# Patient Record
Sex: Female | Born: 1940 | Race: Black or African American | Hispanic: No | Smoking: Former smoker
Health system: Southern US, Community
[De-identification: ages and names within clinical notes are randomized; demographics above are authoritative.]

## PROBLEM LIST (undated history)

## (undated) DIAGNOSIS — C3490 Malignant neoplasm of unspecified part of unspecified bronchus or lung: Secondary | ICD-10-CM

## (undated) DIAGNOSIS — I272 Pulmonary hypertension, unspecified: Secondary | ICD-10-CM

## (undated) DIAGNOSIS — C55 Malignant neoplasm of uterus, part unspecified: Secondary | ICD-10-CM

## (undated) DIAGNOSIS — E119 Type 2 diabetes mellitus without complications: Secondary | ICD-10-CM

## (undated) DIAGNOSIS — J449 Chronic obstructive pulmonary disease, unspecified: Secondary | ICD-10-CM

## (undated) DIAGNOSIS — K5792 Diverticulitis of intestine, part unspecified, without perforation or abscess without bleeding: Secondary | ICD-10-CM

## (undated) DIAGNOSIS — K219 Gastro-esophageal reflux disease without esophagitis: Secondary | ICD-10-CM

## (undated) DIAGNOSIS — E7800 Pure hypercholesterolemia, unspecified: Secondary | ICD-10-CM

## (undated) DIAGNOSIS — I509 Heart failure, unspecified: Secondary | ICD-10-CM

## (undated) DIAGNOSIS — R0600 Dyspnea, unspecified: Secondary | ICD-10-CM

## (undated) HISTORY — PX: FIBEROPTIC BRONCHOSCOPY: SHX5367

## (undated) HISTORY — PX: ABDOMINAL HYSTERECTOMY: SHX81

## (undated) HISTORY — PX: COLONOSCOPY: SHX174

## (undated) HISTORY — DX: Type 2 diabetes mellitus without complications: E11.9

## (undated) HISTORY — DX: Malignant neoplasm of uterus, part unspecified: C55

## (undated) HISTORY — DX: Chronic obstructive pulmonary disease, unspecified: J44.9

## (undated) HISTORY — DX: Malignant neoplasm of unspecified part of unspecified bronchus or lung: C34.90

## (undated) HISTORY — PX: OTHER SURGICAL HISTORY: SHX169

## (undated) HISTORY — DX: Pure hypercholesterolemia, unspecified: E78.00

## (undated) HISTORY — DX: Diverticulitis of intestine, part unspecified, without perforation or abscess without bleeding: K57.92

## (undated) HISTORY — DX: Heart failure, unspecified: I50.9

## (undated) HISTORY — DX: Pulmonary hypertension, unspecified: I27.20

---
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
## 2011-02-05 DIAGNOSIS — I498 Other specified cardiac arrhythmias: Secondary | ICD-10-CM | POA: Insufficient documentation

## 2014-08-06 DIAGNOSIS — C549 Malignant neoplasm of corpus uteri, unspecified: Secondary | ICD-10-CM | POA: Insufficient documentation

## 2016-05-24 DIAGNOSIS — M8580 Other specified disorders of bone density and structure, unspecified site: Secondary | ICD-10-CM | POA: Insufficient documentation

## 2016-06-06 DIAGNOSIS — M7061 Trochanteric bursitis, right hip: Secondary | ICD-10-CM | POA: Insufficient documentation

## 2016-08-05 DIAGNOSIS — R202 Paresthesia of skin: Secondary | ICD-10-CM | POA: Insufficient documentation

## 2016-09-06 HISTORY — PX: OTHER SURGICAL HISTORY: SHX169

## 2016-10-21 DIAGNOSIS — H269 Unspecified cataract: Secondary | ICD-10-CM | POA: Insufficient documentation

## 2016-11-02 DIAGNOSIS — E278 Other specified disorders of adrenal gland: Secondary | ICD-10-CM | POA: Insufficient documentation

## 2016-11-18 DIAGNOSIS — D3500 Benign neoplasm of unspecified adrenal gland: Secondary | ICD-10-CM | POA: Insufficient documentation

## 2016-11-18 DIAGNOSIS — E042 Nontoxic multinodular goiter: Secondary | ICD-10-CM | POA: Insufficient documentation

## 2016-12-07 DIAGNOSIS — E079 Disorder of thyroid, unspecified: Secondary | ICD-10-CM | POA: Insufficient documentation

## 2017-08-23 LAB — HM DEXA SCAN

## 2018-03-17 LAB — HM MAMMOGRAPHY

## 2018-03-20 ENCOUNTER — Encounter: Payer: Self-pay | Admitting: Oncology

## 2018-03-20 ENCOUNTER — Encounter: Payer: Self-pay | Admitting: *Deleted

## 2018-03-21 ENCOUNTER — Other Ambulatory Visit: Payer: Self-pay

## 2018-03-21 ENCOUNTER — Encounter: Payer: Self-pay | Admitting: Oncology

## 2018-03-21 ENCOUNTER — Inpatient Hospital Stay: Payer: Medicare Other | Attending: Oncology | Admitting: Oncology

## 2018-03-21 ENCOUNTER — Encounter (INDEPENDENT_AMBULATORY_CARE_PROVIDER_SITE_OTHER): Payer: Self-pay

## 2018-03-21 VITALS — BP 144/78 | HR 80 | Temp 98.2°F | Resp 18 | Ht 63.5 in | Wt 165.8 lb

## 2018-03-21 DIAGNOSIS — Z85118 Personal history of other malignant neoplasm of bronchus and lung: Secondary | ICD-10-CM | POA: Diagnosis not present

## 2018-03-21 DIAGNOSIS — Z79899 Other long term (current) drug therapy: Secondary | ICD-10-CM | POA: Insufficient documentation

## 2018-03-21 DIAGNOSIS — Z8542 Personal history of malignant neoplasm of other parts of uterus: Secondary | ICD-10-CM

## 2018-03-21 DIAGNOSIS — J449 Chronic obstructive pulmonary disease, unspecified: Secondary | ICD-10-CM | POA: Insufficient documentation

## 2018-03-21 DIAGNOSIS — I272 Pulmonary hypertension, unspecified: Secondary | ICD-10-CM | POA: Diagnosis not present

## 2018-03-21 DIAGNOSIS — Z7984 Long term (current) use of oral hypoglycemic drugs: Secondary | ICD-10-CM | POA: Diagnosis not present

## 2018-03-21 DIAGNOSIS — E119 Type 2 diabetes mellitus without complications: Secondary | ICD-10-CM | POA: Diagnosis not present

## 2018-03-21 DIAGNOSIS — K5790 Diverticulosis of intestine, part unspecified, without perforation or abscess without bleeding: Secondary | ICD-10-CM | POA: Diagnosis not present

## 2018-03-21 DIAGNOSIS — E7800 Pure hypercholesterolemia, unspecified: Secondary | ICD-10-CM

## 2018-03-21 DIAGNOSIS — Z87891 Personal history of nicotine dependence: Secondary | ICD-10-CM | POA: Diagnosis not present

## 2018-03-21 DIAGNOSIS — R911 Solitary pulmonary nodule: Secondary | ICD-10-CM | POA: Diagnosis not present

## 2018-03-21 NOTE — Progress Notes (Signed)
Patient here for initial visit.

## 2018-03-21 NOTE — Progress Notes (Signed)
Hematology/Oncology Consult note [facility] [phone] [phone]   Patient Care Team: Patient, No Pcp Per as PCP - General (General Practice)  REFERRING PROVIDER: Dr.Edward Shaefer CHIEF COMPLAINTS/REASON FOR VISIT:  Evaluation of lung nodule and history of lung cancer.    HISTORY OF PRESENTING ILLNESS:  Brianna Adams is a  77 y.o.  female with PMH listed below who was referred to me for evaluation of.Lung nodule. Extensive review of patient's medical records that was faxed from [employer] 's office in [place]. 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  Patient reports that she did not receive any postoperative radiation therapy or chemotherapy. Patient follows up with pulmonologist Dr. Jeanella Cara in [place]. On 09/07/2017, patient had chest CT done which showed a new 4.3 mm nodule in the left upper lobe. Patient had a repeat CT scan on 02/10/2018 which showed a 1 cm nodule within the left upper lobe.  The nodule has increased in size comparing to previous study.  Patient had a PET scan done  Patient had a PET scan done in [place] on March 03, 2018 which showed hypermetabolic activity noted within the 1 cm nodule in the left upper lobe suspicious for malignancy.  Differential diagnosis includes primary neoplasm versus metastasis.  Status post resection of the right lower lobe nodule.  Diverticulosis COPD.  Patient has had pulmonary function study done in [place] on September 23, 2017. FEV-1/FVC percentage 84% which is normal and rule out airflow obstruction.  FEV1 1.72 LOr 121% of predicted FVC 2.05 L or 110% of predicted. Total lung capacity TLC 4.16 L or 98% of predicted. Residual volume 1.64 L or 92% of predicted Functional residual capacity 2.87 L or 125% of predicted DLCO  9.2 or 50% of predicted   Patient reports prior smoking history.  Quit smoking February 2018.  Previously smoked 1-1.5 PPD since 1959. Chronic mild shortness of breath.  Patient's daughter lives in [place] and patient would like to proceed radiation treatment close to daughter so daughter will take care of her.  She thought she is going to see Radiation Oncologist today as Dr.Shaefer gave her an impression that she will be treated with radiation.    Uterine cancer was listed as past medical history. She had abdominal hystectomy in 2016  Review of Systems  Constitutional: Negative for chills, fever, malaise/fatigue and weight loss.  HENT: Negative for nosebleeds and sore throat.   Eyes: Negative for double vision, photophobia and redness.  Respiratory: Positive for shortness of breath. Negative for cough and wheezing.   Cardiovascular: Negative for chest pain, palpitations and orthopnea.  Gastrointestinal: Negative for abdominal pain, blood in stool, nausea and vomiting.  Genitourinary: Negative for dysuria.  Musculoskeletal: Negative for back pain, myalgias and neck pain.  Skin: Negative for itching and rash.  Neurological: Negative for dizziness, tingling and tremors.  Endo/Heme/Allergies: Negative for environmental allergies. Does not bruise/bleed easily.  Psychiatric/Behavioral: Negative for depression.    MEDICAL HISTORY:  Past Medical History:  Diagnosis Date  . COPD (chronic obstructive pulmonary disease) ([place])   . Diabetes ([place])   . Diverticulitis   . High cholesterol   . Lung cancer ([place])   . Pulmonary hypertension ([place])   . Uterine cancer [facility])     SURGICAL HISTORY: Past Surgical History:  Procedure Laterality Date  .  ABDOMINAL HYSTERECTOMY    . cataract surgery  2012 and 2017  . FIBEROPTIC BRONCHOSCOPY    . resection of lung cancer  2018  . right lower lobe non-anatomocal lung resection wedge    . right thorascoscopy      SOCIAL HISTORY: Social  History   Socioeconomic History  . Marital status: Widowed    Spouse name: Not on file  . Number of children: Not on file  . Years of education: Not on file  . Highest education level: Not on file  Occupational History  . Occupation: retired  Scientific laboratory technician  . Financial resource strain: Not on file  . Food insecurity:    Worry: Not on file    Inability: Not on file  . Transportation needs:    Medical: Not on file    Non-medical: Not on file  Tobacco Use  . Smoking status: Former Smoker    Packs/day: 1.00    Years: 58.00    Pack years: 58.00    Types: Cigarettes    Last attempt to quit: 10/22/2015    Years since quitting: 2.4  . Smokeless tobacco: Never Used  Substance and Sexual Activity  . Alcohol use: Never    Frequency: Never  . Drug use: Never  . Sexual activity: Not on file  Lifestyle  . Physical activity:    Days per week: Not on file    Minutes per session: Not on file  . Stress: Not on file  Relationships  . Social connections:    Talks on phone: Not on file    Gets together: Not on file    Attends religious service: Not on file    Active member of club or organization: Not on file    Attends meetings of clubs or organizations: Not on file    Relationship status: Not on file  . Intimate partner violence:    Fear of current or ex partner: Not on file    Emotionally abused: Not on file    Physically abused: Not on file    Forced sexual activity: Not on file  Other Topics Concern  . Not on file  Social History Narrative  . Not on file    FAMILY HISTORY: Family History  Problem Relation Age of Onset  . Hypertension Mother   . Heart attack Father   . Cancer Maternal Aunt     ALLERGIES:  has no allergies on file.  MEDICATIONS:  Current Outpatient Medications  Medication Sig Dispense Refill  . acetaminophen (TYLENOL) 325 MG tablet Take 325 mg by mouth every 6 (six) hours as needed.    Marland Kitchen albuterol (VENTOLIN HFA) 108 (90 Base) MCG/ACT inhaler Inhale 2  puffs into the lungs every 4 (four) hours as needed for wheezing or shortness of breath.    Marland Kitchen CALCIUM PO Take 1,250 mg by mouth.    Marland Kitchen CINNAMON PO Take 1,000 mg by mouth.    Marland Kitchen glimepiride (AMARYL) 2 MG tablet Take 2 mg by mouth daily with breakfast.    . glucose blood test strip Contour Next Test Strips  Take 1 strip every day by miscell. route.    . Multiple Vitamins-Minerals (CENTRUM SILVER PO) Take by mouth.    . pravastatin (PRAVACHOL) 80 MG tablet Take 80 mg by mouth daily.    . sitaGLIPtin-metformin (JANUMET) 50-1000 MG tablet Take 1 tablet by mouth 2 (two) times daily with a meal.    . tiotropium (SPIRIVA HANDIHALER) 18 MCG inhalation capsule Spiriva with HandiHaler  18 mcg and inhalation capsules    . vitamin C (ASCORBIC ACID) 500 MG tablet Take 500 mg by mouth daily.     No current facility-administered medications for this visit.      PHYSICAL EXAMINATION: ECOG PERFORMANCE STATUS: 1 - Symptomatic but completely ambulatory Vitals:   03/21/18 1121  BP: (!) 144/78  Pulse: 80  Resp: 18  Temp: 98.2 F (36.8 C)   Filed Weights   03/21/18 1121  Weight: 165 lb 12.8 oz (75.2 kg)    Physical Exam  Constitutional: She is oriented to person, place, and time. She appears well-developed and well-nourished. No distress.  HENT:  Head: Normocephalic and atraumatic.  Mouth/Throat: Oropharynx is clear and moist.  Eyes: Pupils are equal, round, and reactive to light. Conjunctivae and EOM are normal. No scleral icterus.  Neck: Normal range of motion. Neck supple.  Cardiovascular: Normal rate, regular rhythm and normal heart sounds.  Pulmonary/Chest: Effort normal. No respiratory distress. She has no wheezes. She has no rales. She exhibits no tenderness.  Decreased breath sounds bilaterally.   Abdominal: Soft. Bowel sounds are normal. She exhibits no distension and no mass. There is no tenderness.  Musculoskeletal: Normal range of motion. She exhibits no edema or deformity.    Lymphadenopathy:    She has no cervical adenopathy.  Neurological: She is alert and oriented to person, place, and time. No cranial nerve deficit. Coordination normal.  Skin: Skin is warm and dry. No rash noted.  Psychiatric: She has a normal mood and affect. Her behavior is normal.     LABORATORY DATA:  I have reviewed the data as listed No results found for: WBC, HGB, HCT, MCV, PLT No results for input(s): NA, K, CL, CO2, GLUCOSE, BUN, CREATININE, CALCIUM, GFRNONAA, GFRAA, PROT, ALBUMIN, AST, ALT, ALKPHOS, BILITOT, BILIDIR, IBILI in the last 8760 hours. Iron/TIBC/Ferritin/ %Sat No results found for: IRON, TIBC, FERRITIN, IRONPCTSAT      ASSESSMENT & PLAN:  1. Lung nodule   2. History of lung cancer    # History of pT2 pN0 squamous lung cancer status post en bloc wedge resection. New lung nodule which is hypermetabolic on PET scan, concerning for a new primary or metastasis of her previous squamous cell lung carcinoma.  Per patient, she has requested images to be sent to [place] as well. Discussed with patient that I recommend lung biopsy if feasible to establish pathology diagnosShe should also be evaluated by thoracic surgeon to see if she is a candidate for resection. Patient tells me that she has decided to proceed with radiation based on her discussion with Dr. Kaleen Mask.  She request to be seen by radiation oncologist for evaluation.  Patient plans to receive radiation here and return to [place] after she completes treatment.   Will refer. We will discuss patient's case on tumor board.  Orders Placed This Encounter  Procedures  . Ambulatory referral to Radiation Oncology    Referral Priority:   Routine    Referral Type:   Consultation    Referral Reason:   Specialty Services Required    Referred to Provider:   Noreene Filbert, MD    Requested Specialty:   Radiation Oncology    Number of Visits Requested:   1    All questions were answered. The patient knows to call  the clinic with any problems questions or concerns.  Return of visit: As needed Total face to face encounter time for this patient visit was 60mn. >50% of the time  was  spent in counseling and coordination of care.    Earlie Server, MD, PhD Hematology Oncology [facility] at [facility] Pager- [phone] 03/21/2018

## 2018-03-29 ENCOUNTER — Encounter: Payer: Self-pay | Admitting: Radiation Oncology

## 2018-03-29 ENCOUNTER — Other Ambulatory Visit: Payer: Self-pay

## 2018-03-29 ENCOUNTER — Ambulatory Visit
Admission: RE | Admit: 2018-03-29 | Discharge: 2018-03-29 | Disposition: A | Discharge status: Home / Self Care | Payer: Medicare Other | Source: Ambulatory Visit | Attending: Radiation Oncology | Admitting: Radiation Oncology

## 2018-03-29 VITALS — BP 128/69 | HR 97 | Temp 97.5°F | Resp 20 | Wt 161.7 lb

## 2018-03-29 DIAGNOSIS — C3431 Malignant neoplasm of lower lobe, right bronchus or lung: Secondary | ICD-10-CM | POA: Insufficient documentation

## 2018-03-29 DIAGNOSIS — K5792 Diverticulitis of intestine, part unspecified, without perforation or abscess without bleeding: Secondary | ICD-10-CM | POA: Insufficient documentation

## 2018-03-29 DIAGNOSIS — E119 Type 2 diabetes mellitus without complications: Secondary | ICD-10-CM | POA: Insufficient documentation

## 2018-03-29 DIAGNOSIS — E7800 Pure hypercholesterolemia, unspecified: Secondary | ICD-10-CM | POA: Diagnosis not present

## 2018-03-29 DIAGNOSIS — R918 Other nonspecific abnormal finding of lung field: Secondary | ICD-10-CM | POA: Insufficient documentation

## 2018-03-29 DIAGNOSIS — R911 Solitary pulmonary nodule: Secondary | ICD-10-CM

## 2018-03-29 DIAGNOSIS — I272 Pulmonary hypertension, unspecified: Secondary | ICD-10-CM | POA: Insufficient documentation

## 2018-03-29 DIAGNOSIS — Z902 Acquired absence of lung [part of]: Secondary | ICD-10-CM | POA: Diagnosis not present

## 2018-03-29 DIAGNOSIS — J449 Chronic obstructive pulmonary disease, unspecified: Secondary | ICD-10-CM | POA: Insufficient documentation

## 2018-03-29 DIAGNOSIS — Z7984 Long term (current) use of oral hypoglycemic drugs: Secondary | ICD-10-CM | POA: Diagnosis not present

## 2018-03-29 DIAGNOSIS — Z8542 Personal history of malignant neoplasm of other parts of uterus: Secondary | ICD-10-CM | POA: Diagnosis not present

## 2018-03-29 DIAGNOSIS — Z880 Allergy status to penicillin: Secondary | ICD-10-CM | POA: Insufficient documentation

## 2018-03-29 DIAGNOSIS — Z79899 Other long term (current) drug therapy: Secondary | ICD-10-CM | POA: Diagnosis not present

## 2018-03-29 DIAGNOSIS — Z87891 Personal history of nicotine dependence: Secondary | ICD-10-CM | POA: Insufficient documentation

## 2018-03-29 NOTE — Consult Note (Signed)
NEW PATIENT EVALUATION  Name: Brianna Adams  MRN: [id number]  Date:   03/29/2018     DOB: 1941-04-09   This 77 y.o. female patient presents to the clinic for initial evaluation of rprobable new non-small cell lung cancer ofof the left upper lobe.  REFERRING PHYSICIAN: No ref. provider found  CHIEF COMPLAINT:  Chief Complaint  Patient presents with  . Lung Cancer    Pt is here for initial consultation of lung nodule    DIAGNOSIS: The encounter diagnosis was Lung nodule.   PREVIOUS INVESTIGATIONS:  PET CT scan requested for my review Pathology reports reviewed Clinical notes reviewed  HPI: patient is a3 year old female status post right middle lobe and right lower lobe stage TII N0 M0 squamous cell carcinoma. She had an en bloc wedge resection at the [facility] in May 2018. She did not receive adjuvant treatment. On follow-up she presented with a 4.3 mm nodule in left upper lobe which has grown over the past 6 months now a 1 cm nodule which is hypermetabolic on PET CT scan.she has palmar function tests from January 2019 showing FEV1 of 84% of predicted. She's been seen by medical oncology is now referred to radiation oncology for consideration of treatment. She is asymptomatic specifically denies cough hemoptysis or chest tightness. She's having no chest pain.  PLANNED TREATMENT REGIMEN: SB RT  PAST MEDICAL HISTORY:  has a past medical history of COPD (chronic obstructive pulmonary disease) ([place]), Diabetes ([place]), Diverticulitis, High cholesterol, Lung cancer ([place]), Pulmonary hypertension ([place]), and Uterine cancer ([place]).    PAST SURGICAL HISTORY:  Past Surgical History:  Procedure Laterality Date  . ABDOMINAL HYSTERECTOMY    . cataract surgery  2012 and 2017  . FIBEROPTIC BRONCHOSCOPY    . resection of lung cancer  2018  . right lower lobe non-anatomocal lung resection wedge    . right thorascoscopy      FAMILY HISTORY: family history includes Cancer in her maternal  aunt; Heart attack in her father; Hypertension in her mother.  SOCIAL HISTORY:  reports that she quit smoking about 2 years ago. Her smoking use included cigarettes. She has a 58.00 pack-year smoking history. She has never used smokeless tobacco. She reports that she does not drink alcohol or use drugs.  ALLERGIES: Ace inhibitors; Atorvastatin; Cortisone; Hydrocortisone; and Penicillins  MEDICATIONS:  Current Outpatient Medications  Medication Sig Dispense Refill  . acetaminophen (TYLENOL) 325 MG tablet Take 325 mg by mouth every 6 (six) hours as needed.    Marland Kitchen albuterol (VENTOLIN HFA) 108 (90 Base) MCG/ACT inhaler Inhale 2 puffs into the lungs every 4 (four) hours as needed for wheezing or shortness of breath.    Marland Kitchen CALCIUM PO Take 1,250 mg by mouth.    Marland Kitchen CINNAMON PO Take 1,000 mg by mouth.    Marland Kitchen glimepiride (AMARYL) 2 MG tablet Take 2 mg by mouth daily with breakfast.    . glucose blood test strip Contour Next Test Strips  Take 1 strip every day by miscell. route.    . Multiple Vitamins-Minerals (CENTRUM SILVER PO) Take by mouth.    . pravastatin (PRAVACHOL) 80 MG tablet Take 80 mg by mouth daily.    . sitaGLIPtin-metformin (JANUMET) 50-1000 MG tablet Take 1 tablet by mouth 2 (two) times daily with a meal.    . tiotropium (SPIRIVA HANDIHALER) 18 MCG inhalation capsule Spiriva with HandiHaler 18 mcg and inhalation capsules    . vitamin C (ASCORBIC ACID) 500 MG tablet Take 500 mg by  mouth daily.     No current facility-administered medications for this encounter.     ECOG PERFORMANCE STATUS:  0 - Asymptomatic  REVIEW OF SYSTEMS:  Patient denies any weight loss, fatigue, weakness, fever, chills or night sweats. Patient denies any loss of vision, blurred vision. Patient denies any ringing  of the ears or hearing loss. No irregular heartbeat. Patient denies heart murmur or history of fainting. Patient denies any chest pain or pain radiating to her upper extremities. Patient denies any shortness  of breath, difficulty breathing at night, cough or hemoptysis. Patient denies any swelling in the lower legs. Patient denies any nausea vomiting, vomiting of blood, or coffee ground material in the vomitus. Patient denies any stomach pain. Patient states has had normal bowel movements no significant constipation or diarrhea. Patient denies any dysuria, hematuria or significant nocturia. Patient denies any problems walking, swelling in the joints or loss of balance. Patient denies any skin changes, loss of hair or loss of weight. Patient denies any excessive worrying or anxiety or significant depression. Patient denies any problems with insomnia. Patient denies excessive thirst, polyuria, polydipsia. Patient denies any swollen glands, patient denies easy bruising or easy bleeding. Patient denies any recent infections, allergies or URI. Patient "s visual fields have not changed significantly in recent time.    PHYSICAL EXAM: BP 128/69   Pulse 97   Temp (!) 97.5 F (36.4 C)   Resp 20   Wt 161 lb 11.3 oz (73.4 kg)   BMI 28.20 kg/m  Well-developed well-nourished patient in NAD. HEENT reveals PERLA, EOMI, discs not visualized.  Oral cavity is clear. No oral mucosal lesions are identified. Neck is clear without evidence of cervical or supraclavicular adenopathy. Lungs are clear to A&P. Cardiac examination is essentially unremarkable with regular rate and rhythm without murmur rub or thrill. Abdomen is benign with no organomegaly or masses noted. Motor sensory and DTR levels are equal and symmetric in the upper and lower extremities. Cranial nerves II through XII are grossly intact. Proprioception is intact. No peripheral adenopathy or edema is identified. No motor or sensory levels are noted. Crude visual fields are within normal range.  LABORATORY DATA: pathology reports reviewed    RADIOLOGY RESULTS:PET CT scan results and disc have been requested for my review   IMPRESSION: rnew probable non-small  cell lung cancer of the left upper lobe in patient with en bloc resection for squamous cell carcinoma of the right lung in 2018  PLAN: this time like to review her PET CT scan we'll present her case at our weekly tumor conference. I would plan on delivering 6000 cGy in 5 fractions using SB RT. Based on the progression of disease and hypermetabolic activity do not feel biopsy is indicated. Risks and benefits of treatment including possible cough fatigue and skin reaction all were discussed in detail with the patient and her daughter. I have personally set up and ordered CT simulation in about a week's time toallow me to review her PET CT scan which is being shipped from [place]. Will use 4D study as well as possible PET CT CT fusion study in treatment planning.  I would like to take this opportunity to thank you for allowing me to participate in the care of your patient.Noreene Filbert, MD

## 2018-03-30 ENCOUNTER — Encounter: Payer: Self-pay | Admitting: *Deleted

## 2018-03-30 NOTE — Progress Notes (Signed)
  Oncology Nurse Navigator Documentation  Navigator Location: [facility] (03/30/18 0800) Referral date to RadOnc/MedOnc: 03/14/18 (03/30/18 0800) )Navigator Encounter Type: Initial RadOnc (03/30/18 0800)   Abnormal Finding Date: 03/03/18 (03/30/18 0800)                   Treatment Phase: Pre-Tx/Tx Discussion (03/30/18 0800) Barriers/Navigation Needs: Coordination of Care (03/30/18 0800)   Interventions: Coordination of Care (03/30/18 0800)   Coordination of Care: Appts;Radiology (03/30/18 0800)        Acuity: Level 1 (03/30/18 0800) Acuity Level 1: Initial guidance, education and coordination as needed;Minimal follow up required (03/30/18 0800)  met with patient during initial rad-onc consultation with Dr. Baruch Gouty. All questions answered at the time of visit. Pt informed that are still waiting on getting disc with PET scan images. Reassured patient that will call by the end of the week to have the disc FedEx overnight. Reviewed upcoming appts. Contact info given and instructed to call with any further questions or needs. Pt verbalized understanding.     Time Spent with Patient: 30 (03/30/18 0800)

## 2018-04-11 ENCOUNTER — Other Ambulatory Visit: Payer: Self-pay | Admitting: *Deleted

## 2018-04-11 ENCOUNTER — Ambulatory Visit
Admission: RE | Admit: 2018-04-11 | Discharge: 2018-04-11 | Disposition: A | Discharge status: Home / Self Care | Payer: Medicare Other | Source: Ambulatory Visit | Attending: Radiation Oncology | Admitting: Radiation Oncology

## 2018-04-11 ENCOUNTER — Encounter: Payer: Self-pay | Admitting: *Deleted

## 2018-04-11 ENCOUNTER — Ambulatory Visit
Admission: RE | Admit: 2018-04-11 | Discharge: 2018-04-11 | Disposition: A | Discharge status: Home / Self Care | Payer: Self-pay | Source: Ambulatory Visit | Attending: Oncology | Admitting: Oncology

## 2018-04-11 ENCOUNTER — Other Ambulatory Visit: Payer: Self-pay | Admitting: Oncology

## 2018-04-11 DIAGNOSIS — R911 Solitary pulmonary nodule: Secondary | ICD-10-CM

## 2018-04-11 DIAGNOSIS — Z87891 Personal history of nicotine dependence: Secondary | ICD-10-CM | POA: Insufficient documentation

## 2018-04-11 DIAGNOSIS — Z510 Encounter for antineoplastic radiation therapy: Secondary | ICD-10-CM | POA: Diagnosis not present

## 2018-04-11 DIAGNOSIS — Z85118 Personal history of other malignant neoplasm of bronchus and lung: Secondary | ICD-10-CM | POA: Insufficient documentation

## 2018-04-11 DIAGNOSIS — R918 Other nonspecific abnormal finding of lung field: Secondary | ICD-10-CM | POA: Diagnosis present

## 2018-04-11 DIAGNOSIS — J449 Chronic obstructive pulmonary disease, unspecified: Secondary | ICD-10-CM

## 2018-04-11 NOTE — Progress Notes (Signed)
  Oncology Nurse Navigator Documentation  Navigator Location: [facility] (04/11/18 1300)   )Navigator Encounter Type: Other (04/11/18 1300)                     Patient Visit Type: IAXKPV (04/11/18 1300) Treatment Phase: CT SIM (04/11/18 1300) Barriers/Navigation Needs: Coordination of Care (04/11/18 1300)   Interventions: Coordination of Care (04/11/18 1300)   Coordination of Care: Appts (04/11/18 1300)         met with patient's daughter during CT simulation today. All questions answered at the time of visit. Pt states would like to be referred to pulmonologist. Referral placed per Dr. Baruch Gouty and pt's daughter informed to be expecting phone call with appt details. Instructed to call with any further questions or needs. Pt's daughter verbalized understanding.         Time Spent with Patient: 30 (04/11/18 1300)

## 2018-04-17 DIAGNOSIS — Z510 Encounter for antineoplastic radiation therapy: Secondary | ICD-10-CM | POA: Diagnosis not present

## 2018-04-25 ENCOUNTER — Encounter: Payer: Self-pay | Admitting: *Deleted

## 2018-04-25 ENCOUNTER — Ambulatory Visit
Admission: RE | Admit: 2018-04-25 | Discharge: 2018-04-25 | Disposition: A | Discharge status: Home / Self Care | Payer: Medicare Other | Source: Ambulatory Visit | Attending: Radiation Oncology | Admitting: Radiation Oncology

## 2018-04-25 DIAGNOSIS — Z510 Encounter for antineoplastic radiation therapy: Secondary | ICD-10-CM | POA: Diagnosis not present

## 2018-04-26 ENCOUNTER — Encounter: Payer: Self-pay | Admitting: Pulmonary Disease

## 2018-04-26 ENCOUNTER — Other Ambulatory Visit
Admission: RE | Admit: 2018-04-26 | Discharge: 2018-04-26 | Disposition: A | Discharge status: Home / Self Care | Payer: Medicare Other | Source: Ambulatory Visit | Attending: Gastroenterology | Admitting: Gastroenterology

## 2018-04-26 ENCOUNTER — Other Ambulatory Visit
Admission: RE | Admit: 2018-04-26 | Discharge: 2018-04-26 | Disposition: A | Discharge status: Home / Self Care | Payer: Medicare Other | Source: Ambulatory Visit | Attending: Pulmonary Disease | Admitting: Pulmonary Disease

## 2018-04-26 ENCOUNTER — Ambulatory Visit (INDEPENDENT_AMBULATORY_CARE_PROVIDER_SITE_OTHER): Payer: Medicare Other | Admitting: Pulmonary Disease

## 2018-04-26 VITALS — BP 140/80 | HR 88 | Ht 63.5 in | Wt 162.0 lb

## 2018-04-26 DIAGNOSIS — J439 Emphysema, unspecified: Secondary | ICD-10-CM | POA: Diagnosis not present

## 2018-04-26 DIAGNOSIS — K921 Melena: Secondary | ICD-10-CM | POA: Diagnosis present

## 2018-04-26 DIAGNOSIS — Z902 Acquired absence of lung [part of]: Secondary | ICD-10-CM | POA: Diagnosis not present

## 2018-04-26 DIAGNOSIS — R0600 Dyspnea, unspecified: Secondary | ICD-10-CM

## 2018-04-26 DIAGNOSIS — J449 Chronic obstructive pulmonary disease, unspecified: Secondary | ICD-10-CM | POA: Diagnosis not present

## 2018-04-26 DIAGNOSIS — R059 Cough, unspecified: Secondary | ICD-10-CM

## 2018-04-26 DIAGNOSIS — R0609 Other forms of dyspnea: Secondary | ICD-10-CM | POA: Insufficient documentation

## 2018-04-26 DIAGNOSIS — R05 Cough: Secondary | ICD-10-CM

## 2018-04-26 DIAGNOSIS — Z85118 Personal history of other malignant neoplasm of bronchus and lung: Secondary | ICD-10-CM | POA: Diagnosis not present

## 2018-04-26 LAB — CBC WITH DIFFERENTIAL/PLATELET
Basophils Absolute: 0.1 10*3/uL (ref 0–0.1)
Basophils Relative: 1 %
EOS ABS: 0.2 10*3/uL (ref 0–0.7)
EOS PCT: 4 %
HCT: 35.4 % (ref 35.0–47.0)
Hemoglobin: 11.8 g/dL — ABNORMAL LOW (ref 12.0–16.0)
LYMPHS ABS: 1.8 10*3/uL (ref 1.0–3.6)
Lymphocytes Relative: 30 %
MCH: 27.4 pg (ref 26.0–34.0)
MCHC: 33.4 g/dL (ref 32.0–36.0)
MCV: 81.9 fL (ref 80.0–100.0)
Monocytes Absolute: 0.7 10*3/uL (ref 0.2–0.9)
Monocytes Relative: 11 %
Neutro Abs: 3.4 10*3/uL (ref 1.4–6.5)
Neutrophils Relative %: 54 %
PLATELETS: 268 10*3/uL (ref 150–440)
RBC: 4.33 MIL/uL (ref 3.80–5.20)
RDW: 17.4 % — AB (ref 11.5–14.5)
WBC: 6.2 10*3/uL (ref 3.6–11.0)

## 2018-04-26 LAB — IRON AND TIBC
IRON: 45 ug/dL (ref 28–170)
Saturation Ratios: 10 % — ABNORMAL LOW (ref 10.4–31.8)
TIBC: 447 ug/dL (ref 250–450)
UIBC: 402 ug/dL

## 2018-04-26 LAB — BRAIN NATRIURETIC PEPTIDE: B Natriuretic Peptide: 57 pg/mL (ref 0.0–100.0)

## 2018-04-26 MED ORDER — OMEPRAZOLE 40 MG PO CPDR: 40.0000 mg | DELAYED_RELEASE_CAPSULE | Freq: Every day | ORAL | 10 refills | Status: DC

## 2018-04-26 MED ORDER — ALBUTEROL SULFATE HFA 108 (90 BASE) MCG/ACT IN AERS: 1.0000 | INHALATION_SPRAY | RESPIRATORY_TRACT | 5 refills | Status: DC | PRN

## 2018-04-26 MED ORDER — UMECLIDINIUM-VILANTEROL 62.5-25 MCG/INH IN AEPB: 1.0000 | INHALATION_SPRAY | Freq: Every day | RESPIRATORY_TRACT | 5 refills | Status: DC

## 2018-04-26 NOTE — Patient Instructions (Signed)
For shortness of breath:  Trial of change from Spiriva inhaler to Anoro inhaler.  Stop Spiriva.  Anoro, 1 inhalation daily.  Trial of albuterol rescue inhaler, 1-2 inhalations up to every 4-6 hours as needed for increased shortness of breath, cough, wheezing, chest tightness.  May use albuterol inhaler prior to exercise.  Lung function tests (PFTs) ordered   Blood test today: B natruretic peptide.  This is to screen for congestive heart failure  For cough: Omeprazole (Prilosec) 40 mg daily to be taken in the morning  Follow-up in 4 to 6 weeks if you remain in this area.  If not, contact us to obtain a copy of my note from this encounter to take back with you to your pulmonologist in [place]

## 2018-04-26 NOTE — Progress Notes (Signed)
PULMONARY CONSULT NOTE  Requesting MD/Service: Tasia Catchings Date of initial consultation: 04/26/18 Reason for consultation: History of lung cancer involving RML/RLL, status post resection, LUL lung nodule (PET positive, presumed malignant, XRT recently initiated).  Exertional dyspnea  PT PROFILE: 77 y.o. 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  Underwent en bloc resection in [place].  New finding of LUL nodule, PET positive.  Radiation therapy initiated.  Referred for evaluation of exertional dyspnea.  DATA: PET 04/11/18: Mild emphysematous changes.  Postoperative changes noted.  Hypermetabolic LUL nodule  INTERVAL:  HPI:  As above.  She reports exertional dyspnea with little day-to-day variation.  She denies CP, fever, purulent sputum, hemoptysis, LE edema and calf tenderness.  She does note cough after eating which is mostly nonproductive.  She reports a globus sensation.  She denies nocturnal cough.  She has been prescribed a Spiriva inhaler by her pulmonologist in [place].  She is unable to discern any benefit from this medication.  She has no rescue inhaler.  Per Dr. Collie Siad note, PFTs were performed in [place] in January of this year which failed to demonstrate significant obstruction.  The only major abnormality was decreased DLCO at 50% predicted.  The patient reports that she intends to return to [place] after completion of radiation therapy here.  She has come to [place] to be supported by her daughter while she is undergoing radiation therapy.  Past Medical History:  Diagnosis Date  . COPD (chronic obstructive pulmonary disease) ([place])   . Diabetes ([place])   . Diverticulitis   . High cholesterol   . Lung cancer ([place])   . Pulmonary hypertension ([place])   . Uterine cancer [facility])     Past Surgical History:  Procedure Laterality Date  . ABDOMINAL HYSTERECTOMY    . cataract surgery  2012 and 2017   . FIBEROPTIC BRONCHOSCOPY    . resection of lung cancer  2018  . right lower lobe non-anatomocal lung resection wedge    . right thorascoscopy      MEDICATIONS: I have reviewed all medications and confirmed regimen as documented  Social History   Socioeconomic History  . Marital status: Widowed    Spouse name: Not on file  . Number of children: Not on file  . Years of education: Not on file  . Highest education level: Not on file  Occupational History  . Occupation: retired  Scientific laboratory technician  . Financial resource strain: Not on file  . Food insecurity:    Worry: Not on file    Inability: Not on file  . Transportation needs:    Medical: Not on file    Non-medical: Not on file  Tobacco Use  . Smoking status: Former Smoker    Packs/day: 1.00    Years: 58.00    Pack years: 58.00    Types: Cigarettes    Last attempt to quit: 10/22/2015    Years since quitting: 2.5  . Smokeless tobacco: Never Used  Substance and Sexual Activity  . Alcohol use: Never    Frequency: Never  . Drug use: Never  . Sexual activity: Not on file  Lifestyle  . Physical activity:    Days per week: Not on file    Minutes per session: Not on file  . Stress: Not on file  Relationships  . Social connections:    Talks on phone: Not on file    Gets together: Not on file  Attends religious service: Not on file    Active member of club or organization: Not on file    Attends meetings of clubs or organizations: Not on file    Relationship status: Not on file  . Intimate partner violence:    Fear of current or ex partner: Not on file    Emotionally abused: Not on file    Physically abused: Not on file    Forced sexual activity: Not on file  Other Topics Concern  . Not on file  Social History Narrative  . Not on file    Family History  Problem Relation Age of Onset  . Hypertension Mother   . Heart attack Father   . Cancer Maternal Aunt     ROS: No fever, myalgias/arthralgias, unexplained  weight loss or weight gain No new focal weakness or sensory deficits No otalgia, hearing loss, visual changes, nasal and sinus symptoms, mouth and throat problems No neck pain or adenopathy No abdominal pain, N/V/D, diarrhea, change in bowel pattern No dysuria, change in urinary pattern   Vitals:   04/26/18 0921 04/26/18 0926  BP:  140/80  Pulse:  88  SpO2:  95%  Weight: 162 lb (73.5 kg)   Height: 5' 3.5" (1.613 m)      EXAM:  Gen: WDWN, No overt respiratory distress HEENT: NCAT, sclera white, oropharynx normal Neck: Supple without LAN, thyromegaly, JVD Lungs: breath sounds moderately diminished, percussion normal, no wheezes or other adventitious sounds Cardiovascular: RRR, no murmurs noted Abdomen: Soft, nontender, normal BS Ext: without clubbing, cyanosis, edema Neuro: CNs grossly intact, motor and sensory intact Skin: Limited exam, no lesions noted  DATA:   No flowsheet data found.  CBC Latest Ref Rng & Units 04/26/2018  WBC 3.6 - 11.0 K/uL 6.2  Hemoglobin 12.0 - 16.0 g/dL 11.8(L)  Hematocrit 35.0 - 47.0 % 35.4  Platelets 150 - 440 K/uL 268    CXR: None available for my review.   The CT/PET scan reported above has been personally reviewed by me   IMPRESSION:      1. History of lung cancer  2. Status post lung cancer resection  3. Mild emphysema by CT chest  4. Former smoker  5. DOE (dyspnea on exertion)    6. Postprandial cough   Despite the reportedly normal PFT in January of this year, her history, exam and CT scan findings suggest COPD. Other potential explanations would include CHF, PE.   PLAN:  For shortness of breath:  -Trial of change from Spiriva inhaler to Anoro inhaler, 1 inhalation daily. -Trial of albuterol rescue inhaler, 1-2 inhalations up to every 4-6 hours as needed for increased shortness of breath, cough, wheezing, chest tightness.  May use albuterol inhaler prior to exercise. -Repeat PFTs ordered  -Blood test today: B natruretic  peptide  For cough: -Omeprazole (Prilosec) 40 mg daily to be taken in the morning  We will contact her with results of the above test and schedule follow-up accordingly.  Again, it is noted that she plans to return to [place] after completion of radiation therapy.   Merton Border, MD PCCM service Mobile [phone] Pager [phone] 04/26/2018 3:45 PM

## 2018-04-26 NOTE — Progress Notes (Signed)
  Oncology Nurse Navigator Documentation  Navigator Location: [facility] (04/25/18 1400)   )Navigator Encounter Type: Treatment (04/25/18 1400)                   Treatment Initiated Date: 04/25/18 (04/25/18 1400) Patient Visit Type: IRCVEL (04/25/18 1400) Treatment Phase: First Radiation Tx (04/25/18 1400) Barriers/Navigation Needs: No barriers at this time (04/25/18 1400)   Interventions: None required (04/25/18 1400)                      Time Spent with Patient: 15 (04/25/18 1400)

## 2018-04-27 ENCOUNTER — Ambulatory Visit
Admission: RE | Admit: 2018-04-27 | Discharge: 2018-04-27 | Disposition: A | Discharge status: Home / Self Care | Payer: Medicare Other | Source: Ambulatory Visit | Attending: Radiation Oncology | Admitting: Radiation Oncology

## 2018-04-27 DIAGNOSIS — Z510 Encounter for antineoplastic radiation therapy: Secondary | ICD-10-CM | POA: Diagnosis not present

## 2018-05-02 ENCOUNTER — Ambulatory Visit
Admission: RE | Admit: 2018-05-02 | Discharge: 2018-05-02 | Disposition: A | Discharge status: Home / Self Care | Payer: Medicare Other | Source: Ambulatory Visit | Attending: Radiation Oncology | Admitting: Radiation Oncology

## 2018-05-02 DIAGNOSIS — Z510 Encounter for antineoplastic radiation therapy: Secondary | ICD-10-CM | POA: Diagnosis not present

## 2018-05-04 ENCOUNTER — Ambulatory Visit: Payer: Medicare Other | Attending: Pulmonary Disease

## 2018-05-04 ENCOUNTER — Telehealth: Payer: Self-pay | Admitting: Pulmonary Disease

## 2018-05-04 ENCOUNTER — Ambulatory Visit
Admission: RE | Admit: 2018-05-04 | Discharge: 2018-05-04 | Disposition: A | Discharge status: Home / Self Care | Payer: Medicare Other | Source: Ambulatory Visit | Attending: Radiation Oncology | Admitting: Radiation Oncology

## 2018-05-04 DIAGNOSIS — J438 Other emphysema: Secondary | ICD-10-CM | POA: Diagnosis not present

## 2018-05-04 DIAGNOSIS — J439 Emphysema, unspecified: Secondary | ICD-10-CM

## 2018-05-04 DIAGNOSIS — Z510 Encounter for antineoplastic radiation therapy: Secondary | ICD-10-CM | POA: Diagnosis not present

## 2018-05-04 MED ORDER — ALBUTEROL SULFATE (2.5 MG/3ML) 0.083% IN NEBU
2.5000 mg | INHALATION_SOLUTION | Freq: Once | RESPIRATORY_TRACT | Status: AC
  Administered 2018-05-04: 2.5 mg via RESPIRATORY_TRACT
  Filled 2018-05-04: qty 3

## 2018-05-04 NOTE — Telephone Encounter (Signed)
PFT faxed. Nothing needed

## 2018-05-04 NOTE — Telephone Encounter (Signed)
Pt would like to make sure her Dr. In [place] gets her PFT results.  Dr. Vaughan Browner , MD [phone]

## 2018-05-09 ENCOUNTER — Ambulatory Visit: Payer: Medicare Other

## 2018-05-10 ENCOUNTER — Ambulatory Visit
Admission: RE | Admit: 2018-05-10 | Discharge: 2018-05-10 | Disposition: A | Discharge status: Home / Self Care | Payer: Medicare Other | Source: Ambulatory Visit | Attending: Radiation Oncology | Admitting: Radiation Oncology

## 2018-05-10 DIAGNOSIS — R918 Other nonspecific abnormal finding of lung field: Secondary | ICD-10-CM | POA: Insufficient documentation

## 2018-05-10 DIAGNOSIS — Z85118 Personal history of other malignant neoplasm of bronchus and lung: Secondary | ICD-10-CM | POA: Insufficient documentation

## 2018-05-10 DIAGNOSIS — Z87891 Personal history of nicotine dependence: Secondary | ICD-10-CM | POA: Insufficient documentation

## 2018-05-10 DIAGNOSIS — Z510 Encounter for antineoplastic radiation therapy: Secondary | ICD-10-CM | POA: Diagnosis not present

## 2018-06-14 ENCOUNTER — Ambulatory Visit: Payer: Medicare Other | Admitting: Radiation Oncology

## 2018-06-23 LAB — HM DIABETES EYE EXAM

## 2019-01-01 ENCOUNTER — Other Ambulatory Visit: Payer: Self-pay | Admitting: Pulmonary Disease

## 2019-01-01 NOTE — Telephone Encounter (Signed)
Received Rx request from [facility] for Omeprazole 46m. Pt last seen 04/26/18 and was instructed to f/u in 4-6 weeks.  Rx has been denied at this time, as pt is past due for an appointment.  Nothing further is needed.

## 2019-03-14 ENCOUNTER — Encounter: Payer: Self-pay | Admitting: Nurse Practitioner

## 2019-03-14 ENCOUNTER — Other Ambulatory Visit: Payer: Self-pay

## 2019-03-14 ENCOUNTER — Ambulatory Visit (INDEPENDENT_AMBULATORY_CARE_PROVIDER_SITE_OTHER): Payer: Medicare Other | Admitting: Nurse Practitioner

## 2019-03-14 VITALS — BP 124/72 | HR 87 | Temp 98.2°F | Ht 63.0 in | Wt 170.0 lb

## 2019-03-14 DIAGNOSIS — J449 Chronic obstructive pulmonary disease, unspecified: Secondary | ICD-10-CM | POA: Diagnosis not present

## 2019-03-14 DIAGNOSIS — R2232 Localized swelling, mass and lump, left upper limb: Secondary | ICD-10-CM | POA: Diagnosis not present

## 2019-03-14 DIAGNOSIS — E119 Type 2 diabetes mellitus without complications: Secondary | ICD-10-CM

## 2019-03-14 DIAGNOSIS — Z85118 Personal history of other malignant neoplasm of bronchus and lung: Secondary | ICD-10-CM | POA: Diagnosis not present

## 2019-03-14 DIAGNOSIS — N1831 Chronic kidney disease, stage 3a: Secondary | ICD-10-CM | POA: Insufficient documentation

## 2019-03-14 DIAGNOSIS — Z8542 Personal history of malignant neoplasm of other parts of uterus: Secondary | ICD-10-CM

## 2019-03-14 LAB — POCT UA - MICROALBUMIN
Albumin/Creatinine Ratio, Urine, POC: 300
Creatinine, POC: 50 mg/dL
Microalbumin Ur, POC: 30 mg/L

## 2019-03-14 LAB — POCT URINALYSIS DIPSTICK
Bilirubin, UA: NEGATIVE
Blood, UA: NEGATIVE
Glucose, UA: NEGATIVE
Ketones, UA: NEGATIVE
Leukocytes, UA: NEGATIVE
Nitrite, UA: NEGATIVE
Protein, UA: NEGATIVE
Spec Grav, UA: 1.02 (ref 1.010–1.025)
Urobilinogen, UA: 0.2 E.U./dL
pH, UA: 5.5 (ref 5.0–8.0)

## 2019-03-14 MED ORDER — BREATHERITE VALVED MDI CHAMBER DEVI: 1.0000 | 2 refills | Status: DC | PRN

## 2019-03-14 NOTE — Patient Instructions (Signed)
How to Use a Metered Dose Inhaler A metered dose inhaler is a handheld device for taking medicine that must be breathed into the lungs (inhaled). The device can be used to deliver a variety of inhaled medicines, including:  Quick relief or rescue medicines, such as bronchodilators.  Controller medicines, such as corticosteroids. The medicine is delivered by pushing down on a metal canister to release a preset amount of spray and medicine. Each device contains the amount of medicine that is needed for a preset number of uses (inhalations). Your health care provider may recommend that you use a spacer with your inhaler to help you take the medicine more effectively. A spacer is a plastic tube with a mouthpiece on one end and an opening that connects to the inhaler on the other end. A spacer holds the medicine in a tube for a short time, which allows you to inhale more medicine. What are the risks? If you do not use your inhaler correctly, medicine might not reach your lungs to help you breathe. Inhaler medicine can cause side effects, such as:  Mouth or throat infection.  Cough.  Hoarseness.  Headache.  Nausea and vomiting.  Lung infection (pneumonia) in people who have a lung condition called COPD. How to use a metered dose inhaler without a spacer  1. Remove the cap from the inhaler. 2. If you are using the inhaler for the first time, shake it for 5 seconds, turn it away from your face, then release 4 puffs into the air. This is called priming. 3. Shake the inhaler for 5 seconds. 4. Position the inhaler so the top of the canister faces up. 5. Put your index finger on the top of the medicine canister. Support the bottom of the inhaler with your thumb. 6. Breathe out normally and as completely as possible, away from the inhaler. 7. Either place the inhaler between your teeth and close your lips tightly around the mouthpiece, or hold the inhaler 1-2 inches (2.5-5 cm) away from your open  mouth. Keep your tongue down out of the way. If you are unsure which technique to use, ask your health care provider. 8. Press the canister down with your index finger to release the medicine, then inhale deeply and slowly through your mouth (not your nose) until your lungs are completely filled. Inhaling should take 4-6 seconds. 9. Hold the medicine in your lungs for 5-10 seconds (10 seconds is best). This helps the medicine get into the small airways of your lungs. 10. With your lips in a tight circle (pursed), breathe out slowly. 11. Repeat steps 3-10 until you have taken the number of puffs that your health care provider directed. Wait about 1 minute between puffs or as directed. 12. Put the cap on the inhaler. 13. If you are using a steroid inhaler, rinse your mouth with water, gargle, and spit out the water. Do not swallow the water. How to use a metered dose inhaler with a spacer  1. Remove the cap from the inhaler. 2. If you are using the inhaler for the first time, shake it for 5 seconds, turn it away from your face, then release 4 puffs into the air. This is called priming. 3. Shake the inhaler for 5 seconds. 4. Place the open end of the spacer onto the inhaler mouthpiece. 5. Position the inhaler so the top of the canister faces up and the spacer mouthpiece faces you. 6. Put your index finger on the top of the medicine canister.  Support the bottom of the inhaler and the spacer with your thumb. 7. Breathe out normally and as completely as possible, away from the spacer. 8. Place the spacer between your teeth and close your lips tightly around it. Keep your tongue down out of the way. 9. Press the canister down with your index finger to release the medicine, then inhale deeply and slowly through your mouth (not your nose) until your lungs are completely filled. Inhaling should take 4-6 seconds. 10. Hold the medicine in your lungs for 5-10 seconds (10 seconds is best). This helps the  medicine get into the small airways of your lungs. 11. With your lips in a tight circle (pursed), breathe out slowly. 12. Repeat steps 3-11 until you have taken the number of puffs that your health care provider directed. Wait about 1 minute between puffs or as directed. 13. Remove the spacer from the inhaler and put the cap on the inhaler. 14. If you are using a steroid inhaler, rinse your mouth with water, gargle, and spit out the water. Do not swallow the water. Follow these instructions at home:  Take your inhaled medicine only as told by your health care provider. Do not use the inhaler more than directed by your health care provider.  Keep all follow-up visits as told by your health care provider. This is important.  If your inhaler has a counter, you can check it to determine how full your inhaler is. If your inhaler does not have a counter, ask your health care provider when you will need to refill your inhaler and write the refill date on a calendar or on your inhaler canister. Note that you cannot know when an inhaler is empty by shaking it.  Follow directions on the package insert for care and cleaning of your inhaler and spacer. Contact a health care provider if:  Symptoms are only partially relieved with your inhaler.  You are having trouble using your inhaler.  You have an increase in phlegm.  You have headaches. Get help right away if:  You feel little or no relief after using your inhaler.  You have dizziness.  You have a fast heart rate.  You have chills or a fever.  You have night sweats.  There is blood in your phlegm. Summary  A metered dose inhaler is a handheld device for taking medicine that must be breathed into the lungs (inhaled).  The medicine is delivered by pushing down on a metal canister to release a preset amount of spray and medicine.  Each device contains the amount of medicine that is needed for a preset number of uses (inhalations). This  information is not intended to replace advice given to you by your health care provider. Make sure you discuss any questions you have with your health care provider. Document Released: 08/23/2005 Document Revised: 08/05/2017 Document Reviewed: 07/13/2016 Elsevier Patient Education  2020 [employer].

## 2019-03-14 NOTE — Progress Notes (Signed)
Subjective:     Patient ID: Brianna Adams , female    DOB: 08/04/1941 , 78 y.o.   MRN: [id number]   Chief Complaint  Patient presents with  . Establish Care    patient states she is establishing primary care  . Hyperlipidemia    patient has been taking pravastain 68m and has been having some pains under her left breast old pcp told her to take CoQ10 along with it  . Mass    patient has a mass on her left wrist that appeared about 2 months and has been continuing to grow    HPI  [place] to [place].  [place] and referred.  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  She only has one daughter.  [facility]  [facility]ics.   PMH - smoker for years.  Lung surgery due to cancer of lungs on left side in the last 4-6 years (2018). DM, Hx of uterine cancer (hysterectomy) She does not have an oncologist or pulmonogy.  Opthamologist.  Endocrinologist  [facility] - fairly healthy. Father - MI died  In heis 560's    Hyperlipidemia This is a chronic problem. The current episode started more than 1 year ago.     Past Medical History:  Diagnosis Date  . COPD (chronic obstructive pulmonary disease) ([place]   . Diabetes ([place]   . Diverticulitis   . High cholesterol   . Lung cancer ([place]   . Pulmonary hypertension ([place]   . Uterine cancer ([place]      Family History  Problem Relation Age of Onset  . Hypertension Mother   . Heart attack Father   . Cancer Maternal Aunt      Current Outpatient Medications:  .  CALCIUM PO, Take 1,200 mg by mouth daily at 12 noon. , Disp: , Rfl:  .  CINNAMON PO, Take 2,000 mg by mouth. , Disp: , Rfl:  .  Coenzyme Q10 (COQ10) 100 MG CAPS, Take 1 capsule by mouth daily at 12 noon., Disp: , Rfl:  .  glimepiride (AMARYL) 2 MG tablet, Take 2 mg by mouth daily with breakfast., Disp: , Rfl:  .  Multiple  Vitamins-Minerals (CENTRUM SILVER PO), Take by mouth., Disp: , Rfl:  .  Multiple Vitamins-Minerals (OCUVITE EXTRA PO), Take 1 tablet by mouth daily at 12 noon., Disp: , Rfl:  .  omeprazole (PRILOSEC) 40 MG capsule, Take 1 capsule (40 mg total) by mouth daily., Disp: 30 capsule, Rfl: 10 .  pravastatin (PRAVACHOL) 80 MG tablet, Take 80 mg by mouth daily., Disp: , Rfl:  .  sitaGLIPtin-metformin (JANUMET) 50-1000 MG tablet, Take 1 tablet by mouth 2 (two) times daily with a meal., Disp: , Rfl:  .  tiotropium (SPIRIVA HANDIHALER) 18 MCG inhalation capsule, Spiriva with HandiHaler 18 mcg and inhalation capsules, Disp: , Rfl:  .  vitamin C (ASCORBIC ACID) 500 MG tablet, Take 500 mg by mouth daily. 4 times weekly, Disp: , Rfl:  .  acetaminophen (TYLENOL) 325 MG tablet, Take 325 mg by mouth every 6 (six) hours as needed., Disp: , Rfl:  .  albuterol (VENTOLIN HFA) 108 (90 Base) MCG/ACT inhaler, Inhale 1-2 puffs into the lungs every 4 (four) hours as needed for wheezing or shortness of breath. (Patient not taking: Reported on 03/14/2019), Disp: 1  Inhaler, Rfl: 5   Allergies  Allergen Reactions  . Ace Inhibitors Swelling  . Atorvastatin     Other reaction(s): Myalgias (Muscle Pain) Other reaction(s): Myalgias (muscle pain)  . Cortisone   . Hydrocortisone Other (See Comments)  . Penicillins Hives     Review of Systems  Constitutional: Negative.   Respiratory: Negative.   Cardiovascular: Negative.   Musculoskeletal: Negative.   Neurological: Negative for dizziness.  Psychiatric/Behavioral: Negative.      Today's Vitals   03/14/19 0959  BP: 124/72  Pulse: 87  Temp: 98.2 F (36.8 C)  TempSrc: Oral  Weight: 170 lb (77.1 kg)  Height: 5' 3" (1.6 m)  PainSc: 0-No pain   Body mass index is 30.11 kg/m.   Objective:  Physical Exam Vitals signs reviewed.  Constitutional:      Appearance: Normal appearance.  Cardiovascular:     Rate and Rhythm: Normal rate and regular rhythm.     Pulses:  Normal pulses.     Heart sounds: Normal heart sounds. No murmur.  Pulmonary:     Effort: Pulmonary effort is normal. No respiratory distress.     Breath sounds: Normal breath sounds.  Skin:    General: Skin is warm and dry.     Capillary Refill: Capillary refill takes less than 2 seconds.  Neurological:     General: No focal deficit present.     Mental Status: She is alert and oriented to person, place, and time.  Psychiatric:        Mood and Affect: Mood normal.        Behavior: Behavior normal.        Thought Content: Thought content normal.        Judgment: Judgment normal.         Assessment And Plan:     1. Chronic obstructive pulmonary disease, unspecified COPD type ([place])  Will order an MDI to assist with being able to take a deep breath for her inhalers  I will also refer her to pulmonology for managing as she has seen a pulmonologist in the past - Respiratory Therapy Supplies (BREATHERITE VALVED MDI CHAMBER) DEVI; 1 each by Does not apply route as needed.  Dispense: 1 Device; Refill: 2 - Ambulatory referral to Pulmonology  2. Type 2 diabetes mellitus without complication, without long-term current use of insulin (HCC)  Chronic  Continue with current medications  Encouraged to limit intake of sugary foods and drinks - POCT Urinalysis Dipstick (81002) - POCT UA - Microalbumin  3. History of lung cancer  She has a previous history of lung cancer and would like to get established with a local oncologist - Ambulatory referral to Pulmonology - Ambulatory referral to Oncology  4. History of uterine cancer  Will get her established with a local oncologist - Ambulatory referral to Oncology  5. Mass of left wrist  Firm mass to inner left wrist  Will order an ultrasound to see if this cyst   [name], [name]    THE PATIENT IS ENCOURAGED TO PRACTICE SOCIAL DISTANCING DUE TO THE COVID-19 PANDEMIC.

## 2019-04-10 ENCOUNTER — Telehealth: Payer: Self-pay | Admitting: *Deleted

## 2019-04-10 NOTE — Telephone Encounter (Signed)
Per Brianna Adams to get est pt scheduled to see Dr. Tasia Catchings for a follow up visit  I called patient's daughter Brianna Adams to get her mother scheduled as requested, and  Was told that her mother lives in [place] and will be moving back to [place] sometime In October of 2020. And would call the office back if needed.

## 2019-05-08 ENCOUNTER — Telehealth: Payer: Self-pay

## 2019-05-08 NOTE — Telephone Encounter (Signed)
I called patient daughter and left her a v/m to call the office we wanted to ask her where she was previously treated for her cancers because we need the records. YRL,RMA

## 2019-05-21 ENCOUNTER — Encounter: Payer: Self-pay | Admitting: Internal Medicine

## 2019-06-13 ENCOUNTER — Encounter: Payer: Self-pay | Admitting: Nurse Practitioner

## 2019-06-14 ENCOUNTER — Encounter: Payer: Self-pay | Admitting: Nurse Practitioner

## 2019-06-14 ENCOUNTER — Ambulatory Visit (INDEPENDENT_AMBULATORY_CARE_PROVIDER_SITE_OTHER): Payer: Medicare Other | Admitting: Nurse Practitioner

## 2019-06-14 ENCOUNTER — Other Ambulatory Visit: Payer: Self-pay

## 2019-06-14 VITALS — BP 138/74 | HR 88 | Temp 98.1°F | Ht 61.6 in | Wt 162.0 lb

## 2019-06-14 DIAGNOSIS — Z9981 Dependence on supplemental oxygen: Secondary | ICD-10-CM

## 2019-06-14 DIAGNOSIS — Z760 Encounter for issue of repeat prescription: Secondary | ICD-10-CM | POA: Diagnosis not present

## 2019-06-14 DIAGNOSIS — J449 Chronic obstructive pulmonary disease, unspecified: Secondary | ICD-10-CM | POA: Diagnosis not present

## 2019-06-14 DIAGNOSIS — E119 Type 2 diabetes mellitus without complications: Secondary | ICD-10-CM | POA: Diagnosis not present

## 2019-06-14 MED ORDER — OMEPRAZOLE 40 MG PO CPDR: 40.0000 mg | DELAYED_RELEASE_CAPSULE | Freq: Every day | ORAL | 0 refills | Status: DC

## 2019-06-14 MED ORDER — JANUMET XR 100-1000 MG PO TB24: 1.0000 | ORAL_TABLET | Freq: Two times a day (BID) | ORAL | 1 refills | Status: DC

## 2019-06-14 MED ORDER — PRAVASTATIN SODIUM 10 MG PO TABS: 10.0000 mg | ORAL_TABLET | Freq: Every day | ORAL | 0 refills | Status: DC

## 2019-06-14 MED ORDER — GLIMEPIRIDE 2 MG PO TABS: 2.0000 mg | ORAL_TABLET | Freq: Every day | ORAL | 0 refills | Status: DC

## 2019-06-14 NOTE — Progress Notes (Signed)
Subjective:     Patient ID: Brianna Adams , female    DOB: [birth year] , 78 y.o.   MRN: [id number]   Chief Complaint  Patient presents with  . Diabetes  . Medication Refill    patient needs a prescription to get her oxygen she uses it at night    HPI  She has been using oxygen at home for about 2 years.  Uses at night.  [facility] in [place].  Brianna Adams (daughter) will call back with the number for the oxygen service here in [place].  She feels is at 2 l/m.  She is using sometimes during the day with walking and moving around.  She is walking 3 times per week will carry the portable.    She has just got to [place] but have not moved officially.  Will not be going to see specialist until at least December.   Diabetes She presents for her follow-up diabetic visit. She has type 2 diabetes mellitus. Pertinent negatives for hypoglycemia include no dizziness or headaches. Pertinent negatives for diabetes include no chest pain. There are no hypoglycemic complications. There are no diabetic complications. Risk factors for coronary artery disease include hypertension and obesity. She is compliant with treatment all of the time.  Medication Refill Pertinent negatives include no chest pain or headaches.     Past Medical History:  Diagnosis Date  . COPD (chronic obstructive pulmonary disease) ([place])   . Diabetes ([place])   . Diverticulitis   . High cholesterol   . Lung cancer ([place])   . Pulmonary hypertension ([place])   . Uterine cancer ([place])      Family History  Problem Relation Age of Onset  . Hypertension Mother   . Heart attack Father   . Cancer Maternal Aunt      Current Outpatient Medications:  .  acetaminophen (TYLENOL) 325 MG tablet, Take 325 mg by mouth every 6 (six) hours as needed., Disp: , Rfl:  .  CALCIUM PO, Take 1,200 mg by mouth daily at 12 noon. , Disp: , Rfl:  .  CINNAMON PO, Take 2,000 mg by mouth. , Disp: , Rfl:  .  glimepiride (AMARYL) 2 MG tablet, Take 2  mg by mouth daily with breakfast., Disp: , Rfl:  .  Multiple Vitamins-Minerals (CENTRUM SILVER PO), Take by mouth., Disp: , Rfl:  .  Multiple Vitamins-Minerals (OCUVITE EXTRA PO), Take 1 tablet by mouth daily at 12 noon., Disp: , Rfl:  .  omeprazole (PRILOSEC) 40 MG capsule, Take 1 capsule (40 mg total) by mouth daily., Disp: 30 capsule, Rfl: 10 .  pravastatin (PRAVACHOL) 10 MG tablet, Take 10 mg by mouth daily. , Disp: , Rfl:  .  Respiratory Therapy Supplies (BREATHERITE VALVED MDI CHAMBER) DEVI, 1 each by Does not apply route as needed., Disp: 1 Device, Rfl: 2 .  sitaGLIPtin-metformin (JANUMET) 50-1000 MG tablet, Take 1 tablet by mouth 2 (two) times daily with a meal., Disp: , Rfl:  .  tiotropium (SPIRIVA HANDIHALER) 18 MCG inhalation capsule, Spiriva with HandiHaler 18 mcg and inhalation capsules, Disp: , Rfl:  .  vitamin C (ASCORBIC ACID) 500 MG tablet, Take 500 mg by mouth daily. 4 times weekly, Disp: , Rfl:  .  albuterol (VENTOLIN HFA) 108 (90 Base) MCG/ACT inhaler, Inhale 1-2 puffs into the lungs every 4 (four) hours as needed for wheezing or shortness of breath. (Patient not taking: Reported on 03/14/2019), Disp: 1 Inhaler, Rfl: 5 .  Coenzyme Q10 (COQ10) 100 MG  CAPS, Take 1 capsule by mouth daily at 12 noon., Disp: , Rfl:    Allergies  Allergen Reactions  . Ace Inhibitors Swelling  . Atorvastatin     Other reaction(s): Myalgias (Muscle Pain) Other reaction(s): Myalgias (muscle pain)  . Cortisone   . Hydrocortisone Other (See Comments)  . Penicillins Hives     Review of Systems  Constitutional: Negative.   Respiratory: Negative.   Cardiovascular: Negative.  Negative for chest pain, palpitations and leg swelling.  Musculoskeletal: Negative.   Skin: Negative.   Neurological: Negative for dizziness and headaches.  Psychiatric/Behavioral: Negative.      Today's Vitals   06/14/19 1037  BP: 138/74  Pulse: 88  Temp: 98.1 F (36.7 C)  TempSrc: Oral  Weight: 162 lb (73.5 kg)   Height: 5' 1.6" (1.565 m)  PainSc: 0-No pain   Body mass index is 30.02 kg/m.   Objective:  Physical Exam Vitals signs reviewed.  Constitutional:      Appearance: Normal appearance.  Cardiovascular:     Rate and Rhythm: Normal rate and regular rhythm.     Pulses: Normal pulses.     Heart sounds: Normal heart sounds. No murmur.  Pulmonary:     Effort: Pulmonary effort is normal. No respiratory distress.  Skin:    Capillary Refill: Capillary refill takes less than 2 seconds.  Neurological:     General: No focal deficit present.     Mental Status: She is alert and oriented to person, place, and time.  Psychiatric:        Mood and Affect: Mood normal.        Behavior: Behavior normal.        Thought Content: Thought content normal.        Judgment: Judgment normal.         Assessment And Plan:     1. Type 2 diabetes mellitus without complication, without long-term current use of insulin (HCC)  Chronic, stable.   Continue with current medications  2. Chronic obstructive pulmonary disease, unspecified COPD type ([place])  Stable.    3. On supplemental oxygen therapy  She has been using supplemental oxygen for several years at 2 l/m  Needs a new concentrator and portable oxygen due to relocation.    I will order new oxygen from [facility].     Brianna Brine, [name]    THE PATIENT IS ENCOURAGED TO PRACTICE SOCIAL DISTANCING DUE TO THE COVID-19 PANDEMIC.

## 2019-06-15 LAB — HEMOGLOBIN A1C
Est. average glucose Bld gHb Est-mCnc: 134 mg/dL
Hgb A1c MFr Bld: 6.3 % — ABNORMAL HIGH (ref 4.8–5.6)

## 2019-06-19 ENCOUNTER — Encounter: Payer: Self-pay | Admitting: Nurse Practitioner

## 2019-06-25 ENCOUNTER — Telehealth: Payer: Self-pay | Admitting: Nurse Practitioner

## 2019-06-25 ENCOUNTER — Telehealth: Payer: Self-pay

## 2019-06-25 NOTE — Telephone Encounter (Signed)
Called daughter Ariya Bohannon to inform about the oxygen unable to leave a voicemail due to full mailbox. We will try to call her back tomorrow

## 2019-06-25 NOTE — Telephone Encounter (Signed)
Daughter called asking if the information has been put together for the pt the receive a prescription for her oxygen machine daughter [phone] Azora Bonzo  In the process of moving next wk need to secure the patient a machine before moving would like a call back regarding this information ASAP

## 2019-06-27 ENCOUNTER — Ambulatory Visit: Payer: Medicare Other

## 2019-06-27 ENCOUNTER — Other Ambulatory Visit: Payer: Self-pay

## 2019-06-27 VITALS — BP 136/80 | HR 102 | Temp 98.3°F | Ht 61.6 in | Wt 162.0 lb

## 2019-06-27 DIAGNOSIS — Z9981 Dependence on supplemental oxygen: Secondary | ICD-10-CM

## 2019-06-27 DIAGNOSIS — J449 Chronic obstructive pulmonary disease, unspecified: Secondary | ICD-10-CM

## 2019-06-27 NOTE — Progress Notes (Addendum)
Patient presented today for a 6 minute walk test.

## 2019-07-08 ENCOUNTER — Other Ambulatory Visit: Payer: Self-pay | Admitting: Nurse Practitioner

## 2019-07-08 DIAGNOSIS — E119 Type 2 diabetes mellitus without complications: Secondary | ICD-10-CM

## 2019-08-16 ENCOUNTER — Telehealth: Payer: Self-pay

## 2019-08-16 NOTE — Telephone Encounter (Signed)
Patient called regaring a screening about a growth on her left wrist and she also needs information about her pulmonary health.   I RETURNED HER CALL AND LEFT HER A V/M TO CALL THE OFFICE SHE IS TO CALL BOTH SPECIALIST OFFICES FOR AN APPOINTMENT THE REFERRALS HAVE ALREADY BEEN PLACED IN July. YRL,RMA

## 2019-08-29 ENCOUNTER — Other Ambulatory Visit: Payer: Self-pay | Admitting: Nurse Practitioner

## 2019-08-29 ENCOUNTER — Ambulatory Visit
Admission: RE | Admit: 2019-08-29 | Discharge: 2019-08-29 | Disposition: A | Discharge status: Home / Self Care | Payer: Medicare Other | Source: Ambulatory Visit | Attending: Nurse Practitioner | Admitting: Nurse Practitioner

## 2019-08-29 DIAGNOSIS — R2232 Localized swelling, mass and lump, left upper limb: Secondary | ICD-10-CM

## 2019-09-04 ENCOUNTER — Other Ambulatory Visit: Payer: Self-pay | Admitting: Nurse Practitioner

## 2019-09-04 DIAGNOSIS — M67432 Ganglion, left wrist: Secondary | ICD-10-CM

## 2019-09-04 DIAGNOSIS — R2232 Localized swelling, mass and lump, left upper limb: Secondary | ICD-10-CM

## 2019-09-11 ENCOUNTER — Ambulatory Visit: Payer: Medicare Other

## 2019-09-11 ENCOUNTER — Encounter: Payer: Medicare Other | Admitting: Nurse Practitioner

## 2019-09-13 ENCOUNTER — Ambulatory Visit: Payer: Medicare Other

## 2019-09-13 ENCOUNTER — Ambulatory Visit: Payer: Medicare Other | Admitting: Nurse Practitioner

## 2019-09-18 ENCOUNTER — Other Ambulatory Visit: Payer: Self-pay

## 2019-09-18 ENCOUNTER — Encounter: Payer: Self-pay | Admitting: Pulmonary Disease

## 2019-09-18 ENCOUNTER — Ambulatory Visit (INDEPENDENT_AMBULATORY_CARE_PROVIDER_SITE_OTHER): Payer: Medicare Other | Admitting: Pulmonary Disease

## 2019-09-18 VITALS — BP 128/72 | HR 91 | Temp 97.0°F | Ht 65.0 in | Wt 155.8 lb

## 2019-09-18 DIAGNOSIS — J449 Chronic obstructive pulmonary disease, unspecified: Secondary | ICD-10-CM

## 2019-09-18 DIAGNOSIS — C3490 Malignant neoplasm of unspecified part of unspecified bronchus or lung: Secondary | ICD-10-CM | POA: Diagnosis not present

## 2019-09-18 DIAGNOSIS — R0600 Dyspnea, unspecified: Secondary | ICD-10-CM | POA: Diagnosis not present

## 2019-09-18 DIAGNOSIS — R05 Cough: Secondary | ICD-10-CM

## 2019-09-18 DIAGNOSIS — J439 Emphysema, unspecified: Secondary | ICD-10-CM

## 2019-09-18 DIAGNOSIS — R0609 Other forms of dyspnea: Secondary | ICD-10-CM

## 2019-09-18 DIAGNOSIS — R059 Cough, unspecified: Secondary | ICD-10-CM

## 2019-09-18 MED ORDER — ANORO ELLIPTA 62.5-25 MCG/INH IN AEPB: 1.0000 | INHALATION_SPRAY | Freq: Every day | RESPIRATORY_TRACT | 0 refills | Status: DC

## 2019-09-18 MED ORDER — ALBUTEROL SULFATE HFA 108 (90 BASE) MCG/ACT IN AERS: 2.0000 | INHALATION_SPRAY | Freq: Four times a day (QID) | RESPIRATORY_TRACT | 3 refills | Status: DC | PRN

## 2019-09-18 NOTE — Patient Instructions (Signed)
1.  We will obtain a CT scan of the chest.  2.  We will give you a trial of Anoro Ellipta 1 inhalation daily.  Do not use Spiriva while using Anoro.  Let us know if Anoro works well for you.  3.  We will send in for emergency inhaler.  4.  We will see you back in 2 months time.  Call sooner should any new difficulties arise.

## 2019-09-18 NOTE — Progress Notes (Signed)
Subjective:    Patient ID: Brianna Adams, female    DOB: May 30, 1941, 79 y.o.   MRN: [id number]  Requesting MD/Service: Tasia Catchings Date of initial consultation: 04/26/18 by Dr. Merton Border Reason for consultation: History of lung cancer involving RML/RLL, status post resection, LUL lung nodule (PET positive, presumed malignant, XRT recently initiated).  Exertional dyspnea  PT PROFILE: 79 y.o. 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  Underwent en bloc resection in [place].  New finding of LUL nodule, PET positive.  Radiation therapy initiated.  Referred for evaluation of exertional dyspnea.  DATA: Chest CT 03 March 2018: Performed at [facility] in [place], need to obtain PET 04/11/18: Mild emphysematous changes.  Postoperative changes noted.  Hypermetabolic LUL nodule PFTs 24/58/0998: Lung volumes not valid.  Against obstruction noted.  Decreased diffusion capacity.  Per report no significant change from prior PFTs in [place].  INTERVAL: Since last visit with Dr. Alva Garnet on 26 April 2018 no new issues.  Was following in [place] with her thoracic oncologist there.  Follows here with Dr.Yu and Dr. Baruch Gouty.  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  Subsequently had new finding of left upper lobe nodule, PET positive and SBRT initiated.  Was initially seen by Dr. Merton Border on 26 April 2018 due to exertional dyspnea.  She continues to report exertional dyspnea with little day-to-day variation.  She has not had any chest pain, fever, chills or sweats.  No purulent sputum production.  No hemoptysis.  Occasional cough postprandially, PPI helps.  No orthopnea or paroxysmal nocturnal dyspnea.  During her last visit with Dr. Alva Garnet she was  started on [facility].  She notes that this was helpful.  After her visit with Dr. Alva Garnet she was intending to go back to [place] however she has now decided to continue to reside here to get support from her daughter.  She was switched to Spiriva in the interim and notes that this medication is not as helpful as the Anoro was.   Review of Systems A 10 point review of systems was performed and it is as noted above otherwise negative.  Allergies  Allergen Reactions  . Penicillin G Shortness Of Breath    Other reaction(s): Rash  . Ace Inhibitors Swelling  . Atorvastatin     Other reaction(s): Myalgias (Muscle Pain) Other reaction(s): Myalgias (muscle pain)  . Cortisone     Other reaction(s): Patient not sure  . Hydrocortisone Other (See Comments)  . Lisinopril     Other reaction(s): Angioedema  . Meloxicam Other (See Comments)    Bloody stools  . Penicillins Hives  . Pravastatin     Other reaction(s): Chest Pain   Current medications reviewed.  Immunizations reviewed.  Interim history reviewed.     Objective:   Physical Exam BP 128/72 (BP Location: Left Arm, Cuff Size: Large)   Pulse 91   Temp (!) 97 F (36.1 C) (Temporal)   Ht 5' 5" (1.651 m)   Wt 155 lb 12.8 oz (70.7 kg)   SpO2 96%   BMI 25.93 kg/m  GENERAL: Well-developed well-nourished elderly woman no acute distress, she is fully ambulatory. HEAD: Normocephalic, atraumatic.  EYES: Pupils equal, round, reactive to light.  No scleral icterus.  MOUTH: Nose/mouth/throat not examined due to masking requirements for  COVID 19. NECK: Supple. No thyromegaly. Trachea midline. No JVD.  No adenopathy. PULMONARY: Lungs clear to auscultation bilaterally. CARDIOVASCULAR: S1 and S2. Regular rate and rhythm.  There is a grade 2/6 systolic ejection murmur left sternal border. GASTROINTESTINAL: Benign. MUSCULOSKELETAL: No joint deformity, no clubbing, no edema.  NEUROLOGIC: No focal deficit.  Speech is fluent.  No gait  disturbance noted. SKIN: Intact,warm,dry.  Limited exam no rashes PSYCH: Mood and behavior appropriate.     Assessment & Plan:     ICD-10-CM   1. Squamous cell carcinoma lung, unspecified laterality (HCC)  C34.90 CT CHEST W/OUT CONTRAST   Obtain old films from [facility] in [place] CT chest for follow-up and staging  2. Mild emphysema by CT chest  J43.9    Switch Spiriva to Anoro Ellipta Will need repeat PFTs in the future  3. DOE (dyspnea on exertion)  R06.00    Stable, no significant change  4. Postprandial cough  R05    Continue PPI and antireflux measures Improved overall   Meds ordered this encounter  Medications  .  umeclidinium-vilanterol (ANORO ELLIPTA) 62.5-25 MCG/INH AEPB    Sig: Inhale 1 puff into the lungs daily.    Dispense:  1 each    Refill:  0    Order Specific Question:   Lot Number?    Answer:   [id number]    Order Specific Question:   Expiration Date?    Answer:   03/05/2021    Order Specific Question:   Manufacturer?    Answer:   [employer] [[id number]]  . albuterol (VENTOLIN HFA) 108 (90 Base) MCG/ACT inhaler    Sig: Inhale 2 puffs into the lungs every 6 (six) hours as needed for wheezing or shortness of breath.    Dispense:  18 g    Refill:  3   Orders Placed This Encounter  Procedures  . CT CHEST W/OUT CONTRAST    Standing Status:   Future    Number of Occurrences:   1    Standing Expiration Date:   11/15/2020    Scheduling Instructions:     Please schedule next available.    Order Specific Question:   Preferred imaging location?    Answer:   [facility]    Order Specific Question:   Radiology Contrast Protocol - do NOT remove file path    Answer:   [email or web address]   Discussion:  Patient's dyspnea is at baseline.  She has had postprandial cough that has been approved by the use of proton pump inhibitor.  She has been advised to continue the same.  We will give her a trial of Anoro Ellipta and discontinue  Spiriva.  We will reevaluate for potential recurrence with a CT scan of the chest.  We will obtain old films from [facility] in [facility] where the patient had prior surgery and treatment for her squamous cell carcinoma of the lung.  She will need PFTs in the future.  We will see her in follow-up in 2 months time she is to contact us prior to that time should any new difficulties arise.   Renold Don, MD [facility]   *This note was dictated using voice recognition software/Dragon.  Despite best efforts to proofread, errors can occur which can change the meaning.  Any change was purely unintentional.

## 2019-09-19 ENCOUNTER — Telehealth: Payer: Self-pay | Admitting: Pulmonary Disease

## 2019-09-19 NOTE — Telephone Encounter (Signed)
Per Dr. [name] verbally- recommend purchasing floor bicycle pedals and have pt do this once daily. Pt can increase as she feels up to it.  Also recommend protein like ensure.  Pt's daughter, Eileen Stanford is aware of recommendations and voiced her understanding.  Nothing further is needed.

## 2019-09-24 ENCOUNTER — Other Ambulatory Visit: Payer: Self-pay

## 2019-09-24 MED ORDER — ANORO ELLIPTA 62.5-25 MCG/INH IN AEPB: 1.0000 | INHALATION_SPRAY | Freq: Every day | RESPIRATORY_TRACT | 2 refills | Status: DC

## 2019-09-26 ENCOUNTER — Other Ambulatory Visit: Payer: Self-pay | Admitting: Pulmonary Disease

## 2019-09-26 ENCOUNTER — Ambulatory Visit: Payer: Medicare Other

## 2019-09-26 MED ORDER — ANORO ELLIPTA 62.5-25 MCG/INH IN AEPB: 1.0000 | INHALATION_SPRAY | Freq: Every day | RESPIRATORY_TRACT | 2 refills | Status: DC

## 2019-09-26 NOTE — Telephone Encounter (Signed)
Rx request from [facility]. rx has been sent to preferred pharmacy.  Nothing further is needed.

## 2019-09-27 ENCOUNTER — Ambulatory Visit
Admission: RE | Admit: 2019-09-27 | Discharge: 2019-09-27 | Disposition: A | Discharge status: Home / Self Care | Payer: Medicare Other | Source: Ambulatory Visit | Attending: Pulmonary Disease | Admitting: Pulmonary Disease

## 2019-09-27 ENCOUNTER — Other Ambulatory Visit: Payer: Self-pay

## 2019-09-27 ENCOUNTER — Ambulatory Visit: Payer: Medicare Other | Admitting: Nurse Practitioner

## 2019-09-27 DIAGNOSIS — C3490 Malignant neoplasm of unspecified part of unspecified bronchus or lung: Secondary | ICD-10-CM | POA: Insufficient documentation

## 2019-10-04 ENCOUNTER — Ambulatory Visit (INDEPENDENT_AMBULATORY_CARE_PROVIDER_SITE_OTHER): Payer: Medicare Other

## 2019-10-04 ENCOUNTER — Encounter: Payer: Self-pay | Admitting: Nurse Practitioner

## 2019-10-04 ENCOUNTER — Ambulatory Visit (INDEPENDENT_AMBULATORY_CARE_PROVIDER_SITE_OTHER): Payer: Medicare Other | Admitting: Nurse Practitioner

## 2019-10-04 ENCOUNTER — Other Ambulatory Visit: Payer: Self-pay

## 2019-10-04 VITALS — BP 132/78 | HR 98 | Temp 97.5°F | Ht 61.6 in | Wt 156.2 lb

## 2019-10-04 VITALS — BP 132/78 | HR 98 | Temp 97.5°F | Ht 61.6 in | Wt 156.0 lb

## 2019-10-04 DIAGNOSIS — E119 Type 2 diabetes mellitus without complications: Secondary | ICD-10-CM

## 2019-10-04 DIAGNOSIS — Z0000 Encounter for general adult medical examination without abnormal findings: Secondary | ICD-10-CM

## 2019-10-04 DIAGNOSIS — R1319 Other dysphagia: Secondary | ICD-10-CM

## 2019-10-04 DIAGNOSIS — R059 Cough, unspecified: Secondary | ICD-10-CM

## 2019-10-04 DIAGNOSIS — R1310 Dysphagia, unspecified: Secondary | ICD-10-CM

## 2019-10-04 DIAGNOSIS — J449 Chronic obstructive pulmonary disease, unspecified: Secondary | ICD-10-CM

## 2019-10-04 DIAGNOSIS — R05 Cough: Secondary | ICD-10-CM | POA: Diagnosis not present

## 2019-10-04 DIAGNOSIS — I272 Pulmonary hypertension, unspecified: Secondary | ICD-10-CM | POA: Insufficient documentation

## 2019-10-04 DIAGNOSIS — M67432 Ganglion, left wrist: Secondary | ICD-10-CM | POA: Diagnosis not present

## 2019-10-04 LAB — POCT URINALYSIS DIPSTICK
Bilirubin, UA: NEGATIVE
Blood, UA: NEGATIVE
Glucose, UA: NEGATIVE
Ketones, UA: NEGATIVE
Leukocytes, UA: NEGATIVE
Nitrite, UA: NEGATIVE
Protein, UA: NEGATIVE
Spec Grav, UA: 1.02 (ref 1.010–1.025)
Urobilinogen, UA: 0.2 E.U./dL
pH, UA: 6 (ref 5.0–8.0)

## 2019-10-04 LAB — POCT UA - MICROALBUMIN
Albumin/Creatinine Ratio, Urine, POC: 30
Creatinine, POC: 200 mg/dL
Microalbumin Ur, POC: 30 mg/L

## 2019-10-04 NOTE — Progress Notes (Signed)
This visit occurred during the SARS-CoV-2 public health emergency.  Safety protocols were in place, including screening questions prior to the visit, additional usage of staff PPE, and extensive cleaning of exam room while observing appropriate contact time as indicated for disinfecting solutions.  Subjective:     Patient ID: Brianna Adams , female    DOB: [birth year] , 79 y.o.   MRN: [id number]   Chief Complaint  Patient presents with  . Annual Exam    HPI  Here for HM and her AWV with [facility]  She has been to Dr. [name] - no new cancer areas with the CT scan  She will have a cough when she eats.   Cough This is a new problem. The current episode started more than 1 month ago (3 months). The problem occurs constantly. Pertinent negatives include no chest pain, ear congestion, headaches, nasal congestion or sore throat.    The patient states she uses status post hysterectomy for birth control.  Mammogram last done 10/25/2018.  Negative for: breast discharge, breast lump(s), breast pain and breast self exam.  Pertinent negatives include abnormal bleeding (hematology), anxiety, decreased libido, depression, difficulty falling sleep, dyspareunia, history of infertility, nocturia, sexual dysfunction, sleep disturbances, urinary incontinence, urinary urgency, vaginal discharge and vaginal itching. Diet regular, avoids increased sweets. The patient states her exercise level is using a floor bike and uses her arms. She has gotten up to 700 reps.      The patient's tobacco use is:  Social History   Tobacco Use  Smoking Status Former Smoker  . Packs/day: 1.00  . Years: 58.00  . Pack years: 58.00  . Types: Cigarettes  . Quit date: 10/22/2015  . Years since quitting: 3.9  Smokeless Tobacco Never Used   She has been exposed to passive smoke. The patient's alcohol use is:  Social History   Substance and Sexual Activity  Alcohol Use Never   Additional information: she will be going to Dr.  Charlesetta Garibaldi for mammogram.    Past Medical History:  Diagnosis Date  . COPD (chronic obstructive pulmonary disease) ([place])   . Diabetes ([place])   . Diverticulitis   . High cholesterol   . Lung cancer ([place])   . Pulmonary hypertension ([place])   . Uterine cancer ([place])      Family History  Problem Relation Age of Onset  . Hypertension Mother   . Heart attack Father   . Cancer Maternal Aunt      Current Outpatient Medications:  .  albuterol (VENTOLIN HFA) 108 (90 Base) MCG/ACT inhaler, Inhale 2 puffs into the lungs every 6 (six) hours as needed for wheezing or shortness of breath., Disp: 18 g, Rfl: 3 .  CALCIUM PO, Take 1,200 mg by mouth daily at 12 noon. , Disp: , Rfl:  .  CINNAMON PO, Take 2,000 mg by mouth. , Disp: , Rfl:  .  glimepiride (AMARYL) 2 MG tablet, TAKE 1 TABLET BY MOUTH DAILY WITH BREAKFAST, Disp: 180 tablet, Rfl: 1 .  Multiple Vitamins-Minerals (CENTRUM SILVER PO), Take by mouth., Disp: , Rfl:  .  omeprazole (PRILOSEC) 40 MG capsule, Take 1 capsule (40 mg total) by mouth daily., Disp: 30 capsule, Rfl: 0 .  pravastatin (PRAVACHOL) 10 MG tablet, Take 1 tablet (10 mg total) by mouth daily., Disp: 30 tablet, Rfl: 0 .  SitaGLIPtin-MetFORMIN HCl (JANUMET XR) [id number] MG TB24, Take 1 tablet by mouth 2 (two) times daily., Disp: 180 tablet, Rfl: 1 .  umeclidinium-vilanterol (ANORO ELLIPTA) 62.5-25 MCG/INH AEPB,  Inhale 1 puff into the lungs daily., Disp: 1 each, Rfl: 2 .  vitamin C (ASCORBIC ACID) 500 MG tablet, Take 500 mg by mouth daily. 4 times weekly, Disp: , Rfl:  .  acetaminophen (TYLENOL) 325 MG tablet, Take 325 mg by mouth every 6 (six) hours as needed., Disp: , Rfl:  .  Coenzyme Q10 (COQ10) 100 MG CAPS, Take 1 capsule by mouth daily at 12 noon., Disp: , Rfl:  .  Respiratory Therapy Supplies (BREATHERITE VALVED MDI CHAMBER) DEVI, 1 each by Does not apply route as needed. (Patient not taking: Reported on 10/04/2019), Disp: 1 Device, Rfl: 2 .  tiotropium (SPIRIVA HANDIHALER) 18 MCG  inhalation capsule, Spiriva with HandiHaler 18 mcg and inhalation capsules, Disp: , Rfl:    Allergies  Allergen Reactions  . Penicillin G Shortness Of Breath    Other reaction(s): Rash  . Ace Inhibitors Swelling  . Atorvastatin     Other reaction(s): Myalgias (Muscle Pain) Other reaction(s): Myalgias (muscle pain)  . Cortisone     Other reaction(s): Angioedema  . Hydrocortisone Other (See Comments)  . Lisinopril     Other reaction(s): Angioedema  . Penicillins Hives  . Pravastatin     Other reaction(s): Chest Pain     Review of Systems  Constitutional: Negative.   HENT: Negative.  Negative for sore throat.   Eyes: Negative.   Respiratory: Positive for cough (after eating).   Cardiovascular: Negative for chest pain, palpitations and leg swelling.  Gastrointestinal: Negative.   Endocrine: Negative for polydipsia, polyphagia and polyuria.  Genitourinary: Negative.   Musculoskeletal: Negative.   Skin:       Firm nodule to left wrist slightly larger than previous visit  Neurological: Negative for dizziness and headaches.  Hematological: Negative.   Psychiatric/Behavioral: Negative.      Today's Vitals   10/04/19 1428  BP: 132/78  Pulse: 98  Temp: (!) 97.5 F (36.4 C)  TempSrc: Oral  Weight: 156 lb 3.2 oz (70.9 kg)  Height: 5' 1.6" (1.565 m)  PainSc: 0-No pain   Body mass index is 28.94 kg/m.   Objective:  Physical Exam Constitutional:      General: She is not in acute distress.    Appearance: Normal appearance. She is well-developed. She is obese.  HENT:     Head: Normocephalic and atraumatic.     Right Ear: Hearing normal.     Left Ear: Hearing normal.     Nose: Nose normal.  Eyes:     General: Lids are normal.     Extraocular Movements: Extraocular movements intact.     Conjunctiva/sclera: Conjunctivae normal.     Pupils: Pupils are equal, round, and reactive to light.     Funduscopic exam:    Right eye: No papilledema.        Left eye: No papilledema.   Neck:     Thyroid: No thyroid mass.     Vascular: No carotid bruit.  Cardiovascular:     Rate and Rhythm: Normal rate and regular rhythm.     Pulses: Normal pulses.     Heart sounds: Normal heart sounds. No murmur.  Pulmonary:     Effort: No respiratory distress.     Breath sounds: Normal breath sounds.     Comments: Slight work to breath during visit with mask on Abdominal:     General: Abdomen is flat. Bowel sounds are normal. There is no distension.     Palpations: Abdomen is soft.     Tenderness: There  is no abdominal tenderness.  Musculoskeletal:        General: No swelling. Normal range of motion.     Cervical back: Full passive range of motion without pain, normal range of motion and neck supple.     Right lower leg: No edema.     Left lower leg: No edema.  Skin:    General: Skin is warm and dry.     Capillary Refill: Capillary refill takes less than 2 seconds.     Comments: Firm nodule to left wrist  Neurological:     General: No focal deficit present.     Mental Status: She is alert and oriented to person, place, and time.     Cranial Nerves: No cranial nerve deficit.     Sensory: No sensory deficit.  Psychiatric:        Mood and Affect: Mood normal.        Behavior: Behavior normal.        Thought Content: Thought content normal.        Judgment: Judgment normal.         Assessment And Plan:     1. Encounter for general adult medical examination w/o abnormal findings  Pt's annual wellness exam was performed and geriatric assessment reviewed.   Pt has no new identiafble wellness concerns at this time.   WIll obtain routine labs.   Will obtain UA and micro.   Behavior modifications discussed and diet history reviewed. Pt will continue to exercise regularly and modify diet, with low GI, plant based foods and decrease food intake of processed foods.   Recommend intake of daily multivitamin, Vitamin D, and calcium.  Had her AWV today as well - POCT  Urinalysis Dipstick (81002) - POCT UA - Microalbumin  2. Cough  Occurs with eating concerning for dysphagia which could lead to aspiration  Encouraged to eat soft foods and chew well  3. Esophageal dysphagia  Coughing after eating  Will check barium swallow to see if will need further treatment or changes in diet  - SLP modified barium swallow; Future  4. Ganglion cyst of wrist, left  Firm nodule to left wrist, nontender  Will refer again to dermatology the surgeon declined  - Ambulatory referral to Dermatology  5. Pulmonary hypertension, unspecified ([place])  Chronic, good control  Continue with current medications - CMP14+EGFR  6. Type 2 diabetes mellitus without complication, without long-term current use of insulin (HCC)  Chronic, controlled  Continue with current medications  Encouraged to limit intake of sugary foods and drinks  Encouraged to increase physical activity to 150 minutes per week as tolerated - Hemoglobin A1c - Lipid panel  7. Chronic obstructive pulmonary disease, unspecified COPD type ([place])  Chronic,   She did have episodes of increased work to breath during visit while wearing her mask  Provided oxygen via [place] at 2 l/m    [name], [name]    THE PATIENT IS ENCOURAGED TO PRACTICE SOCIAL DISTANCING DUE TO THE COVID-19 PANDEMIC.

## 2019-10-04 NOTE — Patient Instructions (Signed)
Brianna Adams , Thank you for taking time to come for your Medicare Wellness Visit. I appreciate your ongoing commitment to your health goals. Please review the following plan we discussed and let me know if I can assist you in the future.   Screening recommendations/referrals: Colonoscopy: up to date per patient Mammogram: 10/2018 Bone Density: 08/2017 Recommended yearly ophthalmology/optometry visit for glaucoma screening and checkup Recommended yearly dental visit for hygiene and checkup  Vaccinations: Influenza vaccine: 05/2019 Pneumococcal vaccine: 10/2013 Tdap vaccine: 08/2017 Shingles vaccine: 08/2019    Advanced directives: Please bring a copy of your POA (Power of [place]) and/or Living Will to your next appointment.    Conditions/risks identified: overweight  Next appointment: 01/07/2020 at 2:00   Preventive Care 29 Years and Older, Female Preventive care refers to lifestyle choices and visits with your health care provider that can promote health and wellness. What does preventive care include?  A yearly physical exam. This is also called an annual well check.  Dental exams once or twice a year.  Routine eye exams. Ask your health care provider how often you should have your eyes checked.  Personal lifestyle choices, including:  Daily care of your teeth and gums.  Regular physical activity.  Eating a healthy diet.  Avoiding tobacco and drug use.  Limiting alcohol use.  Practicing safe sex.  Taking low-dose aspirin every day.  Taking vitamin and mineral supplements as recommended by your health care provider. What happens during an annual well check? The services and screenings done by your health care provider during your annual well check will depend on your age, overall health, lifestyle risk factors, and family history of disease. Counseling  Your health care provider may ask you questions about your:  Alcohol use.  Tobacco use.  Drug  use.  Emotional well-being.  Home and relationship well-being.  Sexual activity.  Eating habits.  History of falls.  Memory and ability to understand (cognition).  Work and work Statistician.  Reproductive health. Screening  You may have the following tests or measurements:  Height, weight, and BMI.  Blood pressure.  Lipid and cholesterol levels. These may be checked every 5 years, or more frequently if you are over 27 years old.  Skin check.  Lung cancer screening. You may have this screening every year starting at age 98 if you have a 30-pack-year history of smoking and currently smoke or have quit within the past 15 years.  Fecal occult blood test (FOBT) of the stool. You may have this test every year starting at age 42.  Flexible sigmoidoscopy or colonoscopy. You may have a sigmoidoscopy every 5 years or a colonoscopy every 10 years starting at age 56.  Hepatitis C blood test.  Hepatitis B blood test.  Sexually transmitted disease (STD) testing.  Diabetes screening. This is done by checking your blood sugar (glucose) after you have not eaten for a while (fasting). You may have this done every 1-3 years.  Bone density scan. This is done to screen for osteoporosis. You may have this done starting at age 61.  Mammogram. This may be done every 1-2 years. Talk to your health care provider about how often you should have regular mammograms. Talk with your health care provider about your test results, treatment options, and if necessary, the need for more tests. Vaccines  Your health care provider may recommend certain vaccines, such as:  Influenza vaccine. This is recommended every year.  Tetanus, diphtheria, and acellular pertussis (Tdap, Td) vaccine. You may  need a Td booster every 10 years.  Zoster vaccine. You may need this after age 86.  Pneumococcal 13-valent conjugate (PCV13) vaccine. One dose is recommended after age 5.  Pneumococcal polysaccharide  (PPSV23) vaccine. One dose is recommended after age 32. Talk to your health care provider about which screenings and vaccines you need and how often you need them. This information is not intended to replace advice given to you by your health care provider. Make sure you discuss any questions you have with your health care provider. Document Released: 09/19/2015 Document Revised: 05/12/2016 Document Reviewed: 06/24/2015 Elsevier Interactive Patient Education  2017 [place] Prevention in the Home Falls can cause injuries. They can happen to people of all ages. There are many things you can do to make your home safe and to help prevent falls. What can I do on the outside of my home?  Regularly fix the edges of walkways and driveways and fix any cracks.  Remove anything that might make you trip as you walk through a door, such as a raised step or threshold.  Trim any bushes or trees on the path to your home.  Use bright outdoor lighting.  Clear any walking paths of anything that might make someone trip, such as rocks or tools.  Regularly check to see if handrails are loose or broken. Make sure that both sides of any steps have handrails.  Any raised decks and porches should have guardrails on the edges.  Have any leaves, snow, or ice cleared regularly.  Use sand or salt on walking paths during winter.  Clean up any spills in your garage right away. This includes oil or grease spills. What can I do in the bathroom?  Use night lights.  Install grab bars by the toilet and in the tub and shower. Do not use towel bars as grab bars.  Use non-skid mats or decals in the tub or shower.  If you need to sit down in the shower, use a plastic, non-slip stool.  Keep the floor dry. Clean up any water that spills on the floor as soon as it happens.  Remove soap buildup in the tub or shower regularly.  Attach bath mats securely with double-sided non-slip rug tape.  Do not have  throw rugs and other things on the floor that can make you trip. What can I do in the bedroom?  Use night lights.  Make sure that you have a light by your bed that is easy to reach.  Do not use any sheets or blankets that are too big for your bed. They should not hang down onto the floor.  Have a firm chair that has side arms. You can use this for support while you get dressed.  Do not have throw rugs and other things on the floor that can make you trip. What can I do in the kitchen?  Clean up any spills right away.  Avoid walking on wet floors.  Keep items that you use a lot in easy-to-reach places.  If you need to reach something above you, use a strong step stool that has a grab bar.  Keep electrical cords out of the way.  Do not use floor polish or wax that makes floors slippery. If you must use wax, use non-skid floor wax.  Do not have throw rugs and other things on the floor that can make you trip. What can I do with my stairs?  Do not leave any items on  the stairs.  Make sure that there are handrails on both sides of the stairs and use them. Fix handrails that are broken or loose. Make sure that handrails are as long as the stairways.  Check any carpeting to make sure that it is firmly attached to the stairs. Fix any carpet that is loose or worn.  Avoid having throw rugs at the top or bottom of the stairs. If you do have throw rugs, attach them to the floor with carpet tape.  Make sure that you have a light switch at the top of the stairs and the bottom of the stairs. If you do not have them, ask someone to add them for you. What else can I do to help prevent falls?  Wear shoes that:  Do not have high heels.  Have rubber bottoms.  Are comfortable and fit you well.  Are closed at the toe. Do not wear sandals.  If you use a stepladder:  Make sure that it is fully opened. Do not climb a closed stepladder.  Make sure that both sides of the stepladder are  locked into place.  Ask someone to hold it for you, if possible.  Clearly mark and make sure that you can see:  Any grab bars or handrails.  First and last steps.  Where the edge of each step is.  Use tools that help you move around (mobility aids) if they are needed. These include:  Canes.  Walkers.  Scooters.  Crutches.  Turn on the lights when you go into a dark area. Replace any light bulbs as soon as they burn out.  Set up your furniture so you have a clear path. Avoid moving your furniture around.  If any of your floors are uneven, fix them.  If there are any pets around you, be aware of where they are.  Review your medicines with your doctor. Some medicines can make you feel dizzy. This can increase your chance of falling. Ask your doctor what other things that you can do to help prevent falls. This information is not intended to replace advice given to you by your health care provider. Make sure you discuss any questions you have with your health care provider. Document Released: 06/19/2009 Document Revised: 01/29/2016 Document Reviewed: 09/27/2014 Elsevier Interactive Patient Education  2017 [employer].

## 2019-10-04 NOTE — Patient Instructions (Signed)
Health Maintenance  Topic Date Due  . OPHTHALMOLOGY EXAM  06/24/2019  . HEMOGLOBIN A1C  12/13/2019  . URINE MICROALBUMIN  03/13/2020  . FOOT EXAM  06/13/2020  . TETANUS/TDAP  08/18/2027  . INFLUENZA VACCINE  Completed  . DEXA SCAN  Completed  . PNA vac Low Risk Adult  Completed   Health Maintenance After Age 80 After age 81, you are at a higher risk for certain long-term diseases and infections as well as injuries from falls. Falls are a major cause of broken bones and head injuries in people who are older than age 39. Getting regular preventive care can help to keep you healthy and well. Preventive care includes getting regular testing and making lifestyle changes as recommended by your health care provider. Talk with your health care provider about:  Which screenings and tests you should have. A screening is a test that checks for a disease when you have no symptoms.  A diet and exercise plan that is right for you. What should I know about screenings and tests to prevent falls? Screening and testing are the best ways to find a health problem early. Early diagnosis and treatment give you the best chance of managing medical conditions that are common after age 51. Certain conditions and lifestyle choices may make you more likely to have a fall. Your health care provider may recommend:  Regular vision checks. Poor vision and conditions such as cataracts can make you more likely to have a fall. If you wear glasses, make sure to get your prescription updated if your vision changes.  Medicine review. Work with your health care provider to regularly review all of the medicines you are taking, including over-the-counter medicines. Ask your health care provider about any side effects that may make you more likely to have a fall. Tell your health care provider if any medicines that you take make you feel dizzy or sleepy.  Osteoporosis screening. Osteoporosis is a condition that causes the bones to  get weaker. This can make the bones weak and cause them to break more easily.  Blood pressure screening. Blood pressure changes and medicines to control blood pressure can make you feel dizzy.  Strength and balance checks. Your health care provider may recommend certain tests to check your strength and balance while standing, walking, or changing positions.  Foot health exam. Foot pain and numbness, as well as not wearing proper footwear, can make you more likely to have a fall.  Depression screening. You may be more likely to have a fall if you have a fear of falling, feel emotionally low, or feel unable to do activities that you used to do.  Alcohol use screening. Using too much alcohol can affect your balance and may make you more likely to have a fall. What actions can I take to lower my risk of falls? General instructions  Talk with your health care provider about your risks for falling. Tell your health care provider if: ? You fall. Be sure to tell your health care provider about all falls, even ones that seem minor. ? You feel dizzy, sleepy, or off-balance.  Take over-the-counter and prescription medicines only as told by your health care provider. These include any supplements.  Eat a healthy diet and maintain a healthy weight. A healthy diet includes low-fat dairy products, low-fat (lean) meats, and fiber from whole grains, beans, and lots of fruits and vegetables. Home safety  Remove any tripping hazards, such as rugs, cords, and clutter.  Install safety equipment such as grab bars in bathrooms and safety rails on stairs.  Keep rooms and walkways well-lit. Activity   Follow a regular exercise program to stay fit. This will help you maintain your balance. Ask your health care provider what types of exercise are appropriate for you.  If you need a cane or walker, use it as recommended by your health care provider.  Wear supportive shoes that have nonskid soles. Lifestyle   Do not drink alcohol if your health care provider tells you not to drink.  If you drink alcohol, limit how much you have: ? 0-1 drink a day for women. ? 0-2 drinks a day for men.  Be aware of how much alcohol is in your drink. In the [place] one drink equals one typical bottle of beer (12 oz), one-half glass of wine (5 oz), or one shot of hard liquor (1 oz).  Do not use any products that contain nicotine or tobacco, such as cigarettes and e-cigarettes. If you need help quitting, ask your health care provider. Summary  Having a healthy lifestyle and getting preventive care can help to protect your health and wellness after age 25.  Screening and testing are the best way to find a health problem early and help you avoid having a fall. Early diagnosis and treatment give you the best chance for managing medical conditions that are more common for people who are older than age 54.  Falls are a major cause of broken bones and head injuries in people who are older than age 68. Take precautions to prevent a fall at home.  Work with your health care provider to learn what changes you can make to improve your health and wellness and to prevent falls. This information is not intended to replace advice given to you by your health care provider. Make sure you discuss any questions you have with your health care provider. Document Revised: 12/14/2018 Document Reviewed: 07/06/2017 Elsevier Patient Education  2020 [employer].

## 2019-10-04 NOTE — Progress Notes (Signed)
This visit occurred during the SARS-CoV-2 public health emergency.  Safety protocols were in place, including screening questions prior to the visit, additional usage of staff PPE, and extensive cleaning of exam room while observing appropriate contact time as indicated for disinfecting solutions.  Subjective:   Brianna Adams is a 79 y.o. female who presents for Medicare Annual (Subsequent) preventive examination.  Review of Systems:  n/a Cardiac Risk Factors include: advanced age (>40mn, >>76women);diabetes mellitus;sedentary lifestyle     Objective:     Vitals: BP 132/78 (BP Location: Left Arm, Patient Position: Sitting)   Pulse 98   Temp (!) 97.5 F (36.4 C) (Oral)   Ht 5' 1.6" (1.565 m)   Wt 156 lb (70.8 kg)   BMI 28.90 kg/m   Body mass index is 28.9 kg/m.  Advanced Directives 10/04/2019 03/29/2018 03/21/2018  Does Patient Have a Medical Advance Directive? Yes Yes Yes  Type of AParamedicof [place]Living will [place]South [place]Living will [place] will  Copy of [place]? No - copy requested - -  Would patient like information on creating a medical advance directive? - No - Patient declined -    Tobacco Social History   Tobacco Use  Smoking Status Former Smoker  . Packs/day: 1.00  . Years: 58.00  . Pack years: 58.00  . Types: Cigarettes  . Quit date: 10/22/2015  . Years since quitting: 3.9  Smokeless Tobacco Never Used     Counseling given: Not Answered   Clinical Intake:  Pre-visit preparation completed: Yes  Pain : No/denies pain     Nutritional Status: BMI 25 -29 Overweight Nutritional Risks: None Diabetes: Yes  How often do you need to have someone help you when you read instructions, pamphlets, or other written materials from your doctor or pharmacy?: 1 - Never What is the last grade level you completed in school?: master's degree  Interpreter Needed?:  No  Information entered by :: NAllen LPN  Past Medical History:  Diagnosis Date  . COPD (chronic obstructive pulmonary disease) ([place]   . Diabetes ([place]   . Diverticulitis   . High cholesterol   . Lung cancer ([place]   . Pulmonary hypertension ([place]   . [facility]ine [facility] ([facility]    Past Surgical History:  Procedure Laterality Date  . ABDOMINAL HYSTERECTOMY    . cataract surgery  2012 and 2017  . FIBEROPTIC BRONCHOSCOPY    . resection of lung cancer  2018  . right lower lobe non-anatomocal lung resection wedge    . right thorascoscopy     Family History  Problem Relation Age of Onset  . Hypertension Mother   . Heart attack Father   . Cancer Maternal Aunt    Social History   Socioeconomic History  . Marital status: Widowed    Spouse name: Not on file  . Number of children: Not on file  . Years of education: Not on file  . Highest education level: Not on file  Occupational History  . Occupation: retired  Tobacco Use  . Smoking status: Former Smoker    Packs/day: 1.00    Years: 58.00    Pack years: 58.00    Types: Cigarettes    Quit date: 10/22/2015    Years since quitting: 3.9  . Smokeless tobacco: Never Used  Substance and Sexual Activity  . Alcohol use: Never  . Drug use: Never  . Sexual activity: Not Currently  Other Topics Concern  .  Not on file  Social History Narrative  . Not on file   Social Determinants of Health   Financial Resource Strain: Low Risk   . Difficulty of Paying Living Expenses: Not hard at all  Food Insecurity: No Food Insecurity  . Worried About Charity fundraiser in the Last Year: Never true  . Ran Out of Food in the Last Year: Never true  Transportation Needs: No Transportation Needs  . Lack of Transportation (Medical): No  . Lack of Transportation (Non-Medical): No  Physical Activity: Insufficiently Active  . Days of Exercise per Week: 2 days  . Minutes of Exercise per Session: 10 min  Stress: No Stress Concern Present  . Feeling  of Stress : Not at all  Social Connections:   . Frequency of Communication with Friends and Family: Not on file  . Frequency of Social Gatherings with Friends and Family: Not on file  . Attends Religious Services: Not on file  . Active Member of Clubs or Organizations: Not on file  . Attends Archivist Meetings: Not on file  . Marital Status: Not on file    Outpatient Encounter Medications as of 10/04/2019  Medication Sig  . acetaminophen (TYLENOL) 325 MG tablet Take 325 mg by mouth every 6 (six) hours as needed.  Marland Kitchen albuterol (VENTOLIN HFA) 108 (90 Base) MCG/ACT inhaler Inhale 2 puffs into the lungs every 6 (six) hours as needed for wheezing or shortness of breath.  Marland Kitchen CALCIUM PO Take 1,200 mg by mouth daily at 12 noon.   Marland Kitchen CINNAMON PO Take 2,000 mg by mouth.   . Coenzyme Q10 (COQ10) 100 MG CAPS Take 1 capsule by mouth daily at 12 noon.  Marland Kitchen glimepiride (AMARYL) 2 MG tablet TAKE 1 TABLET BY MOUTH DAILY WITH BREAKFAST  . Multiple Vitamins-Minerals (CENTRUM SILVER PO) Take by mouth.  Marland Kitchen omeprazole (PRILOSEC) 40 MG capsule Take 1 capsule (40 mg total) by mouth daily.  . pravastatin (PRAVACHOL) 10 MG tablet Take 1 tablet (10 mg total) by mouth daily.  Marland Kitchen Respiratory Therapy Supplies (BREATHERITE VALVED MDI CHAMBER) DEVI 1 each by Does not apply route as needed. (Patient not taking: Reported on 10/04/2019)  . SitaGLIPtin-MetFORMIN HCl (JANUMET XR) [phone] MG TB24 Take 1 tablet by mouth 2 (two) times daily.  Marland Kitchen tiotropium (SPIRIVA HANDIHALER) 18 MCG inhalation capsule Spiriva with HandiHaler 18 mcg and inhalation capsules  . umeclidinium-vilanterol (ANORO ELLIPTA) 62.5-25 MCG/INH AEPB Inhale 1 puff into the lungs daily.  . vitamin C (ASCORBIC ACID) 500 MG tablet Take 500 mg by mouth daily. 4 times weekly   No facility-administered encounter medications on file as of 10/04/2019.    Activities of Daily Living In your present state of health, do you have any difficulty performing the  following activities: 10/04/2019 10/04/2019  Hearing? N N  Vision? N N  Difficulty concentrating or making decisions? N Y  Walking or climbing stairs? Y Y  Comment due to breathing -  Dressing or bathing? N N  Doing errands, shopping? Y N  Comment mostly has someone accompany her -  Conservation officer, nature and eating ? Y -  Using the Toilet? N -  In the past six months, have you accidently leaked urine? N -  Do you have problems with loss of bowel control? N -  Managing your Medications? N -  Managing your Finances? N -  Housekeeping or managing your Housekeeping? N -  Some recent data might be hidden    Patient Care Team: Laurance Flatten,  Doreene Burke, [name] as PCP - General (General Practice)    Assessment:   This is a routine wellness examination for Brianna Adams.  Exercise Activities and Dietary recommendations Current Exercise Habits: Home exercise routine, Type of exercise: Other - see comments(pedals for feet and arms), Time (Minutes): 10, Frequency (Times/Week): 2, Weekly Exercise (Minutes/Week): 20  Goals    . Patient Stated     10/04/2019, wants to have raised area on left wrist taken care of and increase breathing capacity       Fall Risk Fall Risk  10/04/2019 10/04/2019 06/14/2019  Falls in the past year? 0 0 0  Risk for fall due to : Medication side effect - -  Follow up Falls evaluation completed;Education provided;Falls prevention discussed - -   Is the patient's home free of loose throw rugs in walkways, pet beds, electrical cords, etc?   yes      Grab bars in the bathroom? yes      Handrails on the stairs?   yes      Adequate lighting?   yes  Timed Get Up and Go performed: n/a  Depression Screen PHQ 2/9 Scores 10/04/2019 10/04/2019 06/14/2019 03/29/2018  PHQ - 2 Score 0 0 0 0  PHQ- 9 Score 0 - - -     Cognitive Function     6CIT Screen 10/04/2019  What Year? 0 points  What month? 0 points  What time? 3 points  Count back from 20 0 points  Months in reverse 0 points  Repeat phrase  2 points  Total Score 5    Immunization History  Administered Date(s) Administered  . Fluad Quad(high Dose 65+) 05/24/2019  . Influenza-Unspecified 06/06/2018  . Pneumococcal Conjugate-13 10/31/2013  . Pneumococcal Polysaccharide-23 11/05/2007  . Td 09/07/2003  . Tdap 08/17/2017  . Zoster 11/05/2010  . Zoster Recombinat (Shingrix) 08/17/2017, 06/28/2019, 09/05/2019    Qualifies for Shingles Vaccine? yes  Screening Tests Health Maintenance  Topic Date Due  . OPHTHALMOLOGY EXAM  06/24/2019  . HEMOGLOBIN A1C  12/13/2019  . URINE MICROALBUMIN  03/13/2020  . FOOT EXAM  06/13/2020  . TETANUS/TDAP  08/18/2027  . INFLUENZA VACCINE  Completed  . DEXA SCAN  Completed  . PNA vac Low Risk Adult  Completed    Cancer Screenings: Lung: Low Dose CT Chest recommended if Age 40-80 years, 30 pack-year currently smoking OR have quit w/in 15years. Patient does not qualify. Breast:  Up to date on Mammogram? Yes   Up to date of Bone Density/Dexa? Yes Colorectal: up to date per patient  Additional Screenings: : Hepatitis C Screening: n/a     Plan:    Patient wants to get raised area on left wrist taken care of and increase breathing capacity.   I have personally reviewed and noted the following in the patient's chart:   . Medical and social history . Use of alcohol, tobacco or illicit drugs  . Current medications and supplements . Functional ability and status . Nutritional status . Physical activity . Advanced directives . List of other physicians . Hospitalizations, surgeries, and ER visits in previous 12 months . Vitals . Screenings to include cognitive, depression, and falls . Referrals and appointments  In addition, I have reviewed and discussed with patient certain preventive protocols, quality metrics, and best practice recommendations. A written personalized care plan for preventive services as well as general preventive health recommendations were provided to patient.      Kellie Simmering, LPN  [phone]

## 2019-10-05 LAB — CMP14+EGFR
ALT: 11 IU/L (ref 0–32)
AST: 16 IU/L (ref 0–40)
Albumin/Globulin Ratio: 1.4 (ref 1.2–2.2)
Albumin: 4.4 g/dL (ref 3.7–4.7)
Alkaline Phosphatase: 77 IU/L (ref 39–117)
BUN/Creatinine Ratio: 17 (ref 12–28)
BUN: 15 mg/dL (ref 8–27)
Bilirubin Total: 0.2 mg/dL (ref 0.0–1.2)
CO2: 23 mmol/L (ref 20–29)
Calcium: 9.9 mg/dL (ref 8.7–10.3)
Chloride: 104 mmol/L (ref 96–106)
Creatinine, Ser: 0.87 mg/dL (ref 0.57–1.00)
GFR calc Af Amer: 73 mL/min/{1.73_m2} (ref >59)
GFR calc non Af Amer: 64 mL/min/{1.73_m2} (ref >59)
Globulin, Total: 3.2 g/dL (ref 1.5–4.5)
Glucose: 116 mg/dL — ABNORMAL HIGH (ref 65–99)
Potassium: 4.9 mmol/L (ref 3.5–5.2)
Sodium: 141 mmol/L (ref 134–144)
Total Protein: 7.6 g/dL (ref 6.0–8.5)

## 2019-10-05 LAB — LIPID PANEL
Chol/HDL Ratio: 2.7 ratio (ref 0.0–4.4)
Cholesterol, Total: 152 mg/dL (ref 100–199)
HDL: 57 mg/dL (ref >39)
LDL Chol Calc (NIH): 80 mg/dL (ref 0–99)
Triglycerides: 76 mg/dL (ref 0–149)
VLDL Cholesterol Cal: 15 mg/dL (ref 5–40)

## 2019-10-05 LAB — HEMOGLOBIN A1C
Est. average glucose Bld gHb Est-mCnc: 131 mg/dL
Hgb A1c MFr Bld: 6.2 % — ABNORMAL HIGH (ref 4.8–5.6)

## 2019-10-15 ENCOUNTER — Other Ambulatory Visit (HOSPITAL_COMMUNITY): Payer: Self-pay

## 2019-10-15 DIAGNOSIS — R059 Cough, unspecified: Secondary | ICD-10-CM

## 2019-10-15 DIAGNOSIS — R05 Cough: Secondary | ICD-10-CM

## 2019-10-15 DIAGNOSIS — R1310 Dysphagia, unspecified: Secondary | ICD-10-CM

## 2019-10-23 ENCOUNTER — Other Ambulatory Visit: Payer: Self-pay

## 2019-10-23 DIAGNOSIS — E119 Type 2 diabetes mellitus without complications: Secondary | ICD-10-CM

## 2019-10-23 MED ORDER — JANUMET XR 100-1000 MG PO TB24: 1.0000 | ORAL_TABLET | Freq: Two times a day (BID) | ORAL | 1 refills | Status: DC

## 2019-10-24 ENCOUNTER — Ambulatory Visit (HOSPITAL_COMMUNITY): Payer: Medicare Other

## 2019-10-24 ENCOUNTER — Other Ambulatory Visit (HOSPITAL_COMMUNITY): Payer: Self-pay | Admitting: Nurse Practitioner

## 2019-10-24 ENCOUNTER — Other Ambulatory Visit: Payer: Self-pay

## 2019-10-24 ENCOUNTER — Telehealth: Payer: Self-pay | Admitting: Nurse Practitioner

## 2019-10-24 ENCOUNTER — Other Ambulatory Visit: Payer: Self-pay | Admitting: Nurse Practitioner

## 2019-10-24 ENCOUNTER — Ambulatory Visit (HOSPITAL_COMMUNITY)
Admission: RE | Admit: 2019-10-24 | Discharge: 2019-10-24 | Disposition: A | Discharge status: Home / Self Care | Payer: Medicare Other | Source: Ambulatory Visit | Attending: Nurse Practitioner | Admitting: Nurse Practitioner

## 2019-10-24 DIAGNOSIS — R1310 Dysphagia, unspecified: Secondary | ICD-10-CM

## 2019-10-24 DIAGNOSIS — R1319 Other dysphagia: Secondary | ICD-10-CM

## 2019-10-24 NOTE — Telephone Encounter (Signed)
Received message from Speech therapy and recommend to order a barium swallow, will need to hold off on her visit today and come for barium swallow. Order placed on paper with referrals

## 2019-10-29 ENCOUNTER — Telehealth: Payer: Self-pay

## 2019-10-29 NOTE — Telephone Encounter (Signed)
Patient called stating her dose of janument has changed she was on janument 50-1000 and when she went to the pharmacy her Rx was chnaged to 100-1069m pt was notified that she can continue to take the 50-1000 then patient's daughter stated she picked up the Rx from the pharmacy and wanted to know if it is ok for her to take the [phone] I advised her to take it once daily per JM. YRL,RMA

## 2019-11-07 ENCOUNTER — Encounter (HOSPITAL_COMMUNITY): Payer: Self-pay

## 2019-11-07 ENCOUNTER — Ambulatory Visit (HOSPITAL_COMMUNITY): Admission: RE | Admit: 2019-11-07 | Payer: Medicare Other | Source: Ambulatory Visit

## 2019-11-12 ENCOUNTER — Telehealth: Payer: Self-pay

## 2019-11-12 NOTE — Telephone Encounter (Signed)
I left pt v/m to call the office so we can notify her that her placard form has been completed. YRL,RMA

## 2019-11-13 ENCOUNTER — Telehealth: Payer: Self-pay

## 2019-11-13 NOTE — Telephone Encounter (Signed)
Patient returned my call regarding her mother's placard forms and requested that I mailed them to her. I placed them in the mail and put a copy in the scan pile. YRL,RMA

## 2019-11-19 ENCOUNTER — Telehealth: Payer: Self-pay

## 2019-11-19 NOTE — Telephone Encounter (Signed)
Spoke w/pt daughter that appt date will not work for her. Per JM give her the phone number to the clinic to reschedule her moms appt to a day that will work for them [phone]  call Jamirra Wardlow give her her appt info for her barium swallow its on 11/23/19 she needs to be there at 10:45am nothing to drink or eat 3 hours before appt

## 2019-11-23 ENCOUNTER — Other Ambulatory Visit (HOSPITAL_COMMUNITY): Payer: Medicare Other

## 2019-11-27 ENCOUNTER — Ambulatory Visit: Payer: Medicare Other | Admitting: Pulmonary Disease

## 2019-12-10 LAB — HM PAP SMEAR

## 2019-12-10 LAB — HM MAMMOGRAPHY

## 2019-12-13 ENCOUNTER — Ambulatory Visit (HOSPITAL_COMMUNITY)
Admission: RE | Admit: 2019-12-13 | Discharge: 2019-12-13 | Disposition: A | Discharge status: Home / Self Care | Payer: Medicare Other | Source: Ambulatory Visit | Attending: Nurse Practitioner | Admitting: Nurse Practitioner

## 2019-12-13 ENCOUNTER — Other Ambulatory Visit: Payer: Self-pay

## 2019-12-13 DIAGNOSIS — R1310 Dysphagia, unspecified: Secondary | ICD-10-CM | POA: Diagnosis not present

## 2019-12-13 DIAGNOSIS — R1319 Other dysphagia: Secondary | ICD-10-CM

## 2020-01-07 ENCOUNTER — Other Ambulatory Visit: Payer: Self-pay

## 2020-01-07 ENCOUNTER — Ambulatory Visit (INDEPENDENT_AMBULATORY_CARE_PROVIDER_SITE_OTHER): Payer: Medicare Other | Admitting: Nurse Practitioner

## 2020-01-07 ENCOUNTER — Encounter: Payer: Self-pay | Admitting: Nurse Practitioner

## 2020-01-07 VITALS — BP 122/70 | HR 91 | Temp 97.6°F | Ht 61.6 in | Wt 157.0 lb

## 2020-01-07 DIAGNOSIS — J449 Chronic obstructive pulmonary disease, unspecified: Secondary | ICD-10-CM

## 2020-01-07 DIAGNOSIS — R413 Other amnesia: Secondary | ICD-10-CM

## 2020-01-07 DIAGNOSIS — E119 Type 2 diabetes mellitus without complications: Secondary | ICD-10-CM | POA: Diagnosis not present

## 2020-01-07 DIAGNOSIS — Z9981 Dependence on supplemental oxygen: Secondary | ICD-10-CM

## 2020-01-07 DIAGNOSIS — E7800 Pure hypercholesterolemia, unspecified: Secondary | ICD-10-CM

## 2020-01-07 DIAGNOSIS — I272 Pulmonary hypertension, unspecified: Secondary | ICD-10-CM | POA: Diagnosis not present

## 2020-01-07 DIAGNOSIS — R6889 Other general symptoms and signs: Secondary | ICD-10-CM

## 2020-01-07 MED ORDER — OMEPRAZOLE 40 MG PO CPDR: 40.0000 mg | DELAYED_RELEASE_CAPSULE | Freq: Every day | ORAL | 1 refills | Status: DC

## 2020-01-07 NOTE — Progress Notes (Signed)
Subjective:     Patient ID: Brianna Adams , female    DOB: April 11, 1941 , 79 y.o.   MRN: [id number]   Chief Complaint  Patient presents with  . Diabetes  . Hypertension    HPI  She has been using oxygen at home for about 2 years.  Uses at night.  [facility] in [place].  [name] (daughter) will call back with the number for the oxygen service here in [place].  She feels is at 2 l/m.  She is using sometimes during the day with walking and moving around.  She is walking 3 times per week will carry the portable.    She will use her oxygen during the day and when walking long distances.  Upon walking in to the office had shortness of breath with the distance.  Her ophthalmology provider will be [place].    Her daughter is going to call the pulmonologist for Pulmonary rehab.    Diabetes She presents for her follow-up diabetic visit. She has type 2 diabetes mellitus. Her disease course has been stable. Pertinent negatives for hypoglycemia include no dizziness. There are no hypoglycemic complications. There are no diabetic complications. Risk factors for coronary artery disease include hypertension and obesity. She is compliant with treatment all of the time. She has not had a previous visit with a dietitian. (Does not check regularly and has not for several years) She does not see a podiatrist.Eye exam is not current.  Hypertension This is a chronic problem. The current episode started more than 1 year ago. The problem is controlled. Pertinent negatives include no palpitations. There are no compliance problems.  There is no history of angina. There is no history of chronic renal disease.     Past Medical History:  Diagnosis Date  . COPD (chronic obstructive pulmonary disease) ([place])   . Diabetes ([place])   . Diverticulitis   . High cholesterol   . Lung cancer ([place])   . Pulmonary hypertension ([place])   . Uterine cancer ([place])      Family History  Problem Relation Age of  Onset  . Hypertension Mother   . Heart attack Father   . Cancer Maternal Aunt      Current Outpatient Medications:  .  acetaminophen (TYLENOL) 325 MG tablet, Take 325 mg by mouth every 6 (six) hours as needed., Disp: , Rfl:  .  albuterol (VENTOLIN HFA) 108 (90 Base) MCG/ACT inhaler, Inhale 2 puffs into the lungs every 6 (six) hours as needed for wheezing or shortness of breath., Disp: 18 g, Rfl: 3 .  CALCIUM PO, Take 1,200 mg by mouth daily at 12 noon. , Disp: , Rfl:  .  CINNAMON PO, Take 2,000 mg by mouth. , Disp: , Rfl:  .  glimepiride (AMARYL) 2 MG tablet, TAKE 1 TABLET BY MOUTH DAILY WITH BREAKFAST, Disp: 180 tablet, Rfl: 1 .  Multiple Vitamins-Minerals (CENTRUM SILVER PO), Take by mouth., Disp: , Rfl:  .  omeprazole (PRILOSEC) 40 MG capsule, Take 1 capsule (40 mg total) by mouth daily., Disp: 30 capsule, Rfl: 0 .  pravastatin (PRAVACHOL) 10 MG tablet, Take 1 tablet (10 mg total) by mouth daily., Disp: 30 tablet, Rfl: 0 .  SitaGLIPtin-MetFORMIN HCl (JANUMET XR) [id number] MG TB24, Take 1 tablet by mouth 2 (two) times daily., Disp: 180 tablet, Rfl: 1 .  umeclidinium-vilanterol (ANORO ELLIPTA) 62.5-25 MCG/INH AEPB, Inhale 1 puff into the lungs daily., Disp: 1 each, Rfl: 2 .  vitamin C (ASCORBIC ACID) 500  MG tablet, Take 500 mg by mouth daily. 4 times weekly, Disp: , Rfl:    Allergies  Allergen Reactions  . Penicillin G Shortness Of Breath    Other reaction(s): Rash  . Ace Inhibitors Swelling  . Atorvastatin     Other reaction(s): Myalgias (Muscle Pain) Other reaction(s): Myalgias (muscle pain)  . Cortisone     Other reaction(s): Angioedema  . Hydrocortisone Other (See Comments)  . Lisinopril     Other reaction(s): Angioedema  . Penicillins Hives  . Pravastatin     Other reaction(s): Chest Pain     Review of Systems  Constitutional: Negative.   Respiratory: Negative.   Cardiovascular: Negative.  Negative for palpitations and leg swelling.  Musculoskeletal: Negative.   Skin:  Negative.   Neurological: Negative for dizziness.  Psychiatric/Behavioral: Negative.      Today's Vitals   01/07/20 1412  BP: 122/70  Pulse: 91  Temp: 97.6 F (36.4 C)  TempSrc: Oral  SpO2: (!) 87%  Weight: 157 lb (71.2 kg)  Height: 5' 1.6" (1.565 m)  PainSc: 0-No pain   Body mass index is 29.09 kg/m.   Objective:  Physical Exam Vitals reviewed.  Constitutional:      Appearance: Normal appearance.  Cardiovascular:     Rate and Rhythm: Normal rate and regular rhythm.     Pulses: Normal pulses.     Heart sounds: Normal heart sounds. No murmur.  Pulmonary:     Effort: Pulmonary effort is normal. No respiratory distress.  Skin:    Capillary Refill: Capillary refill takes less than 2 seconds.  Neurological:     General: No focal deficit present.     Mental Status: She is alert and oriented to person, place, and time.  Psychiatric:        Mood and Affect: Mood normal.        Behavior: Behavior normal.        Thought Content: Thought content normal.        Judgment: Judgment normal.         Assessment And Plan:     1. Type 2 diabetes mellitus without complication, without long-term current use of insulin (HCC)  Chronic  Good control  Continue with current medications - omeprazole (PRILOSEC) 40 MG capsule; Take 1 capsule (40 mg total) by mouth daily.  Dispense: 90 capsule; Refill: 1 - CMP14+EGFR - Hemoglobin A1c  2. Chronic obstructive pulmonary disease, unspecified COPD type ([place])  She is being followed by pulmonology   3. Forgetfulness  Will check for metabolic cause  Encouraged to increase her vitamin B12 with a supplement and to continue to do word puzzles/suduko for brain exercises - Vitamin B12  4. Pulmonary hypertension, unspecified ([place])  Chronic, she is being followed by Cardiology   5. Elevated cholesterol  Chronic, stable.  Encouraged to continue to avoid fried and fatty foods. - Lipid panel   [name], [name]    THE PATIENT IS  ENCOURAGED TO PRACTICE SOCIAL DISTANCING DUE TO THE COVID-19 PANDEMIC.

## 2020-01-08 LAB — LIPID PANEL
Chol/HDL Ratio: 2.4 ratio (ref 0.0–4.4)
Cholesterol, Total: 149 mg/dL (ref 100–199)
HDL: 61 mg/dL (ref >39)
LDL Chol Calc (NIH): 73 mg/dL (ref 0–99)
Triglycerides: 80 mg/dL (ref 0–149)
VLDL Cholesterol Cal: 15 mg/dL (ref 5–40)

## 2020-01-08 LAB — CMP14+EGFR
ALT: 17 IU/L (ref 0–32)
AST: 19 IU/L (ref 0–40)
Albumin/Globulin Ratio: 1.5 (ref 1.2–2.2)
Albumin: 4.5 g/dL (ref 3.7–4.7)
Alkaline Phosphatase: 75 IU/L (ref 39–117)
BUN/Creatinine Ratio: 13 (ref 12–28)
BUN: 12 mg/dL (ref 8–27)
Bilirubin Total: 0.2 mg/dL (ref 0.0–1.2)
CO2: 21 mmol/L (ref 20–29)
Calcium: 9.9 mg/dL (ref 8.7–10.3)
Chloride: 105 mmol/L (ref 96–106)
Creatinine, Ser: 0.94 mg/dL (ref 0.57–1.00)
GFR calc Af Amer: 66 mL/min/{1.73_m2} (ref >59)
GFR calc non Af Amer: 57 mL/min/{1.73_m2} — ABNORMAL LOW (ref >59)
Globulin, Total: 3.1 g/dL (ref 1.5–4.5)
Glucose: 57 mg/dL — ABNORMAL LOW (ref 65–99)
Potassium: 4.9 mmol/L (ref 3.5–5.2)
Sodium: 142 mmol/L (ref 134–144)
Total Protein: 7.6 g/dL (ref 6.0–8.5)

## 2020-01-08 LAB — VITAMIN B12: Vitamin B-12: 1075 pg/mL (ref 232–1245)

## 2020-01-08 LAB — HEMOGLOBIN A1C
Est. average glucose Bld gHb Est-mCnc: 120 mg/dL
Hgb A1c MFr Bld: 5.8 % — ABNORMAL HIGH (ref 4.8–5.6)

## 2020-01-08 NOTE — Progress Notes (Signed)
She needs to stop taking now and monitor her blood sugars if goes higher than 150 to call the office

## 2020-01-10 ENCOUNTER — Other Ambulatory Visit: Payer: Self-pay

## 2020-01-10 MED ORDER — BLOOD GLUCOSE MONITOR KIT: PACK | 0 refills | Status: DC

## 2020-01-15 LAB — HM MAMMOGRAPHY

## 2020-01-17 ENCOUNTER — Encounter: Payer: Self-pay | Admitting: Nurse Practitioner

## 2020-01-23 ENCOUNTER — Encounter: Payer: Self-pay | Admitting: Nurse Practitioner

## 2020-02-18 ENCOUNTER — Other Ambulatory Visit: Payer: Self-pay

## 2020-02-18 ENCOUNTER — Encounter: Payer: Self-pay | Admitting: Primary Care

## 2020-02-18 ENCOUNTER — Ambulatory Visit (INDEPENDENT_AMBULATORY_CARE_PROVIDER_SITE_OTHER): Payer: Medicare Other | Admitting: Primary Care

## 2020-02-18 VITALS — BP 120/70 | HR 83 | Temp 98.1°F | Ht 62.0 in | Wt 156.6 lb

## 2020-02-18 DIAGNOSIS — J449 Chronic obstructive pulmonary disease, unspecified: Secondary | ICD-10-CM

## 2020-02-18 DIAGNOSIS — R0600 Dyspnea, unspecified: Secondary | ICD-10-CM

## 2020-02-18 DIAGNOSIS — Z85118 Personal history of other malignant neoplasm of bronchus and lung: Secondary | ICD-10-CM | POA: Diagnosis not present

## 2020-02-18 DIAGNOSIS — R911 Solitary pulmonary nodule: Secondary | ICD-10-CM | POA: Insufficient documentation

## 2020-02-18 DIAGNOSIS — J9611 Chronic respiratory failure with hypoxia: Secondary | ICD-10-CM | POA: Insufficient documentation

## 2020-02-18 DIAGNOSIS — R0609 Other forms of dyspnea: Secondary | ICD-10-CM

## 2020-02-18 DIAGNOSIS — K219 Gastro-esophageal reflux disease without esophagitis: Secondary | ICD-10-CM

## 2020-02-18 NOTE — Assessment & Plan Note (Signed)
-  O2 86% on room air today; improved to 95% on 2L - Encourage patient to get a pulse oximeter to monitor oxygen levels - Recommend patient wear 1-2L on exertion to keep O2 >88% and continue 1L oxygen at night  - Check ONO on 1L

## 2020-02-18 NOTE — Assessment & Plan Note (Addendum)
-  Hx squamous cell carcinoma lung, s/p RLL wedge resection  - CT chest in January 2021 showed moderate emphysema, no convincing evidence of tumor re-occurance. She did have a small mediastinal nodule that is stable versus slightly increased from her prior PET - Needs repeat CT chest wo contrast in July 2021 for 6 month follow-up

## 2020-02-18 NOTE — Progress Notes (Signed)
_0  ID: Brianna Adams, female    DOB: February 22, 1941, 79 y.o.   MRN: [id number]  Chief Complaint  Patient presents with  . Follow-up    sob with less exertion.  cough after meals, reflux.    Referring provider: [name], [name]  HPI: 79 year old female, former smoker quit 2017.  Past medical history significant for chronic obstructive pulmonary disease, pulmonary hypertension, diabetes type 2, squamous cell carcinoma of the lung.  Patient of Dr. [name], last seen January 2021. Pulmonary function test done in 2019 showed no obvious signs of obstruction or restriction.  Post -bronchodilator FEV1 1.73 (93%), ratio 74. Ordered for CT chest, given trial of Anoro Ellipta.  Recommended follow-up in 2 months  02/18/2020 Patient presents today for regular follow-up.  CT chest obtained in January 2021 showed emphysema, no convincing evidence of tumor re-occurance. She did have a small mediastinal nodule that is stable versus slightly increased from her prior PET.   Accompanied by her daughter. She did not notice a significant improvement in her breathing on Anoro but does prefer it to Spiriva. She would like a refer to pulmonary rehab. She has home oxygen, she wears 1L at night. She has portable oxygen machines which she uses when she needs. Her O2 level was 86% today on room air which came up to 95% on 2L. She does not have a pulse oximeter at home. Her POC is in the car today. She reports decrease in her endurance, states that when she even walks a short distance she will get out of breath. Daughter states that she is fairly physically fit and tends to walk very fast then becomes short of breath. She takes omeprazole 94m in the morning for reflux. She is on Janumet for her diabetes, glimepiride was discontinued. Denies chest tightness, wheezing or cough. She has had both COVID vaccine.   Imaging: 12/09/19 CT chest:  Moderate centrilobular and paraseptal emphysematous changes, upper lung predominant.  Small mediastinal nodules, including 182mshort axis AP window node, stable versus minimally increased from PET. No convincing findings to suggest tumor recurrence.   Allergies  Allergen Reactions  . Penicillin G Shortness Of Breath    Other reaction(s): Rash  . Ace Inhibitors Swelling  . Atorvastatin     Other reaction(s): Myalgias (Muscle Pain) Other reaction(s): Myalgias (muscle pain)  . Cortisone     Other reaction(s): Angioedema  . Hydrocortisone Other (See Comments)  . Lisinopril     Other reaction(s): Angioedema  . Penicillins Hives  . Pravastatin     Other reaction(s): Chest Pain    Immunization History  Administered Date(s) Administered  . Fluad Quad(high Dose 65+) 05/24/2019  . Influenza-Unspecified 06/06/2018  . PFIZER SARS-COV-2 Vaccination 10/20/2019, 11/19/2019  . Pneumococcal Conjugate-13 10/31/2013  . Pneumococcal Polysaccharide-23 11/05/2007  . Td 09/07/2003  . Tdap 08/17/2017  . Zoster 11/05/2010  . Zoster Recombinat (Shingrix) 08/17/2017, 06/28/2019, 09/05/2019    Past Medical History:  Diagnosis Date  . COPD (chronic obstructive pulmonary disease) ([facility]  . Diabetes ([facility]  . Diverticulitis   . High cholesterol   . Lung cancer ([facility]  . Pulmonary hypertension ([facility]  . Uterine cancer ([facility]    Tobacco History: Social History   Tobacco Use  Smoking Status Former Smoker  . Packs/day: 1.00  . Years: 58.00  . Pack years: 58.00  . Types: Cigarettes  . Quit date: 10/22/2015  . Years since quitting: 4.3  Smokeless Tobacco Never Used   Counseling given: Not  Answered   Outpatient Medications Prior to Visit  Medication Sig Dispense Refill  . acetaminophen (TYLENOL) 325 MG tablet Take 325 mg by mouth every 6 (six) hours as needed.    Marland Kitchen albuterol (VENTOLIN HFA) 108 (90 Base) MCG/ACT inhaler Inhale 2 puffs into the lungs every 6 (six) hours as needed for wheezing or shortness of breath. 18 g 3  . CALCIUM PO Take 1,200 mg by mouth daily at 12 noon.      Marland Kitchen CINNAMON PO Take 2,000 mg by mouth.     . Multiple Vitamins-Minerals (CENTRUM SILVER PO) Take by mouth.    Marland Kitchen omeprazole (PRILOSEC) 40 MG capsule Take 1 capsule (40 mg total) by mouth daily. 90 capsule 1  . pravastatin (PRAVACHOL) 10 MG tablet Take 1 tablet (10 mg total) by mouth daily. 30 tablet 0  . SitaGLIPtin-MetFORMIN HCl (JANUMET XR) [phone] MG TB24 Take 1 tablet by mouth 2 (two) times daily. 180 tablet 1  . umeclidinium-vilanterol (ANORO ELLIPTA) 62.5-25 MCG/INH AEPB Inhale 1 puff into the lungs daily. 1 each 2  . vitamin C (ASCORBIC ACID) 500 MG tablet Take 500 mg by mouth daily. 4 times weekly    . blood glucose meter kit and supplies KIT Use to check blood sugars daily. E11.9 (Patient not taking: Reported on 02/18/2020) 1 each 0  . glimepiride (AMARYL) 2 MG tablet TAKE 1 TABLET BY MOUTH DAILY WITH BREAKFAST (Patient not taking: Reported on 02/18/2020) 180 tablet 1   No facility-administered medications prior to visit.    Review of Systems  Review of Systems  Respiratory: Positive for shortness of breath. Negative for chest tightness and wheezing.   Cardiovascular: Negative.    Physical Exam  BP 120/70 (BP Location: Right Arm, Cuff Size: Normal)   Pulse 83   Temp 98.1 F (36.7 C) (Oral)   Ht _0  (1.575 m)   Wt 156 lb 9.6 oz (71 kg)   SpO2 (!) 86% Comment: 86% on RA, placed on 2L [place], 95% on 2L  BMI 28.64 kg/m  Physical Exam Constitutional:      Appearance: Normal appearance.  HENT:     Head: Normocephalic and atraumatic.     Right Ear: Tympanic membrane normal.     Left Ear: Tympanic membrane normal.     Mouth/Throat:     Mouth: Mucous membranes are moist.     Pharynx: Oropharynx is clear.  Cardiovascular:     Rate and Rhythm: Normal rate and regular rhythm.  Pulmonary:     Effort: Pulmonary effort is normal.     Breath sounds: Normal breath sounds.     Comments: Lungs mostly clear throughout. O2 86% on RA; 95% on 2L Skin:    General: Skin is warm and  dry.  Neurological:     General: No focal deficit present.     Mental Status: She is alert and oriented to person, place, and time. Mental status is at baseline.  Psychiatric:        Mood and Affect: Mood normal.        Behavior: Behavior normal.        Thought Content: Thought content normal.        Judgment: Judgment normal.      Lab Results:  CBC    Component Value Date/Time   WBC 6.2 04/26/2018 1118   RBC 4.33 04/26/2018 1118   HGB 11.8 (L) 04/26/2018 1118   HCT 35.4 04/26/2018 1118   PLT 268 04/26/2018 1118   MCV 81.9  04/26/2018 1118   MCH 27.4 04/26/2018 1118   MCHC 33.4 04/26/2018 1118   RDW 17.4 (H) 04/26/2018 1118   LYMPHSABS 1.8 04/26/2018 1118   MONOABS 0.7 04/26/2018 1118   EOSABS 0.2 04/26/2018 1118   BASOSABS 0.1 04/26/2018 1118    BMET    Component Value Date/Time   NA 142 01/07/2020 1453   K 4.9 01/07/2020 1453   CL 105 01/07/2020 1453   CO2 21 01/07/2020 1453   GLUCOSE 57 (L) 01/07/2020 1453   BUN 12 01/07/2020 1453   CREATININE 0.94 01/07/2020 1453   CALCIUM 9.9 01/07/2020 1453   GFRNONAA 57 (L) 01/07/2020 1453   GFRAA 66 01/07/2020 1453    BNP    Component Value Date/Time   BNP 57.0 04/26/2018 1118    ProBNP No results found for: PROBNP  Imaging: No results found.   Assessment & Plan:   Chronic obstructive pulmonary disease ([place]) - Continue ANORO Ellipta one puff daily - Refer to pulmonary rehab - Repeat PFTs in 6 months and OV with Dr. [name] or APP   Chronic respiratory failure with hypoxia ([place]) - O2 86% on room air today; improved to 95% on 2L - Encourage patient to get a pulse oximeter to monitor oxygen levels - Recommend patient wear 1-2L on exertion to keep O2 >88% and continue 1L oxygen at night  - Check ONO on 1L   Pulmonary nodule - Hx squamous cell carcinoma lung, s/p RLL wedge resection  - CT chest in January 2021 showed moderate emphysema, no convincing evidence of tumor re-occurance. She did have a small  mediastinal nodule that is stable versus slightly increased from her prior PET - Needs repeat CT chest wo contrast in July 2021 for 6 month follow-up  GERD (gastroesophageal reflux disease) - Denies overt aspiration - Continue omeprazole 22m daily    EMartyn Ehrich NP 02/18/2020

## 2020-02-18 NOTE — Assessment & Plan Note (Addendum)
-  Denies overt aspiration - Continue omeprazole 39m daily

## 2020-02-18 NOTE — Patient Instructions (Addendum)
Recommendations Continue Anoro 1 puff daily Get pulse oximeter to monitor oxygen levels goal 88 to 92% Wear 2 L oxygen on exertion   Order: ONO on 1L oxygen  CT chest wo contrast in 1 month re: pulmonary lung nodule   Referral: Pulmonary rehab re: emphysema/chronic respiratory failure  Follow-up: 6 months with Dr. [name] or APP

## 2020-02-18 NOTE — Addendum Note (Signed)
Addended by: Vanessa Barbara on: 02/18/2020 12:35 PM   Modules accepted: Orders

## 2020-02-18 NOTE — Assessment & Plan Note (Signed)
-  Continue ANORO Ellipta one puff daily - Refer to pulmonary rehab - Repeat PFTs in 6 months and OV with Dr. [name] or APP

## 2020-02-21 ENCOUNTER — Other Ambulatory Visit: Payer: Self-pay | Admitting: *Deleted

## 2020-02-21 DIAGNOSIS — J439 Emphysema, unspecified: Secondary | ICD-10-CM

## 2020-02-21 NOTE — Progress Notes (Signed)
Agree with the details of the visit as below.  Agree with pulmonary rehabilitation.  Brianna Don, MD [facility]

## 2020-02-26 ENCOUNTER — Ambulatory Visit: Payer: Medicare Other | Admitting: Nurse Practitioner

## 2020-02-28 ENCOUNTER — Other Ambulatory Visit: Payer: Self-pay

## 2020-02-28 ENCOUNTER — Encounter: Payer: Self-pay | Admitting: *Deleted

## 2020-02-28 ENCOUNTER — Encounter: Payer: Medicare Other | Attending: Pulmonary Disease | Admitting: *Deleted

## 2020-02-28 DIAGNOSIS — J439 Emphysema, unspecified: Secondary | ICD-10-CM

## 2020-02-28 NOTE — Addendum Note (Signed)
Addended by: Claudette Head A on: 02/28/2020 02:32 PM   Modules accepted: Orders

## 2020-02-28 NOTE — Progress Notes (Signed)
Virtual orientation call completed today. She has an appointment on Date: 03/04/2020 for EP eval and gym Orientation.  Documentation of diagnosis can be found in  Date: [facility] 02/18/2020.

## 2020-02-29 ENCOUNTER — Other Ambulatory Visit: Payer: Self-pay

## 2020-02-29 ENCOUNTER — Encounter: Payer: Self-pay | Admitting: Internal Medicine

## 2020-02-29 ENCOUNTER — Emergency Department: Payer: Medicare Other

## 2020-02-29 ENCOUNTER — Inpatient Hospital Stay
Admit: 2020-02-29 | Discharge: 2020-03-03 | DRG: 175 | Disposition: A | Discharge status: Home Health | Payer: Medicare Other | Attending: Internal Medicine | Admitting: Internal Medicine

## 2020-02-29 ENCOUNTER — Inpatient Hospital Stay: Payer: Medicare Other

## 2020-02-29 DIAGNOSIS — I5031 Acute diastolic (congestive) heart failure: Secondary | ICD-10-CM | POA: Diagnosis present

## 2020-02-29 DIAGNOSIS — Z85118 Personal history of other malignant neoplasm of bronchus and lung: Secondary | ICD-10-CM | POA: Diagnosis not present

## 2020-02-29 DIAGNOSIS — Z8616 Personal history of COVID-19: Secondary | ICD-10-CM

## 2020-02-29 DIAGNOSIS — J440 Chronic obstructive pulmonary disease with acute lower respiratory infection: Secondary | ICD-10-CM | POA: Diagnosis present

## 2020-02-29 DIAGNOSIS — Z9981 Dependence on supplemental oxygen: Secondary | ICD-10-CM

## 2020-02-29 DIAGNOSIS — R0602 Shortness of breath: Secondary | ICD-10-CM | POA: Diagnosis present

## 2020-02-29 DIAGNOSIS — J9621 Acute and chronic respiratory failure with hypoxia: Secondary | ICD-10-CM | POA: Diagnosis present

## 2020-02-29 DIAGNOSIS — Z880 Allergy status to penicillin: Secondary | ICD-10-CM

## 2020-02-29 DIAGNOSIS — I2699 Other pulmonary embolism without acute cor pulmonale: Secondary | ICD-10-CM | POA: Diagnosis present

## 2020-02-29 DIAGNOSIS — I2609 Other pulmonary embolism with acute cor pulmonale: Secondary | ICD-10-CM

## 2020-02-29 DIAGNOSIS — Z79899 Other long term (current) drug therapy: Secondary | ICD-10-CM | POA: Diagnosis not present

## 2020-02-29 DIAGNOSIS — I509 Heart failure, unspecified: Secondary | ICD-10-CM

## 2020-02-29 DIAGNOSIS — Z8542 Personal history of malignant neoplasm of other parts of uterus: Secondary | ICD-10-CM

## 2020-02-29 DIAGNOSIS — E7800 Pure hypercholesterolemia, unspecified: Secondary | ICD-10-CM | POA: Diagnosis present

## 2020-02-29 DIAGNOSIS — I272 Pulmonary hypertension, unspecified: Secondary | ICD-10-CM | POA: Diagnosis present

## 2020-02-29 DIAGNOSIS — I2601 Septic pulmonary embolism with acute cor pulmonale: Secondary | ICD-10-CM | POA: Diagnosis not present

## 2020-02-29 DIAGNOSIS — Z8249 Family history of ischemic heart disease and other diseases of the circulatory system: Secondary | ICD-10-CM | POA: Diagnosis not present

## 2020-02-29 DIAGNOSIS — Z888 Allergy status to other drugs, medicaments and biological substances status: Secondary | ICD-10-CM

## 2020-02-29 DIAGNOSIS — J181 Lobar pneumonia, unspecified organism: Secondary | ICD-10-CM | POA: Diagnosis present

## 2020-02-29 DIAGNOSIS — E119 Type 2 diabetes mellitus without complications: Secondary | ICD-10-CM | POA: Diagnosis present

## 2020-02-29 DIAGNOSIS — R591 Generalized enlarged lymph nodes: Secondary | ICD-10-CM

## 2020-02-29 DIAGNOSIS — I5032 Chronic diastolic (congestive) heart failure: Secondary | ICD-10-CM

## 2020-02-29 DIAGNOSIS — Z87891 Personal history of nicotine dependence: Secondary | ICD-10-CM | POA: Diagnosis not present

## 2020-02-29 HISTORY — DX: Other pulmonary embolism without acute cor pulmonale: I26.99

## 2020-02-29 LAB — TROPONIN I (HIGH SENSITIVITY)
Troponin I (High Sensitivity): 520 ng/L (ref <18)
Troponin I (High Sensitivity): 497 ng/L (ref <18)

## 2020-02-29 LAB — CBC WITH DIFFERENTIAL/PLATELET
Abs Immature Granulocytes: 0.06 10*3/uL (ref 0.00–0.07)
Basophils Absolute: 0.1 10*3/uL (ref 0.0–0.1)
Basophils Relative: 0 %
Eosinophils Absolute: 0 10*3/uL (ref 0.0–0.5)
Eosinophils Relative: 0 %
HCT: 34.3 % — ABNORMAL LOW (ref 36.0–46.0)
Hemoglobin: 11.6 g/dL — ABNORMAL LOW (ref 12.0–15.0)
Immature Granulocytes: 1 %
Lymphocytes Relative: 7 %
Lymphs Abs: 0.9 10*3/uL (ref 0.7–4.0)
MCH: 28 pg (ref 26.0–34.0)
MCHC: 33.8 g/dL (ref 30.0–36.0)
MCV: 82.7 fL (ref 80.0–100.0)
Monocytes Absolute: 0.8 10*3/uL (ref 0.1–1.0)
Monocytes Relative: 6 %
Neutro Abs: 10.8 10*3/uL — ABNORMAL HIGH (ref 1.7–7.7)
Neutrophils Relative %: 86 %
Platelets: 227 10*3/uL (ref 150–400)
RBC: 4.15 MIL/uL (ref 3.87–5.11)
RDW: 17 % — ABNORMAL HIGH (ref 11.5–15.5)
WBC: 12.6 10*3/uL — ABNORMAL HIGH (ref 4.0–10.5)
nRBC: 0 % (ref 0.0–0.2)

## 2020-02-29 LAB — BLOOD GAS, VENOUS
Acid-base deficit: 4 mmol/L — ABNORMAL HIGH (ref 0.0–2.0)
Bicarbonate: 19 mmol/L — ABNORMAL LOW (ref 20.0–28.0)
O2 Saturation: 99.6 %
Patient temperature: 37
pCO2, Ven: 28 mmHg — ABNORMAL LOW (ref 44.0–60.0)
pH, Ven: 7.44 — ABNORMAL HIGH (ref 7.250–7.430)
pO2, Ven: 181 mmHg — ABNORMAL HIGH (ref 32.0–45.0)

## 2020-02-29 LAB — GLUCOSE, CAPILLARY: Glucose-Capillary: 168 mg/dL — ABNORMAL HIGH (ref 70–99)

## 2020-02-29 LAB — BASIC METABOLIC PANEL
Anion gap: 12 (ref 5–15)
BUN: 13 mg/dL (ref 8–23)
CO2: 19 mmol/L — ABNORMAL LOW (ref 22–32)
Calcium: 8.8 mg/dL — ABNORMAL LOW (ref 8.9–10.3)
Chloride: 103 mmol/L (ref 98–111)
Creatinine, Ser: 0.92 mg/dL (ref 0.44–1.00)
GFR calc Af Amer: >60 mL/min (ref >60)
GFR calc non Af Amer: 59 mL/min — ABNORMAL LOW (ref >60)
Glucose, Bld: 190 mg/dL — ABNORMAL HIGH (ref 70–99)
Potassium: 3.9 mmol/L (ref 3.5–5.1)
Sodium: 134 mmol/L — ABNORMAL LOW (ref 135–145)

## 2020-02-29 LAB — MRSA PCR SCREENING: MRSA by PCR: NEGATIVE

## 2020-02-29 LAB — SARS CORONAVIRUS 2 BY RT PCR (HOSPITAL ORDER, PERFORMED IN ~~LOC~~ HOSPITAL LAB): SARS Coronavirus 2: NEGATIVE

## 2020-02-29 LAB — PROTIME-INR
INR: 1.2 (ref 0.8–1.2)
Prothrombin Time: 15 seconds (ref 11.4–15.2)

## 2020-02-29 LAB — BRAIN NATRIURETIC PEPTIDE: B Natriuretic Peptide: 1357.6 pg/mL — ABNORMAL HIGH (ref 0.0–100.0)

## 2020-02-29 LAB — APTT: aPTT: 30 seconds (ref 24–36)

## 2020-02-29 LAB — PROCALCITONIN: Procalcitonin: 1.11 ng/mL

## 2020-02-29 MED ORDER — IPRATROPIUM-ALBUTEROL 0.5-2.5 (3) MG/3ML IN SOLN
9.0000 mL | Freq: Once | RESPIRATORY_TRACT | Status: AC
  Administered 2020-02-29: 9 mL via RESPIRATORY_TRACT
  Filled 2020-02-29: qty 3

## 2020-02-29 MED ORDER — SODIUM CHLORIDE 0.9 % IV SOLN: 250.0000 mL | INTRAVENOUS | Status: DC | PRN

## 2020-02-29 MED ORDER — CHLORHEXIDINE GLUCONATE CLOTH 2 % EX PADS
6.0000 | MEDICATED_PAD | Freq: Every day | CUTANEOUS | Status: DC
  Administered 2020-02-29 – 2020-03-03 (×3): 6 via TOPICAL

## 2020-02-29 MED ORDER — POLYETHYLENE GLYCOL 3350 17 G PO PACK: 17.0000 g | PACK | Freq: Every day | ORAL | Status: DC | PRN

## 2020-02-29 MED ORDER — IPRATROPIUM-ALBUTEROL 0.5-2.5 (3) MG/3ML IN SOLN: 3.0000 mL | Freq: Once | RESPIRATORY_TRACT | Status: DC

## 2020-02-29 MED ORDER — ONDANSETRON HCL 4 MG/2ML IJ SOLN: 4.0000 mg | Freq: Four times a day (QID) | INTRAMUSCULAR | Status: DC | PRN

## 2020-02-29 MED ORDER — HEPARIN BOLUS VIA INFUSION
3900.0000 [IU] | Freq: Once | INTRAVENOUS | Status: AC
  Administered 2020-02-29: 3900 [IU] via INTRAVENOUS
  Filled 2020-02-29: qty 3900

## 2020-02-29 MED ORDER — ACETAMINOPHEN 325 MG PO TABS
650.0000 mg | ORAL_TABLET | ORAL | Status: DC | PRN
  Administered 2020-03-01: 650 mg via ORAL
  Filled 2020-02-29: qty 2

## 2020-02-29 MED ORDER — HEPARIN (PORCINE) 25000 UT/250ML-% IV SOLN: 800.0000 [IU]/h | INTRAVENOUS | Status: DC

## 2020-02-29 MED ORDER — IPRATROPIUM-ALBUTEROL 0.5-2.5 (3) MG/3ML IN SOLN
RESPIRATORY_TRACT | Status: AC
  Filled 2020-02-29: qty 6

## 2020-02-29 MED ORDER — HEPARIN (PORCINE) 25000 UT/250ML-% IV SOLN
INTRAVENOUS | Status: AC
  Administered 2020-02-29: 800 [IU]/h via INTRAVENOUS
  Filled 2020-02-29: qty 250

## 2020-02-29 MED ORDER — DOCUSATE SODIUM 100 MG PO CAPS: 100.0000 mg | ORAL_CAPSULE | Freq: Two times a day (BID) | ORAL | Status: DC | PRN

## 2020-02-29 MED ORDER — FAMOTIDINE IN NACL 20-0.9 MG/50ML-% IV SOLN
20.0000 mg | Freq: Two times a day (BID) | INTRAVENOUS | Status: DC
  Administered 2020-02-29 – 2020-03-02 (×4): 20 mg via INTRAVENOUS
  Filled 2020-02-29 (×4): qty 50

## 2020-02-29 MED ORDER — INSULIN ASPART 100 UNIT/ML ~~LOC~~ SOLN
0.0000 [IU] | Freq: Three times a day (TID) | SUBCUTANEOUS | Status: DC
  Administered 2020-03-01: 3 [IU] via SUBCUTANEOUS
  Administered 2020-03-01 (×2): 2 [IU] via SUBCUTANEOUS
  Administered 2020-03-03: 13:00:00 3 [IU] via SUBCUTANEOUS
  Administered 2020-03-03: 09:00:00 2 [IU] via SUBCUTANEOUS
  Filled 2020-02-29 (×5): qty 1

## 2020-02-29 MED ORDER — FUROSEMIDE 10 MG/ML IJ SOLN
40.0000 mg | Freq: Once | INTRAMUSCULAR | Status: AC
  Administered 2020-02-29: 40 mg via INTRAVENOUS
  Filled 2020-02-29: qty 4

## 2020-02-29 MED ORDER — SODIUM CHLORIDE 0.9% FLUSH: 3.0000 mL | INTRAVENOUS | Status: DC | PRN

## 2020-02-29 MED ORDER — SODIUM CHLORIDE 0.9% FLUSH
3.0000 mL | Freq: Two times a day (BID) | INTRAVENOUS | Status: DC
  Administered 2020-02-29 – 2020-03-03 (×6): 3 mL via INTRAVENOUS

## 2020-02-29 MED ORDER — LEVOFLOXACIN IN D5W 750 MG/150ML IV SOLN
750.0000 mg | Freq: Once | INTRAVENOUS | Status: AC
  Administered 2020-02-29: 750 mg via INTRAVENOUS
  Filled 2020-02-29: qty 150

## 2020-02-29 MED ORDER — INSULIN ASPART 100 UNIT/ML ~~LOC~~ SOLN: 0.0000 [IU] | Freq: Every day | SUBCUTANEOUS | Status: DC

## 2020-02-29 MED ORDER — IOHEXOL 350 MG/ML SOLN
75.0000 mL | Freq: Once | INTRAVENOUS | Status: AC | PRN
  Administered 2020-02-29: 75 mL via INTRAVENOUS

## 2020-02-29 NOTE — Consult Note (Signed)
CARDIOLOGY CONSULT NOTE               Patient ID: Brianna Adams MRN: [id number] DOB/AGE: 1940-09-14 79 y.o.  Admit date: 02/29/2020 Referring Physician Dr. Blake Divine Primary Physician [name] [name] Primary Cardiologist N/A Reason for Consultation Elevated troponin, acute CHF  HPI: Brianna Adams is an 79 year old female with a past medical history significant for oxygen dependent COPD, pulmonary hypertension, lung cancer s/p right lower lobe resection, and type 2 diabetes who presented to the ED on 02/29/20 for a 2 week history of progressive shortness of breath.  Workup in the ED has been significant for chest xray revealing possible interstitial pulmonary edema, O2 sats on 2L of O2 reported in the 60s, BNP of 1357.6, high sensitivity troponin elevated x 2 at 520 and 497 respectively, and ECG revealing sinus tachycardia with diffuse ST depressions.   Review of systems complete and found to be negative unless listed above    Past Medical History:  Diagnosis Date  . COPD (chronic obstructive pulmonary disease) ([place])   . Diabetes ([place])   . Diverticulitis   . High cholesterol   . Lung cancer ([place])   . Pulmonary hypertension ([place])   . Uterine cancer [facility])     Past Surgical History:  Procedure Laterality Date  . ABDOMINAL HYSTERECTOMY    . cataract surgery  2012 and 2017  . FIBEROPTIC BRONCHOSCOPY    . resection of lung cancer  2018  . right lower lobe non-anatomocal lung resection wedge    . right thorascoscopy      (Not in a hospital admission)  Social History   Socioeconomic History  . Marital status: Widowed    Spouse name: Not on file  . Number of children: Not on file  . Years of education: Not on file  . Highest education level: Not on file  Occupational History  . Occupation: retired  Tobacco Use  . Smoking status: Former Smoker    Packs/day: 1.00    Years: 58.00    Pack years: 58.00    Types: Cigarettes    Quit date: 10/22/2015    Years since quitting:  4.3  . Smokeless tobacco: Never Used  Vaping Use  . Vaping Use: Never used  Substance and Sexual Activity  . Alcohol use: Never  . Drug use: Never  . Sexual activity: Not Currently  Other Topics Concern  . Not on file  Social History Narrative  . Not on file   Social Determinants of Health   Financial Resource Strain: Low Risk   . Difficulty of Paying Living Expenses: Not hard at all  Food Insecurity: No Food Insecurity  . Worried About Charity fundraiser in the Last Year: Never true  . Ran Out of Food in the Last Year: Never true  Transportation Needs: No Transportation Needs  . Lack of Transportation (Medical): No  . Lack of Transportation (Non-Medical): No  Physical Activity: Insufficiently Active  . Days of Exercise per Week: 2 days  . Minutes of Exercise per Session: 10 min  Stress: No Stress Concern Present  . Feeling of Stress : Not at all  Social Connections:   . Frequency of Communication with Friends and Family:   . Frequency of Social Gatherings with Friends and Family:   . Attends Religious Services:   . Active Member of Clubs or Organizations:   . Attends Archivist Meetings:   Marland Kitchen Marital Status:   Intimate Partner Violence:   .  Fear of Current or Ex-Partner:   . Emotionally Abused:   Marland Kitchen Physically Abused:   . Sexually Abused:     Family History  Problem Relation Age of Onset  . Hypertension Mother   . Heart attack Father   . Cancer Maternal Aunt       Review of systems complete and found to be negative unless listed above      PHYSICAL EXAM  General: Well developed, well nourished, in no acute distress HEENT:  Normocephalic and atramatic Neck:  No JVD.  Lungs: On 6L [place]. Clear bilaterally to auscultation and percussion. Heart: HRRR . Normal S1 and S2 without gallops or murmurs.  Abdomen: Bowel sounds are positive, abdomen soft and non-tender  Msk:  Back normal.  Normal strength and tone for age. Extremities: No clubbing or cyanosis.  Mild peripheral edema in right lower extremity.  Neuro: Alert and oriented X 3. Psych:  Good affect, responds appropriately  Labs:   Lab Results  Component Value Date   WBC 12.6 (H) 02/29/2020   HGB 11.6 (L) 02/29/2020   HCT 34.3 (L) 02/29/2020   MCV 82.7 02/29/2020   PLT 227 02/29/2020    Recent Labs  Lab 02/29/20 1304  NA 134*  K 3.9  CL 103  CO2 19*  BUN 13  CREATININE 0.92  CALCIUM 8.8*  GLUCOSE 190*   No results found for: CKTOTAL, CKMB, CKMBINDEX, TROPONINI  Lab Results  Component Value Date   CHOL 149 01/07/2020   CHOL 152 10/04/2019   Lab Results  Component Value Date   HDL 61 01/07/2020   HDL 57 10/04/2019   Lab Results  Component Value Date   LDLCALC 73 01/07/2020   LDLCALC 80 10/04/2019   Lab Results  Component Value Date   TRIG 80 01/07/2020   TRIG 76 10/04/2019   Lab Results  Component Value Date   CHOLHDL 2.4 01/07/2020   CHOLHDL 2.7 10/04/2019   No results found for: LDLDIRECT    Radiology: DG Chest Portable 1 View  Result Date: 02/29/2020 CLINICAL DATA:  Shortness of breath. History of lung carcinoma and chronic lung disease. EXAM: PORTABLE CHEST 1 VIEW COMPARISON:  CT of the chest on 09/27/2019 FINDINGS: The heart size is at the upper limits of normal. Diffuse bronchial and interstitial prominence present likely on the basis significant chronic lung disease. There may be a component of interstitial pulmonary edema. No significant pleural effusions. No pneumothorax. IMPRESSION: Diffuse bronchial and interstitial prominence likely on the basis of chronic lung disease. There may be a component of interstitial pulmonary edema. Electronically Signed   By: Aletta Edouard M.D.   On: 02/29/2020 13:19    EKG: Sinus tachycardia at a ventricular rate of 103bpm with diffuse ST depressions   ASSESSMENT AND PLAN:   1.  Acute CHF   -BNP elevated, probable pulmonary edema on chest xray   -Will obtain echocardiogram   2.  Elevated troponin    -Likely demand ischemia in the setting of acute respiratory failure, PE  -Patient denies chest pain, reports progressive symptoms over the past 2 weeks; low suspicion for ACS  -Echocardiogram pending   3.  Pulmonary Embolism   -Chest CT positive for PE with evidence of right heart strain   The history, physical exam findings, and plan of care were all discussed with Dr. Bartholome Bill, and all decision making was made in collaboration.   Signed: Avie Arenas PA-C 02/29/2020, 3:33 PM

## 2020-02-29 NOTE — ED Notes (Signed)
Lab called to collect patient blood due to patient being difficult stick.

## 2020-02-29 NOTE — ED Triage Notes (Signed)
First RN Note: Pt presents to ED via [place] with her daughter. Pt noted to be tachypneic upon arrival to ED, pt pulled to triage 1 to begin triage by this RN. Upon arrival to triage 1, pt with O2 sats 62% on chronic 2L with a good pleth. Pt's daughter reports increasing fatigue since yesterday. Pt taken immediately back to room 16 by Theadora Rama, RN and [place], EDT.

## 2020-02-29 NOTE — ED Notes (Signed)
Lab at bedside

## 2020-02-29 NOTE — ED Notes (Addendum)
Waiting aPTT to be drawn by lab before starting heparin drip per pharmacy.

## 2020-02-29 NOTE — ED Notes (Signed)
Lab called for repeat troponin blood draw

## 2020-02-29 NOTE — ED Triage Notes (Signed)
Pt reports for the past 2 weeks she has been having increased sob and has been very sleepy, pt states that she is usually on 2L of O2, sat in the 60's noted in triage, pt placed on 6L of O2 via [place] in triage and pt brought back to room 16

## 2020-02-29 NOTE — Progress Notes (Signed)
ANTICOAGULATION CONSULT NOTE - Initial Consult  Pharmacy Consult for Heparin Indication: chest pain/ACS  Allergies  Allergen Reactions  . Penicillin G Shortness Of Breath    Other reaction(s): Rash  . Ace Inhibitors Swelling  . Atorvastatin     Other reaction(s): Myalgias (Muscle Pain) Other reaction(s): Myalgias (muscle pain)  . Cortisone     Other reaction(s): Angioedema  . Hydrocortisone Other (See Comments)  . Lisinopril     Other reaction(s): Angioedema  . Meloxicam Other (See Comments)    Bloody stools  . Penicillins Hives  . Pravastatin     Other reaction(s): Chest Pain    Patient Measurements: Height: 5' 2" (157.5 cm) Weight: 71 kg (156 lb 9.6 oz) IBW/kg (Calculated) : 50.1 Heparin Dosing Weight: 65.1 kg  Vital Signs: Temp: 97.9 F (36.6 C) (06/25 1218) Temp Source: Oral (06/25 1218) BP: 118/65 (06/25 1300) Pulse Rate: 105 (06/25 1300)  Labs: Recent Labs    02/29/20 1304  HGB 11.6*  HCT 34.3*  PLT 227  CREATININE 0.92  TROPONINIHS 520*    Estimated Creatinine Clearance: 45 mL/min (by C-G formula based on SCr of 0.92 mg/dL).   Assessment: 79 yo female to start on heparin drip for ACS. No oral anticoagulant noted on PTA med list   Goal of Therapy:  Heparin level 0.3-0.7 units/ml Monitor platelets by anticoagulation protocol: Yes   Plan:  Baseline aPTT and INR ordered Heparin bolus 3900 units IV x1 then heparin drip 800 units/hr Check HL 8-hour after start of heparin drip CBC in AM  Brianna Adams L 02/29/2020,2:40 PM

## 2020-02-29 NOTE — H&P (Signed)
Name: Brianna Adams MRN: [id number] DOB: 1941/02/02    ADMISSION DATE:  02/29/2020 CONSULTATION DATE:  02/29/2020  REFERRING MD :  Dr. Charna Archer  CHIEF COMPLAINT:  Shortness of Breath  BRIEF PATIENT DESCRIPTION:  79 y.o. Female admitted with Acute on Chronic Hypoxic Respiratory Failure in setting of right segmental PE to Right lower lobe with right heart strain, Acute Decompensated CHF, and questionable pneumonia.  SIGNIFICANT EVENTS  6/25: Admission to Stepdown  STUDIES:  6/25: CXR>>Diffuse bronchial and interstitial prominence likely on the basis of chronic lung disease. There may be a component of interstitial pulmonary edema. 6/25: CTA Chest>>1. Study is positive for pulmonary embolism with a segmental sized embolus to the right lower lobe. There is also cardiomegaly with dilated right atrium and right ventricle with RV to LV ratio of 2.2 indicative of possible right heart strain. The presence of right heart strain has been associated with an increased risk of morbidity and mortality. Please refer to the "PE Focused" order set in EPIC. 2. New area of apparent airspace consolidation in the left lower lobe concerning for pneumonia or sequela of recent aspiration. 3. Worsening mediastinal and bilateral hilar lymphadenopathy, concerning for potential metastatic disease. 4. Evidence of interstitial pulmonary edema and trace bilateral pleural effusions; imaging findings which could suggest mild congestive heart failure. 5. Diffuse bronchial wall thickening with moderate centrilobular and mild paraseptal emphysema; imaging findings concerning for underlying COPD. 6. Evolving postradiation changes in the lingula. 7. Healing fracture of the lateral aspect of the left fifth rib. 8. Aortic atherosclerosis, in addition to left main and left anterior descending coronary artery disease. 6/25: Venous US Bilateral LE>> Negative 6/26: 2D Echocardiogram>>  CULTURES: SARS-CoV-2 PCR 6/25>>  negative MRSA PCR 6/25>> negative Strep pneumo urinary antigen 6/25>> negative Legionella urinary antigen 6/25>>  ANTIBIOTICS: Levofloxacin 6/25>>  HISTORY OF PRESENT ILLNESS:   Brianna Adams is a 79 year old female with a past medical history significant for COPD on 2 L home O2, pulmonary hypertension, lung cancer status post right lower lobe resection, type 2 diabetes mellitus who presented to [facility] ED on 02/29/2020 for a 2-week history of progressive shortness of breath.  Initial work-up in the ED revealed BNP 1357, high-sensitivity troponin 520, WBC 12.6 (with neutrophilia), and procalcitonin 1.11. Her COVID-19 PCR is negative.  Chest x-ray with possible interstitial pulmonary edema.  CTA chest revealed a segmental PE to the right lower lobe with evidence of right heart strain, interstitial pulmonary edema, underlying COPD, and new area of consolidation in the left lower lobe concerning for pneumonia. Venous US of the bilateral lower extremities is negative for DVT.  She was placed on heparin drip in the ED.  Case was discussed with vascular surgery who did not recommend any surgical intervention at this time.    PCCM is asked to admit the patient to stepdown for further work-up and treatment of acute hypoxic respiratory failure in the setting of segmental PE the right lower lobe, acute decompensated CHF, and questionable pneumonia. Cardiology has been consulted for assistance in managing CHF.  Of note the patient reports she was recently treated for pneumonia in [place] approximately 4 to 5 months ago.   PAST MEDICAL HISTORY :   has a past medical history of COPD (chronic obstructive pulmonary disease) ([place]), Diabetes ([place]), Diverticulitis, High cholesterol, Lung cancer ([place]), Pulmonary hypertension ([place]), and Uterine cancer ([place]).  has a past surgical history that includes cataract surgery (2012 and 2017); Abdominal hysterectomy; resection of lung cancer (2018); Fiberoptic bronchoscopy; right  thorascoscopy;  and right lower lobe non-anatomocal lung resection wedge. Prior to Admission medications   Medication Sig Start Date End Date Taking? Authorizing Provider  acetaminophen (TYLENOL) 325 MG tablet Take 325-650 mg by mouth every 6 (six) hours as needed for mild pain or fever.  01/30/17  Yes [provider]  albuterol (VENTOLIN HFA) 108 (90 Base) MCG/ACT inhaler Inhale 2 puffs into the lungs every 6 (six) hours as needed for wheezing or shortness of breath. 09/18/19  Yes Tyler Pita, MD  calcium carbonate (OSCAL) 1500 (600 Ca) MG TABS tablet Take 1,200 mg by mouth daily.   Yes [provider]  Cinnamon 500 MG TABS Take 2,000 mg by mouth daily.   Yes [provider]  Multiple Vitamins-Minerals (CENTRUM SILVER PO) Take 1 tablet by mouth daily.    Yes [provider]  omeprazole (PRILOSEC) 40 MG capsule Take 1 capsule (40 mg total) by mouth daily. 01/07/20  Yes [name], [name]  pravastatin (PRAVACHOL) 10 MG tablet Take 1 tablet (10 mg total) by mouth daily. 06/14/19  Yes [name], [name]  SitaGLIPtin-MetFORMIN HCl (JANUMET XR) 352 185 5015 MG TB24 Take 1 tablet by mouth 2 (two) times daily. 10/23/19  Yes [name], [name]  umeclidinium-vilanterol (ANORO ELLIPTA) 62.5-25 MCG/INH AEPB Inhale 1 puff into the lungs daily. 09/26/19  Yes Tyler Pita, MD  umeclidinium-vilanterol (ANORO ELLIPTA) 62.5-25 MCG/INH AEPB Inhale 1 puff into the lungs daily.    Yes [provider]  vitamin C (ASCORBIC ACID) 500 MG tablet Take 500 mg by mouth 4 (four) times a week.    Yes [provider]   Allergies  Allergen Reactions  . Penicillin G Shortness Of Breath    Other reaction(s): Rash  . Ace Inhibitors Swelling  . Atorvastatin     Other reaction(s): Myalgias (Muscle Pain) Other reaction(s): Myalgias (muscle pain)  . Cortisone     Other reaction(s): Angioedema  . Hydrocortisone Other (See Comments)  . Lisinopril     Other reaction(s):  Angioedema  . Meloxicam Other (See Comments)    Bloody stools  . Penicillins Hives  . Pravastatin     Other reaction(s): Chest Pain    FAMILY HISTORY:  family history includes Cancer in her maternal aunt; Heart attack in her father; Hypertension in her mother. SOCIAL HISTORY:  reports that she quit smoking about 4 years ago. Her smoking use included cigarettes. She has a 58.00 pack-year smoking history. She has never used smokeless tobacco. She reports that she does not drink alcohol and does not use drugs.   COVID-19 DISASTER DECLARATION:  FULL CONTACT PHYSICAL EXAMINATION WAS NOT POSSIBLE DUE TO TREATMENT OF COVID-19 AND  CONSERVATION OF PERSONAL PROTECTIVE EQUIPMENT, LIMITED EXAM FINDINGS INCLUDE-  Patient assessed or the symptoms described in the history of present illness.  In the context of the Global COVID-19 pandemic, which necessitated consideration that the patient might be at risk for infection with the SARS-CoV-2 virus that causes COVID-19, Institutional protocols and algorithms that pertain to the evaluation of patients at risk for COVID-19 are in a state of rapid change based on information released by regulatory bodies including the CDC and federal and state organizations. These policies and algorithms were followed during the patient's care while in hospital.  REVIEW OF SYSTEMS:  Positives in BOLD Constitutional: Negative for fever, chills, weight loss, malaise/fatigue and diaphoresis.  HENT: Negative for hearing loss, ear pain, nosebleeds, congestion, sore throat, neck pain, tinnitus and ear discharge.   Eyes: Negative for blurred vision, double  vision, photophobia, pain, discharge and redness.  Respiratory: Negative for cough, hemoptysis, sputum production, +shortness of breath, wheezing and stridor.   Cardiovascular: Negative for chest pain, palpitations, orthopnea, claudication, leg swelling and PND.  Gastrointestinal: Negative for heartburn, nausea, vomiting,  abdominal pain, diarrhea, constipation, blood in stool and melena.  Genitourinary: Negative for dysuria, urgency, frequency, hematuria and flank pain.  Musculoskeletal: Negative for myalgias, back pain, joint pain and falls.  Skin: Negative for itching and rash.  Neurological: Negative for dizziness, tingling, tremors, sensory change, speech change, focal weakness, seizures, loss of consciousness, weakness and headaches.  Endo/Heme/Allergies: Negative for environmental allergies and polydipsia. Does not bruise/bleed easily.  SUBJECTIVE:  Pt reports shortness of breath (much improved from earlier) and intermittent nonproductive cough Denies chest pain, N/V/D/ abdominal pain, fever/chills On 5L [place] (chronically on 2L [place])  VITAL SIGNS: Temp:  [97.9 F (36.6 C)-99.3 F (37.4 C)] 99.3 F (37.4 C) (06/25 1950) Pulse Rate:  [97-111] 101 (06/25 2100) Resp:  [25-39] 39 (06/25 2100) BP: (106-133)/(51-67) 106/59 (06/25 2100) SpO2:  [65 %-99 %] 91 % (06/25 2100) Weight:  [65.5 kg-71 kg] 65.5 kg (06/25 1850)  PHYSICAL EXAMINATION: General: Acutely ill-appearing female, laying in bed, on 5 L nasal cannula, no acute distress Neuro: Sleeping, arouses to voice, alert and oriented x4, follows commands, no focal deficits HEENT: Atraumatic, normocephalic, neck supple, no JVD Cardiovascular: Regular rate and rhythm, S1-S2, no murmurs, rubs, gallops, 2+ pulses Lungs: Clear diminished to auscultation bilaterally, even, nonlabored Abdomen: Soft, nontender, nondistended, no guarding or rebound tenderness, bowel sounds positive x4 Musculoskeletal: No deformities, no edema, normal bulk and tone Skin: Warm and dry.  No obvious rashes, lesions, ulcerations  Recent Labs  Lab 02/29/20 1304  NA 134*  K 3.9  CL 103  CO2 19*  BUN 13  CREATININE 0.92  GLUCOSE 190*   Recent Labs  Lab 02/29/20 1304  HGB 11.6*  HCT 34.3*  WBC 12.6*  PLT 227   CT Angio Chest PE W/Cm &/Or Wo Cm  Result Date:  02/29/2020 CLINICAL DATA:  79 year old female with history of shortness of breath for the past 2 weeks. EXAM: CT ANGIOGRAPHY CHEST WITH CONTRAST TECHNIQUE: Multidetector CT imaging of the chest was performed using the standard protocol during bolus administration of intravenous contrast. Multiplanar CT image reconstructions and MIPs were obtained to evaluate the vascular anatomy. CONTRAST:  57m OMNIPAQUE IOHEXOL 350 MG/ML SOLN COMPARISON:  Chest CT 09/27/2019. FINDINGS: Cardiovascular: Study is severely limited by large amount of patient respiratory motion. With this limitation in mind, there is at least one filling defect within a segmental sized pulmonary artery branch to the right lower lobe best appreciated on axial image 169 of series 5), compatible with pulmonary embolism. No central or lobar filling defect is noted. Heart size is enlarged with right ventricular and right atrial dilatation. Right ventricular dilatation (estimated right ventricular diameter of 5.7 cm versus 2.6 cm for the left ventricle). Right to left of the interatrial septum, indicative of elevated right heart pressures. There is no significant pericardial fluid, thickening or pericardial calcification. There is aortic atherosclerosis, as well as atherosclerosis of the great vessels of the mediastinum and the coronary arteries, including calcified atherosclerotic plaque in the left main and left anterior descending coronary arteries. Mediastinum/Nodes: Multiple borderline enlarged and mildly enlarged mediastinal and bilateral hilar lymph nodes are noted measuring up to 1.6 cm in short axis in the right paratracheal nodal station, markedly increased compared to the prior study. Esophagus is unremarkable in appearance.  No axillary lymphadenopathy. Lungs/Pleura: Widespread areas of ground-glass attenuation and interlobular septal thickening in the lungs bilaterally, suggesting a background of interstitial pulmonary edema, superimposed upon a  background of moderate centrilobular and mild paraseptal emphysema. Increasingly evident mass-like architectural distortion in the periphery of the lingula, likely to reflect evolving postradiation changes. New irregular areas of probable airspace consolidation in the left lower lobe, concerning for potential pneumonia or sequela of aspiration. Trace bilateral pleural effusions lying dependently. Upper Abdomen: Unremarkable. Musculoskeletal: Old healing fracture of the lateral aspect of the left fifth rib again noted. There are no aggressive appearing lytic or blastic lesions noted in the visualized portions of the skeleton. Review of the MIP images confirms the above findings. IMPRESSION: 1. Study is positive for pulmonary embolism with a segmental sized embolus to the right lower lobe. There is also cardiomegaly with dilated right atrium and right ventricle with RV to LV ratio of 2.2 indicative of possible right heart strain. The presence of right heart strain has been associated with an increased risk of morbidity and mortality. Please refer to the "PE Focused" order set in EPIC. 2. New area of apparent airspace consolidation in the left lower lobe concerning for pneumonia or sequela of recent aspiration. 3. Worsening mediastinal and bilateral hilar lymphadenopathy, concerning for potential metastatic disease. 4. Evidence of interstitial pulmonary edema and trace bilateral pleural effusions; imaging findings which could suggest mild congestive heart failure. 5. Diffuse bronchial wall thickening with moderate centrilobular and mild paraseptal emphysema; imaging findings concerning for underlying COPD. 6. Evolving postradiation changes in the lingula. 7. Healing fracture of the lateral aspect of the left fifth rib. 8. Aortic atherosclerosis, in addition to left main and left anterior descending coronary artery disease. Critical Value/emergent results were called by telephone at the time of interpretation on  02/29/2020 at 3:55 pm to provider [facility], who verbally acknowledged these results. Aortic Atherosclerosis (ICD10-I70.0) and Emphysema (ICD10-J43.9). Electronically Signed   By: Vinnie Langton M.D.   On: 02/29/2020 15:58   DG Chest Portable 1 View  Result Date: 02/29/2020 CLINICAL DATA:  Shortness of breath. History of lung carcinoma and chronic lung disease. EXAM: PORTABLE CHEST 1 VIEW COMPARISON:  CT of the chest on 09/27/2019 FINDINGS: The heart size is at the upper limits of normal. Diffuse bronchial and interstitial prominence present likely on the basis significant chronic lung disease. There may be a component of interstitial pulmonary edema. No significant pleural effusions. No pneumothorax. IMPRESSION: Diffuse bronchial and interstitial prominence likely on the basis of chronic lung disease. There may be a component of interstitial pulmonary edema. Electronically Signed   By: Aletta Edouard M.D.   On: 02/29/2020 13:19    ASSESSMENT / PLAN:  Acute on Chronic Hypoxic Respiratory Failure secondary to Right Segmental PE to Right lower lobe, Pulmonary Edema, & questionable LLL Pneumonia Hx: COPD on 2L supplemental O2, Lung cancer s/p RLL resection, Pulmonary HTN -Supplemental O2 as needed to maintain O2 saturations 88 to 92% -Follow intermittent CXR & ABG as needed -Heparin gtt  -Dr. Mortimer Fries discussed with Vascular surgery, who does not recommend any intervention at this time -Diuresis as BP and renal function permits -Place on Levaquin for now -Prn Bronchodilators  ? Pneumonia -Monitor fever curve -Trend WBC's & Procalcitonin -Follow cultures as above -Place on Levofloxacin for now (pt with PCN allergy)  Acute Decompensated CHF Elevated Troponin, likely demand ischemia -Continuous cardiac monitoring -Maintain MAP >65 -Cardiology following, appreciate input -Received 40 mg IV Lasix in ED x1 dose -  Further diuresis as BP and renal function permits -Trend troponin -2D  Echocardiogram pending  Diabetes Mellitus  -CBG's -SSI -Follow ICU Hypo/hyperglcyemia protocol          [facility] DISPOSITION: Stepdown GOALS OF CARE: Full code VTE PROPHYLAXIS/ANTICOAGULATION: Heparin gtt CONSULTS: Cardiology UPDATES: Updated pt at bedside   Darel Hong, [facility] Pager: [phone]  02/29/2020, 10:03 PM

## 2020-02-29 NOTE — ED Provider Notes (Signed)
[facility] Emergency Department Provider Note   ____________________________________________   First MD Initiated Contact with Patient 02/29/20 1224     (approximate)  I have reviewed the triage vital signs and the nursing notes.   HISTORY  Chief Complaint Shortness of Breath    HPI Brianna Adams is a 79 y.o. female with past medical history of COPD on 2L, pulmonary hypertension, diabetes, and lung cancer status post right right lower lobe resection presents to the ED complaining of shortness of breath.  Patient reports that she has had increasing difficulty breathing for the past 2 weeks, has also been much sleepier than usual, finding herself falling asleep during the day. She denies any associated fevers, cough, chest pain, or swelling in her legs. She has been using her breathing treatments at home without significant relief, states she typically wears her oxygen only at night but has been wearing it more consistently recently.        Past Medical History:  Diagnosis Date   COPD (chronic obstructive pulmonary disease) ([place])    Diabetes ([place])    Diverticulitis    High cholesterol    Lung cancer ([place])    Pulmonary hypertension ([place])    Uterine cancer [facility])     Patient Active Problem List   Diagnosis Date Noted   Chronic respiratory failure with hypoxia ([place]) 02/18/2020   Pulmonary nodule 02/18/2020   GERD (gastroesophageal reflux disease) 02/18/2020   Pulmonary hypertension, unspecified ([place]) 10/04/2019   Chronic obstructive pulmonary disease ([place]) 03/14/2019   Type 2 diabetes mellitus without complication, without long-term current use of insulin ([place]) 03/14/2019    Past Surgical History:  Procedure Laterality Date   ABDOMINAL HYSTERECTOMY     cataract surgery  2012 and 2017   FIBEROPTIC BRONCHOSCOPY     resection of lung cancer  2018   right lower lobe non-anatomocal lung resection wedge     right thorascoscopy       Prior to Admission medications   Medication Sig Start Date End Date Taking? Authorizing Provider  acetaminophen (TYLENOL) 325 MG tablet Take 325-650 mg by mouth every 6 (six) hours as needed for mild pain or fever.  01/30/17  Yes [provider]  albuterol (VENTOLIN HFA) 108 (90 Base) MCG/ACT inhaler Inhale 2 puffs into the lungs every 6 (six) hours as needed for wheezing or shortness of breath. 09/18/19  Yes Tyler Pita, MD  calcium carbonate (OSCAL) 1500 (600 Ca) MG TABS tablet Take 1,200 mg by mouth daily.   Yes [provider]  Cinnamon 500 MG TABS Take 2,000 mg by mouth daily.   Yes [provider]  Multiple Vitamins-Minerals (CENTRUM SILVER PO) Take 1 tablet by mouth daily.    Yes [provider]  omeprazole (PRILOSEC) 40 MG capsule Take 1 capsule (40 mg total) by mouth daily. 01/07/20  Yes [name], [name]  pravastatin (PRAVACHOL) 10 MG tablet Take 1 tablet (10 mg total) by mouth daily. 06/14/19  Yes [name], [name]  SitaGLIPtin-MetFORMIN HCl (JANUMET XR) [phone] MG TB24 Take 1 tablet by mouth 2 (two) times daily. 10/23/19  Yes [name], [name]  umeclidinium-vilanterol (ANORO ELLIPTA) 62.5-25 MCG/INH AEPB Inhale 1 puff into the lungs daily. 09/26/19  Yes Tyler Pita, MD  umeclidinium-vilanterol (ANORO ELLIPTA) 62.5-25 MCG/INH AEPB Inhale 1 puff into the lungs daily.    Yes [provider]  vitamin C (ASCORBIC ACID) 500 MG tablet Take 500 mg by mouth 4 (four) times a week.  Yes [provider]    Allergies Penicillin g, Ace inhibitors, Atorvastatin, Cortisone, Hydrocortisone, Lisinopril, Meloxicam, Penicillins, and Pravastatin  Family History  Problem Relation Age of Onset   Hypertension Mother    Heart attack Father    Cancer Maternal Aunt     Social History Social History   Tobacco Use   Smoking status: Former Smoker    Packs/day: 1.00    Years: 58.00    Pack years: 58.00    Types: Cigarettes     Quit date: 10/22/2015    Years since quitting: 4.3   Smokeless tobacco: Never Used  Vaping Use   Vaping Use: Never used  Substance Use Topics   Alcohol use: Never   Drug use: Never    Review of Systems  Constitutional: No fever/chills Eyes: No visual changes. ENT: No sore throat. Cardiovascular: Denies chest pain. Respiratory: Positive for shortness of breath. Gastrointestinal: No abdominal pain.  No nausea, no vomiting.  No diarrhea.  No constipation. Genitourinary: Negative for dysuria. Musculoskeletal: Negative for back pain. Skin: Negative for rash. Neurological: Negative for headaches, focal weakness or numbness.  ____________________________________________   PHYSICAL EXAM:  VITAL SIGNS: ED Triage Vitals  Enc Vitals Group     BP --      Pulse Rate 02/29/20 1218 (!) 102     Resp 02/29/20 1218 (!) 26     Temp 02/29/20 1218 97.9 F (36.6 C)     Temp Source 02/29/20 1218 Oral     SpO2 02/29/20 1218 (!) 65 %     Weight 02/29/20 1219 156 lb 9.6 oz (71 kg)     Height 02/29/20 1219 _0  (1.575 m)     Head Circumference --      Peak Flow --      Pain Score 02/29/20 1219 0     Pain Loc --      Pain Edu? --      Excl. in [place]? --     Constitutional: Alert and oriented. Eyes: Conjunctivae are normal. Head: Atraumatic. Nose: No congestion/rhinnorhea. Mouth/Throat: Mucous membranes are moist. Neck: Normal ROM Cardiovascular: Normal rate, regular rhythm. Grossly normal heart sounds. Respiratory: Tachypneic with increased respiratory effort.  No retractions. Lungs with crackles to bilateral bases and mild expiratory wheezing. Gastrointestinal: Soft and nontender. No distention. Genitourinary: deferred Musculoskeletal: No lower extremity tenderness nor edema. Neurologic:  Normal speech and language. No gross focal neurologic deficits are appreciated. Skin:  Skin is warm, dry and intact. No rash noted. Psychiatric: Mood and affect are normal. Speech and behavior are  normal.  ____________________________________________   LABS (all labs ordered are listed, but only abnormal results are displayed)  Labs Reviewed  BASIC METABOLIC PANEL - Abnormal; Notable for the following components:      Result Value   Sodium 134 (*)    CO2 19 (*)    Glucose, Bld 190 (*)    Calcium 8.8 (*)    GFR calc non Af Amer 59 (*)    All other components within normal limits  BRAIN NATRIURETIC PEPTIDE - Abnormal; Notable for the following components:   B Natriuretic Peptide 1,357.6 (*)    All other components within normal limits  CBC WITH DIFFERENTIAL/PLATELET - Abnormal; Notable for the following components:   WBC 12.6 (*)    Hemoglobin 11.6 (*)    HCT 34.3 (*)    RDW 17.0 (*)    Neutro Abs 10.8 (*)    All other components within normal limits  BLOOD GAS,  VENOUS - Abnormal; Notable for the following components:   pH, Ven 7.44 (*)    pCO2, Ven 28 (*)    pO2, Ven 181.0 (*)    Bicarbonate 19.0 (*)    Acid-base deficit 4.0 (*)    All other components within normal limits  TROPONIN I (HIGH SENSITIVITY) - Abnormal; Notable for the following components:   Troponin I (High Sensitivity) 520 (*)    All other components within normal limits  TROPONIN I (HIGH SENSITIVITY) - Abnormal; Notable for the following components:   Troponin I (High Sensitivity) 497 (*)    All other components within normal limits  SARS CORONAVIRUS 2 BY RT PCR (HOSPITAL ORDER, [facility])  PROTIME-INR  APTT  HEPARIN LEVEL (UNFRACTIONATED)   ____________________________________________  EKG  ED ECG REPORT I, Blake Divine, the attending physician, personally viewed and interpreted this ECG.   Date: 02/29/2020  EKG Time: 12:22  Rate: 103  Rhythm: sinus tachycardia  Axis: Normal  Intervals:none  ST&T Change: Diffuse ST depressions, most prominent laterally   PROCEDURES  Procedure(s) performed (including Critical Care):  .Critical Care Performed by:  Blake Divine, MD Authorized by: Blake Divine, MD   Critical care provider statement:    Critical care time (minutes):  45   Critical care time was exclusive of:  Separately billable procedures and treating other patients and teaching time   Critical care was necessary to treat or prevent imminent or life-threatening deterioration of the following conditions:  Respiratory failure   Critical care was time spent personally by me on the following activities:  Discussions with consultants, evaluation of patient's response to treatment, examination of patient, ordering and performing treatments and interventions, ordering and review of laboratory studies, ordering and review of radiographic studies, pulse oximetry, re-evaluation of patient's condition, obtaining history from patient or surrogate and review of old charts   I assumed direction of critical care for this patient from another provider in my specialty: no   .1-3 Lead EKG Interpretation Performed by: Blake Divine, MD Authorized by: Blake Divine, MD     Interpretation: abnormal     ECG rate:  105   ECG rate assessment: tachycardic     Rhythm: sinus tachycardia     Ectopy: none     Conduction: normal       ____________________________________________   INITIAL IMPRESSION / ASSESSMENT AND PLAN / ED COURSE       79 year old female with history of COPD on 2 L nasal cannula, lung cancer status post right lower lobe resection, pulmonary hypertension, and diabetes who presents to the ED complaining of increasing difficulty breathing over the past couple of weeks. She arrives tachycardic and tachypneic, with O2 sats noted to be 65% on her usual 2 L nasal cannula. She has some mild wheezing but not likely enough to be the cause of her profound hypoxia and tachypnea. There are crackles suggesting possible new onset CHF, but with her history of lung cancer we will further assess with CTA for PE. For now we will treat with breathing  treatments and reassess potential need for BiPAP. She is maintaining O2 sats at 90% on 6 L. EKG shows diffuse ST depressions suggesting diffuse subendocardial ischemia but does not meet STEMI criteria. Lower suspicion for ACS as patient denies any chest pain, troponin pending.  Troponin noted to be elevated to over 500 and case was discussed with Dr. Ubaldo Glassing of cardiology.  While ACS seems less likely than CHF as the cause of  her elevated troponin, we will start her on heparin drip for now and trend troponin levels.  Her breathing seems to be gradually improving following dose of IV Lasix.  CTA is positive for segmental PE with evidence for right heart strain given RV to LV ratio of 2.2.  Patient currently on heparin drip and case was discussed with vascular surgery, who will review imaging and determine if she is appropriate for any intervention.  Case discussed with Dr. Stoney Bang in ICU, who accepts patient for admission.  She remains tachypneic but maintaining O2 sats on 6 L nasal cannula and is hemodynamically stable.      ____________________________________________   FINAL CLINICAL IMPRESSION(S) / ED DIAGNOSES  Final diagnoses:  Acute pulmonary embolism with acute cor pulmonale, unspecified pulmonary embolism type (HCC)  Acute congestive heart failure, unspecified heart failure type [facility])     ED Discharge Orders    None       Note:  This document was prepared using Dragon voice recognition software and may include unintentional dictation errors.   Blake Divine, MD 02/29/20 [phone]

## 2020-03-01 ENCOUNTER — Inpatient Hospital Stay
Admit: 2020-03-01 | Discharge: 2020-03-01 | Disposition: A | Discharge status: Home / Self Care | Payer: Medicare Other | Attending: Rehabilitative and Restorative Service Providers" | Admitting: Rehabilitative and Restorative Service Providers"

## 2020-03-01 LAB — BASIC METABOLIC PANEL
Anion gap: 10 (ref 5–15)
BUN: 15 mg/dL (ref 8–23)
CO2: 23 mmol/L (ref 22–32)
Calcium: 8.5 mg/dL — ABNORMAL LOW (ref 8.9–10.3)
Chloride: 103 mmol/L (ref 98–111)
Creatinine, Ser: 0.95 mg/dL (ref 0.44–1.00)
GFR calc Af Amer: >60 mL/min (ref >60)
GFR calc non Af Amer: 57 mL/min — ABNORMAL LOW (ref >60)
Glucose, Bld: 147 mg/dL — ABNORMAL HIGH (ref 70–99)
Potassium: 3.6 mmol/L (ref 3.5–5.1)
Sodium: 136 mmol/L (ref 135–145)

## 2020-03-01 LAB — ECHOCARDIOGRAM COMPLETE
Height: 62 in
Weight: 2469.15 oz

## 2020-03-01 LAB — CBC
HCT: 34.4 % — ABNORMAL LOW (ref 36.0–46.0)
Hemoglobin: 11 g/dL — ABNORMAL LOW (ref 12.0–15.0)
MCH: 27.3 pg (ref 26.0–34.0)
MCHC: 32 g/dL (ref 30.0–36.0)
MCV: 85.4 fL (ref 80.0–100.0)
Platelets: 233 10*3/uL (ref 150–400)
RBC: 4.03 MIL/uL (ref 3.87–5.11)
RDW: 16.5 % — ABNORMAL HIGH (ref 11.5–15.5)
WBC: 10.1 10*3/uL (ref 4.0–10.5)
nRBC: 0 % (ref 0.0–0.2)

## 2020-03-01 LAB — HEPARIN LEVEL (UNFRACTIONATED)
Heparin Unfractionated: 0.33 IU/mL (ref 0.30–0.70)
Heparin Unfractionated: 0.43 IU/mL (ref 0.30–0.70)
Heparin Unfractionated: <0.1 IU/mL — ABNORMAL LOW (ref 0.30–0.70)
Heparin Unfractionated: <0.1 IU/mL — ABNORMAL LOW (ref 0.30–0.70)

## 2020-03-01 LAB — PROCALCITONIN: Procalcitonin: 1.05 ng/mL

## 2020-03-01 LAB — GLUCOSE, CAPILLARY
Glucose-Capillary: 125 mg/dL — ABNORMAL HIGH (ref 70–99)
Glucose-Capillary: 155 mg/dL — ABNORMAL HIGH (ref 70–99)
Glucose-Capillary: 122 mg/dL — ABNORMAL HIGH (ref 70–99)
Glucose-Capillary: 95 mg/dL (ref 70–99)

## 2020-03-01 LAB — STREP PNEUMONIAE URINARY ANTIGEN: Strep Pneumo Urinary Antigen: NEGATIVE

## 2020-03-01 LAB — BRAIN NATRIURETIC PEPTIDE: B Natriuretic Peptide: 1093.5 pg/mL — ABNORMAL HIGH (ref 0.0–100.0)

## 2020-03-01 MED ORDER — LEVOFLOXACIN IN D5W 750 MG/150ML IV SOLN
750.0000 mg | INTRAVENOUS | Status: DC
  Administered 2020-03-02: 22:00:00 750 mg via INTRAVENOUS
  Filled 2020-03-01: qty 150

## 2020-03-01 MED ORDER — HEPARIN BOLUS VIA INFUSION
2000.0000 [IU] | Freq: Once | INTRAVENOUS | Status: AC
  Administered 2020-03-01: 2000 [IU] via INTRAVENOUS
  Filled 2020-03-01: qty 2000

## 2020-03-01 MED ORDER — ENSURE ENLIVE PO LIQD
237.0000 mL | ORAL | Status: DC
  Administered 2020-03-01: 237 mL via ORAL

## 2020-03-01 MED ORDER — HEPARIN (PORCINE) 25000 UT/250ML-% IV SOLN
1300.0000 [IU]/h | INTRAVENOUS | Status: DC
  Administered 2020-03-01: 1100 [IU]/h via INTRAVENOUS
  Filled 2020-03-01: qty 250

## 2020-03-01 NOTE — Progress Notes (Addendum)
Patient Name: Brianna Adams Date of Encounter: 03/01/2020  Hospital Problem List     Active Problems:   Pulmonary embolism [facility])    Patient Profile      79 year old female with a past medical history significant for oxygen dependent COPD, pulmonary hypertension, lung cancer s/p right lower lobe resection, and type 2 diabetes who presented to the ED on 02/29/20 for a 2 week history of progressive shortness of breath.  Workup in the ED has been significant for chest xray revealing possible interstitial pulmonary edema, O2 sats on 2L of O2 reported in the 60s, BNP of 1357.6, high sensitivity troponin elevated x 2 at 520 and 497 respectively, and ECG revealing sinus tachycardia with diffuse ST depressions.   Subjective   Feels good today without complaints.  No chest pain or shortness of breath.  Inpatient Medications    . Chlorhexidine Gluconate Cloth  6 each Topical Daily  . insulin aspart  0-15 Units Subcutaneous TID WC  . insulin aspart  0-5 Units Subcutaneous QHS  . sodium chloride flush  3 mL Intravenous Q12H    Vital Signs    Vitals:   03/01/20 0517 03/01/20 0600 03/01/20 0740 03/01/20 1000  BP:    (!) 126/59  Pulse: 85 83  88  Resp: (!) 26 20  (!) 24  Temp: 98 F (36.7 C)  98 F (36.7 C)   TempSrc: Oral  Oral   SpO2: 95% 90%  90%  Weight:      Height:        Intake/Output Summary (Last 24 hours) at 03/01/2020 1123 Last data filed at 03/01/2020 0800 Gross per 24 hour  Intake 428.51 ml  Output 1300 ml  Net -871.49 ml   Filed Weights   02/29/20 1219 02/29/20 1850 03/01/20 0300  Weight: 71 kg 65.5 kg 70 kg    Physical Exam    GEN: Well nourished, well developed, in no acute distress.  HEENT: normal.  Neck: Supple, no JVD, carotid bruits, or masses. Cardiac: RRR, no murmurs, rubs, or gallops. No clubbing, cyanosis, edema.  Radials/DP/PT 2+ and equal bilaterally.  Respiratory:  Respirations regular and unlabored, clear to auscultation bilaterally. GI: Soft,  nontender, nondistended, BS + x 4. MS: no deformity or atrophy. Skin: warm and dry, no rash. Neuro:  Strength and sensation are intact. Psych: Normal affect.  Labs    CBC Recent Labs    02/29/20 1304 03/01/20 1004  WBC 12.6* 10.1  NEUTROABS 10.8*  --   HGB 11.6* 11.0*  HCT 34.3* 34.4*  MCV 82.7 85.4  PLT 227 161   Basic Metabolic Panel Recent Labs    02/29/20 1304 03/01/20 0458  NA 134* 136  K 3.9 3.6  CL 103 103  CO2 19* 23  GLUCOSE 190* 147*  BUN 13 15  CREATININE 0.92 0.95  CALCIUM 8.8* 8.5*   Liver Function Tests No results for input(s): AST, ALT, ALKPHOS, BILITOT, PROT, ALBUMIN in the last 72 hours. No results for input(s): LIPASE, AMYLASE in the last 72 hours. Cardiac Enzymes No results for input(s): CKTOTAL, CKMB, CKMBINDEX, TROPONINI in the last 72 hours. BNP Recent Labs    02/29/20 1304 03/01/20 0458  BNP 1,357.6* 1,093.5*   D-Dimer No results for input(s): DDIMER in the last 72 hours. Hemoglobin A1C No results for input(s): HGBA1C in the last 72 hours. Fasting Lipid Panel No results for input(s): CHOL, HDL, LDLCALC, TRIG, CHOLHDL, LDLDIRECT in the last 72 hours. Thyroid Function Tests No results  for input(s): TSH, T4TOTAL, T3FREE, THYROIDAB in the last 72 hours.  Invalid input(s): FREET3  Telemetry    Sinus rhythm  ECG    Sinus tachycardia with no ischemia.  Radiology    CT Angio Chest PE W/Cm &/Or Wo Cm  Result Date: 02/29/2020 CLINICAL DATA:  79 year old female with history of shortness of breath for the past 2 weeks. EXAM: CT ANGIOGRAPHY CHEST WITH CONTRAST TECHNIQUE: Multidetector CT imaging of the chest was performed using the standard protocol during bolus administration of intravenous contrast. Multiplanar CT image reconstructions and MIPs were obtained to evaluate the vascular anatomy. CONTRAST:  60m OMNIPAQUE IOHEXOL 350 MG/ML SOLN COMPARISON:  Chest CT 09/27/2019. FINDINGS: Cardiovascular: Study is severely limited by large  amount of patient respiratory motion. With this limitation in mind, there is at least one filling defect within a segmental sized pulmonary artery branch to the right lower lobe best appreciated on axial image 169 of series 5), compatible with pulmonary embolism. No central or lobar filling defect is noted. Heart size is enlarged with right ventricular and right atrial dilatation. Right ventricular dilatation (estimated right ventricular diameter of 5.7 cm versus 2.6 cm for the left ventricle). Right to left of the interatrial septum, indicative of elevated right heart pressures. There is no significant pericardial fluid, thickening or pericardial calcification. There is aortic atherosclerosis, as well as atherosclerosis of the great vessels of the mediastinum and the coronary arteries, including calcified atherosclerotic plaque in the left main and left anterior descending coronary arteries. Mediastinum/Nodes: Multiple borderline enlarged and mildly enlarged mediastinal and bilateral hilar lymph nodes are noted measuring up to 1.6 cm in short axis in the right paratracheal nodal station, markedly increased compared to the prior study. Esophagus is unremarkable in appearance. No axillary lymphadenopathy. Lungs/Pleura: Widespread areas of ground-glass attenuation and interlobular septal thickening in the lungs bilaterally, suggesting a background of interstitial pulmonary edema, superimposed upon a background of moderate centrilobular and mild paraseptal emphysema. Increasingly evident mass-like architectural distortion in the periphery of the lingula, likely to reflect evolving postradiation changes. New irregular areas of probable airspace consolidation in the left lower lobe, concerning for potential pneumonia or sequela of aspiration. Trace bilateral pleural effusions lying dependently. Upper Abdomen: Unremarkable. Musculoskeletal: Old healing fracture of the lateral aspect of the left fifth rib again noted.  There are no aggressive appearing lytic or blastic lesions noted in the visualized portions of the skeleton. Review of the MIP images confirms the above findings. IMPRESSION: 1. Study is positive for pulmonary embolism with a segmental sized embolus to the right lower lobe. There is also cardiomegaly with dilated right atrium and right ventricle with RV to LV ratio of 2.2 indicative of possible right heart strain. The presence of right heart strain has been associated with an increased risk of morbidity and mortality. Please refer to the "PE Focused" order set in EPIC. 2. New area of apparent airspace consolidation in the left lower lobe concerning for pneumonia or sequela of recent aspiration. 3. Worsening mediastinal and bilateral hilar lymphadenopathy, concerning for potential metastatic disease. 4. Evidence of interstitial pulmonary edema and trace bilateral pleural effusions; imaging findings which could suggest mild congestive heart failure. 5. Diffuse bronchial wall thickening with moderate centrilobular and mild paraseptal emphysema; imaging findings concerning for underlying COPD. 6. Evolving postradiation changes in the lingula. 7. Healing fracture of the lateral aspect of the left fifth rib. 8. Aortic atherosclerosis, in addition to left main and left anterior descending coronary artery disease. Critical Value/emergent results were  called by telephone at the time of interpretation on 02/29/2020 at 3:55 pm to provider [facility], who verbally acknowledged these results. Aortic Atherosclerosis (ICD10-I70.0) and Emphysema (ICD10-J43.9). Electronically Signed   By: Vinnie Langton M.D.   On: 02/29/2020 15:58   US Venous Img Lower Bilateral (DVT)  Result Date: 02/29/2020 CLINICAL DATA:  79 year old female with pulmonary embolism. EXAM: Bilateral LOWER EXTREMITY VENOUS DOPPLER ULTRASOUND TECHNIQUE: Gray-scale sonography with compression, as well as color and duplex ultrasound, were performed to evaluate  the deep venous system(s) from the level of the common femoral vein through the popliteal and proximal calf veins. COMPARISON:  None. FINDINGS: VENOUS Normal compressibility of the common femoral, superficial femoral, and popliteal veins, as well as the visualized calf veins. Visualized portions of profunda femoral vein and great saphenous vein unremarkable. No filling defects to suggest DVT on grayscale or color Doppler imaging. Doppler waveforms show normal direction of venous flow, normal respiratory plasticity and response to augmentation. Limited views of the contralateral common femoral vein are unremarkable. OTHER None. Limitations: none IMPRESSION: Negative. Electronically Signed   By: Anner Crete M.D.   On: 02/29/2020 22:33   DG Chest Portable 1 View  Result Date: 02/29/2020 CLINICAL DATA:  Shortness of breath. History of lung carcinoma and chronic lung disease. EXAM: PORTABLE CHEST 1 VIEW COMPARISON:  CT of the chest on 09/27/2019 FINDINGS: The heart size is at the upper limits of normal. Diffuse bronchial and interstitial prominence present likely on the basis significant chronic lung disease. There may be a component of interstitial pulmonary edema. No significant pleural effusions. No pneumothorax. IMPRESSION: Diffuse bronchial and interstitial prominence likely on the basis of chronic lung disease. There may be a component of interstitial pulmonary edema. Electronically Signed   By: Aletta Edouard M.D.   On: 02/29/2020 13:19    Assessment & Plan    1.  Acute CHF              -BNP elevated, probable pulmonary edema on chest xray              -Echo revealed good left heart funciton. Evidence of rv hypokinesis globally with elevated pulmiary pressures estimated at 60 mmHg.   2.  Elevated troponin              -Likely demand ischemia in the setting of acute respiratory failure, PE             -Patient denies chest pain, reports progressive symptoms over the past 2 weeks; low suspicion  for ACS             -Echocardiogram as per above.   3.  Pulmonary Embolism              -Chest CT positive for PE with evidence of right heart strain . Continue with heparin. Hemodynamically stable at present.   Signed, Javier Docker Mandisa Persinger MD 03/01/2020, 11:23 AM  Pager: [phone]

## 2020-03-01 NOTE — Progress Notes (Signed)
Pharmacy Antibiotic Note  Brianna Adams is a 79 y.o. female admitted on 02/29/2020 with pneumonia.  Pharmacy has been consulted for Levaquin dosing.  Plan: Levaquin 729m IV q48hrs  Height: 5' 2" (157.5 cm) Weight: 65.5 kg (144 lb 6.4 oz) IBW/kg (Calculated) : 50.1  Temp (24hrs), Avg:99.3 F (37.4 C), Min:97.9 F (36.6 C), Max:100.6 F (38.1 C)  Recent Labs  Lab 02/29/20 1304  WBC 12.6*  CREATININE 0.92    Estimated Creatinine Clearance: 43.3 mL/min (by C-G formula based on SCr of 0.92 mg/dL).    Allergies  Allergen Reactions  . Penicillin G Shortness Of Breath    Other reaction(s): Rash  . Ace Inhibitors Swelling  . Atorvastatin     Other reaction(s): Myalgias (Muscle Pain) Other reaction(s): Myalgias (muscle pain)  . Cortisone     Other reaction(s): Angioedema  . Hydrocortisone Other (See Comments)  . Lisinopril     Other reaction(s): Angioedema  . Meloxicam Other (See Comments)    Bloody stools  . Penicillins Hives  . Pravastatin     Other reaction(s): Chest Pain    Antimicrobials this admission:   >>    >>   Dose adjustments this admission:   Microbiology results:  BCx:   UCx:    Sputum:    MRSA PCR:   Thank you for allowing pharmacy to be a part of this patient's care.  Brianna Adams 03/01/2020 2:22 AM

## 2020-03-01 NOTE — Progress Notes (Signed)
CHIEF COMPLAINT:   Chief Complaint  Patient presents with  . Shortness of Breath    Subjective  Follow up PE No distress Minimal oxygen BP stable  CBC    Component Value Date/Time   WBC 12.6 (H) 02/29/2020 1304   RBC 4.15 02/29/2020 1304   HGB 11.6 (L) 02/29/2020 1304   HCT 34.3 (L) 02/29/2020 1304   PLT 227 02/29/2020 1304   MCV 82.7 02/29/2020 1304   MCH 28.0 02/29/2020 1304   MCHC 33.8 02/29/2020 1304   RDW 17.0 (H) 02/29/2020 1304   LYMPHSABS 0.9 02/29/2020 1304   MONOABS 0.8 02/29/2020 1304   EOSABS 0.0 02/29/2020 1304   BASOSABS 0.1 02/29/2020 1304   BMP Latest Ref Rng & Units 03/01/2020 02/29/2020 01/07/2020  Glucose 70 - 99 mg/dL 147(H) 190(H) 57(L)  BUN 8 - 23 mg/dL _0 Creatinine 0.44 - 1.00 mg/dL 0.95 0.92 0.94  BUN/Creat Ratio 12 - 28 - - 13  Sodium 135 - 145 mmol/L 136 134(L) 142  Potassium 3.5 - 5.1 mmol/L 3.6 3.9 4.9  Chloride 98 - 111 mmol/L 103 103 105  CO2 22 - 32 mmol/L 23 19(L) 21  Calcium 8.9 - 10.3 mg/dL 8.5(L) 8.8(L) 9.9     Review of Systems:  Gen:  Denies  fever, sweats, chills weight loss  HEENT: Denies blurred vision, double vision, ear pain, eye pain, hearing loss, nose bleeds, sore throat Cardiac:  No dizziness, chest pain or heaviness, chest tightness,edema,  Resp:   No cough, -sputum production, +shortness of breath,-wheezing, -hemoptysis,  Other:  All other systems negative  Objective   Examination:  General exam: Appears calm and comfortable  Respiratory system: Clear to auscultation. Respiratory effort normal. HEENT: [place]/AT, PERRLA, no thrush, no stridor. Cardiovascular system: S1 & S2 heard, RRR. No JVD, murmurs, rubs, gallops or clicks. No pedal edema. Gastrointestinal system: Abdomen is nondistended, soft and nontender. No organomegaly or masses felt. Normal bowel sounds heard. Central nervous system: Alert and oriented. No focal neurological deficits. Extremities: Symmetric 5 x 5 power. Skin: No rashes, lesions or  ulcers Psychiatry: Judgement and insight appear normal. Mood & affect appropriate.   VITALS:  height is _1  (1.575 m) and weight is 70 kg. Her oral temperature is 98 F (36.7 C). Her blood pressure is 94/56 (abnormal) and her pulse is 83. Her respiration is 20 and oxygen saturation is 90%.   I personally reviewed Labs under Results section.  Radiology Reports CT Angio Chest PE W/Cm &/Or Wo Cm  Result Date: 02/29/2020 CLINICAL DATA:  79 year old female with history of shortness of breath for the past 2 weeks. EXAM: CT ANGIOGRAPHY CHEST WITH CONTRAST TECHNIQUE: Multidetector CT imaging of the chest was performed using the standard protocol during bolus administration of intravenous contrast. Multiplanar CT image reconstructions and MIPs were obtained to evaluate the vascular anatomy. CONTRAST:  70m OMNIPAQUE IOHEXOL 350 MG/ML SOLN COMPARISON:  Chest CT 09/27/2019. FINDINGS: Cardiovascular: Study is severely limited by large amount of patient respiratory motion. With this limitation in mind, there is at least one filling defect within a segmental sized pulmonary artery branch to the right lower lobe best appreciated on axial image 169 of series 5), compatible with pulmonary embolism. No central or lobar filling defect is noted. Heart size is enlarged with right ventricular and right atrial dilatation. Right ventricular dilatation (estimated right ventricular diameter of 5.7 cm versus 2.6 cm for the left ventricle). Right to left of the interatrial septum, indicative of elevated  right heart pressures. There is no significant pericardial fluid, thickening or pericardial calcification. There is aortic atherosclerosis, as well as atherosclerosis of the great vessels of the mediastinum and the coronary arteries, including calcified atherosclerotic plaque in the left main and left anterior descending coronary arteries. Mediastinum/Nodes: Multiple borderline enlarged and mildly enlarged mediastinal and  bilateral hilar lymph nodes are noted measuring up to 1.6 cm in short axis in the right paratracheal nodal station, markedly increased compared to the prior study. Esophagus is unremarkable in appearance. No axillary lymphadenopathy. Lungs/Pleura: Widespread areas of ground-glass attenuation and interlobular septal thickening in the lungs bilaterally, suggesting a background of interstitial pulmonary edema, superimposed upon a background of moderate centrilobular and mild paraseptal emphysema. Increasingly evident mass-like architectural distortion in the periphery of the lingula, likely to reflect evolving postradiation changes. New irregular areas of probable airspace consolidation in the left lower lobe, concerning for potential pneumonia or sequela of aspiration. Trace bilateral pleural effusions lying dependently. Upper Abdomen: Unremarkable. Musculoskeletal: Old healing fracture of the lateral aspect of the left fifth rib again noted. There are no aggressive appearing lytic or blastic lesions noted in the visualized portions of the skeleton. Review of the MIP images confirms the above findings. IMPRESSION: 1. Study is positive for pulmonary embolism with a segmental sized embolus to the right lower lobe. There is also cardiomegaly with dilated right atrium and right ventricle with RV to LV ratio of 2.2 indicative of possible right heart strain. The presence of right heart strain has been associated with an increased risk of morbidity and mortality. Please refer to the "PE Focused" order set in EPIC. 2. New area of apparent airspace consolidation in the left lower lobe concerning for pneumonia or sequela of recent aspiration. 3. Worsening mediastinal and bilateral hilar lymphadenopathy, concerning for potential metastatic disease. 4. Evidence of interstitial pulmonary edema and trace bilateral pleural effusions; imaging findings which could suggest mild congestive heart failure. 5. Diffuse bronchial wall  thickening with moderate centrilobular and mild paraseptal emphysema; imaging findings concerning for underlying COPD. 6. Evolving postradiation changes in the lingula. 7. Healing fracture of the lateral aspect of the left fifth rib. 8. Aortic atherosclerosis, in addition to left main and left anterior descending coronary artery disease. Critical Value/emergent results were called by telephone at the time of interpretation on 02/29/2020 at 3:55 pm to provider [facility], who verbally acknowledged these results. Aortic Atherosclerosis (ICD10-I70.0) and Emphysema (ICD10-J43.9). Electronically Signed   By: Vinnie Langton M.D.   On: 02/29/2020 15:58   US Venous Img Lower Bilateral (DVT)  Result Date: 02/29/2020 CLINICAL DATA:  79 year old female with pulmonary embolism. EXAM: Bilateral LOWER EXTREMITY VENOUS DOPPLER ULTRASOUND TECHNIQUE: Gray-scale sonography with compression, as well as color and duplex ultrasound, were performed to evaluate the deep venous system(s) from the level of the common femoral vein through the popliteal and proximal calf veins. COMPARISON:  None. FINDINGS: VENOUS Normal compressibility of the common femoral, superficial femoral, and popliteal veins, as well as the visualized calf veins. Visualized portions of profunda femoral vein and great saphenous vein unremarkable. No filling defects to suggest DVT on grayscale or color Doppler imaging. Doppler waveforms show normal direction of venous flow, normal respiratory plasticity and response to augmentation. Limited views of the contralateral common femoral vein are unremarkable. OTHER None. Limitations: none IMPRESSION: Negative. Electronically Signed   By: Anner Crete M.D.   On: 02/29/2020 22:33   DG Chest Portable 1 View  Result Date: 02/29/2020 CLINICAL DATA:  Shortness of breath.  History of lung carcinoma and chronic lung disease. EXAM: PORTABLE CHEST 1 VIEW COMPARISON:  CT of the chest on 09/27/2019 FINDINGS: The heart  size is at the upper limits of normal. Diffuse bronchial and interstitial prominence present likely on the basis significant chronic lung disease. There may be a component of interstitial pulmonary edema. No significant pleural effusions. No pneumothorax. IMPRESSION: Diffuse bronchial and interstitial prominence likely on the basis of chronic lung disease. There may be a component of interstitial pulmonary edema. Electronically Signed   By: Aletta Edouard M.D.   On: 02/29/2020 13:19       Assessment/Plan:  ACUTE PE Continue oxygen as needed Continue AC with heparin  COPD oxygen as needed BD therapy   ELECTROLYTES -follow labs as needed -replace as needed -pharmacy consultation and following    DVT/GI PRX ordered and assessed TRANSFUSIONS AS NEEDED MONITOR FSBS I Assessed the need for Labs I Assessed the need for Foley I Assessed the need for Central Venous Line Family Discussion when available I Assessed the need for Mobilization I made an Assessment of medications to be adjusted accordingly Safety Risk assessment completed   transfer to [facility] 6/27   Aleynah Rocchio Patricia Pesa, M.D.  [facility]  Medical Director Golf Director [facility] Cardio-Pulmonary Department

## 2020-03-01 NOTE — Progress Notes (Signed)
ANTICOAGULATION CONSULT NOTE -   Pharmacy Consult for Heparin Indication: chest pain/ACS  Allergies  Allergen Reactions  . Penicillin G Shortness Of Breath    Other reaction(s): Rash  . Ace Inhibitors Swelling  . Atorvastatin     Other reaction(s): Myalgias (Muscle Pain) Other reaction(s): Myalgias (muscle pain)  . Cortisone     Other reaction(s): Angioedema  . Hydrocortisone Other (See Comments)  . Lisinopril     Other reaction(s): Angioedema  . Meloxicam Other (See Comments)    Bloody stools  . Penicillins Hives  . Pravastatin     Other reaction(s): Chest Pain    Patient Measurements: Height: 5' 2" (157.5 cm) Weight: 70 kg (154 lb 5.2 oz) IBW/kg (Calculated) : 50.1 Heparin Dosing Weight: 65.1 kg  Vital Signs: Temp: 100.1 F (37.8 C) (06/26 1717) Temp Source: Oral (06/26 1717) BP: 136/52 (06/26 1717) Pulse Rate: 64 (06/26 1717)  Labs: Recent Labs    02/29/20 1304 02/29/20 1440 02/29/20 2352 03/01/20 0458 03/01/20 1004 03/01/20 1504 03/01/20 2056  HGB 11.6*  --   --   --  11.0*  --   --   HCT 34.3*  --   --   --  34.4*  --   --   PLT 227  --   --   --  233  --   --   APTT  --  30  --   --   --   --   --   LABPROT  --  15.0  --   --   --   --   --   INR  --  1.2  --   --   --   --   --   HEPARINUNFRC  --   --    < >  --  <0.10* 0.43 0.33  CREATININE 0.92  --   --  0.95  --   --   --   TROPONINIHS 520* 497*  --   --   --   --   --    < > = values in this interval not displayed.    Estimated Creatinine Clearance: 43.3 mL/min (by C-G formula based on SCr of 0.95 mg/dL).   Assessment: 79 yo female to start on heparin drip for ACS. No oral anticoagulant noted on PTA med list   0625 2352 HL < 0.10, SUBtherapeutic.  0626 1000 HL < 0.10 Rate increased to 1300  0626 stat recheck of HL_0 = 0.43  Goal of Therapy:  Heparin level 0.3-0.7 units/ml Monitor platelets by anticoagulation protocol: Yes   Plan:  0626 @ 2056 HL 0.33 Level therapeutic x 1.  Will  Recheck confirmatory HL with AM labs. Continue to monitor heparin level and CBC daily per protocol.   Pernell Dupre, PharmD, BCPS Clinical Pharmacist 03/01/2020 9:34 PM

## 2020-03-01 NOTE — Progress Notes (Signed)
ANTICOAGULATION CONSULT NOTE -   Pharmacy Consult for Heparin Indication: chest pain/ACS  Allergies  Allergen Reactions  . Penicillin G Shortness Of Breath    Other reaction(s): Rash  . Ace Inhibitors Swelling  . Atorvastatin     Other reaction(s): Myalgias (Muscle Pain) Other reaction(s): Myalgias (muscle pain)  . Cortisone     Other reaction(s): Angioedema  . Hydrocortisone Other (See Comments)  . Lisinopril     Other reaction(s): Angioedema  . Meloxicam Other (See Comments)    Bloody stools  . Penicillins Hives  . Pravastatin     Other reaction(s): Chest Pain    Patient Measurements: Height: 5' 2" (157.5 cm) Weight: 70 kg (154 lb 5.2 oz) IBW/kg (Calculated) : 50.1 Heparin Dosing Weight: 65.1 kg  Vital Signs: Temp: 98 F (36.7 C) (06/26 0740) Temp Source: Oral (06/26 0740) BP: 111/55 (06/26 1500) Pulse Rate: 91 (06/26 1500)  Labs: Recent Labs    02/29/20 1304 02/29/20 1440 02/29/20 2352 03/01/20 0458 03/01/20 1004 03/01/20 1504  HGB 11.6*  --   --   --  11.0*  --   HCT 34.3*  --   --   --  34.4*  --   PLT 227  --   --   --  233  --   APTT  --  30  --   --   --   --   LABPROT  --  15.0  --   --   --   --   INR  --  1.2  --   --   --   --   HEPARINUNFRC  --   --  <0.10*  --  <0.10* 0.43  CREATININE 0.92  --   --  0.95  --   --   TROPONINIHS 520* 497*  --   --   --   --     Estimated Creatinine Clearance: 43.3 mL/min (by C-G formula based on SCr of 0.95 mg/dL).   Assessment: 79 yo female to start on heparin drip for ACS. No oral anticoagulant noted on PTA med list   0625 2352 HL < 0.10, SUBtherapeutic.  RN confirmed no problems w/ infusion.  Will rebolus w/ 2000 units and increase heparin rate to 1100 units/hr.  Goal of Therapy:  Heparin level 0.3-0.7 units/ml Monitor platelets by anticoagulation protocol: Yes   Plan:  0626 1000 HL < 0.10, SUBtherapeutic. (not reported by lab until ~1340 due to machine issues).  RN confirmed no problems w/  infusion.  Will rebolus w/ 2000 units and increase heparin rate to 1300 units/hr.   Will recheck a Stat HL. Will recheck HL at 2100 after rate increase  0626 stat recheck of HL_0 = 0.43 (pt did get bolus at 1414 and rate increase).  Will f/u HL this evening- checking at 6 hours instead of 8 to make sure adjustment doesn't push level too high (if the <0.10 level was not accurate).   Laini Urick A 03/01/2020,4:06 PM

## 2020-03-01 NOTE — Progress Notes (Signed)
ANTICOAGULATION CONSULT NOTE -   Pharmacy Consult for Heparin Indication: chest pain/ACS  Allergies  Allergen Reactions   Penicillin G Shortness Of Breath    Other reaction(s): Rash   Ace Inhibitors Swelling   Atorvastatin     Other reaction(s): Myalgias (Muscle Pain) Other reaction(s): Myalgias (muscle pain)   Cortisone     Other reaction(s): Angioedema   Hydrocortisone Other (See Comments)   Lisinopril     Other reaction(s): Angioedema   Meloxicam Other (See Comments)    Bloody stools   Penicillins Hives   Pravastatin     Other reaction(s): Chest Pain    Patient Measurements: Height: _0  (157.5 cm) Weight: 70 kg (154 lb 5.2 oz) IBW/kg (Calculated) : 50.1 Heparin Dosing Weight: 65.1 kg  Vital Signs: Temp: 98 F (36.7 C) (06/26 0740) Temp Source: Oral (06/26 0740) BP: 104/59 (06/26 1100) Pulse Rate: 82 (06/26 1100)  Labs: Recent Labs    02/29/20 1304 02/29/20 1440 02/29/20 2352 03/01/20 0458 03/01/20 1004  HGB 11.6*  --   --   --  11.0*  HCT 34.3*  --   --   --  34.4*  PLT 227  --   --   --  233  APTT  --  30  --   --   --   LABPROT  --  15.0  --   --   --   INR  --  1.2  --   --   --   HEPARINUNFRC  --   --  <0.10*  --  <0.10*  CREATININE 0.92  --   --  0.95  --   TROPONINIHS 520* 497*  --   --   --     Estimated Creatinine Clearance: 43.3 mL/min (by C-G formula based on SCr of 0.95 mg/dL).   Assessment: 79 yo female to start on heparin drip for ACS. No oral anticoagulant noted on PTA med list   0625 2352 HL < 0.10, SUBtherapeutic.  RN confirmed no problems w/ infusion.  Will rebolus w/ 2000 units and increase heparin rate to 1100 units/hr.  Goal of Therapy:  Heparin level 0.3-0.7 units/ml Monitor platelets by anticoagulation protocol: Yes   Plan:  0626 1000 HL < 0.10, SUBtherapeutic. (not reported by lab until ~1340 due to machine issues).  RN confirmed no problems w/ infusion.  Will rebolus w/ 2000 units and increase heparin rate to  1300 units/hr.   Will recheck a Stat HL. Will recheck HL at 2000 after rate increase(depending on stat level).    Mancil Pfenning A 03/01/2020,1:57 PM

## 2020-03-01 NOTE — Progress Notes (Signed)
Pt transported to 1C safely. Pt has remained calm and cooperative throughout shift. Vitals WNL. On 4L [place]. Still on heparin Gtt at 13. Report called in to [place], Therapist, sports. All belongings transported with pt to room 131.

## 2020-03-01 NOTE — Progress Notes (Signed)
*  PRELIMINARY RESULTS* Echocardiogram 2D Echocardiogram has been performed.  Lavell Luster Kaleem Sartwell 03/01/2020, 11:13 AM

## 2020-03-01 NOTE — Progress Notes (Signed)
ANTICOAGULATION CONSULT NOTE -   Pharmacy Consult for Heparin Indication: chest pain/ACS  Allergies  Allergen Reactions  . Penicillin G Shortness Of Breath    Other reaction(s): Rash  . Ace Inhibitors Swelling  . Atorvastatin     Other reaction(s): Myalgias (Muscle Pain) Other reaction(s): Myalgias (muscle pain)  . Cortisone     Other reaction(s): Angioedema  . Hydrocortisone Other (See Comments)  . Lisinopril     Other reaction(s): Angioedema  . Meloxicam Other (See Comments)    Bloody stools  . Penicillins Hives  . Pravastatin     Other reaction(s): Chest Pain    Patient Measurements: Height: _0  (157.5 cm) Weight: 65.5 kg (144 lb 6.4 oz) IBW/kg (Calculated) : 50.1 Heparin Dosing Weight: 65.1 kg  Vital Signs: Temp: 99.3 F (37.4 C) (06/25 1950) Temp Source: Oral (06/25 1950) BP: 105/48 (06/26 0000) Pulse Rate: 94 (06/26 0000)  Labs: Recent Labs    02/29/20 1304 02/29/20 1440 02/29/20 2352  HGB 11.6*  --   --   HCT 34.3*  --   --   PLT 227  --   --   APTT  --  30  --   LABPROT  --  15.0  --   INR  --  1.2  --   HEPARINUNFRC  --   --  <0.10*  CREATININE 0.92  --   --   TROPONINIHS 520* 497*  --     Estimated Creatinine Clearance: 43.3 mL/min (by C-G formula based on SCr of 0.92 mg/dL).   Assessment: 79 yo female to start on heparin drip for ACS. No oral anticoagulant noted on PTA med list   Goal of Therapy:  Heparin level 0.3-0.7 units/ml Monitor platelets by anticoagulation protocol: Yes   Plan:  Baseline aPTT and INR ordered Heparin bolus 3900 units IV x1 then heparin drip 800 units/hr Check HL 8-hour after start of heparin drip CBC in AM  0625 2352 HL < 0.10, SUBtherapeutic.  RN confirmed no problems w/ infusion.  Will rebolus w/ 2000 units and increase heparin rate to 1100 units/hr.  Will recheck HL 8 hours after rate increase.    [place] Crane, Campbell Agramonte A 03/01/2020,1:39 AM

## 2020-03-01 NOTE — Progress Notes (Signed)
Initial Nutrition Assessment  DOCUMENTATION CODES:   Not applicable  INTERVENTION:  Ensure Enlive po daily, each supplement provides 350 kcal and 20 grams of protein  NUTRITION DIAGNOSIS:   Inadequate oral intake related to acute illness, chronic illness (acute on chronic respiratory failure) as evidenced by per patient/family report, energy intake < 75% for > 7 days.  GOAL:   Patient will meet greater than or equal to 90% of their needs   MONITOR:   PO intake, Supplement acceptance, I & O's, Labs, Weight trends  REASON FOR ASSESSMENT:   Malnutrition Screening Tool    ASSESSMENT:  RD working remotely.  79 year old female admitted with acute on chronic respiratory failure in the setting of segmental PE to right lower lobe with right heart strain, acute decompensated CHF presented with complaints of worsening SOB over the past 2 weeks. Past medical history of COPD on 2L home O2, pulmonary HTN, lung cancer s/p right lower lobe resection, DM2,  Spoke with patient via phone this afternoon, reports good po intake of lunch meal, recalls eating almost all of the grilled chicken salad and pasta. Patient reports usually having a good appetite and intake at home. She endorses decreased po intake over the past couple of weeks due to feeling exhausted, stated that some days she would sleep all day and not eat anything. RD educated on small frequent meals/snacks throughout the day, recommended intake of oral supplement. Patient amenable to trying Ensure daily during admission to aid with meeting needs.   I/Os: -902 ml x 24 hrs UOP: 1300 ml x 24 hrs  Per chart, weights stable over the past 2 years.  Medications reviewed and include: SSI IV Heparin IVPB: Pepcid, Levaquin Labs: CBGs 155,125, BNP 1093 (H)  NUTRITION - FOCUSED PHYSICAL EXAM: Unable to complete at this time, RD working remotely.  Diet Order:   Diet Order            Diet Heart Room service appropriate? Yes; Fluid  consistency: Thin  Diet effective now                 EDUCATION NEEDS:   Education needs have been addressed  Skin:  Skin Assessment: Reviewed RN Assessment  Last BM:  pta  Height:   Ht Readings from Last 1 Encounters:  02/29/20 _0  (1.575 m)    Weight:   Wt Readings from Last 1 Encounters:  03/01/20 70 kg    Ideal Body Weight:  50 kg  BMI:  Body mass index is 28.23 kg/m.  Estimated Nutritional Needs:   Kcal:  1500-1700  Protein:  75-85  Fluid:  >/= 1.5 L/day   Lajuan Lines, RD, LDN Clinical Nutrition After Hours/Weekend Pager # in [place]

## 2020-03-02 DIAGNOSIS — R591 Generalized enlarged lymph nodes: Secondary | ICD-10-CM

## 2020-03-02 DIAGNOSIS — I2609 Other pulmonary embolism with acute cor pulmonale: Principal | ICD-10-CM

## 2020-03-02 DIAGNOSIS — I5031 Acute diastolic (congestive) heart failure: Secondary | ICD-10-CM

## 2020-03-02 DIAGNOSIS — J181 Lobar pneumonia, unspecified organism: Secondary | ICD-10-CM

## 2020-03-02 DIAGNOSIS — E119 Type 2 diabetes mellitus without complications: Secondary | ICD-10-CM

## 2020-03-02 DIAGNOSIS — J9621 Acute and chronic respiratory failure with hypoxia: Secondary | ICD-10-CM

## 2020-03-02 LAB — CBC
HCT: 33.4 % — ABNORMAL LOW (ref 36.0–46.0)
Hemoglobin: 10.8 g/dL — ABNORMAL LOW (ref 12.0–15.0)
MCH: 27.3 pg (ref 26.0–34.0)
MCHC: 32.3 g/dL (ref 30.0–36.0)
MCV: 84.3 fL (ref 80.0–100.0)
Platelets: 239 10*3/uL (ref 150–400)
RBC: 3.96 MIL/uL (ref 3.87–5.11)
RDW: 16.7 % — ABNORMAL HIGH (ref 11.5–15.5)
WBC: 7.6 10*3/uL (ref 4.0–10.5)
nRBC: 0.3 % — ABNORMAL HIGH (ref 0.0–0.2)

## 2020-03-02 LAB — HEPARIN LEVEL (UNFRACTIONATED): Heparin Unfractionated: 0.69 IU/mL (ref 0.30–0.70)

## 2020-03-02 LAB — GLUCOSE, CAPILLARY
Glucose-Capillary: 115 mg/dL — ABNORMAL HIGH (ref 70–99)
Glucose-Capillary: 156 mg/dL — ABNORMAL HIGH (ref 70–99)
Glucose-Capillary: 114 mg/dL — ABNORMAL HIGH (ref 70–99)
Glucose-Capillary: 134 mg/dL — ABNORMAL HIGH (ref 70–99)

## 2020-03-02 LAB — PROCALCITONIN: Procalcitonin: 0.61 ng/mL

## 2020-03-02 MED ORDER — APIXABAN 5 MG PO TABS: 5.0000 mg | ORAL_TABLET | Freq: Two times a day (BID) | ORAL | Status: DC

## 2020-03-02 MED ORDER — UMECLIDINIUM-VILANTEROL 62.5-25 MCG/INH IN AEPB
1.0000 | INHALATION_SPRAY | Freq: Every day | RESPIRATORY_TRACT | Status: DC
  Administered 2020-03-03: 10:00:00 1 via RESPIRATORY_TRACT
  Filled 2020-03-02: qty 14

## 2020-03-02 MED ORDER — FAMOTIDINE 20 MG PO TABS
20.0000 mg | ORAL_TABLET | Freq: Every day | ORAL | Status: DC
  Administered 2020-03-03: 20 mg via ORAL
  Filled 2020-03-02: qty 1

## 2020-03-02 MED ORDER — FUROSEMIDE 20 MG PO TABS
20.0000 mg | ORAL_TABLET | Freq: Every day | ORAL | Status: DC
  Administered 2020-03-03: 09:00:00 20 mg via ORAL
  Filled 2020-03-02: qty 1

## 2020-03-02 MED ORDER — APIXABAN 5 MG PO TABS
10.0000 mg | ORAL_TABLET | Freq: Two times a day (BID) | ORAL | Status: DC
  Administered 2020-03-02 – 2020-03-03 (×3): 10 mg via ORAL
  Filled 2020-03-02 (×3): qty 2

## 2020-03-02 NOTE — Progress Notes (Signed)
ANTICOAGULATION CONSULT NOTE - Initial Consult  Pharmacy Consult for Apixaban  Indication: pulmonary embolus  Allergies  Allergen Reactions  . Penicillin G Shortness Of Breath    Other reaction(s): Rash  . Ace Inhibitors Swelling  . Atorvastatin     Other reaction(s): Myalgias (Muscle Pain) Other reaction(s): Myalgias (muscle pain)  . Cortisone     Other reaction(s): Angioedema  . Hydrocortisone Other (See Comments)  . Lisinopril     Other reaction(s): Angioedema  . Meloxicam Other (See Comments)    Bloody stools  . Penicillins Hives  . Pravastatin     Other reaction(s): Chest Pain    Patient Measurements: Height: _0  (157.5 cm) Weight: 70 kg (154 lb 5.2 oz) IBW/kg (Calculated) : 50.1 Heparin Dosing Weight:    Vital Signs: Temp: 98.6 F (37 C) (06/27 0748) Temp Source: Oral (06/27 0748) BP: 105/62 (06/27 0748) Pulse Rate: 75 (06/27 0748)  Labs: Recent Labs     0000 02/29/20 1304 02/29/20 1440 02/29/20 2352 03/01/20 0458 03/01/20 1004 03/01/20 1004 03/01/20 1504 03/01/20 2056 03/02/20 0401  HGB   < > 11.6*  --   --   --  11.0*  --   --   --  10.8*  HCT  --  34.3*  --   --   --  34.4*  --   --   --  33.4*  PLT  --  227  --   --   --  233  --   --   --  239  APTT  --   --  30  --   --   --   --   --   --   --   LABPROT  --   --  15.0  --   --   --   --   --   --   --   INR  --   --  1.2  --   --   --   --   --   --   --   HEPARINUNFRC  --   --   --    < >  --  <0.10*   < > 0.43 0.33 0.69  CREATININE  --  0.92  --   --  0.95  --   --   --   --   --   TROPONINIHS  --  520* 497*  --   --   --   --   --   --   --    < > = values in this interval not displayed.    Estimated Creatinine Clearance: 43.3 mL/min (by C-G formula based on SCr of 0.95 mg/dL).   Medical History: Past Medical History:  Diagnosis Date  . COPD (chronic obstructive pulmonary disease) ([place])   . Diabetes ([place])   . Diverticulitis   . High cholesterol   . Lung cancer ([place])   .  Pulmonary hypertension ([place])   . Uterine cancer ([facility])     Medications:  Scheduled:  . apixaban  10 mg Oral BID   Followed by  . [START ON 03/09/2020] apixaban  5 mg Oral BID  . Chlorhexidine Gluconate Cloth  6 each Topical Daily  . feeding supplement (ENSURE ENLIVE)  237 mL Oral Q24H  . insulin aspart  0-15 Units Subcutaneous TID WC  . insulin aspart  0-5 Units Subcutaneous QHS  . sodium chloride flush  3 mL Intravenous Q12H   Infusions:  . sodium  chloride    . famotidine (PEPCID) IV Stopped (03/01/20 2200)  . levofloxacin (LEVAQUIN) IV      Assessment: 79 yo female to transition from heparin drip to Apixaban for +PE.   Goal of Therapy:  Monitor platelets by anticoagulation protocol: Yes   Plan:  Will begin Apixban 10 mg PO bid x 7 days followed by 5 mg BID for pulmonary embolism treatment.  Will stop Heparin drip when apixaban administered  Ithiel Liebler A 03/02/2020,8:49 AM

## 2020-03-02 NOTE — Progress Notes (Signed)
ANTICOAGULATION CONSULT NOTE -   Pharmacy Consult for Heparin Indication: chest pain/ACS  Allergies  Allergen Reactions  . Penicillin G Shortness Of Breath    Other reaction(s): Rash  . Ace Inhibitors Swelling  . Atorvastatin     Other reaction(s): Myalgias (Muscle Pain) Other reaction(s): Myalgias (muscle pain)  . Cortisone     Other reaction(s): Angioedema  . Hydrocortisone Other (See Comments)  . Lisinopril     Other reaction(s): Angioedema  . Meloxicam Other (See Comments)    Bloody stools  . Penicillins Hives  . Pravastatin     Other reaction(s): Chest Pain    Patient Measurements: Height: 5' 2" (157.5 cm) Weight: 70 kg (154 lb 5.2 oz) IBW/kg (Calculated) : 50.1 Heparin Dosing Weight: 65.1 kg  Vital Signs: Temp: 99.8 F (37.7 C) (06/27 0041) Temp Source: Oral (06/27 0041) BP: 104/54 (06/27 0041) Pulse Rate: 89 (06/27 0041)  Labs: Recent Labs     0000 02/29/20 1304 02/29/20 1440 02/29/20 2352 03/01/20 0458 03/01/20 1004 03/01/20 1004 03/01/20 1504 03/01/20 2056 03/02/20 0401  HGB   < > 11.6*  --   --   --  11.0*  --   --   --  10.8*  HCT  --  34.3*  --   --   --  34.4*  --   --   --  33.4*  PLT  --  227  --   --   --  233  --   --   --  239  APTT  --   --  30  --   --   --   --   --   --   --   LABPROT  --   --  15.0  --   --   --   --   --   --   --   INR  --   --  1.2  --   --   --   --   --   --   --   HEPARINUNFRC  --   --   --    < >  --  <0.10*   < > 0.43 0.33 0.69  CREATININE  --  0.92  --   --  0.95  --   --   --   --   --   TROPONINIHS  --  520* 497*  --   --   --   --   --   --   --    < > = values in this interval not displayed.    Estimated Creatinine Clearance: 43.3 mL/min (by C-G formula based on SCr of 0.95 mg/dL).   Assessment: 79 yo female to start on heparin drip for ACS. No oral anticoagulant noted on PTA med list   0625 2352 HL < 0.10, SUBtherapeutic.  0626 1000 HL < 0.10 Rate increased to 1300  0626 stat recheck of  HL_0 = 0.43  Goal of Therapy:  Heparin level 0.3-0.7 units/ml Monitor platelets by anticoagulation protocol: Yes   Plan:  0627 @ 0401 HL 0.69, Level therapeutic x 2. CBC stable.  Will continue current rate and recheck HL and CBC daily  Ena Dawley, PharmD Clinical Pharmacist 03/02/2020 4:37 AM

## 2020-03-02 NOTE — Progress Notes (Signed)
Physical Therapy Evaluation Patient Details Name: Brianna Adams MRN: [id number] DOB: September 05, 1941 Today's Date: 03/02/2020   History of Present Illness  Per MD note: 79 year old female with a past medical history significant for oxygen dependent COPD, pulmonary hypertension, lung cancer s/p right lower lobe resection, and type 2 diabetes who presented to the ED on 02/29/20 for a 2 week history of progressive shortness of breath.  Workup in the ED has been significant for chest xray revealing possible interstitial pulmonary edema, O2 sats on 2L of O2 reported in the 60s, BNP of 1357.6, high sensitivity troponin elevated x 2 at 520 and 497 respectively, and ECG revealing sinus tachycardia with diffuse ST depressions. Chest ct showed large pulmonary embolus. Echo showed moderate to severe tr with estimated rvsp near 60 mmHg. Hemodynamicaly stable.  Clinical Impression  Patient agrees to PT evaluation, daughter is in the room. She is asked questions about home set up and living arrangements and PLOF. Patient performs supine to sit bed mobility and O2 saturation is taken with pulse oximeter and is 68 %. Call bell is immediately pressed to get help. O2 continues to decrease to 65%. When nursing arrives it is found out that the battery in portable O2 unit attached to patient is dead. It is also discovered that the O2 tubing is not connected to the O2 wall unit. After the O2 tubing is attached to the wall, the O2 saturation begins to improve and after several minutes gets to 90%. Patient evaluation is able to be performed. Patients BLE is 3/5 hip and knee. She is MI for bed mobility, transfers and gait with RW for 25 feet with 4 L O2 and decreased saturation to 86% and recovery within a minute. She then needs to use the comode to urinate and is transfered MI to comode <> bed side. She is assisted sit to supine, covers and call bell arranged. She has saturation 92% with 4 L O2 in supine at end of PT session. Patient will  continue to benefit from skilled PT to improve mobility.     Follow Up Recommendations Home health PT    Equipment Recommendations  Rolling walker with 5" wheels    Recommendations for Other Services       Precautions / Restrictions Precautions Precautions: None Restrictions Weight Bearing Restrictions: No      Mobility  Bed Mobility Overal bed mobility: Modified Independent             General bed mobility comments: safety for sequencing and liines  Transfers Overall transfer level: Needs assistance Equipment used: Rolling walker (2 wheeled) Transfers: Sit to/from Stand Sit to Stand: Modified independent (Device/Increase time)         General transfer comment: saety for lines and cords  Ambulation/Gait Ambulation/Gait assistance: Supervision Gait Distance (Feet): 25 Feet Assistive device: Rolling walker (2 wheeled) Gait Pattern/deviations: Step-to pattern     General Gait Details:  (O2 sat decreased to 86%)  Stairs            Wheelchair Mobility    Modified Rankin (Stroke Patients Only)       Balance Overall balance assessment: Modified Independent                                           Pertinent Vitals/Pain Pain Assessment: No/denies pain    Home Living Family/patient expects to be discharged to::  Private residence Living Arrangements: Children Available Help at Discharge: Family Type of Home: House Home Access: Stairs to enter Entrance Stairs-Rails: None Technical brewer of Steps: 2 Home Layout: One level        Prior Function Level of Independence: Independent               Hand Dominance   Dominant Hand: Right    Extremity/Trunk Assessment   Upper Extremity Assessment Upper Extremity Assessment: Overall WFL for tasks assessed    Lower Extremity Assessment Lower Extremity Assessment: Overall WFL for tasks assessed       Communication   Communication: No difficulties  Cognition  Arousal/Alertness: Awake/alert Behavior During Therapy: WFL for tasks assessed/performed Overall Cognitive Status: Within Functional Limits for tasks assessed                                        General Comments      Exercises     Assessment/Plan    PT Assessment Patient needs continued PT services  PT Problem List Decreased mobility       PT Treatment Interventions Gait training;Therapeutic activities;Therapeutic exercise    PT Goals (Current goals can be found in the Care Plan section)  Acute Rehab PT Goals Patient Stated Goal:  (to go home) PT Goal Formulation: Patient unable to participate in goal setting Time For Goal Achievement: 03/09/20 Potential to Achieve Goals: Good    Frequency Min 2X/week   Barriers to discharge        Co-evaluation               AM-PAC PT "6 Clicks" Mobility  Outcome Measure Help needed turning from your back to your side while in a flat bed without using bedrails?: A Little Help needed moving from lying on your back to sitting on the side of a flat bed without using bedrails?: A Little Help needed moving to and from a bed to a chair (including a wheelchair)?: A Little Help needed standing up from a chair using your arms (e.g., wheelchair or bedside chair)?: A Little Help needed to walk in hospital room?: A Little Help needed climbing 3-5 steps with a railing? : A Little 6 Click Score: 18    End of Session Equipment Utilized During Treatment: Gait belt;Oxygen;Other (comment) Activity Tolerance: Patient limited by fatigue (pulse ox) Patient left: in bed;with bed alarm set Nurse Communication: Mobility status PT Visit Diagnosis: Difficulty in walking, not elsewhere classified (R26.2)    Time: 1950-9326 PT Time Calculation (min) (ACUTE ONLY): 45 min   Charges:   PT Evaluation $PT Eval Low Complexity: 1 Low PT Treatments $Gait Training: 8-22 mins $Therapeutic Activity: 8-22 mins           Alanson Puls, PT DPT 03/02/2020, 5:01 PM

## 2020-03-02 NOTE — Progress Notes (Addendum)
Patient ID: Brianna Adams, female   DOB: [birth year], 79 y.o.   MRN: [id number] [facility] PROGRESS NOTE  Brianna Adams YQM:[id number] DOB: [birth year] DOA: 02/29/2020 PCP: [name], [name]  HPI/Subjective: Patient admitted with pulmonary embolism.  Patient feeling okay.  Little short of breath.  Has not walked since she has come in yet.  No chest pain.  Objective: Vitals:   03/02/20 1159 03/02/20 1253  BP:  108/68  Pulse:  82  Resp:  20  Temp:  (!) 97.5 F (36.4 C)  SpO2: 91% 91%    Intake/Output Summary (Last 24 hours) at 03/02/2020 1313 Last data filed at 03/02/2020 1023 Gross per 24 hour  Intake 562.44 ml  Output 400 ml  Net 162.44 ml   Filed Weights   02/29/20 1219 02/29/20 1850 03/01/20 0300  Weight: 71 kg 65.5 kg 70 kg    ROS: Review of Systems  Respiratory: Positive for shortness of breath. Negative for cough.   Cardiovascular: Negative for chest pain.  Gastrointestinal: Negative for abdominal pain.   Exam: Physical Exam HENT:     Nose: No mucosal edema.     Mouth/Throat:     Pharynx: No oropharyngeal exudate.  Eyes:     General: Lids are normal.     Extraocular Movements: Extraocular movements intact.     Pupils: Pupils are equal, round, and reactive to light.  Cardiovascular:     Rate and Rhythm: Normal rate and regular rhythm.     Heart sounds: Normal heart sounds, S1 normal and S2 normal. No murmur heard.   Pulmonary:     Breath sounds: Examination of the right-lower field reveals decreased breath sounds. Examination of the left-lower field reveals decreased breath sounds. Decreased breath sounds present. No wheezing, rhonchi or rales.  Abdominal:     Palpations: Abdomen is soft.     Tenderness: There is no abdominal tenderness.  Musculoskeletal:     Right ankle: No swelling.     Left ankle: No swelling.  Skin:    General: Skin is warm.  Neurological:     Mental Status: She is alert and oriented to person, place, and time.       Data  Reviewed: Basic Metabolic Panel: Recent Labs  Lab 02/29/20 1304 03/01/20 0458  NA 134* 136  K 3.9 3.6  CL 103 103  CO2 19* 23  GLUCOSE 190* 147*  BUN 13 15  CREATININE 0.92 0.95  CALCIUM 8.8* 8.5*   CBC: Recent Labs  Lab 02/29/20 1304 03/01/20 1004 03/02/20 0401  WBC 12.6* 10.1 7.6  NEUTROABS 10.8*  --   --   HGB 11.6* 11.0* 10.8*  HCT 34.3* 34.4* 33.4*  MCV 82.7 85.4 84.3  PLT 227 233 239   BNP (last 3 results) Recent Labs    02/29/20 1304 03/01/20 0458  BNP 1,357.6* 1,093.5*     CBG: Recent Labs  Lab 03/01/20 1101 03/01/20 1528 03/01/20 2151 03/02/20 0747 03/02/20 1300  GLUCAP 155* 122* 95 115* 156*    Recent Results (from the past 240 hour(s))  SARS Coronavirus 2 by RT PCR (hospital order, performed in [facility] hospital lab) Nasopharyngeal Nasopharyngeal Swab     Status: None   Collection Time: 02/29/20 12:26 PM   Specimen: Nasopharyngeal Swab  Result Value Ref Range Status   SARS Coronavirus 2 NEGATIVE NEGATIVE Final    Comment: (NOTE) SARS-CoV-2 target nucleic acids are NOT DETECTED.  The SARS-CoV-2 RNA is generally detectable in upper and lower respiratory specimens during the acute  phase of infection. The lowest concentration of SARS-CoV-2 viral copies this assay can detect is 250 copies / mL. A negative result does not preclude SARS-CoV-2 infection and should not be used as the sole basis for treatment or other patient management decisions.  A negative result may occur with improper specimen collection / handling, submission of specimen other than nasopharyngeal swab, presence of viral mutation(s) within the areas targeted by this assay, and inadequate number of viral copies (<250 copies / mL). A negative result must be combined with clinical observations, patient history, and epidemiological information.  Fact Sheet for Patients:   StrictlyIdeas.no  Fact Sheet for Healthcare  Providers: BankingDealers.co.za  This test is not yet approved or  cleared by the [place] FDA and has been authorized for detection and/or diagnosis of SARS-CoV-2 by FDA under an Emergency Use Authorization (EUA).  This EUA will remain in effect (meaning this test can be used) for the duration of the COVID-19 declaration under Section 564(b)(1) of the Act, 21 U.S.C. section 360bbb-3(b)(1), unless the authorization is terminated or revoked sooner.  Performed at [facility], [address] [place], [place] [address]   MRSA PCR Screening     Status: None   Collection Time: 02/29/20  6:53 PM   Specimen: Nasopharyngeal  Result Value Ref Range Status   MRSA by PCR NEGATIVE NEGATIVE Final    Comment:        The GeneXpert MRSA Assay (FDA approved for NASAL specimens only), is one component of a comprehensive MRSA colonization surveillance program. It is not intended to diagnose MRSA infection nor to guide or monitor treatment for MRSA infections. Performed at [facility], [address] [place], [place] [address]      Studies: CT Angio Chest PE W/Cm &/Or Wo Cm  Result Date: 02/29/2020 CLINICAL DATA:  79 year old female with history of shortness of breath for the past 2 weeks. EXAM: CT ANGIOGRAPHY CHEST WITH CONTRAST TECHNIQUE: Multidetector CT imaging of the chest was performed using the standard protocol during bolus administration of intravenous contrast. Multiplanar CT image reconstructions and MIPs were obtained to evaluate the vascular anatomy. CONTRAST:  84m OMNIPAQUE IOHEXOL 350 MG/ML SOLN COMPARISON:  Chest CT 09/27/2019. FINDINGS: Cardiovascular: Study is severely limited by large amount of patient respiratory motion. With this limitation in mind, there is at least one filling defect within a segmental sized pulmonary artery branch to the right lower lobe best appreciated on axial image 169 of series 5), compatible with  pulmonary embolism. No central or lobar filling defect is noted. Heart size is enlarged with right ventricular and right atrial dilatation. Right ventricular dilatation (estimated right ventricular diameter of 5.7 cm versus 2.6 cm for the left ventricle). Right to left of the interatrial septum, indicative of elevated right heart pressures. There is no significant pericardial fluid, thickening or pericardial calcification. There is aortic atherosclerosis, as well as atherosclerosis of the great vessels of the mediastinum and the coronary arteries, including calcified atherosclerotic plaque in the left main and left anterior descending coronary arteries. Mediastinum/Nodes: Multiple borderline enlarged and mildly enlarged mediastinal and bilateral hilar lymph nodes are noted measuring up to 1.6 cm in short axis in the right paratracheal nodal station, markedly increased compared to the prior study. Esophagus is unremarkable in appearance. No axillary lymphadenopathy. Lungs/Pleura: Widespread areas of ground-glass attenuation and interlobular septal thickening in the lungs bilaterally, suggesting a background of interstitial pulmonary edema, superimposed upon a background of moderate centrilobular and mild paraseptal emphysema. Increasingly evident  mass-like architectural distortion in the periphery of the lingula, likely to reflect evolving postradiation changes. New irregular areas of probable airspace consolidation in the left lower lobe, concerning for potential pneumonia or sequela of aspiration. Trace bilateral pleural effusions lying dependently. Upper Abdomen: Unremarkable. Musculoskeletal: Old healing fracture of the lateral aspect of the left fifth rib again noted. There are no aggressive appearing lytic or blastic lesions noted in the visualized portions of the skeleton. Review of the MIP images confirms the above findings. IMPRESSION: 1. Study is positive for pulmonary embolism with a segmental sized  embolus to the right lower lobe. There is also cardiomegaly with dilated right atrium and right ventricle with RV to LV ratio of 2.2 indicative of possible right heart strain. The presence of right heart strain has been associated with an increased risk of morbidity and mortality. Please refer to the "PE Focused" order set in EPIC. 2. New area of apparent airspace consolidation in the left lower lobe concerning for pneumonia or sequela of recent aspiration. 3. Worsening mediastinal and bilateral hilar lymphadenopathy, concerning for potential metastatic disease. 4. Evidence of interstitial pulmonary edema and trace bilateral pleural effusions; imaging findings which could suggest mild congestive heart failure. 5. Diffuse bronchial wall thickening with moderate centrilobular and mild paraseptal emphysema; imaging findings concerning for underlying COPD. 6. Evolving postradiation changes in the lingula. 7. Healing fracture of the lateral aspect of the left fifth rib. 8. Aortic atherosclerosis, in addition to left main and left anterior descending coronary artery disease. Critical Value/emergent results were called by telephone at the time of interpretation on 02/29/2020 at 3:55 pm to provider [facility], who verbally acknowledged these results. Aortic Atherosclerosis (ICD10-I70.0) and Emphysema (ICD10-J43.9). Electronically Signed   By: Vinnie Langton M.D.   On: 02/29/2020 15:58   US Venous Img Lower Bilateral (DVT)  Result Date: 02/29/2020 CLINICAL DATA:  79 year old female with pulmonary embolism. EXAM: Bilateral LOWER EXTREMITY VENOUS DOPPLER ULTRASOUND TECHNIQUE: Gray-scale sonography with compression, as well as color and duplex ultrasound, were performed to evaluate the deep venous system(s) from the level of the common femoral vein through the popliteal and proximal calf veins. COMPARISON:  None. FINDINGS: VENOUS Normal compressibility of the common femoral, superficial femoral, and popliteal veins, as  well as the visualized calf veins. Visualized portions of profunda femoral vein and great saphenous vein unremarkable. No filling defects to suggest DVT on grayscale or color Doppler imaging. Doppler waveforms show normal direction of venous flow, normal respiratory plasticity and response to augmentation. Limited views of the contralateral common femoral vein are unremarkable. OTHER None. Limitations: none IMPRESSION: Negative. Electronically Signed   By: Anner Crete M.D.   On: 02/29/2020 22:33   ECHOCARDIOGRAM COMPLETE  Result Date: 03/01/2020    ECHOCARDIOGRAM REPORT   Patient Name:   Brianna Adams Date of Exam: 03/01/2020 Medical Rec #:  [id number]     Height:       62.0 in Accession #:    [id number]    Weight:       154.3 lb Date of Birth:  Apr 18, 1941      BSA:          1.712 m Patient Age:    60 years      BP:           94/56 mmHg Patient Gender: F             HR:           87 bpm. Exam Location:  [facility] Procedure:  2D Echo Indications:     Acute Respiratory Insufficiency 518.82/ R06.89  History:         Patient has no prior history of Echocardiogram examinations.  Sonographer:     Arville Go RDCS Referring Phys:  [id number] [place] Diagnosing Phys: Bartholome Bill MD IMPRESSIONS  1. Left ventricular ejection fraction, by estimation, is 55 to 60%. The left ventricle has normal function. The left ventricle has no regional wall motion abnormalities. Left ventricular diastolic parameters were normal.  2. Right ventricular systolic function is mildly reduced. The right ventricular size is moderately enlarged. There is severely elevated pulmonary artery systolic pressure.  3. Right atrial size was mildly dilated.  4. The mitral valve is grossly normal. Moderate mitral valve regurgitation.  5. The aortic valve is tricuspid. Aortic valve regurgitation is not visualized. FINDINGS  Left Ventricle: Left ventricular ejection fraction, by estimation, is 55 to 60%. The left ventricle has normal function. The  left ventricle has no regional wall motion abnormalities. The left ventricular internal cavity size was normal in size. There is  borderline left ventricular hypertrophy. Left ventricular diastolic parameters were normal. Right Ventricle: The right ventricular size is moderately enlarged. No increase in right ventricular wall thickness. Right ventricular systolic function is mildly reduced. There is severely elevated pulmonary artery systolic pressure. The tricuspid regurgitant velocity is 3.59 m/s, and with an assumed right atrial pressure of 10 mmHg, the estimated right ventricular systolic pressure is 27.0 mmHg. Left Atrium: Left atrial size was normal in size. Right Atrium: Right atrial size was mildly dilated. Pericardium: There is no evidence of pericardial effusion. Mitral Valve: The mitral valve is grossly normal. Moderate mitral valve regurgitation. Tricuspid Valve: The tricuspid valve is grossly normal. Tricuspid valve regurgitation is mild. Aortic Valve: The aortic valve is tricuspid. Aortic valve regurgitation is not visualized. Aortic valve peak gradient measures 6.8 mmHg. Pulmonic Valve: The pulmonic valve was not well visualized. Pulmonic valve regurgitation is trivial. Aorta: The aortic root is normal in size and structure. IAS/Shunts: The interatrial septum was not assessed.  LEFT VENTRICLE PLAX 2D LVIDd:         3.71 cm  Diastology LVIDs:         2.76 cm  LV e' lateral:   6.85 cm/s LV PW:         1.24 cm  LV E/e' lateral: 10.3 LV IVS:        1.27 cm  LV e' medial:    2.83 cm/s LVOT diam:     1.90 cm  LV E/e' medial:  25.0 LV SV:         34 LV SV Index:   20 LVOT Area:     2.84 cm  RIGHT VENTRICLE RV Basal diam:  3.88 cm RV S prime:     9.90 cm/s TAPSE (M-mode): 1.5 cm LEFT ATRIUM           Index       RIGHT ATRIUM           Index LA diam:      3.60 cm 2.10 cm/m  RA Area:     18.55 cm LA Vol (A2C): 22.3 ml 13.02 ml/m RA Volume:   58.95 ml  34.43 ml/m LA Vol (A4C): 20.1 ml 11.74 ml/m  AORTIC  VALVE AV Area (Vmax): 1.58 cm AV Vmax:        130.00 cm/s AV Peak Grad:   6.8 mmHg LVOT Vmax:      72.50 cm/s LVOT  Vmean:     49.300 cm/s LVOT VTI:       0.121 m  AORTA Ao Root diam: 2.70 cm Ao Asc diam:  2.60 cm MITRAL VALVE               TRICUSPID VALVE MV Area (PHT): 3.42 cm    TV Peak grad:   51.6 mmHg MV Decel Time: 222 msec    TV Vmax:        3.59 m/s MV E velocity: 70.70 cm/s  TR Peak grad:   51.6 mmHg MV A velocity: 69.00 cm/s  TR Vmax:        359.00 cm/s MV E/A ratio:  1.02                            SHUNTS                            Systemic VTI:  0.12 m                            Systemic Diam: 1.90 cm Bartholome Bill MD Electronically signed by Bartholome Bill MD Signature Date/Time: 03/01/2020/11:37:40 AM    Final     Scheduled Meds: . apixaban  10 mg Oral BID   Followed by  . [START ON 03/09/2020] apixaban  5 mg Oral BID  . Chlorhexidine Gluconate Cloth  6 each Topical Daily  . feeding supplement (ENSURE ENLIVE)  237 mL Oral Q24H  . insulin aspart  0-15 Units Subcutaneous TID WC  . insulin aspart  0-5 Units Subcutaneous QHS  . sodium chloride flush  3 mL Intravenous Q12H   Continuous Infusions: . sodium chloride    . famotidine (PEPCID) IV 20 mg (03/02/20 1115)  . levofloxacin (LEVAQUIN) IV      Assessment/Plan:  1. Acute pulmonary embolism with right heart strain.  Patient on heparin drip this am and I will switch over to Eliquis for anticoagulation.  Risk of bleeding explained to patient and daughter. 2. Lobar pneumonia on the left.  Patient on Levaquin 3. Acute diastolic CHF documented by cardiology.  Blood pressure too low for medications.  We will try Lasix 20 mg daily and see what happens. 4. Acute on chronic hypoxic respiratory failure.  Patient admitted with respiratory distress.  Patient doing much better now and on 3 L nasal cannula and normally wears 2 L.  We will try to titrate down to 2 L today.  Restart Anoro Ellipta. 5. Type 2 diabetes mellitus with good hemoglobin A1c  of 5.8.  Can probably go home on no diabetic medications 6. History of lung cancer with lymphadenopathy in the chest.  Will recommend getting back to her pulmonologist Dr. [name] and after treatment of pneumonia can get a PET CT scan as outpatient to determine if the lymphadenopathy is secondary to cancerous process or pneumonia.    Code Status:     Code Status Orders  (From admission, onward)         Start     Ordered   02/29/20 1639  Full code  Continuous        02/29/20 1640        Code Status History    This patient has a current code status but no historical code status.   Advance Care Planning Activity     Family Communication: Spoke with  the patient's daughter on the phone Disposition Plan: Status is: Inpatient  Dispo: The patient is from: Home              Anticipated d/c is to: Home              Anticipated d/c date is: Potentially 03/03/2020              Patient currently being treated for pulmonary embolism.  Converting from heparin drip over to Eliquis.  Has not walked since coming into the hospital.  Also trying to taper down to her baseline oxygen today.  Physical therapy evaluation ordered.  Consultants:  Cardiology  Antibiotics:  Levaquin  Time spent: 32 minutes  [place]

## 2020-03-02 NOTE — Progress Notes (Signed)
Patient Name: Brianna Adams Date of Encounter: 03/02/2020  Hospital Problem List     Active Problems:   Pulmonary embolism (HCC)   Lobar pneumonia (HCC)   Acute diastolic CHF (congestive heart failure) (HCC)   Acute on chronic respiratory failure with hypoxia [facility])   Lymphadenopathy    Patient Profile     79 year old female with a past medical history significant for oxygen dependent COPD, pulmonary hypertension, lung cancer s/p right lower lobe resection, and type 2 diabetes who presented to the ED on 02/29/20 for a 2 week history of progressive shortness of breath. Workup in the ED has been significant for chest xray revealing possible interstitial pulmonary edema, O2 sats on 2L of O2 reported in the 60s,BNP of 1357.6, high sensitivity troponin elevated x 2 at 520 and 497 respectively, and ECG revealing sinus tachycardia with diffuse ST depressions. Chest ct showed large pulmonary embolus. Echo showed moderate to severe tr with estimated rvsp near 60 mmHg. Hemodynamicaly stable.   Subjective   No sob  Inpatient Medications     apixaban  10 mg Oral BID   Followed by   Derrill Memo ON 03/09/2020] apixaban  5 mg Oral BID   Chlorhexidine Gluconate Cloth  6 each Topical Daily   [START ON 03/03/2020] famotidine  20 mg Oral Daily   feeding supplement (ENSURE ENLIVE)  237 mL Oral Q24H   furosemide  20 mg Oral Daily   insulin aspart  0-15 Units Subcutaneous TID WC   insulin aspart  0-5 Units Subcutaneous QHS   sodium chloride flush  3 mL Intravenous Q12H   umeclidinium-vilanterol  1 puff Inhalation Daily    Vital Signs    Vitals:   03/02/20 0451 03/02/20 0748 03/02/20 1159 03/02/20 1253  BP: 112/69 105/62  108/68  Pulse: 82 75  82  Resp: (!) _0 Temp: 98.6 F (37 C) 98.6 F (37 C)  (!) 97.5 F (36.4 C)  TempSrc: Oral Oral  Oral  SpO2:  93% 91% 91%  Weight:      Height:        Intake/Output Summary (Last 24 hours) at 03/02/2020 1524 Last data filed at  03/02/2020 1023 Gross per 24 hour  Intake 360 ml  Output 400 ml  Net -40 ml   Filed Weights   02/29/20 1219 02/29/20 1850 03/01/20 0300  Weight: 71 kg 65.5 kg 70 kg    Physical Exam    GEN: Well nourished, well developed, in no acute distress.  HEENT: normal.  Neck: Supple, no JVD, carotid bruits, or masses. Cardiac: RRR, no murmurs, rubs, or gallops. No clubbing, cyanosis, edema.  Radials/DP/PT 2+ and equal bilaterally.  Respiratory:  Respirations regular and unlabored, clear to auscultation bilaterally. GI: Soft, nontender, nondistended, BS + x 4. MS: no deformity or atrophy. Skin: warm and dry, no rash. Neuro:  Strength and sensation are intact. Psych: Normal affect.  Labs    CBC Recent Labs    02/29/20 1304 02/29/20 1304 03/01/20 1004 03/02/20 0401  WBC 12.6*   < > 10.1 7.6  NEUTROABS 10.8*  --   --   --   HGB 11.6*   < > 11.0* 10.8*  HCT 34.3*   < > 34.4* 33.4*  MCV 82.7   < > 85.4 84.3  PLT 227   < > 233 239   < > = values in this interval not displayed.   Basic Metabolic Panel Recent Labs    02/29/20 1304  03/01/20 0458  NA 134* 136  K 3.9 3.6  CL 103 103  CO2 19* 23  GLUCOSE 190* 147*  BUN 13 15  CREATININE 0.92 0.95  CALCIUM 8.8* 8.5*   Liver Function Tests No results for input(s): AST, ALT, ALKPHOS, BILITOT, PROT, ALBUMIN in the last 72 hours. No results for input(s): LIPASE, AMYLASE in the last 72 hours. Cardiac Enzymes No results for input(s): CKTOTAL, CKMB, CKMBINDEX, TROPONINI in the last 72 hours. BNP Recent Labs    02/29/20 1304 03/01/20 0458  BNP 1,357.6* 1,093.5*   D-Dimer No results for input(s): DDIMER in the last 72 hours. Hemoglobin A1C No results for input(s): HGBA1C in the last 72 hours. Fasting Lipid Panel No results for input(s): CHOL, HDL, LDLCALC, TRIG, CHOLHDL, LDLDIRECT in the last 72 hours. Thyroid Function Tests No results for input(s): TSH, T4TOTAL, T3FREE, THYROIDAB in the last 72 hours.  Invalid input(s):  FREET3  Telemetry    nsr  ECG    nsr  Radiology    CT Angio Chest PE W/Cm &/Or Wo Cm  Result Date: 02/29/2020 CLINICAL DATA:  79 year old female with history of shortness of breath for the past 2 weeks. EXAM: CT ANGIOGRAPHY CHEST WITH CONTRAST TECHNIQUE: Multidetector CT imaging of the chest was performed using the standard protocol during bolus administration of intravenous contrast. Multiplanar CT image reconstructions and MIPs were obtained to evaluate the vascular anatomy. CONTRAST:  68m OMNIPAQUE IOHEXOL 350 MG/ML SOLN COMPARISON:  Chest CT 09/27/2019. FINDINGS: Cardiovascular: Study is severely limited by large amount of patient respiratory motion. With this limitation in mind, there is at least one filling defect within a segmental sized pulmonary artery branch to the right lower lobe best appreciated on axial image 169 of series 5), compatible with pulmonary embolism. No central or lobar filling defect is noted. Heart size is enlarged with right ventricular and right atrial dilatation. Right ventricular dilatation (estimated right ventricular diameter of 5.7 cm versus 2.6 cm for the left ventricle). Right to left of the interatrial septum, indicative of elevated right heart pressures. There is no significant pericardial fluid, thickening or pericardial calcification. There is aortic atherosclerosis, as well as atherosclerosis of the great vessels of the mediastinum and the coronary arteries, including calcified atherosclerotic plaque in the left main and left anterior descending coronary arteries. Mediastinum/Nodes: Multiple borderline enlarged and mildly enlarged mediastinal and bilateral hilar lymph nodes are noted measuring up to 1.6 cm in short axis in the right paratracheal nodal station, markedly increased compared to the prior study. Esophagus is unremarkable in appearance. No axillary lymphadenopathy. Lungs/Pleura: Widespread areas of ground-glass attenuation and interlobular septal  thickening in the lungs bilaterally, suggesting a background of interstitial pulmonary edema, superimposed upon a background of moderate centrilobular and mild paraseptal emphysema. Increasingly evident mass-like architectural distortion in the periphery of the lingula, likely to reflect evolving postradiation changes. New irregular areas of probable airspace consolidation in the left lower lobe, concerning for potential pneumonia or sequela of aspiration. Trace bilateral pleural effusions lying dependently. Upper Abdomen: Unremarkable. Musculoskeletal: Old healing fracture of the lateral aspect of the left fifth rib again noted. There are no aggressive appearing lytic or blastic lesions noted in the visualized portions of the skeleton. Review of the MIP images confirms the above findings. IMPRESSION: 1. Study is positive for pulmonary embolism with a segmental sized embolus to the right lower lobe. There is also cardiomegaly with dilated right atrium and right ventricle with RV to LV ratio of 2.2 indicative of possible right  heart strain. The presence of right heart strain has been associated with an increased risk of morbidity and mortality. Please refer to the "PE Focused" order set in EPIC. 2. New area of apparent airspace consolidation in the left lower lobe concerning for pneumonia or sequela of recent aspiration. 3. Worsening mediastinal and bilateral hilar lymphadenopathy, concerning for potential metastatic disease. 4. Evidence of interstitial pulmonary edema and trace bilateral pleural effusions; imaging findings which could suggest mild congestive heart failure. 5. Diffuse bronchial wall thickening with moderate centrilobular and mild paraseptal emphysema; imaging findings concerning for underlying COPD. 6. Evolving postradiation changes in the lingula. 7. Healing fracture of the lateral aspect of the left fifth rib. 8. Aortic atherosclerosis, in addition to left main and left anterior descending coronary  artery disease. Critical Value/emergent results were called by telephone at the time of interpretation on 02/29/2020 at 3:55 pm to provider [facility], who verbally acknowledged these results. Aortic Atherosclerosis (ICD10-I70.0) and Emphysema (ICD10-J43.9). Electronically Signed   By: Vinnie Langton M.D.   On: 02/29/2020 15:58   US Venous Img Lower Bilateral (DVT)  Result Date: 02/29/2020 CLINICAL DATA:  79 year old female with pulmonary embolism. EXAM: Bilateral LOWER EXTREMITY VENOUS DOPPLER ULTRASOUND TECHNIQUE: Gray-scale sonography with compression, as well as color and duplex ultrasound, were performed to evaluate the deep venous system(s) from the level of the common femoral vein through the popliteal and proximal calf veins. COMPARISON:  None. FINDINGS: VENOUS Normal compressibility of the common femoral, superficial femoral, and popliteal veins, as well as the visualized calf veins. Visualized portions of profunda femoral vein and great saphenous vein unremarkable. No filling defects to suggest DVT on grayscale or color Doppler imaging. Doppler waveforms show normal direction of venous flow, normal respiratory plasticity and response to augmentation. Limited views of the contralateral common femoral vein are unremarkable. OTHER None. Limitations: none IMPRESSION: Negative. Electronically Signed   By: Anner Crete M.D.   On: 02/29/2020 22:33   DG Chest Portable 1 View  Result Date: 02/29/2020 CLINICAL DATA:  Shortness of breath. History of lung carcinoma and chronic lung disease. EXAM: PORTABLE CHEST 1 VIEW COMPARISON:  CT of the chest on 09/27/2019 FINDINGS: The heart size is at the upper limits of normal. Diffuse bronchial and interstitial prominence present likely on the basis significant chronic lung disease. There may be a component of interstitial pulmonary edema. No significant pleural effusions. No pneumothorax. IMPRESSION: Diffuse bronchial and interstitial prominence likely on the  basis of chronic lung disease. There may be a component of interstitial pulmonary edema. Electronically Signed   By: Aletta Edouard M.D.   On: 02/29/2020 13:19   ECHOCARDIOGRAM COMPLETE  Result Date: 03/01/2020    ECHOCARDIOGRAM REPORT   Patient Name:   KORYN CHARLOT Date of Exam: 03/01/2020 Medical Rec #:  [id number]     Height:       62.0 in Accession #:    [id number]    Weight:       154.3 lb Date of Birth:  [birth year]      BSA:          1.712 m Patient Age:    32 years      BP:           94/56 mmHg Patient Gender: F             HR:           87 bpm. Exam Location:  [facility] Procedure: 2D Echo Indications:     Acute Respiratory Insufficiency  518.82/ R06.89  History:         Patient has no prior history of Echocardiogram examinations.  Sonographer:     Arville Go RDCS Referring Phys:  [id number] [place] Diagnosing Phys: Bartholome Bill MD IMPRESSIONS  1. Left ventricular ejection fraction, by estimation, is 55 to 60%. The left ventricle has normal function. The left ventricle has no regional wall motion abnormalities. Left ventricular diastolic parameters were normal.  2. Right ventricular systolic function is mildly reduced. The right ventricular size is moderately enlarged. There is severely elevated pulmonary artery systolic pressure.  3. Right atrial size was mildly dilated.  4. The mitral valve is grossly normal. Moderate mitral valve regurgitation.  5. The aortic valve is tricuspid. Aortic valve regurgitation is not visualized. FINDINGS  Left Ventricle: Left ventricular ejection fraction, by estimation, is 55 to 60%. The left ventricle has normal function. The left ventricle has no regional wall motion abnormalities. The left ventricular internal cavity size was normal in size. There is  borderline left ventricular hypertrophy. Left ventricular diastolic parameters were normal. Right Ventricle: The right ventricular size is moderately enlarged. No increase in right ventricular wall thickness. Right  ventricular systolic function is mildly reduced. There is severely elevated pulmonary artery systolic pressure. The tricuspid regurgitant velocity is 3.59 m/s, and with an assumed right atrial pressure of 10 mmHg, the estimated right ventricular systolic pressure is 59.5 mmHg. Left Atrium: Left atrial size was normal in size. Right Atrium: Right atrial size was mildly dilated. Pericardium: There is no evidence of pericardial effusion. Mitral Valve: The mitral valve is grossly normal. Moderate mitral valve regurgitation. Tricuspid Valve: The tricuspid valve is grossly normal. Tricuspid valve regurgitation is mild. Aortic Valve: The aortic valve is tricuspid. Aortic valve regurgitation is not visualized. Aortic valve peak gradient measures 6.8 mmHg. Pulmonic Valve: The pulmonic valve was not well visualized. Pulmonic valve regurgitation is trivial. Aorta: The aortic root is normal in size and structure. IAS/Shunts: The interatrial septum was not assessed.  LEFT VENTRICLE PLAX 2D LVIDd:         3.71 cm  Diastology LVIDs:         2.76 cm  LV e' lateral:   6.85 cm/s LV PW:         1.24 cm  LV E/e' lateral: 10.3 LV IVS:        1.27 cm  LV e' medial:    2.83 cm/s LVOT diam:     1.90 cm  LV E/e' medial:  25.0 LV SV:         34 LV SV Index:   20 LVOT Area:     2.84 cm  RIGHT VENTRICLE RV Basal diam:  3.88 cm RV S prime:     9.90 cm/s TAPSE (M-mode): 1.5 cm LEFT ATRIUM           Index       RIGHT ATRIUM           Index LA diam:      3.60 cm 2.10 cm/m  RA Area:     18.55 cm LA Vol (A2C): 22.3 ml 13.02 ml/m RA Volume:   58.95 ml  34.43 ml/m LA Vol (A4C): 20.1 ml 11.74 ml/m  AORTIC VALVE AV Area (Vmax): 1.58 cm AV Vmax:        130.00 cm/s AV Peak Grad:   6.8 mmHg LVOT Vmax:      72.50 cm/s LVOT Vmean:     49.300 cm/s LVOT VTI:  0.121 m  AORTA Ao Root diam: 2.70 cm Ao Asc diam:  2.60 cm MITRAL VALVE               TRICUSPID VALVE MV Area (PHT): 3.42 cm    TV Peak grad:   51.6 mmHg MV Decel Time: 222 msec    TV Vmax:         3.59 m/s MV E velocity: 70.70 cm/s  TR Peak grad:   51.6 mmHg MV A velocity: 69.00 cm/s  TR Vmax:        359.00 cm/s MV E/A ratio:  1.02                            SHUNTS                            Systemic VTI:  0.12 m                            Systemic Diam: 1.90 cm Bartholome Bill MD Electronically signed by Bartholome Bill MD Signature Date/Time: 03/01/2020/11:37:40 AM    Final     Assessment & Plan    1. Acute CHF  -BNP elevated, probable pulmonary edema on chest xray  -Echo revealed good left heart funciton. Evidence of rv hypokinesis globally with elevated pulmiary pressures estimated at 60 mmHg.   2. Elevated troponin  -Likely demand ischemia in the setting of acute respiratory failure, PE -Patient denies chest pain, reports progressive symptoms over the past 2 weeks; low suspicion for ACS -Echocardiogram as per above.   3. Pulmonary Embolism  -Chest CT positive for PE with evidence of right heart strain. Continue with eliquis at 10 mg bid for 1 week.  Then 5 mg bid   Signed, Javier Docker. Neira Bentsen MD 03/02/2020, 3:24 PM  Pager: [phone]

## 2020-03-03 ENCOUNTER — Telehealth: Payer: Self-pay | Admitting: Primary Care

## 2020-03-03 ENCOUNTER — Telehealth: Payer: Self-pay

## 2020-03-03 LAB — CBC
HCT: 35 % — ABNORMAL LOW (ref 36.0–46.0)
Hemoglobin: 11.2 g/dL — ABNORMAL LOW (ref 12.0–15.0)
MCH: 27.1 pg (ref 26.0–34.0)
MCHC: 32 g/dL (ref 30.0–36.0)
MCV: 84.7 fL (ref 80.0–100.0)
Platelets: 248 10*3/uL (ref 150–400)
RBC: 4.13 MIL/uL (ref 3.87–5.11)
RDW: 16.7 % — ABNORMAL HIGH (ref 11.5–15.5)
WBC: 5.2 10*3/uL (ref 4.0–10.5)
nRBC: 0 % (ref 0.0–0.2)

## 2020-03-03 LAB — LEGIONELLA PNEUMOPHILA SEROGP 1 UR AG: L. pneumophila Serogp 1 Ur Ag: NEGATIVE

## 2020-03-03 LAB — GLUCOSE, CAPILLARY
Glucose-Capillary: 105 mg/dL — ABNORMAL HIGH (ref 70–99)
Glucose-Capillary: 130 mg/dL — ABNORMAL HIGH (ref 70–99)
Glucose-Capillary: 173 mg/dL — ABNORMAL HIGH (ref 70–99)

## 2020-03-03 MED ORDER — LEVOFLOXACIN 750 MG PO TABS
ORAL_TABLET | ORAL | 0 refills | Status: AC
Start: 2020-03-04 — End: 2020-03-06

## 2020-03-03 MED ORDER — ENSURE ENLIVE PO LIQD: 237.0000 mL | ORAL | 0 refills | Status: DC

## 2020-03-03 MED ORDER — ALBUTEROL SULFATE HFA 108 (90 BASE) MCG/ACT IN AERS: 2.0000 | INHALATION_SPRAY | Freq: Four times a day (QID) | RESPIRATORY_TRACT | 0 refills | Status: DC | PRN

## 2020-03-03 MED ORDER — APIXABAN 5 MG PO TABS: ORAL_TABLET | ORAL | 0 refills | Status: DC

## 2020-03-03 MED ORDER — FUROSEMIDE 20 MG PO TABS: 20.0000 mg | ORAL_TABLET | Freq: Every day | ORAL | 0 refills | Status: DC

## 2020-03-03 MED ORDER — IPRATROPIUM-ALBUTEROL 0.5-2.5 (3) MG/3ML IN SOLN: 3.0000 mL | Freq: Four times a day (QID) | RESPIRATORY_TRACT | Status: DC

## 2020-03-03 MED ORDER — METFORMIN HCL 1000 MG PO TABS: 1000.0000 mg | ORAL_TABLET | Freq: Two times a day (BID) | ORAL | 0 refills | Status: DC

## 2020-03-03 NOTE — TOC Transition Note (Signed)
Transition of Care [facility]) - CM/SW Discharge Note   Patient Details  Name: Brianna Adams MRN: [id number] Date of Birth: Dec 07, 1940  [facility] CM/SW Contact:  Shelbie Ammons, RN Phone Number: 03/03/2020, 2:32 PM   Clinical Narrative:   RNCM provided patient with 30 day Eliquis card. Patient being discharged and is going home with [facility] and walker was provided by [employer].       Barriers to Discharge: Continued Medical Work up   Patient Goals and CMS Choice     Choice offered to / list presented to : Patient  Discharge Placement                       Discharge Plan and Services   Discharge Planning Services: CM Consult Post Acute Care Choice: Home Health          DME Arranged: Walker rolling DME Agency: [employer] Date DME Agency Contacted: 03/03/20 Time DME Agency Contacted: [phone] Representative spoke with at DME Agency: [place]: PT, RN [place] Agency: [place] ([place]) Date [place]: 03/03/20 Time [place]: [id number] Representative spoke with at [place]: [place] ([place]) Interventions     Readmission Risk Interventions No flowsheet data found.

## 2020-03-03 NOTE — Discharge Summary (Signed)
[facility] NAME: Brianna Adams    MR#:  [id number]  DATE OF BIRTH:  [birth year]  DATE OF ADMISSION:  03/01/79 ADMITTING PHYSICIAN: Darel Hong  DATE OF DISCHARGE: 03/03/2020  2:47 PM  PRIMARY CARE PHYSICIAN: [name], [name]    ADMISSION DIAGNOSIS:  Pulmonary embolism ([place]) [I26.99] Acute congestive heart failure, unspecified heart failure type ([place]) [I50.9] Acute pulmonary embolism with acute cor pulmonale, unspecified pulmonary embolism type ([place]) [I26.09]  DISCHARGE DIAGNOSIS:  Active Problems:   Pulmonary embolism (HCC)   Lobar pneumonia (HCC)   Acute diastolic CHF (congestive heart failure) (HCC)   Acute on chronic respiratory failure with hypoxia (HCC)   Lymphadenopathy   SECONDARY DIAGNOSIS:   Past Medical History:  Diagnosis Date  . COPD (chronic obstructive pulmonary disease) ([place])   . Diabetes ([place])   . Diverticulitis   . High cholesterol   . Lung cancer ([place])   . Pulmonary hypertension ([place])   . Uterine cancer [facility])     HOSPITAL COURSE:   1.  Acute pulmonary embolism with right heart strain.  Patient was admitted to the ICU initially and started on heparin drip.  The patient was on heparin drip for 2 days and I converted over to Eliquis yesterday.  Patient will continue Eliquis for 6 months.  Risk of bleeding explained to patient and daughter.  30-day free Eliquis card given. 2.  Lobar pneumonia on the left.  Patient was started on Levaquin and I will continue 2 more doses upon going home. 3.  Acute diastolic congestive heart failure documented by cardiology.  Blood pressure too low for other medications we will try low-dose Lasix 20 mg daily and monitor.  CHF clinic and cardiology follow-up as outpatient. 4.  Acute on chronic hypoxic respiratory failure.  The patient was admitted with respiratory distress.  The patient has acrylic nails on her fingernails and her pulse ox has been very variable during the  hospital course. I titrated her down to 2 L.  Continue Anoro Ellipta and albuterol inhaler. 5.  Type 2 diabetes mellitus with good hemoglobin A1c.  Stopped Janumet and can continue metformin alone. 6.  History of lung cancer with lymphadenopathy in the chest. Case discussed with Dr. [name] her pulmonologist and she will follow-up as outpatient.  She reviewed the CAT scan and will decide on further testing after pneumonia and pulmonary embolism is treated for a little while.  Can consider PET CT scan as outpatient. 7.  Home health set up  DISCHARGE CONDITIONS:   Satisfactory  CONSULTS OBTAINED:  Treatment Team:  Teodoro Spray, MD Alanson Puls, PT  DRUG ALLERGIES:   Allergies  Allergen Reactions  . Penicillin G Shortness Of Breath    Other reaction(s): Rash  . Ace Inhibitors Swelling  . Atorvastatin     Other reaction(s): Myalgias (Muscle Pain) Other reaction(s): Myalgias (muscle pain)  . Cortisone     Other reaction(s): Angioedema  . Hydrocortisone Other (See Comments)  . Lisinopril     Other reaction(s): Angioedema  . Meloxicam Other (See Comments)    Bloody stools  . Penicillins Hives  . Pravastatin     Other reaction(s): Chest Pain    DISCHARGE MEDICATIONS:   Allergies as of 03/03/2020      Reactions   Penicillin G Shortness Of Breath   Other reaction(s): Rash   Ace Inhibitors Swelling   Atorvastatin    Other reaction(s): Myalgias (Muscle Pain) Other reaction(s): Myalgias (muscle  pain)   Cortisone    Other reaction(s): Angioedema   Hydrocortisone Other (See Comments)   Lisinopril    Other reaction(s): Angioedema   Meloxicam Other (See Comments)   Bloody stools   Penicillins Hives   Pravastatin    Other reaction(s): Chest Pain      Medication List    STOP taking these medications   Janumet XR [phone] MG Tb24 Generic drug: SitaGLIPtin-MetFORMIN HCl     TAKE these medications   acetaminophen 325 MG tablet Commonly known as: TYLENOL Take  325-650 mg by mouth every 6 (six) hours as needed for mild pain or fever.   albuterol 108 (90 Base) MCG/ACT inhaler Commonly known as: Ventolin HFA Inhale 2 puffs into the lungs every 6 (six) hours as needed for wheezing or shortness of breath.   Anoro Ellipta 62.5-25 MCG/INH Aepb Generic drug: umeclidinium-vilanterol Inhale 1 puff into the lungs daily. What changed: Another medication with the same name was removed. Continue taking this medication, and follow the directions you see here.   apixaban 5 MG Tabs tablet Commonly known as: ELIQUIS Two tabs po twice a day for 6 more days then one tablet twice a day   calcium carbonate 1500 (600 Ca) MG Tabs tablet Commonly known as: OSCAL Take 1,200 mg by mouth daily.   CENTRUM SILVER PO Take 1 tablet by mouth daily.   Cinnamon 500 MG Tabs Take 2,000 mg by mouth daily.   feeding supplement (ENSURE ENLIVE) Liqd Take 237 mLs by mouth daily.   furosemide 20 MG tablet Commonly known as: LASIX Take 1 tablet (20 mg total) by mouth daily. Start taking on: March 04, 2020   levofloxacin 750 MG tablet Commonly known as: Levaquin One tab every other day for two more doses Start taking on: March 04, 2020   metFORMIN 1000 MG tablet Commonly known as: Glucophage Take 1 tablet (1,000 mg total) by mouth 2 (two) times daily with a meal.   omeprazole 40 MG capsule Commonly known as: PRILOSEC Take 1 capsule (40 mg total) by mouth daily.   pravastatin 10 MG tablet Commonly known as: PRAVACHOL Take 1 tablet (10 mg total) by mouth daily.   vitamin C 500 MG tablet Commonly known as: ASCORBIC ACID Take 500 mg by mouth 4 (four) times a week.            Durable Medical Equipment  (From admission, onward)         Start     Ordered   03/03/20 1142  For home use only DME Walker rolling  Once       Question Answer Comment  Walker: With 5 Inch Wheels   Patient needs a walker to treat with the following condition Unsteady gait       03/03/20 1141           DISCHARGE INSTRUCTIONS:   Follow-up PMD 5 days Follow-up Dr. [name] 2 weeks Follow-up Dr. Ubaldo Glassing cardiology 1 week  If you experience worsening of your admission symptoms, develop shortness of breath, life threatening emergency, suicidal or homicidal thoughts you must seek medical attention immediately by calling 911 or calling your MD immediately  if symptoms less severe.  You Must read complete instructions/literature along with all the possible adverse reactions/side effects for all the Medicines you take and that have been prescribed to you. Take any new Medicines after you have completely understood and accept all the possible adverse reactions/side effects.   Please note  You were cared for by  a hospitalist during your hospital stay. If you have any questions about your discharge medications or the care you received while you were in the hospital after you are discharged, you can call the unit and asked to speak with the hospitalist on call if the hospitalist that took care of you is not available. Once you are discharged, your primary care physician will handle any further medical issues. Please note that NO REFILLS for any discharge medications will be authorized once you are discharged, as it is imperative that you return to your primary care physician (or establish a relationship with a primary care physician if you do not have one) for your aftercare needs so that they can reassess your need for medications and monitor your lab values.    Today   CHIEF COMPLAINT:   Chief Complaint  Patient presents with  . Shortness of Breath    HISTORY OF PRESENT ILLNESS:  Brianna Adams  is a 79 y.o. female coming in with shortness of breath and diagnosed with pulmonary embolism   VITAL SIGNS:  Blood pressure (!) 102/49, pulse 77, temperature 97.6 F (36.4 C), temperature source Oral, resp. rate 17, height _0  (1.575 m), weight 70 kg, SpO2 91  %.   PHYSICAL EXAMINATION:  GENERAL:  79 y.o.-year-old patient lying in the bed with no acute distress.  EYES: Pupils equal, round, reactive to light and accommodation. No scleral icterus. Extraocular muscles intact.  HEENT: Head atraumatic, normocephalic. Oropharynx and nasopharynx clear.   LUNGS: Decreased breath sounds bilaterally, no wheezing, rales,rhonchi or crepitation. No use of accessory muscles of respiration.  CARDIOVASCULAR: S1, S2 normal. No murmurs, rubs, or gallops.  ABDOMEN: Soft, non-tender, non-distended.  EXTREMITIES: No pedal edema.  NEUROLOGIC: Cranial nerves II through XII are intact. Muscle strength 5/5 in all extremities. Sensation intact. Gait not checked.  PSYCHIATRIC: The patient is alert and oriented x 3.  SKIN: No obvious rash, lesion, or ulcer.   DATA REVIEW:   CBC Recent Labs  Lab 03/03/20 0717  WBC 5.2  HGB 11.2*  HCT 35.0*  PLT 248    Chemistries  Recent Labs  Lab 03/01/20 0458  NA 136  K 3.6  CL 103  CO2 23  GLUCOSE 147*  BUN 15  CREATININE 0.95  CALCIUM 8.5*    Microbiology Results  Results for orders placed or performed during the hospital encounter of 02/29/20  SARS Coronavirus 2 by RT PCR (hospital order, performed in [facility] hospital lab) Nasopharyngeal Nasopharyngeal Swab     Status: None   Collection Time: 02/29/20 12:26 PM   Specimen: Nasopharyngeal Swab  Result Value Ref Range Status   SARS Coronavirus 2 NEGATIVE NEGATIVE Final    Comment: (NOTE) SARS-CoV-2 target nucleic acids are NOT DETECTED.  The SARS-CoV-2 RNA is generally detectable in upper and lower respiratory specimens during the acute phase of infection. The lowest concentration of SARS-CoV-2 viral copies this assay can detect is 250 copies / mL. A negative result does not preclude SARS-CoV-2 infection and should not be used as the sole basis for treatment or other patient management decisions.  A negative result may occur with improper specimen  collection / handling, submission of specimen other than nasopharyngeal swab, presence of viral mutation(s) within the areas targeted by this assay, and inadequate number of viral copies (<250 copies / mL). A negative result must be combined with clinical observations, patient history, and epidemiological information.  Fact Sheet for Patients:   StrictlyIdeas.no  Fact Sheet for Healthcare Providers:  BankingDealers.co.za  This test is not yet approved or  cleared by the [place] and has been authorized for detection and/or diagnosis of SARS-CoV-2 by FDA under an Emergency Use Authorization (EUA).  This EUA will remain in effect (meaning this test can be used) for the duration of the COVID-19 declaration under Section 564(b)(1) of the Act, 21 U.S.C. section 360bbb-3(b)(1), unless the authorization is terminated or revoked sooner.  Performed at [facility], [address] [place], [place] [address]   MRSA PCR Screening     Status: None   Collection Time: 02/29/20  6:53 PM   Specimen: Nasopharyngeal  Result Value Ref Range Status   MRSA by PCR NEGATIVE NEGATIVE Final    Comment:        The GeneXpert MRSA Assay (FDA approved for NASAL specimens only), is one component of a comprehensive MRSA colonization surveillance program. It is not intended to diagnose MRSA infection nor to guide or monitor treatment for MRSA infections. Performed at [facility], [address] [place], [place] [address]      Management plans discussed with the patient, family and they are in agreement.  CODE STATUS:     Code Status Orders  (From admission, onward)         Start     Ordered   02/29/20 1639  Full code  Continuous        02/29/20 1640        Code Status History    This patient has a current code status but no historical code status.   Advance Care Planning Activity      TOTAL TIME TAKING CARE  OF THIS PATIENT: 35 minutes.    Loletha Grayer M.D on 03/03/2020 at 5:53 PM  Between 7am to 6pm - Pager - [phone]  After 6pm go to [email or web address] - password [email or web address]  [facility]  CC: Primary care physician; [name], [employer]

## 2020-03-03 NOTE — Telephone Encounter (Signed)
Thanks for letting me know!

## 2020-03-03 NOTE — Telephone Encounter (Signed)
Thanks, Dr. Mortimer Fries saw in patient.

## 2020-03-03 NOTE — Care Management Important Message (Signed)
Important Message  Patient Details  Name: Brianna Adams MRN: [id number] Date of Birth: Sep 11, 1940   Medicare Important Message Given:  N/A - LOS <3 / Initial given by admissions     Juliann Pulse A Kinya Meine 03/03/2020, 11:12 AM

## 2020-03-03 NOTE — Discharge Instructions (Addendum)
Stop Janumet and use glucophage only (new script prescribed   Information on my medicine - ELIQUIS (apixaban)  This medication education was reviewed with me or my healthcare representative as part of my discharge preparation.    Why was Eliquis prescribed for you? Eliquis was prescribed to treat blood clots that may have been found in the veins of your legs (deep vein thrombosis) or in your lungs (pulmonary embolism) and to reduce the risk of them occurring again.  What do You need to know about Eliquis ? The starting dose is 10 mg (two 5 mg tablets) taken TWICE daily for the FIRST SEVEN (7) DAYS, then on 03/09/2020  the dose is reduced to ONE 5 mg tablet taken TWICE daily.  Eliquis may be taken with or without food.   Try to take the dose about the same time in the morning and in the evening. If you have difficulty swallowing the tablet whole please discuss with your pharmacist how to take the medication safely.  Take Eliquis exactly as prescribed and DO NOT stop taking Eliquis without talking to the doctor who prescribed the medication.  Stopping may increase your risk of developing a new blood clot.  Refill your prescription before you run out.  After discharge, you should have regular check-up appointments with your healthcare provider that is prescribing your Eliquis.    What do you do if you miss a dose? If a dose of ELIQUIS is not taken at the scheduled time, take it as soon as possible on the same day and twice-daily administration should be resumed. The dose should not be doubled to make up for a missed dose.  Important Safety Information A possible side effect of Eliquis is bleeding. You should call your healthcare provider right away if you experience any of the following: ? Bleeding from an injury or your nose that does not stop. ? Unusual colored urine (red or dark brown) or unusual colored stools (red or black). ? Unusual bruising for unknown reasons. ? A serious  fall or if you hit your head (even if there is no bleeding).  Some medicines may interact with Eliquis and might increase your risk of bleeding or clotting while on Eliquis. To help avoid this, consult your healthcare provider or pharmacist prior to using any new prescription or non-prescription medications, including herbals, vitamins, non-steroidal anti-inflammatory drugs (NSAIDs) and supplements.  This website has more information on Eliquis (apixaban): http://www.eliquis.com/eliquis/home

## 2020-03-03 NOTE — Telephone Encounter (Signed)
Patient's daughter called stating pt is in [place] hospital she has a clot in her lung and also has penumonia. She stated if you need to speak with pt directly you can call her at [phone]. She is out of the ICU. Tyler Deis

## 2020-03-03 NOTE — Telephone Encounter (Signed)
FYI Dr. Renea Ee

## 2020-03-03 NOTE — TOC Initial Note (Signed)
Transition of Care [facility]) - Initial/Assessment Note    Patient Details  Name: Brianna Adams MRN: [id number] Date of Birth: 14-Nov-1940  Transition of Care [facility]) CM/SW Contact:    Shelbie Ammons, RN Phone Number: 03/03/2020, 11:04 AM  Clinical Narrative:     RNCM assessed patient at bedside along with friend and daughter [name] by phone. Patient lives at home alone and up until this hospitalization was completely independent although she does have oxygen which she is chronically on. Discussed with patient recommendations that she have home health to follow her at home after discharge and after questions were answered both patient and family are in agreement as well as having the walker that is recommended. Patient relays that she is happy to have whichever agency accepts her insurance.  RNCM reached out to [facility] with [facility] and he will accept referral.  RNCM reached out Zach with [employer] and he will deliver rolling walker to patient's room.               Expected Discharge Plan: [place] Barriers to Discharge: Continued Medical Work up   Patient Goals and CMS Choice     Choice offered to / list presented to : Patient  Expected Discharge Plan and Services Expected Discharge Plan: [place]   Discharge Planning Services: CM Consult Post Acute Care Choice: [place] arrangements for the past 2 months: Single Family Home                 DME Arranged: Walker rolling DME Agency: [employer] Date DME Agency Contacted: 03/03/20 Time DME Agency Contacted: [phone] Representative spoke with at DME Agency: Thedore Mins HH Arranged: PT, RN [place] Agency: [place] ([place]) Date [place]: 03/03/20 Time [place]: 57 Representative spoke with at [place]: Corene Cornea  Prior Living Arrangements/Services Living arrangements for the past 2 months: [place] with:: Self Patient language and need for interpreter  reviewed:: Yes Do you feel safe going back to the place where you live?: Yes      Need for Family Participation in Patient Care: Yes (Comment) Care giver support system in place?: Yes (comment)   Criminal Activity/Legal Involvement Pertinent to Current Situation/Hospitalization: No - Comment as needed  Activities of Daily Living Home Assistive Devices/Equipment: Oxygen (at night and PRN) ADL Screening (condition at time of admission) Patient's cognitive ability adequate to safely complete daily activities?: Yes Is the patient deaf or have difficulty hearing?: No Does the patient have difficulty seeing, even when wearing glasses/contacts?: No Does the patient have difficulty concentrating, remembering, or making decisions?: No Patient able to express need for assistance with ADLs?: Yes Does the patient have difficulty dressing or bathing?: No Independently performs ADLs?: Yes (appropriate for developmental age) Does the patient have difficulty walking or climbing stairs?: No Weakness of Legs: None Weakness of Arms/Hands: None  Permission Sought/Granted                  Emotional Assessment Appearance:: Appears stated age     Orientation: : Oriented to Self, Oriented to Place, Oriented to  Time, Oriented to Situation Alcohol / Substance Use: Not Applicable Psych Involvement: No (comment)  Admission diagnosis:  Pulmonary embolism (HCC) [I26.99] Acute congestive heart failure, unspecified heart failure type ([place]) [I50.9] Acute pulmonary embolism with acute cor pulmonale, unspecified pulmonary embolism type ([place]) [I26.09] Patient Active Problem List   Diagnosis Date Noted  . Lobar pneumonia ([place])   . Acute  diastolic CHF (congestive heart failure) ([place])   . Acute on chronic respiratory failure with hypoxia ([place])   . Lymphadenopathy   . Pulmonary embolism ([place]) 02/29/2020  . Chronic respiratory failure with hypoxia ([place]) 02/18/2020  . Pulmonary nodule 02/18/2020  . GERD  (gastroesophageal reflux disease) 02/18/2020  . Pulmonary hypertension, unspecified ([place]) 10/04/2019  . Chronic obstructive pulmonary disease ([place]) 03/14/2019  . Type 2 diabetes mellitus without complication, without long-term current use of insulin ([place]) 03/14/2019   PCP:  [name], [place] Pharmacy:   [id number] [id number]: [phone]: [phone]    Social Determinants of Health (SDOH) Interventions    Readmission Risk Interventions No flowsheet data found.

## 2020-03-03 NOTE — Progress Notes (Signed)
Physical Therapy Treatment Patient Details Name: Brianna Adams MRN: [id number] DOB: Mar 29, 1941 Today's Date: 03/03/2020    History of Present Illness Terissa Haffey is an 79 year old female with a past medical history significant for oxygen dependent COPD, pulmonary hypertension, lung cancer s/p right lower lobe resection, and type 2 diabetes who presented to the ED on 02/29/20 for a 2 week history of progressive shortness of breath.  Workup in the ED has been significant for chest xray revealing possible interstitial pulmonary edema, O2 sats on 2L of O2 reported in the 60s, BNP of 1357.6, high sensitivity troponin elevated x 2 at 520 and 497 respectively, and ECG revealing sinus tachycardia with diffuse ST depressions. Chest ct showed large pulmonary embolus. Echo showed moderate to severe tr with estimated rvsp near 60 mmHg. Hemodynamicaly stable.    PT Comments    Pt in room on 3L at entry, agreeable to PT session. Pt able to perform multiple transfers to standing with UE support and no gross LOB. Pt also able to AMB ~25f without AD, but did require minGuard for safety and line management, had DOE and desaturation with AMB. Will continue to follow. Continuous sensor of SpO2 questionable for accuracy, poor correlation with portable sensor, recommended updated sensor.    Follow Up Recommendations  Home health PT     Equipment Recommendations  Rolling walker with 5" wheels    Recommendations for Other Services       Precautions / Restrictions Precautions Precautions: Fall Restrictions Weight Bearing Restrictions: No    Mobility  Bed Mobility Overal bed mobility: Modified Independent             General bed mobility comments: safety for sequencing and liines  Transfers Overall transfer level: Needs assistance Equipment used: None Transfers: Sit to/from Stand Sit to Stand: Min guard            Ambulation/Gait Ambulation/Gait assistance: Min guard Gait Distance (Feet):  66 Feet Assistive device: None Gait Pattern/deviations: Step-to pattern     General Gait Details: AMB 674fon 5L/min, temrinal SpO2 at 80% with 2 minute recovery.   Stairs             Wheelchair Mobility    Modified Rankin (Stroke Patients Only)       Balance Overall balance assessment: Modified Independent                                          Cognition Arousal/Alertness: Awake/alert Behavior During Therapy: WFL for tasks assessed/performed Overall Cognitive Status: Within Functional Limits for tasks assessed                                        Exercises      General Comments        Pertinent Vitals/Pain Pain Assessment: No/denies pain    Home Living                      Prior Function            PT Goals (current goals can now be found in the care plan section) Acute Rehab PT Goals Patient Stated Goal: to go home PT Goal Formulation: With patient Time For Goal Achievement: 03/09/20 Potential to Achieve Goals: Good Progress towards PT goals: Progressing  toward goals    Frequency    Min 2X/week      PT Plan Current plan remains appropriate    Co-evaluation              AM-PAC PT "6 Clicks" Mobility   Outcome Measure  Help needed turning from your back to your side while in a flat bed without using bedrails?: A Little Help needed moving from lying on your back to sitting on the side of a flat bed without using bedrails?: A Little Help needed moving to and from a bed to a chair (including a wheelchair)?: A Little Help needed standing up from a chair using your arms (e.g., wheelchair or bedside chair)?: A Little Help needed to walk in hospital room?: A Little Help needed climbing 3-5 steps with a railing? : A Little 6 Click Score: 18    End of Session Equipment Utilized During Treatment: Gait belt;Oxygen;Other (comment) Activity Tolerance: Patient tolerated treatment well;Patient  limited by fatigue Patient left: in chair;with call bell/phone within reach;with chair alarm set Nurse Communication: Mobility status PT Visit Diagnosis: Difficulty in walking, not elsewhere classified (R26.2)     Time: 5397-6734 PT Time Calculation (min) (ACUTE ONLY): 13 min  Charges:  $Therapeutic Exercise: 8-22 mins                     12:21 PM, 03/03/20 Etta Grandchild, PT, DPT Physical Therapist - [facility]  [phone] ([place])    [place] C 03/03/2020, 12:14 PM

## 2020-03-03 NOTE — Progress Notes (Signed)
[facility] Cardiology    SUBJECTIVE:  Ms. Pegues is an 79 year old female with a past medical history significant for oxygen dependent COPD, pulmonary hypertension, lung cancer s/p right lower lobe resection, and type 2 diabetes who presented to the ED on 02/29/20 for a 2 week history of progressive shortness of breath.  Workup in the ED was significant for chest xray revealing possible interstitial pulmonary edema, O2 sats on 2L of O2 reported in the 60s, BNP of 1357.6, high sensitivity troponin elevated x 2 at 520 and 497 respectively, and ECG revealing sinus tachycardia with diffuse ST depressions.  Chest CT was positive for a PE with evidence of right heart strain.   Today, Ms. Levert reports significant improvement in shortness of breath.  She is tolerating Eliquis well without any evidence of bleeding.  Right lower extremity swelling has improved.  She denies chest pain, palpitations, dizziness, lightheadedness, or syncopal/presyncopal episodes.    Vitals:   03/02/20 1650 03/02/20 2132 03/03/20 0106 03/03/20 0726  BP: 112/65 118/68 (!) 103/59 101/63  Pulse: 79 83 66 68  Resp: (!) _0 Temp: 98.2 F (36.8 C) 98 F (36.7 C) 97.7 F (36.5 C) 97.8 F (36.6 C)  TempSrc: Oral Oral Oral Oral  SpO2: 95% 90% 91% 94%  Weight:      Height:         Intake/Output Summary (Last 24 hours) at 03/03/2020 5329 Last data filed at 03/03/2020 0243 Gross per 24 hour  Intake 360 ml  Output 600 ml  Net -240 ml      PHYSICAL EXAM  General: Well developed, well nourished, in no acute distress HEENT:  Normocephalic and atramatic Neck:  No JVD.  Lungs: Clear bilaterally to auscultation and percussion. Heart: HRRR . Normal S1 and S2 without gallops or murmurs.  Abdomen: Bowel sounds are positive, abdomen soft and non-tender  Msk:  Back normal. Normal strength and tone for age. Extremities: No clubbing, cyanosis or edema.   Neuro: Alert and oriented X 3. Psych:  Good affect, responds  appropriately   LABS: Basic Metabolic Panel: Recent Labs    02/29/20 1304 03/01/20 0458  NA 134* 136  K 3.9 3.6  CL 103 103  CO2 19* 23  GLUCOSE 190* 147*  BUN 13 15  CREATININE 0.92 0.95  CALCIUM 8.8* 8.5*   Liver Function Tests: No results for input(s): AST, ALT, ALKPHOS, BILITOT, PROT, ALBUMIN in the last 72 hours. No results for input(s): LIPASE, AMYLASE in the last 72 hours. CBC: Recent Labs    02/29/20 1304 03/01/20 1004 03/02/20 0401 03/03/20 0717  WBC 12.6*   < > 7.6 5.2  NEUTROABS 10.8*  --   --   --   HGB 11.6*   < > 10.8* 11.2*  HCT 34.3*   < > 33.4* 35.0*  MCV 82.7   < > 84.3 84.7  PLT 227   < > 239 248   < > = values in this interval not displayed.   Cardiac Enzymes: No results for input(s): CKTOTAL, CKMB, CKMBINDEX, TROPONINI in the last 72 hours. BNP: Invalid input(s): POCBNP D-Dimer: No results for input(s): DDIMER in the last 72 hours. Hemoglobin A1C: No results for input(s): HGBA1C in the last 72 hours. Fasting Lipid Panel: No results for input(s): CHOL, HDL, LDLCALC, TRIG, CHOLHDL, LDLDIRECT in the last 72 hours. Thyroid Function Tests: No results for input(s): TSH, T4TOTAL, T3FREE, THYROIDAB in the last 72 hours.  Invalid input(s): FREET3 Anemia Panel: No  results for input(s): VITAMINB12, FOLATE, FERRITIN, TIBC, IRON, RETICCTPCT in the last 72 hours.  ECHOCARDIOGRAM COMPLETE  Result Date: 03/01/2020    ECHOCARDIOGRAM REPORT   Patient Name:   CALEYAH JR Date of Exam: 03/01/2020 Medical Rec #:  [id number]     Height:       62.0 in Accession #:    [id number]    Weight:       154.3 lb Date of Birth:  [birth year]      BSA:          1.712 m Patient Age:    58 years      BP:           94/56 mmHg Patient Gender: F             HR:           87 bpm. Exam Location:  [facility] Procedure: 2D Echo Indications:     Acute Respiratory Insufficiency 518.82/ R06.89  History:         Patient has no prior history of Echocardiogram examinations.  Sonographer:      Arville Go RDCS Referring Phys:  [id number] [place] Diagnosing Phys: Bartholome Bill MD IMPRESSIONS  1. Left ventricular ejection fraction, by estimation, is 55 to 60%. The left ventricle has normal function. The left ventricle has no regional wall motion abnormalities. Left ventricular diastolic parameters were normal.  2. Right ventricular systolic function is mildly reduced. The right ventricular size is moderately enlarged. There is severely elevated pulmonary artery systolic pressure.  3. Right atrial size was mildly dilated.  4. The mitral valve is grossly normal. Moderate mitral valve regurgitation.  5. The aortic valve is tricuspid. Aortic valve regurgitation is not visualized. FINDINGS  Left Ventricle: Left ventricular ejection fraction, by estimation, is 55 to 60%. The left ventricle has normal function. The left ventricle has no regional wall motion abnormalities. The left ventricular internal cavity size was normal in size. There is  borderline left ventricular hypertrophy. Left ventricular diastolic parameters were normal. Right Ventricle: The right ventricular size is moderately enlarged. No increase in right ventricular wall thickness. Right ventricular systolic function is mildly reduced. There is severely elevated pulmonary artery systolic pressure. The tricuspid regurgitant velocity is 3.59 m/s, and with an assumed right atrial pressure of 10 mmHg, the estimated right ventricular systolic pressure is 68.0 mmHg. Left Atrium: Left atrial size was normal in size. Right Atrium: Right atrial size was mildly dilated. Pericardium: There is no evidence of pericardial effusion. Mitral Valve: The mitral valve is grossly normal. Moderate mitral valve regurgitation. Tricuspid Valve: The tricuspid valve is grossly normal. Tricuspid valve regurgitation is mild. Aortic Valve: The aortic valve is tricuspid. Aortic valve regurgitation is not visualized. Aortic valve peak gradient measures 6.8 mmHg.  Pulmonic Valve: The pulmonic valve was not well visualized. Pulmonic valve regurgitation is trivial. Aorta: The aortic root is normal in size and structure. IAS/Shunts: The interatrial septum was not assessed.  LEFT VENTRICLE PLAX 2D LVIDd:         3.71 cm  Diastology LVIDs:         2.76 cm  LV e' lateral:   6.85 cm/s LV PW:         1.24 cm  LV E/e' lateral: 10.3 LV IVS:        1.27 cm  LV e' medial:    2.83 cm/s LVOT diam:     1.90 cm  LV E/e' medial:  25.0 LV SV:  34 LV SV Index:   20 LVOT Area:     2.84 cm  RIGHT VENTRICLE RV Basal diam:  3.88 cm RV S prime:     9.90 cm/s TAPSE (M-mode): 1.5 cm LEFT ATRIUM           Index       RIGHT ATRIUM           Index LA diam:      3.60 cm 2.10 cm/m  RA Area:     18.55 cm LA Vol (A2C): 22.3 ml 13.02 ml/m RA Volume:   58.95 ml  34.43 ml/m LA Vol (A4C): 20.1 ml 11.74 ml/m  AORTIC VALVE AV Area (Vmax): 1.58 cm AV Vmax:        130.00 cm/s AV Peak Grad:   6.8 mmHg LVOT Vmax:      72.50 cm/s LVOT Vmean:     49.300 cm/s LVOT VTI:       0.121 m  AORTA Ao Root diam: 2.70 cm Ao Asc diam:  2.60 cm MITRAL VALVE               TRICUSPID VALVE MV Area (PHT): 3.42 cm    TV Peak grad:   51.6 mmHg MV Decel Time: 222 msec    TV Vmax:        3.59 m/s MV E velocity: 70.70 cm/s  TR Peak grad:   51.6 mmHg MV A velocity: 69.00 cm/s  TR Vmax:        359.00 cm/s MV E/A ratio:  1.02                            SHUNTS                            Systemic VTI:  0.12 m                            Systemic Diam: 1.90 cm Bartholome Bill MD Electronically signed by Bartholome Bill MD Signature Date/Time: 03/01/2020/11:37:40 AM    Final      Echo: Normal LV systolic function with an EF estimated between 55-60% with mildly reduced RV systolic function.  Pulmonary pressures were severely elevated. Moderate MR, mild TR noted.   TELEMETRY: Normal sinus rhythm   ASSESSMENT AND PLAN:  Active Problems:   Pulmonary embolism (HCC)   Lobar pneumonia (HCC)   Acute diastolic CHF (congestive heart  failure) (HCC)   Acute on chronic respiratory failure with hypoxia (HCC)   Lymphadenopathy    1.  Acute pulmonary embolism   -Chest CT positive for PE with evidence of right heart strain  -Continue Eliquis 7m BID for 1 week, then transition to 557mBID   2.  Elevated troponin   -Likely demand ischemia in the setting of a PE, acute respiratory failure   -Currently chest pain free; SOB has improved   3.  Acute CHF   -Echocardiogram revealed normal LV systolic function with mild RV systolic dysfunction with global RV hypokinesis; pulmonary pressures estimated at 6067m   -No further inpatient workup recommended from a cardiac standpoint; would recommend discharge with outpatient follow up in 7-10 days with Dr. KenBartholome Bill NicDoristine Mango-C   Signing off for now.  Please call with further questions or concerns.   The history, physical exam findings, and plan of care were all  discussed with Dr. Bartholome Bill, and all decision making was made in collaboration.   Avie Arenas  PA-C 03/03/2020 8:22 AM

## 2020-03-04 ENCOUNTER — Ambulatory Visit: Payer: Medicare Other

## 2020-03-05 ENCOUNTER — Ambulatory Visit: Payer: Medicare Other

## 2020-03-06 ENCOUNTER — Telehealth: Payer: Self-pay

## 2020-03-06 NOTE — Telephone Encounter (Signed)
Transition Care Management Follow-up Telephone Call  Date of discharge and from where: [facility] 03/03/20  How have you been since you were released from the hospital? She is feeling weak and fatigued Any questions or concerns? The Dr in the hospital took her off of [place] and put her on metformin she wanted to know why he changed it Items Reviewed:  Did the pt receive and understand the discharge instructions provided?yes  Medications obtained and verified? yes  Any new allergies since your discharge? no  Dietary orders reviewed? no  Do you have support at home? yes  Other (ie: DME, Home Health, etc) yes home health  Functional Questionnaire: (I = Independent and D = Dependent)  Bathing/Dressing- D   Meal Prep- D  Eating- I  Maintaining continence- I  Transferring/Ambulation- I  Managing Meds- I   Follow up appointments reviewed:    PCP Hospital f/u appt confirmed? Yes scheduled to see [name] DNP,[name]-BC  on 03/26/20 @ [facility] f/u appt confirmed? Cardiologist maybe  Are transportation arrangements needed? NO  If their condition worsens, is the pt aware to call  their PCP or go to the ED? yes  Was the patient provided with contact information for the PCP's office or ED? yes  Was the pt encouraged to call back with questions or concerns? yes

## 2020-03-11 ENCOUNTER — Telehealth: Payer: Self-pay

## 2020-03-11 NOTE — Telephone Encounter (Signed)
Jeani Hawking from [facility] L[facility] asking for verbal consent for pt to have physical therapy Per JM that would be fine

## 2020-03-12 ENCOUNTER — Encounter: Payer: Self-pay | Admitting: Nurse Practitioner

## 2020-03-12 ENCOUNTER — Other Ambulatory Visit: Payer: Self-pay

## 2020-03-12 ENCOUNTER — Ambulatory Visit (INDEPENDENT_AMBULATORY_CARE_PROVIDER_SITE_OTHER): Payer: Medicare Other | Admitting: Nurse Practitioner

## 2020-03-12 VITALS — BP 132/70 | HR 94 | Temp 98.1°F | Ht 61.4 in | Wt 148.4 lb

## 2020-03-12 DIAGNOSIS — Z9981 Dependence on supplemental oxygen: Secondary | ICD-10-CM

## 2020-03-12 DIAGNOSIS — I2609 Other pulmonary embolism with acute cor pulmonale: Secondary | ICD-10-CM | POA: Diagnosis not present

## 2020-03-12 DIAGNOSIS — J181 Lobar pneumonia, unspecified organism: Secondary | ICD-10-CM

## 2020-03-12 NOTE — Patient Instructions (Signed)
   Go for repeat CXR at [facility] and [place] the week of 03/31/2020.

## 2020-03-12 NOTE — Progress Notes (Signed)
This visit occurred during the SARS-CoV-2 public health emergency.  Safety protocols were in place, including screening questions prior to the visit, additional usage of staff PPE, and extensive cleaning of exam room while observing appropriate contact time as indicated for disinfecting solutions.  Subjective:     Patient ID: Brianna Adams , female    DOB: 1941/04/25 , 79 y.o.   MRN: [id number]   Chief Complaint  Patient presents with  . Hospitalization Follow-up    HPI  She is here today for hospital follow up from admission on 6/25-6/28, her daughter had called the office concerned about her breathing and being drowsy.  I referred her to the ER, she was found to have an acute pulmonary embolism with right heart strain. She was admitted to the ICU and started on a heparin drip for two days and transitioned to Eliquis.  She is to remain on Eliquis for 6 months.  She was also found to have lobar pneumonia on the left, she was given levaquin.  She was also found to have Acute diastolic congestive heart failure documented by cardiology, due to herbBlood pressure being too low she was given low dose lasix 20 mg daily with monitoring. She is to follow up with outpatient cardiology.  Due to her having acrylic nails her pulse ox was difficult to get and she was on 2 l/m oxygen, she was to continue her Anora Ellipta and albuterol inhaler. She is to follow up with Pulmonology. Her diabetes is much better controlled and she was stopped off her Janumet while in the hospital and just started on metformin alone.   She does have a history of lung cancer with lymphadenopathy in the chest. She is to follow up with pulmonologist, once she is treated for her pulmonary embolism for a while she may do additional testing.    Home health has been set up. Her daughter is here with her today.     Past Medical History:  Diagnosis Date  . COPD (chronic obstructive pulmonary disease) ([place])   . Diabetes ([place])   .  Diverticulitis   . High cholesterol   . Lung cancer ([place])   . Pulmonary hypertension ([place])   . Uterine cancer ([place])      Family History  Problem Relation Age of Onset  . Hypertension Mother   . Heart attack Father   . Cancer Maternal Aunt      Current Outpatient Medications:  .  acetaminophen (TYLENOL) 325 MG tablet, Take 325-650 mg by mouth every 6 (six) hours as needed for mild pain or fever. , Disp: , Rfl:  .  albuterol (VENTOLIN HFA) 108 (90 Base) MCG/ACT inhaler, Inhale 2 puffs into the lungs every 6 (six) hours as needed for wheezing or shortness of breath., Disp: 18 g, Rfl: 0 .  apixaban (ELIQUIS) 5 MG TABS tablet, Two tabs po twice a day for 6 more days then one tablet twice a day, Disp: 72 tablet, Rfl: 0 .  calcium carbonate (OSCAL) 1500 (600 Ca) MG TABS tablet, Take 1,200 mg by mouth daily., Disp: , Rfl:  .  Cinnamon 500 MG TABS, Take 2,000 mg by mouth daily., Disp: , Rfl:  .  furosemide (LASIX) 20 MG tablet, Take 1 tablet (20 mg total) by mouth daily., Disp: 30 tablet, Rfl: 0 .  metFORMIN (GLUCOPHAGE) 1000 MG tablet, Take 1 tablet (1,000 mg total) by mouth 2 (two) times daily with a meal., Disp: 60 tablet, Rfl: 0 .  Multiple Vitamins-Minerals (CENTRUM SILVER  PO), Take 1 tablet by mouth daily. , Disp: , Rfl:  .  omeprazole (PRILOSEC) 40 MG capsule, Take 1 capsule (40 mg total) by mouth daily., Disp: 90 capsule, Rfl: 1 .  pravastatin (PRAVACHOL) 10 MG tablet, Take 1 tablet (10 mg total) by mouth daily., Disp: 30 tablet, Rfl: 0 .  umeclidinium-vilanterol (ANORO ELLIPTA) 62.5-25 MCG/INH AEPB, Inhale 1 puff into the lungs daily. , Disp: , Rfl:  .  vitamin C (ASCORBIC ACID) 500 MG tablet, Take 500 mg by mouth 4 (four) times a week. , Disp: , Rfl:  .  feeding supplement, ENSURE ENLIVE, (ENSURE ENLIVE) LIQD, Take 237 mLs by mouth daily. (Patient not taking: Reported on 03/12/2020), Disp: 7110 mL, Rfl: 0   Allergies  Allergen Reactions  . Penicillin G Shortness Of Breath    Other  reaction(s): Rash  . Ace Inhibitors Swelling  . Atorvastatin     Other reaction(s): Myalgias (Muscle Pain) Other reaction(s): Myalgias (muscle pain)  . Cortisone     Other reaction(s): Angioedema  . Hydrocortisone Other (See Comments)  . Lisinopril     Other reaction(s): Angioedema  . Meloxicam Other (See Comments)    Bloody stools  . Penicillins Hives  . Pravastatin     Other reaction(s): Chest Pain     Review of Systems  Constitutional: Negative.   Respiratory: Positive for shortness of breath (wearing oxygen).   Cardiovascular: Negative.   Neurological: Negative for dizziness and headaches.  Psychiatric/Behavioral: Negative.      Today's Vitals   03/12/20 1620  BP: 132/70  Pulse: 94  Temp: 98.1 F (36.7 C)  TempSrc: Oral  SpO2: (!) 82%  Weight: 148 lb 6.4 oz (67.3 kg)  Height: 5' 1.4" (1.56 m)  PainSc: 0-No pain   Body mass index is 27.68 kg/m.   Objective:  Physical Exam Vitals reviewed.  Constitutional:      General: She is not in acute distress.    Appearance: Normal appearance. She is obese.  Cardiovascular:     Rate and Rhythm: Normal rate and regular rhythm.     Pulses: Normal pulses.     Heart sounds: Normal heart sounds. No murmur heard.   Pulmonary:     Effort: Pulmonary effort is normal. No respiratory distress.     Breath sounds: No wheezing.  Skin:    Capillary Refill: Capillary refill takes less than 2 seconds.  Neurological:     General: No focal deficit present.     Mental Status: She is alert and oriented to person, place, and time.     Cranial Nerves: No cranial nerve deficit.  Psychiatric:        Mood and Affect: Mood normal.        Behavior: Behavior normal.        Thought Content: Thought content normal.        Judgment: Judgment normal.         Assessment And Plan:   1. Lobar pneumonia ([place])  She was treated with levaquin  She will have a repeat xray in approximately 4-6 weeks. TCM Performed. A member of the clinical  team spoke with the patient upon dischare. Discharge summary was reviewed in full detail during the visit. Meds reconciled and compared to discharge meds. Medication list is updated and reviewed with the patient.  Greater than 50% face to face time was spent in counseling an coordination of care.  All questions were answered to the satisfaction of the patient.   - DG Chest  2 View; Future  2. On supplemental oxygen therapy  She is wearing supplemental oxygen at 2 l/m  Her pulse ox in office was low however she has on acrylic nails  3. Pulmonary embolism with acute cor pulmonale, unspecified chronicity, unspecified pulmonary embolism type ([place]) She is to continue Eliquis for 6 months, so far tolerating well She is to have PT to help with endurance    [name], [name]    THE PATIENT IS ENCOURAGED TO PRACTICE SOCIAL DISTANCING DUE TO THE COVID-19 PANDEMIC.

## 2020-03-17 ENCOUNTER — Telehealth: Payer: Self-pay

## 2020-03-17 NOTE — Telephone Encounter (Signed)
Pt's daughter,Benita(DPR) is aware of date/time of covid test prior to PFT.

## 2020-03-18 ENCOUNTER — Telehealth: Payer: Self-pay | Admitting: Family

## 2020-03-18 LAB — HM DIABETES EYE EXAM

## 2020-03-18 NOTE — Telephone Encounter (Signed)
Unable to reach patient regarding her new patient CHF Clinic appointment that was made after her recent hospital discharge 6/28.

## 2020-03-19 ENCOUNTER — Other Ambulatory Visit
Admission: RE | Admit: 2020-03-19 | Discharge: 2020-03-19 | Disposition: A | Discharge status: Home / Self Care | Payer: Medicare Other | Source: Ambulatory Visit | Attending: Primary Care | Admitting: Primary Care

## 2020-03-19 ENCOUNTER — Other Ambulatory Visit: Payer: Self-pay

## 2020-03-19 DIAGNOSIS — Z20822 Contact with and (suspected) exposure to covid-19: Secondary | ICD-10-CM | POA: Diagnosis not present

## 2020-03-19 DIAGNOSIS — Z01812 Encounter for preprocedural laboratory examination: Secondary | ICD-10-CM | POA: Diagnosis present

## 2020-03-19 LAB — SARS CORONAVIRUS 2 (TAT 6-24 HRS): SARS Coronavirus 2: NEGATIVE

## 2020-03-20 ENCOUNTER — Ambulatory Visit: Payer: Medicare Other

## 2020-03-20 ENCOUNTER — Ambulatory Visit: Payer: Medicare Other | Attending: Primary Care

## 2020-03-20 DIAGNOSIS — J449 Chronic obstructive pulmonary disease, unspecified: Secondary | ICD-10-CM | POA: Diagnosis not present

## 2020-03-20 MED ORDER — ALBUTEROL SULFATE (2.5 MG/3ML) 0.083% IN NEBU
2.5000 mg | INHALATION_SOLUTION | Freq: Once | RESPIRATORY_TRACT | Status: AC
  Administered 2020-03-20: 2.5 mg via RESPIRATORY_TRACT
  Filled 2020-03-20: qty 3

## 2020-03-20 NOTE — Progress Notes (Signed)
Patient has appointment with Dr. [name] in [place] on 8 4, will discuss pulmonary function results at that overview

## 2020-03-26 ENCOUNTER — Inpatient Hospital Stay: Payer: Medicare Other | Admitting: Nurse Practitioner

## 2020-03-28 ENCOUNTER — Other Ambulatory Visit: Payer: Self-pay | Admitting: Nurse Practitioner

## 2020-04-01 ENCOUNTER — Other Ambulatory Visit: Payer: Self-pay

## 2020-04-01 ENCOUNTER — Ambulatory Visit
Admission: RE | Admit: 2020-04-01 | Discharge: 2020-04-01 | Disposition: A | Discharge status: Home / Self Care | Payer: Medicare Other | Source: Ambulatory Visit | Attending: Nurse Practitioner | Admitting: Nurse Practitioner

## 2020-04-01 DIAGNOSIS — J181 Lobar pneumonia, unspecified organism: Secondary | ICD-10-CM

## 2020-04-02 ENCOUNTER — Telehealth: Payer: Self-pay

## 2020-04-02 NOTE — Telephone Encounter (Signed)
Brianna Adams called for Brianna Adams regarding her recommendation on whether she should get eye surgery or injections and she wanted to know Brianna Adams's opinion on it and Brianna Adams said she appreciates the pt wanting her opinion but she is unsure of that since that isnt her specialty  Spoke w/Brianna Adams she stated ok thank you

## 2020-04-02 NOTE — Progress Notes (Signed)
Patient ID: Brianna Adams, female    DOB: [birth year], 79 y.o.   MRN: [id number]  HPI  Ms Villers is a 79 y/o female with a history of lung cancer, DM, hyperlipidemia, COPD, PE, pulmonary HTN, previous tobacco use and chronic heart failure.   Echo report from 03/01/20 reviewed and showed an EF of 55-60% along with moderate MR and severely elevated PA pressure.   Admitted 02/29/20 due to acute on chronic HF along with PE. Cardiology consult obtained. Initially placed on heparin drip with conversion over to eliquis. Antibiotics started due to pneumonia. Discharged after 3 days.   She presents today for her initial visit with a chief complaint of minimal fatigue upon moderate exertion. She describes this as chronic in nature having been present for several years. She has associated cough and shortness of breath along with this. She denies any difficulty sleeping, abdominal distention, palpitations, pedal edema, chest pain, dizziness or weight gain.   Past Medical History:  Diagnosis Date  . CHF (congestive heart failure) ([place])   . COPD (chronic obstructive pulmonary disease) ([place])   . Diabetes ([place])   . Diverticulitis   . High cholesterol   . Lung cancer ([place])   . Pulmonary hypertension ([place])   . Uterine cancer [facility])    Past Surgical History:  Procedure Laterality Date  . ABDOMINAL HYSTERECTOMY    . cataract surgery  2012 and 2017  . FIBEROPTIC BRONCHOSCOPY    . resection of lung cancer  2018  . right lower lobe non-anatomocal lung resection wedge    . right thorascoscopy     Family History  Problem Relation Age of Onset  . Hypertension Mother   . Heart attack Father   . Cancer Maternal Aunt    Social History   Tobacco Use  . Smoking status: Former Smoker    Packs/day: 1.00    Years: 58.00    Pack years: 58.00    Types: Cigarettes    Quit date: 10/22/2015    Years since quitting: 4.4  . Smokeless tobacco: Never Used  Substance Use Topics  . Alcohol use: Never   Allergies   Allergen Reactions  . Penicillin G Shortness Of Breath    Other reaction(s): Rash  . Ace Inhibitors Swelling  . Atorvastatin     Other reaction(s): Myalgias (Muscle Pain) Other reaction(s): Myalgias (muscle pain)  . Cortisone     Other reaction(s): Angioedema  . Hydrocortisone Other (See Comments)  . Lisinopril     Other reaction(s): Angioedema  . Meloxicam Other (See Comments)    Bloody stools  . Penicillins Hives  . Pravastatin     Other reaction(s): Chest Pain   Prior to Admission medications   Medication Sig Start Date End Date Taking? Authorizing Provider  acetaminophen (TYLENOL) 325 MG tablet Take 325-650 mg by mouth every 6 (six) hours as needed for mild pain or fever.  01/30/17  Yes [provider]  albuterol (VENTOLIN HFA) 108 (90 Base) MCG/ACT inhaler Inhale 2 puffs into the lungs every 6 (six) hours as needed for wheezing or shortness of breath. 03/03/20  Yes Wieting, Richard, MD  calcium carbonate (OSCAL) 1500 (600 Ca) MG TABS tablet Take 1,200 mg by mouth daily.   Yes [provider]  Cinnamon 500 MG TABS Take 2,000 mg by mouth daily.   Yes [provider]  ELIQUIS 5 MG TABS tablet TWO TABS TWICE A DAY FOR 6 MORE DAYS THEN ONE TABLET TWICE A DAY 03/31/20  Yes Laurance Flatten,  Doreene Burke, [name]  feeding supplement, ENSURE ENLIVE, (ENSURE ENLIVE) LIQD Take 237 mLs by mouth daily. 03/03/20  Yes Wieting, Richard, MD  furosemide (LASIX) 20 MG tablet TAKE 1 TABLET BY MOUTH EVERY DAY 03/31/20  Yes [name], [name]  metFORMIN (GLUCOPHAGE) 1000 MG tablet Take 1 tablet (1,000 mg total) by mouth 2 (two) times daily with a meal. 03/03/20 03/03/21 Yes Wieting, Richard, MD  Multiple Vitamins-Minerals (CENTRUM SILVER PO) Take 1 tablet by mouth daily.    Yes [provider]  omeprazole (PRILOSEC) 40 MG capsule Take 1 capsule (40 mg total) by mouth daily. 01/07/20  Yes [name], [name]  pravastatin (PRAVACHOL) 10 MG tablet Take 1 tablet (10 mg total) by mouth daily. 06/14/19   Yes [name], [name]  umeclidinium-vilanterol (ANORO ELLIPTA) 62.5-25 MCG/INH AEPB Inhale 1 puff into the lungs daily.    Yes [provider]  vitamin C (ASCORBIC ACID) 500 MG tablet Take 500 mg by mouth 4 (four) times a week.    Yes [provider]    Review of Systems  Constitutional: Positive for fatigue (minimal). Negative for appetite change.  HENT: Negative for congestion, postnasal drip and sore throat.   Eyes: Negative.   Respiratory: Positive for cough (after eating) and shortness of breath. Negative for chest tightness.   Cardiovascular: Negative for chest pain, palpitations and leg swelling.  Gastrointestinal: Negative for abdominal distention and abdominal pain.  Endocrine: Negative.   Genitourinary: Negative.   Musculoskeletal: Negative for back pain and neck pain.  Skin: Negative.   Allergic/Immunologic: Negative.   Neurological: Negative for dizziness and light-headedness.  Hematological: Negative for adenopathy. Does not bruise/bleed easily.  Psychiatric/Behavioral: Negative for dysphoric mood and sleep disturbance. The patient is not nervous/anxious.     Vitals:   04/03/20 1419  BP: 127/73  Pulse: 100  Resp: 16  SpO2: 90%  Weight: 147 lb (66.7 kg)  Height: _0  (1.575 m)   Wt Readings from Last 3 Encounters:  04/03/20 147 lb (66.7 kg)  03/12/20 148 lb 6.4 oz (67.3 kg)  03/01/20 154 lb 5.2 oz (70 kg)   Lab Results  Component Value Date   CREATININE 0.95 03/01/2020   CREATININE 0.92 02/29/2020   CREATININE 0.94 01/07/2020    Physical Exam Vitals and nursing note reviewed.  Constitutional:      Appearance: Normal appearance.  HENT:     Head: Normocephalic and atraumatic.  Cardiovascular:     Rate and Rhythm: Normal rate and regular rhythm.  Pulmonary:     Effort: Pulmonary effort is normal. No respiratory distress.     Breath sounds: No wheezing or rales.  Abdominal:     General: Abdomen is flat. There is no distension.      Palpations: Abdomen is soft.  Musculoskeletal:        General: No tenderness.     Cervical back: Normal range of motion and neck supple.     Right lower leg: No edema.     Left lower leg: No edema.  Skin:    General: Skin is warm and dry.  Neurological:     General: No focal deficit present.     Mental Status: She is alert and oriented to person, place, and time.  Psychiatric:        Mood and Affect: Mood normal.        Behavior: Behavior normal.        Thought Content: Thought content normal.    Assessment & Plan:  1: Chronic  heart failure with preserved ejection fraction without structural changes- - NYHA class II - euvolemic today - not weighing daily but daughter does have scales; instructed to weigh daily and call for an overnight weight gain of >2 pounds or a weekly weight gain of >5 pounds - not adding salt to her food; trying to closely follow a low sodium diet; written dietary information and low sodium cookbook were given to her - saw cardiology [name]) during June admission - BNP 03/01/20 was 1093.5 - reports receiving both COVID vaccines  2: DM- - saw PCP Laurance Flatten) 03/12/20 - A1c on 01/07/20 was 5.8%; doesn't check glucose at home  3: Pulmonary HTN-  - saw pulmonology Volanda Napoleon) 02/18/20 - BMP 03/01/20 reviewed and showed sodium 136, potassium 3.6, creatinine 0.95 and GFR >60 - wearing oxygen at 3L around the clock   Medication bottles were reviewed.   Due to HF stability, will not make a return appointment for patient at this time. Advised patient that she could call back at anytime to schedule another appointment and patient was comfortable with this.

## 2020-04-03 ENCOUNTER — Ambulatory Visit: Payer: Medicare Other | Attending: Family | Admitting: Family

## 2020-04-03 ENCOUNTER — Other Ambulatory Visit: Payer: Self-pay

## 2020-04-03 ENCOUNTER — Encounter: Payer: Self-pay | Admitting: Family

## 2020-04-03 VITALS — BP 127/73 | HR 100 | Resp 16 | Ht 62.0 in | Wt 147.0 lb

## 2020-04-03 DIAGNOSIS — Z85118 Personal history of other malignant neoplasm of bronchus and lung: Secondary | ICD-10-CM | POA: Diagnosis not present

## 2020-04-03 DIAGNOSIS — Z87891 Personal history of nicotine dependence: Secondary | ICD-10-CM | POA: Diagnosis not present

## 2020-04-03 DIAGNOSIS — I272 Pulmonary hypertension, unspecified: Secondary | ICD-10-CM

## 2020-04-03 DIAGNOSIS — E7800 Pure hypercholesterolemia, unspecified: Secondary | ICD-10-CM | POA: Insufficient documentation

## 2020-04-03 DIAGNOSIS — Z888 Allergy status to other drugs, medicaments and biological substances status: Secondary | ICD-10-CM | POA: Insufficient documentation

## 2020-04-03 DIAGNOSIS — Z7984 Long term (current) use of oral hypoglycemic drugs: Secondary | ICD-10-CM | POA: Insufficient documentation

## 2020-04-03 DIAGNOSIS — Z79899 Other long term (current) drug therapy: Secondary | ICD-10-CM | POA: Insufficient documentation

## 2020-04-03 DIAGNOSIS — J449 Chronic obstructive pulmonary disease, unspecified: Secondary | ICD-10-CM | POA: Diagnosis not present

## 2020-04-03 DIAGNOSIS — Z8249 Family history of ischemic heart disease and other diseases of the circulatory system: Secondary | ICD-10-CM | POA: Insufficient documentation

## 2020-04-03 DIAGNOSIS — E785 Hyperlipidemia, unspecified: Secondary | ICD-10-CM | POA: Diagnosis not present

## 2020-04-03 DIAGNOSIS — Z7901 Long term (current) use of anticoagulants: Secondary | ICD-10-CM | POA: Insufficient documentation

## 2020-04-03 DIAGNOSIS — Z86711 Personal history of pulmonary embolism: Secondary | ICD-10-CM | POA: Insufficient documentation

## 2020-04-03 DIAGNOSIS — Z8542 Personal history of malignant neoplasm of other parts of uterus: Secondary | ICD-10-CM | POA: Diagnosis not present

## 2020-04-03 DIAGNOSIS — I5032 Chronic diastolic (congestive) heart failure: Secondary | ICD-10-CM

## 2020-04-03 DIAGNOSIS — E119 Type 2 diabetes mellitus without complications: Secondary | ICD-10-CM

## 2020-04-03 DIAGNOSIS — Z880 Allergy status to penicillin: Secondary | ICD-10-CM | POA: Insufficient documentation

## 2020-04-03 NOTE — Patient Instructions (Addendum)
Begin weighing daily and call for an overnight weight gain of > 2 pounds or a weekly weight gain of >5 pounds.   Call us in the future if you'd like to schedule another appointment.

## 2020-04-09 ENCOUNTER — Encounter: Payer: Self-pay | Admitting: Pulmonary Disease

## 2020-04-09 ENCOUNTER — Other Ambulatory Visit: Payer: Self-pay

## 2020-04-09 ENCOUNTER — Ambulatory Visit (INDEPENDENT_AMBULATORY_CARE_PROVIDER_SITE_OTHER): Payer: Medicare Other | Admitting: Pulmonary Disease

## 2020-04-09 VITALS — BP 116/62 | HR 88 | Temp 98.0°F | Ht 62.0 in | Wt 142.6 lb

## 2020-04-09 DIAGNOSIS — I2609 Other pulmonary embolism with acute cor pulmonale: Secondary | ICD-10-CM | POA: Diagnosis not present

## 2020-04-09 DIAGNOSIS — J449 Chronic obstructive pulmonary disease, unspecified: Secondary | ICD-10-CM

## 2020-04-09 DIAGNOSIS — R0602 Shortness of breath: Secondary | ICD-10-CM

## 2020-04-09 DIAGNOSIS — I272 Pulmonary hypertension, unspecified: Secondary | ICD-10-CM

## 2020-04-09 DIAGNOSIS — J9611 Chronic respiratory failure with hypoxia: Secondary | ICD-10-CM

## 2020-04-09 DIAGNOSIS — R591 Generalized enlarged lymph nodes: Secondary | ICD-10-CM

## 2020-04-09 MED ORDER — TRELEGY ELLIPTA 200-62.5-25 MCG/INH IN AEPB: 1.0000 | INHALATION_SPRAY | Freq: Every day | RESPIRATORY_TRACT | 0 refills | Status: DC

## 2020-04-09 MED ORDER — BREO ELLIPTA 200-25 MCG/INH IN AEPB: 1.0000 | INHALATION_SPRAY | Freq: Every day | RESPIRATORY_TRACT | 0 refills | Status: DC

## 2020-04-09 NOTE — Patient Instructions (Signed)
Going to give you a trial of Trelegy Ellipta 1 inhalation daily.  Rinse your mouth well after use with a little bit of baking soda (sodium bicarbonate) in the water.  Call if you have any problems with the inhaler.  Call if it is actually working better for you.  This is 1 puff daily just like the Anoro was.  Do not use the Anoro while you are using the Trelegy.  You need increase flow of oxygen as well as continuous oxygen this order will be sent to [facility].  We will send another referral to pulmonary rehab.  We will see you in follow-up in 4 to 6 weeks time.

## 2020-04-09 NOTE — Progress Notes (Addendum)
Subjective:    Patient ID: Brianna Adams, female    DOB: [birth year], 79 y.o.   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  She then underwent radiation therapy to a new left upper lobe nodule that was PET positive.  Patient previously was followed here by Dr. Merton Adams.The patient most recently had been seen on 18 February 2020 of up repeat CT chest was ordered.  She had had no convincing evidence of recurrence on a CT of January 2021.  The patient also during that visit was scheduled for pulmonary rehabilitation.  However the patient was admitted on 25 June to [facility] with increasing shortness of breath.  She had had a recent trip to [place] and had noted some lower extremity pain upon returning.  Evaluation of the emergency room revealed the patient had pulmonary emboli and right heart strain.  She was noted also to require 6 L of oxygen.  Aside from pulmonary embolism it was also suspected the patient had pneumonia.  Performed on 26 June showed ejection fraction of 55 to 60%, reduced right ventricular systolic function with an large right ventricular size and severely elevated pulmonary artery systolic pressure.  She also did have moderate mitral valve regurgitation.  She has been noted to have a systolic murmur on prior physical exam.  2D echo since that time.  She has been released by cardiology who followed her transiently for elevated troponins during her admission.  Patient she also was noted to have decompensation of diastolic congestive heart failure.  Since her release from the hospital she has felt very short of breath.  She is on 2 L pulsed of oxygen which she feels is inadequate when she exerts herself.  She had pulmonary function testing performed on15 July 2021, as previous, lung volumes were invalid.  She  has persistently decreased DLCO at 49%, unchanged from prior no overt obstruction however there is significant bronchodilator response.  Testing limited by patient's inability to perform the test well.  Patient would like to be referred to pulmonary rehab.  Review of Systems A 10 point review of systems was performed and it is as noted above otherwise negative.  Allergies  Allergen Reactions  . Penicillin G Shortness Of Breath    Other reaction(s): Rash  . Ace Inhibitors Swelling  . Atorvastatin     Other reaction(s): Myalgias (Muscle Pain) Other reaction(s): Myalgias (muscle pain)  . Cortisone     Other reaction(s): Patient not sure  . Hydrocortisone Other (See Comments)  . Lisinopril     Other reaction(s): Angioedema  . Meloxicam Other (See Comments)    Bloody stools  . Penicillins Hives  . Pravastatin     Other reaction(s): Chest Pain   Current Meds  Medication Sig  . acetaminophen (TYLENOL) 325 MG tablet Take 325-650 mg by mouth every 6 (six) hours as needed for mild pain or fever.   Marland Kitchen albuterol (VENTOLIN HFA) 108 (90 Base) MCG/ACT inhaler Inhale 2 puffs into the lungs every 6 (six) hours as needed for wheezing or shortness of breath.  . calcium carbonate (OSCAL) 1500 (600 Ca) MG TABS tablet Take 1,200 mg by mouth daily.  . Cinnamon 500 MG TABS Take 2,000 mg by mouth daily.  Marland Kitchen ELIQUIS 5 MG TABS tablet TWO TABS TWICE A DAY FOR 6  MORE DAYS THEN ONE TABLET TWICE A DAY  . furosemide (LASIX) 20 MG tablet TAKE 1 TABLET BY MOUTH EVERY DAY  . metFORMIN (GLUCOPHAGE) 1000 MG tablet Take 1 tablet (1,000 mg total) by mouth 2 (two) times daily with a meal.  . Multiple Vitamins-Minerals (CENTRUM SILVER PO) Take 1 tablet by mouth daily.   Marland Kitchen omeprazole (PRILOSEC) 40 MG capsule Take 1 capsule (40 mg total) by mouth daily.  . pravastatin (PRAVACHOL) 10 MG tablet Take 1 tablet (10 mg total) by mouth daily.  Marland Kitchen umeclidinium-vilanterol (ANORO ELLIPTA) 62.5-25 MCG/INH AEPB Inhale 1 puff into the  lungs daily.   . vitamin C (ASCORBIC ACID) 500 MG tablet Take 500 mg by mouth 4 (four) times a week.   . [DISCONTINUED] feeding supplement, ENSURE ENLIVE, (ENSURE ENLIVE) LIQD Take 237 mLs by mouth daily.   Immunization History  Administered Date(s) Administered  . Fluad Quad(high Dose 65+) 05/24/2019  . Influenza-Unspecified 06/06/2018  . PFIZER SARS-COV-2 Vaccination 10/20/2019, 11/19/2019  . Pneumococcal Conjugate-13 10/31/2013  . Pneumococcal Polysaccharide-23 11/05/2007  . Td 09/07/2003  . Tdap 08/17/2017  . Zoster 11/05/2010  . Zoster Recombinat (Shingrix) 08/17/2017, 06/28/2019, 09/05/2019       Objective:   Physical Exam BP 116/62 (BP Location: Left Arm, Cuff Size: Normal)   Pulse 88   Temp 98 F (36.7 C) (Temporal)   Ht _0  (1.575 m)   Wt 142 lb 9.6 oz (64.7 kg)   SpO2 92%   BMI 26.08 kg/m Patient on 2 L/min O2 pulsed. GENERAL: Well-developed well-nourished elderly woman no acute distress, she does have mild tachypnea, she presents in transport chair. HEAD: Normocephalic, atraumatic.  EYES: Pupils equal, round, reactive to light.  No scleral icterus.  MOUTH: Nose/mouth/throat not examined due to masking requirements for COVID 19. NECK: Supple. No thyromegaly. Trachea midline. No JVD.  No adenopathy. PULMONARY: Lungs clear to auscultation bilaterally. CARDIOVASCULAR: S1 and S2. Regular rate and rhythm.  There is a grade 2/6 systolic ejection murmur left sternal Adams. GASTROINTESTINAL: Benign. MUSCULOSKELETAL: No joint deformity, no clubbing, no edema.  NEUROLOGIC: No focal deficit.  Speech is fluent.  SKIN: Intact,warm,dry.  Limited exam no rashes PSYCH: Mood and behavior appropriate.  Ambulatory oximetry was performed today: On 2 L/min pulsed the patient could not maintain adequate oxygen saturation during ambulation.  Patient required 3 L/min continuous supplementation.  She stated that she felt markedly better with this.    Assessment & Plan:     ICD-10-CM    1. SOB (shortness of breath)  R06.02 ECHOCARDIOGRAM COMPLETE   Multifactorial Pulmonary hypertension Recent PE with right heart strain Diastolic dysfunction  2. Pulmonary embolism with acute cor pulmonale, unspecified chronicity, unspecified pulmonary embolism type (HCC)  I26.09    Recheck 2D echo  3. Pulmonary hypertension (HCC)  I27.20    Reassess with 2D echo  4. Chronic respiratory failure with hypoxia (HCC)  J96.11    Adjust O2 to 3 L/min continuous as per ambulatory oximetry  5. COPD with chronic bronchitis and emphysema ([place])  J44.9 AMB referral to pulmonary rehabilitation    AMB REFERRAL FOR DME   Mostly emphysema PFTs not valid due to patient's poor performance Trial of Trelegy as does have reversible airways component  6. Lymphadenopathy  R59.1    This was noted on recent CT    Meds ordered this encounter  Medications  . DISCONTD: fluticasone furoate-vilanterol (BREO ELLIPTA) 200-25 MCG/INH AEPB    Sig: Inhale 1 puff into the lungs daily for 1 day.  Dispense:  14 each    Refill:  0  . Fluticasone-Umeclidin-Vilant (TRELEGY ELLIPTA) 200-62.5-25 MCG/INH AEPB    Sig: Inhale 1 puff into the lungs daily.    Dispense:  14 each    Refill:  0   Orders Placed This Encounter  Procedures  . AMB referral to pulmonary rehabilitation    Referral Priority:   Routine    Referral Type:   Consultation    Number of Visits Requested:   1  . AMB REFERRAL FOR DME    Referral Priority:   Routine    Referral Type:   Durable Medical Equipment Purchase    Number of Visits Requested:   1  . ECHOCARDIOGRAM COMPLETE    Standing Status:   Future    Standing Expiration Date:   10/10/2020    Order Specific Question:   Where should this test be performed    Answer:   [facility]    Order Specific Question:   Please indicate who you request to read the echo results.    Answer:   [facility]    Order Specific Question:   Perflutren DEFINITY (image enhancing agent) should be  administered unless hypersensitivity or allergy exist    Answer:   Administer Perflutren    Order Specific Question:   Reason for exam-Echo    Answer:   Dyspnea  786.09 / R06.00    Order Specific Question:   Other Comments    Answer:   HEART FAILURE.   Discussion:  Patient had PE in June.  She is currently on anticoagulation with Eliquis.  She will likely need lifelong anticoagulation given her underlying history of squamous cell carcinoma.  She had evidence of acute cor pulmonale after PE and will need reevaluation with 2D echo.  PFTs were difficult to interpret due to the patient's poor performance.  She did have reversible airways component.  She would benefit from inhaled corticosteroids.  There is a history of "allergy" to cortisone.  I have asked the patient about this and she states that she does not recall having hives or angioedema with this medication.  She is unsure as to the type of reaction.  She was advised however if she has difficulties with inhaled corticosteroids to alert [place] immediately.  She will be given a trial of Trelegy Ellipta.  If this is not effective will continue with Anoro Ellipta.  Her oxygen supplementation should be changed to 3 L/min continuous.    Lymphadenopathy also noted on recent CT.  However, the patient had acute PE, decompensation of diastolic heart failure and right heart strain as well as pneumonia.  Will repeat CT scan of the chest  We will see her in follow-up in 4 to 6 weeks time she is to contact us prior to that time should any new difficulties arise.  Renold Don, MD [place] [facility] [facility]    *This note was dictated using voice recognition software/Dragon.  Despite best efforts to proofread, errors can occur which can change the meaning.  Any change was purely unintentional.   **Addendum: Spoke with the patient's daughter this afternoon 1746 hrs. given her severe pulmonary hypertension (on further review of her 2D echo) we will hold off on Trelegy  Ellipta for now and reassess her after 2D echo is completed.  She is to continue Cisco inhalation daily. Renold Don, MD [place] [facility]

## 2020-04-12 ENCOUNTER — Other Ambulatory Visit: Payer: Self-pay | Admitting: Nurse Practitioner

## 2020-04-16 ENCOUNTER — Other Ambulatory Visit: Payer: Self-pay

## 2020-04-16 MED ORDER — METFORMIN HCL 1000 MG PO TABS: 1000.0000 mg | ORAL_TABLET | Freq: Two times a day (BID) | ORAL | 0 refills | Status: DC

## 2020-04-19 ENCOUNTER — Other Ambulatory Visit: Payer: Self-pay | Admitting: Nurse Practitioner

## 2020-04-19 DIAGNOSIS — E119 Type 2 diabetes mellitus without complications: Secondary | ICD-10-CM

## 2020-04-21 ENCOUNTER — Other Ambulatory Visit: Payer: Self-pay

## 2020-04-21 ENCOUNTER — Encounter: Payer: Medicare Other | Attending: Pulmonary Disease

## 2020-04-21 VITALS — Ht 62.75 in | Wt 146.7 lb

## 2020-04-21 DIAGNOSIS — J439 Emphysema, unspecified: Secondary | ICD-10-CM | POA: Insufficient documentation

## 2020-04-21 NOTE — Progress Notes (Signed)
Pulmonary Individual Treatment Plan  Patient Details  Name: Brianna Adams MRN: [id number] Date of Birth: 1940/11/17 Referring Provider:     Pulmonary Rehab from 04/21/2020 in [facility] Cardiac and Pulmonary Rehab  Referring Provider [name]      Initial Encounter Date:    Pulmonary Rehab from 04/21/2020 in [facility] Cardiac and Pulmonary Rehab  Date 04/21/20      Visit Diagnosis: Pulmonary emphysema, unspecified emphysema type ([place])  Patient's Home Medications on Admission:  Current Outpatient Medications:  .  acetaminophen (TYLENOL) 325 MG tablet, Take 325-650 mg by mouth every 6 (six) hours as needed for mild pain or fever. , Disp: , Rfl:  .  albuterol (VENTOLIN HFA) 108 (90 Base) MCG/ACT inhaler, Inhale 2 puffs into the lungs every 6 (six) hours as needed for wheezing or shortness of breath., Disp: 18 g, Rfl: 0 .  calcium carbonate (OSCAL) 1500 (600 Ca) MG TABS tablet, Take 1,200 mg by mouth daily., Disp: , Rfl:  .  Cinnamon 500 MG TABS, Take 2,000 mg by mouth daily., Disp: , Rfl:  .  ELIQUIS 5 MG TABS tablet, TWO TABS TWICE A DAY FOR 6 MORE DAYS THEN ONE TABLET TWICE A DAY, Disp: 72 tablet, Rfl: 0 .  Fluticasone-Umeclidin-Vilant (TRELEGY ELLIPTA) 200-62.5-25 MCG/INH AEPB, Inhale 1 puff into the lungs daily., Disp: 14 each, Rfl: 0 .  furosemide (LASIX) 20 MG tablet, TAKE 1 TABLET BY MOUTH EVERY DAY, Disp: 90 tablet, Rfl: 2 .  metFORMIN (GLUCOPHAGE) 1000 MG tablet, Take 1 tablet (1,000 mg total) by mouth 2 (two) times daily with a meal., Disp: 180 tablet, Rfl: 0 .  Multiple Vitamins-Minerals (CENTRUM SILVER PO), Take 1 tablet by mouth daily. , Disp: , Rfl:  .  omeprazole (PRILOSEC) 40 MG capsule, Take 1 capsule (40 mg total) by mouth daily., Disp: 90 capsule, Rfl: 1 .  pravastatin (PRAVACHOL) 10 MG tablet, TAKE 1 TABLET BY MOUTH EVERY DAY, Disp: 90 tablet, Rfl: 1 .  umeclidinium-vilanterol (ANORO ELLIPTA) 62.5-25 MCG/INH AEPB, Inhale 1 puff into the lungs daily. , Disp: , Rfl:  .  vitamin C  (ASCORBIC ACID) 500 MG tablet, Take 500 mg by mouth 4 (four) times a week. , Disp: , Rfl:   Past Medical History: Past Medical History:  Diagnosis Date  . CHF (congestive heart failure) ([place])   . COPD (chronic obstructive pulmonary disease) ([place])   . Diabetes ([place])   . Diverticulitis   . High cholesterol   . Lung cancer ([place])   . Pulmonary hypertension ([place])   . Uterine cancer ([place])     Tobacco Use: Social History   Tobacco Use  Smoking Status Former Smoker  . Packs/day: 1.00  . Years: 58.00  . Pack years: 58.00  . Types: Cigarettes  . Quit date: 10/22/2015  . Years since quitting: 4.5  Smokeless Tobacco Never Used    Labs: Recent Review Flowsheet Data    Labs for ITP Cardiac and Pulmonary Rehab Latest Ref Rng & Units 06/14/2019 10/04/2019 01/07/2020 02/29/2020   Cholestrol 100 - 199 mg/dL - 152 149 -   LDLCALC 0 - 99 mg/dL - 80 73 -   HDL >39 mg/dL - 57 61 -   Trlycerides 0 - 149 mg/dL - 76 80 -   Hemoglobin A1c 4.8 - 5.6 % 6.3(H) 6.2(H) 5.8(H) -   HCO3 20.0 - 28.0 mmol/L - - - 19.0(L)   ACIDBASEDEF 0.0 - 2.0 mmol/L - - - 4.0(H)   O2SAT % - - - 99.6  Pulmonary Assessment Scores:   UCSD: Self-administered rating of dyspnea associated with activities of daily living (ADLs) 6-point scale (0 = "not at all" to 5 = "maximal or unable to do because of breathlessness")  Scoring Scores range from 0 to 120.  Minimally important difference is 5 units  CAT: CAT can identify the health impairment of COPD patients and is better correlated with disease progression.  CAT has a scoring range of zero to 40. The CAT score is classified into four groups of low (less than 10), medium (10 - 20), high (21-30) and very high (31-40) based on the impact level of disease on health status. A CAT score over 10 suggests significant symptoms.  A worsening CAT score could be explained by an exacerbation, poor medication adherence, poor inhaler technique, or progression of COPD or comorbid  conditions.  CAT MCID is 2 points  mMRC: mMRC (Modified Medical Research Council) Dyspnea Scale is used to assess the degree of baseline functional disability in patients of respiratory disease due to dyspnea. No minimal important difference is established. A decrease in score of 1 point or greater is considered a positive change.   Pulmonary Function Assessment:   Exercise Target Goals: Exercise Program Goal: Individual exercise prescription set using results from initial 6 min walk test and THRR while considering  patient's activity barriers and safety.   Exercise Prescription Goal: Initial exercise prescription builds to 30-45 minutes a day of aerobic activity, 2-3 days per week.  Home exercise guidelines will be given to patient during program as part of exercise prescription that the participant will acknowledge.  Education: Aerobic Exercise & Resistance Training: - Gives group verbal and written instruction on the various components of exercise. Focuses on aerobic and resistive training programs and the benefits of this training and how to safely progress through these programs..   Education: Exercise & Equipment Safety: - Individual verbal instruction and demonstration of equipment use and safety with use of the equipment.   Pulmonary Rehab from 04/21/2020 in [facility] Cardiac and Pulmonary Rehab  Date 04/21/20  Educator AS  Instruction Review Code 1- Verbalizes Understanding      Education: Exercise Physiology & General Exercise Guidelines: - Group verbal and written instruction with models to review the exercise physiology of the cardiovascular system and associated critical values. Provides general exercise guidelines with specific guidelines to those with heart or lung disease.    Education: Flexibility, Balance, Mind/Body Relaxation: Provides group verbal/written instruction on the benefits of flexibility and balance training, including mind/body exercise modes such as yoga,  pilates and tai chi.  Demonstration and skill practice provided.   Activity Barriers & Risk Stratification:  Activity Barriers & Cardiac Risk Stratification - 02/28/20 1107      Activity Barriers & Cardiac Risk Stratification   Activity Barriers None           6 Minute Walk:  6 Minute Walk    Row Name 04/21/20 1609         6 Minute Walk   Phase Initial     Distance 380 feet     Walk Time 4.5 minutes     # of Rest Breaks 3     MPH 0.95     METS 0.8     RPE 11     Perceived Dyspnea  2     VO2 Peak 2.75     Symptoms No     Resting HR 85 bpm     Resting BP 120/60  Resting Oxygen Saturation  98 %     Exercise Oxygen Saturation  during 6 min walk 90 %     Max Ex. HR 103 bpm     Max Ex. BP 134/66     2 Minute Post BP 130/62       Interval HR   1 Minute HR 102     2 Minute HR 93     3 Minute HR 100     4 Minute HR 100     5 Minute HR 101     6 Minute HR 103     2 Minute Post HR 82     Interval Heart Rate? Yes       Interval Oxygen   Interval Oxygen? Yes     Baseline Oxygen Saturation % 98 %     1 Minute Oxygen Saturation % 92 %     1 Minute Liters of Oxygen 3 L     2 Minute Oxygen Saturation % 90 %     2 Minute Liters of Oxygen 3 L     3 Minute Oxygen Saturation % 94 %     3 Minute Liters of Oxygen 3 L     4 Minute Oxygen Saturation % 90 %     4 Minute Liters of Oxygen 3 L     5 Minute Oxygen Saturation % 93 %     5 Minute Liters of Oxygen 3 L     6 Minute Oxygen Saturation % 93 %     6 Minute Liters of Oxygen 3 L     2 Minute Post Oxygen Saturation % 98 %     2 Minute Post Liters of Oxygen 3 L           Oxygen Initial Assessment:  Oxygen Initial Assessment - 02/28/20 1111      Home Oxygen   Home Oxygen Device Portable Concentrator;Home Concentrator    Sleep Oxygen Prescription Continuous    Liters per minute 2    Home Exercise Oxygen Prescription None    Home at Rest Exercise Oxygen Prescription Continuous    Liters per minute -1     Compliance with Home Oxygen Use Yes      Intervention   Short Term Goals To learn and exhibit compliance with exercise, home and travel O2 prescription;To learn and understand importance of monitoring SPO2 with pulse oximeter and demonstrate accurate use of the pulse oximeter.;To learn and understand importance of maintaining oxygen saturations>88%;To learn and demonstrate proper pursed lip breathing techniques or other breathing techniques.;To learn and demonstrate proper use of respiratory medications    Long  Term Goals Exhibits compliance with exercise, home and travel O2 prescription;Verbalizes importance of monitoring SPO2 with pulse oximeter and return demonstration;Maintenance of O2 saturations>88%;Exhibits proper breathing techniques, such as pursed lip breathing or other method taught during program session;Compliance with respiratory medication;Demonstrates proper use of MDI's           Oxygen Re-Evaluation:   Oxygen Discharge (Final Oxygen Re-Evaluation):   Initial Exercise Prescription:  Initial Exercise Prescription - 04/21/20 1600      Date of Initial Exercise RX and Referring Provider   Date 04/21/20    Referring Provider [name]      Treadmill   MPH 0.8    Grade 0    Minutes 15    METs 1      Recumbant Bike   Level 1    RPM 60    Watts 5  Minutes 15    METs 1      NuStep   Level 1    SPM 80    Minutes 15    METs 1      Arm Ergometer   Level 1    RPM 25    Minutes 15    METs 1      Recumbant Elliptical   Level 1    RPM 30    Minutes 15    METs 1      Prescription Details   Frequency (times per week) 3    Duration Progress to 30 minutes of continuous aerobic without signs/symptoms of physical distress      Intensity   THRR 40-80% of Max Heartrate 107-129    Ratings of Perceived Exertion 11-13    Perceived Dyspnea 0-4      Resistance Training   Training Prescription Yes    Weight 2 lb    Reps 10-15           Perform Capillary  Blood Glucose checks as needed.  Exercise Prescription Changes:  Exercise Prescription Changes    Row Name 04/21/20 1600             Response to Exercise   Blood Pressure (Admit) 120/60       Blood Pressure (Exercise) 134/66       Blood Pressure (Exit) 130/62       Heart Rate (Admit) 85 bpm       Heart Rate (Exercise) 103 bpm       Heart Rate (Exit) 82 bpm       Oxygen Saturation (Admit) 98 %       Oxygen Saturation (Exercise) 90 %       Oxygen Saturation (Exit) 98 %       Rating of Perceived Exertion (Exercise) 14       Perceived Dyspnea (Exercise) 2       Symptoms none              Exercise Comments:   Exercise Goals and Review:  Exercise Goals    Row Name 04/21/20 1617             Exercise Goals   Increase Physical Activity Yes       Intervention Provide advice, education, support and counseling about physical activity/exercise needs.;Develop an individualized exercise prescription for aerobic and resistive training based on initial evaluation findings, risk stratification, comorbidities and participant's personal goals.       Expected Outcomes Short Term: Attend rehab on a regular basis to increase amount of physical activity.;Long Term: Add in home exercise to make exercise part of routine and to increase amount of physical activity.;Long Term: Exercising regularly at least 3-5 days a week.       Increase Strength and Stamina Yes       Intervention Provide advice, education, support and counseling about physical activity/exercise needs.;Develop an individualized exercise prescription for aerobic and resistive training based on initial evaluation findings, risk stratification, comorbidities and participant's personal goals.       Expected Outcomes Short Term: Increase workloads from initial exercise prescription for resistance, speed, and METs.;Short Term: Perform resistance training exercises routinely during rehab and add in resistance training at home;Long Term:  Improve cardiorespiratory fitness, muscular endurance and strength as measured by increased METs and functional capacity (6MWT)       Able to understand and use rate of perceived exertion (RPE) scale Yes  Intervention Provide education and explanation on how to use RPE scale       Expected Outcomes Short Term: Able to use RPE daily in rehab to express subjective intensity level;Long Term:  Able to use RPE to guide intensity level when exercising independently       Able to understand and use Dyspnea scale Yes       Intervention Provide education and explanation on how to use Dyspnea scale       Expected Outcomes Short Term: Able to use Dyspnea scale daily in rehab to express subjective sense of shortness of breath during exertion;Long Term: Able to use Dyspnea scale to guide intensity level when exercising independently       Knowledge and understanding of Target Heart Rate Range (THRR) Yes       Intervention Provide education and explanation of THRR including how the numbers were predicted and where they are located for reference       Expected Outcomes Short Term: Able to state/look up THRR;Short Term: Able to use daily as guideline for intensity in rehab;Long Term: Able to use THRR to govern intensity when exercising independently       Able to check pulse independently Yes       Intervention Provide education and demonstration on how to check pulse in carotid and radial arteries.;Review the importance of being able to check your own pulse for safety during independent exercise       Expected Outcomes Short Term: Able to explain why pulse checking is important during independent exercise;Long Term: Able to check pulse independently and accurately       Understanding of Exercise Prescription Yes       Intervention Provide education, explanation, and written materials on patient's individual exercise prescription       Expected Outcomes Short Term: Able to explain program exercise  prescription;Long Term: Able to explain home exercise prescription to exercise independently              Exercise Goals Re-Evaluation :   Discharge Exercise Prescription (Final Exercise Prescription Changes):  Exercise Prescription Changes - 04/21/20 1600      Response to Exercise   Blood Pressure (Admit) 120/60    Blood Pressure (Exercise) 134/66    Blood Pressure (Exit) 130/62    Heart Rate (Admit) 85 bpm    Heart Rate (Exercise) 103 bpm    Heart Rate (Exit) 82 bpm    Oxygen Saturation (Admit) 98 %    Oxygen Saturation (Exercise) 90 %    Oxygen Saturation (Exit) 98 %    Rating of Perceived Exertion (Exercise) 14    Perceived Dyspnea (Exercise) 2    Symptoms none           Nutrition:  Target Goals: Understanding of nutrition guidelines, daily intake of sodium <1590m, cholesterol <2043m calories 30% from fat and 7% or less from saturated fats, daily to have 5 or more servings of fruits and vegetables.  Education: Controlling Sodium/Reading Food Labels -Group verbal and written material supporting the discussion of sodium use in heart healthy nutrition. Review and explanation with models, verbal and written materials for utilization of the food label.   Education: General Nutrition Guidelines/Fats and Fiber: -Group instruction provided by verbal, written material, models and posters to present the general guidelines for heart healthy nutrition. Gives an explanation and review of dietary fats and fiber.   Biometrics:  Pre Biometrics - 04/21/20 1621      Pre Biometrics   Height 5'  2.75" (1.594 m)    Weight 146 lb 11.2 oz (66.5 kg)   self reported   BMI (Calculated) 26.19            Nutrition Therapy Plan and Nutrition Goals:   Nutrition Assessments:   MEDIFICTS Score Key:          ?70 Need to make dietary changes          40-70 Heart Healthy Diet         ? 40 Therapeutic Level Cholesterol Diet  Nutrition Goals Re-Evaluation:   Nutrition Goals  Discharge (Final Nutrition Goals Re-Evaluation):   Psychosocial: Target Goals: Acknowledge presence or absence of significant depression and/or stress, maximize coping skills, provide positive support system. Participant is able to verbalize types and ability to use techniques and skills needed for reducing stress and depression.   Education: Depression - Provides group verbal and written instruction on the correlation between heart/lung disease and depressed mood, treatment options, and the stigmas associated with seeking treatment.   Education: Sleep Hygiene -Provides group verbal and written instruction about how sleep can affect your health.  Define sleep hygiene, discuss sleep cycles and impact of sleep habits. Review good sleep hygiene tips.    Education: Stress and Anxiety: - Provides group verbal and written instruction about the health risks of elevated stress and causes of high stress.  Discuss the correlation between heart/lung disease and anxiety and treatment options. Review healthy ways to manage with stress and anxiety.   Initial Review & Psychosocial Screening:  Initial Psych Review & Screening - 02/28/20 1121      Initial Review   Current issues with None Identified      Family Dynamics   Good Support System? Yes   Daughter, church family     Barriers   Psychosocial barriers to participate in program There are no identifiable barriers or psychosocial needs.      Screening Interventions   Interventions Encouraged to exercise    Expected Outcomes Short Term goal: Utilizing psychosocial counselor, staff and physician to assist with identification of specific Stressors or current issues interfering with healing process. Setting desired goal for each stressor or current issue identified.;Long Term Goal: Stressors or current issues are controlled or eliminated.;Short Term goal: Identification and review with participant of any Quality of Life or Depression concerns found by  scoring the questionnaire.;Long Term goal: The participant improves quality of Life and PHQ9 Scores as seen by post scores and/or verbalization of changes           Quality of Life Scores:  Scores of 19 and below usually indicate a poorer quality of life in these areas.  A difference of  2-3 points is a clinically meaningful difference.  A difference of 2-3 points in the total score of the Quality of Life Index has been associated with significant improvement in overall quality of life, self-image, physical symptoms, and general health in studies assessing change in quality of life.  PHQ-9: Recent Review Flowsheet Data    Depression screen [facility] 2/9 10/04/2019 10/04/2019 06/14/2019 03/29/2018   Decreased Interest 0 0 0 0   Down, Depressed, Hopeless 0 0 0 0   PHQ - 2 Score 0 0 0 0   Altered sleeping 0 - - -   Tired, decreased energy 0 - - -   Change in appetite 0 - - -   Feeling bad or failure about yourself  0 - - -   Trouble concentrating 0 - - -  Moving slowly or fidgety/restless 0 - - -   Suicidal thoughts 0 - - -   PHQ-9 Score 0 - - -   Difficult doing work/chores Not difficult at all - - -     Interpretation of Total Score  Total Score Depression Severity:  1-4 = Minimal depression, 5-9 = Mild depression, 10-14 = Moderate depression, 15-19 = Moderately severe depression, 20-27 = Severe depression   Psychosocial Evaluation and Intervention:  Psychosocial Evaluation - 02/28/20 1133      Psychosocial Evaluation & Interventions   Interventions Encouraged to exercise with the program and follow exercise prescription    Comments Benzenaehibits no barriers to attending the program. She is ready to get started.  She has noticed that she is more shoort of breath than her usual. Could walk a hall before getting SOB, not it happens sooner. She lives in a home with her daughter [name]. She recently moved to [place] to live with her daughter. She wears oxygen at night and during the day when up and  about. She voiced no concerns about the program, except what machines will she use and she does not feel she will do well on the treadmill. Lummie should do well during her time here.    Expected Outcomes STG: Attends all scheduled sessions. LTG: Improvement in breathing during daily activities.    Continue Psychosocial Services  Follow up required by staff           Psychosocial Re-Evaluation:   Psychosocial Discharge (Final Psychosocial Re-Evaluation):   Education: Education Goals: Education classes will be provided on a weekly basis, covering required topics. Participant will state understanding/return demonstration of topics presented.  Learning Barriers/Preferences:  Learning Barriers/Preferences - 02/28/20 1125      Learning Barriers/Preferences   Learning Barriers None    Learning Preferences Audio;Written Material           General Pulmonary Education Topics:  Infection Prevention: - Provides verbal and written material to individual with discussion of infection control including proper hand washing and proper equipment cleaning during exercise session.   Pulmonary Rehab from 04/21/2020 in [facility] Cardiac and Pulmonary Rehab  Date 04/21/20  Educator AS  Instruction Review Code 1- Verbalizes Understanding      Falls Prevention: - Provides verbal and written material to individual with discussion of falls prevention and safety.   Pulmonary Rehab from 04/21/2020 in [facility] Cardiac and Pulmonary Rehab  Date 04/21/20  Educator AS  Instruction Review Code 1- Verbalizes Understanding      Chronic Lung Diseases: - Group verbal and written instruction to review updates, respiratory medications, advancements in procedures and treatments. Discuss use of supplemental oxygen including available portable oxygen systems, continuous and intermittent flow rates, concentrators, personal use and safety guidelines. Review proper use of inhaler and spacers. Provide informative websites  for self-education.    Energy Conservation: - Provide group verbal and written instruction for methods to conserve energy, plan and organize activities. Instruct on pacing techniques, use of adaptive equipment and posture/positioning to relieve shortness of breath.   Triggers and Exacerbations: - Group verbal and written instruction to review types of environmental triggers and ways to prevent exacerbations. Discuss weather changes, air quality and the benefits of nasal washing. Review warning signs and symptoms to help prevent infections. Discuss techniques for effective airway clearance, coughing, and vibrations.   AED/CPR: - Group verbal and written instruction with the use of models to demonstrate the basic use of the AED with the basic ABC's of resuscitation.  Anatomy and Physiology of the Lungs: - Group verbal and written instruction with the use of models to provide basic lung anatomy and physiology related to function, structure and complications of lung disease.   Anatomy & Physiology of the Heart: - Group verbal and written instruction and models provide basic cardiac anatomy and physiology, with the coronary electrical and arterial systems. Review of Valvular disease and Heart Failure   Cardiac Medications: - Group verbal and written instruction to review commonly prescribed medications for heart disease. Reviews the medication, class of the drug, and side effects.   Other: -Provides group and verbal instruction on various topics (see comments)   Knowledge Questionnaire Score:    Core Components/Risk Factors/Patient Goals at Admission:  Personal Goals and Risk Factors at Admission - 04/21/20 1623      Core Components/Risk Factors/Patient Goals on Admission    Weight Management Yes    Intervention Weight Management: Develop a combined nutrition and exercise program designed to reach desired caloric intake, while maintaining appropriate intake of nutrient and fiber,  sodium and fats, and appropriate energy expenditure required for the weight goal.;Weight Management: Provide education and appropriate resources to help participant work on and attain dietary goals.    Admit Weight 146 lb 11.2 oz (66.5 kg)    Goal Weight: Short Term 146 lb 11.2 oz (66.5 kg)    Goal Weight: Long Term 146 lb 11.2 oz (66.5 kg)    Expected Outcomes Short Term: Continue to assess and modify interventions until short term weight is achieved;Long Term: Adherence to nutrition and physical activity/exercise program aimed toward attainment of established weight goal;Weight Maintenance: Understanding of the daily nutrition guidelines, which includes 25-35% calories from fat, 7% or less cal from saturated fats, less than 270m cholesterol, less than 1.5gm of sodium, & 5 or more servings of fruits and vegetables daily           Education:Diabetes - Individual verbal and written instruction to review signs/symptoms of diabetes, desired ranges of glucose level fasting, after meals and with exercise. Acknowledge that pre and post exercise glucose checks will be done for 3 sessions at entry of program.   Education: Know Your Numbers and Risk Factors: -Group verbal and written instruction about important numbers in your health.  Discussion of what are risk factors and how they play a role in the disease process.  Review of Cholesterol, Blood Pressure, Diabetes, and BMI and the role they play in your overall health.   Core Components/Risk Factors/Patient Goals Review:    Core Components/Risk Factors/Patient Goals at Discharge (Final Review):    ITP Comments:  ITP Comments    Row Name 02/28/20 1139           ITP Comments Virtual orientation call completed today. She has an appointment on Date: 03/04/2020 for EP eval and gym Orientation.  Documentation of diagnosis [facility] be found [facility]  [facility]              Comments: initial ITP

## 2020-04-21 NOTE — Patient Instructions (Signed)
Patient Instructions  Patient Details  Name: Brianna Adams MRN: [id number] Date of Birth: Mar 28, 1941 Referring Provider:  Tyler Pita, MD  Below are your personal goals for exercise, nutrition, and risk factors. Our goal is to help you stay on track towards obtaining and maintaining these goals. We will be discussing your progress on these goals with you throughout the program.  Initial Exercise Prescription:  Initial Exercise Prescription - 04/21/20 1600      Date of Initial Exercise RX and Referring Provider   Date 04/21/20    Referring Provider [name]      Treadmill   MPH 0.8    Grade 0    Minutes 15    METs 1      Recumbant Bike   Level 1    RPM 60    Watts 5    Minutes 15    METs 1      NuStep   Level 1    SPM 80    Minutes 15    METs 1      Arm Ergometer   Level 1    RPM 25    Minutes 15    METs 1      Recumbant Elliptical   Level 1    RPM 30    Minutes 15    METs 1      Prescription Details   Frequency (times per week) 3    Duration Progress to 30 minutes of continuous aerobic without signs/symptoms of physical distress      Intensity   THRR 40-80% of Max Heartrate 107-129    Ratings of Perceived Exertion 11-13    Perceived Dyspnea 0-4      Resistance Training   Training Prescription Yes    Weight 2 lb    Reps 10-15           Exercise Goals: Frequency: Be able to perform aerobic exercise two to three times per week in program working toward 2-5 days per week of home exercise.  Intensity: Work with a perceived exertion of 11 (fairly light) - 15 (hard) while following your exercise prescription.  We will make changes to your prescription with you as you progress through the program.   Duration: Be able to do 30 to 45 minutes of continuous aerobic exercise in addition to a 5 minute warm-up and a 5 minute cool-down routine.   Nutrition Goals: Your personal nutrition goals will be established when you do your nutrition analysis with  the dietician.  The following are general nutrition guidelines to follow: Cholesterol < 225m/day Sodium < 15069mday Fiber: Women over 50 yrs - 21 grams per day  Personal Goals:  Personal Goals and Risk Factors at Admission - 04/21/20 1623      Core Components/Risk Factors/Patient Goals on Admission    Weight Management Yes    Intervention Weight Management: Develop a combined nutrition and exercise program designed to reach desired caloric intake, while maintaining appropriate intake of nutrient and fiber, sodium and fats, and appropriate energy expenditure required for the weight goal.;Weight Management: Provide education and appropriate resources to help participant work on and attain dietary goals.    Admit Weight 146 lb 11.2 oz (66.5 kg)    Goal Weight: Short Term 146 lb 11.2 oz (66.5 kg)    Goal Weight: Long Term 146 lb 11.2 oz (66.5 kg)    Expected Outcomes Short Term: Continue to assess and modify interventions until short term weight is achieved;Long Term: Adherence to  nutrition and physical activity/exercise program aimed toward attainment of established weight goal;Weight Maintenance: Understanding of the daily nutrition guidelines, which includes 25-35% calories from fat, 7% or less cal from saturated fats, less than 245m cholesterol, less than 1.5gm of sodium, & 5 or more servings of fruits and vegetables daily           Tobacco Use Initial Evaluation: Social History   Tobacco Use  Smoking Status Former Smoker   Packs/day: 1.00   Years: 58.00   Pack years: 58.00   Types: Cigarettes   Quit date: 10/22/2015   Years since quitting: 4.5  Smokeless Tobacco Never Used    Exercise Goals and Review:  Exercise Goals    Row Name 04/21/20 1617             Exercise Goals   Increase Physical Activity Yes       Intervention Provide advice, education, support and counseling about physical activity/exercise needs.;Develop an individualized exercise prescription for  aerobic and resistive training based on initial evaluation findings, risk stratification, comorbidities and participant's personal goals.       Expected Outcomes Short Term: Attend rehab on a regular basis to increase amount of physical activity.;Long Term: Add in home exercise to make exercise part of routine and to increase amount of physical activity.;Long Term: Exercising regularly at least 3-5 days a week.       Increase Strength and Stamina Yes       Intervention Provide advice, education, support and counseling about physical activity/exercise needs.;Develop an individualized exercise prescription for aerobic and resistive training based on initial evaluation findings, risk stratification, comorbidities and participant's personal goals.       Expected Outcomes Short Term: Increase workloads from initial exercise prescription for resistance, speed, and METs.;Short Term: Perform resistance training exercises routinely during rehab and add in resistance training at home;Long Term: Improve cardiorespiratory fitness, muscular endurance and strength as measured by increased METs and functional capacity (6MWT)       Able to understand and use rate of perceived exertion (RPE) scale Yes       Intervention Provide education and explanation on how to use RPE scale       Expected Outcomes Short Term: Able to use RPE daily in rehab to express subjective intensity level;Long Term:  Able to use RPE to guide intensity level when exercising independently       Able to understand and use Dyspnea scale Yes       Intervention Provide education and explanation on how to use Dyspnea scale       Expected Outcomes Short Term: Able to use Dyspnea scale daily in rehab to express subjective sense of shortness of breath during exertion;Long Term: Able to use Dyspnea scale to guide intensity level when exercising independently       Knowledge and understanding of Target Heart Rate Range (THRR) Yes       Intervention Provide  education and explanation of THRR including how the numbers were predicted and where they are located for reference       Expected Outcomes Short Term: Able to state/look up THRR;Short Term: Able to use daily as guideline for intensity in rehab;Long Term: Able to use THRR to govern intensity when exercising independently       Able to check pulse independently Yes       Intervention Provide education and demonstration on how to check pulse in carotid and radial arteries.;Review the importance of being able to check your own  pulse for safety during independent exercise       Expected Outcomes Short Term: Able to explain why pulse checking is important during independent exercise;Long Term: Able to check pulse independently and accurately       Understanding of Exercise Prescription Yes       Intervention Provide education, explanation, and written materials on patient's individual exercise prescription       Expected Outcomes Short Term: Able to explain program exercise prescription;Long Term: Able to explain home exercise prescription to exercise independently              Copy of goals given to participant.

## 2020-04-23 ENCOUNTER — Other Ambulatory Visit: Payer: Self-pay

## 2020-04-23 ENCOUNTER — Ambulatory Visit
Admission: RE | Admit: 2020-04-23 | Discharge: 2020-04-23 | Disposition: A | Discharge status: Home / Self Care | Payer: Medicare Other | Source: Ambulatory Visit | Attending: Pulmonary Disease | Admitting: Pulmonary Disease

## 2020-04-23 DIAGNOSIS — I340 Nonrheumatic mitral (valve) insufficiency: Secondary | ICD-10-CM | POA: Diagnosis not present

## 2020-04-23 DIAGNOSIS — E119 Type 2 diabetes mellitus without complications: Secondary | ICD-10-CM | POA: Insufficient documentation

## 2020-04-23 DIAGNOSIS — R0602 Shortness of breath: Secondary | ICD-10-CM | POA: Insufficient documentation

## 2020-04-23 DIAGNOSIS — J449 Chronic obstructive pulmonary disease, unspecified: Secondary | ICD-10-CM | POA: Diagnosis not present

## 2020-04-23 DIAGNOSIS — I509 Heart failure, unspecified: Secondary | ICD-10-CM | POA: Insufficient documentation

## 2020-04-23 DIAGNOSIS — Z86711 Personal history of pulmonary embolism: Secondary | ICD-10-CM | POA: Diagnosis not present

## 2020-04-23 DIAGNOSIS — I272 Pulmonary hypertension, unspecified: Secondary | ICD-10-CM | POA: Insufficient documentation

## 2020-04-23 LAB — ECHOCARDIOGRAM COMPLETE
AR max vel: 1.96 cm2
AV Area VTI: 2.24 cm2
AV Area mean vel: 1.86 cm2
AV Mean grad: 3 mmHg
AV Peak grad: 6.3 mmHg
Ao pk vel: 1.26 m/s
Area-P 1/2: 3.1 cm2
S' Lateral: 2.45 cm

## 2020-04-23 NOTE — Progress Notes (Signed)
*  PRELIMINARY RESULTS* Echocardiogram 2D Echocardiogram has been performed.  Sherrie Sport 04/23/2020, 11:01 AM

## 2020-05-06 ENCOUNTER — Other Ambulatory Visit: Payer: Self-pay

## 2020-05-06 ENCOUNTER — Encounter: Payer: Medicare Other | Admitting: *Deleted

## 2020-05-06 DIAGNOSIS — J439 Emphysema, unspecified: Secondary | ICD-10-CM | POA: Diagnosis not present

## 2020-05-06 LAB — GLUCOSE, CAPILLARY
Glucose-Capillary: 149 mg/dL — ABNORMAL HIGH (ref 70–99)
Glucose-Capillary: 120 mg/dL — ABNORMAL HIGH (ref 70–99)

## 2020-05-06 NOTE — Progress Notes (Signed)
Daily Session Note  Patient Details  Name: Brianna Adams MRN: [id number] Date of Birth: 05/29/1941 Referring Provider:     Pulmonary Rehab from 04/21/2020 in [facility]  Referring Provider Gonzalez      Encounter Date: 05/06/2020  Check In:  Session Check In - 05/06/20 1151      Check-In   Supervising physician immediately available to respond to emergencies See telemetry face sheet for immediately available ER MD    Location [facility]    Staff Present Susanne Bice, RN, BSN, CCRP;Melissa Caiola RDN, LDN;Joseph Hood RCP,RRT,BSRT;Amanda Sommer, BA, ACSM CEP, Exercise Physiologist    Virtual Visit No    Medication changes reported     No    Fall or balance concerns reported    No    Warm-up and Cool-down Performed on first and last piece of equipment    Resistance Training Performed Yes    VAD Patient? No    PAD/SET Patient? No      Pain Assessment   Currently in Pain? No/denies              Social History   Tobacco Use  Smoking Status Former Smoker  . Packs/day: 1.00  . Years: 58.00  . Pack years: 58.00  . Types: Cigarettes  . Quit date: 10/22/2015  . Years since quitting: 4.5  Smokeless Tobacco Never Used    Goals Met:  Proper associated with RPD/PD & O2 Sat Exercise tolerated well Personal goals reviewed No report of cardiac concerns or symptoms  Goals Unmet:  Not Applicable  Comments: First full day of exercise!  Patient was oriented to gym and equipment including functions, settings, policies, and procedures.  Patient's individual exercise prescription and treatment plan were reviewed.  All starting workloads were established based on the results of the 6 minute walk test done at initial orientation visit.  The plan for exercise progression was also introduced and progression will be customized based on patient's performance and goals.    Dr. Mark Miller is Medical Director for [facility]  and [name]. 

## 2020-05-08 ENCOUNTER — Other Ambulatory Visit: Payer: Self-pay | Admitting: Orthopedic Surgery

## 2020-05-13 ENCOUNTER — Encounter: Payer: Self-pay | Admitting: Nurse Practitioner

## 2020-05-13 ENCOUNTER — Ambulatory Visit (INDEPENDENT_AMBULATORY_CARE_PROVIDER_SITE_OTHER): Payer: Medicare Other | Admitting: Nurse Practitioner

## 2020-05-13 ENCOUNTER — Other Ambulatory Visit: Payer: Self-pay

## 2020-05-13 VITALS — BP 134/72 | HR 76 | Temp 97.8°F | Ht 62.75 in | Wt 151.0 lb

## 2020-05-13 DIAGNOSIS — Z23 Encounter for immunization: Secondary | ICD-10-CM | POA: Diagnosis not present

## 2020-05-13 DIAGNOSIS — J449 Chronic obstructive pulmonary disease, unspecified: Secondary | ICD-10-CM | POA: Diagnosis not present

## 2020-05-13 DIAGNOSIS — E7800 Pure hypercholesterolemia, unspecified: Secondary | ICD-10-CM

## 2020-05-13 DIAGNOSIS — M67432 Ganglion, left wrist: Secondary | ICD-10-CM | POA: Diagnosis not present

## 2020-05-13 DIAGNOSIS — E119 Type 2 diabetes mellitus without complications: Secondary | ICD-10-CM

## 2020-05-13 NOTE — Patient Instructions (Signed)
Influenza Virus Vaccine (Flucelvax) What is this medicine? INFLUENZA VIRUS VACCINE (in floo EN zuh VAHY ruhs vak SEEN) helps to reduce the risk of getting influenza also known as the flu. The vaccine only helps protect you against some strains of the flu. This medicine may be used for other purposes; ask your health care provider or pharmacist if you have questions. COMMON BRAND NAME(S): FLUCELVAX What should I tell my health care provider before I take this medicine? They need to know if you have any of these conditions:  bleeding disorder like hemophilia  fever or infection  Guillain-Barre syndrome or other neurological problems  immune system problems  infection with the human immunodeficiency virus (HIV) or AIDS  low blood platelet counts  multiple sclerosis  an unusual or allergic reaction to influenza virus vaccine, other medicines, foods, dyes or preservatives  pregnant or trying to get pregnant  breast-feeding How should I use this medicine? This vaccine is for injection into a muscle. It is given by a health care professional. A copy of Vaccine Information Statements will be given before each vaccination. Read this sheet carefully each time. The sheet may change frequently. Talk to your pediatrician regarding the use of this medicine in children. Special care may be needed. Overdosage: If you think you've taken too much of this medicine contact a poison control center or emergency room at once. Overdosage: If you think you have taken too much of this medicine contact a poison control center or emergency room at once. NOTE: This medicine is only for you. Do not share this medicine with others. What if I miss a dose? This does not apply. What may interact with this medicine?  chemotherapy or radiation therapy  medicines that lower your immune system like etanercept, anakinra, infliximab, and adalimumab  medicines that treat or prevent blood clots like  warfarin  phenytoin  steroid medicines like prednisone or cortisone  theophylline  vaccines This list may not describe all possible interactions. Give your health care provider a list of all the medicines, herbs, non-prescription drugs, or dietary supplements you use. Also tell them if you smoke, drink alcohol, or use illegal drugs. Some items may interact with your medicine. What should I watch for while using this medicine? Report any side effects that do not go away within 3 days to your doctor or health care professional. Call your health care provider if any unusual symptoms occur within 6 weeks of receiving this vaccine. You may still catch the flu, but the illness is not usually as bad. You cannot get the flu from the vaccine. The vaccine will not protect against colds or other illnesses that may cause fever. The vaccine is needed every year. What side effects may I notice from receiving this medicine? Side effects that you should report to your doctor or health care professional as soon as possible:  allergic reactions like skin rash, itching or hives, swelling of the face, lips, or tongue Side effects that usually do not require medical attention (Report these to your doctor or health care professional if they continue or are bothersome.):  fever  headache  muscle aches and pains  pain, tenderness, redness, or swelling at the injection site  tiredness This list may not describe all possible side effects. Call your doctor for medical advice about side effects. You may report side effects to FDA at 1-800-FDA-1088. Where should I keep my medicine? The vaccine will be given by a health care professional in a clinic, pharmacy, doctor's  office, or other health care setting. You will not be given vaccine doses to store at home. NOTE: This sheet is a summary. It may not cover all possible information. If you have questions about this medicine, talk to your doctor, pharmacist, or  health care provider.  2020 Elsevier/Gold Standard (2011-08-04 14:06:47)

## 2020-05-13 NOTE — Progress Notes (Signed)
Subjective:     Patient ID: Brianna Adams , female    DOB: Sep 24, 1940 , 79 y.o.   MRN: [id number]   Chief Complaint  Patient presents with  . Hypertension  . Diabetes    HPI  She is now in pulmonary rehab.  She is scheduled to have the ganglion cyst removed in October.   Seen at dentist last week due to soreness in her mouth.  She had an ulcer on her tongue. Treated with magic mouth wash     Diabetes She presents for her follow-up diabetic visit. She has type 2 diabetes mellitus. Her disease course has been stable. Pertinent negatives for hypoglycemia include no dizziness or headaches. There are no hypoglycemic complications. There are no diabetic complications. Risk factors for coronary artery disease include hypertension and obesity. She is compliant with treatment all of the time. She has not had a previous visit with a dietitian. She rarely (twice a week with pulmonary rehab.) participates in exercise. (Does not check regularly and has not for several years) She does not see a podiatrist.Eye exam is not current.  Hypertension This is a chronic problem. The current episode started more than 1 year ago. The problem is controlled. Pertinent negatives include no headaches, palpitations or shortness of breath. There are no compliance problems.  There is no history of angina. There is no history of chronic renal disease.     Past Medical History:  Diagnosis Date  . CHF (congestive heart failure) ([place])   . COPD (chronic obstructive pulmonary disease) ([place])   . Diabetes ([place])   . Diverticulitis   . High cholesterol   . Lung cancer ([place])   . Pulmonary hypertension ([place])   . Uterine cancer ([place])      Family History  Problem Relation Age of Onset  . Hypertension Mother   . Heart attack Father   . Cancer Maternal Aunt      Current Outpatient Medications:  .  acetaminophen (TYLENOL) 325 MG tablet, Take 325-650 mg by mouth every 6 (six) hours as needed for mild pain or fever. ,  Disp: , Rfl:  .  albuterol (VENTOLIN HFA) 108 (90 Base) MCG/ACT inhaler, Inhale 2 puffs into the lungs every 6 (six) hours as needed for wheezing or shortness of breath., Disp: 18 g, Rfl: 0 .  calcium carbonate (OSCAL) 1500 (600 Ca) MG TABS tablet, Take 1,200 mg by mouth daily., Disp: , Rfl:  .  Cinnamon 500 MG TABS, Take 2,000 mg by mouth daily., Disp: , Rfl:  .  ELIQUIS 5 MG TABS tablet, TWO TABS TWICE A DAY FOR 6 MORE DAYS THEN ONE TABLET TWICE A DAY, Disp: 72 tablet, Rfl: 0 .  furosemide (LASIX) 20 MG tablet, TAKE 1 TABLET BY MOUTH EVERY DAY, Disp: 90 tablet, Rfl: 2 .  metFORMIN (GLUCOPHAGE) 1000 MG tablet, Take 1 tablet (1,000 mg total) by mouth 2 (two) times daily with a meal., Disp: 180 tablet, Rfl: 0 .  Multiple Vitamins-Minerals (CENTRUM SILVER PO), Take 1 tablet by mouth daily. , Disp: , Rfl:  .  omeprazole (PRILOSEC) 40 MG capsule, Take 1 capsule (40 mg total) by mouth daily., Disp: 90 capsule, Rfl: 1 .  pravastatin (PRAVACHOL) 10 MG tablet, TAKE 1 TABLET BY MOUTH EVERY DAY, Disp: 90 tablet, Rfl: 1 .  umeclidinium-vilanterol (ANORO ELLIPTA) 62.5-25 MCG/INH AEPB, Inhale 1 puff into the lungs daily. , Disp: , Rfl:  .  vitamin C (ASCORBIC ACID) 500 MG tablet, Take 500 mg by mouth 4 (  four) times a week. , Disp: , Rfl:    Allergies  Allergen Reactions  . Penicillin G Shortness Of Breath    Other reaction(s): Rash  . Ace Inhibitors Swelling  . Atorvastatin     Other reaction(s): Myalgias (Muscle Pain) Other reaction(s): Myalgias (muscle pain)  . Cortisone     Other reaction(s): Patient not sure  . Hydrocortisone Other (See Comments)  . Lisinopril     Other reaction(s): Angioedema  . Meloxicam Other (See Comments)    Bloody stools  . Penicillins Hives  . Pravastatin     Other reaction(s): Chest Pain     Review of Systems  Constitutional: Negative.   Respiratory: Negative.  Negative for shortness of breath.   Cardiovascular: Negative.  Negative for palpitations and leg  swelling.  Musculoskeletal: Negative.   Skin: Negative.   Neurological: Negative for dizziness and headaches.  Psychiatric/Behavioral: Negative.      Today's Vitals   05/13/20 0956  BP: 134/72  Pulse: 76  Temp: 97.8 F (36.6 C)  TempSrc: Oral  SpO2: 99%  Weight: 151 lb (68.5 kg)  Height: 5' 2.75" (1.594 m)  PainSc: 0-No pain   Body mass index is 26.96 kg/m.   Objective:  Physical Exam Vitals reviewed.  Constitutional:      Appearance: Normal appearance.  Cardiovascular:     Rate and Rhythm: Normal rate and regular rhythm.     Pulses: Normal pulses.     Heart sounds: Normal heart sounds. No murmur heard.   Pulmonary:     Effort: Pulmonary effort is normal. No respiratory distress.     Comments: Wearing oxygen via [place]:    Capillary Refill: Capillary refill takes less than 2 seconds.  Neurological:     General: No focal deficit present.     Mental Status: She is alert and oriented to person, place, and time.  Psychiatric:        Mood and Affect: Mood normal.        Behavior: Behavior normal.        Thought Content: Thought content normal.        Judgment: Judgment normal.         Assessment And Plan:     1. Type 2 diabetes mellitus without complication, without long-term current use of insulin (HCC) Chronic, stable No current medications  2. Chronic obstructive pulmonary disease, unspecified COPD type ([place])  She is now wearing her oxygen around the clock  Continue follow up with pulmonology  3. Ganglion cyst of wrist, left  She is to have this removed in the next couple of weeks  4. Elevated cholesterol  Chronic, controlled  Continue with current medications  Will request records from [place], [place] [facility] DUE TO THE COVID-19 PANDEMIC.

## 2020-05-14 ENCOUNTER — Encounter: Payer: Self-pay | Admitting: *Deleted

## 2020-05-14 DIAGNOSIS — J439 Emphysema, unspecified: Secondary | ICD-10-CM

## 2020-05-14 LAB — CMP14+EGFR
ALT: 12 IU/L (ref 0–32)
AST: 19 IU/L (ref 0–40)
Albumin/Globulin Ratio: 1.3 (ref 1.2–2.2)
Albumin: 4 g/dL (ref 3.7–4.7)
Alkaline Phosphatase: 86 IU/L (ref 48–121)
BUN/Creatinine Ratio: 14 (ref 12–28)
BUN: 11 mg/dL (ref 8–27)
Bilirubin Total: 0.3 mg/dL (ref 0.0–1.2)
CO2: 20 mmol/L (ref 20–29)
Calcium: 9.5 mg/dL (ref 8.7–10.3)
Chloride: 103 mmol/L (ref 96–106)
Creatinine, Ser: 0.8 mg/dL (ref 0.57–1.00)
GFR calc Af Amer: 81 mL/min/{1.73_m2} (ref >59)
GFR calc non Af Amer: 70 mL/min/{1.73_m2} (ref >59)
Globulin, Total: 3.2 g/dL (ref 1.5–4.5)
Glucose: 127 mg/dL — ABNORMAL HIGH (ref 65–99)
Potassium: 4.5 mmol/L (ref 3.5–5.2)
Sodium: 140 mmol/L (ref 134–144)
Total Protein: 7.2 g/dL (ref 6.0–8.5)

## 2020-05-14 LAB — LIPID PANEL
Chol/HDL Ratio: 2.8 ratio (ref 0.0–4.4)
Cholesterol, Total: 168 mg/dL (ref 100–199)
HDL: 61 mg/dL (ref >39)
LDL Chol Calc (NIH): 94 mg/dL (ref 0–99)
Triglycerides: 67 mg/dL (ref 0–149)
VLDL Cholesterol Cal: 13 mg/dL (ref 5–40)

## 2020-05-14 LAB — HEMOGLOBIN A1C
Est. average glucose Bld gHb Est-mCnc: 126 mg/dL
Hgb A1c MFr Bld: 6 % — ABNORMAL HIGH (ref 4.8–5.6)

## 2020-05-14 NOTE — Progress Notes (Signed)
Pulmonary Individual Treatment Plan  Patient Details  Name: Brianna Adams MRN: [id number] Date of Birth: September 29, 1940 Referring Provider:     Pulmonary Rehab from 04/21/2020 in [facility] Cardiac and Pulmonary Rehab  Referring Provider [name]      Initial Encounter Date:    Pulmonary Rehab from 04/21/2020 in [facility] Cardiac and Pulmonary Rehab  Date 04/21/20      Visit Diagnosis: Pulmonary emphysema, unspecified emphysema type ([place])  Patient's Home Medications on Admission:  Current Outpatient Medications:  .  acetaminophen (TYLENOL) 325 MG tablet, Take 325-650 mg by mouth every 6 (six) hours as needed for mild pain or fever. , Disp: , Rfl:  .  albuterol (VENTOLIN HFA) 108 (90 Base) MCG/ACT inhaler, Inhale 2 puffs into the lungs every 6 (six) hours as needed for wheezing or shortness of breath., Disp: 18 g, Rfl: 0 .  calcium carbonate (OSCAL) 1500 (600 Ca) MG TABS tablet, Take 1,200 mg by mouth daily., Disp: , Rfl:  .  Cinnamon 500 MG TABS, Take 2,000 mg by mouth daily., Disp: , Rfl:  .  ELIQUIS 5 MG TABS tablet, TWO TABS TWICE A DAY FOR 6 MORE DAYS THEN ONE TABLET TWICE A DAY, Disp: 72 tablet, Rfl: 0 .  furosemide (LASIX) 20 MG tablet, TAKE 1 TABLET BY MOUTH EVERY DAY, Disp: 90 tablet, Rfl: 2 .  metFORMIN (GLUCOPHAGE) 1000 MG tablet, Take 1 tablet (1,000 mg total) by mouth 2 (two) times daily with a meal., Disp: 180 tablet, Rfl: 0 .  Multiple Vitamins-Minerals (CENTRUM SILVER PO), Take 1 tablet by mouth daily. , Disp: , Rfl:  .  omeprazole (PRILOSEC) 40 MG capsule, Take 1 capsule (40 mg total) by mouth daily., Disp: 90 capsule, Rfl: 1 .  pravastatin (PRAVACHOL) 10 MG tablet, TAKE 1 TABLET BY MOUTH EVERY DAY, Disp: 90 tablet, Rfl: 1 .  umeclidinium-vilanterol (ANORO ELLIPTA) 62.5-25 MCG/INH AEPB, Inhale 1 puff into the lungs daily. , Disp: , Rfl:  .  vitamin C (ASCORBIC ACID) 500 MG tablet, Take 500 mg by mouth 4 (four) times a week. , Disp: , Rfl:   Past Medical History: Past Medical History:   Diagnosis Date  . CHF (congestive heart failure) ([place])   . COPD (chronic obstructive pulmonary disease) ([place])   . Diabetes ([place])   . Diverticulitis   . High cholesterol   . Lung cancer ([place])   . Pulmonary hypertension ([place])   . Uterine cancer ([place])     Tobacco Use: Social History   Tobacco Use  Smoking Status Former Smoker  . Packs/day: 1.00  . Years: 58.00  . Pack years: 58.00  . Types: Cigarettes  . Quit date: 10/22/2015  . Years since quitting: 4.5  Smokeless Tobacco Never Used    Labs: Recent Review Flowsheet Data    Labs for ITP Cardiac and Pulmonary Rehab Latest Ref Rng & Units 06/14/2019 10/04/2019 01/07/2020 02/29/2020 05/13/2020   Cholestrol 100 - 199 mg/dL - 152 149 - 168   LDLCALC 0 - 99 mg/dL - 80 73 - 94   HDL >39 mg/dL - 57 61 - 61   Trlycerides 0 - 149 mg/dL - 76 80 - 67   Hemoglobin A1c 4.8 - 5.6 % 6.3(H) 6.2(H) 5.8(H) - 6.0(H)   HCO3 20.0 - 28.0 mmol/L - - - 19.0(L) -   ACIDBASEDEF 0.0 - 2.0 mmol/L - - - 4.0(H) -   O2SAT % - - - 99.6 -       Pulmonary Assessment Scores:  Pulmonary Assessment Scores  [place] Name 04/21/20 1628         ADL UCSD   SOB Score total 68     Rest 0     Walk 3     Stairs 4     Bath 2     Dress 1     Shop 3       CAT Score   CAT Score 22       mMRC Score   mMRC Score 2            UCSD: Self-administered rating of dyspnea associated with activities of daily living (ADLs) 6-point scale (0 = "not at all" to 5 = "maximal or unable to do because of breathlessness")  Scoring Scores range from 0 to 120.  Minimally important difference is 5 units  CAT: CAT can identify the health impairment of COPD patients and is better correlated with disease progression.  CAT has a scoring range of zero to 40. The CAT score is classified into four groups of low (less than 10), medium (10 - 20), high (21-30) and very high (31-40) based on the impact level of disease on health status. A CAT score over 10 suggests significant symptoms.  A  worsening CAT score could be explained by an exacerbation, poor medication adherence, poor inhaler technique, or progression of COPD or comorbid conditions.  CAT MCID is 2 points  mMRC: mMRC (Modified Medical Research Council) Dyspnea Scale is used to assess the degree of baseline functional disability in patients of respiratory disease due to dyspnea. No minimal important difference is established. A decrease in score of 1 point or greater is considered a positive change.   Pulmonary Function Assessment:   Exercise Target Goals: Exercise Program Goal: Individual exercise prescription set using results from initial 6 min walk test and THRR while considering  patient's activity barriers and safety.   Exercise Prescription Goal: Initial exercise prescription builds to 30-45 minutes a day of aerobic activity, 2-3 days per week.  Home exercise guidelines will be given to patient during program as part of exercise prescription that the participant will acknowledge.  Education: Aerobic Exercise & Resistance Training: - Gives group verbal and written instruction on the various components of exercise. Focuses on aerobic and resistive training programs and the benefits of this training and how to safely progress through these programs..   Education: Exercise & Equipment Safety: - Individual verbal instruction and demonstration of equipment use and safety with use of the equipment.   Pulmonary Rehab from 04/21/2020 in [facility] Cardiac and Pulmonary Rehab  Date 04/21/20  Educator AS  Instruction Review Code 1- Verbalizes Understanding      Education: Exercise Physiology & General Exercise Guidelines: - Group verbal and written instruction with models to review the exercise physiology of the cardiovascular system and associated critical values. Provides general exercise guidelines with specific guidelines to those with heart or lung disease.    Education: Flexibility, Balance, Mind/Body  Relaxation: Provides group verbal/written instruction on the benefits of flexibility and balance training, including mind/body exercise modes such as yoga, pilates and tai chi.  Demonstration and skill practice provided.   Activity Barriers & Risk Stratification:  Activity Barriers & Cardiac Risk Stratification - 02/28/20 1107      Activity Barriers & Cardiac Risk Stratification   Activity Barriers None           6 Minute Walk:  6 Minute Walk    Row Name 04/21/20 1609  6 Minute Walk   Phase Initial     Distance 380 feet     Walk Time 4.5 minutes     # of Rest Breaks 3     MPH 0.95     METS 0.8     RPE 11     Perceived Dyspnea  2     VO2 Peak 2.75     Symptoms No     Resting HR 85 bpm     Resting BP 120/60     Resting Oxygen Saturation  98 %     Exercise Oxygen Saturation  during 6 min walk 90 %     Max Ex. HR 103 bpm     Max Ex. BP 134/66     2 Minute Post BP 130/62       Interval HR   1 Minute HR 102     2 Minute HR 93     3 Minute HR 100     4 Minute HR 100     5 Minute HR 101     6 Minute HR 103     2 Minute Post HR 82     Interval Heart Rate? Yes       Interval Oxygen   Interval Oxygen? Yes     Baseline Oxygen Saturation % 98 %     1 Minute Oxygen Saturation % 92 %     1 Minute Liters of Oxygen 3 L     2 Minute Oxygen Saturation % 90 %     2 Minute Liters of Oxygen 3 L     3 Minute Oxygen Saturation % 94 %     3 Minute Liters of Oxygen 3 L     4 Minute Oxygen Saturation % 90 %     4 Minute Liters of Oxygen 3 L     5 Minute Oxygen Saturation % 93 %     5 Minute Liters of Oxygen 3 L     6 Minute Oxygen Saturation % 93 %     6 Minute Liters of Oxygen 3 L     2 Minute Post Oxygen Saturation % 98 %     2 Minute Post Liters of Oxygen 3 L           Oxygen Initial Assessment:  Oxygen Initial Assessment - 05/06/20 1152      Home Oxygen   Home Oxygen Device Portable Concentrator;Home Concentrator    Sleep Oxygen Prescription Continuous     Liters per minute 2    Home Exercise Oxygen Prescription None    Home at Rest Exercise Oxygen Prescription Continuous    Liters per minute -1    Compliance with Home Oxygen Use Yes      Intervention   Short Term Goals To learn and exhibit compliance with exercise, home and travel O2 prescription;To learn and understand importance of monitoring SPO2 with pulse oximeter and demonstrate accurate use of the pulse oximeter.;To learn and understand importance of maintaining oxygen saturations>88%;To learn and demonstrate proper pursed lip breathing techniques or other breathing techniques.;To learn and demonstrate proper use of respiratory medications    Long  Term Goals Verbalizes importance of monitoring SPO2 with pulse oximeter and return demonstration;Exhibits proper breathing techniques, such as pursed lip breathing or other method taught during program session;Maintenance of O2 saturations>88%;Exhibits compliance with exercise, home and travel O2 prescription;Demonstrates proper use of MDI's;Compliance with respiratory medication           Oxygen  Re-Evaluation:  Oxygen Re-Evaluation    Row Name 05/06/20 1153             Goals/Expected Outcomes   Short Term Goals To learn and demonstrate proper pursed lip breathing techniques or other breathing techniques.       Long  Term Goals Exhibits proper breathing techniques, such as pursed lip breathing or other method taught during program session       Comments Reviewed PLB technique with pt.  Talked about how it works and it's importance in maintaining their exercise saturations.       Goals/Expected Outcomes Short: Become more profiecient at using PLB.   Long: Become independent at using PLB.              Oxygen Discharge (Final Oxygen Re-Evaluation):  Oxygen Re-Evaluation - 05/06/20 1153      Goals/Expected Outcomes   Short Term Goals To learn and demonstrate proper pursed lip breathing techniques or other breathing techniques.     Long  Term Goals Exhibits proper breathing techniques, such as pursed lip breathing or other method taught during program session    Comments Reviewed PLB technique with pt.  Talked about how it works and it's importance in maintaining their exercise saturations.    Goals/Expected Outcomes Short: Become more profiecient at using PLB.   Long: Become independent at using PLB.           Initial Exercise Prescription:  Initial Exercise Prescription - 04/21/20 1600      Date of Initial Exercise RX and Referring Provider   Date 04/21/20    Referring Provider [name]      Treadmill   MPH 0.8    Grade 0    Minutes 15    METs 1      Recumbant Bike   Level 1    RPM 60    Watts 5    Minutes 15    METs 1      NuStep   Level 1    SPM 80    Minutes 15    METs 1      Arm Ergometer   Level 1    RPM 25    Minutes 15    METs 1      Recumbant Elliptical   Level 1    RPM 30    Minutes 15    METs 1      Prescription Details   Frequency (times per week) 3    Duration Progress to 30 minutes of continuous aerobic without signs/symptoms of physical distress      Intensity   THRR 40-80% of Max Heartrate 107-129    Ratings of Perceived Exertion 11-13    Perceived Dyspnea 0-4      Resistance Training   Training Prescription Yes    Weight 2 lb    Reps 10-15           Perform Capillary Blood Glucose checks as needed.  Exercise Prescription Changes:  Exercise Prescription Changes    Row Name 04/21/20 1600 05/06/20 1500           Response to Exercise   Blood Pressure (Admit) 120/60 124/64      Blood Pressure (Exercise) 134/66 128/70      Blood Pressure (Exit) 130/62 122/62      Heart Rate (Admit) 85 bpm 97 bpm      Heart Rate (Exercise) 103 bpm 100 bpm      Heart Rate (Exit) 82 bpm 90  bpm      Oxygen Saturation (Admit) 98 % 89 %      Oxygen Saturation (Exercise) 90 % 96 %      Oxygen Saturation (Exit) 98 % 99 %      Rating of Perceived Exertion (Exercise) 14 12       Perceived Dyspnea (Exercise) 2 1      Symptoms none none      Comments -- first day      Duration -- Progress to 30 minutes of  aerobic without signs/symptoms of physical distress        Progression   Progression -- Continue to progress workloads to maintain intensity without signs/symptoms of physical distress.      Average METs -- 2        Resistance Training   Training Prescription -- Yes      Weight -- 2 lb      Reps -- 10-15        Interval Training   Interval Training -- No        Recumbant Bike   Level -- 1      RPM -- 60      Watts -- 5      Minutes -- 15      METs -- 1        Arm Ergometer   Level -- 1      Minutes -- 15      METs -- 2             Exercise Comments:  Exercise Comments    Row Name 05/06/20 1152           Exercise Comments First full day of exercise!  Patient was oriented to gym and equipment including functions, settings, policies, and procedures.  Patient's individual exercise prescription and treatment plan were reviewed.  All starting workloads were established based on the results of the 6 minute walk test done at initial orientation visit.  The plan for exercise progression was also introduced and progression will be customized based on patient's performance and goals.              Exercise Goals and Review:  Exercise Goals    Row Name 04/21/20 1617             Exercise Goals   Increase Physical Activity Yes       Intervention Provide advice, education, support and counseling about physical activity/exercise needs.;Develop an individualized exercise prescription for aerobic and resistive training based on initial evaluation findings, risk stratification, comorbidities and participant's personal goals.       Expected Outcomes Short Term: Attend rehab on a regular basis to increase amount of physical activity.;Long Term: Add in home exercise to make exercise part of routine and to increase amount of physical activity.;Long Term:  Exercising regularly at least 3-5 days a week.       Increase Strength and Stamina Yes       Intervention Provide advice, education, support and counseling about physical activity/exercise needs.;Develop an individualized exercise prescription for aerobic and resistive training based on initial evaluation findings, risk stratification, comorbidities and participant's personal goals.       Expected Outcomes Short Term: Increase workloads from initial exercise prescription for resistance, speed, and METs.;Short Term: Perform resistance training exercises routinely during rehab and add in resistance training at home;Long Term: Improve cardiorespiratory fitness, muscular endurance and strength as measured by increased METs and functional capacity (6MWT)  Able to understand and use rate of perceived exertion (RPE) scale Yes       Intervention Provide education and explanation on how to use RPE scale       Expected Outcomes Short Term: Able to use RPE daily in rehab to express subjective intensity level;Long Term:  Able to use RPE to guide intensity level when exercising independently       Able to understand and use Dyspnea scale Yes       Intervention Provide education and explanation on how to use Dyspnea scale       Expected Outcomes Short Term: Able to use Dyspnea scale daily in rehab to express subjective sense of shortness of breath during exertion;Long Term: Able to use Dyspnea scale to guide intensity level when exercising independently       Knowledge and understanding of Target Heart Rate Range (THRR) Yes       Intervention Provide education and explanation of THRR including how the numbers were predicted and where they are located for reference       Expected Outcomes Short Term: Able to state/look up THRR;Short Term: Able to use daily as guideline for intensity in rehab;Long Term: Able to use THRR to govern intensity when exercising independently       Able to check pulse independently Yes        Intervention Provide education and demonstration on how to check pulse in carotid and radial arteries.;Review the importance of being able to check your own pulse for safety during independent exercise       Expected Outcomes Short Term: Able to explain why pulse checking is important during independent exercise;Long Term: Able to check pulse independently and accurately       Understanding of Exercise Prescription Yes       Intervention Provide education, explanation, and written materials on patient's individual exercise prescription       Expected Outcomes Short Term: Able to explain program exercise prescription;Long Term: Able to explain home exercise prescription to exercise independently              Exercise Goals Re-Evaluation :  Exercise Goals Re-Evaluation    Row Name 05/06/20 1154             Exercise Goal Re-Evaluation   Exercise Goals Review Able to understand and use rate of perceived exertion (RPE) scale;Able to understand and use Dyspnea scale;Knowledge and understanding of Target Heart Rate Range (THRR);Understanding of Exercise Prescription       Comments Reviewed RPE and dyspnea scales, THR and program prescription with pt today.  Pt voiced understanding and was given a copy of goals to take home.       Expected Outcomes Short: Use RPE daily to regulate intensity. Long: Follow program prescription in THR.              Discharge Exercise Prescription (Final Exercise Prescription Changes):  Exercise Prescription Changes - 05/06/20 1500      Response to Exercise   Blood Pressure (Admit) 124/64    Blood Pressure (Exercise) 128/70    Blood Pressure (Exit) 122/62    Heart Rate (Admit) 97 bpm    Heart Rate (Exercise) 100 bpm    Heart Rate (Exit) 90 bpm    Oxygen Saturation (Admit) 89 %    Oxygen Saturation (Exercise) 96 %    Oxygen Saturation (Exit) 99 %    Rating of Perceived Exertion (Exercise) 12    Perceived Dyspnea (Exercise) 1  Symptoms none     Comments first day    Duration Progress to 30 minutes of  aerobic without signs/symptoms of physical distress      Progression   Progression Continue to progress workloads to maintain intensity without signs/symptoms of physical distress.    Average METs 2      Resistance Training   Training Prescription Yes    Weight 2 lb    Reps 10-15      Interval Training   Interval Training No      Recumbant Bike   Level 1    RPM 60    Watts 5    Minutes 15    METs 1      Arm Ergometer   Level 1    Minutes 15    METs 2           Nutrition:  Target Goals: Understanding of nutrition guidelines, daily intake of sodium <1527m, cholesterol <2086m calories 30% from fat and 7% or less from saturated fats, daily to have 5 or more servings of fruits and vegetables.  Education: Controlling Sodium/Reading Food Labels -Group verbal and written material supporting the discussion of sodium use in heart healthy nutrition. Review and explanation with models, verbal and written materials for utilization of the food label.   Education: General Nutrition Guidelines/Fats and Fiber: -Group instruction provided by verbal, written material, models and posters to present the general guidelines for heart healthy nutrition. Gives an explanation and review of dietary fats and fiber.   Biometrics:  Pre Biometrics - 04/21/20 1621      Pre Biometrics   Height 5' 2.75" (1.594 m)    Weight 146 lb 11.2 oz (66.5 kg)   self reported   BMI (Calculated) 26.19            Nutrition Therapy Plan and Nutrition Goals:   Nutrition Assessments:  Nutrition Assessments - 04/21/20 1633      MEDFICTS Scores   Pre Score 18           MEDIFICTS Score Key:          ?70 Need to make dietary changes          40-70 Heart Healthy Diet         ? 40 Therapeutic Level Cholesterol Diet  Nutrition Goals Re-Evaluation:   Nutrition Goals Discharge (Final Nutrition Goals Re-Evaluation):   Psychosocial: Target  Goals: Acknowledge presence or absence of significant depression and/or stress, maximize coping skills, provide positive support system. Participant is able to verbalize types and ability to use techniques and skills needed for reducing stress and depression.   Education: Depression - Provides group verbal and written instruction on the correlation between heart/lung disease and depressed mood, treatment options, and the stigmas associated with seeking treatment.   Education: Sleep Hygiene -Provides group verbal and written instruction about how sleep can affect your health.  Define sleep hygiene, discuss sleep cycles and impact of sleep habits. Review good sleep hygiene tips.    Education: Stress and Anxiety: - Provides group verbal and written instruction about the health risks of elevated stress and causes of high stress.  Discuss the correlation between heart/lung disease and anxiety and treatment options. Review healthy ways to manage with stress and anxiety.   Initial Review & Psychosocial Screening:  Initial Psych Review & Screening - 02/28/20 1121      Initial Review   Current issues with None Identified      Family Dynamics   Good  Support System? Yes   Daughter, church family     Barriers   Psychosocial barriers to participate in program There are no identifiable barriers or psychosocial needs.      Screening Interventions   Interventions Encouraged to exercise    Expected Outcomes Short Term goal: Utilizing psychosocial counselor, staff and physician to assist with identification of specific Stressors or current issues interfering with healing process. Setting desired goal for each stressor or current issue identified.;Long Term Goal: Stressors or current issues are controlled or eliminated.;Short Term goal: Identification and review with participant of any Quality of Life or Depression concerns found by scoring the questionnaire.;Long Term goal: The participant improves quality  of Life and PHQ9 Scores as seen by post scores and/or verbalization of changes           Quality of Life Scores:  Scores of 19 and below usually indicate a poorer quality of life in these areas.  A difference of  2-3 points is a clinically meaningful difference.  A difference of 2-3 points in the total score of the Quality of Life Index has been associated with significant improvement in overall quality of life, self-image, physical symptoms, and general health in studies assessing change in quality of life.  PHQ-9: Recent Review Flowsheet Data    Depression screen [facility] 2/9 04/21/2020 10/04/2019 10/04/2019 06/14/2019 03/29/2018   Decreased Interest 0 0 0 0 0   Down, Depressed, Hopeless 0 0 0 0 0   PHQ - 2 Score 0 0 0 0 0   Altered sleeping 0 0 - - -   Tired, decreased energy 0 0 - - -   Change in appetite 0 0 - - -   Feeling bad or failure about yourself  0 0 - - -   Trouble concentrating 0 0 - - -   Moving slowly or fidgety/restless 0 0 - - -   Suicidal thoughts 0 0 - - -   PHQ-9 Score 0 0 - - -   Difficult doing work/chores Not difficult at all Not difficult at all - - -     Interpretation of Total Score  Total Score Depression Severity:  1-4 = Minimal depression, 5-9 = Mild depression, 10-14 = Moderate depression, 15-19 = Moderately severe depression, 20-27 = Severe depression   Psychosocial Evaluation and Intervention:  Psychosocial Evaluation - 02/28/20 1133      Psychosocial Evaluation & Interventions   Interventions Encouraged to exercise with the program and follow exercise prescription    Comments Benzenaehibits no barriers to attending the program. She is ready to get started.  She has noticed that she is more shoort of breath than her usual. Could walk a hall before getting SOB, not it happens sooner. She lives in a home with her daughter [name]. She recently moved to [place] to live with her daughter. She wears oxygen at night and during the day when up and about. She voiced no  concerns about the program, except what machines will she use and she does not feel she will do well on the treadmill. Sherre should do well during her time here.    Expected Outcomes STG: Attends all scheduled sessions. LTG: Improvement in breathing during daily activities.    Continue Psychosocial Services  Follow up required by staff           Psychosocial Re-Evaluation:   Psychosocial Discharge (Final Psychosocial Re-Evaluation):   Education: Education Goals: Education classes will be provided on a weekly basis, covering required  topics. Participant will state understanding/return demonstration of topics presented.  Learning Barriers/Preferences:  Learning Barriers/Preferences - 02/28/20 1125      Learning Barriers/Preferences   Learning Barriers None    Learning Preferences Audio;Written Material           General Pulmonary Education Topics:  Infection Prevention: - Provides verbal and written material to individual with discussion of infection control including proper hand washing and proper equipment cleaning during exercise session.   Pulmonary Rehab from 04/21/2020 in [facility] Cardiac and Pulmonary Rehab  Date 04/21/20  Educator AS  Instruction Review Code 1- Verbalizes Understanding      Falls Prevention: - Provides verbal and written material to individual with discussion of falls prevention and safety.   Pulmonary Rehab from 04/21/2020 in [facility] Cardiac and Pulmonary Rehab  Date 04/21/20  Educator AS  Instruction Review Code 1- Verbalizes Understanding      Chronic Lung Diseases: - Group verbal and written instruction to review updates, respiratory medications, advancements in procedures and treatments. Discuss use of supplemental oxygen including available portable oxygen systems, continuous and intermittent flow rates, concentrators, personal use and safety guidelines. Review proper use of inhaler and spacers. Provide informative websites for self-education.     Energy Conservation: - Provide group verbal and written instruction for methods to conserve energy, plan and organize activities. Instruct on pacing techniques, use of adaptive equipment and posture/positioning to relieve shortness of breath.   Triggers and Exacerbations: - Group verbal and written instruction to review types of environmental triggers and ways to prevent exacerbations. Discuss weather changes, air quality and the benefits of nasal washing. Review warning signs and symptoms to help prevent infections. Discuss techniques for effective airway clearance, coughing, and vibrations.   AED/CPR: - Group verbal and written instruction with the use of models to demonstrate the basic use of the AED with the basic ABC's of resuscitation.   Anatomy and Physiology of the Lungs: - Group verbal and written instruction with the use of models to provide basic lung anatomy and physiology related to function, structure and complications of lung disease.   Anatomy & Physiology of the Heart: - Group verbal and written instruction and models provide basic cardiac anatomy and physiology, with the coronary electrical and arterial systems. Review of Valvular disease and Heart Failure   Cardiac Medications: - Group verbal and written instruction to review commonly prescribed medications for heart disease. Reviews the medication, class of the drug, and side effects.   Other: -Provides group and verbal instruction on various topics (see comments)   Knowledge Questionnaire Score:  Knowledge Questionnaire Score - 04/21/20 1630      Knowledge Questionnaire Score   Pre Score 9/17            Core Components/Risk Factors/Patient Goals at Admission:  Personal Goals and Risk Factors at Admission - 04/21/20 1623      Core Components/Risk Factors/Patient Goals on Admission    Weight Management Yes    Intervention Weight Management: Develop a combined nutrition and exercise program designed  to reach desired caloric intake, while maintaining appropriate intake of nutrient and fiber, sodium and fats, and appropriate energy expenditure required for the weight goal.;Weight Management: Provide education and appropriate resources to help participant work on and attain dietary goals.    Admit Weight 146 lb 11.2 oz (66.5 kg)    Goal Weight: Short Term 146 lb 11.2 oz (66.5 kg)    Goal Weight: Long Term 146 lb 11.2 oz (66.5 kg)  Expected Outcomes Short Term: Continue to assess and modify interventions until short term weight is achieved;Long Term: Adherence to nutrition and physical activity/exercise program aimed toward attainment of established weight goal;Weight Maintenance: Understanding of the daily nutrition guidelines, which includes 25-35% calories from fat, 7% or less cal from saturated fats, less than 212m cholesterol, less than 1.5gm of sodium, & 5 or more servings of fruits and vegetables daily           Education:Diabetes - Individual verbal and written instruction to review signs/symptoms of diabetes, desired ranges of glucose level fasting, after meals and with exercise. Acknowledge that pre and post exercise glucose checks will be done for 3 sessions at entry of program.   Education: Know Your Numbers and Risk Factors: -Group verbal and written instruction about important numbers in your health.  Discussion of what are risk factors and how they play a role in the disease process.  Review of Cholesterol, Blood Pressure, Diabetes, and BMI and the role they play in your overall health.   Core Components/Risk Factors/Patient Goals Review:    Core Components/Risk Factors/Patient Goals at Discharge (Final Review):    ITP Comments:  ITP Comments    Row Name 02/28/20 1139 04/21/20 1639 05/06/20 1152 05/14/20 1606     ITP Comments Virtual orientation call completed today. She has an appointment on Date: 03/04/2020 for EP eval and gym Orientation.  [facility] of [facility]  [facility]  [facility] Completed 6MWT and gym orientation. Initial ITP created and sent for review to Dr. MEmily Filbert Medical Director. First full day of exercise!  Patient was oriented to gym and equipment including functions, settings, policies, and procedures.  Patient's individual exercise prescription and treatment plan were reviewed.  All starting workloads were established based on the results of the 6 minute walk test done at initial orientation visit.  The plan for exercise progression was also introduced and progression will be customized based on patient's performance and goals. 30 day review completed. ITP sent to Dr. MEmily Filbert Medical Director of Cardiac and Pulmonary Rehab. Continue with ITP unless changes are made by physician.           Comments: 30 day review

## 2020-05-15 ENCOUNTER — Encounter: Payer: Medicare Other | Attending: Pulmonary Disease | Admitting: *Deleted

## 2020-05-15 ENCOUNTER — Other Ambulatory Visit: Payer: Self-pay

## 2020-05-15 DIAGNOSIS — J449 Chronic obstructive pulmonary disease, unspecified: Secondary | ICD-10-CM | POA: Diagnosis not present

## 2020-05-15 DIAGNOSIS — J439 Emphysema, unspecified: Secondary | ICD-10-CM

## 2020-05-15 NOTE — Progress Notes (Signed)
Daily Session Note  Patient Details  Name: Brianna Adams MRN: [id number] Date of Birth: 1941/05/16 Referring Provider:     Pulmonary Rehab from 04/21/2020 in [facility] Cardiac and Pulmonary Rehab  Referring Provider [name]      Encounter Date: 05/15/2020  Check In:  Session Check In - 05/15/20 1118      Check-In   Supervising physician immediately available to respond to emergencies See telemetry face sheet for immediately available ER MD    Location [facility]-Cardiac & Pulmonary Rehab    Staff Present Renita Papa, RN BSN;Joseph [address] [place], [place], [place], CCRP, CCET    Virtual Visit No    Medication changes reported     No    Fall or balance concerns reported    No    Warm-up and Cool-down Performed on first and last piece of equipment    Resistance Training Performed Yes    VAD Patient? No    PAD/SET Patient? No      Pain Assessment   Currently in Pain? No/denies              Social History   Tobacco Use  Smoking Status Former Smoker  . Packs/day: 1.00  . Years: 58.00  . Pack years: 58.00  . Types: Cigarettes  . Quit date: 10/22/2015  . Years since quitting: 4.5  Smokeless Tobacco Never Used    Goals Met:  Independence with exercise equipment Exercise tolerated well No report of cardiac concerns or symptoms Strength training completed today  Goals Unmet:  Not Applicable  Comments: Pt able to follow exercise prescription today without complaint.  Will continue to monitor for progression.    Dr. Emily Filbert is Medical Director for [place] and [name].

## 2020-05-16 ENCOUNTER — Other Ambulatory Visit: Payer: Self-pay | Admitting: Nurse Practitioner

## 2020-05-19 ENCOUNTER — Encounter: Payer: Self-pay | Admitting: Nurse Practitioner

## 2020-05-22 ENCOUNTER — Encounter: Payer: Medicare Other | Admitting: *Deleted

## 2020-05-22 ENCOUNTER — Other Ambulatory Visit: Payer: Self-pay

## 2020-05-22 DIAGNOSIS — J449 Chronic obstructive pulmonary disease, unspecified: Secondary | ICD-10-CM | POA: Diagnosis not present

## 2020-05-22 DIAGNOSIS — J439 Emphysema, unspecified: Secondary | ICD-10-CM

## 2020-05-22 NOTE — Progress Notes (Signed)
Daily Session Note  Patient Details  Name: Brianna Adams MRN: [id number] Date of Birth: October 11, 1940 Referring Provider:     Pulmonary Rehab from 04/21/2020 in [facility] Cardiac and Pulmonary Rehab  Referring Provider [name]      Encounter Date: 05/22/2020  Check In:  Session Check In - 05/22/20 1038      Check-In   Supervising physician immediately available to respond to emergencies See telemetry face sheet for immediately available ER MD    Location [facility]-Cardiac & Pulmonary Rehab    Staff Present Renita Papa, RN Margurite Auerbach, MS Exercise Physiologist;Jessica [place], MA, RCEP, CCRP, CCET;Melissa Stafford RDN, Rowe Pavy, [place], ACSM CEP, Exercise Physiologist    Virtual Visit No    Medication changes reported     No    Fall or balance concerns reported    No    Warm-up and Cool-down Performed on first and last piece of equipment    Resistance Training Performed Yes    VAD Patient? No    PAD/SET Patient? No      Pain Assessment   Currently in Pain? No/denies              Social History   Tobacco Use  Smoking Status Former Smoker  . Packs/day: 1.00  . Years: 58.00  . Pack years: 58.00  . Types: Cigarettes  . Quit date: 10/22/2015  . Years since quitting: 4.5  Smokeless Tobacco Never Used    Goals Met:  Independence with exercise equipment Exercise tolerated well No report of cardiac concerns or symptoms Strength training completed today  Goals Unmet:  Not Applicable  Comments: Pt able to follow exercise prescription today without complaint.  Will continue to monitor for progression.    Dr. Emily Filbert is Medical Director for [place] [name].

## 2020-05-27 ENCOUNTER — Other Ambulatory Visit: Payer: Self-pay

## 2020-05-27 ENCOUNTER — Encounter: Payer: Medicare Other | Admitting: *Deleted

## 2020-05-27 DIAGNOSIS — J439 Emphysema, unspecified: Secondary | ICD-10-CM

## 2020-05-27 DIAGNOSIS — J449 Chronic obstructive pulmonary disease, unspecified: Secondary | ICD-10-CM | POA: Diagnosis not present

## 2020-05-27 NOTE — Progress Notes (Signed)
Daily Session Note  Patient Details  Name: Brianna Adams MRN: [id number] Date of Birth: 1941/04/27 Referring Provider:     Pulmonary Rehab from 04/21/2020 in [facility] Cardiac and Pulmonary Rehab  Referring Provider [name]      Encounter Date: 05/27/2020  Check In:  Session Check In - 05/27/20 1141      Check-In   Supervising physician immediately available to respond to emergencies See telemetry face sheet for immediately available ER MD    Location [facility]-Cardiac & Pulmonary Rehab    Staff Present Heath Lark, RN, BSN, Jacklynn Bue, MS Exercise Physiologist;Amanda Oletta Darter, [place], ACSM CEP, Exercise Physiologist;Joseph Tessie Fass RCP,RRT,BSRT    Virtual Visit No    Medication changes reported     No    Fall or balance concerns reported    No    Warm-up and Cool-down Performed on first and last piece of equipment    Resistance Training Performed Yes    VAD Patient? No    PAD/SET Patient? No      Pain Assessment   Currently in Pain? No/denies              Social History   Tobacco Use  Smoking Status Former Smoker  . Packs/day: 1.00  . Years: 58.00  . Pack years: 58.00  . Types: Cigarettes  . Quit date: 10/22/2015  . Years since quitting: 4.6  Smokeless Tobacco Never Used    Goals Met:  Proper associated with RPD/PD & O2 Sat Independence with exercise equipment Exercise tolerated well No report of cardiac concerns or symptoms  Goals Unmet:  Not Applicable  Comments: Pt able to follow exercise prescription today without complaint.  Will continue to monitor for progression.    Dr. Emily Filbert is Medical Director for [place] and [name].

## 2020-05-29 ENCOUNTER — Encounter: Payer: Medicare Other | Admitting: *Deleted

## 2020-05-29 ENCOUNTER — Other Ambulatory Visit: Payer: Self-pay

## 2020-05-29 DIAGNOSIS — J439 Emphysema, unspecified: Secondary | ICD-10-CM

## 2020-05-29 DIAGNOSIS — J449 Chronic obstructive pulmonary disease, unspecified: Secondary | ICD-10-CM | POA: Diagnosis not present

## 2020-05-29 NOTE — Progress Notes (Signed)
Daily Session Note  Patient Details  Name: Brianna Adams MRN: [id number] Date of Birth: 1941/06/18 Referring Provider:     Pulmonary Rehab from 04/21/2020 in [facility] Cardiac and Pulmonary Rehab  Referring Provider [name]      Encounter Date: 05/29/2020  Check In:  Session Check In - 05/29/20 1136      Check-In   Supervising physician immediately available to respond to emergencies See telemetry face sheet for immediately available ER MD    Location [facility]-Cardiac & Pulmonary Rehab    Staff Present Renita Papa, RN Margurite Auerbach, MS Exercise Physiologist;Jessica Luan [place], MA, RCEP, CCRP, CCET;Melissa [place] RDN, LDN;Joseph [place] [employer]    Virtual Visit No    Medication changes reported     No    Fall or balance concerns reported    No    Warm-up and Cool-down Performed on first and last piece of equipment    Resistance Training Performed Yes    VAD Patient? No    PAD/SET Patient? No      Pain Assessment   Currently in Pain? No/denies              Social History   Tobacco Use  Smoking Status Former Smoker  . Packs/day: 1.00  . Years: 58.00  . Pack years: 58.00  . Types: Cigarettes  . Quit date: 10/22/2015  . Years since quitting: 4.6  Smokeless Tobacco Never Used    Goals Met:  Independence with exercise equipment Exercise tolerated well No report of cardiac concerns or symptoms Strength training completed today  Goals Unmet:  Not Applicable  Comments: Pt able to follow exercise prescription today without complaint.  Will continue to monitor for progression.    Dr. Emily Filbert is Medical Director for [place] and [name].

## 2020-06-02 ENCOUNTER — Other Ambulatory Visit: Payer: Self-pay

## 2020-06-02 ENCOUNTER — Ambulatory Visit (INDEPENDENT_AMBULATORY_CARE_PROVIDER_SITE_OTHER): Payer: Medicare Other | Admitting: Primary Care

## 2020-06-02 ENCOUNTER — Encounter: Payer: Self-pay | Admitting: Primary Care

## 2020-06-02 DIAGNOSIS — J449 Chronic obstructive pulmonary disease, unspecified: Secondary | ICD-10-CM

## 2020-06-02 DIAGNOSIS — R911 Solitary pulmonary nodule: Secondary | ICD-10-CM

## 2020-06-02 DIAGNOSIS — J9611 Chronic respiratory failure with hypoxia: Secondary | ICD-10-CM | POA: Diagnosis not present

## 2020-06-02 NOTE — Progress Notes (Signed)
_0  ID: Brianna Adams, female    DOB: May 19, 1941, 79 y.o.   MRN: [id number]  Chief Complaint  Patient presents with  . Follow-up    Patient reports still having SOB with exertion. Patient denies coughing or wheezing. Patient wears 3L oxygen continuously.    Referring provider: [name], [name]  HPI: 79 year old female, former smoker quit 2017.  Past medical history significant for chronic obstructive pulmonary disease, pulmonary hypertension, diabetes type 2, squamous cell carcinoma of the lung, diastolic heart failure, GERD.  Patient of Dr. Patsey Adams, last seen  last seen in office on 04/09/2020.  Pulmonary function test done in 2019 showed no obvious signs of obstruction or restriction.  Post -bronchodilator FEV1 1.73 (93%), ratio 74. Ordered for CT chest, given trial of Anoro Ellipta.  Recommended follow-up in 2 months  Previous [facility] pulmonary encounters: 02/18/2020 Patient presents today for regular follow-up.  CT chest obtained in January 2021 showed emphysema, no convincing evidence of tumor re-occurance. She did have a small mediastinal nodule that is stable versus slightly increased from her prior PET.   Accompanied by her daughter. She did not notice a significant improvement in her breathing on Anoro but does prefer it to Spiriva. She would like a refer to pulmonary rehab. She has home oxygen, she wears 1L at night. She has portable oxygen machines which she uses when she needs. Her O2 level was 86% today on room air which came up to 95% on 2L. She does not have a pulse oximeter at home. Her POC is in the car today. She reports decrease in her endurance, states that when she even walks a short distance she will get out of breath. Daughter states that she is fairly physically fit and tends to walk very fast then becomes short of breath. She takes omeprazole 47m in the morning for reflux. She is on Janumet for her diabetes, glimepiride was discontinued. Denies chest tightness, wheezing  or cough. She has had both COVID vaccine.   04/09/20- Dr. 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  She then underwent radiation therapy to a new left upper lobe nodule that was PET positive.  Patient previously was followed here by Dr. DMerton BorderThe patient most recently had been seen on 18 February 2020 of up repeat CT chest was ordered.  She had had no convincing evidence of recurrence on a CT of January 2021.  The patient also during that visit was scheduled for pulmonary rehabilitation.  However [facility].  [place].  Evaluation of the emergency room revealed the patient had pulmonary emboli and right heart strain.  She was noted also to require 6 L of oxygen.  Aside from pulmonary embolism it was also suspected the patient had pneumonia.  Performed on 26 June showed ejection fraction of 55 to 60%, reduced right ventricular systolic function with an large right ventricular size and severely elevated pulmonary artery systolic pressure.  She also did have moderate mitral valve regurgitation.  She has been noted to have a systolic murmur on prior physical exam.  2D echo since that time.  She has been released by cardiology who followed her transiently for elevated troponins  during her admission.  Patient she also was noted to have decompensation of diastolic congestive heart failure.  Since her release from the hospital she has felt very short of breath.  She is on 2 L pulsed of oxygen which she feels is inadequate when she exerts herself.  She had pulmonary function testing performed on15 July 2021, as previous, lung volumes were invalid.  She has persistently decreased DLCO at  49%, unchanged from prior no overt obstruction however there is significant bronchodilator response.  Testing limited by patient's inability to perform the test well.  Patient would like to be referred to pulmonary rehab.  Trial Trelegy on hold given hx severe pulmonary HTN until 2D echo  06/02/2020- Interim hx Patient presents today for 6 week follow-up. PE in June, on anticoagulation with Eliquis (will likely need lifelong). 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 Previous PFT showed component of reversibility, would likely benefit from inhaled corticosteriods.  Trial Trelegy was put on hold d/t pulmonary HTN until after echo completed. She was ordered for repeat CT chest d/t lymphadenopathy after pulmonary embolism. Continues 3L oxygen.   She continues to experience dyspnea on exertion. She feels she has improved some. She currently attending pulmonary rehab. She is still using Anoro 1 puff daily. She has not required her rescue inhaler. Complaint with blood thinner, taking Eliquis 2m twice daily. She had previously been inactive prior to developing PE. They were on a trip and she was using her wheelchair more than usual. Activity level has improved, she exercises every day. She has a gaglion cyst on her left wrist, scheduled for surgery.   She has not had repeat CT chest that was ordered. Echocardiogram appears slightly improved from previous; her ejection fraction remains baseline 50-55%, grade 1 DD. Moderate elevated pulmonary artery systolic pressure. Right ventricular systolic function not well visualized, normal size. Denies fever, chills, cough, wheezing or chest tightness.   Imaging: 12/09/19 CT chest:  Moderate centrilobular and paraseptal emphysematous changes, upper lung predominant. Small mediastinal nodules, including 140mshort axis AP window node, stable versus minimally increased from PET. No convincing findings to suggest tumor  recurrence.   Allergies  Allergen Reactions  . Penicillin G Shortness Of Breath    Other reaction(s): Rash  . Ace Inhibitors Swelling  . Atorvastatin Other (See Comments)    Other reaction(s): Myalgias (Muscle Pain)   . Cortisone Other (See Comments)    Other reaction(s): Patient not sure  . Hydrocortisone Other (See Comments)    unknown  . Lisinopril Other (See Comments)    Other reaction(s): Angioedema  . Meloxicam Other (See Comments)    Bloody stools  . Penicillins Hives    60 years ago  . Pravastatin Other (See Comments)     Chest Pain    Immunization History  Administered Date(s) Administered  . Fluad Quad(high Dose 65+) 05/24/2019, 05/13/2020  . Influenza-Unspecified 06/06/2018  . PFIZER SARS-COV-2 Vaccination 10/20/2019, 11/19/2019  . Pneumococcal Conjugate-13 10/31/2013  . Pneumococcal Polysaccharide-23 11/05/2007  . Td 09/07/2003  . Tdap 08/17/2017  . Zoster 11/05/2010  . Zoster Recombinat (Shingrix) 08/17/2017, 06/28/2019, 09/05/2019    Past Medical History:  Diagnosis Date  . CHF (congestive heart failure) ([facility]  . COPD (chronic obstructive pulmonary disease) ([facility]  . Diabetes ([facility]  . Diverticulitis   . High cholesterol   . Lung cancer ([facility]  . Pulmonary hypertension ([facility]  . Uterine cancer ([facility]  Tobacco History: Social History   Tobacco Use  Smoking Status Former Smoker  . Packs/day: 1.00  . Years: 58.00  . Pack years: 58.00  . Types: Cigarettes  . Quit date: 10/22/2015  . Years since quitting: 4.6  Smokeless Tobacco Never Used   Counseling given: Not Answered   Outpatient Medications Prior to Visit  Medication Sig Dispense Refill  . acetaminophen (TYLENOL) 500 MG tablet Take 1,000 mg by mouth every 6 (six) hours as needed for mild pain or fever.     Marland Kitchen albuterol (VENTOLIN HFA) 108 (90 Base) MCG/ACT inhaler Inhale 2 puffs into the lungs every 6 (six) hours as needed for wheezing or shortness of breath. 18 g 0  . calcium  carbonate (OSCAL) 1500 (600 Ca) MG TABS tablet Take 1,200 mg by mouth daily.    . Cinnamon 500 MG TABS Take 1,000 mg by mouth daily.     Marland Kitchen ELIQUIS 5 MG TABS tablet TWO TABS TWICE A DAY FOR 6 MORE DAYS THEN ONE TABLET TWICE A DAY (Patient taking differently: Take 5 mg by mouth 2 (two) times daily. ) 72 tablet 0  . furosemide (LASIX) 20 MG tablet TAKE 1 TABLET BY MOUTH EVERY DAY (Patient taking differently: Take 20 mg by mouth daily. ) 90 tablet 2  . metFORMIN (GLUCOPHAGE) 1000 MG tablet Take 1 tablet (1,000 mg total) by mouth 2 (two) times daily with a meal. 180 tablet 0  . Multiple Vitamins-Minerals (CENTRUM SILVER PO) Take 1 tablet by mouth daily.     Marland Kitchen omeprazole (PRILOSEC) 40 MG capsule Take 1 capsule (40 mg total) by mouth daily. 90 capsule 1  . pravastatin (PRAVACHOL) 10 MG tablet TAKE 1 TABLET BY MOUTH EVERY DAY (Patient taking differently: Take 10 mg by mouth daily. ) 90 tablet 1  . umeclidinium-vilanterol (ANORO ELLIPTA) 62.5-25 MCG/INH AEPB Inhale 1 puff into the lungs daily.     . vitamin C (ASCORBIC ACID) 500 MG tablet Take 500 mg by mouth daily.      No facility-administered medications prior to visit.    Review of Systems  Review of Systems  Constitutional: Negative.   Respiratory: Negative for cough, chest tightness, shortness of breath and wheezing.        DOE  Cardiovascular: Negative.     Physical Exam  BP 124/68 (BP Location: Right Arm, Patient Position: Sitting, Cuff Size: Normal)   Pulse 92   Temp (!) 97.3 F (36.3 C) (Temporal)   Ht 5' 2.75" (1.594 m)   Wt 148 lb 12.8 oz (67.5 kg)   SpO2 93%   BMI 26.57 kg/m  Physical Exam Constitutional:      Appearance: Normal appearance.  HENT:     Head: Normocephalic and atraumatic.     Mouth/Throat:     Mouth: Mucous membranes are moist.     Pharynx: Oropharynx is clear.  Cardiovascular:     Rate and Rhythm: Normal rate and regular rhythm.     Comments: No edema Pulmonary:     Effort: Pulmonary effort is normal.      Breath sounds: Normal breath sounds. No wheezing or rhonchi.     Comments: CTA Musculoskeletal:     Cervical back: Normal range of motion and neck supple.     Comments: In transfer WC  Skin:    Comments: Ganglion cyst left wrist  Neurological:     General: No focal deficit present.     Mental Status: She is alert and oriented to person, place, and time.  Mental status is at baseline.  Psychiatric:        Mood and Affect: Mood normal.        Behavior: Behavior normal.        Thought Content: Thought content normal.        Judgment: Judgment normal.      Lab Results:  CBC    Component Value Date/Time   WBC 5.2 03/03/2020 0717   RBC 4.13 03/03/2020 0717   HGB 11.2 (L) 03/03/2020 0717   HCT 35.0 (L) 03/03/2020 0717   PLT 248 03/03/2020 0717   MCV 84.7 03/03/2020 0717   MCH 27.1 03/03/2020 0717   MCHC 32.0 03/03/2020 0717   RDW 16.7 (H) 03/03/2020 0717   LYMPHSABS 0.9 02/29/2020 1304   MONOABS 0.8 02/29/2020 1304   EOSABS 0.0 02/29/2020 1304   BASOSABS 0.1 02/29/2020 1304    BMET    Component Value Date/Time   NA 140 05/13/2020 1043   K 4.5 05/13/2020 1043   CL 103 05/13/2020 1043   CO2 20 05/13/2020 1043   GLUCOSE 127 (H) 05/13/2020 1043   GLUCOSE 147 (H) 03/01/2020 0458   BUN 11 05/13/2020 1043   CREATININE 0.80 05/13/2020 1043   CALCIUM 9.5 05/13/2020 1043   GFRNONAA 70 05/13/2020 1043   GFRAA 81 05/13/2020 1043    BNP    Component Value Date/Time   BNP 1,093.5 (H) 03/01/2020 0458    ProBNP No results found for: PROBNP  Imaging: No results found.   Assessment & Plan:   Chronic obstructive pulmonary disease ([place]) - Stable; Emphysema on imaging, No obvious signs of obstruction on PFTs.  - Continue Anoro 1 puff daily in the morning - Use albuterol rescue inhaler 2 puffs every 4-6 hours as needed for breakthrough shortness of breath/wheezing - Continue pulmonary rehab  - 3 months with Dr. Patsey Adams   Chronic respiratory failure with hypoxia  ([place]) - Continues to benefit from o2 3L/min   Pulmonary nodule -CT in January 2021 showed no convincing evidence of tumor re-occurrence. It did show small mediastinal nodule that is stable versus slight increased from prior PET. Needs repeat CT chest      Martyn Ehrich, NP 06/16/2020

## 2020-06-02 NOTE — Patient Instructions (Addendum)
Recommendations: Continue Anoro 1 puff daily in the morning Use albuterol rescue inhaler 2 puffs every 4-6 hours as needed for breakthrough shortness of breath/wheezing Continue 3L oxygen 24/7 Continue pulmonary rehab   Orders: Needs to schedule CT chest   Follow-up: 3 months with Dr. [name]

## 2020-06-03 ENCOUNTER — Encounter: Payer: Medicare Other | Admitting: *Deleted

## 2020-06-03 DIAGNOSIS — J449 Chronic obstructive pulmonary disease, unspecified: Secondary | ICD-10-CM | POA: Diagnosis not present

## 2020-06-03 DIAGNOSIS — J439 Emphysema, unspecified: Secondary | ICD-10-CM

## 2020-06-03 LAB — GLUCOSE, CAPILLARY
Glucose-Capillary: 195 mg/dL — ABNORMAL HIGH (ref 70–99)
Glucose-Capillary: 88 mg/dL (ref 70–99)

## 2020-06-03 NOTE — Progress Notes (Signed)
Daily Session Note  Patient Details  Name: Brianna Adams MRN: [id number] Date of Birth: 1941/08/09 Referring Provider:     Pulmonary Rehab from 04/21/2020 in [facility] Cardiac and Pulmonary Rehab  Referring Provider [name]      Encounter Date: 06/03/2020  Check In:  Session Check In - 06/03/20 1246      Check-In   Supervising physician immediately available to respond to emergencies See telemetry face sheet for immediately available ER MD    Location [facility]-Cardiac & Pulmonary Rehab    Staff Present Darel Hong, RN Vickki Hearing, BA, ACSM CEP, Exercise Physiologist;Melissa Caiola RDN, LDN;Casimira Sutphin, RN, BSN, CCRP    Virtual Visit No    Medication changes reported     No    Fall or balance concerns reported    No    Warm-up and Cool-down Performed on first and last piece of equipment    Resistance Training Performed Yes    VAD Patient? No    PAD/SET Patient? No      Pain Assessment   Currently in Pain? No/denies              Social History   Tobacco Use  Smoking Status Former Smoker  . Packs/day: 1.00  . Years: 58.00  . Pack years: 58.00  . Types: Cigarettes  . Quit date: 10/22/2015  . Years since quitting: 4.6  Smokeless Tobacco Never Used    Goals Met:  Proper associated with RPD/PD & O2 Sat Independence with exercise equipment Exercise tolerated well No report of cardiac concerns or symptoms  Goals Unmet:  Not Applicable  Comments: Pt able to follow exercise prescription today without complaint.  Will continue to monitor for progression.    Dr. Emily Filbert is Medical Director for [place] and [name].

## 2020-06-05 ENCOUNTER — Encounter: Payer: Medicare Other | Admitting: *Deleted

## 2020-06-05 ENCOUNTER — Other Ambulatory Visit: Payer: Self-pay

## 2020-06-05 DIAGNOSIS — J449 Chronic obstructive pulmonary disease, unspecified: Secondary | ICD-10-CM | POA: Diagnosis not present

## 2020-06-05 DIAGNOSIS — J439 Emphysema, unspecified: Secondary | ICD-10-CM

## 2020-06-05 LAB — GLUCOSE, CAPILLARY
Glucose-Capillary: 184 mg/dL — ABNORMAL HIGH (ref 70–99)
Glucose-Capillary: 113 mg/dL — ABNORMAL HIGH (ref 70–99)

## 2020-06-05 NOTE — Progress Notes (Signed)
Daily Session Note  Patient Details  Name: Brianna Adams MRN: [id number] Date of Birth: Nov 05, 1940 Referring Provider:     Pulmonary Rehab from 04/21/2020 in [facility] Cardiac and Pulmonary Rehab  Referring Provider [name]      Encounter Date: 06/05/2020  Check In:  Session Check In - 06/05/20 1126      Check-In   Supervising physician immediately available to respond to emergencies See telemetry face sheet for immediately available ER MD    Location [facility]-Cardiac & Pulmonary Rehab    Staff Present Renita Papa, RN BSN;Joseph Lou Miner, [place] Exercise Physiologist;Melissa [place] RDN, Rowe Pavy, BA, ACSM CEP, Exercise Physiologist    Virtual Visit No    Medication changes reported     No    Fall or balance concerns reported    No    Warm-up and Cool-down Performed on first and last piece of equipment    Resistance Training Performed Yes    VAD Patient? No    PAD/SET Patient? No      Pain Assessment   Currently in Pain? No/denies              Social History   Tobacco Use  Smoking Status Former Smoker  . Packs/day: 1.00  . Years: 58.00  . Pack years: 58.00  . Types: Cigarettes  . Quit date: 10/22/2015  . Years since quitting: 4.6  Smokeless Tobacco Never Used    Goals Met:  Independence with exercise equipment Exercise tolerated well No report of cardiac concerns or symptoms Strength training completed today  Goals Unmet:  Not Applicable  Comments: Pt able to follow exercise prescription today without complaint.  Will continue to monitor for progression.    Dr. Emily Filbert is Medical Director for [place] and [name].

## 2020-06-06 ENCOUNTER — Other Ambulatory Visit (HOSPITAL_COMMUNITY): Payer: Medicare Other

## 2020-06-10 ENCOUNTER — Encounter (HOSPITAL_COMMUNITY): Admission: RE | Payer: Self-pay | Source: Home / Self Care

## 2020-06-10 ENCOUNTER — Encounter: Payer: Medicare Other | Attending: Pulmonary Disease | Admitting: *Deleted

## 2020-06-10 ENCOUNTER — Ambulatory Visit (HOSPITAL_COMMUNITY): Admission: RE | Admit: 2020-06-10 | Payer: Medicare Other | Source: Home / Self Care | Admitting: Orthopedic Surgery

## 2020-06-10 ENCOUNTER — Other Ambulatory Visit: Payer: Self-pay

## 2020-06-10 DIAGNOSIS — J439 Emphysema, unspecified: Secondary | ICD-10-CM | POA: Insufficient documentation

## 2020-06-10 SURGERY — EXCISION, GANGLION CYST, WRIST
Anesthesia: Choice | Site: Wrist | Laterality: Left

## 2020-06-10 NOTE — Progress Notes (Signed)
Daily Session Note  Patient Details  Name: Brianna Adams MRN: [id number] Date of Birth: Oct 18, 1940 Referring Provider:     Pulmonary Rehab from 04/21/2020 in [facility] Cardiac and Pulmonary Rehab  Referring Provider [name]      Encounter Date: 06/10/2020  Check In:  Session Check In - 06/10/20 1607      Check-In   Supervising physician immediately available to respond to emergencies See telemetry face sheet for immediately available ER MD    Location [facility]-Cardiac & Pulmonary Rehab    Staff Present Heath Lark, RN, BSN, CCRP;Ivori [place], MA, RCEP, CCRP, CCET;Joseph [employer], [place], ACSM CEP, Exercise Physiologist    Virtual Visit No    Medication changes reported     No    Fall or balance concerns reported    No    Warm-up and Cool-down Performed on first and last piece of equipment    Resistance Training Performed Yes    VAD Patient? No    PAD/SET Patient? No      Pain Assessment   Currently in Pain? No/denies              Social History   Tobacco Use  Smoking Status Former Smoker  . Packs/day: 1.00  . Years: 58.00  . Pack years: 58.00  . Types: Cigarettes  . Quit date: 10/22/2015  . Years since quitting: 4.6  Smokeless Tobacco Never Used    Goals Met:  Proper associated with RPD/PD & O2 Sat Independence with exercise equipment Exercise tolerated well No report of cardiac concerns or symptoms Strength training completed today  Goals Unmet:  Not Applicable  Comments: Pt able to follow exercise prescription today without complaint.  Will continue to monitor for progression.    Dr. Emily Filbert is Medical Director for [place] and [name].

## 2020-06-11 ENCOUNTER — Encounter: Payer: Self-pay | Admitting: *Deleted

## 2020-06-11 DIAGNOSIS — J439 Emphysema, unspecified: Secondary | ICD-10-CM

## 2020-06-11 NOTE — Progress Notes (Signed)
Pulmonary Individual Treatment Plan  Patient Details  Name: Brianna Adams MRN: [id number] Date of Birth: [birth year] Referring Provider:     Pulmonary Rehab from 04/21/2020 in [facility] Cardiac and Pulmonary Rehab  Referring Provider [name]      Initial Encounter Date:    Pulmonary Rehab from 04/21/2020 in [facility] Cardiac and Pulmonary Rehab  Date 04/21/20      Visit Diagnosis: Pulmonary emphysema, unspecified emphysema type ([place])  Patient's Home Medications on Admission:  Current Outpatient Medications:  .  acetaminophen (TYLENOL) 500 MG tablet, Take 1,000 mg by mouth every 6 (six) hours as needed for mild pain or fever. , Disp: , Rfl:  .  albuterol (VENTOLIN HFA) 108 (90 Base) MCG/ACT inhaler, Inhale 2 puffs into the lungs every 6 (six) hours as needed for wheezing or shortness of breath., Disp: 18 g, Rfl: 0 .  calcium carbonate (OSCAL) 1500 (600 Ca) MG TABS tablet, Take 1,200 mg by mouth daily., Disp: , Rfl:  .  Cinnamon 500 MG TABS, Take 1,000 mg by mouth daily. , Disp: , Rfl:  .  ELIQUIS 5 MG TABS tablet, TWO TABS TWICE A DAY FOR 6 MORE DAYS THEN ONE TABLET TWICE A DAY (Patient taking differently: Take 5 mg by mouth 2 (two) times daily. ), Disp: 72 tablet, Rfl: 0 .  furosemide (LASIX) 20 MG tablet, TAKE 1 TABLET BY MOUTH EVERY DAY (Patient taking differently: Take 20 mg by mouth daily. ), Disp: 90 tablet, Rfl: 2 .  metFORMIN (GLUCOPHAGE) 1000 MG tablet, Take 1 tablet (1,000 mg total) by mouth 2 (two) times daily with a meal., Disp: 180 tablet, Rfl: 0 .  Multiple Vitamins-Minerals (CENTRUM SILVER PO), Take 1 tablet by mouth daily. , Disp: , Rfl:  .  omeprazole (PRILOSEC) 40 MG capsule, Take 1 capsule (40 mg total) by mouth daily., Disp: 90 capsule, Rfl: 1 .  pravastatin (PRAVACHOL) 10 MG tablet, TAKE 1 TABLET BY MOUTH EVERY DAY (Patient taking differently: Take 10 mg by mouth daily. ), Disp: 90 tablet, Rfl: 1 .  umeclidinium-vilanterol (ANORO ELLIPTA) 62.5-25 MCG/INH AEPB, Inhale 1 puff into  the lungs daily. , Disp: , Rfl:  .  vitamin C (ASCORBIC ACID) 500 MG tablet, Take 500 mg by mouth daily. , Disp: , Rfl:   Past Medical History: Past Medical History:  Diagnosis Date  . CHF (congestive heart failure) ([place])   . COPD (chronic obstructive pulmonary disease) ([place])   . Diabetes ([name])   . Diverticulitis   . High cholesterol   . Lung cancer ([place])   . Pulmonary hypertension ([place])   . Uterine cancer ([place])     Tobacco Use: Social History   Tobacco Use  Smoking Status Former Smoker  . Packs/day: 1.00  . Years: 58.00  . Pack years: 58.00  . Types: Cigarettes  . Quit date: 10/22/2015  . Years since quitting: 4.6  Smokeless Tobacco Never Used    Labs: Recent Review Flowsheet Data    Labs for ITP Cardiac and Pulmonary Rehab Latest Ref Rng & Units 06/14/2019 10/04/2019 01/07/2020 02/29/2020 05/13/2020   Cholestrol 100 - 199 mg/dL - 152 149 - 168   LDLCALC 0 - 99 mg/dL - 80 73 - 94   HDL >39 mg/dL - 57 61 - 61   Trlycerides 0 - 149 mg/dL - 76 80 - 67   Hemoglobin A1c 4.8 - 5.6 % 6.3(H) 6.2(H) 5.8(H) - 6.0(H)   HCO3 20.0 - 28.0 mmol/L - - - 19.0(L) -   ACIDBASEDEF 0.0 -  2.0 mmol/L - - - 4.0(H) -   O2SAT % - - - 99.6 -       Pulmonary Assessment Scores:  Pulmonary Assessment Scores    Row Name 04/21/20 1628         ADL UCSD   SOB Score total 68     Rest 0     Walk 3     Stairs 4     Bath 2     Dress 1     Shop 3       CAT Score   CAT Score 22       mMRC Score   mMRC Score 2            UCSD: Self-administered rating of dyspnea associated with activities of daily living (ADLs) 6-point scale (0 = "not at all" to 5 = "maximal or unable to do because of breathlessness")  Scoring Scores range from 0 to 120.  Minimally important difference is 5 units  CAT: CAT can identify the health impairment of COPD patients and is better correlated with disease progression.  CAT has a scoring range of zero to 40. The CAT score is classified into four groups of low (less  than 10), medium (10 - 20), high (21-30) and very high (31-40) based on the impact level of disease on health status. A CAT score over 10 suggests significant symptoms.  A worsening CAT score could be explained by an exacerbation, poor medication adherence, poor inhaler technique, or progression of COPD or comorbid conditions.  CAT MCID is 2 points  mMRC: mMRC (Modified Medical Research Council) Dyspnea Scale is used to assess the degree of baseline functional disability in patients of respiratory disease due to dyspnea. No minimal important difference is established. A decrease in score of 1 point or greater is considered a positive change.   Pulmonary Function Assessment:   Exercise Target Goals: Exercise Program Goal: Individual exercise prescription set using results from initial 6 min walk test and THRR while considering  patient's activity barriers and safety.   Exercise Prescription Goal: Initial exercise prescription builds to 30-45 minutes a day of aerobic activity, 2-3 days per week.  Home exercise guidelines will be given to patient during program as part of exercise prescription that the participant will acknowledge.  Education: Aerobic Exercise & Resistance Training: - Gives group verbal and written instruction on the various components of exercise. Focuses on aerobic and resistive training programs and the benefits of this training and how to safely progress through these programs..   Education: Exercise & Equipment Safety: - Individual verbal instruction and demonstration of equipment use and safety with use of the equipment.   Pulmonary Rehab from 06/05/2020 in [facility] Cardiac and Pulmonary Rehab  Date 04/21/20  Educator AS  Instruction Review Code 1- Verbalizes Understanding      Education: Exercise Physiology & General Exercise Guidelines: - Group verbal and written instruction with models to review the exercise physiology of the cardiovascular system and associated  critical values. Provides general exercise guidelines with specific guidelines to those with heart or lung disease.    Education: Flexibility, Balance, Mind/Body Relaxation: Provides group verbal/written instruction on the benefits of flexibility and balance training, including mind/body exercise modes such as yoga, pilates and tai chi.  Demonstration and skill practice provided.   Activity Barriers & Risk Stratification:  Activity Barriers & Cardiac Risk Stratification - 02/28/20 1107      Activity Barriers & Cardiac Risk Stratification   Activity Barriers  None           6 Minute Walk:  6 Minute Walk    Row Name 04/21/20 1609         6 Minute Walk   Phase Initial     Distance 380 feet     Walk Time 4.5 minutes     # of Rest Breaks 3     MPH 0.95     METS 0.8     RPE 11     Perceived Dyspnea  2     VO2 Peak 2.75     Symptoms No     Resting HR 85 bpm     Resting BP 120/60     Resting Oxygen Saturation  98 %     Exercise Oxygen Saturation  during 6 min walk 90 %     Max Ex. HR 103 bpm     Max Ex. BP 134/66     2 Minute Post BP 130/62       Interval HR   1 Minute HR 102     2 Minute HR 93     3 Minute HR 100     4 Minute HR 100     5 Minute HR 101     6 Minute HR 103     2 Minute Post HR 82     Interval Heart Rate? Yes       Interval Oxygen   Interval Oxygen? Yes     Baseline Oxygen Saturation % 98 %     1 Minute Oxygen Saturation % 92 %     1 Minute Liters of Oxygen 3 L     2 Minute Oxygen Saturation % 90 %     2 Minute Liters of Oxygen 3 L     3 Minute Oxygen Saturation % 94 %     3 Minute Liters of Oxygen 3 L     4 Minute Oxygen Saturation % 90 %     4 Minute Liters of Oxygen 3 L     5 Minute Oxygen Saturation % 93 %     5 Minute Liters of Oxygen 3 L     6 Minute Oxygen Saturation % 93 %     6 Minute Liters of Oxygen 3 L     2 Minute Post Oxygen Saturation % 98 %     2 Minute Post Liters of Oxygen 3 L           Oxygen Initial Assessment:   Oxygen Initial Assessment - 05/06/20 1152      Home Oxygen   Home Oxygen Device Portable Concentrator;Home Concentrator    Sleep Oxygen Prescription Continuous    Liters per minute 2    Home Exercise Oxygen Prescription None    Home Resting Oxygen Prescription Continuous    Liters per minute -1    Compliance with Home Oxygen Use Yes      Intervention   Short Term Goals To learn and exhibit compliance with exercise, home and travel O2 prescription;To learn and understand importance of monitoring SPO2 with pulse oximeter and demonstrate accurate use of the pulse oximeter.;To learn and understand importance of maintaining oxygen saturations>88%;To learn and demonstrate proper pursed lip breathing techniques or other breathing techniques.;To learn and demonstrate proper use of respiratory medications    Long  Term Goals Verbalizes importance of monitoring SPO2 with pulse oximeter and return demonstration;Exhibits proper breathing techniques, such as pursed lip breathing or other method taught during  program session;Maintenance of O2 saturations>88%;Exhibits compliance with exercise, home and travel O2 prescription;Demonstrates proper use of MDI's;Compliance with respiratory medication           Oxygen Re-Evaluation:  Oxygen Re-Evaluation    [place] Name 05/06/20 1153 05/29/20 1137           Program Oxygen Prescription   Program Oxygen Prescription -- Continuous;E-Tanks      Liters per minute -- 3        Home Oxygen   Home Oxygen Device -- Home Concentrator;Portable Concentrator;E-Tanks      Sleep Oxygen Prescription -- Continuous      Liters per minute -- 3      Home Exercise Oxygen Prescription -- Continuous      Liters per minute -- 3      Home Resting Oxygen Prescription -- Continuous      Liters per minute -- 3      Compliance with Home Oxygen Use -- Yes        Goals/Expected Outcomes   Short Term Goals To learn and demonstrate proper pursed lip breathing techniques or other  breathing techniques. To learn and exhibit compliance with exercise, home and travel O2 prescription;To learn and understand importance of monitoring SPO2 with pulse oximeter and demonstrate accurate use of the pulse oximeter.;To learn and understand importance of maintaining oxygen saturations>88%;To learn and demonstrate proper use of respiratory medications;To learn and demonstrate proper pursed lip breathing techniques or other breathing techniques.      Long  Term Goals Exhibits proper breathing techniques, such as pursed lip breathing or other method taught during program session Exhibits compliance with exercise, home and travel O2 prescription;Exhibits proper breathing techniques, such as pursed lip breathing or other method taught during program session;Verbalizes importance of monitoring SPO2 with pulse oximeter and return demonstration;Demonstrates proper use of MDI's;Maintenance of O2 saturations>88%;Compliance with respiratory medication      Comments Reviewed PLB technique with pt.  Talked about how it works and it's importance in maintaining their exercise saturations. Informed patient how to perform the Pursed Lipped breathing technique. Told patient to Inhale through the nose and out the mouth with pursed lips to keep their airways open, help oxygenate them better, practice when at rest or doing strenuous activity. Patient Verbalizes understanding of technique and will work on and be reiterated during [place].      Goals/Expected Outcomes Short: Become more profiecient at using PLB.   Long: Become independent at using PLB. Short: use PLB with exertion. Long: use PLB on exertion proficiently and independently             Oxygen Discharge (Final Oxygen Re-Evaluation):  Oxygen Re-Evaluation - 05/29/20 1137      Program Oxygen Prescription   Program Oxygen Prescription Continuous;E-Tanks    Liters per minute 3      Home Oxygen   Home Oxygen Device Home Concentrator;Portable  Concentrator;E-Tanks    Sleep Oxygen Prescription Continuous    Liters per minute 3    Home Exercise Oxygen Prescription Continuous    Liters per minute 3    Home Resting Oxygen Prescription Continuous    Liters per minute 3    Compliance with Home Oxygen Use Yes      Goals/Expected Outcomes   Short Term Goals To learn and exhibit compliance with exercise, home and travel O2 prescription;To learn and understand importance of monitoring SPO2 with pulse oximeter and demonstrate accurate use of the pulse oximeter.;To learn and understand importance of maintaining oxygen saturations>88%;To learn  and demonstrate proper use of respiratory medications;To learn and demonstrate proper pursed lip breathing techniques or other breathing techniques.    Long  Term Goals Exhibits compliance with exercise, home and travel O2 prescription;Exhibits proper breathing techniques, such as pursed lip breathing or other method taught during program session;Verbalizes importance of monitoring SPO2 with pulse oximeter and return demonstration;Demonstrates proper use of MDI's;Maintenance of O2 saturations>88%;Compliance with respiratory medication    Comments Informed patient how to perform the Pursed Lipped breathing technique. Told patient to Inhale through the nose and out the mouth with pursed lips to keep their airways open, help oxygenate them better, practice when at rest or doing strenuous activity. Patient Verbalizes understanding of technique and will work on and be reiterated during [place].    Goals/Expected Outcomes Short: use PLB with exertion. Long: use PLB on exertion proficiently and independently           Initial Exercise Prescription:  Initial Exercise Prescription - 04/21/20 1600      Date of Initial Exercise RX and Referring Provider   Date 04/21/20    Referring Provider [name]      Treadmill   MPH 0.8    Grade 0    Minutes 15    METs 1      Recumbant Bike   Level 1    RPM 60     Watts 5    Minutes 15    METs 1      NuStep   Level 1    SPM 80    Minutes 15    METs 1      Arm Ergometer   Level 1    RPM 25    Minutes 15    METs 1      Recumbant Elliptical   Level 1    RPM 30    Minutes 15    METs 1      Prescription Details   Frequency (times per week) 3    Duration Progress to 30 minutes of continuous aerobic without signs/symptoms of physical distress      Intensity   THRR 40-80% of Max Heartrate 107-129    Ratings of Perceived Exertion 11-13    Perceived Dyspnea 0-4      Resistance Training   Training Prescription Yes    Weight 2 lb    Reps 10-15           Perform Capillary Blood Glucose checks as needed.  Exercise Prescription Changes:  Exercise Prescription Changes    Row Name 04/21/20 1600 05/06/20 1500 05/21/20 1000 06/03/20 0900       Response to Exercise   Blood Pressure (Admit) 120/60 124/64 122/60 142/74    Blood Pressure (Exercise) 134/66 128/70 146/64 146/70    Blood Pressure (Exit) 130/62 122/62 138/70 100/58    Heart Rate (Admit) 85 bpm 97 bpm 84 bpm 73 bpm    Heart Rate (Exercise) 103 bpm 100 bpm 114 bpm 108 bpm    Heart Rate (Exit) 82 bpm 90 bpm 95 bpm 82 bpm    Oxygen Saturation (Admit) 98 % 89 % 99 % 100 %    Oxygen Saturation (Exercise) 90 % 96 % 92 % 92 %    Oxygen Saturation (Exit) 98 % 99 % 98 % 95 %    Rating of Perceived Exertion (Exercise) _0 Perceived Dyspnea (Exercise) _1 Symptoms none none none none    Comments --  first day -- --    Duration -- Progress to 30 minutes of  aerobic without signs/symptoms of physical distress Progress to 30 minutes of  aerobic without signs/symptoms of physical distress Progress to 30 minutes of  aerobic without signs/symptoms of physical distress    Intensity -- -- THRR unchanged THRR unchanged      Progression   Progression -- Continue to progress workloads to maintain intensity without signs/symptoms of physical distress. Continue to progress  workloads to maintain intensity without signs/symptoms of physical distress. Continue to progress workloads to maintain intensity without signs/symptoms of physical distress.    Average METs -- 2 2.19 2.32      Resistance Training   Training Prescription -- Yes Yes Yes    Weight -- 2 lb 2 lb 2 lb    Reps -- 10-15 10-15 10-15      Interval Training   Interval Training -- No No No      Recumbant Bike   Level -- _0 RPM -- 60 -- --    Watts -- _1 Minutes -- _2 METs -- 1 2.38 --      NuStep   Level -- -- -- 1    SPM -- -- -- 80    Minutes -- -- -- 15    METs -- -- -- 2.3      Arm Ergometer   Level -- _3 Minutes -- _4 METs -- 2 1.9 --      REL-XR   Level -- -- -- 1    Minutes -- -- -- 15    METs -- -- -- 3.18           Exercise Comments:  Exercise Comments    Row Name 05/06/20 1152           Exercise Comments First full day of exercise!  Patient was oriented to gym and equipment including functions, settings, policies, and procedures.  Patient's individual exercise prescription and treatment plan were reviewed.  All starting workloads were established based on the results of the 6 minute walk test done at initial orientation visit.  The plan for exercise progression was also introduced and progression will be customized based on patient's performance and goals.              Exercise Goals and Review:  Exercise Goals    Row Name 04/21/20 1617             Exercise Goals   Increase Physical Activity Yes       Intervention Provide advice, education, support and counseling about physical activity/exercise needs.;Develop an individualized exercise prescription for aerobic and resistive training based on initial evaluation findings, risk stratification, comorbidities and participant's personal goals.       Expected Outcomes Short Term: Attend rehab on a regular basis to increase amount of physical activity.;Long Term: Add in home  exercise to make exercise part of routine and to increase amount of physical activity.;Long Term: Exercising regularly at least 3-5 days a week.       Increase Strength and Stamina Yes       Intervention Provide advice, education, support and counseling about physical activity/exercise needs.;Develop an individualized exercise prescription for aerobic and resistive training based on initial evaluation findings, risk stratification, comorbidities and participant's personal goals.       Expected Outcomes Short Term:  Increase workloads from initial exercise prescription for resistance, speed, and METs.;Short Term: Perform resistance training exercises routinely during rehab and add in resistance training at home;Long Term: Improve cardiorespiratory fitness, muscular endurance and strength as measured by increased METs and functional capacity (6MWT)       Able to understand and use rate of perceived exertion (RPE) scale Yes       Intervention Provide education and explanation on how to use RPE scale       Expected Outcomes Short Term: Able to use RPE daily in rehab to express subjective intensity level;Long Term:  Able to use RPE to guide intensity level when exercising independently       Able to understand and use Dyspnea scale Yes       Intervention Provide education and explanation on how to use Dyspnea scale       Expected Outcomes Short Term: Able to use Dyspnea scale daily in rehab to express subjective sense of shortness of breath during exertion;Long Term: Able to use Dyspnea scale to guide intensity level when exercising independently       Knowledge and understanding of Target Heart Rate Range (THRR) Yes       Intervention Provide education and explanation of THRR including how the numbers were predicted and where they are located for reference       Expected Outcomes Short Term: Able to state/look up THRR;Short Term: Able to use daily as guideline for intensity in rehab;Long Term: Able to use  THRR to govern intensity when exercising independently       Able to check pulse independently Yes       Intervention Provide education and demonstration on how to check pulse in carotid and radial arteries.;Review the importance of being able to check your own pulse for safety during independent exercise       Expected Outcomes Short Term: Able to explain why pulse checking is important during independent exercise;Long Term: Able to check pulse independently and accurately       Understanding of Exercise Prescription Yes       Intervention Provide education, explanation, and written materials on patient's individual exercise prescription       Expected Outcomes Short Term: Able to explain program exercise prescription;Long Term: Able to explain home exercise prescription to exercise independently              Exercise Goals Re-Evaluation :  Exercise Goals Re-Evaluation    Row Name 05/06/20 1154 05/21/20 1024 05/29/20 1143 06/03/20 0918       Exercise Goal Re-Evaluation   Exercise Goals Review Able to understand and use rate of perceived exertion (RPE) scale;Able to understand and use Dyspnea scale;Knowledge and understanding of Target Heart Rate Range (THRR);Understanding of Exercise Prescription Increase Physical Activity;Increase Strength and Stamina;Understanding of Exercise Prescription Increase Physical Activity Increase Physical Activity;Understanding of Exercise Prescription;Increase Strength and Stamina    Comments Reviewed RPE and dyspnea scales, THR and program prescription with pt today.  Pt voiced understanding and was given a copy of goals to take home. Eulalie is off to a good start in rehab.  She has completed her first two full days of exercise. We will continue to monitor her progress. When Katalyna is not coming to rehab she is using her pedal bike that sits on the floor. She also uses it for her arms when she puts it on the kitchen counter. She exercises for about 20 minutes at  a time. Taite is doing well in rehab.  She has tried a variety of different machines. She is up to 15 watts on the Recumbant Bike. Will continue to monitor her progress.    Expected Outcomes Short: Use RPE daily to regulate intensity. Long: Follow program prescription in THR. Short: Return to regular attendance Long; Continue to follow program prescription Short: continue to increase workout activity at home. Long: maintain home exercises independently Short: Continue to increase workloads Long: Continue to increase strength/stamina           Discharge Exercise Prescription (Final Exercise Prescription Changes):  Exercise Prescription Changes - 06/03/20 0900      Response to Exercise   Blood Pressure (Admit) 142/74    Blood Pressure (Exercise) 146/70    Blood Pressure (Exit) 100/58    Heart Rate (Admit) 73 bpm    Heart Rate (Exercise) 108 bpm    Heart Rate (Exit) 82 bpm    Oxygen Saturation (Admit) 100 %    Oxygen Saturation (Exercise) 92 %    Oxygen Saturation (Exit) 95 %    Rating of Perceived Exertion (Exercise) 17    Perceived Dyspnea (Exercise) 2    Symptoms none    Duration Progress to 30 minutes of  aerobic without signs/symptoms of physical distress    Intensity THRR unchanged      Progression   Progression Continue to progress workloads to maintain intensity without signs/symptoms of physical distress.    Average METs 2.32      Resistance Training   Training Prescription Yes    Weight 2 lb    Reps 10-15      Interval Training   Interval Training No      Recumbant Bike   Level 1    Watts 15    Minutes 15      NuStep   Level 1    SPM 80    Minutes 15    METs 2.3      Arm Ergometer   Level 1    Minutes 15      REL-XR   Level 1    Minutes 15    METs 3.18           Nutrition:  Target Goals: Understanding of nutrition guidelines, daily intake of sodium <1563m, cholesterol <2069m calories 30% from fat and 7% or less from saturated fats, daily to  have 5 or more servings of fruits and vegetables.  Education: Controlling Sodium/Reading Food Labels -Group verbal and written material supporting the discussion of sodium use in heart healthy nutrition. Review and explanation with models, verbal and written materials for utilization of the food label.   Education: General Nutrition Guidelines/Fats and Fiber: -Group instruction provided by verbal, written material, models and posters to present the general guidelines for heart healthy nutrition. Gives an explanation and review of dietary fats and fiber.   [facility]  Date 05/15/20  [facility] Review Code 1- Verbalizes Understanding      Biometrics:  Pre Biometrics - 04/21/20 1621      Pre Biometrics   Height 5' 2.75" (1.594 m)    Weight 146 lb 11.2 oz (66.5 kg)   self reported   BMI (Calculated) 26.19            Nutrition Therapy Plan and Nutrition Goals:   Nutrition Assessments:  Nutrition Assessments - 04/21/20 1633      MEDFICTS Scores   Pre Score 18  MEDIFICTS Score Key:          ?70 Need to make dietary changes          40-70 Heart Healthy Diet         ? 40 Therapeutic Level Cholesterol Diet  Nutrition Goals Re-Evaluation:   Nutrition Goals Discharge (Final Nutrition Goals Re-Evaluation):   Psychosocial: Target Goals: Acknowledge presence or absence of significant depression and/or stress, maximize coping skills, provide positive support system. Participant is able to verbalize types and ability to use techniques and skills needed for reducing stress and depression.   Education: Depression - Provides group verbal and written instruction on the correlation between heart/lung disease and depressed mood, treatment options, and the stigmas associated with seeking treatment.   Education: Sleep Hygiene -Provides group verbal and written instruction about how sleep can affect your  health.  Define sleep hygiene, discuss sleep cycles and impact of sleep habits. Review good sleep hygiene tips.    Education: Stress and Anxiety: - Provides group verbal and written instruction about the health risks of elevated stress and causes of high stress.  Discuss the correlation between heart/lung disease and anxiety and treatment options. Review healthy ways to manage with stress and anxiety.   Initial Review & Psychosocial Screening:  Initial Psych Review & Screening - 02/28/20 1121      Initial Review   Current issues with None Identified      Family Dynamics   Good Support System? Yes   Daughter, church family     Barriers   Psychosocial barriers to participate in program There are no identifiable barriers or psychosocial needs.      Screening Interventions   Interventions Encouraged to exercise    Expected Outcomes Short Term goal: Utilizing psychosocial counselor, staff and physician to assist with identification of specific Stressors or current issues interfering with healing process. Setting desired goal for each stressor or current issue identified.;Long Term Goal: Stressors or current issues are controlled or eliminated.;Short Term goal: Identification and review with participant of any Quality of Life or Depression concerns found by scoring the questionnaire.;Long Term goal: The participant improves quality of Life and PHQ9 Scores as seen by post scores and/or verbalization of changes           Quality of Life Scores:  Scores of 19 and below usually indicate a poorer quality of life in these areas.  A difference of  2-3 points is a clinically meaningful difference.  A difference of 2-3 points in the total score of the Quality of Life Index has been associated with significant improvement in overall quality of life, self-image, physical symptoms, and general health in studies assessing change in quality of life.  PHQ-9: Recent Review Flowsheet Data    Depression  screen [facility] 2/9 04/21/2020 10/04/2019 10/04/2019 06/14/2019 03/29/2018   Decreased Interest 0 0 0 0 0   Down, Depressed, Hopeless 0 0 0 0 0   PHQ - 2 Score 0 0 0 0 0   Altered sleeping 0 0 - - -   Tired, decreased energy 0 0 - - -   Change in appetite 0 0 - - -   Feeling bad or failure about yourself  0 0 - - -   Trouble concentrating 0 0 - - -   Moving slowly or fidgety/restless 0 0 - - -   Suicidal thoughts 0 0 - - -   PHQ-9 Score 0 0 - - -   Difficult doing work/chores Not difficult  at all Not difficult at all - - -     Interpretation of Total Score  Total Score Depression Severity:  1-4 = Minimal depression, 5-9 = Mild depression, 10-14 = Moderate depression, 15-19 = Moderately severe depression, 20-27 = Severe depression   Psychosocial Evaluation and Intervention:  Psychosocial Evaluation - 02/28/20 1133      Psychosocial Evaluation & Interventions   Interventions Encouraged to exercise with the program and follow exercise prescription    Comments Benzenaehibits no barriers to attending the program. She is ready to get started.  She has noticed that she is more shoort of breath than her usual. Could walk a hall before getting SOB, not it happens sooner. She lives in a home with her daughter [name]. She recently moved to [place] to live with her daughter. She wears oxygen at night and during the day when up and about. She voiced no concerns about the program, except what machines will she use and she does not feel she will do well on the treadmill. Aryia should do well during her time here.    Expected Outcomes STG: Attends all scheduled sessions. LTG: Improvement in breathing during daily activities.    Continue Psychosocial Services  Follow up required by staff           Psychosocial Re-Evaluation:  Psychosocial Re-Evaluation    [place] Name 05/29/20 1145             Psychosocial Re-Evaluation   Current issues with None Identified       Comments Patient reports no issues with their  current mental states, sleep, stress, depression or anxiety. Will follow up with patient in a few weeks for any changes.       Expected Outcomes Short: Continue to exercise regularly to support mental health and notify staff of any changes. Long: maintain mental health and well being through teaching of rehab or prescribed medications independently.       Interventions Encouraged to attend Pulmonary Rehabilitation for the exercise       Continue Psychosocial Services  Follow up required by staff              Psychosocial Discharge (Final Psychosocial Re-Evaluation):  Psychosocial Re-Evaluation - 05/29/20 1145      Psychosocial Re-Evaluation   Current issues with None Identified    Comments Patient reports no issues with their current mental states, sleep, stress, depression or anxiety. Will follow up with patient in a few weeks for any changes.    Expected Outcomes Short: Continue to exercise regularly to support mental health and notify staff of any changes. Long: maintain mental health and well being through teaching of rehab or prescribed medications independently.    Interventions Encouraged to attend Pulmonary Rehabilitation for the exercise    Continue Psychosocial Services  Follow up required by staff           Education: Education Goals: Education classes will be provided on a weekly basis, covering required topics. Participant will state understanding/return demonstration of topics presented.  Learning Barriers/Preferences:  Learning Barriers/Preferences - 02/28/20 1125      Learning Barriers/Preferences   Learning Barriers None    Learning Preferences Audio;Written Material           General Pulmonary Education Topics:  Infection Prevention: - Provides verbal and written material to individual with discussion of infection control including proper hand washing and proper equipment cleaning during exercise session.   Pulmonary Rehab from 06/05/2020 in [facility] Cardiac and  Pulmonary  Rehab  Date 04/21/20  Educator AS  Instruction Review Code 1- Verbalizes Understanding      Falls Prevention: - Provides verbal and written material to individual with discussion of falls prevention and safety.   Pulmonary Rehab from 06/05/2020 in [facility] Cardiac and Pulmonary Rehab  Date 04/21/20  Educator AS  Instruction Review Code 1- Verbalizes Understanding      Chronic Lung Diseases: - Group verbal and written instruction to review updates, respiratory medications, advancements in procedures and treatments. Discuss use of supplemental oxygen including available portable oxygen systems, continuous and intermittent flow rates, concentrators, personal use and safety guidelines. Review proper use of inhaler and spacers. Provide informative websites for self-education.    Pulmonary Rehab from 06/05/2020 in [facility] Cardiac and Pulmonary Rehab  Date 06/05/20  Educator [facility]  Instruction Review Code 1- Verbalizes Understanding      Energy Conservation: - Provide group verbal and written instruction for methods to conserve energy, plan and organize activities. Instruct on pacing techniques, use of adaptive equipment and posture/positioning to relieve shortness of breath.   Pulmonary Rehab from 06/05/2020 in [facility] Cardiac and Pulmonary Rehab  Date 06/05/20  Educator [facility]  Instruction Review Code 1- Verbalizes Understanding      Triggers and Exacerbations: - Group verbal and written instruction to review types of environmental triggers and ways to prevent exacerbations. Discuss weather changes, air quality and the benefits of nasal washing. Review warning signs and symptoms to help prevent infections. Discuss techniques for effective airway clearance, coughing, and vibrations.   Pulmonary Rehab from 06/05/2020 in [facility] Cardiac and Pulmonary Rehab  Date 06/05/20  Educator [facility]  Instruction Review Code 1- Verbalizes Understanding      AED/CPR: - Group verbal and written instruction with  the use of models to demonstrate the basic use of the AED with the basic ABC's of resuscitation.   Anatomy and Physiology of the Lungs: - Group verbal and written instruction with the use of models to provide basic lung anatomy and physiology related to function, structure and complications of lung disease.   Pulmonary Rehab from 06/05/2020 in [facility] Cardiac and Pulmonary Rehab  Date 06/05/20  Educator [facility]  Instruction Review Code 1- Verbalizes Understanding      Anatomy & Physiology of the Heart: - Group verbal and written instruction and models provide basic cardiac anatomy and physiology, with the coronary electrical and arterial systems. Review of Valvular disease and Heart Failure   Cardiac Medications: - Group verbal and written instruction to review commonly prescribed medications for heart disease. Reviews the medication, class of the drug, and side effects.   Pulmonary Rehab from 06/05/2020 in [facility] Cardiac and Pulmonary Rehab  Date 05/22/20  Educator SB  Instruction Review Code 1- Verbalizes Understanding      Other: -Provides group and verbal instruction on various topics (see comments)   Knowledge Questionnaire Score:  Knowledge Questionnaire Score - 04/21/20 1630      Knowledge Questionnaire Score   Pre Score 9/17            Core Components/Risk Factors/Patient Goals at Admission:  Personal Goals and Risk Factors at Admission - 04/21/20 1623      Core Components/Risk Factors/Patient Goals on Admission    Weight Management Yes    Intervention Weight Management: Develop a combined nutrition and exercise program designed to reach desired caloric intake, while maintaining appropriate intake of nutrient and fiber, sodium and fats, and appropriate energy expenditure required for the weight goal.;Weight Management: Provide education and appropriate  resources to help participant work on and attain dietary goals.    Admit Weight 146 lb 11.2 oz (66.5 kg)    Goal Weight:  Short Term 146 lb 11.2 oz (66.5 kg)    Goal Weight: Long Term 146 lb 11.2 oz (66.5 kg)    Expected Outcomes Short Term: Continue to assess and modify interventions until short term weight is achieved;Long Term: Adherence to nutrition and physical activity/exercise program aimed toward attainment of established weight goal;Weight Maintenance: Understanding of the daily nutrition guidelines, which includes 25-35% calories from fat, 7% or less cal from saturated fats, less than 274m cholesterol, less than 1.5gm of sodium, & 5 or more servings of fruits and vegetables daily           Education:Diabetes - Individual verbal and written instruction to review signs/symptoms of diabetes, desired ranges of glucose level fasting, after meals and with exercise. Acknowledge that pre and post exercise glucose checks will be done for 3 sessions at entry of program.   Education: Know Your Numbers and Risk Factors: -Group verbal and written instruction about important numbers in your health.  Discussion of what are risk factors and how they play a role in the disease process.  Review of Cholesterol, Blood Pressure, Diabetes, and BMI and the role they play in your overall health.   [facility]  Date 05/29/20  Educator SB  Instruction Review Code 1- Verbalizes Understanding      Core Components/Risk Factors/Patient Goals Review:   Goals and Risk Factor Review    Row Name 05/29/20 1141             Core Components/Risk Factors/Patient Goals Review   Personal Goals Review Weight Management/Obesity;Improve shortness of breath with ADL's       Review Spoke to patient about their shortness of breath and what they can do to improve. Patient has been informed of breathing techniques when starting the program. Patient is informed to tell staff if they have had any med changes and that certain meds they are taking or not taking can be causing shortness of  breath.       Expected Outcomes Short: Attend [name] regularly to improve shortness of breath with ADL's. Long: maintain independence with ADL's              Core Components/Risk Factors/Patient Goals at Discharge (Final Review):   Goals and Risk Factor Review - 05/29/20 1141      Core Components/Risk Factors/Patient Goals Review   Personal Goals Review Weight Management/Obesity;Improve shortness of breath with ADL's    Review Spoke to patient about their shortness of breath and what they can do to improve. Patient has been informed of breathing techniques when starting the program. Patient is informed to tell staff if they have had any med changes and that certain meds they are taking or not taking can be causing shortness of breath.    Expected Outcomes Short: Attend [name] regularly to improve shortness of breath with ADL's. Long: maintain independence with ADL's           ITP Comments:  ITP Comments    Row Name 02/28/20 1139 04/21/20 1639 05/06/20 1152 05/14/20 1606 06/11/20 0557   ITP Comments Virtual orientation call completed today. She has an appointment on Date: 03/04/2020 for EP eval and gym Orientation.  [facility]  [facility] Completed 6MWT and gym orientation. Initial ITP created and sent for  review to Dr. Emily Filbert, Medical Director. First full day of exercise!  Patient was oriented to gym and equipment including functions, settings, policies, and procedures.  Patient's individual exercise prescription and treatment plan were reviewed.  All starting workloads were established based on the results of the 6 minute walk test done at initial orientation visit.  The plan for exercise progression was also introduced and progression will be customized based on patient's performance and goals. 30 day review completed. ITP sent to Dr. Emily Filbert, Medical Director of Cardiac and Pulmonary Rehab. Continue with ITP unless changes are made by  physician. 30 Day review completed. Medical Director ITP review done, changes made as directed, and signed approval by Medical Director.          Comments:

## 2020-06-12 ENCOUNTER — Other Ambulatory Visit: Payer: Self-pay

## 2020-06-12 ENCOUNTER — Encounter: Payer: Medicare Other | Admitting: *Deleted

## 2020-06-12 DIAGNOSIS — J439 Emphysema, unspecified: Secondary | ICD-10-CM

## 2020-06-12 NOTE — Progress Notes (Signed)
Daily Session Note  Patient Details  Name: Brianna Adams MRN: [id number] Date of Birth: 08-03-1941 Referring Provider:     Pulmonary Rehab from 04/21/2020 in [facility] Cardiac and Pulmonary Rehab  Referring Provider [name]      Encounter Date: 06/12/2020  Check In:  Session Check In - 06/12/20 1132      Check-In   Supervising physician immediately available to respond to emergencies See telemetry face sheet for immediately available ER MD    Location [facility]-Cardiac & Pulmonary Rehab    Staff Present Renita Papa, RN BSN;Joseph Lou Min[place], [place] Exercise Physiologist;Jessica [place], [place], RCEP, CCRP, CCET;Melissa Opelousas RDN, LDN    Virtual Visit No    Medication changes reported     No    Fall or balance concerns reported    No    Warm-up and Cool-down Performed on first and last piece of equipment    Resistance Training Performed Yes    VAD Patient? No    PAD/SET Patient? No      Pain Assessment   Currently in Pain? No/denies              Social History   Tobacco Use  Smoking Status Former Smoker  . Packs/day: 1.00  . Years: 58.00  . Pack years: 58.00  . Types: Cigarettes  . Quit date: 10/22/2015  . Years since quitting: 4.6  Smokeless Tobacco Never Used    Goals Met:  Independence with exercise equipment Exercise tolerated well No report of cardiac concerns or symptoms Strength training completed today  Goals Unmet:  Not Applicable  Comments: Pt able to follow exercise prescription today without complaint.  Will continue to monitor for progression.    Dr. Emily Filbert is Medical Director for [place] and [name]monary [facility].

## 2020-06-15 ENCOUNTER — Other Ambulatory Visit: Payer: Self-pay | Admitting: Internal Medicine

## 2020-06-16 NOTE — Assessment & Plan Note (Signed)
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 Needs repeat CT chest

## 2020-06-16 NOTE — Assessment & Plan Note (Addendum)
-  Stable; Emphysema on imaging, No obvious signs of obstruction on PFTs.  - Continue Anoro 1 puff daily in the morning - Use albuterol rescue inhaler 2 puffs every 4-6 hours as needed for breakthrough shortness of breath/wheezing - Continue pulmonary rehab  - 3 months with Dr. [name]

## 2020-06-16 NOTE — Assessment & Plan Note (Addendum)
-  Continues to benefit from o2 3L/min

## 2020-06-17 ENCOUNTER — Encounter: Payer: Medicare Other | Admitting: *Deleted

## 2020-06-17 ENCOUNTER — Other Ambulatory Visit: Payer: Self-pay

## 2020-06-17 DIAGNOSIS — J439 Emphysema, unspecified: Secondary | ICD-10-CM

## 2020-06-17 NOTE — Progress Notes (Signed)
Daily Session Note  Patient Details  Name: Brianna Adams MRN: [id number] Date of Birth: 1941/02/11 Referring Provider:     Pulmonary Rehab from 04/21/2020 in [facility] Cardiac and Pulmonary Rehab  Referring Provider [name]      Encounter Date: 06/17/2020  Check In:  Session Check In - 06/17/20 1159      Check-In   Supervising physician immediately available to respond to emergencies See telemetry face sheet for immediately available ER MD    Location [facility]-Cardiac & Pulmonary Rehab    Staff Present Heath Lark, RN, BSN, CCRP;Melissa Dunbar RDN, Rowe Pavy, BA, ACSM CEP, Exercise Physiologist;Joseph Hood RCP,RRT,BSRT    Virtual Visit No    Medication changes reported     No    Fall or balance concerns reported    No    Warm-up and Cool-down Performed on first and last piece of equipment    Resistance Training Performed Yes    VAD Patient? No    PAD/SET Patient? No      Pain Assessment   Currently in Pain? No/denies              Social History   Tobacco Use  Smoking Status Former Smoker  . Packs/day: 1.00  . Years: 58.00  . Pack years: 58.00  . Types: Cigarettes  . Quit date: 10/22/2015  . Years since quitting: 4.6  Smokeless Tobacco Never Used    Goals Met:  Proper associated with RPD/PD & O2 Sat Independence with exercise equipment Exercise tolerated well No report of cardiac concerns or symptoms  Goals Unmet:  Not Applicable  Comments: Pt able to follow exercise prescription today without complaint.  Will continue to monitor for progression.    Dr. Emily Filbert is Medical Director for [place] and [name].

## 2020-06-19 ENCOUNTER — Encounter: Payer: Medicare Other | Admitting: *Deleted

## 2020-06-19 ENCOUNTER — Other Ambulatory Visit: Payer: Self-pay

## 2020-06-19 DIAGNOSIS — J439 Emphysema, unspecified: Secondary | ICD-10-CM

## 2020-06-19 NOTE — Progress Notes (Signed)
Daily Session Note  Patient Details  Name: Brianna Adams MRN: [id number] Date of Birth: 08-May-1941 Referring Provider:     Pulmonary Rehab from 04/21/2020 in [facility] Cardiac and Pulmonary Rehab  Referring Provider [name]      Encounter Date: 06/19/2020  Check In:  Session Check In - 06/19/20 1141      Check-In   Supervising physician immediately available to respond to emergencies See telemetry face sheet for immediately available ER MD    Location [facility]-Cardiac & Pulmonary Rehab    Staff Present Renita Papa, RN BSN;Joseph [address] [place], [place], [place], CCRP, CCET;Melissa Shabbona RDN, Tawanna Solo, [place] Exercise Physiologist    Virtual Visit No    Medication changes reported     No    Fall or balance concerns reported    No    Warm-up and Cool-down Performed on first and last piece of equipment    Resistance Training Performed Yes    VAD Patient? No    PAD/SET Patient? No      Pain Assessment   Currently in Pain? No/denies              Social History   Tobacco Use  Smoking Status Former Smoker  . Packs/day: 1.00  . Years: 58.00  . Pack years: 58.00  . Types: Cigarettes  . Quit date: 10/22/2015  . Years since quitting: 4.6  Smokeless Tobacco Never Used    Goals Met:  Independence with exercise equipment Exercise tolerated well No report of cardiac concerns or symptoms Strength training completed today  Goals Unmet:  Not Applicable  Comments: Pt able to follow exercise prescription today without complaint.  Will continue to monitor for progression.    Dr. Emily Filbert is Medical Director for [place] and [name].

## 2020-06-20 NOTE — Progress Notes (Signed)
Agree with the details of the visit as noted by Elizabeth Walsh, NP.  C. Laura Tulsi Crossett, MD [place] [facility] 

## 2020-06-24 ENCOUNTER — Encounter: Payer: Medicare Other | Admitting: *Deleted

## 2020-06-24 ENCOUNTER — Other Ambulatory Visit: Payer: Self-pay

## 2020-06-24 DIAGNOSIS — J439 Emphysema, unspecified: Secondary | ICD-10-CM

## 2020-06-24 NOTE — Progress Notes (Signed)
Daily Session Note  Patient Details  Name: Brianna Adams MRN: [id number] Date of Birth: 1940/12/20 Referring Provider:     Pulmonary Rehab from 04/21/2020 in [facility] Cardiac and Pulmonary Rehab  Referring Provider [name]      Encounter Date: 06/24/2020  Check In:  Session Check In - 06/24/20 1144      Check-In   Supervising physician immediately available to respond to emergencies See telemetry face sheet for immediately available ER MD    Location [facility]-Cardiac & Pulmonary Rehab    Staff Present Heath Lark, RN, BSN, Jacklynn Bue, MS Exercise Physiologist;Amanda Oletta Darter, [place], ACSM CEP, Exercise Physiologist    Virtual Visit No    Medication changes reported     No    Fall or balance concerns reported    No    Warm-up and Cool-down Performed on first and last piece of equipment    Resistance Training Performed Yes    VAD Patient? No    PAD/SET Patient? No      Pain Assessment   Currently in Pain? No/denies              Social History   Tobacco Use  Smoking Status Former Smoker  . Packs/day: 1.00  . Years: 58.00  . Pack years: 58.00  . Types: Cigarettes  . Quit date: 10/22/2015  . Years since quitting: 4.6  Smokeless Tobacco Never Used    Goals Met:  Proper associated with RPD/PD & O2 Sat Independence with exercise equipment Exercise tolerated well No report of cardiac concerns or symptoms  Goals Unmet:  Not Applicable  Comments: Pt able to follow exercise prescription today without complaint.  Will continue to monitor for progression.    Dr. Emily Filbert is Medical Director for [place] and [name].

## 2020-06-25 ENCOUNTER — Ambulatory Visit
Admission: RE | Admit: 2020-06-25 | Discharge: 2020-06-25 | Disposition: A | Discharge status: Home / Self Care | Payer: Medicare Other | Source: Ambulatory Visit | Attending: Primary Care | Admitting: Primary Care

## 2020-06-25 ENCOUNTER — Other Ambulatory Visit: Payer: Self-pay

## 2020-06-25 DIAGNOSIS — Z85118 Personal history of other malignant neoplasm of bronchus and lung: Secondary | ICD-10-CM | POA: Insufficient documentation

## 2020-06-26 ENCOUNTER — Encounter: Payer: Medicare Other | Admitting: *Deleted

## 2020-06-26 DIAGNOSIS — J439 Emphysema, unspecified: Secondary | ICD-10-CM | POA: Diagnosis not present

## 2020-06-26 NOTE — Progress Notes (Signed)
Daily Session Note  Patient Details  Name: Brianna Adams MRN: [id number] Date of Birth: 09/21/1940 Referring Provider:     Pulmonary Rehab from 04/21/2020 in [facility] Cardiac and Pulmonary Rehab  Referring Provider [name]      Encounter Date: 06/26/2020  Check In:  Session Check In - 06/26/20 1122      Check-In   Supervising physician immediately available to respond to emergencies See telemetry face sheet for immediately available ER MD    Location [facility]-Cardiac & Pulmonary Rehab    Staff Present Hope Budds RDN, Luther Redo, MPA, RN;Jordon Bourquin Sherryll Burger, RN BSN;Joseph Lou Miner, [place] Exercise Physiologist    Virtual Visit No    Medication changes reported     No    Fall or balance concerns reported    No    Warm-up and Cool-down Performed on first and last piece of equipment    Resistance Training Performed Yes    VAD Patient? No    PAD/SET Patient? No      Pain Assessment   Currently in Pain? No/denies              Social History   Tobacco Use  Smoking Status Former Smoker  . Packs/day: 1.00  . Years: 58.00  . Pack years: 58.00  . Types: Cigarettes  . Quit date: 10/22/2015  . Years since quitting: 4.6  Smokeless Tobacco Never Used    Goals Met:  Independence with exercise equipment Exercise tolerated well No report of cardiac concerns or symptoms Strength training completed today  Goals Unmet:  Not Applicable  Comments: Pt able to follow exercise prescription today without complaint.  Will continue to monitor for progression.    Dr. Emily Filbert is Medical Director for [place] and [name].

## 2020-07-01 ENCOUNTER — Other Ambulatory Visit: Payer: Self-pay

## 2020-07-01 ENCOUNTER — Encounter: Payer: Medicare Other | Admitting: *Deleted

## 2020-07-01 ENCOUNTER — Other Ambulatory Visit: Payer: Self-pay | Admitting: Nurse Practitioner

## 2020-07-01 DIAGNOSIS — J439 Emphysema, unspecified: Secondary | ICD-10-CM | POA: Diagnosis not present

## 2020-07-01 NOTE — Progress Notes (Signed)
Daily Session Note  Patient Details  Name: Brianna Adams MRN: [id number] Date of Birth: 09-09-1940 Referring Provider:     Pulmonary Rehab from 04/21/2020 in [facility] Cardiac and Pulmonary Rehab  Referring Provider [name]      Encounter Date: 07/01/2020  Check In:  Session Check In - 07/01/20 1117      Check-In   Supervising physician immediately available to respond to emergencies See telemetry face sheet for immediately available ER MD    Location [facility]-Cardiac & Pulmonary Rehab    Staff Present Renita Papa, RN BSN;Joseph Lou Miner, [place] Exercise Physiologist;Melissa Farmington RDN, LDN;Jessica [place], MA, RCEP, CCRP, CCET    Virtual Visit No    Medication changes reported     No    Fall or balance concerns reported    No    Warm-up and Cool-down Performed on first and last piece of equipment    Resistance Training Performed Yes    VAD Patient? No    PAD/SET Patient? No      Pain Assessment   Currently in Pain? No/denies              Social History   Tobacco Use  Smoking Status Former Smoker  . Packs/day: 1.00  . Years: 58.00  . Pack years: 58.00  . Types: Cigarettes  . Quit date: 10/22/2015  . Years since quitting: 4.6  Smokeless Tobacco Never Used    Goals Met:  Independence with exercise equipment Exercise tolerated well No report of cardiac concerns or symptoms Strength training completed today  Goals Unmet:  Not Applicable  Comments: Pt able to follow exercise prescription today without complaint.  Will continue to monitor for progression.    Dr. Emily Filbert is Medical Director for [place] and [name].

## 2020-07-02 NOTE — Progress Notes (Signed)
Please let patient know CT chest showed  no new or acute pulmonary findings.   She has stable severe emphysema, stable surgical changed right lower lobe with no evidence of tumor re-occurrence, progressive consolidation likely from radiation. Stable multinodular thyroid goiter. Needs to either follow-up with PCP or we can order an thyroid ultrasound to evaluate this.   Needs repeat CT chest wo contrast to monitor radiation fibrosis in 12 months.

## 2020-07-03 ENCOUNTER — Other Ambulatory Visit: Payer: Self-pay

## 2020-07-03 DIAGNOSIS — J439 Emphysema, unspecified: Secondary | ICD-10-CM | POA: Diagnosis not present

## 2020-07-03 NOTE — Progress Notes (Signed)
Daily Session Note  Patient Details  Name: Brianna Adams MRN: [id number] Date of Birth: 07/31/1941 Referring Provider:     Pulmonary Rehab from 04/21/2020 in [facility] Cardiac and Pulmonary Rehab  Referring Provider [name]      Encounter Date: 07/03/2020  Check In:  Session Check In - 07/03/20 1158      Check-In   Supervising physician immediately available to respond to emergencies See telemetry face sheet for immediately available ER MD    Location [facility]-Cardiac & Pulmonary Rehab    Staff Present Birdie Sons, MPA, RN;Melissa Caiola RDN, Rowe Pavy, BA, ACSM CEP, Exercise Physiologist;Kara Eliezer Bottom, MS Exercise Physiologist    Virtual Visit No    Medication changes reported     No    Fall or balance concerns reported    No    Warm-up and Cool-down Performed on first and last piece of equipment    Resistance Training Performed Yes    VAD Patient? No    PAD/SET Patient? No      Pain Assessment   Currently in Pain? No/denies              Social History   Tobacco Use  Smoking Status Former Smoker  . Packs/day: 1.00  . Years: 58.00  . Pack years: 58.00  . Types: Cigarettes  . Quit date: 10/22/2015  . Years since quitting: 4.7  Smokeless Tobacco Never Used    Goals Met:  Independence with exercise equipment Exercise tolerated well No report of cardiac concerns or symptoms Strength training completed today  Goals Unmet:  Not Applicable  Comments: Pt able to follow exercise prescription today without complaint.  Will continue to monitor for progression.    Dr. Emily Filbert is Medical Director for [place] and [name].

## 2020-07-08 ENCOUNTER — Encounter: Payer: Medicare Other | Attending: Pulmonary Disease | Admitting: *Deleted

## 2020-07-08 ENCOUNTER — Other Ambulatory Visit: Payer: Self-pay

## 2020-07-08 DIAGNOSIS — J439 Emphysema, unspecified: Secondary | ICD-10-CM | POA: Diagnosis not present

## 2020-07-08 NOTE — Progress Notes (Signed)
Daily Session Note  Patient Details  Name: Brianna Adams MRN: [id number] Date of Birth: [birth year] Referring Provider:     Pulmonary Rehab from 04/21/2020 in [facility] Cardiac and Pulmonary Rehab  Referring Provider [name]      Encounter Date: 07/08/2020  Check In:  Session Check In - 07/08/20 1135      Check-In   Supervising physician immediately available to respond to emergencies See telemetry face sheet for immediately available ER MD    Location [facility]-Cardiac & Pulmonary Rehab    Staff Present Heath Lark, RN, BSN, CCRP;Melissa Maeser RDN, LDN;Joseph [employer], [place], ACSM CEP, Exercise Physiologist    Virtual Visit No    Medication changes reported     No    Fall or balance concerns reported    No    Warm-up and Cool-down Performed on first and last piece of equipment    Resistance Training Performed Yes    VAD Patient? No    PAD/SET Patient? No      Pain Assessment   Currently in Pain? No/denies              Social History   Tobacco Use  Smoking Status Former Smoker  . Packs/day: 1.00  . Years: 58.00  . Pack years: 58.00  . Types: Cigarettes  . Quit date: 10/22/2015  . Years since quitting: 4.7  Smokeless Tobacco Never Used    Goals Met:  Proper associated with RPD/PD & O2 Sat Independence with exercise equipment Exercise tolerated well No report of cardiac concerns or symptoms  Goals Unmet:  Not Applicable  Comments: Pt able to follow exercise prescription today without complaint.  Will continue to monitor for progression.    Dr. Emily Filbert is Medical Director for [place] and [name].

## 2020-07-09 ENCOUNTER — Encounter: Payer: Self-pay | Admitting: *Deleted

## 2020-07-09 DIAGNOSIS — J439 Emphysema, unspecified: Secondary | ICD-10-CM

## 2020-07-09 NOTE — Progress Notes (Signed)
Pulmonary Individual Treatment Plan  Patient Details  Name: Brianna Adams MRN: [id number] Date of Birth: 09-Aug-1941 Referring Provider:     Pulmonary Rehab from 04/21/2020 in [facility] Cardiac and Pulmonary Rehab  Referring Provider [name]      Initial Encounter Date:    Pulmonary Rehab from 04/21/2020 in [facility] Cardiac and Pulmonary Rehab  Date 04/21/20      Visit Diagnosis: Pulmonary emphysema, unspecified emphysema type ([place])  Patient's Home Medications on Admission:  Current Outpatient Medications:  .  acetaminophen (TYLENOL) 500 MG tablet, Take 1,000 mg by mouth every 6 (six) hours as needed for mild pain or fever. , Disp: , Rfl:  .  albuterol (VENTOLIN HFA) 108 (90 Base) MCG/ACT inhaler, Inhale 2 puffs into the lungs every 6 (six) hours as needed for wheezing or shortness of breath., Disp: 18 g, Rfl: 0 .  apixaban (ELIQUIS) 5 MG TABS tablet, Take 1 tablet (5 mg total) by mouth 2 (two) times daily., Disp: 90 tablet, Rfl: 1 .  calcium carbonate (OSCAL) 1500 (600 Ca) MG TABS tablet, Take 1,200 mg by mouth daily., Disp: , Rfl:  .  Cinnamon 500 MG TABS, Take 1,000 mg by mouth daily. , Disp: , Rfl:  .  furosemide (LASIX) 20 MG tablet, TAKE 1 TABLET BY MOUTH EVERY DAY (Patient taking differently: Take 20 mg by mouth daily. ), Disp: 90 tablet, Rfl: 2 .  metFORMIN (GLUCOPHAGE) 1000 MG tablet, TAKE 1 TABLET (1,000 MG TOTAL) BY MOUTH 2 (TWO) TIMES DAILY WITH A MEAL., Disp: 180 tablet, Rfl: 0 .  Multiple Vitamins-Minerals (CENTRUM SILVER PO), Take 1 tablet by mouth daily. , Disp: , Rfl:  .  omeprazole (PRILOSEC) 40 MG capsule, Take 1 capsule (40 mg total) by mouth daily., Disp: 90 capsule, Rfl: 1 .  pravastatin (PRAVACHOL) 10 MG tablet, TAKE 1 TABLET BY MOUTH EVERY DAY (Patient taking differently: Take 10 mg by mouth daily. ), Disp: 90 tablet, Rfl: 1 .  umeclidinium-vilanterol (ANORO ELLIPTA) 62.5-25 MCG/INH AEPB, Inhale 1 puff into the lungs daily. , Disp: , Rfl:  .  vitamin C (ASCORBIC ACID) 500  MG tablet, Take 500 mg by mouth daily. , Disp: , Rfl:   Past Medical History: Past Medical History:  Diagnosis Date  . CHF (congestive heart failure) ([place])   . COPD (chronic obstructive pulmonary disease) ([place])   . Diabetes ([place])   . Diverticulitis   . High cholesterol   . Lung cancer ([place])   . Pulmonary hypertension ([place])   . Uterine cancer ([place])     Tobacco Use: Social History   Tobacco Use  Smoking Status Former Smoker  . Packs/day: 1.00  . Years: 58.00  . Pack years: 58.00  . Types: Cigarettes  . Quit date: 10/22/2015  . Years since quitting: 4.7  Smokeless Tobacco Never Used    Labs: Recent Review Flowsheet Data    Labs for ITP Cardiac and Pulmonary Rehab Latest Ref Rng & Units 06/14/2019 10/04/2019 01/07/2020 02/29/2020 05/13/2020   Cholestrol 100 - 199 mg/dL - 152 149 - 168   LDLCALC 0 - 99 mg/dL - 80 73 - 94   HDL >39 mg/dL - 57 61 - 61   Trlycerides 0 - 149 mg/dL - 76 80 - 67   Hemoglobin A1c 4.8 - 5.6 % 6.3(H) 6.2(H) 5.8(H) - 6.0(H)   HCO3 20.0 - 28.0 mmol/L - - - 19.0(L) -   ACIDBASEDEF 0.0 - 2.0 mmol/L - - - 4.0(H) -   O2SAT % - - -  99.6 -       Pulmonary Assessment Scores:  Pulmonary Assessment Scores    Row Name 04/21/20 1628         ADL UCSD   SOB Score total 68     Rest 0     Walk 3     Stairs 4     Bath 2     Dress 1     Shop 3       CAT Score   CAT Score 22       mMRC Score   mMRC Score 2            UCSD: Self-administered rating of dyspnea associated with activities of daily living (ADLs) 6-point scale (0 = "not at all" to 5 = "maximal or unable to do because of breathlessness")  Scoring Scores range from 0 to 120.  Minimally important difference is 5 units  CAT: CAT can identify the health impairment of COPD patients and is better correlated with disease progression.  CAT has a scoring range of zero to 40. The CAT score is classified into four groups of low (less than 10), medium (10 - 20), high (21-30) and very high (31-40) based  on the impact level of disease on health status. A CAT score over 10 suggests significant symptoms.  A worsening CAT score could be explained by an exacerbation, poor medication adherence, poor inhaler technique, or progression of COPD or comorbid conditions.  CAT MCID is 2 points  mMRC: mMRC (Modified Medical Research Council) Dyspnea Scale is used to assess the degree of baseline functional disability in patients of respiratory disease due to dyspnea. No minimal important difference is established. A decrease in score of 1 point or greater is considered a positive change.   Pulmonary Function Assessment:   Exercise Target Goals: Exercise Program Goal: Individual exercise prescription set using results from initial 6 min walk test and THRR while considering  patient's activity barriers and safety.   Exercise Prescription Goal: Initial exercise prescription builds to 30-45 minutes a day of aerobic activity, 2-3 days per week.  Home exercise guidelines will be given to patient during program as part of exercise prescription that the participant will acknowledge.  Education: Aerobic Exercise & Resistance Training: - Gives group verbal and written instruction on the various components of exercise. Focuses on aerobic and resistive training programs and the benefits of this training and how to safely progress through these programs..   Pulmonary Rehab from 07/03/2020 in [facility] Cardiac and Pulmonary Rehab  Date 07/03/20  Hilda Blades only on 10/28]  Educator [facility]  Instruction Review Code 1- [employer] Understanding      Education: Exercise & Equipment Safety: - Individual verbal instruction and demonstration of equipment use and safety with use of the equipment.   Pulmonary Rehab from 07/03/2020 in [facility] Cardiac and Pulmonary Rehab  Date 04/21/20  Educator AS  Instruction Review Code 1- Verbalizes Understanding      Education: Exercise Physiology & General Exercise Guidelines: - Group verbal  and written instruction with models to review the exercise physiology of the cardiovascular system and associated critical values. Provides general exercise guidelines with specific guidelines to those with heart or lung disease.    Pulmonary Rehab from 07/03/2020 in [facility] Cardiac and Pulmonary Rehab  Date 06/19/20  Educator [facility]  Instruction Review Code 1- Verbalizes Understanding      Education: Flexibility, Balance, Mind/Body Relaxation: Provides group verbal/written instruction on the benefits of flexibility and balance training, including mind/body  exercise modes such as yoga, pilates and tai chi.  Demonstration and skill practice provided.   Activity Barriers & Risk Stratification:  Activity Barriers & Cardiac Risk Stratification - 02/28/20 1107      Activity Barriers & Cardiac Risk Stratification   Activity Barriers None           6 Minute Walk:  6 Minute Walk    Row Name 04/21/20 1609         6 Minute Walk   Phase Initial     Distance 380 feet     Walk Time 4.5 minutes     # of Rest Breaks 3     MPH 0.95     METS 0.8     RPE 11     Perceived Dyspnea  2     VO2 Peak 2.75     Symptoms No     Resting HR 85 bpm     Resting BP 120/60     Resting Oxygen Saturation  98 %     Exercise Oxygen Saturation  during 6 min walk 90 %     Max Ex. HR 103 bpm     Max Ex. BP 134/66     2 Minute Post BP 130/62       Interval HR   1 Minute HR 102     2 Minute HR 93     3 Minute HR 100     4 Minute HR 100     5 Minute HR 101     6 Minute HR 103     2 Minute Post HR 82     Interval Heart Rate? Yes       Interval Oxygen   Interval Oxygen? Yes     Baseline Oxygen Saturation % 98 %     1 Minute Oxygen Saturation % 92 %     1 Minute Liters of Oxygen 3 L     2 Minute Oxygen Saturation % 90 %     2 Minute Liters of Oxygen 3 L     3 Minute Oxygen Saturation % 94 %     3 Minute Liters of Oxygen 3 L     4 Minute Oxygen Saturation % 90 %     4 Minute Liters of Oxygen 3 L     5  Minute Oxygen Saturation % 93 %     5 Minute Liters of Oxygen 3 L     6 Minute Oxygen Saturation % 93 %     6 Minute Liters of Oxygen 3 L     2 Minute Post Oxygen Saturation % 98 %     2 Minute Post Liters of Oxygen 3 L           Oxygen Initial Assessment:  Oxygen Initial Assessment - 05/06/20 1152      Home Oxygen   Home Oxygen Device Portable Concentrator;Home Concentrator    Sleep Oxygen Prescription Continuous    Liters per minute 2    Home Exercise Oxygen Prescription None    Home Resting Oxygen Prescription Continuous    Liters per minute -1    Compliance with Home Oxygen Use Yes      Intervention   Short Term Goals To learn and exhibit compliance with exercise, home and travel O2 prescription;To learn and understand importance of monitoring SPO2 with pulse oximeter and demonstrate accurate use of the pulse oximeter.;To learn and understand importance of maintaining oxygen saturations>88%;To learn and demonstrate proper  pursed lip breathing techniques or other breathing techniques.;To learn and demonstrate proper use of respiratory medications    Long  Term Goals Verbalizes importance of monitoring SPO2 with pulse oximeter and return demonstration;Exhibits proper breathing techniques, such as pursed lip breathing or other method taught during program session;Maintenance of O2 saturations>88%;Exhibits compliance with exercise, home and travel O2 prescription;Demonstrates proper use of MDI's;Compliance with respiratory medication           Oxygen Re-Evaluation:  Oxygen Re-Evaluation    [place] Name 05/06/20 1153 05/29/20 1137 06/17/20 1136         Program Oxygen Prescription   Program Oxygen Prescription -- Continuous;E-Tanks Continuous;E-Tanks     Liters per minute -- 3 --       Home Oxygen   Home Oxygen Device -- Home Concentrator;Portable Concentrator;E-Tanks Home Concentrator;Portable Concentrator;E-Tanks     Sleep Oxygen Prescription -- Continuous Continuous      Liters per minute -- 3 3     Home Exercise Oxygen Prescription -- Continuous Continuous     Liters per minute -- 3 3     Home Resting Oxygen Prescription -- Continuous Continuous     Liters per minute -- 3 3     Compliance with Home Oxygen Use -- Yes Yes       Goals/Expected Outcomes   Short Term Goals To learn and demonstrate proper pursed lip breathing techniques or other breathing techniques. To learn and exhibit compliance with exercise, home and travel O2 prescription;To learn and understand importance of monitoring SPO2 with pulse oximeter and demonstrate accurate use of the pulse oximeter.;To learn and understand importance of maintaining oxygen saturations>88%;To learn and demonstrate proper use of respiratory medications;To learn and demonstrate proper pursed lip breathing techniques or other breathing techniques. To learn and exhibit compliance with exercise, home and travel O2 prescription;To learn and understand importance of monitoring SPO2 with pulse oximeter and demonstrate accurate use of the pulse oximeter.;To learn and understand importance of maintaining oxygen saturations>88%;To learn and demonstrate proper use of respiratory medications;To learn and demonstrate proper pursed lip breathing techniques or other breathing techniques.     Long  Term Goals Exhibits proper breathing techniques, such as pursed lip breathing or other method taught during program session Exhibits compliance with exercise, home and travel O2 prescription;Exhibits proper breathing techniques, such as pursed lip breathing or other method taught during program session;Verbalizes importance of monitoring SPO2 with pulse oximeter and return demonstration;Demonstrates proper use of MDI's;Maintenance of O2 saturations>88%;Compliance with respiratory medication Exhibits compliance with exercise, home and travel O2 prescription;Exhibits proper breathing techniques, such as pursed lip breathing or other method taught  during program session;Verbalizes importance of monitoring SPO2 with pulse oximeter and return demonstration;Demonstrates proper use of MDI's;Maintenance of O2 saturations>88%;Compliance with respiratory medication     Comments Reviewed PLB technique with pt.  Talked about how it works and it's importance in maintaining their exercise saturations. Informed patient how to perform the Pursed Lipped breathing technique. Told patient to Inhale through the nose and out the mouth with pursed lips to keep their airways open, help oxygenate them better, practice when at rest or doing strenuous activity. Patient Verbalizes understanding of technique and will work on and be reiterated during [place]. Pt uses PLB when she remembers.  Sh eis feeling like SOB has improved since starting LW program.     Goals/Expected Outcomes Short: Become more profiecient at using PLB.   Long: Become independent at using PLB. Short: use PLB with exertion. Long: use PLB on exertion  proficiently and independently Short: continue to be compliant with O2 and PLB Long: use PLB as needed            Oxygen Discharge (Final Oxygen Re-Evaluation):  Oxygen Re-Evaluation - 06/17/20 1136      Program Oxygen Prescription   Program Oxygen Prescription Continuous;E-Tanks      Home Oxygen   Home Oxygen Device Home Concentrator;Portable Concentrator;E-Tanks    Sleep Oxygen Prescription Continuous    Liters per minute 3    Home Exercise Oxygen Prescription Continuous    Liters per minute 3    Home Resting Oxygen Prescription Continuous    Liters per minute 3    Compliance with Home Oxygen Use Yes      Goals/Expected Outcomes   Short Term Goals To learn and exhibit compliance with exercise, home and travel O2 prescription;To learn and understand importance of monitoring SPO2 with pulse oximeter and demonstrate accurate use of the pulse oximeter.;To learn and understand importance of maintaining oxygen saturations>88%;To learn and  demonstrate proper use of respiratory medications;To learn and demonstrate proper pursed lip breathing techniques or other breathing techniques.    Long  Term Goals Exhibits compliance with exercise, home and travel O2 prescription;Exhibits proper breathing techniques, such as pursed lip breathing or other method taught during program session;Verbalizes importance of monitoring SPO2 with pulse oximeter and return demonstration;Demonstrates proper use of MDI's;Maintenance of O2 saturations>88%;Compliance with respiratory medication    Comments Pt uses PLB when she remembers.  Sh eis feeling like SOB has improved since starting LW program.    Goals/Expected Outcomes Short: continue to be compliant with O2 and PLB Long: use PLB as needed           Initial Exercise Prescription:  Initial Exercise Prescription - 04/21/20 1600      Date of Initial Exercise RX and Referring Provider   Date 04/21/20    Referring Provider [name]      Treadmill   MPH 0.8    Grade 0    Minutes 15    METs 1      Recumbant Bike   Level 1    RPM 60    Watts 5    Minutes 15    METs 1      NuStep   Level 1    SPM 80    Minutes 15    METs 1      Arm Ergometer   Level 1    RPM 25    Minutes 15    METs 1      Recumbant Elliptical   Level 1    RPM 30    Minutes 15    METs 1      Prescription Details   Frequency (times per week) 3    Duration Progress to 30 minutes of continuous aerobic without signs/symptoms of physical distress      Intensity   THRR 40-80% of Max Heartrate 107-129    Ratings of Perceived Exertion 11-13    Perceived Dyspnea 0-4      Resistance Training   Training Prescription Yes    Weight 2 lb    Reps 10-15           Perform Capillary Blood Glucose checks as needed.  Exercise Prescription Changes:  Exercise Prescription Changes    Row Name 04/21/20 1600 05/06/20 1500 05/21/20 1000 06/03/20 0900 07/03/20 1100     Response to Exercise   Blood Pressure (Admit)  120/60 124/64 122/60 142/74 112/60  Blood Pressure (Exercise) 134/66 128/70 146/64 146/70 126/64   Blood Pressure (Exit) 130/62 122/62 138/70 100/58 128/64   Heart Rate (Admit) 85 bpm 97 bpm 84 bpm 73 bpm 86 bpm   Heart Rate (Exercise) 103 bpm 100 bpm 114 bpm 108 bpm 109 bpm   Heart Rate (Exit) 82 bpm 90 bpm 95 bpm 82 bpm 87 bpm   Oxygen Saturation (Admit) 98 % 89 % 99 % 100 % 93 %   Oxygen Saturation (Exercise) 90 % 96 % 92 % 92 % 92 %   Oxygen Saturation (Exit) 98 % 99 % 98 % 95 % 94 %   Rating of Perceived Exertion (Exercise) _0 Perceived Dyspnea (Exercise) _1 Symptoms _2    Comments -- first day -- -- --   Duration -- Progress to 30 minutes of  aerobic without signs/symptoms of physical distress Progress to 30 minutes of  aerobic without signs/symptoms of physical distress Progress to 30 minutes of  aerobic without signs/symptoms of physical distress Progress to 30 minutes of  aerobic without signs/symptoms of physical distress   Intensity -- -- THRR unchanged THRR unchanged THRR unchanged     Progression   Progression -- Continue to progress workloads to maintain intensity without signs/symptoms of physical distress. Continue to progress workloads to maintain intensity without signs/symptoms of physical distress. Continue to progress workloads to maintain intensity without signs/symptoms of physical distress. Continue to progress workloads to maintain intensity without signs/symptoms of physical distress.   Average METs -- 2 2.19 2.32 2.2     Resistance Training   Training Prescription -- Yes Yes Yes Yes   Weight -- 2 lb 2 lb 2 lb 3 lb   Reps -- 10-15 10-15 10-15 10-15     Interval Training   Interval Training -- No No No No     Recumbant Bike   Level -- _3 --   RPM -- 60 -- -- --   Watts -- _4 --   Minutes -- _5 --   METs -- 1 2.38 -- --     NuStep   Level -- -- -- 1 2   SPM -- -- -- 80 80   Minutes -- -- -- 15 15    METs -- -- -- 2.3 2.3     Arm Ergometer   Level -- _6 --   Minutes -- _7 --   METs -- 2 1.9 -- --     REL-XR   Level -- -- -- 1 --   Minutes -- -- -- 15 --   METs -- -- -- 3.18 --     Biostep-RELP   Level -- -- -- -- 2   Minutes -- -- -- -- 15   METs -- -- -- -- 2          Exercise Comments:  Exercise Comments    Row Name 05/06/20 1152           Exercise Comments First full day of exercise!  Patient was oriented to gym and equipment including functions, settings, policies, and procedures.  Patient's individual exercise prescription and treatment plan were reviewed.  All starting workloads were established based on the results of the 6 minute walk test done at initial orientation visit.  The plan for exercise progression was also introduced and progression will be customized based on patient's performance and  goals.              Exercise Goals and Review:  Exercise Goals    Row Name 04/21/20 1617             Exercise Goals   Increase Physical Activity Yes       Intervention Provide advice, education, support and counseling about physical activity/exercise needs.;Develop an individualized exercise prescription for aerobic and resistive training based on initial evaluation findings, risk stratification, comorbidities and participant's personal goals.       Expected Outcomes Short Term: Attend rehab on a regular basis to increase amount of physical activity.;Long Term: Add in home exercise to make exercise part of routine and to increase amount of physical activity.;Long Term: Exercising regularly at least 3-5 days a week.       Increase Strength and Stamina Yes       Intervention Provide advice, education, support and counseling about physical activity/exercise needs.;Develop an individualized exercise prescription for aerobic and resistive training based on initial evaluation findings, risk stratification, comorbidities and participant's personal goals.        Expected Outcomes Short Term: Increase workloads from initial exercise prescription for resistance, speed, and METs.;Short Term: Perform resistance training exercises routinely during rehab and add in resistance training at home;Long Term: Improve cardiorespiratory fitness, muscular endurance and strength as measured by increased METs and functional capacity (6MWT)       Able to understand and use rate of perceived exertion (RPE) scale Yes       Intervention Provide education and explanation on how to use RPE scale       Expected Outcomes Short Term: Able to use RPE daily in rehab to express subjective intensity level;Long Term:  Able to use RPE to guide intensity level when exercising independently       Able to understand and use Dyspnea scale Yes       Intervention Provide education and explanation on how to use Dyspnea scale       Expected Outcomes Short Term: Able to use Dyspnea scale daily in rehab to express subjective sense of shortness of breath during exertion;Long Term: Able to use Dyspnea scale to guide intensity level when exercising independently       Knowledge and understanding of Target Heart Rate Range (THRR) Yes       Intervention Provide education and explanation of THRR including how the numbers were predicted and where they are located for reference       Expected Outcomes Short Term: Able to state/look up THRR;Short Term: Able to use daily as guideline for intensity in rehab;Long Term: Able to use THRR to govern intensity when exercising independently       Able to check pulse independently Yes       Intervention Provide education and demonstration on how to check pulse in carotid and radial arteries.;Review the importance of being able to check your own pulse for safety during independent exercise       Expected Outcomes Short Term: Able to explain why pulse checking is important during independent exercise;Long Term: Able to check pulse independently and accurately        Understanding of Exercise Prescription Yes       Intervention Provide education, explanation, and written materials on patient's individual exercise prescription       Expected Outcomes Short Term: Able to explain program exercise prescription;Long Term: Able to explain home exercise prescription to exercise independently  Exercise Goals Re-Evaluation :  Exercise Goals Re-Evaluation    Row Name 05/06/20 1154 05/21/20 1024 05/29/20 1143 06/03/20 0918 06/17/20 1131     Exercise Goal Re-Evaluation   Exercise Goals Review Able to understand and use rate of perceived exertion (RPE) scale;Able to understand and use Dyspnea scale;Knowledge and understanding of Target Heart Rate Range (THRR);Understanding of Exercise Prescription Increase Physical Activity;Increase Strength and Stamina;Understanding of Exercise Prescription Increase Physical Activity Increase Physical Activity;Understanding of Exercise Prescription;Increase Strength and Stamina Increase Physical Activity;Understanding of Exercise Prescription;Increase Strength and Stamina;Able to check pulse independently;Knowledge and understanding of Target Heart Rate Range (THRR);Able to understand and use Dyspnea scale;Able to understand and use rate of perceived exertion (RPE) scale   Comments Reviewed RPE and dyspnea scales, THR and program prescription with pt today.  Pt voiced understanding and was given a copy of goals to take home. Alondra is off to a good start in rehab.  She has completed her first two full days of exercise. We will continue to monitor her progress. When Laverna is not coming to rehab she is using her pedal bike that sits on the floor. She also uses it for her arms when she puts it on the kitchen counter. She exercises for about 20 minutes at a time. Jamilah is doing well in rehab. She has tried a variety of different machines. She is up to 15 watts on the Recumbant Bike. Will continue to monitor her progress. Reviewed  home exercise with pt today.  Pt plans to use equipment at home for exercise.  Reviewed THR, pulse, RPE, sign and symptoms, pulse oximetery and when to call 911 or MD.  Also discussed weather considerations and indoor options.  Pt voiced understanding.   Expected Outcomes Short: Use RPE daily to regulate intensity. Long: Follow program prescription in THR. Short: Return to regular attendance Long; Continue to follow program prescription Short: continue to increase workout activity at home. Long: maintain home exercises independently Short: Continue to increase workloads Long: Continue to increase strength/stamina Short: monitor HR and O2 when exercising at home Long: maintain exercise after completing [place] Name 06/19/20 0723 07/03/20 1150           Exercise Goal Re-Evaluation   Exercise Goals Review Increase Physical Activity;Increase Strength and Stamina;Understanding of Exercise Prescription Increase Physical Activity;Increase Strength and Stamina      Comments Inza is doing well in rehab.  She is still on level 1, but she is now up to 2 METs.  We will continue to encourage her to increase workloads and continue to monitor her progress. Jonique has moved up to level 2 and 3 lb for strength work.  Staff will monitor progress.      Expected Outcomes Short: Increase to level 2  Long; COnitnue to improve stamina. Short: continue to increase workloads when able Long: increase overall stamina             Discharge Exercise Prescription (Final Exercise Prescription Changes):  Exercise Prescription Changes - 07/03/20 1100      Response to Exercise   Blood Pressure (Admit) 112/60    Blood Pressure (Exercise) 126/64    Blood Pressure (Exit) 128/64    Heart Rate (Admit) 86 bpm    Heart Rate (Exercise) 109 bpm    Heart Rate (Exit) 87 bpm    Oxygen Saturation (Admit) 93 %    Oxygen Saturation (Exercise) 92 %    Oxygen Saturation (Exit) 94 %    Rating of Perceived  Exertion (Exercise) 13     Perceived Dyspnea (Exercise) 1    Symptoms none    Duration Progress to 30 minutes of  aerobic without signs/symptoms of physical distress    Intensity THRR unchanged      Progression   Progression Continue to progress workloads to maintain intensity without signs/symptoms of physical distress.    Average METs 2.2      Resistance Training   Training Prescription Yes    Weight 3 lb    Reps 10-15      Interval Training   Interval Training No      NuStep   Level 2    SPM 80    Minutes 15    METs 2.3      Biostep-RELP   Level 2    Minutes 15    METs 2           Nutrition:  Target Goals: Understanding of nutrition guidelines, daily intake of sodium <1551m, cholesterol <2057m calories 30% from fat and 7% or less from saturated fats, daily to have 5 or more servings of fruits and vegetables.  Education: Controlling Sodium/Reading Food Labels -Group verbal and written material supporting the discussion of sodium use in heart healthy nutrition. Review and explanation with models, verbal and written materials for utilization of the food label.   Education: General Nutrition Guidelines/Fats and Fiber: -Group instruction provided by verbal, written material, models and posters to present the general guidelines for heart healthy nutrition. Gives an explanation and review of dietary fats and fiber.   [facility]  Date 05/15/20  [facility] Review Code 1- Verbalizes Understanding      Biometrics:  Pre Biometrics - 04/21/20 1621      Pre Biometrics   Height 5' 2.75" (1.594 m)    Weight 146 lb 11.2 oz (66.5 kg)   self reported   BMI (Calculated) 26.19            Nutrition Therapy Plan and Nutrition Goals:  Nutrition Therapy & Goals - 06/17/20 1025      Nutrition Therapy   Diet Heart healthy, low Na, pulmonary MNT.    Protein (specify units) 80g    Fiber 25 grams    Whole Grain Foods 3 servings     Saturated Fats 12 max. grams    Fruits and Vegetables 5 servings/day    Sodium 1.5 grams      Personal Nutrition Goals   Nutrition Goal ST: Add vegetable servings LT: 6/7 breathing now, would like to improve SOB and lower O2 - 3L now. Would like to maintain weight.    Comments Deniece likes to eat seafood, pork and chicken. She normally doesn't cook, they go out to eat. Te[employer], olive garden, [place], cracker barrel, scrambled ([place]  2 meals per day- goes out for both meals. 11-12pm brunch. 5-6 dinner. Doesn't eat much eggs or dairy B/L: grits, sausage or turley sausage or biscuits or tu[place]wich or soup or sometimes salad. D: fish, oysters, shrimp, hamburger steak, pork chops, chicken [place] usually baked. Paired with rice, baked potato, sweet potato, brussels sprouts, cabbage, greens, salad, coleslaw. Drinks: water, unsweetened iced tea with lemon, diet pepsi or coke with lemon and coffee. Discussed heart healthy eating, pulmonary MNT. Reshunda does not want to eat at home, but her daughter BeLavella Hammockould like to move to doing that.      Intervention Plan   Intervention  Prescribe, educate and counsel regarding individualized specific dietary modifications aiming towards targeted core components such as weight, hypertension, lipid management, diabetes, heart failure and other comorbidities.;Nutrition handout(s) given to patient.    Expected Outcomes Short Term Goal: Understand basic principles of dietary content, such as calories, fat, sodium, cholesterol and nutrients.;Short Term Goal: A plan has been developed with personal nutrition goals set during dietitian appointment.;Long Term Goal: Adherence to prescribed nutrition plan.           Nutrition Assessments:  Nutrition Assessments - 04/21/20 1633      MEDFICTS Scores   Pre Score 18           MEDIFICTS Score Key:          ?70 Need to make dietary changes          40-70 Heart Healthy Diet         ? 40  Therapeutic Level Cholesterol Diet  Nutrition Goals Re-Evaluation:   Nutrition Goals Discharge (Final Nutrition Goals Re-Evaluation):   Psychosocial: Target Goals: Acknowledge presence or absence of significant depression and/or stress, maximize coping skills, provide positive support system. Participant is able to verbalize types and ability to use techniques and skills needed for reducing stress and depression.   Education: Depression - Provides group verbal and written instruction on the correlation between heart/lung disease and depressed mood, treatment options, and the stigmas associated with seeking treatment.   Pulmonary Rehab from 07/03/2020 in [facility] Cardiac and Pulmonary Rehab  Date 06/12/20  Educator [facility]  Instruction Review Code 1- Verbalizes Understanding      Education: Sleep Hygiene -Provides group verbal and written instruction about how sleep can affect your health.  Define sleep hygiene, discuss sleep cycles and impact of sleep habits. Review good sleep hygiene tips.    Education: Stress and Anxiety: - Provides group verbal and written instruction about the health risks of elevated stress and causes of high stress.  Discuss the correlation between heart/lung disease and anxiety and treatment options. Review healthy ways to manage with stress and anxiety.   Pulmonary Rehab from 07/03/2020 in [facility] Cardiac and Pulmonary Rehab  Date 06/12/20  Educator [facility]  Instruction Review Code 1- Verbalizes Understanding      Initial Review & Psychosocial Screening:  Initial Psych Review & Screening - 02/28/20 1121      Initial Review   Current issues with None Identified      Family Dynamics   Good Support System? Yes   Daughter, church family     Barriers   Psychosocial barriers to participate in program There are no identifiable barriers or psychosocial needs.      Screening Interventions   Interventions Encouraged to exercise    Expected Outcomes Short Term goal:  Utilizing psychosocial counselor, staff and physician to assist with identification of specific Stressors or current issues interfering with healing process. Setting desired goal for each stressor or current issue identified.;Long Term Goal: Stressors or current issues are controlled or eliminated.;Short Term goal: Identification and review with participant of any Quality of Life or Depression concerns found by scoring the questionnaire.;Long Term goal: The participant improves quality of Life and PHQ9 Scores as seen by post scores and/or verbalization of changes           Quality of Life Scores:  Scores of 19 and below usually indicate a poorer quality of life in these areas.  A difference of  2-3 points is a clinically meaningful difference.  A difference of 2-3 points in  the total score of the Quality of Life Index has been associated with significant improvement in overall quality of life, self-image, physical symptoms, and general health in studies assessing change in quality of life.  PHQ-9: Recent Review Flowsheet Data    Depression screen [facility] 2/9 04/21/2020 10/04/2019 10/04/2019 06/14/2019 03/29/2018   Decreased Interest 0 0 0 0 0   Down, Depressed, Hopeless 0 0 0 0 0   PHQ - 2 Score 0 0 0 0 0   Altered sleeping 0 0 - - -   Tired, decreased energy 0 0 - - -   Change in appetite 0 0 - - -   Feeling bad or failure about yourself  0 0 - - -   Trouble concentrating 0 0 - - -   Moving slowly or fidgety/restless 0 0 - - -   Suicidal thoughts 0 0 - - -   PHQ-9 Score 0 0 - - -   Difficult doing work/chores Not difficult at all Not difficult at all - - -     Interpretation of Total Score  Total Score Depression Severity:  1-4 = Minimal depression, 5-9 = Mild depression, 10-14 = Moderate depression, 15-19 = Moderately severe depression, 20-27 = Severe depression   Psychosocial Evaluation and Intervention:  Psychosocial Evaluation - 02/28/20 1133      Psychosocial Evaluation & Interventions    Interventions Encouraged to exercise with the program and follow exercise prescription    Comments Benzenaehibits no barriers to attending the program. She is ready to get started.  She has noticed that she is more shoort of breath than her usual. Could walk a hall before getting SOB, not it happens sooner. She lives in a home with her daughter [name]. She recently moved to [place] to live with her daughter. She wears oxygen at night and during the day when up and about. She voiced no concerns about the program, except what machines will she use and she does not feel she will do well on the treadmill. Grover should do well during her time here.    Expected Outcomes STG: Attends all scheduled sessions. LTG: Improvement in breathing during daily activities.    Continue Psychosocial Services  Follow up required by staff           Psychosocial Re-Evaluation:  Psychosocial Re-Evaluation    [place] Name 05/29/20 1145 06/17/20 1135           Psychosocial Re-Evaluation   Current issues with None Identified --      Comments Patient reports no issues with their current mental states, sleep, stress, depression or anxiety. Will follow up with patient in a few weeks for any changes. No chaneg or concerns with sleep, stress, etc.      Expected Outcomes Short: Continue to exercise regularly to support mental health and notify staff of any changes. Long: maintain mental health and well being through teaching of rehab or prescribed medications independently. Short: Continue to exercise regularly to support mental health and notify staff of any changes. Long: maintain mental health and well being through teaching of rehab or prescribed medications independently.      Interventions Encouraged to attend Pulmonary Rehabilitation for the exercise --      Continue Psychosocial Services  Follow up required by staff --             Psychosocial Discharge (Final Psychosocial Re-Evaluation):  Psychosocial Re-Evaluation -  06/17/20 1135      Psychosocial Re-Evaluation   Comments  No chaneg or concerns with sleep, stress, etc.    Expected Outcomes Short: Continue to exercise regularly to support mental health and notify staff of any changes. Long: maintain mental health and well being through teaching of rehab or prescribed medications independently.           Education: Education Goals: Education classes will be provided on a weekly basis, covering required topics. Participant will state understanding/return demonstration of topics presented.  Learning Barriers/Preferences:  Learning Barriers/Preferences - 02/28/20 1125      Learning Barriers/Preferences   Learning Barriers None    Learning Preferences Audio;Written Material           General Pulmonary Education Topics:  Infection Prevention: - Provides verbal and written material to individual with discussion of infection control including proper hand washing and proper equipment cleaning during exercise session.   Pulmonary Rehab from 07/03/2020 in [facility] Cardiac and Pulmonary Rehab  Date 04/21/20  Educator AS  Instruction Review Code 1- Verbalizes Understanding      Falls Prevention: - Provides verbal and written material to individual with discussion of falls prevention and safety.   Pulmonary Rehab from 07/03/2020 in [facility] Cardiac and Pulmonary Rehab  Date 04/21/20  Educator AS  Instruction Review Code 1- Verbalizes Understanding      Chronic Lung Diseases: - Group verbal and written instruction to review updates, respiratory medications, advancements in procedures and treatments. Discuss use of supplemental oxygen including available portable oxygen systems, continuous and intermittent flow rates, concentrators, personal use and safety guidelines. Review proper use of inhaler and spacers. Provide informative websites for self-education.    Pulmonary Rehab from 07/03/2020 in [facility] Cardiac and Pulmonary Rehab  Date 06/05/20  Educator [facility]   Instruction Review Code 1- Verbalizes Understanding      Energy Conservation: - Provide group verbal and written instruction for methods to conserve energy, plan and organize activities. Instruct on pacing techniques, use of adaptive equipment and posture/positioning to relieve shortness of breath.   Pulmonary Rehab from 07/03/2020 in [facility] Cardiac and Pulmonary Rehab  Date 06/05/20  Educator [facility]  Instruction Review Code 1- Verbalizes Understanding      Triggers and Exacerbations: - Group verbal and written instruction to review types of environmental triggers and ways to prevent exacerbations. Discuss weather changes, air quality and the benefits of nasal washing. Review warning signs and symptoms to help prevent infections. Discuss techniques for effective airway clearance, coughing, and vibrations.   Pulmonary Rehab from 07/03/2020 in [facility] Cardiac and Pulmonary Rehab  Date 06/05/20  Educator [facility]  Instruction Review Code 1- Verbalizes Understanding      AED/CPR: - Group verbal and written instruction with the use of models to demonstrate the basic use of the AED with the basic ABC's of resuscitation.   Anatomy and Physiology of the Lungs: - Group verbal and written instruction with the use of models to provide basic lung anatomy and physiology related to function, structure and complications of lung disease.   Pulmonary Rehab from 07/03/2020 in [facility] Cardiac and Pulmonary Rehab  Date 06/05/20  Educator [facility]  Instruction Review Code 1- Verbalizes Understanding      Anatomy & Physiology of the Heart: - Group verbal and written instruction and models provide basic cardiac anatomy and physiology, with the coronary electrical and arterial systems. Review of Valvular disease and Heart Failure   Pulmonary Rehab from 07/03/2020 in [facility] Cardiac and Pulmonary Rehab  Date 07/03/20  Educator SB  Instruction Review Code 1- Verbalizes Understanding  Cardiac Medications: - Group  verbal and written instruction to review commonly prescribed medications for heart disease. Reviews the medication, class of the drug, and side effects.   Pulmonary Rehab from 07/03/2020 in [facility] Cardiac and Pulmonary Rehab  Date 05/22/20  Educator SB  Instruction Review Code 1- Verbalizes Understanding      Other: -Provides group and verbal instruction on various topics (see comments)   Knowledge Questionnaire Score:  Knowledge Questionnaire Score - 04/21/20 1630      Knowledge Questionnaire Score   Pre Score 9/17            Core Components/Risk Factors/Patient Goals at Admission:  Personal Goals and Risk Factors at Admission - 04/21/20 1623      Core Components/Risk Factors/Patient Goals on Admission    Weight Management Yes    Intervention Weight Management: Develop a combined nutrition and exercise program designed to reach desired caloric intake, while maintaining appropriate intake of nutrient and fiber, sodium and fats, and appropriate energy expenditure required for the weight goal.;Weight Management: Provide education and appropriate resources to help participant work on and attain dietary goals.    Admit Weight 146 lb 11.2 oz (66.5 kg)    Goal Weight: Short Term 146 lb 11.2 oz (66.5 kg)    Goal Weight: Long Term 146 lb 11.2 oz (66.5 kg)    Expected Outcomes Short Term: Continue to assess and modify interventions until short term weight is achieved;Long Term: Adherence to nutrition and physical activity/exercise program aimed toward attainment of established weight goal;Weight Maintenance: Understanding of the daily nutrition guidelines, which includes 25-35% calories from fat, 7% or less cal from saturated fats, less than 279m cholesterol, less than 1.5gm of sodium, & 5 or more servings of fruits and vegetables daily           Education:Diabetes - Individual verbal and written instruction to review signs/symptoms of diabetes, desired ranges of glucose level fasting,  after meals and with exercise. Acknowledge that pre and post exercise glucose checks will be done for 3 sessions at entry of program.   Education: Know Your Numbers and Risk Factors: -Group verbal and written instruction about important numbers in your health.  Discussion of what are risk factors and how they play a role in the disease process.  Review of Cholesterol, Blood Pressure, Diabetes, and BMI and the role they play in your overall health.   [facility]  Date 05/29/20  Educator SB  Instruction Review Code 1- Verbalizes Understanding      Core Components/Risk Factors/Patient Goals Review:   Goals and Risk Factor Review    Row Name 05/29/20 1141 06/17/20 1133           Core Components/Risk Factors/Patient Goals Review   Personal Goals Review Weight Management/Obesity;Improve shortness of breath with ADL's Weight Management/Obesity;Improve shortness of breath with ADL's      Review Spoke to patient about their shortness of breath and what they can do to improve. Patient has been informed of breathing techniques when starting the program. Patient is informed to tell staff if they have had any med changes and that certain meds they are taking or not taking can be causing shortness of breath. BKimiahis taking meds as directed.  She feels her shortness of breath has improved.  She can walk further without getting out of breath.  She uses PLB when she remembers.      Expected Outcomes Short: Attend [name] regularly to improve  shortness of breath with ADL's. Long: maintain independence with ADL's Short:  continue to exercise Long: continue to improve SOB with ADLs             Core Components/Risk Factors/Patient Goals at Discharge (Final Review):   Goals and Risk Factor Review - 06/17/20 1133      Core Components/Risk Factors/Patient Goals Review   Personal Goals Review Weight Management/Obesity;Improve shortness of breath  with ADL's    Review Zabdi is taking meds as directed.  She feels her shortness of breath has improved.  She can walk further without getting out of breath.  She uses PLB when she remembers.    Expected Outcomes Short:  continue to exercise Long: continue to improve SOB with ADLs           ITP Comments:  ITP Comments    Row Name 02/28/20 1139 04/21/20 1639 05/06/20 1152 05/14/20 1606 06/11/20 0557   ITP Comments Virtual orientation call completed today. She has an appointment on Date: 03/04/2020 for EP eval and gym Orientation.  Documentation of diagnosis can be found in  Date: [facility] 02/18/2020. Completed 6MWT and gym orientation. Initial ITP created and sent for review to Dr. Emily Filbert, Medical Director. First full day of exercise!  Patient was oriented to gym and equipment including functions, settings, policies, and procedures.  Patient's individual exercise prescription and treatment plan were reviewed.  All starting workloads were established based on the results of the 6 minute walk test done at initial orientation visit.  The plan for exercise progression was also introduced and progression will be customized based on patient's performance and goals. 30 day review completed. ITP sent to Dr. Emily Filbert, Medical Director of Cardiac and Pulmonary Rehab. Continue with ITP unless changes are made by physician. 30 Day review completed. Medical Director ITP review done, changes made as directed, and signed approval by Medical Director.   [place] Name 07/09/20 0656           ITP Comments 30 Day review completed. Medical Director ITP review done, changes made as directed, and signed approval by Medical Director.              Comments:

## 2020-07-10 ENCOUNTER — Encounter: Payer: Medicare Other | Admitting: *Deleted

## 2020-07-10 ENCOUNTER — Other Ambulatory Visit: Payer: Self-pay

## 2020-07-10 DIAGNOSIS — J439 Emphysema, unspecified: Secondary | ICD-10-CM | POA: Diagnosis not present

## 2020-07-10 NOTE — Progress Notes (Signed)
Daily Session Note  Patient Details  Name: Brianna Adams MRN: [id number] Date of Birth: 1940/12/17 Referring Provider:     Pulmonary Rehab from 04/21/2020 in [facility] Cardiac and Pulmonary Rehab  Referring Provider [name]      Encounter Date: 07/10/2020  Check In:  Session Check In - 07/10/20 1113      Check-In   Supervising physician immediately available to respond to emergencies See telemetry face sheet for immediately available ER MD    Location [facility]-Cardiac & Pulmonary Rehab    Staff Present Hope Budds RDN, LDN;Deema Juncaj Sherryll Burger, RN BSN;Joseph Hood RCP,RRT,BSRT;Amanda Oletta Darter, BA, ACSM CEP, Exercise Physiologist;Jessica Kittanning, MA, RCEP, CCRP, CCET    Virtual Visit No    Medication changes reported     No    Fall or balance concerns reported    No    Warm-up and Cool-down Performed on first and last piece of equipment    Resistance Training Performed Yes    VAD Patient? No    PAD/SET Patient? No      Pain Assessment   Currently in Pain? No/denies              Social History   Tobacco Use  Smoking Status Former Smoker  . Packs/day: 1.00  . Years: 58.00  . Pack years: 58.00  . Types: Cigarettes  . Quit date: 10/22/2015  . Years since quitting: 4.7  Smokeless Tobacco Never Used    Goals Met:  Independence with exercise equipment Exercise tolerated well No report of cardiac concerns or symptoms Strength training completed today  Goals Unmet:  Not Applicable  Comments: Pt able to follow exercise prescription today without complaint.  Will continue to monitor for progression.    Dr. Emily Filbert is Medical Director for [facility] and [name].

## 2020-07-15 ENCOUNTER — Encounter: Payer: Medicare Other | Admitting: *Deleted

## 2020-07-15 ENCOUNTER — Other Ambulatory Visit: Payer: Self-pay

## 2020-07-15 DIAGNOSIS — J439 Emphysema, unspecified: Secondary | ICD-10-CM

## 2020-07-15 NOTE — Progress Notes (Signed)
Daily Session Note  Patient Details  Name: Verdis Bassette MRN: [id number] Date of Birth: [birth year] Referring Provider:     Pulmonary Rehab from 04/21/2020 in [facility] Cardiac and Pulmonary Rehab  Referring Provider [name]      Encounter Date: 07/15/2020  Check In:  Session Check In - 07/15/20 1133      Check-In   Supervising physician immediately available to respond to emergencies See telemetry face sheet for immediately available ER MD    Location [facility]-Cardiac & Pulmonary Rehab    Staff Present Justin Mend RCP,RRT,BSRT;Melissa Caiola RDN, Rowe Pavy, BA, ACSM CEP, Exercise Physiologist;Kamica Florance, RN, BSN, CCRP    Virtual Visit No    Medication changes reported     No    Fall or balance concerns reported    No    Warm-up and Cool-down Performed on first and last piece of equipment    Resistance Training Performed Yes    VAD Patient? No    PAD/SET Patient? No      Pain Assessment   Currently in Pain? No/denies              Social History   Tobacco Use  Smoking Status Former Smoker  . Packs/day: 1.00  . Years: 58.00  . Pack years: 58.00  . Types: Cigarettes  . Quit date: 10/22/2015  . Years since quitting: 4.7  Smokeless Tobacco Never Used    Goals Met:  Proper associated with RPD/PD & O2 Sat Independence with exercise equipment Exercise tolerated well No report of cardiac concerns or symptoms  Goals Unmet:  Not Applicable  Comments: Pt able to follow exercise prescription today without complaint.  Will continue to monitor for progression.    Dr. Emily Filbert is Medical Director for [place] and [name].

## 2020-07-17 ENCOUNTER — Other Ambulatory Visit: Payer: Self-pay

## 2020-07-17 DIAGNOSIS — J439 Emphysema, unspecified: Secondary | ICD-10-CM | POA: Diagnosis not present

## 2020-07-17 NOTE — Progress Notes (Signed)
Daily Session Note  Patient Details  Name: Brianna Adams MRN: [id number] Date of Birth: 01/17/1941 Referring Provider:     Pulmonary Rehab from 04/21/2020 in [facility] Cardiac and Pulmonary Rehab  Referring Provider [name]      Encounter Date: 07/17/2020  Check In:  Session Check In - 07/17/20 1124      Check-In   Supervising physician immediately available to respond to emergencies See telemetry face sheet for immediately available ER MD    Location [facility] & Pulmonary Re[facility]    Staff Present Birdie Sons, MPA, RN;Meredith Sherryll Burger, RN BSN;Joseph Darrin Nipper, [place], RCEP, CCRP, CCET    Virtual Visit No    Medication changes reported     No    Fall or balance concerns reported    No    Warm-up and Cool-down Performed on first and last piece of equipment    Resistance Training Performed Yes    VAD Patient? No    PAD/SET Patient? No      Pain Assessment   Currently in Pain? No/denies              Social History   Tobacco Use  Smoking Status Former Smoker  . Packs/day: 1.00  . Years: 58.00  . Pack years: 58.00  . Types: Cigarettes  . Quit date: 10/22/2015  . Years since quitting: 4.7  Smokeless Tobacco Never Used    Goals Met:  Independence with exercise equipment Exercise tolerated well No report of cardiac concerns or symptoms Strength training completed today  Goals Unmet:  Not Applicable  Comments: Pt able to follow exercise prescription today without complaint.  Will continue to monitor for progression.    Dr. Emily Filbert is Medical Director for [place] and [name].

## 2020-07-22 ENCOUNTER — Encounter: Payer: Medicare Other | Admitting: *Deleted

## 2020-07-22 ENCOUNTER — Other Ambulatory Visit: Payer: Self-pay

## 2020-07-22 DIAGNOSIS — J439 Emphysema, unspecified: Secondary | ICD-10-CM

## 2020-07-22 NOTE — Progress Notes (Signed)
Daily Session Note  Patient Details  Name: Selma Mink MRN: [id number] Date of Birth: 09/13/1940 Referring Provider:     Pulmonary Rehab from 04/21/2020 in [facility] Cardiac and Pulmonary Rehab  Referring Provider [name]      Encounter Date: 07/22/2020  Check In:  Session Check In - 07/22/20 1134      Check-In   Supervising physician immediately available to respond to emergencies See telemetry face sheet for immediately available ER MD    Location [facility] & Pulmonary Re[facility]    Staff Present Heath Lark, RN, BSN, CCRP;Amanda Sommer, BA, ACSM CEP, Exercise Physiologist;Joseph Hood RCP,RRT,BSRT;Melissa Caiola RDN, LDN    Virtual Visit No    Medication changes reported     No    Fall or balance concerns reported    No    Warm-up and Cool-down Performed on first and last piece of equipment    Resistance Training Performed Yes    VAD Patient? No    PAD/SET Patient? No      Pain Assessment   Currently in Pain? No/denies              Social History   Tobacco Use  Smoking Status Former Smoker  . Packs/day: 1.00  . Years: 58.00  . Pack years: 58.00  . Types: Cigarettes  . Quit date: 10/22/2015  . Years since quitting: 4.7  Smokeless Tobacco Never Used    Goals Met:  Proper associated with RPD/PD & O2 Sat Independence with exercise equipment Exercise tolerated well No report of cardiac concerns or symptoms  Goals Unmet:  Not Applicable  Comments: Pt able to follow exercise prescription today without complaint.  Will continue to monitor for progression.    Dr. Emily Filbert is Medical Director for [place] and [name].

## 2020-07-24 ENCOUNTER — Other Ambulatory Visit: Payer: Self-pay

## 2020-07-24 DIAGNOSIS — J439 Emphysema, unspecified: Secondary | ICD-10-CM | POA: Diagnosis not present

## 2020-07-24 NOTE — Progress Notes (Signed)
Daily Session Note  Patient Details  Name: Brianna Adams MRN: [id number] Date of Birth: Aug 26, 1941 Referring Provider:     Pulmonary Rehab from 04/21/2020 in [facility] Cardiac and Pulmonary Rehab  Referring Provider [name]      Encounter Date: 07/24/2020  Check In:  Session Check In - 07/24/20 1046      Check-In   Supervising physician immediately available to respond to emergencies See telemetry face sheet for immediately available ER MD    Location [facility]-Cardiac & Pulmonary Rehab    Staff Present Birdie Sons, MPA, RN;Melissa Caiola RDN, Rowe Pavy, BA, ACSM CEP, Exercise Physiologist;Jessica [place], MA, RCEP, CCRP, CCET    Virtual Visit No    Medication changes reported     No    Fall or balance concerns reported    No    Warm-up and Cool-down Performed on first and last piece of equipment    Resistance Training Performed Yes    VAD Patient? No    PAD/SET Patient? No      Pain Assessment   Currently in Pain? No/denies              Social History   Tobacco Use  Smoking Status Former Smoker  . Packs/day: 1.00  . Years: 58.00  . Pack years: 58.00  . Types: Cigarettes  . Quit date: 10/22/2015  . Years since quitting: 4.7  Smokeless Tobacco Never Used    Goals Met:  Independence with exercise equipment Exercise tolerated well No report of cardiac concerns or symptoms Strength training completed today  Goals Unmet:  Not Applicable  Comments: Pt able to follow exercise prescription today without complaint.  Will continue to monitor for progression.    Dr. Emily Filbert is Medical Director for [place] and [name].

## 2020-07-29 ENCOUNTER — Other Ambulatory Visit: Payer: Self-pay

## 2020-07-29 ENCOUNTER — Encounter: Payer: Medicare Other | Admitting: *Deleted

## 2020-07-29 DIAGNOSIS — J439 Emphysema, unspecified: Secondary | ICD-10-CM | POA: Diagnosis not present

## 2020-07-29 NOTE — Progress Notes (Signed)
Daily Session Note  Patient Details  Name: Brianna Adams MRN: [id number] Date of Birth: [birth year] Referring Provider:     Pulmonary Rehab from 04/21/2020 in [facility] Cardiac and Pulmonary Rehab  Referring Provider [name]      Encounter Date: 07/29/2020  Check In:  Session Check In - 07/29/20 1413      Check-In   Supervising physician immediately available to respond to emergencies See telemetry face sheet for immediately available ER MD    Location [facility]-Cardiac & Pulmonary Rehab    Staff Present Alberteen Sam, MA, RCEP, CCRP, CCET;Jaquala Fuller, RN, BSN, Jacklynn Bue, MS Exercise Physiologist;Amanda Oletta Darter, [place], ACSM CEP, Exercise Physiologist    Virtual Visit No    Medication changes reported     No    Fall or balance concerns reported    No    Warm-up and Cool-down Performed on first and last piece of equipment    Resistance Training Performed Yes    VAD Patient? No    PAD/SET Patient? No      Pain Assessment   Currently in Pain? No/denies              Social History   Tobacco Use  Smoking Status Former Smoker  . Packs/day: 1.00  . Years: 58.00  . Pack years: 58.00  . Types: Cigarettes  . Quit date: 10/22/2015  . Years since quitting: 4.7  Smokeless Tobacco Never Used    Goals Met:  Proper associated with RPD/PD & O2 Sat Independence with exercise equipment Exercise tolerated well No report of cardiac concerns or symptoms  Goals Unmet:  Not Applicable  Comments: Pt able to follow exercise prescription today without complaint.  Will continue to monitor for progression.    Dr. Emily Filbert is Medical Director for [place] and [name].

## 2020-08-06 ENCOUNTER — Encounter: Payer: Self-pay | Admitting: *Deleted

## 2020-08-06 DIAGNOSIS — J439 Emphysema, unspecified: Secondary | ICD-10-CM

## 2020-08-06 NOTE — Progress Notes (Signed)
Pulmonary Individual Treatment Plan  Patient Details  Name: Brianna Adams MRN: [id number] Date of Birth: 09-Aug-1941 Referring Provider:     Pulmonary Rehab from 04/21/2020 in [facility] Cardiac and Pulmonary Rehab  Referring Provider [name]      Initial Encounter Date:    Pulmonary Rehab from 04/21/2020 in [facility] Cardiac and Pulmonary Rehab  Date 04/21/20      Visit Diagnosis: Pulmonary emphysema, unspecified emphysema type ([place])  Patient's Home Medications on Admission:  Current Outpatient Medications:  .  acetaminophen (TYLENOL) 500 MG tablet, Take 1,000 mg by mouth every 6 (six) hours as needed for mild pain or fever. , Disp: , Rfl:  .  albuterol (VENTOLIN HFA) 108 (90 Base) MCG/ACT inhaler, Inhale 2 puffs into the lungs every 6 (six) hours as needed for wheezing or shortness of breath., Disp: 18 g, Rfl: 0 .  apixaban (ELIQUIS) 5 MG TABS tablet, Take 1 tablet (5 mg total) by mouth 2 (two) times daily., Disp: 90 tablet, Rfl: 1 .  calcium carbonate (OSCAL) 1500 (600 Ca) MG TABS tablet, Take 1,200 mg by mouth daily., Disp: , Rfl:  .  Cinnamon 500 MG TABS, Take 1,000 mg by mouth daily. , Disp: , Rfl:  .  furosemide (LASIX) 20 MG tablet, TAKE 1 TABLET BY MOUTH EVERY DAY (Patient taking differently: Take 20 mg by mouth daily. ), Disp: 90 tablet, Rfl: 2 .  metFORMIN (GLUCOPHAGE) 1000 MG tablet, TAKE 1 TABLET (1,000 MG TOTAL) BY MOUTH 2 (TWO) TIMES DAILY WITH A MEAL., Disp: 180 tablet, Rfl: 0 .  Multiple Vitamins-Minerals (CENTRUM SILVER PO), Take 1 tablet by mouth daily. , Disp: , Rfl:  .  omeprazole (PRILOSEC) 40 MG capsule, Take 1 capsule (40 mg total) by mouth daily., Disp: 90 capsule, Rfl: 1 .  pravastatin (PRAVACHOL) 10 MG tablet, TAKE 1 TABLET BY MOUTH EVERY DAY (Patient taking differently: Take 10 mg by mouth daily. ), Disp: 90 tablet, Rfl: 1 .  umeclidinium-vilanterol (ANORO ELLIPTA) 62.5-25 MCG/INH AEPB, Inhale 1 puff into the lungs daily. , Disp: , Rfl:  .  vitamin C (ASCORBIC ACID) 500  MG tablet, Take 500 mg by mouth daily. , Disp: , Rfl:   Past Medical History: Past Medical History:  Diagnosis Date  . CHF (congestive heart failure) ([place])   . COPD (chronic obstructive pulmonary disease) ([place])   . Diabetes ([place])   . Diverticulitis   . High cholesterol   . Lung cancer ([place])   . Pulmonary hypertension ([place])   . Uterine cancer ([place])     Tobacco Use: Social History   Tobacco Use  Smoking Status Former Smoker  . Packs/day: 1.00  . Years: 58.00  . Pack years: 58.00  . Types: Cigarettes  . Quit date: 10/22/2015  . Years since quitting: 4.7  Smokeless Tobacco Never Used    Labs: Recent Review Flowsheet Data    Labs for ITP Cardiac and Pulmonary Rehab Latest Ref Rng & Units 06/14/2019 10/04/2019 01/07/2020 02/29/2020 05/13/2020   Cholestrol 100 - 199 mg/dL - 152 149 - 168   LDLCALC 0 - 99 mg/dL - 80 73 - 94   HDL >39 mg/dL - 57 61 - 61   Trlycerides 0 - 149 mg/dL - 76 80 - 67   Hemoglobin A1c 4.8 - 5.6 % 6.3(H) 6.2(H) 5.8(H) - 6.0(H)   HCO3 20.0 - 28.0 mmol/L - - - 19.0(L) -   ACIDBASEDEF 0.0 - 2.0 mmol/L - - - 4.0(H) -   O2SAT % - - -  99.6 -       Pulmonary Assessment Scores:  Pulmonary Assessment Scores    Row Name 04/21/20 1628         ADL UCSD   SOB Score total 68     Rest 0     Walk 3     Stairs 4     Bath 2     Dress 1     Shop 3       CAT Score   CAT Score 22       mMRC Score   mMRC Score 2            UCSD: Self-administered rating of dyspnea associated with activities of daily living (ADLs) 6-point scale (0 = "not at all" to 5 = "maximal or unable to do because of breathlessness")  Scoring Scores range from 0 to 120.  Minimally important difference is 5 units  CAT: CAT can identify the health impairment of COPD patients and is better correlated with disease progression.  CAT has a scoring range of zero to 40. The CAT score is classified into four groups of low (less than 10), medium (10 - 20), high (21-30) and very high (31-40) based  on the impact level of disease on health status. A CAT score over 10 suggests significant symptoms.  A worsening CAT score could be explained by an exacerbation, poor medication adherence, poor inhaler technique, or progression of COPD or comorbid conditions.  CAT MCID is 2 points  mMRC: mMRC (Modified Medical Research Council) Dyspnea Scale is used to assess the degree of baseline functional disability in patients of respiratory disease due to dyspnea. No minimal important difference is established. A decrease in score of 1 point or greater is considered a positive change.   Pulmonary Function Assessment:   Exercise Target Goals: Exercise Program Goal: Individual exercise prescription set using results from initial 6 min walk test and THRR while considering  patient's activity barriers and safety.   Exercise Prescription Goal: Initial exercise prescription builds to 30-45 minutes a day of aerobic activity, 2-3 days per week.  Home exercise guidelines will be given to patient during program as part of exercise prescription that the participant will acknowledge.  Education: Aerobic Exercise: - Group verbal and visual presentation on the components of exercise prescription. Introduces F.I.T.T principle from ACSM for exercise prescriptions.  Reviews F.I.T.T. principles of aerobic exercise including progression. Written material given at graduation.   Pulmonary Rehab from 07/29/2020 in [facility] Cardiac and Pulmonary Rehab  Date 07/03/20  Hilda Blades only on 10/28]  Educator [facility]  Instruction Review Code 1- [employer] Understanding      Education: Resistance Exercise: - Group verbal and visual presentation on the components of exercise prescription. Introduces F.I.T.T principle from ACSM for exercise prescriptions  Reviews F.I.T.T. principles of resistance exercise including progression. Written material given at graduation.    Education: Exercise & Equipment Safety: - Individual verbal  instruction and demonstration of equipment use and safety with use of the equipment.   Pulmonary Rehab from 07/29/2020 in [facility] Cardiac and Pulmonary Rehab  Date 04/21/20  Educator AS  Instruction Review Code 1- Verbalizes Understanding      Education: Exercise Physiology & General Exercise Guidelines: - Group verbal and written instruction with models to review the exercise physiology of the cardiovascular system and associated critical values. Provides general exercise guidelines with specific guidelines to those with heart or lung disease.    Pulmonary Rehab from 07/29/2020 in [facility] Cardiac and Pulmonary Rehab  Date 06/19/20  Educator [facility]  Instruction Review Code 1- [employer] Understanding      Education: Flexibility, Balance, Mind/Body Relaxation: - Group verbal and visual presentation with interactive activity on the components of exercise prescription. Introduces F.I.T.T principle from ACSM for exercise prescriptions. Reviews F.I.T.T. principles of flexibility and balance exercise training including progression. Also discusses the mind body connection.  Reviews various relaxation techniques to help reduce and manage stress (i.e. Deep breathing, progressive muscle relaxation, and visualization). Balance handout provided to take home. Written material given at graduation.   Pulmonary Rehab from 07/29/2020 in [facility] Cardiac and Pulmonary Rehab  Date 07/10/20  Educator AS  Instruction Review Code 1- Verbalizes Understanding      Activity Barriers & Risk Stratification:  Activity Barriers & Cardiac Risk Stratification - 02/28/20 1107      Activity Barriers & Cardiac Risk Stratification   Activity Barriers None           6 Minute Walk:  6 Minute Walk    Row Name 04/21/20 1609         6 Minute Walk   Phase Initial     Distance 380 feet     Walk Time 4.5 minutes     # of Rest Breaks 3     MPH 0.95     METS 0.8     RPE 11     Perceived Dyspnea  2     VO2 Peak 2.75      Symptoms No     Resting HR 85 bpm     Resting BP 120/60     Resting Oxygen Saturation  98 %     Exercise Oxygen Saturation  during 6 min walk 90 %     Max Ex. HR 103 bpm     Max Ex. BP 134/66     2 Minute Post BP 130/62       Interval HR   1 Minute HR 102     2 Minute HR 93     3 Minute HR 100     4 Minute HR 100     5 Minute HR 101     6 Minute HR 103     2 Minute Post HR 82     Interval Heart Rate? Yes       Interval Oxygen   Interval Oxygen? Yes     Baseline Oxygen Saturation % 98 %     1 Minute Oxygen Saturation % 92 %     1 Minute Liters of Oxygen 3 L     2 Minute Oxygen Saturation % 90 %     2 Minute Liters of Oxygen 3 L     3 Minute Oxygen Saturation % 94 %     3 Minute Liters of Oxygen 3 L     4 Minute Oxygen Saturation % 90 %     4 Minute Liters of Oxygen 3 L     5 Minute Oxygen Saturation % 93 %     5 Minute Liters of Oxygen 3 L     6 Minute Oxygen Saturation % 93 %     6 Minute Liters of Oxygen 3 L     2 Minute Post Oxygen Saturation % 98 %     2 Minute Post Liters of Oxygen 3 L           Oxygen Initial Assessment:  Oxygen Initial Assessment - 05/06/20 1152      Home Oxygen  Home Oxygen Device Portable Concentrator;Home Concentrator    Sleep Oxygen Prescription Continuous    Liters per minute 2    Home Exercise Oxygen Prescription None    Home Resting Oxygen Prescription Continuous    Liters per minute -1    Compliance with Home Oxygen Use Yes      Intervention   Short Term Goals To learn and exhibit compliance with exercise, home and travel O2 prescription;To learn and understand importance of monitoring SPO2 with pulse oximeter and demonstrate accurate use of the pulse oximeter.;To learn and understand importance of maintaining oxygen saturations>88%;To learn and demonstrate proper pursed lip breathing techniques or other breathing techniques.;To learn and demonstrate proper use of respiratory medications    Long  Term Goals Verbalizes importance  of monitoring SPO2 with pulse oximeter and return demonstration;Exhibits proper breathing techniques, such as pursed lip breathing or other method taught during program session;Maintenance of O2 saturations>88%;Exhibits compliance with exercise, home and travel O2 prescription;Demonstrates proper use of MDI's;Compliance with respiratory medication           Oxygen Re-Evaluation:  Oxygen Re-Evaluation    [place] Name 05/06/20 1153 05/29/20 1137 06/17/20 1136 07/17/20 1116       Program Oxygen Prescription   Program Oxygen Prescription -- Continuous;E-Tanks Continuous;E-Tanks Continuous;E-Tanks    Liters per minute -- 3 -- 3      Home Oxygen   Home Oxygen Device -- Home Concentrator;Portable Concentrator;E-Tanks Home Concentrator;Portable Concentrator;E-Tanks Home Concentrator;Portable Concentrator;E-Tanks    Sleep Oxygen Prescription -- Continuous Continuous Continuous    Liters per minute -- _0 Home Exercise Oxygen Prescription -- Continuous Continuous Continuous    Liters per minute -- _1 Home Resting Oxygen Prescription -- Continuous Continuous Continuous    Liters per minute -- _2 Compliance with Home Oxygen Use -- Yes Yes Yes      Goals/Expected Outcomes   Short Term Goals To learn and demonstrate proper pursed lip breathing techniques or other breathing techniques. To learn and exhibit compliance with exercise, home and travel O2 prescription;To learn and understand importance of monitoring SPO2 with pulse oximeter and demonstrate accurate use of the pulse oximeter.;To learn and understand importance of maintaining oxygen saturations>88%;To learn and demonstrate proper use of respiratory medications;To learn and demonstrate proper pursed lip breathing techniques or other breathing techniques. To learn and exhibit compliance with exercise, home and travel O2 prescription;To learn and understand importance of monitoring SPO2 with pulse oximeter and demonstrate accurate  use of the pulse oximeter.;To learn and understand importance of maintaining oxygen saturations>88%;To learn and demonstrate proper use of respiratory medications;To learn and demonstrate proper pursed lip breathing techniques or other breathing techniques. To learn and exhibit compliance with exercise, home and travel O2 prescription;To learn and understand importance of monitoring SPO2 with pulse oximeter and demonstrate accurate use of the pulse oximeter.;To learn and understand importance of maintaining oxygen saturations>88%;To learn and demonstrate proper use of respiratory medications;To learn and demonstrate proper pursed lip breathing techniques or other breathing techniques.    Long  Term Goals Exhibits proper breathing techniques, such as pursed lip breathing or other method taught during program session Exhibits compliance with exercise, home and travel O2 prescription;Exhibits proper breathing techniques, such as pursed lip breathing or other method taught during program session;Verbalizes importance of monitoring SPO2 with pulse oximeter and return demonstration;Demonstrates proper use of MDI's;Maintenance of O2 saturations>88%;Compliance with respiratory medication Exhibits compliance with exercise, home and travel O2  prescription;Exhibits proper breathing techniques, such as pursed lip breathing or other method taught during program session;Verbalizes importance of monitoring SPO2 with pulse oximeter and return demonstration;Demonstrates proper use of MDI's;Maintenance of O2 saturations>88%;Compliance with respiratory medication Exhibits compliance with exercise, home and travel O2 prescription;Exhibits proper breathing techniques, such as pursed lip breathing or other method taught during program session;Verbalizes importance of monitoring SPO2 with pulse oximeter and return demonstration;Demonstrates proper use of MDI's;Maintenance of O2 saturations>88%;Compliance with respiratory medication     Comments Reviewed PLB technique with pt.  Talked about how it works and it's importance in maintaining their exercise saturations. Informed patient how to perform the Pursed Lipped breathing technique. Told patient to Inhale through the nose and out the mouth with pursed lips to keep their airways open, help oxygenate them better, practice when at rest or doing strenuous activity. Patient Verbalizes understanding of technique and will work on and be reiterated during [place]. Pt uses PLB when she remembers.  Sh eis feeling like SOB has improved since starting LW program. Lynne is doing well with her oxygen therapy.  She is using her PLB when she remembers.  She has noted that her breathing has been improving since starting the program.  She continues to do well on her respiratory meds too.    Goals/Expected Outcomes Short: Become more profiecient at using PLB.   Long: Become independent at using PLB. Short: use PLB with exertion. Long: use PLB on exertion proficiently and independently Short: continue to be compliant with O2 and PLB Long: use PLB as needed Short: continue to be compliant with O2 and PLB Long: use PLB as needed           Oxygen Discharge (Final Oxygen Re-Evaluation):  Oxygen Re-Evaluation - 07/17/20 1116      Program Oxygen Prescription   Program Oxygen Prescription Continuous;E-Tanks    Liters per minute 3      Home Oxygen   Home Oxygen Device Home Concentrator;Portable Concentrator;E-Tanks    Sleep Oxygen Prescription Continuous    Liters per minute 3    Home Exercise Oxygen Prescription Continuous    Liters per minute 3    Home Resting Oxygen Prescription Continuous    Liters per minute 3    Compliance with Home Oxygen Use Yes      Goals/Expected Outcomes   Short Term Goals To learn and exhibit compliance with exercise, home and travel O2 prescription;To learn and understand importance of monitoring SPO2 with pulse oximeter and demonstrate accurate use of the pulse  oximeter.;To learn and understand importance of maintaining oxygen saturations>88%;To learn and demonstrate proper use of respiratory medications;To learn and demonstrate proper pursed lip breathing techniques or other breathing techniques.    Long  Term Goals Exhibits compliance with exercise, home and travel O2 prescription;Exhibits proper breathing techniques, such as pursed lip breathing or other method taught during program session;Verbalizes importance of monitoring SPO2 with pulse oximeter and return demonstration;Demonstrates proper use of MDI's;Maintenance of O2 saturations>88%;Compliance with respiratory medication    Comments [place] is doing well with her oxygen therapy.  She is using her PLB when she remembers.  She has noted that her breathing has been improving since starting the program.  She continues to do well on her respiratory meds too.    Goals/Expected Outcomes Short: continue to be compliant with O2 and PLB Long: use PLB as needed           Initial Exercise Prescription:  Initial Exercise Prescription - 04/21/20 1600  Date of Initial Exercise RX and Referring Provider   Date 04/21/20    Referring Provider [name]      Treadmill   MPH 0.8    Grade 0    Minutes 15    METs 1      Recumbant Bike   Level 1    RPM 60    Watts 5    Minutes 15    METs 1      NuStep   Level 1    SPM 80    Minutes 15    METs 1      Arm Ergometer   Level 1    RPM 25    Minutes 15    METs 1      Recumbant Elliptical   Level 1    RPM 30    Minutes 15    METs 1      Prescription Details   Frequency (times per week) 3    Duration Progress to 30 minutes of continuous aerobic without signs/symptoms of physical distress      Intensity   THRR 40-80% of Max Heartrate 107-129    Ratings of Perceived Exertion 11-13    Perceived Dyspnea 0-4      Resistance Training   Training Prescription Yes    Weight 2 lb    Reps 10-15           Perform Capillary Blood  Glucose checks as needed.  Exercise Prescription Changes:  Exercise Prescription Changes    Row Name 04/21/20 1600 05/06/20 1500 05/21/20 1000 06/03/20 0900 07/03/20 1100     Response to Exercise   Blood Pressure (Admit) 120/60 124/64 122/60 142/74 112/60   Blood Pressure (Exercise) 134/66 128/70 146/64 146/70 126/64   Blood Pressure (Exit) 130/62 122/62 138/70 100/58 128/64   Heart Rate (Admit) 85 bpm 97 bpm 84 bpm 73 bpm 86 bpm   Heart Rate (Exercise) 103 bpm 100 bpm 114 bpm 108 bpm 109 bpm   Heart Rate (Exit) 82 bpm 90 bpm 95 bpm 82 bpm 87 bpm   Oxygen Saturation (Admit) 98 % 89 % 99 % 100 % 93 %   Oxygen Saturation (Exercise) 90 % 96 % 92 % 92 % 92 %   Oxygen Saturation (Exit) 98 % 99 % 98 % 95 % 94 %   Rating of Perceived Exertion (Exercise) _0 Perceived Dyspnea (Exercise) _1 Symptoms _2    Comments -- first day -- -- --   Duration -- Progress to 30 minutes of  aerobic without signs/symptoms of physical distress Progress to 30 minutes of  aerobic without signs/symptoms of physical distress Progress to 30 minutes of  aerobic without signs/symptoms of physical distress Progress to 30 minutes of  aerobic without signs/symptoms of physical distress   Intensity -- -- THRR unchanged THRR unchanged THRR unchanged     Progression   Progression -- Continue to progress workloads to maintain intensity without signs/symptoms of physical distress. Continue to progress workloads to maintain intensity without signs/symptoms of physical distress. Continue to progress workloads to maintain intensity without signs/symptoms of physical distress. Continue to progress workloads to maintain intensity without signs/symptoms of physical distress.   Average METs -- 2 2.19 2.32 2.2     Resistance Training   Training Prescription -- Yes Yes Yes Yes   Weight -- 2 lb 2 lb 2 lb 3 lb   Reps -- 10-15  10-15 10-15 10-15     Interval Training   Interval Training -- No  No No No     Recumbant Bike   Level -- _0 --   RPM -- 60 -- -- --   Watts -- _1 --   Minutes -- _2 --   METs -- 1 2.38 -- --     NuStep   Level -- -- -- 1 2   SPM -- -- -- 80 80   Minutes -- -- -- 15 15   METs -- -- -- 2.3 2.3     Arm Ergometer   Level -- _3 --   Minutes -- _4 --   METs -- 2 1.9 -- --     REL-XR   Level -- -- -- 1 --   Minutes -- -- -- 15 --   METs -- -- -- 3.18 --     Biostep-RELP   Level -- -- -- -- 2   Minutes -- -- -- -- 15   METs -- -- -- -- 2   Row Name 07/17/20 0700 07/30/20 1200           Response to Exercise   Blood Pressure (Admit) 112/60 102/60      Blood Pressure (Exercise) 134/70 134/58      Blood Pressure (Exit) 122/62 108/60      Heart Rate (Admit) 78 bpm 82 bpm      Heart Rate (Exercise) 100 bpm 102 bpm      Heart Rate (Exit) 90 bpm 89 bpm      Oxygen Saturation (Admit) 98 % 99 %      Oxygen Saturation (Exercise) 91 % 93 %      Oxygen Saturation (Exit) 97 % 99 %      Rating of Perceived Exertion (Exercise) 13 13      Perceived Dyspnea (Exercise) 0 0      Symptoms none none      Duration Continue with 30 min of aerobic exercise without signs/symptoms of physical distress. Continue with 30 min of aerobic exercise without signs/symptoms of physical distress.      Intensity THRR unchanged THRR unchanged        Progression   Progression Continue to progress workloads to maintain intensity without signs/symptoms of physical distress. Continue to progress workloads to maintain intensity without signs/symptoms of physical distress.      Average METs 2.2 2        Resistance Training   Training Prescription Yes Yes      Weight 3 lb 3 lb      Reps 10-15 10-15        Interval Training   Interval Training No No        NuStep   Level 3 5      SPM -- 80      Minutes 15 15      METs 2.6 2        T5 Nustep   Level 2 --      Minutes 30 --      METs 2 --        Biostep-RELP   Level 2 2      Minutes 15 15       METs 2 2        Home Exercise Plan   Plans to continue exercise at Home (comment)  bike and weights Home (comment)  bike and weights  Frequency Add 2 additional days to program exercise sessions. Add 2 additional days to program exercise sessions.      Initial Home Exercises Provided 06/17/20 06/17/20             Exercise Comments:  Exercise Comments    Row Name 05/06/20 1152           Exercise Comments First full day of exercise!  Patient was oriented to gym and equipment including functions, settings, policies, and procedures.  Patient's individual exercise prescription and treatment plan were reviewed.  All starting workloads were established based on the results of the 6 minute walk test done at initial orientation visit.  The plan for exercise progression was also introduced and progression will be customized based on patient's performance and goals.              Exercise Goals and Review:  Exercise Goals    Row Name 04/21/20 1617             Exercise Goals   Increase Physical Activity Yes       Intervention Provide advice, education, support and counseling about physical activity/exercise needs.;Develop an individualized exercise prescription for aerobic and resistive training based on initial evaluation findings, risk stratification, comorbidities and participant's personal goals.       Expected Outcomes Short Term: Attend rehab on a regular basis to increase amount of physical activity.;Long Term: Add in home exercise to make exercise part of routine and to increase amount of physical activity.;Long Term: Exercising regularly at least 3-5 days a week.       Increase Strength and Stamina Yes       Intervention Provide advice, education, support and counseling about physical activity/exercise needs.;Develop an individualized exercise prescription for aerobic and resistive training based on initial evaluation findings, risk stratification, comorbidities and  participant's personal goals.       Expected Outcomes Short Term: Increase workloads from initial exercise prescription for resistance, speed, and METs.;Short Term: Perform resistance training exercises routinely during rehab and add in resistance training at home;Long Term: Improve cardiorespiratory fitness, muscular endurance and strength as measured by increased METs and functional capacity (6MWT)       Able to understand and use rate of perceived exertion (RPE) scale Yes       Intervention Provide education and explanation on how to use RPE scale       Expected Outcomes Short Term: Able to use RPE daily in rehab to express subjective intensity level;Long Term:  Able to use RPE to guide intensity level when exercising independently       Able to understand and use Dyspnea scale Yes       Intervention Provide education and explanation on how to use Dyspnea scale       Expected Outcomes Short Term: Able to use Dyspnea scale daily in rehab to express subjective sense of shortness of breath during exertion;Long Term: Able to use Dyspnea scale to guide intensity level when exercising independently       Knowledge and understanding of Target Heart Rate Range (THRR) Yes       Intervention Provide education and explanation of THRR including how the numbers were predicted and where they are located for reference       Expected Outcomes Short Term: Able to state/look up THRR;Short Term: Able to use daily as guideline for intensity in rehab;Long Term: Able to use THRR to govern intensity when exercising independently       Able to  check pulse independently Yes       Intervention Provide education and demonstration on how to check pulse in carotid and radial arteries.;Review the importance of being able to check your own pulse for safety during independent exercise       Expected Outcomes Short Term: Able to explain why pulse checking is important during independent exercise;Long Term: Able to check pulse  independently and accurately       Understanding of Exercise Prescription Yes       Intervention Provide education, explanation, and written materials on patient's individual exercise prescription       Expected Outcomes Short Term: Able to explain program exercise prescription;Long Term: Able to explain home exercise prescription to exercise independently              Exercise Goals Re-Evaluation :  Exercise Goals Re-Evaluation    Row Name 05/06/20 1154 05/21/20 1024 05/29/20 1143 06/03/20 0918 06/17/20 1131     Exercise Goal Re-Evaluation   Exercise Goals Review Able to understand and use rate of perceived exertion (RPE) scale;Able to understand and use Dyspnea scale;Knowledge and understanding of Target Heart Rate Range (THRR);Understanding of Exercise Prescription Increase Physical Activity;Increase Strength and Stamina;Understanding of Exercise Prescription Increase Physical Activity Increase Physical Activity;Understanding of Exercise Prescription;Increase Strength and Stamina Increase Physical Activity;Understanding of Exercise Prescription;Increase Strength and Stamina;Able to check pulse independently;Knowledge and understanding of Target Heart Rate Range (THRR);Able to understand and use Dyspnea scale;Able to understand and use rate of perceived exertion (RPE) scale   Comments Reviewed RPE and dyspnea scales, THR and program prescription with pt today.  Pt voiced understanding and was given a copy of goals to take home. Indiya is off to a good start in rehab.  She has completed her first two full days of exercise. We will continue to monitor her progress. When Krissia is not coming to rehab she is using her pedal bike that sits on the floor. She also uses it for her arms when she puts it on the kitchen counter. She exercises for about 20 minutes at a time. Ayanah is doing well in rehab. She has tried a variety of different machines. She is up to 15 watts on the [employer]ike. Will  continue to monitor her progress. Reviewed home exercise with pt today.  Pt plans to use equipment at home for exercise.  Reviewed THR, pulse, RPE, sign and symptoms, pulse oximetery and when to call 911 or MD.  Also discussed weather considerations and indoor options.  Pt voiced understanding.   Expected Outcomes Short: Use RPE daily to regulate intensity. Long: Follow program prescription in THR. Short: Return to regular attendance Long; Continue to follow program prescription Short: continue to increase workout activity at home. Long: maintain home exercises independently Short: Continue to increase workloads Long: Continue to increase strength/stamina Short: monitor HR and O2 when exercising at home Long: maintain exercise after completing [place] Name 06/19/20 0723 07/03/20 1150 07/17/20 0728 07/17/20 1106 07/30/20 1225     Exercise Goal Re-Evaluation   Exercise Goals Review Increase Physical Activity;Increase Strength and Stamina;Understanding of Exercise Prescription Increase Physical Activity;Increase Strength and Stamina Increase Physical Activity;Increase Strength and Stamina;Understanding of Exercise Prescription Increase Physical Activity;Increase Strength and Stamina;Understanding of Exercise Prescription Increase Physical Activity;Increase Strength and Stamina;Understanding of Exercise Prescription   Comments Adriene is doing well in rehab.  She is still on level 1, but she is now up to 2 METs.  We will continue to encourage her to increase  workloads and continue to monitor her progress. Daesia has moved up to level 2 and 3 lb for strength work.  Staff will monitor progress. Adie is doing well in rehab.  She is now on level 3 for the NuStep.  We will continue to montior her progress. Al is doing well.  She is feeling stronger and has more stamina.  She has been using a simple stepper from her daughter for both her arms and legs.  She also does her weights as well.  She tries to get in  some exercise everyday except Sunday. Lavoris attends consistently and works at [employer].  Oxygen levels stay in 90s most of the time.  Staff will monitor progress.   Expected Outcomes Short: Increase to level 2  Long; COnitnue to improve stamina. Short: continue to increase workloads when able Long: increase overall stamina Short: Continue to increase workloads.  Long: Continue to improve stamina. Short: Continue to exercise on off days Long: Continue to improve stamina. Short: contineu to exercise consistently Long: improve average MET level          Discharge Exercise Prescription (Final Exercise Prescription Changes):  Exercise Prescription Changes - 07/30/20 1200      Response to Exercise   Blood Pressure (Admit) 102/60    Blood Pressure (Exercise) 134/58    Blood Pressure (Exit) 108/60    Heart Rate (Admit) 82 bpm    Heart Rate (Exercise) 102 bpm    Heart Rate (Exit) 89 bpm    Oxygen Saturation (Admit) 99 %    Oxygen Saturation (Exercise) 93 %    Oxygen Saturation (Exit) 99 %    Rating of Perceived Exertion (Exercise) 13    Perceived Dyspnea (Exercise) 0    Symptoms none    Duration Continue with 30 min of aerobic exercise without signs/symptoms of physical distress.    Intensity THRR unchanged      Progression   Progression Continue to progress workloads to maintain intensity without signs/symptoms of physical distress.    Average METs 2      Resistance Training   Training Prescription Yes    Weight 3 lb    Reps 10-15      Interval Training   Interval Training No      NuStep   Level 5    SPM 80    Minutes 15    METs 2      Biostep-RELP   Level 2    Minutes 15    METs 2      Home Exercise Plan   Plans to continue exercise at Home (comment)   bike and weights   Frequency Add 2 additional days to program exercise sessions.    Initial Home Exercises Provided 06/17/20           Nutrition:  Target Goals: Understanding of nutrition guidelines, daily intake of  sodium <1533m, cholesterol <2078m calories 30% from fat and 7% or less from saturated fats, daily to have 5 or more servings of fruits and vegetables.  Education: All About Nutrition: -Group instruction provided by verbal, written material, interactive activities, discussions, models, and posters to present general guidelines for heart healthy nutrition including fat, fiber, MyPlate, the role of sodium in heart healthy nutrition, utilization of the nutrition label, and utilization of this knowledge for meal planning. Follow up email sent as well. Written material given at graduation.   [facility]  Date [facility]/17/20  [facility]  Instruction Review Code 1- Verbalizes Understanding      Biometrics:  Pre Biometrics - 04/21/20 1621      Pre Biometrics   Height 5' 2.75" (1.594 m)    Weight 146 lb 11.2 oz (66.5 kg)   self reported   BMI (Calculated) 26.19            Nutrition Therapy Plan and Nutrition Goals:  Nutrition Therapy & Goals - 06/17/20 1025      Nutrition Therapy   Diet Heart healthy, low Na, pulmonary MNT.    Protein (specify units) 80g    Fiber 25 grams    Whole Grain Foods 3 servings    Saturated Fats 12 max. grams    Fruits and Vegetables 5 servings/day    Sodium 1.5 grams      Personal Nutrition Goals   Nutrition Goal ST: Add vegetable servings LT: 6/7 breathing now, would like to improve SOB and lower O2 - 3L now. Would like to maintain weight.    Comments Kennadee likes to eat seafood, pork and chicken. She normally doesn't cook, they go out to eat. [employer], [employer], olive garden, [place] roots, cracker barrel, scrambled [place]).  2 meals per day- goes out for both meals. 11-12pm brunch. 5-6 dinner. Doesn't eat much eggs or dairy B/L: grits, sausage or turley sausage or biscuits or [place] sandwich or soup or sometimes salad. D: fish, oysters, shrimp, hamburger steak, pork chops, chicken [place] - usually  baked. Paired with rice, baked potato, sweet potato, brussels sprouts, cabbage, greens, salad, coleslaw. Drinks: water, unsweetened iced tea with lemon, diet pepsi or coke with lemon and coffee. Discussed heart healthy eating, pulmonary MNT. Matasha does not want to eat at home, but her daughter [name] would like to move to doing that.      Intervention Plan   Intervention Prescribe, educate and counsel regarding individualized specific dietary modifications aiming towards targeted core components such as weight, hypertension, lipid management, diabetes, heart failure and other comorbidities.;Nutrition handout(s) given to patient.    Expected Outcomes Short Term Goal: Understand basic principles of dietary content, such as calories, fat, sodium, cholesterol and nutrients.;Short Term Goal: A plan has been developed with personal nutrition goals set during dietitian appointment.;Long Term Goal: Adherence to prescribed nutrition plan.           Nutrition Assessments:  Nutrition Assessments - 04/21/20 1633      MEDFICTS Scores   Pre Score 18          MEDIFICTS Score Key:  ?70 Need to make dietary changes   40-70 Heart Healthy Diet  ? 40 Therapeutic Level Cholesterol Diet   Picture Your Plate Scores:  <16 Unhealthy dietary pattern with much room for improvement.  41-50 Dietary pattern unlikely to meet recommendations for good health and room for improvement.  51-60 More healthful dietary pattern, with some room for improvement.   >60 Healthy dietary pattern, although there may be some specific behaviors that could be improved.   Nutrition Goals Re-Evaluation:  Nutrition Goals Re-Evaluation    [place] Name 07/17/20 1112             Goals   Nutrition Goal Add in vegetables and maintain weight       Comment Alayzha is doing well with her diet.  She has tried to add in vegetables but not sure how well she is really doing with it.  Her daughter doesn't like to cook and she does not  want to cook any more.  They eat out a lot and is limited in her selection. We talked about ordering salads.  Her favorite is cole slaw and brussell sprouts.  We talked about trying the steamables and saving half for another day.       Expected Outcome Short: Try frozen brussel sprouts and ordering salads  Long; Continue to add in more vegetables.              Nutrition Goals Discharge (Final Nutrition Goals Re-Evaluation):  Nutrition Goals Re-Evaluation - 07/17/20 1112      Goals   Nutrition Goal Add in vegetables and maintain weight    Comment Terressa is doing well with her diet.  She has tried to add in vegetables but not sure how well she is really doing with it.  Her daughter doesn't like to cook and she does not want to cook any more.  They eat out a lot and is limited in her selection. We talked about ordering salads.  Her favorite is cole slaw and brussell sprouts.  We talked about trying the steamables and saving half for another day.    Expected Outcome Short: Try frozen brussel sprouts and ordering salads  Long; Continue to add in more vegetables.           Psychosocial: Target Goals: Acknowledge presence or absence of significant depression and/or stress, maximize coping skills, provide positive support system. Participant is able to verbalize types and ability to use techniques and skills needed for reducing stress and depression.   Education: Stress, Anxiety, and Depression - Group verbal and visual presentation to define topics covered.  Reviews how body is impacted by stress, anxiety, and depression.  Also discusses healthy ways to reduce stress and to treat/manage anxiety and depression.  Written material given at graduation.   Pulmonary Rehab from 07/29/2020 in [facility] Cardiac and Pulmonary Rehab  Date 06/12/20  Educator [facility]  Instruction Review Code 1- Verbalizes Understanding      Education: Sleep Hygiene -Provides group verbal and written instruction about how sleep  can affect your health.  Define sleep hygiene, discuss sleep cycles and impact of sleep habits. Review good sleep hygiene tips.    Initial Review & Psychosocial Screening:  Initial Psych Review & Screening - 02/28/20 1121      Initial Review   Current issues with None Identified      Family Dynamics   Good Support System? Yes   Daughter, church family     Barriers   Psychosocial barriers to participate in program There are no identifiable barriers or psychosocial needs.      Screening Interventions   Interventions Encouraged to exercise    Expected Outcomes Short Term goal: Utilizing psychosocial counselor, staff and physician to assist with identification of specific Stressors or current issues interfering with healing process. Setting desired goal for each stressor or current issue identified.;Long Term Goal: Stressors or current issues are controlled or eliminated.;Short Term goal: Identification and review with participant of any Quality of Life or Depression concerns found by scoring the questionnaire.;Long Term goal: The participant improves quality of Life and PHQ9 Scores as seen by post scores and/or verbalization of changes           Quality of Life Scores:  Scores of 19 and below usually indicate a poorer quality of life in these areas.  A difference of  2-3 points is a clinically meaningful difference.  A difference of 2-3 points in the total score of the Quality of Life Index  has been associated with significant improvement in overall quality of life, self-image, physical symptoms, and general health in studies assessing change in quality of life.  PHQ-9: Recent Review Flowsheet Data    Depression screen [facility] 2/9 04/21/2020 10/04/2019 10/04/2019 06/14/2019 03/29/2018   Decreased Interest 0 0 0 0 0   Down, Depressed, Hopeless 0 0 0 0 0   PHQ - 2 Score 0 0 0 0 0   Altered sleeping 0 0 - - -   Tired, decreased energy 0 0 - - -   Change in appetite 0 0 - - -   Feeling bad or  failure about yourself  0 0 - - -   Trouble concentrating 0 0 - - -   Moving slowly or fidgety/restless 0 0 - - -   Suicidal thoughts 0 0 - - -   PHQ-9 Score 0 0 - - -   Difficult doing work/chores Not difficult at all Not difficult at all - - -     Interpretation of Total Score  Total Score Depression Severity:  1-4 = Minimal depression, 5-9 = Mild depression, 10-14 = Moderate depression, 15-19 = Moderately severe depression, 20-27 = Severe depression   Psychosocial Evaluation and Intervention:  Psychosocial Evaluation - 02/28/20 1133      Psychosocial Evaluation & Interventions   Interventions Encouraged to exercise with the program and follow exercise prescription    Comments Benzenaehibits no barriers to attending the program. She is ready to get started.  She has noticed that she is more shoort of breath than her usual. Could walk a hall before getting SOB, not it happens sooner. She lives in a home with her daughter [name]. She recently moved to [place] to live with her daughter. She wears oxygen at night and during the day when up and about. She voiced no concerns about the program, except what machines will she use and she does not feel she will do well on the treadmill. Jerilyn should do well during her time here.    Expected Outcomes STG: Attends all scheduled sessions. LTG: Improvement in breathing during daily activities.    Continue Psychosocial Services  Follow up required by staff           Psychosocial Re-Evaluation:  Psychosocial Re-Evaluation    [place] Name 05/29/20 1145 06/17/20 1135 07/17/20 1109         Psychosocial Re-Evaluation   Current issues with None Identified -- Current Stress Concerns     Comments Patient reports no issues with their current mental states, sleep, stress, depression or anxiety. Will follow up with patient in a few weeks for any changes. No chaneg or concerns with sleep, stress, etc. Marwa is doing well in rehab. She denies any major stressors  other than not always able to do what she wants because of her breathing.  She sleeps well, no problems.     Expected Outcomes Short: Continue to exercise regularly to support mental health and notify staff of any changes. Long: maintain mental health and well being through teaching of rehab or prescribed medications independently. Short: Continue to exercise regularly to support mental health and notify staff of any changes. Long: maintain mental health and well being through teaching of rehab or prescribed medications independently. short: Continue to attend rehab to build breathing and stamina Long: Continue to stay positive     Interventions Encouraged to attend Pulmonary Rehabilitation for the exercise -- Encouraged to attend Pulmonary Rehabilitation for the exercise  Continue Psychosocial Services  Follow up required by staff -- --            Psychosocial Discharge (Final Psychosocial Re-Evaluation):  Psychosocial Re-Evaluation - 07/17/20 1109      Psychosocial Re-Evaluation   Current issues with Current Stress Concerns    Comments Yailen is doing well in rehab. She denies any major stressors other than not always able to do what she wants because of her breathing.  She sleeps well, no problems.    Expected Outcomes short: Continue to attend rehab to build breathing and stamina Long: Continue to stay positive    Interventions Encouraged to attend Pulmonary Rehabilitation for the exercise           Education: Education Goals: Education classes will be provided on a weekly basis, covering required topics. Participant will state understanding/return demonstration of topics presented.  Learning Barriers/Preferences:  Learning Barriers/Preferences - 02/28/20 1125      Learning Barriers/Preferences   Learning Barriers None    Learning Preferences Audio;Written Material           General Pulmonary Education Topics:  Infection Prevention: - Provides verbal and written  material to individual with discussion of infection control including proper hand washing and proper equipment cleaning during exercise session.   Pulmonary Rehab from 07/29/2020 in [facility] Cardiac and Pulmonary Rehab  Date 04/21/20  Educator AS  Instruction Review Code 1- Verbalizes Understanding      Falls Prevention: - Provides verbal and written material to individual with discussion of falls prevention and safety.   Pulmonary Rehab from 07/29/2020 in [facility] Cardiac and Pulmonary Rehab  Date 04/21/20  Educator AS  Instruction Review Code 1- Verbalizes Understanding      Chronic Lung Disease Review: - Group verbal instruction with posters, models, PowerPoint presentations and videos,  to review new updates, new respiratory medications, new advancements in procedures and treatments. Providing information on websites and "800" numbers for continued self-education. Includes information about supplement oxygen, available portable oxygen systems, continuous and intermittent flow rates, oxygen safety, concentrators, and Medicare reimbursement for oxygen. Explanation of Pulmonary Drugs, including class, frequency, complications, importance of spacers, rinsing mouth after steroid MDI's, and proper cleaning methods for nebulizers. Review of basic lung anatomy and physiology related to function, structure, and complications of lung disease. Review of risk factors. Discussion about methods for diagnosing sleep apnea and types of masks and machines for OSA. Includes a review of the use of types of environmental controls: home humidity, furnaces, filters, dust mite/pet prevention, HEPA vacuums. Discussion about weather changes, air quality and the benefits of nasal washing. Instruction on Warning signs, infection symptoms, calling MD promptly, preventive modes, and value of vaccinations. Review of effective airway clearance, coughing and/or vibration techniques. Emphasizing that all should Create an Action Plan.  Written material given at graduation.   Pulmonary Rehab from 07/29/2020 in [facility] Cardiac and Pulmonary Rehab  Date 06/05/20  Educator [facility]  Instruction Review Code 1- Verbalizes Understanding      AED/CPR: - Group verbal and written instruction with the use of models to demonstrate the basic use of the AED with the basic ABC's of resuscitation.    Anatomy and Cardiac Procedures: - Group verbal and visual presentation and models provide information about basic cardiac anatomy and function. Reviews the testing methods done to diagnose heart disease and the outcomes of the test results. Describes the treatment choices: Medical Management, Angioplasty, or Coronary Bypass Surgery for treating various heart conditions including Myocardial Infarction, Angina, Valve Disease,  and Cardiac Arrhythmias.  Written material given at graduation.   Pulmonary Rehab from 07/29/2020 in [facility] Cardiac and Pulmonary Rehab  Date 07/03/20  Educator SB  Instruction Review Code 1- Verbalizes Understanding      Medication Safety: - Group verbal and visual instruction to review commonly prescribed medications for heart and lung disease. Reviews the medication, class of the drug, and side effects. Includes the steps to properly store meds and maintain the prescription regimen.  Written material given at graduation.   Pulmonary Rehab from 07/29/2020 in [facility] Cardiac and Pulmonary Rehab  Date 07/24/20  Educator [facility]  Instruction Review Code 1- Verbalizes Understanding      Other: -Provides group and verbal instruction on various topics (see comments)   Knowledge Questionnaire Score:  Knowledge Questionnaire Score - 04/21/20 1630      Knowledge Questionnaire Score   Pre Score 9/17            Core Components/Risk Factors/Patient Goals at Admission:  Personal Goals and Risk Factors at Admission - 04/21/20 1623      Core Components/Risk Factors/Patient Goals on Admission    Weight Management Yes     Intervention Weight Management: Develop a combined nutrition and exercise program designed to reach desired caloric intake, while maintaining appropriate intake of nutrient and fiber, sodium and fats, and appropriate energy expenditure required for the weight goal.;Weight Management: Provide education and appropriate resources to help participant work on and attain dietary goals.    Admit Weight 146 lb 11.2 oz (66.5 kg)    Goal Weight: Short Term 146 lb 11.2 oz (66.5 kg)    Goal Weight: Long Term 146 lb 11.2 oz (66.5 kg)    Expected Outcomes Short Term: Continue to assess and modify interventions until short term weight is achieved;Long Term: Adherence to nutrition and physical activity/exercise program aimed toward attainment of established weight goal;Weight Maintenance: Understanding of the daily nutrition guidelines, which includes 25-35% calories from fat, 7% or less cal from saturated fats, less than 221m cholesterol, less than 1.5gm of sodium, & 5 or more servings of fruits and vegetables daily           Education:Diabetes - Individual verbal and written instruction to review signs/symptoms of diabetes, desired ranges of glucose level fasting, after meals and with exercise. Acknowledge that pre and post exercise glucose checks will be done for 3 sessions at entry of program.   Know Your Numbers and Heart Failure: - Group verbal and visual instruction to discuss disease risk factors for cardiac and pulmonary disease and treatment options.  Reviews associated critical values for Overweight/Obesity, Hypertension, Cholesterol, and Diabetes.  Discusses basics of heart failure: signs/symptoms and treatments.  Introduces Heart Failure Zone chart for action plan for heart failure.  Written material given at graduation.   Core Components/Risk Factors/Patient Goals Review:   Goals and Risk Factor Review    Row Name 05/29/20 1141 06/17/20 1133 07/17/20 1111         Core Components/Risk  Factors/Patient Goals Review   Personal Goals Review Weight Management/Obesity;Improve shortness of breath with ADL's Weight Management/Obesity;Improve shortness of breath with ADL's Weight Management/Obesity;Improve shortness of breath with ADL's     Review Spoke to patient about their shortness of breath and what they can do to improve. Patient has been informed of breathing techniques when starting the program. Patient is informed to tell staff if they have had any med changes and that certain meds they are taking or not taking can be causing shortness  of breath. Isra is taking meds as directed.  She feels her shortness of breath has improved.  She can walk further without getting out of breath.  She uses PLB when she remembers. Mikinzie is doing well in rehab. Her weight is pretty steady between 145-150 lb.  She does not like it when it is up to 150 lb.  Her breathing is still limiting her ability to do things but it has improved since starting rehab.  She continues to use the PLB at home when she remembers.     Expected Outcomes Short: Attend [name] regularly to improve shortness of breath with ADL's. Long: maintain independence with ADL's Short:  continue to exercise Long: continue to improve SOB with ADLs Short: Continue to maintain weight  Long; Continue to improve SOB            Core Components/Risk Factors/Patient Goals at Discharge (Final Review):   Goals and Risk Factor Review - 07/17/20 1111      Core Components/Risk Factors/Patient Goals Review   Personal Goals Review Weight Management/Obesity;Improve shortness of breath with ADL's    Review Peyton is doing well in rehab. Her weight is pretty steady between 145-150 lb.  She does not like it when it is up to 150 lb.  Her breathing is still limiting her ability to do things but it has improved since starting rehab.  She continues to use the PLB at home when she remembers.    Expected Outcomes Short: Continue to maintain weight  Long;  Continue to improve SOB           ITP Comments:  ITP Comments    Row Name 02/28/20 1139 04/21/20 1639 05/06/20 1152 05/14/20 1606 06/11/20 0557   ITP Comments Virtual orientation call completed today. She has an appointment on Date: 03/04/2020 for EP eval and gym Orientation.  Documentation of diagnosis can be found in  Date: [facility] 02/18/2020. Completed 6MWT and gym orientation. Initial ITP created and sent for review to Dr. Emily Filbert, Medical Director. First full day of exercise!  Patient was oriented to gym and equipment including functions, settings, policies, and procedures.  Patient's individual exercise prescription and treatment plan were reviewed.  All starting workloads were established based on the results of the 6 minute walk test done at initial orientation visit.  The plan for exercise progression was also introduced and progression will be customized based on patient's performance and goals. 30 day review completed. ITP sent to Dr. Emily Filbert, Medical Director of Cardiac and Pulmonary Rehab. Continue with ITP unless changes are made by physician. 30 Day review completed. Medical Director ITP review done, changes made as directed, and signed approval by Medical Director.   [place] Name 07/09/20 0656 08/06/20 1020         ITP Comments 30 Day review completed. Medical Director ITP review done, changes made as directed, and signed approval by Medical Director. 30 Day review completed. Medical Director ITP review done, changes made as directed, and signed approval by Medical Director.             Comments:

## 2020-08-07 ENCOUNTER — Other Ambulatory Visit: Payer: Self-pay

## 2020-08-07 ENCOUNTER — Encounter: Payer: Medicare Other | Attending: Pulmonary Disease

## 2020-08-07 DIAGNOSIS — J439 Emphysema, unspecified: Secondary | ICD-10-CM | POA: Diagnosis not present

## 2020-08-07 NOTE — Progress Notes (Signed)
Daily Session Note  Patient Details  Name: Brianna Adams MRN: [id number] Date of Birth: 10-27-1940 Referring Provider:     Pulmonary Rehab from 04/21/2020 in [facility] Cardiac and Pulmonary Rehab  Referring Provider [name]      Encounter Date: 08/07/2020  Check In:  Session Check In - 08/07/20 1152      Check-In   Supervising physician immediately available to respond to emergencies See telemetry face sheet for immediately available ER MD    Location [facility]-Cardiac & Pulmonary Rehab    Staff Present Birdie Sons, MPA, RN;Melissa Caiola RDN, LDN;Joseph Alcus Dad, RN BSN    Virtual Visit No    Medication changes reported     No    Fall or balance concerns reported    No    Warm-up and Cool-down Performed on first and last piece of equipment    Resistance Training Performed Yes    VAD Patient? No    PAD/SET Patient? No      Pain Assessment   Currently in Pain? No/denies              Social History   Tobacco Use  Smoking Status Former Smoker  . Packs/day: 1.00  . Years: 58.00  . Pack years: 58.00  . Types: Cigarettes  . Quit date: 10/22/2015  . Years since quitting: 4.7  Smokeless Tobacco Never Used    Goals Met:  Independence with exercise equipment Exercise tolerated well No report of cardiac concerns or symptoms Strength training completed today  Goals Unmet:  Not Applicable  Comments: Pt able to follow exercise prescription today without complaint.  Will continue to monitor for progression.    Dr. Emily Filbert is Medical Director for [place] and [name].

## 2020-08-12 ENCOUNTER — Other Ambulatory Visit: Payer: Self-pay

## 2020-08-12 ENCOUNTER — Encounter: Payer: Medicare Other | Admitting: *Deleted

## 2020-08-12 DIAGNOSIS — J439 Emphysema, unspecified: Secondary | ICD-10-CM

## 2020-08-12 NOTE — Progress Notes (Signed)
Daily Session Note  Patient Details  Name: Brianna Adams MRN: [id number] Date of Birth: [birth year] Referring Provider:     Pulmonary Rehab from 04/21/2020 in [facility] Cardiac and Pulmonary Rehab  Referring Provider [name]      Encounter Date: 08/12/2020  Check In:  Session Check In - 08/12/20 1139      Check-In   Supervising physician immediately available to respond to emergencies See telemetry face sheet for immediately available ER MD    Location [facility]-Cardiac & Pulmonary Rehab    Staff Present Heath Lark, RN, BSN, Jacklynn Bue, MS Exercise Physiologist;Amanda Oletta Darter, BA, ACSM CEP, Exercise Physiologist;Jessica [place], MA, RCEP, CCRP, CCET    Virtual Visit No    Medication changes reported     No    Fall or balance concerns reported    No    Warm-up and Cool-down Performed on first and last piece of equipment    Resistance Training Performed Yes    VAD Patient? No    PAD/SET Patient? No      Pain Assessment   Currently in Pain? No/denies              Social History   Tobacco Use  Smoking Status Former Smoker  . Packs/day: 1.00  . Years: 58.00  . Pack years: 58.00  . Types: Cigarettes  . Quit date: 10/22/2015  . Years since quitting: 4.8  Smokeless Tobacco Never Used    Goals Met:  Proper associated with RPD/PD & O2 Sat Independence with exercise equipment Exercise tolerated well No report of cardiac concerns or symptoms  Goals Unmet:  Not Applicable  Comments: Pt able to follow exercise prescription today without complaint.  Will continue to monitor for progression.    Dr. Emily Filbert is Medical Director for [place] and [name].

## 2020-08-13 ENCOUNTER — Ambulatory Visit (INDEPENDENT_AMBULATORY_CARE_PROVIDER_SITE_OTHER): Payer: Medicare Other | Admitting: Pulmonary Disease

## 2020-08-13 ENCOUNTER — Encounter: Payer: Self-pay | Admitting: Pulmonary Disease

## 2020-08-13 VITALS — BP 126/70 | HR 86 | Temp 97.8°F | Ht 62.75 in | Wt 148.0 lb

## 2020-08-13 DIAGNOSIS — Z86711 Personal history of pulmonary embolism: Secondary | ICD-10-CM | POA: Diagnosis not present

## 2020-08-13 DIAGNOSIS — I272 Pulmonary hypertension, unspecified: Secondary | ICD-10-CM

## 2020-08-13 DIAGNOSIS — J9611 Chronic respiratory failure with hypoxia: Secondary | ICD-10-CM

## 2020-08-13 DIAGNOSIS — Z01811 Encounter for preprocedural respiratory examination: Secondary | ICD-10-CM

## 2020-08-13 DIAGNOSIS — Z85118 Personal history of other malignant neoplasm of bronchus and lung: Secondary | ICD-10-CM

## 2020-08-13 DIAGNOSIS — J439 Emphysema, unspecified: Secondary | ICD-10-CM | POA: Diagnosis not present

## 2020-08-13 DIAGNOSIS — M67432 Ganglion, left wrist: Secondary | ICD-10-CM

## 2020-08-13 MED ORDER — ANORO ELLIPTA 62.5-25 MCG/INH IN AEPB
1.0000 | INHALATION_SPRAY | Freq: Every day | RESPIRATORY_TRACT | 5 refills | Status: DC
Start: 2020-08-13 — End: 2021-01-08

## 2020-08-13 MED ORDER — ANORO ELLIPTA 62.5-25 MCG/INH IN AEPB: 1.0000 | INHALATION_SPRAY | Freq: Every day | RESPIRATORY_TRACT | 0 refills | Status: AC

## 2020-08-13 NOTE — Progress Notes (Signed)
Subjective:    Patient ID: Brianna Adams, female    DOB: [birth year], 79 y.o.   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 Patient followed this with radiation therapy to a left upper lobe that was found on surveillance CT post resection. The patient had pneumonia and PE noted on admission on 25 June to [facility]. She has been on Eliquis for her PE. Given her prior history of malignancy, relatively sedentary status, and other comorbidities she will require anticoagulation long-term. The patient also has significant issues with COPD and is on Anoro Ellipta.  She had pulmonary function testing performed on15 July 2021, as previous, lung volumes were invalid.  She has persistently decreased DLCO at 49%, unchanged from prior no overt obstruction however there is significant bronchodilator response.  Testing limited by patient's inability to perform the test well.  She has been participating in pulmonary rehab and has noted marked improvement in her symptoms with this therapy.  She has been markedly improved with regards to her dyspnea.  She continues to have some baseline dyspnea but feels this is manageable.  She has been compliant with oxygen therapy.  Her current liter flow of oxygen is 3 L/min continuous. She notes improvement with Anoro daily. She has underlying emphysema by CT scan.  Her only concern today is that she would like to be cleared for excision of a ganglion cyst on her left wrist. Previously we had to delay this due to the fact that she had had a recent PE and needed at least 3 to 4 months therapy on anticoagulation due to her significant decompensation from PE.  Review of Systems A 10 point review of systems was performed and it is as noted above otherwise negative.  Patient Active  Problem List   Diagnosis Date Noted  . Lobar pneumonia ([place])   . Acute diastolic CHF (congestive heart failure) ([place])   . Acute on chronic respiratory failure with hypoxia ([place])   . Lymphadenopathy   . Pulmonary embolism ([place]) 02/29/2020  . Chronic respiratory failure with hypoxia ([place]) 02/18/2020  . Pulmonary nodule 02/18/2020  . GERD (gastroesophageal reflux disease) 02/18/2020  . Pulmonary hypertension, unspecified ([place]) 10/04/2019  . Chronic obstructive pulmonary disease ([place]) 03/14/2019  . Type 2 diabetes mellitus without complication, without long-term current use of insulin ([place]) 03/14/2019   Allergies  Allergen Reactions  . Penicillin G Shortness Of Breath    Other reaction(s): Rash  . Ace Inhibitors Swelling  . Atorvastatin Other (See Comments)    Other reaction(s): Myalgias (Muscle Pain)   . Cortisone Other (See Comments)    Other reaction(s): Patient not sure  . Hydrocortisone Other (See Comments)    unknown  . Lisinopril Other (See Comments)    Other reaction(s): Angioedema  . Meloxicam Other (See Comments)    Bloody stools  . Penicillins Hives    60 years ago  . Pravastatin Other (See Comments)     Chest Pain   Current Meds  Medication Sig  . acetaminophen (TYLENOL) 500 MG tablet Take 1,000 mg by mouth every 6 (six) hours as needed for mild pain or fever.   Marland Kitchen albuterol (VENTOLIN HFA) 108 (90 Base) MCG/ACT inhaler Inhale 2 puffs into the lungs every 6 (six) hours as needed for wheezing or shortness of breath.  Marland Kitchen  apixaban (ELIQUIS) 5 MG TABS tablet Take 1 tablet (5 mg total) by mouth 2 (two) times daily.  . calcium carbonate (OSCAL) 1500 (600 Ca) MG TABS tablet Take 1,200 mg by mouth daily.  . Cinnamon 500 MG TABS Take 1,000 mg by mouth daily.   . furosemide (LASIX) 20 MG tablet TAKE 1 TABLET BY MOUTH EVERY DAY (Patient taking differently: Take 20 mg by mouth daily. )  . metFORMIN (GLUCOPHAGE) 1000 MG tablet TAKE 1 TABLET (1,000 MG TOTAL) BY MOUTH 2 (TWO) TIMES  DAILY WITH A MEAL.  . Multiple Vitamins-Minerals (CENTRUM SILVER PO) Take 1 tablet by mouth daily.   Marland Kitchen omeprazole (PRILOSEC) 40 MG capsule Take 1 capsule (40 mg total) by mouth daily.  . pravastatin (PRAVACHOL) 10 MG tablet TAKE 1 TABLET BY MOUTH EVERY DAY (Patient taking differently: Take 10 mg by mouth daily. )  . umeclidinium-vilanterol (ANORO ELLIPTA) 62.5-25 MCG/INH AEPB Inhale 1 puff into the lungs daily.   . vitamin C (ASCORBIC ACID) 500 MG tablet Take 500 mg by mouth daily.    Immunization History  Administered Date(s) Administered  . Fluad Quad(high Dose 65+) 05/24/2019, 05/13/2020  . Influenza-Unspecified 06/06/2018  . PFIZER SARS-COV-2 Vaccination 10/20/2019, 11/19/2019  . Pneumococcal Conjugate-13 10/31/2013  . Pneumococcal Polysaccharide-23 11/05/2007  . Td 09/07/2003  . Tdap 08/17/2017  . Zoster 11/05/2010  . Zoster Recombinat (Shingrix) 08/17/2017, 06/28/2019, 09/05/2019       Objective:   Physical Exam BP 126/70 (BP Location: Left Arm, Cuff Size: Normal)   Pulse 86   Temp 97.8 F (36.6 C) (Temporal)   Ht 5' 2.75" (1.594 m)   Wt 148 lb (67.1 kg)   SpO2 92%   BMI 26.43 kg/m  GENERAL: Well-developed well-nourished elderly woman no acute distress, no tachypnea, she presents in transport chair. HEAD: Normocephalic, atraumatic.  EYES: Pupils equal, round, reactive to light. No scleral icterus.  MOUTH: Nose/mouth/throat not examined due to masking requirements for COVID 19. NECK: Supple. No thyromegaly. Trachea midline. No JVD. No adenopathy. PULMONARY: Lungs clear to auscultation bilaterally. CARDIOVASCULAR: S1 and S2. Regular rate and rhythm. There is a grade 2/6 systolic ejection murmur left sternal border. GASTROINTESTINAL: Benign. MUSCULOSKELETAL: No joint deformity, no clubbing, no edema.  Ganglion cyst on left wrist. NEUROLOGIC: No focal deficit. Speech is fluent.  SKIN: Intact,warm,dry. Limited exam no rashes PSYCH: Mood and behavior appropriate.      Assessment & Plan:     ICD-10-CM   1. Pulmonary emphysema, unspecified emphysema type ([place])  J43.9    Continue Anoro Ellipta  2. Chronic respiratory failure with hypoxia (HCC)  J96.11    Continue oxygen supplementation at 3 L/min Patient benefiting from oxygen supplementation  3. Pulmonary hypertension (H[facility])  I27.20    Management will be with supplemental O2  4. History of pulmonary embolism  Z86.711    On Eliquis She will need indefinite therapy due to comorbidities  5. Ganglion cyst of volar aspect of left wrist  M67.432    Will need excision   6. History of lung cancer  Z85.118    No evidence of recurrence on most recent CT  7. Encounter for preoperative pulmonary examination  Z01.811    Moderate risk for proposed procedure of ganglion cyst excision May DC Eliquis 3 days prior to procedure and resume when safe from a postoperative standpoint   Meds ordered this encounter  Medications  . umeclidinium-vilanterol (ANORO ELLIPTA) 62.5-25 MCG/INH AEPB    Sig: Inhale 1 puff into the lungs daily for 1  day.    Dispense:  7 each    Refill:  0    Order Specific Question:   Lot Number?    Answer:   [id number]    Order Specific Question:   Expiration Date?    Answer:   07/07/2021    Order Specific Question:   Manufacturer?    Answer:   [employer] [[id number]]    Order Specific Question:   Quantity    Answer:   2  . umeclidinium-vilanterol (ANORO ELLIPTA) 62.5-25 MCG/INH AEPB    Sig: Inhale 1 puff into the lungs daily.    Dispense:  60 each    Refill:  5   Discussion:  Patient appears to be doing well.  Continue therapy as is.  She is a moderate risk for the proposed procedure of ganglion cyst removal.  This risk cannot be further reduced.  She is optimized as she can be.  She may discontinue Eliquis for 3 days prior to the procedure and then resume as soon as from the postoperative standpoint.  We will see the patient in follow-up in 3 months time she is to contact us prior to that time  should any new difficulties arise.  She is to continue with pulmonary rehabilitation.  Renold Don, MD [facility]   *This note was dictated using voice recognition software/Dragon.  Despite best efforts to proofread, errors can occur which can change the meaning.  Any change was purely unintentional.

## 2020-08-13 NOTE — Patient Instructions (Signed)
You are doing well.  Continue using your oxygen at nighttime.   I have sent a note to Dr. Fredna Dow so that you can have your ganglion cyst removed.   We will see you in follow-up in 3 months time.

## 2020-08-14 ENCOUNTER — Other Ambulatory Visit: Payer: Self-pay

## 2020-08-14 DIAGNOSIS — J439 Emphysema, unspecified: Secondary | ICD-10-CM | POA: Diagnosis not present

## 2020-08-14 NOTE — Progress Notes (Signed)
Daily Session Note  Patient Details  Name: Brianna Adams MRN: [id number] Date of Birth: [birth year] Referring Provider:   Flowsheet Row Pulmonary Rehab from 04/21/2020 in [facility] Cardiac and Pulmonary Rehab  Referring Provider [name]      Encounter Date: 08/14/2020  Check In:  Session Check In - 08/14/20 1033      Check-In   Supervising physician immediately available to respond to emergencies See telemetry face sheet for immediately available ER MD    Location [facility]-Cardiac & Pulmonary Rehab    Staff Present Birdie Sons, MPA, RN;Melissa Caiola RDN, Rowe Pavy, BA, ACSM CEP, Exercise Physiologist    Virtual Visit No    Medication changes reported     No    Fall or balance concerns reported    No    Warm-up and Cool-down Performed on first and last piece of equipment    Resistance Training Performed Yes    VAD Patient? No    PAD/SET Patient? No      Pain Assessment   Currently in Pain? No/denies              Social History   Tobacco Use  Smoking Status Former Smoker  . Packs/day: 1.00  . Years: 58.00  . Pack years: 58.00  . Types: Cigarettes  . Quit date: 10/22/2015  . Years since quitting: 4.8  Smokeless Tobacco Never Used    Goals Met:  Independence with exercise equipment Exercise tolerated well Personal goals reviewed No report of cardiac concerns or symptoms Strength training completed today  Goals Unmet:  Not Applicable  Comments: Pt able to follow exercise prescription today without complaint.  Will continue to monitor for progression.    Dr. Emily Filbert is Medical Director for [place] and [name].

## 2020-08-19 ENCOUNTER — Encounter: Payer: Medicare Other | Admitting: *Deleted

## 2020-08-19 ENCOUNTER — Other Ambulatory Visit: Payer: Self-pay

## 2020-08-19 DIAGNOSIS — J439 Emphysema, unspecified: Secondary | ICD-10-CM | POA: Diagnosis not present

## 2020-08-19 NOTE — Progress Notes (Signed)
Daily Session Note  Patient Details  Name: Aalijah Mims MRN: [id number] Date of Birth: [birth year] Referring Provider:   Flowsheet Row Pulmonary Rehab from 04/21/2020 in [facility] Cardiac and Pulmonary Rehab  Referring Provider [name]      Encounter Date: 08/19/2020  Check In:  Session Check In - 08/19/20 1119      Check-In   Supervising physician immediately available to respond to emergencies See telemetry face sheet for immediately available ER MD    Location [facility]-Cardiac & Pulmonary Rehab    Staff Present Heath Lark, RN, BSN, CCRP;Amanda Sommer, BA, ACSM CEP, Exercise Physiologist;Joseph Hood RCP,RRT,BSRT;Melissa [place] RDN, LDN    Virtual Visit No    Medication changes reported     No    Fall or balance concerns reported    No    Warm-up and Cool-down Performed on first and last piece of equipment    Resistance Training Performed Yes    VAD Patient? No    PAD/SET Patient? No      Pain Assessment   Currently in Pain? No/denies              Social History   Tobacco Use  Smoking Status Former Smoker  . Packs/day: 1.00  . Years: 58.00  . Pack years: 58.00  . Types: Cigarettes  . Quit date: 10/22/2015  . Years since quitting: 4.8  Smokeless Tobacco Never Used    Goals Met:  Proper associated with RPD/PD & O2 Sat Independence with exercise equipment Exercise tolerated well No report of cardiac concerns or symptoms  Goals Unmet:  Not Applicable  Comments: Pt able to follow exercise prescription today without complaint.  Will continue to monitor for progression.    Dr. Emily Filbert is Medical Director for [place] and [name].

## 2020-08-20 ENCOUNTER — Other Ambulatory Visit: Payer: Self-pay | Admitting: Orthopedic Surgery

## 2020-08-21 ENCOUNTER — Other Ambulatory Visit: Payer: Self-pay

## 2020-08-21 ENCOUNTER — Encounter: Payer: Medicare Other | Admitting: *Deleted

## 2020-08-21 DIAGNOSIS — J439 Emphysema, unspecified: Secondary | ICD-10-CM

## 2020-08-21 NOTE — Progress Notes (Signed)
Daily Session Note  Patient Details  Name: Brianna Adams MRN: [id number] Date of Birth: 01-Apr-1941 Referring Provider:   Flowsheet Row Pulmonary Rehab from 04/21/2020 in [facility] Cardiac and Pulmonary Rehab  Referring Provider [name]      Encounter Date: 08/21/2020  Check In:  Session Check In - 08/21/20 1116      Check-In   Supervising physician immediately available to respond to emergencies See telemetry face sheet for immediately available ER MD    Location [facility]-Cardiac & Pulmonary Rehab    Staff Present Heath Lark, RN, BSN, CCRP;Jessica [place], MA, RCEP, CCRP, CCET;Melissa [place] RDN, LDN;Joseph [employer] No    Medication changes reported     No    Fall or balance concerns reported    No    Warm-up and Cool-down Performed on first and last piece of equipment    Resistance Training Performed Yes    VAD Patient? No    PAD/SET Patient? No      Pain Assessment   Currently in Pain? No/denies              Social History   Tobacco Use  Smoking Status Former Smoker  . Packs/day: 1.00  . Years: 58.00  . Pack years: 58.00  . Types: Cigarettes  . Quit date: 10/22/2015  . Years since quitting: 4.8  Smokeless Tobacco Never Used    Goals Met:  Proper associated with RPD/PD & O2 Sat Independence with exercise equipment Exercise tolerated well No report of cardiac concerns or symptoms  Goals Unmet:  Not Applicable  Comments: Pt able to follow exercise prescription today without complaint.  Will continue to monitor for progression.    Dr. Emily Filbert is Medical Director for [place] and [name].

## 2020-08-28 ENCOUNTER — Other Ambulatory Visit: Payer: Self-pay

## 2020-08-28 DIAGNOSIS — J439 Emphysema, unspecified: Secondary | ICD-10-CM

## 2020-08-28 NOTE — Progress Notes (Signed)
Daily Session Note  Patient Details  Name: Yanelli Zapanta MRN: [id number] Date of Birth: September 23, 1940 Referring Provider:   Flowsheet Row Pulmonary Rehab from 04/21/2020 in [facility] Cardiac and Pulmonary Rehab  Referring Provider [name]      Encounter Date: 08/28/2020  Check In:  Session Check In - 08/28/20 1115      Check-In   Supervising physician immediately available to respond to emergencies See telemetry face sheet for immediately available ER MD    Location [facility]-Cardiac & Pulmonary Rehab    Staff Present Birdie Sons, MPA, RN;Melissa Caiola RDN, LDN;Joseph Tessie Fass RCP,RRT,BSRT    Virtual Visit No    Medication changes reported     No    Fall or balance concerns reported    No    Warm-up and Cool-down Performed on first and last piece of equipment    Resistance Training Performed Yes    VAD Patient? No    PAD/SET Patient? No      Pain Assessment   Currently in Pain? No/denies              Social History   Tobacco Use  Smoking Status Former Smoker  . Packs/day: 1.00  . Years: 58.00  . Pack years: 58.00  . Types: Cigarettes  . Quit date: 10/22/2015  . Years since quitting: 4.8  Smokeless Tobacco Never Used    Goals Met:  Independence with exercise equipment Exercise tolerated well No report of cardiac concerns or symptoms Strength training completed today  Goals Unmet:  Not Applicable  Comments: Pt able to follow exercise prescription today without complaint.  Will continue to monitor for progression.    Dr. Emily Filbert is Medical Director for [place] and [name].

## 2020-09-02 ENCOUNTER — Other Ambulatory Visit: Payer: Self-pay

## 2020-09-02 ENCOUNTER — Encounter: Payer: Medicare Other | Admitting: *Deleted

## 2020-09-02 DIAGNOSIS — J439 Emphysema, unspecified: Secondary | ICD-10-CM

## 2020-09-02 NOTE — Progress Notes (Signed)
Daily Session Note ° °Patient Details  °Name: Brianna Adams °MRN: [id number] °Date of Birth: 07/30/1941 °Referring Provider:   °Flowsheet Row Pulmonary Rehab from 04/21/2020 in [facility] Cardiac and Pulmonary Rehab  °Referring Provider [place]  °  ° ° °Encounter Date: 09/02/2020 ° °Check In: ° Session Check In - 09/02/20 1138   °  ° Check-In  ° Supervising physician immediately available to respond to emergencies See telemetry face sheet for immediately available ER MD   ° Location [facility]-Cardiac [facility] Pulmonary Rehab   ° Staff Present Susanne Bice, RN, BSN, CCRP;Melissa Caiola RDN, LDN;Amanda Sommer, BA, ACSM CEP, Exercise Physiologist;Joseph Hood RCP,RRT,BSRT   ° Virtual Visit No   ° Medication changes reported     No   ° Fall or balance concerns reported    No   ° Warm-up and Cool-down Performed on first and last piece of equipment   ° Resistance Training Performed Yes   ° VAD Patient? No   ° PAD/SET Patient? No   °  ° Pain Assessment  ° Currently in Pain? No/denies   °  °  °  ° ° ° ° ° °Social History  ° °Tobacco Use  °Smoking Status Former Smoker  °• Packs/day: 1.00  °• Years: 58.00  °• Pack years: 58.00  °• Types: Cigarettes  °• Quit date: 10/22/2015  °• Years since quitting: 4.8  °Smokeless Tobacco Never Used  ° ° °Goals Met:  °Proper associated with RPD/PD & O2 Sat °Independence with exercise equipment °Exercise tolerated well °No report of cardiac concerns or symptoms ° °Goals Unmet:  °Not Applicable ° °Comments: Pt able to follow exercise prescription today without complaint.  Will continue to monitor for progression. ° ° ° °Dr. Mark Miller is Medical Director for [facility] and [name]. °

## 2020-09-03 ENCOUNTER — Encounter: Payer: Self-pay | Admitting: *Deleted

## 2020-09-03 ENCOUNTER — Encounter: Payer: Self-pay | Admitting: Nurse Practitioner

## 2020-09-03 DIAGNOSIS — J439 Emphysema, unspecified: Secondary | ICD-10-CM

## 2020-09-03 NOTE — Progress Notes (Signed)
Pulmonary Individual Treatment Plan  Patient Details  Name: Brianna Adams MRN: [id number] Date of Birth: 1940-10-27 Referring Provider:   Flowsheet Row Pulmonary Rehab from 04/21/2020 in [facility] Cardiac and Pulmonary Rehab  Referring Provider [name]      Initial Encounter Date:  Flowsheet Row Pulmonary Rehab from 04/21/2020 in [facility] Cardiac and Pulmonary Rehab  Date 04/21/20      Visit Diagnosis: Pulmonary emphysema, unspecified emphysema type ([place])  Patient's Home Medications on Admission:  Current Outpatient Medications:  .  acetaminophen (TYLENOL) 500 MG tablet, Take 1,000 mg by mouth every 6 (six) hours as needed for mild pain or fever. , Disp: , Rfl:  .  albuterol (VENTOLIN HFA) 108 (90 Base) MCG/ACT inhaler, Inhale 2 puffs into the lungs every 6 (six) hours as needed for wheezing or shortness of breath., Disp: 18 g, Rfl: 0 .  apixaban (ELIQUIS) 5 MG TABS tablet, Take 1 tablet (5 mg total) by mouth 2 (two) times daily., Disp: 90 tablet, Rfl: 1 .  calcium carbonate (OSCAL) 1500 (600 Ca) MG TABS tablet, Take 1,200 mg by mouth daily., Disp: , Rfl:  .  Cinnamon 500 MG TABS, Take 1,000 mg by mouth daily. , Disp: , Rfl:  .  furosemide (LASIX) 20 MG tablet, TAKE 1 TABLET BY MOUTH EVERY DAY (Patient taking differently: Take 20 mg by mouth daily. ), Disp: 90 tablet, Rfl: 2 .  metFORMIN (GLUCOPHAGE) 1000 MG tablet, TAKE 1 TABLET (1,000 MG TOTAL) BY MOUTH 2 (TWO) TIMES DAILY WITH A MEAL., Disp: 180 tablet, Rfl: 0 .  Multiple Vitamins-Minerals (CENTRUM SILVER PO), Take 1 tablet by mouth daily. , Disp: , Rfl:  .  omeprazole (PRILOSEC) 40 MG capsule, Take 1 capsule (40 mg total) by mouth daily., Disp: 90 capsule, Rfl: 1 .  pravastatin (PRAVACHOL) 10 MG tablet, TAKE 1 TABLET BY MOUTH EVERY DAY (Patient taking differently: Take 10 mg by mouth daily. ), Disp: 90 tablet, Rfl: 1 .  umeclidinium-vilanterol (ANORO ELLIPTA) 62.5-25 MCG/INH AEPB, Inhale 1 puff into the lungs daily. , Disp: , Rfl:  .   umeclidinium-vilanterol (ANORO ELLIPTA) 62.5-25 MCG/INH AEPB, Inhale 1 puff into the lungs daily., Disp: 60 each, Rfl: 5 .  vitamin C (ASCORBIC ACID) 500 MG tablet, Take 500 mg by mouth daily. , Disp: , Rfl:   Past Medical History: Past Medical History:  Diagnosis Date  . CHF (congestive heart failure) ([place])   . COPD (chronic obstructive pulmonary disease) ([place])   . Diabetes ([place])   . Diverticulitis   . High cholesterol   . Lung cancer ([place])   . Pulmonary hypertension ([place])   . Uterine cancer ([place])     Tobacco Use: Social History   Tobacco Use  Smoking Status Former Smoker  . Packs/day: 1.00  . Years: 58.00  . Pack years: 58.00  . Types: Cigarettes  . Quit date: 10/22/2015  . Years since quitting: 4.8  Smokeless Tobacco Never Used    Labs: Recent Review Flowsheet Data    Labs for ITP Cardiac and Pulmonary Rehab Latest Ref Rng & Units 06/14/2019 10/04/2019 01/07/2020 02/29/2020 05/13/2020   Cholestrol 100 - 199 mg/dL - 152 149 - 168   LDLCALC 0 - 99 mg/dL - 80 73 - 94   HDL >39 mg/dL - 57 61 - 61   Trlycerides 0 - 149 mg/dL - 76 80 - 67   Hemoglobin A1c 4.8 - 5.6 % 6.3(H) 6.2(H) 5.8(H) - 6.0(H)   HCO3 20.0 - 28.0 mmol/L - - - 19.0(L) -  ACIDBASEDEF 0.0 - 2.0 mmol/L - - - 4.0(H) -   O2SAT % - - - 99.6 -       Pulmonary Assessment Scores:  Pulmonary Assessment Scores    Row Name 04/21/20 1628         ADL UCSD   SOB Score total 68     Rest 0     Walk 3     Stairs 4     Bath 2     Dress 1     Shop 3           CAT Score   CAT Score 22           mMRC Score   mMRC Score 2            UCSD: Self-administered rating of dyspnea associated with activities of daily living (ADLs) 6-point scale (0 = "not at all" to 5 = "maximal or unable to do because of breathlessness")  Scoring Scores range from 0 to 120.  Minimally important difference is 5 units  CAT: CAT can identify the health impairment of COPD patients and is better correlated with disease progression.   CAT has a scoring range of zero to 40. The CAT score is classified into four groups of low (less than 10), medium (10 - 20), high (21-30) and very high (31-40) based on the impact level of disease on health status. A CAT score over 10 suggests significant symptoms.  A worsening CAT score could be explained by an exacerbation, poor medication adherence, poor inhaler technique, or progression of COPD or comorbid conditions.  CAT MCID is 2 points  mMRC: mMRC (Modified Medical Research Council) Dyspnea Scale is used to assess the degree of baseline functional disability in patients of respiratory disease due to dyspnea. No minimal important difference is established. A decrease in score of 1 point or greater is considered a positive change.   Pulmonary Function Assessment:   Exercise Target Goals: Exercise Program Goal: Individual exercise prescription set using results from initial 6 min walk test and THRR while considering  patient's activity barriers and safety.   Exercise Prescription Goal: Initial exercise prescription builds to 30-45 minutes a day of aerobic activity, 2-3 days per week.  Home exercise guidelines will be given to patient during program as part of exercise prescription that the participant will acknowledge.  Education: Aerobic Exercise: - Group verbal and visual presentation on the components of exercise prescription. Introduces F.I.T.T principle from ACSM for exercise prescriptions.  Reviews F.I.T.T. principles of aerobic exercise including progression. Written material given at graduation. Flowsheet Row Cardiac Rehab from 08/28/2020 in [facility] Cardiac and Pulmonary Rehab  Date 07/03/20  Hilda Blades only on 10/28]  Educator [facility]  Instruction Review Code 1- [employer] Understanding      Education: Resistance Exercise: - Group verbal and visual presentation on the components of exercise prescription. Introduces F.I.T.T principle from ACSM for exercise prescriptions  Reviews  F.I.T.T. principles of resistance exercise including progression. Written material given at graduation. Flowsheet Row Cardiac Rehab from 08/28/2020 in [facility] Cardiac and Pulmonary Rehab  Date 08/28/20  Educator [facility]  Instruction Review Code 1- [employer] Understanding       Education: Exercise & Equipment Safety: - Individual verbal instruction and demonstration of equipment use and safety with use of the equipment. Flowsheet Row Cardiac Rehab from 08/28/2020 in [facility] Cardiac and Pulmonary Rehab  Date 04/21/20  Educator AS  Instruction Review Code 1- Verbalizes Understanding      Education: Exercise  Physiology & General Exercise Guidelines: - Group verbal and written instruction with models to review the exercise physiology of the cardiovascular system and associated critical values. Provides general exercise guidelines with specific guidelines to those with heart or lung disease.  Flowsheet Row Cardiac Rehab from 08/28/2020 in [facility] Cardiac and Pulmonary Rehab  Date 08/21/20  Educator [facility]  Instruction Review Code 1- Verbalizes Understanding      Education: Flexibility, Balance, Mind/Body Relaxation: - Group verbal and visual presentation with interactive activity on the components of exercise prescription. Introduces F.I.T.T principle from ACSM for exercise prescriptions. Reviews F.I.T.T. principles of flexibility and balance exercise training including progression. Also discusses the mind body connection.  Reviews various relaxation techniques to help reduce and manage stress (i.e. Deep breathing, progressive muscle relaxation, and visualization). Balance handout provided to take home. Written material given at graduation. Flowsheet Row Cardiac Rehab from 08/28/2020 in [facility] Cardiac and Pulmonary Rehab  Date 07/10/20  Educator AS  Instruction Review Code 1- Verbalizes Understanding      Activity Barriers & Risk Stratification:   6 Minute Walk:  6 Minute Walk    Row Name 04/21/20  1609         6 Minute Walk   Phase Initial     Distance 380 feet     Walk Time 4.5 minutes     # of Rest Breaks 3     MPH 0.95     METS 0.8     RPE 11     Perceived Dyspnea  2     VO2 Peak 2.75     Symptoms No     Resting HR 85 bpm     Resting BP 120/60     Resting Oxygen Saturation  98 %     Exercise Oxygen Saturation  during 6 min walk 90 %     Max Ex. HR 103 bpm     Max Ex. BP 134/66     2 Minute Post BP 130/62           Interval HR   1 Minute HR 102     2 Minute HR 93     3 Minute HR 100     4 Minute HR 100     5 Minute HR 101     6 Minute HR 103     2 Minute Post HR 82     Interval Heart Rate? Yes           Interval Oxygen   Interval Oxygen? Yes     Baseline Oxygen Saturation % 98 %     1 Minute Oxygen Saturation % 92 %     1 Minute Liters of Oxygen 3 L     2 Minute Oxygen Saturation % 90 %     2 Minute Liters of Oxygen 3 L     3 Minute Oxygen Saturation % 94 %     3 Minute Liters of Oxygen 3 L     4 Minute Oxygen Saturation % 90 %     4 Minute Liters of Oxygen 3 L     5 Minute Oxygen Saturation % 93 %     5 Minute Liters of Oxygen 3 L     6 Minute Oxygen Saturation % 93 %     6 Minute Liters of Oxygen 3 L     2 Minute Post Oxygen Saturation % 98 %     2 Minute Post Liters of Oxygen 3 L  Oxygen Initial Assessment:  Oxygen Initial Assessment - 05/06/20 1152      Home Oxygen   Home Oxygen Device Portable Concentrator;Home Concentrator    Sleep Oxygen Prescription Continuous    Liters per minute 2    Home Exercise Oxygen Prescription None    Home Resting Oxygen Prescription Continuous    Liters per minute -1    Compliance with Home Oxygen Use Yes      Intervention   Short Term Goals To learn and exhibit compliance with exercise, home and travel O2 prescription;To learn and understand importance of monitoring SPO2 with pulse oximeter and demonstrate accurate use of the pulse oximeter.;To learn and understand importance of maintaining  oxygen saturations>88%;To learn and demonstrate proper pursed lip breathing techniques or other breathing techniques.;To learn and demonstrate proper use of respiratory medications    Long  Term Goals Verbalizes importance of monitoring SPO2 with pulse oximeter and return demonstration;Exhibits proper breathing techniques, such as pursed lip breathing or other method taught during program session;Maintenance of O2 saturations>88%;Exhibits compliance with exercise, home and travel O2 prescription;Demonstrates proper use of MDI's;Compliance with respiratory medication           Oxygen Re-Evaluation:  Oxygen Re-Evaluation    [place] Name 05/06/20 1153 05/29/20 1137 06/17/20 1136 07/17/20 1116 08/14/20 1116     Program Oxygen Prescription   Program Oxygen Prescription -- Continuous;E-Tanks Continuous;E-Tanks Continuous;E-Tanks Continuous   Liters per minute -- 3 -- 3 3     Home Oxygen   Home Oxygen Device -- Home Concentrator;Portable Concentrator;E-Tanks Home Concentrator;Portable Concentrator;E-Tanks Home Concentrator;Portable Concentrator;E-Tanks Home Concentrator;E-Tanks   Sleep Oxygen Prescription -- Continuous Continuous Continuous Continuous   Liters per minute -- _0 Home Exercise Oxygen Prescription -- Continuous Continuous Continuous Continuous   Liters per minute -- _1 Home Resting Oxygen Prescription -- Continuous Continuous Continuous Continuous   Liters per minute -- _2 Compliance with Home Oxygen Use -- Yes Yes Yes Yes     Goals/Expected Outcomes   Short Term Goals To learn and demonstrate proper pursed lip breathing techniques or other breathing techniques. To learn and exhibit compliance with exercise, home and travel O2 prescription;To learn and understand importance of monitoring SPO2 with pulse oximeter and demonstrate accurate use of the pulse oximeter.;To learn and understand importance of maintaining oxygen saturations>88%;To learn and demonstrate  proper use of respiratory medications;To learn and demonstrate proper pursed lip breathing techniques or other breathing techniques. To learn and exhibit compliance with exercise, home and travel O2 prescription;To learn and understand importance of monitoring SPO2 with pulse oximeter and demonstrate accurate use of the pulse oximeter.;To learn and understand importance of maintaining oxygen saturations>88%;To learn and demonstrate proper use of respiratory medications;To learn and demonstrate proper pursed lip breathing techniques or other breathing techniques. To learn and exhibit compliance with exercise, home and travel O2 prescription;To learn and understand importance of monitoring SPO2 with pulse oximeter and demonstrate accurate use of the pulse oximeter.;To learn and understand importance of maintaining oxygen saturations>88%;To learn and demonstrate proper use of respiratory medications;To learn and demonstrate proper pursed lip breathing techniques or other breathing techniques. To learn and understand importance of maintaining oxygen saturations>88%;To learn and exhibit compliance with exercise, home and travel O2 prescription   Long  Term Goals Exhibits proper breathing techniques, such as pursed lip breathing or other method taught during program session Exhibits compliance with exercise, home and travel O2 prescription;Exhibits proper breathing techniques, such  as pursed lip breathing or other method taught during program session;Verbalizes importance of monitoring SPO2 with pulse oximeter and return demonstration;Demonstrates proper use of MDI's;Maintenance of O2 saturations>88%;Compliance with respiratory medication Exhibits compliance with exercise, home and travel O2 prescription;Exhibits proper breathing techniques, such as pursed lip breathing or other method taught during program session;Verbalizes importance of monitoring SPO2 with pulse oximeter and return demonstration;Demonstrates  proper use of MDI's;Maintenance of O2 saturations>88%;Compliance with respiratory medication Exhibits compliance with exercise, home and travel O2 prescription;Exhibits proper breathing techniques, such as pursed lip breathing or other method taught during program session;Verbalizes importance of monitoring SPO2 with pulse oximeter and return demonstration;Demonstrates proper use of MDI's;Maintenance of O2 saturations>88%;Compliance with respiratory medication Maintenance of O2 saturations>88%;Exhibits compliance with exercise, home and travel O2 prescription   Comments Reviewed PLB technique with pt.  Talked about how it works and it's importance in maintaining their exercise saturations. Informed patient how to perform the Pursed Lipped breathing technique. Told patient to Inhale through the nose and out the mouth with pursed lips to keep their airways open, help oxygenate them better, practice when at rest or doing strenuous activity. Patient Verbalizes understanding of technique and will work on and be reiterated during [place]. Pt uses PLB when she remembers.  Sh eis feeling like SOB has improved since starting LW program. Latosha is doing well with her oxygen therapy.  She is using her PLB when she remembers.  She has noted that her breathing has been improving since starting the program.  She continues to do well on her respiratory meds too. [place] states that her oxygen tank is heavy and she doesn not bring it to rehab due to it being heavy. She was informed to use it when in the wating room due to her oxygen being low before exercise. Informed her again that is it improtant to monitor her oxygen and use it when she is out of the house. Informed her to check her oxygen when she if off oxygen running errends and to make sure she is 88 percent and above.   Goals/Expected Outcomes Short: Become more profiecient at using PLB.   Long: Become independent at using PLB. Short: use PLB with exertion. Long: use  PLB on exertion proficiently and independently Short: continue to be compliant with O2 and PLB Long: use PLB as needed Short: continue to be compliant with O2 and PLB Long: use PLB as needed Short: use oxygen when she runs errends. Long: Maintain using oxygen when in stores and running errends.          Oxygen Discharge (Final Oxygen Re-Evaluation):  Oxygen Re-Evaluation - 08/14/20 1116      Program Oxygen Prescription   Program Oxygen Prescription Continuous    Liters per minute 3      Home Oxygen   Home Oxygen Device Home Concentrator;E-Tanks    Sleep Oxygen Prescription Continuous    Liters per minute 3    Home Exercise Oxygen Prescription Continuous    Liters per minute 3    Home Resting Oxygen Prescription Continuous    Liters per minute 3    Compliance with Home Oxygen Use Yes      Goals/Expected Outcomes   Short Term Goals To learn and understand importance of maintaining oxygen saturations>88%;To learn and exhibit compliance with exercise, home and travel O2 prescription    Long  Term Goals Maintenance of O2 saturations>88%;Exhibits compliance with exercise, home and travel O2 prescription    Comments [place] states that her oxygen tank  is heavy and she doesn not bring it to rehab due to it being heavy. She was informed to use it when in the wating room due to her oxygen being low before exercise. Informed her again that is it improtant to monitor her oxygen and use it when she is out of the house. Informed her to check her oxygen when she if off oxygen running errends and to make sure she is 88 percent and above.    Goals/Expected Outcomes Short: use oxygen when she runs errends. Long: Maintain using oxygen when in stores and running errends.           Initial Exercise Prescription:  Initial Exercise Prescription - 04/21/20 1600      Date of Initial Exercise RX and Referring Provider   Date 04/21/20    Referring Provider [name]      Treadmill   MPH 0.8    Grade 0     Minutes 15    METs 1      Recumbant Bike   Level 1    RPM 60    Watts 5    Minutes 15    METs 1      NuStep   Level 1    SPM 80    Minutes 15    METs 1      Arm Ergometer   Level 1    RPM 25    Minutes 15    METs 1      Recumbant Elliptical   Level 1    RPM 30    Minutes 15    METs 1      Prescription Details   Frequency (times per week) 3    Duration Progress to 30 minutes of continuous aerobic without signs/symptoms of physical distress      Intensity   THRR 40-80% of Max Heartrate 107-129    Ratings of Perceived Exertion 11-13    Perceived Dyspnea 0-4      Resistance Training   Training Prescription Yes    Weight 2 lb    Reps 10-15           Perform Capillary Blood Glucose checks as needed.  Exercise Prescription Changes:  Exercise Prescription Changes    Row Name 04/21/20 1600 05/06/20 1500 05/21/20 1000 06/03/20 0900 07/03/20 1100     Response to Exercise   Blood Pressure (Admit) 120/60 124/64 122/60 142/74 112/60   Blood Pressure (Exercise) 134/66 128/70 146/64 146/70 126/64   Blood Pressure (Exit) 130/62 122/62 138/70 100/58 128/64   Heart Rate (Admit) 85 bpm 97 bpm 84 bpm 73 bpm 86 bpm   Heart Rate (Exercise) 103 bpm 100 bpm 114 bpm 108 bpm 109 bpm   Heart Rate (Exit) 82 bpm 90 bpm 95 bpm 82 bpm 87 bpm   Oxygen Saturation (Admit) 98 % 89 % 99 % 100 % 93 %   Oxygen Saturation (Exercise) 90 % 96 % 92 % 92 % 92 %   Oxygen Saturation (Exit) 98 % 99 % 98 % 95 % 94 %   Rating of Perceived Exertion (Exercise) _0 Perceived Dyspnea (Exercise) _1 Symptoms _2    Comments -- first day -- -- --   Duration -- Progress to 30 minutes of  aerobic without signs/symptoms of physical distress Progress to 30 minutes of  aerobic without signs/symptoms of physical distress Progress to 30 minutes of  aerobic without  signs/symptoms of physical distress Progress to 30 minutes of  aerobic without signs/symptoms of physical  distress   Intensity -- -- THRR unchanged THRR unchanged THRR unchanged     Progression   Progression -- Continue to progress workloads to maintain intensity without signs/symptoms of physical distress. Continue to progress workloads to maintain intensity without signs/symptoms of physical distress. Continue to progress workloads to maintain intensity without signs/symptoms of physical distress. Continue to progress workloads to maintain intensity without signs/symptoms of physical distress.   Average METs -- 2 2.19 2.32 2.2     Resistance Training   Training Prescription -- Yes Yes Yes Yes   Weight -- 2 lb 2 lb 2 lb 3 lb   Reps -- 10-15 10-15 10-15 10-15     Interval Training   Interval Training -- No No No No     Recumbant Bike   Level -- _0 --   RPM -- 60 -- -- --   Watts -- _1 --   Minutes -- _2 --   METs -- 1 2.38 -- --     NuStep   Level -- -- -- 1 2   SPM -- -- -- 80 80   Minutes -- -- -- 15 15   METs -- -- -- 2.3 2.3     Arm Ergometer   Level -- _3 --   Minutes -- _4 --   METs -- 2 1.9 -- --     REL-XR   Level -- -- -- 1 --   Minutes -- -- -- 15 --   METs -- -- -- 3.18 --     Biostep-RELP   Level -- -- -- -- 2   Minutes -- -- -- -- 15   METs -- -- -- -- 2   Row Name 07/17/20 0700 07/30/20 1200 08/13/20 1000 08/26/20 1000       Response to Exercise   Blood Pressure (Admit) 112/60 102/60 136/64 128/62    Blood Pressure (Exercise) 134/70 134/58 130/66 140/60    Blood Pressure (Exit) 122/62 108/60 134/70 116/70    Heart Rate (Admit) 78 bpm 82 bpm 88 bpm 75 bpm    Heart Rate (Exercise) 100 bpm 102 bpm 97 bpm 107 bpm    Heart Rate (Exit) 90 bpm 89 bpm 91 bpm 93 bpm    Oxygen Saturation (Admit) 98 % 99 % 88 % 99 %    Oxygen Saturation (Exercise) 91 % 93 % 90 % 88 %    Oxygen Saturation (Exit) 97 % 99 % 99 % 94 %    Rating of Perceived Exertion (Exercise) _5 Perceived Dyspnea (Exercise) 0 0 1 1    Symptoms none none none none     Duration Continue with 30 min of aerobic exercise without signs/symptoms of physical distress. Continue with 30 min of aerobic exercise without signs/symptoms of physical distress. Continue with 30 min of aerobic exercise without signs/symptoms of physical distress. Continue with 30 min of aerobic exercise without signs/symptoms of physical distress.    Intensity THRR unchanged THRR unchanged THRR unchanged THRR unchanged         Progression   Progression Continue to progress workloads to maintain intensity without signs/symptoms of physical distress. Continue to progress workloads to maintain intensity without signs/symptoms of physical distress. Continue to progress workloads to maintain intensity without signs/symptoms of physical distress. Continue to progress workloads to maintain intensity without  signs/symptoms of physical distress.    Average METs 2.2 2 2.25 2         Resistance Training   Training Prescription Yes Yes Yes Yes    Weight 3 lb 3 lb 3 lb 3 lb    Reps 10-15 10-15 10-15 10-15         Interval Training   Interval Training No No No No         NuStep   Level _0 SPM -- 80 -- 80    Minutes _1 METs 2.6 2 2.5 2         T5 Nustep   Level 2 -- -- --    Minutes 30 -- -- --    METs 2 -- -- --         Biostep-RELP   Level _2 SPM -- -- -- 50    Minutes _3 METs _4 Home Exercise Plan   Plans to continue exercise at Home (comment)  bike and weights Home (comment)  bike and weights Home (comment)  bike and weights Home (comment)  bike and weights    Frequency Add 2 additional days to program exercise sessions. Add 2 additional days to program exercise sessions. Add 2 additional days to program exercise sessions. Add 2 additional days to program exercise sessions.    Initial Home Exercises Provided 06/17/20 06/17/20 06/17/20 06/17/20           Exercise Comments:  Exercise Comments    Row Name 05/06/20 1152            Exercise Comments First full day of exercise!  Patient was oriented to gym and equipment including functions, settings, policies, and procedures.  Patient's individual exercise prescription and treatment plan were reviewed.  All starting workloads were established based on the results of the 6 minute walk test done at initial orientation visit.  The plan for exercise progression was also introduced and progression will be customized based on patient's performance and goals.              Exercise Goals and Review:  Exercise Goals    Row Name 04/21/20 1617             Exercise Goals   Increase Physical Activity Yes       Intervention Provide advice, education, support and counseling about physical activity/exercise needs.;Develop an individualized exercise prescription for aerobic and resistive training based on initial evaluation findings, risk stratification, comorbidities and participant's personal goals.       Expected Outcomes Short Term: Attend rehab on a regular basis to increase amount of physical activity.;Long Term: Add in home exercise to make exercise part of routine and to increase amount of physical activity.;Long Term: Exercising regularly at least 3-5 days a week.       Increase Strength and Stamina Yes       Intervention Provide advice, education, support and counseling about physical activity/exercise needs.;Develop an individualized exercise prescription for aerobic and resistive training based on initial evaluation findings, risk stratification, comorbidities and participant's personal goals.       Expected Outcomes Short Term: Increase workloads from initial exercise prescription for resistance, speed, and METs.;Short Term: Perform resistance training exercises routinely during rehab and add in resistance training at home;Long Term: Improve cardiorespiratory fitness, muscular  endurance and strength as measured by increased METs and functional capacity (6MWT)        Able to understand and use rate of perceived exertion (RPE) scale Yes       Intervention Provide education and explanation on how to use RPE scale       Expected Outcomes Short Term: Able to use RPE daily in rehab to express subjective intensity level;Long Term:  Able to use RPE to guide intensity level when exercising independently       Able to understand and use Dyspnea scale Yes       Intervention Provide education and explanation on how to use Dyspnea scale       Expected Outcomes Short Term: Able to use Dyspnea scale daily in rehab to express subjective sense of shortness of breath during exertion;Long Term: Able to use Dyspnea scale to guide intensity level when exercising independently       Knowledge and understanding of Target Heart Rate Range (THRR) Yes       Intervention Provide education and explanation of THRR including how the numbers were predicted and where they are located for reference       Expected Outcomes Short Term: Able to state/look up THRR;Short Term: Able to use daily as guideline for intensity in rehab;Long Term: Able to use THRR to govern intensity when exercising independently       Able to check pulse independently Yes       Intervention Provide education and demonstration on how to check pulse in carotid and radial arteries.;Review the importance of being able to check your own pulse for safety during independent exercise       Expected Outcomes Short Term: Able to explain why pulse checking is important during independent exercise;Long Term: Able to check pulse independently and accurately       Understanding of Exercise Prescription Yes       Intervention Provide education, explanation, and written materials on patient's individual exercise prescription       Expected Outcomes Short Term: Able to explain program exercise prescription;Long Term: Able to explain home exercise prescription to exercise independently              Exercise Goals Re-Evaluation :   Exercise Goals Re-Evaluation    Row Name 05/06/20 1154 05/21/20 1024 05/29/20 1143 06/03/20 0918 06/17/20 1131     Exercise Goal Re-Evaluation   Exercise Goals Review Able to understand and use rate of perceived exertion (RPE) scale;Able to understand and use Dyspnea scale;Knowledge and understanding of Target Heart Rate Range (THRR);Understanding of Exercise Prescription Increase Physical Activity;Increase Strength and Stamina;Understanding of Exercise Prescription Increase Physical Activity Increase Physical Activity;Understanding of Exercise Prescription;Increase Strength and Stamina Increase Physical Activity;Understanding of Exercise Prescription;Increase Strength and Stamina;Able to check pulse independently;Knowledge and understanding of Target Heart Rate Range (THRR);Able to understand and use Dyspnea scale;Able to understand and use rate of perceived exertion (RPE) scale   Comments Reviewed RPE and dyspnea scales, THR and program prescription with pt today.  Pt voiced understanding and was given a copy of goals to take home. Sharay is off to a good start in rehab.  She has completed her first two full days of exercise. We will continue to monitor her progress. When Elverda is not coming to rehab she is using her pedal bike that sits on the floor. She also uses it for her arms when she puts it on the kitchen counter. She exercises for about 20 minutes at a time. Chryl Heck  is doing well in rehab. She has tried a variety of different machines. She is up to 15 watts on the [employer]ike. Will continue to monitor her progress. Reviewed home exercise with pt today.  Pt plans to use equipment at home for exercise.  Reviewed THR, pulse, RPE, sign and symptoms, pulse oximetery and when to call 911 or MD.  Also discussed weather considerations and indoor options.  Pt voiced understanding.   Expected Outcomes Short: Use RPE daily to regulate intensity. Long: Follow program prescription in THR. Short: Return to  regular attendance Long; Continue to follow program prescription Short: continue to increase workout activity at home. Long: maintain home exercises independently Short: Continue to increase workloads Long: Continue to increase strength/stamina Short: monitor HR and O2 when exercising at home Long: maintain exercise after completing [place] Name 06/19/20 0723 07/03/20 1150 07/17/20 0728 07/17/20 1106 07/30/20 1225     Exercise Goal Re-Evaluation   Exercise Goals Review Increase Physical Activity;Increase Strength and Stamina;Understanding of Exercise Prescription Increase Physical Activity;Increase Strength and Stamina Increase Physical Activity;Increase Strength and Stamina;Understanding of Exercise Prescription Increase Physical Activity;Increase Strength and Stamina;Understanding of Exercise Prescription Increase Physical Activity;Increase Strength and Stamina;Understanding of Exercise Prescription   Comments Leathia is doing well in rehab.  She is still on level 1, but she is now up to 2 METs.  We will continue to encourage her to increase workloads and continue to monitor her progress. Meleni has moved up to level 2 and 3 lb for strength work.  Staff will monitor progress. Alaysia is doing well in rehab.  She is now on level 3 for the NuStep.  We will continue to montior her progress. Kylani is doing well.  She is feeling stronger and has more stamina.  She has been using a simple stepper from her daughter for both her arms and legs.  She also does her weights as well.  She tries to get in some exercise everyday except Sunday. Mahaila attends consistently and works at [employer].  Oxygen levels stay in 90s most of the time.  Staff will monitor progress.   Expected Outcomes Short: Increase to level 2  Long; COnitnue to improve stamina. Short: continue to increase workloads when able Long: increase overall stamina Short: Continue to increase workloads.  Long: Continue to improve stamina. Short: Continue to  exercise on off days Long: Continue to improve stamina. Short: contineu to exercise consistently Long: improve average MET level   Row Name 08/13/20 1010 08/26/20 1009           Exercise Goal Re-Evaluation   Exercise Goals Review Increase Physical Activity;Increase Strength and Stamina;Understanding of Exercise Prescription Increase Physical Activity;Increase Strength and Stamina      Comments Charlesa is doing well in rehab.  She is now on level 5 for the NuStep.  We will continue to monitor her progress. Alani continues to tolerate exercise well. She maintain oxygen 88% and above on 3L during exercise.      Expected Outcomes Short: Increase BioStep to level 3 Long; Continue to improve stamina. Short:  increase levels on machines Long: improve overall stamina             Discharge Exercise Prescription (Final Exercise Prescription Changes):  Exercise Prescription Changes - 08/26/20 1000      Response to Exercise   Blood Pressure (Admit) 128/62    Blood Pressure (Exercise) 140/60    Blood Pressure (Exit) 116/70    Heart Rate (Admit) 75 bpm  Heart Rate (Exercise) 107 bpm    Heart Rate (Exit) 93 bpm    Oxygen Saturation (Admit) 99 %    Oxygen Saturation (Exercise) 88 %    Oxygen Saturation (Exit) 94 %    Rating of Perceived Exertion (Exercise) 13    Perceived Dyspnea (Exercise) 1    Symptoms none    Duration Continue with 30 min of aerobic exercise without signs/symptoms of physical distress.    Intensity THRR unchanged      Progression   Progression Continue to progress workloads to maintain intensity without signs/symptoms of physical distress.    Average METs 2      Resistance Training   Training Prescription Yes    Weight 3 lb    Reps 10-15      Interval Training   Interval Training No      NuStep   Level 5    SPM 80    Minutes 15    METs 2      Biostep-RELP   Level 2    SPM 50    Minutes 15    METs 2      Home Exercise Plan   Plans to continue exercise  at Home (comment)   bike and weights   Frequency Add 2 additional days to program exercise sessions.    Initial Home Exercises Provided 06/17/20           Nutrition:  Target Goals: Understanding of nutrition guidelines, daily intake of sodium <1539m, cholesterol <2051m calories 30% from fat and 7% or less from saturated fats, daily to have 5 or more servings of fruits and vegetables.  Education: All About Nutrition: -Group instruction provided by verbal, written material, interactive activities, discussions, models, and posters to present general guidelines for heart healthy nutrition including fat, fiber, MyPlate, the role of sodium in heart healthy nutrition, utilization of the nutrition label, and utilization of this knowledge for meal planning. Follow up email sent as well. Written material given at graduation. [facility]  Date 07/17/20  [facility] Review Code 1- Verbalizes Understanding      Biometrics:  Pre Biometrics - 04/21/20 1621      Pre Biometrics   Height 5' 2.75" (1.594 m)    Weight 146 lb 11.2 oz (66.5 kg)   self reported   BMI (Calculated) 26.19            Nutrition Therapy Plan and Nutrition Goals:  Nutrition Therapy & Goals - 06/17/20 1025      Nutrition Therapy   Diet Heart healthy, low Na, pulmonary MNT.    Protein (specify units) 80g    Fiber 25 grams    Whole Grain Foods 3 servings    Saturated Fats 12 max. grams    Fruits and Vegetables 5 servings/day    Sodium 1.5 grams      Personal Nutrition Goals   Nutrition Goal ST: Add vegetable servings LT: 6/7 breathing now, would like to improve SOB and lower O2 - 3L now. Would like to maintain weight.    Comments Omara likes to eat seafood, pork and chicken. She normally doesn't cook, they go out to eat. [employer], olive garden, so[place]aguayoots, cracker barrel, scrambled ([place]  2 meals per day- goes  out for both meals. 11-12pm brunch. 5-6 dinner. Doesn't eat much eggs or dairy B/L: grits, sausage or turley sausage or biscuits or tu[place]andwich or soup or  sometimes salad. D: fish, oysters, shrimp, hamburger steak, pork chops, chicken [place] - usually baked. Paired with rice, baked potato, sweet potato, brussels sprouts, cabbage, greens, salad, coleslaw. Drinks: water, unsweetened iced tea with lemon, diet pepsi or coke with lemon and coffee. Discussed heart healthy eating, pulmonary MNT. Anisa does not want to eat at home, but her daughter [name] would like to move to doing that.      Intervention Plan   Intervention Prescribe, educate and counsel regarding individualized specific dietary modifications aiming towards targeted core components such as weight, hypertension, lipid management, diabetes, heart failure and other comorbidities.;Nutrition handout(s) given to patient.    Expected Outcomes Short Term Goal: Understand basic principles of dietary content, such as calories, fat, sodium, cholesterol and nutrients.;Short Term Goal: A plan has been developed with personal nutrition goals set during dietitian appointment.;Long Term Goal: Adherence to prescribed nutrition plan.           Nutrition Assessments:  Nutrition Assessments - 04/21/20 1633      MEDFICTS Scores   Pre Score 18          MEDIFICTS Score Key:  ?70 Need to make dietary changes   40-70 Heart Healthy Diet  ? 40 Therapeutic Level Cholesterol Diet   Picture Your Plate Scores:  <78 Unhealthy dietary pattern with much room for improvement.  41-50 Dietary pattern unlikely to meet recommendations for good health and room for improvement.  51-60 More healthful dietary pattern, with some room for improvement.   >60 Healthy dietary pattern, although there may be some specific behaviors that could be improved.   Nutrition Goals Re-Evaluation:  Nutrition Goals Re-Evaluation    [place] Name 07/17/20 1112 08/14/20 1123            Goals   Current Weight -- 146 lb (66.2 kg)      Nutrition Goal Add in vegetables and maintain weight Maintain weight. Make healthier eating choices.      Comment Kelsi is doing well with her diet.  She has tried to add in vegetables but not sure how well she is really doing with it.  Her daughter doesn't like to cook and she does not want to cook any more.  They eat out a lot and is limited in her selection. We talked about ordering salads.  Her favorite is cole slaw and brussell sprouts.  We talked about trying the steamables and saving half for another day. Declynn has no questions about her diet and tries to maintain her weight.      Expected Outcome Short: Try frozen brussel sprouts and ordering salads  Long; Continue to add in more vegetables. Short: Continue to make healthy eating choices. Long: maintain weight independently             Nutrition Goals Discharge (Final Nutrition Goals Re-Evaluation):  Nutrition Goals Re-Evaluation - 08/14/20 1123      Goals   Current Weight 146 lb (66.2 kg)    Nutrition Goal Maintain weight. Make healthier eating choices.    Comment Klynn has no questions about her diet and tries to maintain her weight.    Expected Outcome Short: Continue to make healthy eating choices. Long: maintain weight independently           Psychosocial: Target Goals: Acknowledge presence or absence of significant depression and/or stress, maximize coping skills, provide positive support system. Participant is able to verbalize types and ability to use techniques and skills needed for reducing stress and depression.   Education:  Stress, Anxiety, and Depression - Group verbal and visual presentation to define topics covered.  Reviews how body is impacted by stress, anxiety, and depression.  Also discusses healthy ways to reduce stress and to treat/manage anxiety and depression.  Written material given at graduation. Flowsheet Row Cardiac Rehab from  08/28/2020 in [facility] Cardiac and Pulmonary Rehab  Date 08/14/20  Educator SB  Instruction Review Code 1- [employer] Understanding      Education: Sleep Hygiene -Provides group verbal and written instruction about how sleep can affect your health.  Define sleep hygiene, discuss sleep cycles and impact of sleep habits. Review good sleep hygiene tips.    Initial Review & Psychosocial Screening:   Quality of Life Scores:  Scores of 19 and below usually indicate a poorer quality of life in these areas.  A difference of  2-3 points is a clinically meaningful difference.  A difference of 2-3 points in the total score of the Quality of Life Index has been associated with significant improvement in overall quality of life, self-image, physical symptoms, and general health in studies assessing change in quality of life.  PHQ-9: Recent Review Flowsheet Data    Depression screen [facility] 2/9 04/21/2020 10/04/2019 10/04/2019 06/14/2019 03/29/2018   Decreased Interest 0 0 0 0 0   Down, Depressed, Hopeless 0 0 0 0 0   PHQ - 2 Score 0 0 0 0 0   Altered sleeping 0 0 - - -   Tired, decreased energy 0 0 - - -   Change in appetite 0 0 - - -   Feeling bad or failure about yourself  0 0 - - -   Trouble concentrating 0 0 - - -   Moving slowly or fidgety/restless 0 0 - - -   Suicidal thoughts 0 0 - - -   PHQ-9 Score 0 0 - - -   Difficult doing work/chores Not difficult at all Not difficult at all - - -     Interpretation of Total Score  Total Score Depression Severity:  1-4 = Minimal depression, 5-9 = Mild depression, 10-14 = Moderate depression, 15-19 = Moderately severe depression, 20-27 = Severe depression   Psychosocial Evaluation and Intervention:   Psychosocial Re-Evaluation:  Psychosocial Re-Evaluation    Row Name 05/29/20 1145 06/17/20 1135 07/17/20 1109 08/14/20 1122       Psychosocial Re-Evaluation   Current issues with None Identified -- Current Stress Concerns None Identified    Comments  Patient reports no issues with their current mental states, sleep, stress, depression or anxiety. Will follow up with patient in a few weeks for any changes. No chaneg or concerns with sleep, stress, etc. Lakenya is doing well in rehab. She denies any major stressors other than not always able to do what she wants because of her breathing.  She sleeps well, no problems. Patient reports no issues with their current mental states, sleep, stress, depression or anxiety. Will follow up with patient in a few weeks for any changes.    Expected Outcomes Short: Continue to exercise regularly to support mental health and notify staff of any changes. Long: maintain mental health and well being through teaching of rehab or prescribed medications independently. Short: Continue to exercise regularly to support mental health and notify staff of any changes. Long: maintain mental health and well being through teaching of rehab or prescribed medications independently. short: Continue to attend rehab to build breathing and stamina Long: Continue to stay positive Short: Continue to exercise regularly  to support mental health and notify staff of any changes. Long: maintain mental health and well being through teaching of rehab or prescribed medications independently.    Interventions Encouraged to attend Pulmonary Rehabilitation for the exercise -- Encouraged to attend Pulmonary Rehabilitation for the exercise Encouraged to attend Pulmonary Rehabilitation for the exercise    Continue Psychosocial Services  Follow up required by staff -- -- Follow up required by staff           Psychosocial Discharge (Final Psychosocial Re-Evaluation):  Psychosocial Re-Evaluation - 08/14/20 1122      Psychosocial Re-Evaluation   Current issues with None Identified    Comments Patient reports no issues with their current mental states, sleep, stress, depression or anxiety. Will follow up with patient in a few weeks for any changes.     Expected Outcomes Short: Continue to exercise regularly to support mental health and notify staff of any changes. Long: maintain mental health and well being through teaching of rehab or prescribed medications independently.    Interventions Encouraged to attend Pulmonary Rehabilitation for the exercise    Continue Psychosocial Services  Follow up required by staff           Education: Education Goals: Education classes will be provided on a weekly basis, covering required topics. Participant will state understanding/return demonstration of topics presented.  Learning Barriers/Preferences:   General Pulmonary Education Topics:  Infection Prevention: - Provides verbal and written material to individual with discussion of infection control including proper hand washing and proper equipment cleaning during exercise session. Flowsheet Row Cardiac Rehab from 08/28/2020 in [facility] Cardiac and Pulmonary Rehab  Date 04/21/20  Educator AS  Instruction Review Code 1- Verbalizes Understanding      Falls Prevention: - Provides verbal and written material to individual with discussion of falls prevention and safety. Flowsheet Row Cardiac Rehab from 08/28/2020 in [facility] Cardiac and Pulmonary Rehab  Date 04/21/20  Educator AS  Instruction Review Code 1- Verbalizes Understanding      Chronic Lung Disease Review: - Group verbal instruction with posters, models, PowerPoint presentations and videos,  to review new updates, new respiratory medications, new advancements in procedures and treatments. Providing information on websites and "800" numbers for continued self-education. Includes information about supplement oxygen, available portable oxygen systems, continuous and intermittent flow rates, oxygen safety, concentrators, and Medicare reimbursement for oxygen. Explanation of Pulmonary Drugs, including class, frequency, complications, importance of spacers, rinsing mouth after steroid MDI's, and proper  cleaning methods for nebulizers. Review of basic lung anatomy and physiology related to function, structure, and complications of lung disease. Review of risk factors. Discussion about methods for diagnosing sleep apnea and types of masks and machines for OSA. Includes a review of the use of types of environmental controls: home humidity, furnaces, filters, dust mite/pet prevention, HEPA vacuums. Discussion about weather changes, air quality and the benefits of nasal washing. Instruction on Warning signs, infection symptoms, calling MD promptly, preventive modes, and value of vaccinations. Review of effective airway clearance, coughing and/or vibration techniques. Emphasizing that all should Create an Action Plan. Written material given at graduation. Flowsheet Row Cardiac Rehab from 08/28/2020 in [facility] Cardiac and Pulmonary Rehab  Date 08/07/20  Educator [facility]  Instruction Review Code 1- Verbalizes Understanding      AED/CPR: - Group verbal and written instruction with the use of models to demonstrate the basic use of the AED with the basic ABC's of resuscitation.    Anatomy and Cardiac Procedures: - Group verbal and visual  presentation and models provide information about basic cardiac anatomy and function. Reviews the testing methods done to diagnose heart disease and the outcomes of the test results. Describes the treatment choices: Medical Management, Angioplasty, or Coronary Bypass Surgery for treating various heart conditions including Myocardial Infarction, Angina, Valve Disease, and Cardiac Arrhythmias.  Written material given at graduation. Flowsheet Row Cardiac Rehab from 08/28/2020 in [facility] Cardiac and Pulmonary Rehab  Date 08/28/20  Educator [facility]  Instruction Review Code 1- Verbalizes Understanding      Medication Safety: - Group verbal and visual instruction to review commonly prescribed medications for heart and lung disease. Reviews the medication, class of the drug, and side effects.  Includes the steps to properly store meds and maintain the prescription regimen.  Written material given at graduation. Flowsheet Row Cardiac Rehab from 08/28/2020 in [facility] Cardiac and Pulmonary Rehab  Date 07/24/20  Educator [facility]  Instruction Review Code 1- Verbalizes Understanding      Other: -Provides group and verbal instruction on various topics (see comments)   Knowledge Questionnaire Score:  Knowledge Questionnaire Score - 04/21/20 1630      Knowledge Questionnaire Score   Pre Score 9/17            Core Components/Risk Factors/Patient Goals at Admission:  Personal Goals and Risk Factors at Admission - 04/21/20 1623      Core Components/Risk Factors/Patient Goals on Admission    Weight Management Yes    Intervention Weight Management: Develop a combined nutrition and exercise program designed to reach desired caloric intake, while maintaining appropriate intake of nutrient and fiber, sodium and fats, and appropriate energy expenditure required for the weight goal.;Weight Management: Provide education and appropriate resources to help participant work on and attain dietary goals.    Admit Weight 146 lb 11.2 oz (66.5 kg)    Goal Weight: Short Term 146 lb 11.2 oz (66.5 kg)    Goal Weight: Long Term 146 lb 11.2 oz (66.5 kg)    Expected Outcomes Short Term: Continue to assess and modify interventions until short term weight is achieved;Long Term: Adherence to nutrition and physical activity/exercise program aimed toward attainment of established weight goal;Weight Maintenance: Understanding of the daily nutrition guidelines, which includes 25-35% calories from fat, 7% or less cal from saturated fats, less than 222m cholesterol, less than 1.5gm of sodium, & 5 or more servings of fruits and vegetables daily           Education:Diabetes - Individual verbal and written instruction to review signs/symptoms of diabetes, desired ranges of glucose level fasting, after meals and with  exercise. Acknowledge that pre and post exercise glucose checks will be done for 3 sessions at entry of program.   Know Your Numbers and Heart Failure: - Group verbal and visual instruction to discuss disease risk factors for cardiac and pulmonary disease and treatment options.  Reviews associated critical values for Overweight/Obesity, Hypertension, Cholesterol, and Diabetes.  Discusses basics of heart failure: signs/symptoms and treatments.  Introduces Heart Failure Zone chart for action plan for heart failure.  Written material given at graduation.   Core Components/Risk Factors/Patient Goals Review:   Goals and Risk Factor Review    Row Name 05/29/20 1141 06/17/20 1133 07/17/20 1111 08/14/20 1121       Core Components/Risk Factors/Patient Goals Review   Personal Goals Review Weight Management/Obesity;Improve shortness of breath with ADL's Weight Management/Obesity;Improve shortness of breath with ADL's Weight Management/Obesity;Improve shortness of breath with ADL's Improve shortness of breath with ADL's  Review Spoke to patient about their shortness of breath and what they can do to improve. Patient has been informed of breathing techniques when starting the program. Patient is informed to tell staff if they have had any med changes and that certain meds they are taking or not taking can be causing shortness of breath. Zayne is taking meds as directed.  She feels her shortness of breath has improved.  She can walk further without getting out of breath.  She uses PLB when she remembers. Jakeline is doing well in rehab. Her weight is pretty steady between 145-150 lb.  She does not like it when it is up to 150 lb.  Her breathing is still limiting her ability to do things but it has improved since starting rehab.  She continues to use the PLB at home when she remembers. Spoke to patient about their shortness of breath and what they can do to improve. Patient has been informed of breathing  techniques when starting the program. Patient is informed to tell staff if they have had any med changes and that certain meds they are taking or not taking can be causing shortness of breath.    Expected Outcomes Short: Attend [name] regularly to improve shortness of breath with ADL's. Long: maintain independence with ADL's Short:  continue to exercise Long: continue to improve SOB with ADLs Short: Continue to maintain weight  Long; Continue to improve SOB Short: Attend [name] regularly to improve shortness of breath with ADL's. Long: maintain independence with ADL's           Core Components/Risk Factors/Patient Goals at Discharge (Final Review):   Goals and Risk Factor Review - 08/14/20 1121      Core Components/Risk Factors/Patient Goals Review   Personal Goals Review Improve shortness of breath with ADL's    Review Spoke to patient about their shortness of breath and what they can do to improve. Patient has been informed of breathing techniques when starting the program. Patient is informed to tell staff if they have had any med changes and that certain meds they are taking or not taking can be causing shortness of breath.    Expected Outcomes Short: Attend [name] regularly to improve shortness of breath with ADL's. Long: maintain independence with ADL's           ITP Comments:  ITP Comments    Row Name 04/21/20 1639 05/06/20 1152 05/14/20 1606 06/11/20 0557 07/09/20 0656   ITP Comments Completed 6MWT and gym orientation. Initial ITP created and sent for review to Dr. Emily Filbert, Medical Director. First full day of exercise!  Patient was oriented to gym and equipment including functions, settings, policies, and procedures.  Patient's individual exercise prescription and treatment plan were reviewed.  All starting workloads were established based on the results of the 6 minute walk test done at initial orientation visit.  The plan for exercise progression was also introduced and  progression will be customized based on patient's performance and goals. 30 day review completed. ITP sent to Dr. Emily Filbert, Medical Director of Cardiac and Pulmonary Rehab. Continue with ITP unless changes are made by physician. 30 Day review completed. Medical Director ITP review done, changes made as directed, and signed approval by Medical Director. 30 Day review completed. Medical Director ITP review done, changes made as directed, and signed approval by Medical Director.   [place] Name 08/06/20 1020 09/03/20 0604         ITP Comments 30 Day review completed. Medical  Director ITP review done, changes made as directed, and signed approval by Market researcher. 30 Day review completed. Medical Director ITP review done, changes made as directed, and signed approval by Medical Director.             Comments:

## 2020-09-04 ENCOUNTER — Other Ambulatory Visit: Payer: Self-pay

## 2020-09-04 ENCOUNTER — Encounter: Payer: Medicare Other | Admitting: *Deleted

## 2020-09-04 DIAGNOSIS — J439 Emphysema, unspecified: Secondary | ICD-10-CM | POA: Diagnosis not present

## 2020-09-04 NOTE — Progress Notes (Signed)
Daily Session Note  Patient Details  Name: Brianna Adams MRN: [id number] Date of Birth: Nov 10, 1940 Referring Provider:   Flowsheet Row Pulmonary Rehab from 04/21/2020 in [facility] Cardiac and Pulmonary Rehab  Referring Provider [name]      Encounter Date: 09/04/2020  Check In:  Session Check In - 09/04/20 1141      Check-In   Supervising physician immediately available to respond to emergencies See telemetry face sheet for immediately available ER MD    Location [facility]-Cardiac & Pulmonary Rehab    Staff Present Heath Lark, RN, BSN, CCRP;Meredith Sherryll Burger, RN BSN;Melissa Caiola RDN, LDN    Virtual Visit No    Medication changes reported     No    Fall or balance concerns reported    No    Warm-up and Cool-down Performed on first and last piece of equipment    Resistance Training Performed Yes    VAD Patient? No    PAD/SET Patient? No      Pain Assessment   Currently in Pain? No/denies              Social History   Tobacco Use  Smoking Status Former Smoker  . Packs/day: 1.00  . Years: 58.00  . Pack years: 58.00  . Types: Cigarettes  . Quit date: 10/22/2015  . Years since quitting: 4.8  Smokeless Tobacco Never Used    Goals Met:  Proper associated with RPD/PD & O2 Sat Independence with exercise equipment Exercise tolerated well No report of cardiac concerns or symptoms  Goals Unmet:  Not Applicable  Comments: Pt able to follow exercise prescription today without complaint.  Will continue to monitor for progression.    Dr. Emily Filbert is Medical Director for [place] and [name].

## 2020-09-09 ENCOUNTER — Encounter: Payer: Medicare Other | Attending: Pulmonary Disease | Admitting: *Deleted

## 2020-09-09 ENCOUNTER — Other Ambulatory Visit: Payer: Self-pay

## 2020-09-09 ENCOUNTER — Other Ambulatory Visit
Admission: RE | Admit: 2020-09-09 | Discharge: 2020-09-09 | Disposition: A | Discharge status: Home / Self Care | Payer: Medicare Other | Source: Ambulatory Visit | Attending: Pulmonary Disease | Admitting: Pulmonary Disease

## 2020-09-09 DIAGNOSIS — J439 Emphysema, unspecified: Secondary | ICD-10-CM | POA: Insufficient documentation

## 2020-09-09 DIAGNOSIS — Z20822 Contact with and (suspected) exposure to covid-19: Secondary | ICD-10-CM | POA: Insufficient documentation

## 2020-09-09 DIAGNOSIS — Z01812 Encounter for preprocedural laboratory examination: Secondary | ICD-10-CM | POA: Insufficient documentation

## 2020-09-09 LAB — SARS CORONAVIRUS 2 (TAT 6-24 HRS): SARS Coronavirus 2: NEGATIVE

## 2020-09-09 NOTE — Progress Notes (Signed)
Daily Session Note  Patient Details  Name: Brianna Adams MRN: [id number] Date of Birth: Apr 09, 1941 Referring Provider:   Flowsheet Row Pulmonary Rehab from 04/21/2020 in [facility] Cardiac and Pulmonary Rehab  Referring Provider [name]      Encounter Date: 09/09/2020  Check In:  Session Check In - 09/09/20 1200      Check-In   Supervising physician immediately available to respond to emergencies See telemetry face sheet for immediately available ER MD    Location [facility]-Cardiac & Pulmonary Rehab    Staff Present Heath Lark, RN, BSN, CCRP;Joseph Hood RCP,RRT,BSRT;Melissa Refton RDN, Rowe Pavy, [place], ACSM CEP, Exercise Physiologist    Virtual Visit No    Medication changes reported     No    Fall or balance concerns reported    No    Warm-up and Cool-down Performed on first and last piece of equipment    Resistance Training Performed Yes    VAD Patient? No    PAD/SET Patient? No      Pain Assessment   Currently in Pain? No/denies             6 Minute Walk    Row Name 04/21/20 1609 09/09/20 1129       6 Minute Walk   Phase Initial Discharge    Distance 380 feet 465 feet    Distance % Change - 22 %    Distance Feet Change - 85 ft    Walk Time 4.5 minutes 4.5 minutes    # of Rest Breaks 3 1    MPH 0.95 1.2    METS 0.8 1.43    RPE 11 15    Perceived Dyspnea  2 2    VO2 Peak 2.75 5.01    Symptoms No No    Resting HR 85 bpm 82 bpm    Resting BP 120/60 122/64    Resting Oxygen Saturation  98 % 98 %    Exercise Oxygen Saturation  during 6 min walk 90 % 81 %    Max Ex. HR 103 bpm 132 bpm    Max Ex. BP 134/66 154/64    2 Minute Post BP 130/62 -         Interval HR   1 Minute HR 102 119    2 Minute HR 93 121    3 Minute HR 100 123    4 Minute HR 100 132    5 Minute HR 101 -    6 Minute HR 103 90    2 Minute Post HR 82 105    Interval Heart Rate? Yes Yes         Interval Oxygen   Interval Oxygen? Yes -    Baseline Oxygen Saturation % 98 % 98 %    1 Minute  Oxygen Saturation % 92 % 88 %    1 Minute Liters of Oxygen 3 L 3 L    2 Minute Oxygen Saturation % 90 % 87 %    2 Minute Liters of Oxygen 3 L 3 L    3 Minute Oxygen Saturation % 94 % 84 %    3 Minute Liters of Oxygen 3 L 3 L    4 Minute Oxygen Saturation % 90 % 82 %    4 Minute Liters of Oxygen 3 L 3 L    5 Minute Oxygen Saturation % 93 % -    5 Minute Liters of Oxygen 3 L 3 L    6 Minute Oxygen  Saturation % 93 % 81 %    6 Minute Liters of Oxygen 3 L 3 L    2 Minute Post Oxygen Saturation % 98 % 92 %    2 Minute Post Liters of Oxygen 3 L 3 L              Social History   Tobacco Use  Smoking Status Former Smoker  . Packs/day: 1.00  . Years: 58.00  . Pack years: 58.00  . Types: Cigarettes  . Quit date: 10/22/2015  . Years since quitting: 4.8  Smokeless Tobacco Never Used    Goals Met:  Proper associated with RPD/PD & O2 Sat Independence with exercise equipment Exercise tolerated well No report of cardiac concerns or symptoms  Goals Unmet:  Not Applicable  Comments: Pt able to follow exercise prescription today without complaint.  Will continue to monitor for progression.    Dr. Emily Filbert is Medical Director for [name] and [name].

## 2020-09-09 NOTE — Progress Notes (Deleted)
Daily Session Note  Patient Details  Name: [name] MRN: [id number] Date of Birth: 1940/10/05 Referring Provider:   Flowsheet Row Pulmonary Rehab from 04/21/2020 in [facility] Cardiac and Pulmonary Rehab  Referring Provider [name]      Encounter Date: 09/09/2020  Check In:      Social History   Tobacco Use  Smoking Status Former Smoker  . Packs/day: 1.00  . Years: 58.00  . Pack years: 58.00  . Types: Cigarettes  . Quit date: 10/22/2015  . Years since quitting: 4.8  Smokeless Tobacco Never Used    Goals Met:  Proper associated with RPD/PD & O2 Sat Independence with exercise equipment Exercise tolerated well No report of cardiac concerns or symptoms  Goals Unmet:  Not Applicable  Comments: Pt able to follow exercise prescription today without complaint.  Will continue to monitor for progression.    Dr. Emily Filbert is Medical Director for [place] and [name].

## 2020-09-10 ENCOUNTER — Encounter (HOSPITAL_COMMUNITY): Payer: Self-pay | Admitting: Orthopedic Surgery

## 2020-09-10 NOTE — Progress Notes (Signed)
Anesthesia Chart Review: SAME DAY WORK-UP   Case: [id number] Date/Time: 09/11/20 0823   Procedure: EXCISION VOLAR RADIAL GANGLION OF LEFT WRIST (Left Wrist) - AXILLARY BLOCK   Anesthesia type: Choice   Pre-op diagnosis: VOLAR GANGLION LEFT WRIST   Location: [place] OR ROOM 06 / [place] OR   Surgeons: Daryll Brod, MD      DISCUSSION: Patient is an 80 year old female scheduled for the above procedure.  Surgery had been scheduled earlier but delayed for at least 3-4 months given history of PE in June 2021.   History includes former smoker (quit 10/22/15), COPD (nocturnal O2), non-small cell lung cancer (s/p right VATS, RLL wedge resection with small margin RML 01/25/17 [facility]; radiation to LUL), PE (RLL PE with possible right heart strain 02/29/20), pulmonary hypertension (RVSP 54.9 04/2020 echo), CHF (LVEF 50-55%, grade 1 DD 04/2020 echo, DM2, hypercholesterolemia, uterine cancer (s/p hysterectomy).   Last pulmonology evaluation 08/13/20 by Dr. [name]. Patient been participating in pulmonary rehab.  Dr. [name] wrote: "Moderate risk for proposed procedure of ganglion cyst excision May DC Eliquis 3 days prior to procedure and resume when safe from a postoperative standpoint". Long term anticoagulation recommended following PE given history of "malignancy, relatively sedentary status, and other comorbidities"  09/09/2020 presurgical COVID-19 test negative.  She is a same-day work-up, so she is for labs and anesthesia team evaluation on the day of surgery.     VS:  BP Readings from Last 3 Encounters:  08/13/20 126/70  06/02/20 124/68  05/13/20 134/72   Pulse Readings from Last 3 Encounters:  08/13/20 86  06/02/20 92  05/13/20 76    PROVIDERS: [name], [name] is PCP  Renold Don, MD is pulmonologist  LABS: For day of surgery. As of 05/13/20, Cr 0.80, LFTs WNL, A1c 6.0%. H/H 11.2/35.0 on 03/03/20.    IMAGES: CTA Chest 06/25/20: IMPRESSION: 1. Progressive consolidation of the  lingular process, likely progressive radiation fibrosis. Recommend continued surveillance. 2. Stable surgical changes in the right lower lobe without findings suspicious for recurrent tumor. 3. Stable borderline enlarged mediastinal and hilar lymph nodes. 4. Stable severe emphysematous changes and areas of pulmonary scarring. 5. No new/acute pulmonary findings or new pulmonary nodules to suggest pulmonary metastatic disease. 6. Stable multinodular thyroid goiter. Recommend thyroid US (ref: J Am Coll Radiol. - Aortic Atherosclerosis (ICD10-I70.0) and Emphysema (ICD10-J43.9).   EKG: 02/29/20 (in setting of acute PE):  Sinus tachycardia at 103 bpm Probable left atrial enlargement Borderline right axis deviation Low voltage, precordial leads Nonspecific repol abnormality, diffuse leads   CV: Echo 04/23/20: IMPRESSIONS   1. Left ventricular ejection fraction, by estimation, is 50 to 55%. The  left ventricle has low normal function. The left ventricle has no regional  wall motion abnormalities. Left ventricular diastolic parameters are  consistent with Grade I diastolic  dysfunction (impaired relaxation).  2. Right ventricular systolic function was not well visualized. The right  ventricular size is normal. There is moderately elevated pulmonary artery  systolic pressure. The estimated right ventricular systolic pressure is  21.3 mmHg.  3. Mild mitral valve regurgitation.  4. Challenging images  (Comparison 03/01/20: EV 55-60%, RVSP 61.6 mmHg, moderate MR in setting of acute PE)  Negative DVT BLE on 02/29/20 venous US.   Past Medical History:  Diagnosis Date  . CHF (congestive heart failure) ([place])   . COPD (chronic obstructive pulmonary disease) ([place])   . Diabetes ([place])   . Diverticulitis   . High cholesterol   . Lung cancer (  [place])   . PE (pulmonary thromboembolism) ([place]) 02/29/2020  . Pulmonary hypertension ([place])   . Uterine cancer [facility])     Past Surgical History:   Procedure Laterality Date  . ABDOMINAL HYSTERECTOMY    . cataract surgery  2012 and 2017  . FIBEROPTIC BRONCHOSCOPY    . resection of lung cancer  2018  . right lower lobe non-anatomocal lung resection wedge    . right thorascoscopy      MEDICATIONS: No current facility-administered medications for this encounter.   Marland Kitchen acetaminophen (TYLENOL) 500 MG tablet  . albuterol (VENTOLIN HFA) 108 (90 Base) MCG/ACT inhaler  . apixaban (ELIQUIS) 5 MG TABS tablet  . calcium carbonate (OSCAL) 1500 (600 Ca) MG TABS tablet  . Cinnamon 500 MG TABS  . furosemide (LASIX) 20 MG tablet  . metFORMIN (GLUCOPHAGE) 1000 MG tablet  . Multiple Vitamins-Minerals (CENTRUM SILVER PO)  . Multiple Vitamins-Minerals (OCUVITE EYE HEALTH FORMULA PO)  . omeprazole (PRILOSEC) 40 MG capsule  . pravastatin (PRAVACHOL) 10 MG tablet  . umeclidinium-vilanterol (ANORO ELLIPTA) 62.5-25 MCG/INH AEPB  . vitamin C (ASCORBIC ACID) 500 MG tablet    Myra Gianotti, PA-C Surgical Short Stay/Anesthesiology [facility] Phone [phone] [facility] Phone [phone] 09/10/2020 12:17 PM

## 2020-09-10 NOTE — Progress Notes (Signed)
Spoke with patient's Daughter Sherena Machorro cell [phone].  Benita states patient does not have any shortness of breath, fever, cough or chest pain.  PCP - [name], [name] Cardiologist - n/a Pulmonary - Dr Roberts Gaudy  Chest x-ray - CT Chest 06/26/20 EKG - 02/29/20 Stress Test - n/a ECHO - 04/23/20 Cardiac Cath - n/a  Fasting Blood Sugar - Unknown Checks Blood Sugar 0 times a day . Do not take oral diabetes medicines (metformin) the morning of surgery.  Blood Thinner Instructions:  Follow your surgeon's instructions on when to stop eliquis prior to surgery.  ERAS: Clear liquads til 0530 on day of surgery.   Anesthesia review: Yes  STOP now taking any Aspirin (unless otherwise instructed by your surgeon), Aleve, Naproxen, Ibuprofen, Motrin, Advil, Goody's, BC's, all herbal medications, fish oil, and all vitamins.   Coronavirus Screening Covid test on 09/09/20 was negative.  Daughter [name] verbalized understanding of instructions that were given via phone.

## 2020-09-10 NOTE — Anesthesia Preprocedure Evaluation (Addendum)
Anesthesia Evaluation  Patient identified by MRN, date of birth, ID band Patient awake    Reviewed: Allergy & Precautions, NPO status , Patient's Chart, lab work & pertinent test results  Airway Mallampati: II  TM Distance: >3 FB Neck ROM: Full    Dental no notable dental hx.    Pulmonary shortness of breath, COPD,  COPD inhaler, former smoker, PE non-small cell lung cancer (s/p right VATS, RLL wedge resection with small margin RML 01/25/17 [facility]; radiation to LUL   Pulmonary exam normal breath sounds clear to auscultation       Cardiovascular +CHF  Normal cardiovascular exam Rhythm:Regular Rate:Normal  TTE 04/2020 1. Left ventricular ejection fraction, by estimation, is 50 to 55%. The left ventricle has low normal function. The left ventricle has no regional wall motion abnormalities. Left ventricular diastolic parameters are consistent with Grade I diastolic dysfunction (impaired relaxation).  2. Right ventricular systolic function was not well visualized. The right ventricular size is normal. There is moderately elevated pulmonary artery systolic pressure. The estimated right ventricular systolic pressure is 41.9 mmHg.  3. Mild mitral valve regurgitation.  4. Challenging images   Pulmonary hypertension   Neuro/Psych negative neurological ROS  negative psych ROS   GI/Hepatic Neg liver ROS, GERD  Medicated and Controlled,  Endo/Other  negative endocrine ROSdiabetes, Type 2, Oral Hypoglycemic Agents  Renal/GU negative Renal ROS  negative genitourinary   Musculoskeletal negative musculoskeletal ROS (+)   Abdominal   Peds  Hematology  (+) Blood dyscrasia (on eliquis), ,   Anesthesia Other Findings   Reproductive/Obstetrics                           Anesthesia Physical Anesthesia Plan  ASA: III  Anesthesia Plan: MAC and Regional   Post-op Pain Management:  Regional for Post-op  pain   Induction: Intravenous  PONV Risk Score and Plan: 2 and Propofol infusion, Treatment may vary due to age or medical condition, Ondansetron and Dexamethasone  Airway Management Planned: Natural Airway  Additional Equipment:   Intra-op Plan:   Post-operative Plan:   Informed Consent: I have reviewed the patients History and Physical, chart, labs and discussed the procedure including the risks, benefits and alternatives for the proposed anesthesia with the patient or authorized representative who has indicated his/her understanding and acceptance.     Dental advisory given  Plan Discussed with: CRNA  Anesthesia Plan Comments: ( )       Anesthesia Quick Evaluation

## 2020-09-11 ENCOUNTER — Encounter (HOSPITAL_COMMUNITY)
Admission: RE | Disposition: A | Discharge status: Home / Self Care | Payer: Self-pay | Source: Home / Self Care | Attending: Orthopedic Surgery

## 2020-09-11 ENCOUNTER — Ambulatory Visit (HOSPITAL_COMMUNITY): Payer: Medicare Other | Admitting: Certified Registered Nurse Anesthetist

## 2020-09-11 ENCOUNTER — Ambulatory Visit (HOSPITAL_COMMUNITY)
Admission: RE | Admit: 2020-09-11 | Discharge: 2020-09-11 | Disposition: A | Discharge status: Home / Self Care | Payer: Medicare Other | Attending: Orthopedic Surgery | Admitting: Orthopedic Surgery

## 2020-09-11 ENCOUNTER — Other Ambulatory Visit: Payer: Self-pay

## 2020-09-11 ENCOUNTER — Encounter (HOSPITAL_COMMUNITY): Payer: Self-pay | Admitting: Orthopedic Surgery

## 2020-09-11 DIAGNOSIS — E119 Type 2 diabetes mellitus without complications: Secondary | ICD-10-CM | POA: Insufficient documentation

## 2020-09-11 DIAGNOSIS — Z7984 Long term (current) use of oral hypoglycemic drugs: Secondary | ICD-10-CM | POA: Diagnosis not present

## 2020-09-11 DIAGNOSIS — R0600 Dyspnea, unspecified: Secondary | ICD-10-CM | POA: Diagnosis not present

## 2020-09-11 DIAGNOSIS — I110 Hypertensive heart disease with heart failure: Secondary | ICD-10-CM | POA: Diagnosis not present

## 2020-09-11 DIAGNOSIS — Z7901 Long term (current) use of anticoagulants: Secondary | ICD-10-CM | POA: Insufficient documentation

## 2020-09-11 DIAGNOSIS — J449 Chronic obstructive pulmonary disease, unspecified: Secondary | ICD-10-CM | POA: Insufficient documentation

## 2020-09-11 DIAGNOSIS — Z880 Allergy status to penicillin: Secondary | ICD-10-CM | POA: Diagnosis not present

## 2020-09-11 DIAGNOSIS — Z888 Allergy status to other drugs, medicaments and biological substances status: Secondary | ICD-10-CM | POA: Insufficient documentation

## 2020-09-11 DIAGNOSIS — M67432 Ganglion, left wrist: Secondary | ICD-10-CM | POA: Diagnosis not present

## 2020-09-11 DIAGNOSIS — Z8542 Personal history of malignant neoplasm of other parts of uterus: Secondary | ICD-10-CM | POA: Insufficient documentation

## 2020-09-11 DIAGNOSIS — Z79899 Other long term (current) drug therapy: Secondary | ICD-10-CM | POA: Insufficient documentation

## 2020-09-11 DIAGNOSIS — Z85118 Personal history of other malignant neoplasm of bronchus and lung: Secondary | ICD-10-CM | POA: Insufficient documentation

## 2020-09-11 DIAGNOSIS — Z8249 Family history of ischemic heart disease and other diseases of the circulatory system: Secondary | ICD-10-CM | POA: Diagnosis not present

## 2020-09-11 DIAGNOSIS — Z87891 Personal history of nicotine dependence: Secondary | ICD-10-CM | POA: Insufficient documentation

## 2020-09-11 DIAGNOSIS — Z809 Family history of malignant neoplasm, unspecified: Secondary | ICD-10-CM | POA: Diagnosis not present

## 2020-09-11 DIAGNOSIS — Z9981 Dependence on supplemental oxygen: Secondary | ICD-10-CM | POA: Insufficient documentation

## 2020-09-11 DIAGNOSIS — I509 Heart failure, unspecified: Secondary | ICD-10-CM | POA: Diagnosis not present

## 2020-09-11 DIAGNOSIS — I272 Pulmonary hypertension, unspecified: Secondary | ICD-10-CM | POA: Insufficient documentation

## 2020-09-11 DIAGNOSIS — Z86711 Personal history of pulmonary embolism: Secondary | ICD-10-CM | POA: Insufficient documentation

## 2020-09-11 HISTORY — DX: Gastro-esophageal reflux disease without esophagitis: K21.9

## 2020-09-11 HISTORY — DX: Dyspnea, unspecified: R06.00

## 2020-09-11 HISTORY — PX: GANGLION CYST EXCISION: SHX1691

## 2020-09-11 LAB — BASIC METABOLIC PANEL
Anion gap: 11 (ref 5–15)
BUN: 20 mg/dL (ref 8–23)
CO2: 22 mmol/L (ref 22–32)
Calcium: 9.8 mg/dL (ref 8.9–10.3)
Chloride: 103 mmol/L (ref 98–111)
Creatinine, Ser: 0.89 mg/dL (ref 0.44–1.00)
GFR, Estimated: >60 mL/min (ref >60)
Glucose, Bld: 155 mg/dL — ABNORMAL HIGH (ref 70–99)
Potassium: 4.2 mmol/L (ref 3.5–5.1)
Sodium: 136 mmol/L (ref 135–145)

## 2020-09-11 LAB — GLUCOSE, CAPILLARY
Glucose-Capillary: 163 mg/dL — ABNORMAL HIGH (ref 70–99)
Glucose-Capillary: 130 mg/dL — ABNORMAL HIGH (ref 70–99)

## 2020-09-11 SURGERY — EXCISION, GANGLION CYST, WRIST
Anesthesia: Monitor Anesthesia Care | Site: Wrist | Laterality: Left

## 2020-09-11 MED ORDER — LIDOCAINE 2% (20 MG/ML) 5 ML SYRINGE
INTRAMUSCULAR | Status: DC | PRN
  Administered 2020-09-11: 40 mg via INTRAVENOUS

## 2020-09-11 MED ORDER — FENTANYL CITRATE (PF) 100 MCG/2ML IJ SOLN
50.0000 ug | Freq: Once | INTRAMUSCULAR | Status: AC
Start: 2020-09-11 — End: 2020-09-11

## 2020-09-11 MED ORDER — DEXAMETHASONE SODIUM PHOSPHATE 10 MG/ML IJ SOLN
INTRAMUSCULAR | Status: DC | PRN
  Administered 2020-09-11: 5 mg

## 2020-09-11 MED ORDER — PROPOFOL 500 MG/50ML IV EMUL
INTRAVENOUS | Status: DC | PRN
  Administered 2020-09-11: 30 ug/kg/min via INTRAVENOUS

## 2020-09-11 MED ORDER — TRAMADOL HCL 50 MG PO TABS: 50.0000 mg | ORAL_TABLET | Freq: Four times a day (QID) | ORAL | 0 refills | Status: DC | PRN

## 2020-09-11 MED ORDER — 0.9 % SODIUM CHLORIDE (POUR BTL) OPTIME
TOPICAL | Status: DC | PRN
  Administered 2020-09-11: 1000 mL

## 2020-09-11 MED ORDER — FENTANYL CITRATE (PF) 100 MCG/2ML IJ SOLN: 25.0000 ug | INTRAMUSCULAR | Status: DC | PRN

## 2020-09-11 MED ORDER — CHLORHEXIDINE GLUCONATE 0.12 % MT SOLN
15.0000 mL | Freq: Once | OROMUCOSAL | Status: AC
  Administered 2020-09-11: 15 mL via OROMUCOSAL
  Filled 2020-09-11: qty 15

## 2020-09-11 MED ORDER — ORAL CARE MOUTH RINSE: 15.0000 mL | Freq: Once | OROMUCOSAL | Status: AC

## 2020-09-11 MED ORDER — CLINDAMYCIN PHOSPHATE 900 MG/50ML IV SOLN
900.0000 mg | INTRAVENOUS | Status: AC
  Administered 2020-09-11: 900 mg via INTRAVENOUS
  Filled 2020-09-11: qty 50

## 2020-09-11 MED ORDER — FENTANYL CITRATE (PF) 100 MCG/2ML IJ SOLN
INTRAMUSCULAR | Status: AC
  Administered 2020-09-11: 50 ug via INTRAVENOUS
  Filled 2020-09-11: qty 2

## 2020-09-11 MED ORDER — LACTATED RINGERS IV SOLN: INTRAVENOUS | Status: DC

## 2020-09-11 MED ORDER — ROPIVACAINE HCL 5 MG/ML IJ SOLN
INTRAMUSCULAR | Status: DC | PRN
  Administered 2020-09-11: 20 mL via PERINEURAL

## 2020-09-11 SURGICAL SUPPLY — 46 items
BAND RUBBER #18 3X1/16 STRL (MISCELLANEOUS) ×2 IMPLANT
BLADE MINI RND TIP GREEN BEAV (BLADE) IMPLANT
BNDG COHESIVE 3X5 TAN STRL LF (GAUZE/BANDAGES/DRESSINGS) ×2 IMPLANT
BNDG ESMARK 4X9 LF (GAUZE/BANDAGES/DRESSINGS) ×2 IMPLANT
BNDG GAUZE ELAST 4 BULKY (GAUZE/BANDAGES/DRESSINGS) ×2 IMPLANT
CNTNR URN SCR LID CUP LEK RST (MISCELLANEOUS) ×1 IMPLANT
CONT SPEC 4OZ STRL OR WHT (MISCELLANEOUS) ×1
CORD BIPOLAR FORCEPS 12FT (ELECTRODE) ×2 IMPLANT
COVER SURGICAL LIGHT HANDLE (MISCELLANEOUS) ×2 IMPLANT
CUFF TOURN SGL QUICK 18X4 (TOURNIQUET CUFF) ×2 IMPLANT
CUFF TOURN SGL QUICK 24 (TOURNIQUET CUFF)
CUFF TRNQT CYL 24X4X16.5-23 (TOURNIQUET CUFF) IMPLANT
DECANTER SPIKE VIAL GLASS SM (MISCELLANEOUS) IMPLANT
DRAPE OEC MINIVIEW 54X84 (DRAPES) IMPLANT
DRSG XEROFORM 1X8 (GAUZE/BANDAGES/DRESSINGS) ×2 IMPLANT
GAUZE SPONGE 4X4 12PLY STRL (GAUZE/BANDAGES/DRESSINGS) ×2 IMPLANT
GAUZE XEROFORM 1X8 LF (GAUZE/BANDAGES/DRESSINGS) ×2 IMPLANT
GOWN STRL REUS W/ TWL LRG LVL3 (GOWN DISPOSABLE) ×2 IMPLANT
GOWN STRL REUS W/ TWL XL LVL3 (GOWN DISPOSABLE) ×1 IMPLANT
GOWN STRL REUS W/TWL LRG LVL3 (GOWN DISPOSABLE) ×2
GOWN STRL REUS W/TWL XL LVL3 (GOWN DISPOSABLE) ×1
KIT BASIN OR (CUSTOM PROCEDURE TRAY) ×2 IMPLANT
KIT TURNOVER KIT B (KITS) ×2 IMPLANT
MANIFOLD NEPTUNE II (INSTRUMENTS) ×2 IMPLANT
NEEDLE HYPO 25GX1X1/2 BEV (NEEDLE) IMPLANT
NS IRRIG 1000ML POUR BTL (IV SOLUTION) ×2 IMPLANT
PACK ORTHO EXTREMITY (CUSTOM PROCEDURE TRAY) ×2 IMPLANT
PAD ARMBOARD 7.5X6 YLW CONV (MISCELLANEOUS) ×4 IMPLANT
PAD CAST 3X4 CTTN HI CHSV (CAST SUPPLIES) ×1 IMPLANT
PAD CAST 4YDX4 CTTN HI CHSV (CAST SUPPLIES) IMPLANT
PADDING CAST COTTON 3X4 STRL (CAST SUPPLIES) ×1
PADDING CAST COTTON 4X4 STRL (CAST SUPPLIES)
PADDING UNDERCAST 2 STRL (CAST SUPPLIES) ×1
PADDING UNDERCAST 2X4 STRL (CAST SUPPLIES) ×1 IMPLANT
SPECIMEN JAR SMALL (MISCELLANEOUS) ×2 IMPLANT
SUT ETHILON 4 0 PS 2 18 (SUTURE) ×2 IMPLANT
SUT ETHILON 5 0 PS 2 18 (SUTURE) IMPLANT
SUT SILK 4 0 PS 2 (SUTURE) IMPLANT
SUT VICRYL 4-0 PS2 18IN ABS (SUTURE) IMPLANT
SUT VICRYL RAPIDE 4/0 PS 2 (SUTURE) ×2 IMPLANT
SYR CONTROL 10ML LL (SYRINGE) IMPLANT
TOWEL GREEN STERILE (TOWEL DISPOSABLE) ×2 IMPLANT
TOWEL GREEN STERILE FF (TOWEL DISPOSABLE) ×2 IMPLANT
TUBE CONNECTING 12X1/4 (SUCTIONS) ×2 IMPLANT
UNDERPAD 30X36 HEAVY ABSORB (UNDERPADS AND DIAPERS) ×2 IMPLANT
WATER STERILE IRR 1000ML POUR (IV SOLUTION) ×2 IMPLANT

## 2020-09-11 NOTE — Anesthesia Procedure Notes (Signed)
Anesthesia Regional Block: Supraclavicular block   Pre-Anesthetic Checklist: ,, timeout performed, Correct Patient, Correct Site, Correct Laterality, Correct Procedure, Correct Position, site marked, Risks and benefits discussed,  Surgical consent,  Pre-op evaluation,  At surgeon's request and post-op pain management  Laterality: Left  Prep: Maximum Sterile Barrier Precautions used, chloraprep       Needles:  Injection technique: Single-shot  Needle Type: Echogenic Stimulator Needle     Needle Length: 4cm  Needle Gauge: 22     Additional Needles:   Procedures:,,,, ultrasound used (permanent image in chart),,,,  Narrative:  Start time: 09/11/2020 8:15 AM End time: 09/11/2020 8:25 AM Injection made incrementally with aspirations every 5 mL.  Performed by: Personally  Anesthesiologist: Freddrick March, MD  Additional Notes: Monitors applied. No increased pain on injection. No increased resistance to injection. Injection made in 5cc increments. Good needle visualization. Patient tolerated procedure well.

## 2020-09-11 NOTE — Anesthesia Postprocedure Evaluation (Signed)
Anesthesia Post Note  Patient: Brianna Adams  Procedure(s) Performed: EXCISION VOLAR RADIAL GANGLION OF LEFT WRIST (Left Wrist)     Patient location during evaluation: PACU Anesthesia Type: Regional and MAC Level of consciousness: awake and alert Pain management: pain level controlled Vital Signs Assessment: post-procedure vital signs reviewed and stable Respiratory status: spontaneous breathing, nonlabored ventilation, respiratory function stable and patient connected to nasal cannula oxygen Cardiovascular status: stable and blood pressure returned to baseline Postop Assessment: no apparent nausea or vomiting Anesthetic complications: no   No complications documented.  Last Vitals:  Vitals:   09/11/20 1005 09/11/20 1015  BP: (!) 146/66 (!) 160/68  Pulse: 71 69  Resp: 20 20  Temp:    SpO2: 95% 96%    Last Pain: There were no vitals filed for this visit.               [place]

## 2020-09-11 NOTE — Op Note (Signed)
NAME: Brianna Adams MEDICAL RECORD NO: [id number] DATE OF BIRTH: 12/01/1940 FACILITY: [facility] LOCATION: [facility] OR PHYSICIAN: Wynonia Sours, MD   OPERATIVE REPORT   DATE OF PROCEDURE: 09/11/20    PREOPERATIVE DIAGNOSIS:   Volar wrist ganglion left wrist   POSTOPERATIVE DIAGNOSIS:   Same   PROCEDURE:   Excision volar radial wrist ganglion   SURGEON: Daryll Brod, M.D.   ASSISTANT: none   ANESTHESIA:  Regional with sedation   INTRAVENOUS FLUIDS:  Per anesthesia flow sheet.   ESTIMATED BLOOD LOSS:  Minimal.   COMPLICATIONS:  None.   SPECIMENS:   Ganglion 2 and half centimeters   TOURNIQUET TIME:    Total Tourniquet Time Documented: Upper Arm (Left) - 25 minutes Total: Upper Arm (Left) - 25 minutes    DISPOSITION:  Stable to PACU.   INDICATIONS: Patient is a 80 year old female with a large volar radial wrist ganglion.  She has elected to have this surgically excised.  Pre-.  Postoperative course been discussed along with risk and complications.  She is aware that there is no guarantee to the surgery the possibility of infection recurrence injury to arteries nerves tendons complete relief symptoms and dystrophy.  Preoperative patient seen extremity marked by both patient and surgeon antibiotic given.  A supraclavicular block was carried out without difficulty under the direction of the anesthesia department.  OPERATIVE COURSE: She was brought to the operating placed in supine position prepped using ChloraPrep.  A 3-minute dry time was allowed timeout taken confirming patient procedure.  A curvilinear incision was made over the mass after exsanguination of the limb with an Esmarch bandage and inflation of a tourniquet on the upper arm 250 mmHg.  A cyst was immediately encountered.  This was deflated it had measured approximately 2-1/2 cm was multilobulated in nature blunt sharp dissection isolated disc taken taking care to protect the radial artery and volar branch.  The dissection was  carried down to the radiocarpal joint.  The cyst expressed directly under the radioscaphocapitate ligament.  This area was debrided with a rongeured.  The specimen was sent to pathology.  The wound was copious irrigated with saline.  Subcutaneous tissue was closed with 4-0 Vicryl and the skin wound was closed with interrupted 4-0 nylon sutures.  A sterile compressive dressing volar splint was applied.  Deflation of the tourniquet all fingers immediately pink.  She was taken to the recovery room for observation in satisfactory condition.  Will be discharged home to return to the hand center [facility] in 1 week on Tylenol ibuprofen for pain with Ultram for breakthrough.   Daryll Brod, MD Electronically signed, 09/11/20

## 2020-09-11 NOTE — Brief Op Note (Signed)
09/11/2020  9:37 AM  PATIENT:  Brianna Adams  80 y.o. female  PRE-OPERATIVE DIAGNOSIS:  VOLAR GANGLION LEFT WRIST  POST-OPERATIVE DIAGNOSIS:  VOLAR GANGLION LEFT WRIST  PROCEDURE:  Procedure(s) with comments: EXCISION VOLAR RADIAL GANGLION OF LEFT WRIST (Left) - AXILLARY BLOCK  SURGEON:  Surgeon(s) and Role:    * Daryll Brod, MD - Primary  PHYSICIAN ASSISTANT:   ASSISTANTS: none   ANESTHESIA:   regional and IV sedation  EBL:  2 mL   BLOOD ADMINISTERED:none  DRAINS: none   LOCAL MEDICATIONS USED:  NONE  SPECIMEN:  Excision  DISPOSITION OF SPECIMEN:  PATHOLOGY  COUNTS:  YES  TOURNIQUET:   Total Tourniquet Time Documented: Upper Arm (Left) - 25 minutes Total: Upper Arm (Left) - 25 minutes   DICTATION: .Viviann Spare Dictation  PLAN OF CARE: Discharge to home after PACU  PATIENT DISPOSITION:  PACU - hemodynamically stable.

## 2020-09-11 NOTE — Transfer of Care (Signed)
Immediate Anesthesia Transfer of Care Note  Patient: Brianna Adams  Procedure(s) Performed: EXCISION VOLAR RADIAL GANGLION OF LEFT WRIST (Left Wrist)  Patient Location: PACU  Anesthesia Type:MAC combined with regional for post-op pain  Level of Consciousness: awake, alert  and oriented  Airway & Oxygen Therapy: Patient Spontanous Breathing and Patient connected to nasal cannula oxygen  Post-op Assessment: Report given to RN and Post -op Vital signs reviewed and stable  Post vital signs: Reviewed and stable  Last Vitals:  Vitals Value Taken Time  BP    Temp    Pulse    Resp    SpO2      Last Pain: There were no vitals filed for this visit.       Complications: No complications documented.

## 2020-09-11 NOTE — OR Nursing (Addendum)
Pt is awake,alert and oriented. Pt is in NAD at this time. Pt and family verbalized understanding of poc and discharge instructions. instructions given to family and reviewed prior to discharge.Pt is ambulatory to wheelchair with stand by assist but not needed with steady gait.  Pt taken to front lobby and placed in car with family.   Pt is on con't 02 at home, pt has two tanks , pt placed on baseline 3l/min [place] to car. Pt in car on self 02 tank, family driving

## 2020-09-11 NOTE — Anesthesia Procedure Notes (Signed)
Procedure Name: MAC Date/Time: 09/11/2020 8:57 AM Performed by: Valda Favia, CRNA Pre-anesthesia Checklist: Patient identified, Emergency Drugs available, Suction available, Patient being monitored and Timeout performed Patient Re-evaluated:Patient Re-evaluated prior to induction Oxygen Delivery Method: Nasal cannula

## 2020-09-11 NOTE — H&P (Signed)
Brianna Adams is an 80 y.o. female.   Chief Complaint: mass left wrist HPI: Brianna Adams is a 80 year old right-hand-dominant female referred by Darleen Crocker nurse practitioner for consultation regarding a mass on the volar aspect of the left wrist. This been present for a year. Has been gradually enlarging. Is not causing any pain or discomfort. Not had any treatment for. She states nothing makes it better or worse. She has no history of injury. She has a history of diabetes no history of thyroid problems arthritis or gout. Family history is negative for each of these.     Past Medical History:  Diagnosis Date  . CHF (congestive heart failure) ([place])   . COPD (chronic obstructive pulmonary disease) ([place])   . Diabetes ([place])    type 2  . Diverticulitis   . Dyspnea    On Oxygen 3L via [place]   . GERD (gastroesophageal reflux disease)   . High cholesterol   . Lung cancer ([place])   . PE (pulmonary thromboembolism) ([place]) 02/29/2020  . Pulmonary hypertension ([place])   . Uterine cancer [facility])     Past Surgical History:  Procedure Laterality Date  . ABDOMINAL HYSTERECTOMY    . cataract surgery  2012 and 2017  . COLONOSCOPY    . FIBEROPTIC BRONCHOSCOPY    . resection of lung cancer  2018  . right lower lobe non-anatomocal lung resection wedge    . right thorascoscopy      Family History  Problem Relation Age of Onset  . Hypertension Mother   . Heart attack Father   . Cancer Maternal Aunt    Social History:  reports that she quit smoking about 4 years ago. Her smoking use included cigarettes. She has a 58.00 pack-year smoking history. She has never used smokeless tobacco. She reports that she does not drink alcohol and does not use drugs.  Allergies:  Allergies  Allergen Reactions  . Penicillin G Shortness Of Breath    Other reaction(s): Rash  . Ace Inhibitors Swelling  . Atorvastatin Other (See Comments)    Other reaction(s): Myalgias (Muscle Pain)   . Cortisone Other (See Comments)     Other reaction(s): Patient not sure  . Hydrocortisone Other (See Comments)    unknown  . Lisinopril Other (See Comments)    Other reaction(s): Angioedema  . Meloxicam Other (See Comments)    Bloody stools  . Penicillins Hives    60 years ago  . Pravastatin Other (See Comments)     Chest Pain High dosage     No medications prior to admission.    Results for orders placed or performed during the hospital encounter of 09/09/20 (from the past 48 hour(s))  SARS CORONAVIRUS 2 (TAT 6-24 HRS) Nasopharyngeal Nasopharyngeal Swab     Status: None   Collection Time: 09/09/20 12:47 PM   Specimen: Nasopharyngeal Swab  Result Value Ref Range   SARS Coronavirus 2 NEGATIVE NEGATIVE    Comment: (NOTE) SARS-CoV-2 target nucleic acids are NOT DETECTED.  The SARS-CoV-2 RNA is generally detectable in upper and lower respiratory specimens during the acute phase of infection. Negative results do not preclude SARS-CoV-2 infection, do not rule out co-infections with other pathogens, and should not be used as the sole basis for treatment or other patient management decisions. Negative results must be combined with clinical observations, patient history, and epidemiological information. The expected result is Negative.  Fact Sheet for Patients: SugarRoll.be  Fact Sheet for Healthcare Providers: https://www.woods-mathews.com/  This test is not  yet approved or cleared by the [place] and  has been authorized for detection and/or diagnosis of SARS-CoV-2 by FDA under an Emergency Use Authorization (EUA). This EUA will remain  in effect (meaning this test can be used) for the duration of the COVID-19 declaration under Se ction 564(b)(1) of the Act, 21 U.S.C. section 360bbb-3(b)(1), unless the authorization is terminated or revoked sooner.  Performed at [facility], [place] [address], [place], [place] [address]     No results  found.   Pertinent items are noted in HPI.  Height _0  (1.575 m), weight 70.3 kg.  General appearance: alert, cooperative and appears stated age Head: Normocephalic, without obvious abnormality Neck: no JVD Resp: clear to auscultation bilaterally Cardio: regular rate and rhythm, S1, S2 normal, no murmur, click, rub or gallop GI: soft, non-tender; bowel sounds normal; no masses,  no organomegaly Extremities: mass left wrist Pulses: 2+ and symmetric Skin: Skin color, texture, turgor normal. No rashes or lesions Neurologic: Grossly normal Incision/Wound: na  Assessment/Plan Assessment:  1. Ganglion of left wrist    Plan: She would like to have the cyst removed. Preperi-and postoperative course been discussed along with risk and complications. She is aware there is no guarantee to the surgery the possibility of infection recurrence injury to arteries nerves tendons incomplete relief symptoms dystrophy. She is aware of a 20% recurrence rate with volar radial wrist ganglions. This will be scheduled as an outpatient under regional anesthesia. This will be for excision of volar radial wrist ganglion left wrist.     Daryll Brod 09/11/2020, 5:29 AM

## 2020-09-11 NOTE — Progress Notes (Signed)
Orthopedic Tech Progress Note Patient Details:  Brianna Adams [birth year] [id number]  Ortho Devices Type of Ortho Device: Arm sling Ortho Device/Splint Location: lue Ortho Device/Splint Interventions: Ordered,Application,Adjustment   Post Interventions Patient Tolerated: Well Instructions Provided: Care of device,Adjustment of device,Poper ambulation with device   [name] 09/11/2020, 11:17 AM

## 2020-09-12 ENCOUNTER — Encounter (HOSPITAL_COMMUNITY): Payer: Self-pay | Admitting: Orthopedic Surgery

## 2020-09-12 LAB — SURGICAL PATHOLOGY

## 2020-09-24 NOTE — Progress Notes (Signed)
Brianna Adams has been out for wrist surgery since last review.

## 2020-09-25 ENCOUNTER — Other Ambulatory Visit: Payer: Self-pay | Admitting: Nurse Practitioner

## 2020-09-30 ENCOUNTER — Ambulatory Visit: Payer: Medicare Other

## 2020-10-01 ENCOUNTER — Encounter: Payer: Self-pay | Admitting: *Deleted

## 2020-10-01 DIAGNOSIS — J439 Emphysema, unspecified: Secondary | ICD-10-CM

## 2020-10-01 NOTE — Progress Notes (Signed)
Pulmonary Individual Treatment Plan  Patient Details  Name: Brianna Adams MRN: [id number] Date of Birth: 01-15-1941 Referring Provider:   Flowsheet Row Pulmonary Rehab from 04/21/2020 in [facility] Cardiac and Pulmonary Rehab  Referring Provider [name]      Initial Encounter Date:  Flowsheet Row Pulmonary Rehab from 04/21/2020 in [facility] Cardiac and Pulmonary Rehab  Date 04/21/20      Visit Diagnosis: Pulmonary emphysema, unspecified emphysema type ([place])  Patient's Home Medications on Admission:  Current Outpatient Medications:  .  acetaminophen (TYLENOL) 500 MG tablet, Take 1,000 mg by mouth every 6 (six) hours as needed for mild pain or fever. , Disp: , Rfl:  .  albuterol (VENTOLIN HFA) 108 (90 Base) MCG/ACT inhaler, Inhale 2 puffs into the lungs every 6 (six) hours as needed for wheezing or shortness of breath., Disp: 18 g, Rfl: 0 .  apixaban (ELIQUIS) 5 MG TABS tablet, Take 1 tablet (5 mg total) by mouth 2 (two) times daily., Disp: 90 tablet, Rfl: 1 .  calcium carbonate (OSCAL) 1500 (600 Ca) MG TABS tablet, Take 1,200 mg by mouth daily., Disp: , Rfl:  .  Cinnamon 500 MG TABS, Take 1,000 mg by mouth daily. , Disp: , Rfl:  .  furosemide (LASIX) 20 MG tablet, TAKE 1 TABLET BY MOUTH EVERY DAY (Patient taking differently: Take 20 mg by mouth daily.), Disp: 90 tablet, Rfl: 2 .  metFORMIN (GLUCOPHAGE) 1000 MG tablet, TAKE 1 TABLET (1,000 MG TOTAL) BY MOUTH 2 (TWO) TIMES DAILY WITH A MEAL., Disp: 180 tablet, Rfl: 0 .  Multiple Vitamins-Minerals (CENTRUM SILVER PO), Take 1 tablet by mouth daily. , Disp: , Rfl:  .  Multiple Vitamins-Minerals (OCUVITE EYE HEALTH FORMULA PO), Take 1 capsule by mouth daily., Disp: , Rfl:  .  omeprazole (PRILOSEC) 40 MG capsule, Take 1 capsule (40 mg total) by mouth daily., Disp: 90 capsule, Rfl: 1 .  pravastatin (PRAVACHOL) 10 MG tablet, TAKE 1 TABLET BY MOUTH EVERY DAY (Patient taking differently: Take 10 mg by mouth daily.), Disp: 90 tablet, Rfl: 1 .  traMADol  (ULTRAM) 50 MG tablet, Take 1 tablet (50 mg total) by mouth every 6 (six) hours as needed., Disp: 20 tablet, Rfl: 0 .  umeclidinium-vilanterol (ANORO ELLIPTA) 62.5-25 MCG/INH AEPB, Inhale 1 puff into the lungs daily., Disp: 60 each, Rfl: 5 .  vitamin C (ASCORBIC ACID) 500 MG tablet, Take 500 mg by mouth daily. , Disp: , Rfl:   Past Medical History: Past Medical History:  Diagnosis Date  . CHF (congestive heart failure) ([place])   . COPD (chronic obstructive pulmonary disease) ([place])   . Diabetes ([place])    type 2  . Diverticulitis   . Dyspnea    On Oxygen 3L via [place]   . GERD (gastroesophageal reflux disease)   . High cholesterol   . Lung cancer ([place])   . PE (pulmonary thromboembolism) ([place]) 02/29/2020  . Pulmonary hypertension ([place])   . Uterine cancer ([place])     Tobacco Use: Social History   Tobacco Use  Smoking Status Former Smoker  . Packs/day: 1.00  . Years: 58.00  . Pack years: 58.00  . Types: Cigarettes  . Quit date: 10/22/2015  . Years since quitting: 4.9  Smokeless Tobacco Never Used    Labs: Recent Review Flowsheet Data    Labs for ITP Cardiac and Pulmonary Rehab Latest Ref Rng & Units 06/14/2019 10/04/2019 01/07/2020 02/29/2020 05/13/2020   Cholestrol 100 - 199 mg/dL - 152 149 - 168   LDLCALC 0 -  99 mg/dL - 80 73 - 94   HDL >39 mg/dL - 57 61 - 61   Trlycerides 0 - 149 mg/dL - 76 80 - 67   Hemoglobin A1c 4.8 - 5.6 % 6.3(H) 6.2(H) 5.8(H) - 6.0(H)   HCO3 20.0 - 28.0 mmol/L - - - 19.0(L) -   ACIDBASEDEF 0.0 - 2.0 mmol/L - - - 4.0(H) -   O2SAT % - - - 99.6 -       Pulmonary Assessment Scores:  Pulmonary Assessment Scores    Row Name 04/21/20 1628 09/09/20 1136       ADL UCSD   SOB Score total 68 -    Rest 0 -    Walk 3 -    Stairs 4 -    Bath 2 -    Dress 1 -    Shop 3 -         CAT Score   CAT Score 22 -         mMRC Score   mMRC Score 2 3           UCSD: Self-administered rating of dyspnea associated with activities of daily living (ADLs) 6-point  scale (0 = "not at all" to 5 = "maximal or unable to do because of breathlessness")  Scoring Scores range from 0 to 120.  Minimally important difference is 5 units  CAT: CAT can identify the health impairment of COPD patients and is better correlated with disease progression.  CAT has a scoring range of zero to 40. The CAT score is classified into four groups of low (less than 10), medium (10 - 20), high (21-30) and very high (31-40) based on the impact level of disease on health status. A CAT score over 10 suggests significant symptoms.  A worsening CAT score could be explained by an exacerbation, poor medication adherence, poor inhaler technique, or progression of COPD or comorbid conditions.  CAT MCID is 2 points  mMRC: mMRC (Modified Medical Research Council) Dyspnea Scale is used to assess the degree of baseline functional disability in patients of respiratory disease due to dyspnea. No minimal important difference is established. A decrease in score of 1 point or greater is considered a positive change.   Pulmonary Function Assessment:   Exercise Target Goals: Exercise Program Goal: Individual exercise prescription set using results from initial 6 min walk test and THRR while considering  patient's activity barriers and safety.   Exercise Prescription Goal: Initial exercise prescription builds to 30-45 minutes a day of aerobic activity, 2-3 days per week.  Home exercise guidelines will be given to patient during program as part of exercise prescription that the participant will acknowledge.  Education: Aerobic Exercise: - Group verbal and visual presentation on the components of exercise prescription. Introduces F.I.T.T principle from ACSM for exercise prescriptions.  Reviews F.I.T.T. principles of aerobic exercise including progression. Written material given at graduation. Flowsheet Row Cardiac Rehab from 09/04/2020 in [facility] Cardiac and Pulmonary Rehab  Education need identified  07/03/20  Date 09/04/20  Hilda Blades only on 10/28]  Educator AS  Instruction Review Code 1- Verbalizes Understanding      Education: Resistance Exercise: - Group verbal and visual presentation on the components of exercise prescription. Introduces F.I.T.T principle from ACSM for exercise prescriptions  Reviews F.I.T.T. principles of resistance exercise including progression. Written material given at graduation. Flowsheet Row Cardiac Rehab from 09/04/2020 in [facility] Cardiac and Pulmonary Rehab  Date 08/28/20  Educator [facility]  Instruction Review Code 1- Verbalizes Understanding  Education: Exercise & Equipment Safety: - Individual verbal instruction and demonstration of equipment use and safety with use of the equipment. Flowsheet Row Cardiac Rehab from 09/04/2020 in [facility] Cardiac and Pulmonary Rehab  Date 04/21/20  Educator AS  Instruction Review Code 1- Verbalizes Understanding      Education: Exercise Physiology & General Exercise Guidelines: - Group verbal and written instruction with models to review the exercise physiology of the cardiovascular system and associated critical values. Provides general exercise guidelines with specific guidelines to those with heart or lung disease.  Flowsheet Row Cardiac Rehab from 09/04/2020 in [facility] Cardiac and Pulmonary Rehab  Date 08/21/20  Educator [facility]  Instruction Review Code 1- Verbalizes Understanding      Education: Flexibility, Balance, Mind/Body Relaxation: - Group verbal and visual presentation with interactive activity on the components of exercise prescription. Introduces F.I.T.T principle from ACSM for exercise prescriptions. Reviews F.I.T.T. principles of flexibility and balance exercise training including progression. Also discusses the mind body connection.  Reviews various relaxation techniques to help reduce and manage stress (i.e. Deep breathing, progressive muscle relaxation, and visualization). Balance handout provided to  take home. Written material given at graduation. Flowsheet Row Cardiac Rehab from 09/04/2020 in [facility] Cardiac and Pulmonary Rehab  Date 07/10/20  Educator AS  Instruction Review Code 1- Verbalizes Understanding      Activity Barriers & Risk Stratification:   6 Minute Walk:  6 Minute Walk    Row Name 04/21/20 1609 09/09/20 1129       6 Minute Walk   Phase Initial Discharge    Distance 380 feet 465 feet    Distance % Change - 22 %    Distance Feet Change - 85 ft    Walk Time 4.5 minutes 4.5 minutes    # of Rest Breaks 3 1    MPH 0.95 1.2    METS 0.8 1.43    RPE 11 15    Perceived Dyspnea  2 2    VO2 Peak 2.75 5.01    Symptoms No No    Resting HR 85 bpm 82 bpm    Resting BP 120/60 122/64    Resting Oxygen Saturation  98 % 98 %    Exercise Oxygen Saturation  during 6 min walk 90 % 81 %    Max Ex. HR 103 bpm 132 bpm    Max Ex. BP 134/66 154/64    2 Minute Post BP 130/62 -         Interval HR   1 Minute HR 102 119    2 Minute HR 93 121    3 Minute HR 100 123    4 Minute HR 100 132    5 Minute HR 101 -    6 Minute HR 103 90    2 Minute Post HR 82 105    Interval Heart Rate? Yes Yes         Interval Oxygen   Interval Oxygen? Yes -    Baseline Oxygen Saturation % 98 % 98 %    1 Minute Oxygen Saturation % 92 % 88 %    1 Minute Liters of Oxygen 3 L 3 L    2 Minute Oxygen Saturation % 90 % 87 %    2 Minute Liters of Oxygen 3 L 3 L    3 Minute Oxygen Saturation % 94 % 84 %    3 Minute Liters of Oxygen 3 L 3 L    4 Minute Oxygen Saturation %  90 % 82 %    4 Minute Liters of Oxygen 3 L 3 L    5 Minute Oxygen Saturation % 93 % -    5 Minute Liters of Oxygen 3 L 3 L    6 Minute Oxygen Saturation % 93 % 81 %    6 Minute Liters of Oxygen 3 L 3 L    2 Minute Post Oxygen Saturation % 98 % 92 %    2 Minute Post Liters of Oxygen 3 L 3 L          Oxygen Initial Assessment:  Oxygen Initial Assessment - 05/06/20 1152      Home Oxygen   Home Oxygen Device Portable  Concentrator;Home Concentrator    Sleep Oxygen Prescription Continuous    Liters per minute 2    Home Exercise Oxygen Prescription None    Home Resting Oxygen Prescription Continuous    Liters per minute -1    Compliance with Home Oxygen Use Yes      Intervention   Short Term Goals To learn and exhibit compliance with exercise, home and travel O2 prescription;To learn and understand importance of monitoring SPO2 with pulse oximeter and demonstrate accurate use of the pulse oximeter.;To learn and understand importance of maintaining oxygen saturations>88%;To learn and demonstrate proper pursed lip breathing techniques or other breathing techniques.;To learn and demonstrate proper use of respiratory medications    Long  Term Goals Verbalizes importance of monitoring SPO2 with pulse oximeter and return demonstration;Exhibits proper breathing techniques, such as pursed lip breathing or other method taught during program session;Maintenance of O2 saturations>88%;Exhibits compliance with exercise, home and travel O2 prescription;Demonstrates proper use of MDI's;Compliance with respiratory medication           Oxygen Re-Evaluation:  Oxygen Re-Evaluation    [place] Name 05/06/20 1153 05/29/20 1137 06/17/20 1136 07/17/20 1116 08/14/20 1116     Program Oxygen Prescription   Program Oxygen Prescription - Continuous;E-Tanks Continuous;E-Tanks Continuous;E-Tanks Continuous   Liters per minute - 3 - 3 3     Home Oxygen   Home Oxygen Device - Home Concentrator;Portable Concentrator;E-Tanks Home Concentrator;Portable Concentrator;E-Tanks Home Concentrator;Portable Concentrator;E-Tanks Home Concentrator;E-Tanks   Sleep Oxygen Prescription - Continuous Continuous Continuous Continuous   Liters per minute - _0 Home Exercise Oxygen Prescription - Continuous Continuous Continuous Continuous   Liters per minute - _1 Home Resting Oxygen Prescription - Continuous Continuous Continuous Continuous    Liters per minute - _2 Compliance with Home Oxygen Use - Yes Yes Yes Yes     Goals/Expected Outcomes   Short Term Goals To learn and demonstrate proper pursed lip breathing techniques or other breathing techniques. To learn and exhibit compliance with exercise, home and travel O2 prescription;To learn and understand importance of monitoring SPO2 with pulse oximeter and demonstrate accurate use of the pulse oximeter.;To learn and understand importance of maintaining oxygen saturations>88%;To learn and demonstrate proper use of respiratory medications;To learn and demonstrate proper pursed lip breathing techniques or other breathing techniques. To learn and exhibit compliance with exercise, home and travel O2 prescription;To learn and understand importance of monitoring SPO2 with pulse oximeter and demonstrate accurate use of the pulse oximeter.;To learn and understand importance of maintaining oxygen saturations>88%;To learn and demonstrate proper use of respiratory medications;To learn and demonstrate proper pursed lip breathing techniques or other breathing techniques. To learn and exhibit compliance with exercise, home and travel O2 prescription;To learn and understand  importance of monitoring SPO2 with pulse oximeter and demonstrate accurate use of the pulse oximeter.;To learn and understand importance of maintaining oxygen saturations>88%;To learn and demonstrate proper use of respiratory medications;To learn and demonstrate proper pursed lip breathing techniques or other breathing techniques. To learn and understand importance of maintaining oxygen saturations>88%;To learn and exhibit compliance with exercise, home and travel O2 prescription   Long  Term Goals Exhibits proper breathing techniques, such as pursed lip breathing or other method taught during program session Exhibits compliance with exercise, home and travel O2 prescription;Exhibits proper breathing techniques, such as pursed lip  breathing or other method taught during program session;Verbalizes importance of monitoring SPO2 with pulse oximeter and return demonstration;Demonstrates proper use of MDI's;Maintenance of O2 saturations>88%;Compliance with respiratory medication Exhibits compliance with exercise, home and travel O2 prescription;Exhibits proper breathing techniques, such as pursed lip breathing or other method taught during program session;Verbalizes importance of monitoring SPO2 with pulse oximeter and return demonstration;Demonstrates proper use of MDI's;Maintenance of O2 saturations>88%;Compliance with respiratory medication Exhibits compliance with exercise, home and travel O2 prescription;Exhibits proper breathing techniques, such as pursed lip breathing or other method taught during program session;Verbalizes importance of monitoring SPO2 with pulse oximeter and return demonstration;Demonstrates proper use of MDI's;Maintenance of O2 saturations>88%;Compliance with respiratory medication Maintenance of O2 saturations>88%;Exhibits compliance with exercise, home and travel O2 prescription   Comments Reviewed PLB technique with pt.  Talked about how it works and it's importance in maintaining their exercise saturations. Informed patient how to perform the Pursed Lipped breathing technique. Told patient to Inhale through the nose and out the mouth with pursed lips to keep their airways open, help oxygenate them better, practice when at rest or doing strenuous activity. Patient Verbalizes understanding of technique and will work on and be reiterated during [place]. Pt uses PLB when she remembers.  Sh eis feeling like SOB has improved since starting LW program. Kenzie is doing well with her oxygen therapy.  She is using her PLB when she remembers.  She has noted that her breathing has been improving since starting the program.  She continues to do well on her respiratory meds too. Mary states that her oxygen tank is heavy  and she doesn not bring it to rehab due to it being heavy. She was informed to use it when in the wating room due to her oxygen being low before exercise. Informed her again that is it improtant to monitor her oxygen and use it when she is out of the house. Informed her to check her oxygen when she if off oxygen running errends and to make sure she is 88 percent and above.   Goals/Expected Outcomes Short: Become more profiecient at using PLB.   Long: Become independent at using PLB. Short: use PLB with exertion. Long: use PLB on exertion proficiently and independently Short: continue to be compliant with O2 and PLB Long: use PLB as needed Short: continue to be compliant with O2 and PLB Long: use PLB as needed Short: use oxygen when she runs errends. Long: Maintain using oxygen when in stores and running errends.          Oxygen Discharge (Final Oxygen Re-Evaluation):  Oxygen Re-Evaluation - 08/14/20 1116      Program Oxygen Prescription   Program Oxygen Prescription Continuous    Liters per minute 3      Home Oxygen   Home Oxygen Device Home Concentrator;E-Tanks    Sleep Oxygen Prescription Continuous    Liters per minute 3  Home Exercise Oxygen Prescription Continuous    Liters per minute 3    Home Resting Oxygen Prescription Continuous    Liters per minute 3    Compliance with Home Oxygen Use Yes      Goals/Expected Outcomes   Short Term Goals To learn and understand importance of maintaining oxygen saturations>88%;To learn and exhibit compliance with exercise, home and travel O2 prescription    Long  Term Goals Maintenance of O2 saturations>88%;Exhibits compliance with exercise, home and travel O2 prescription    Comments Sherly states that her oxygen tank is heavy and she doesn not bring it to rehab due to it being heavy. She was informed to use it when in the wating room due to her oxygen being low before exercise. Informed her again that is it improtant to monitor her oxygen and  use it when she is out of the house. Informed her to check her oxygen when she if off oxygen running errends and to make sure she is 88 percent and above.    Goals/Expected Outcomes Short: use oxygen when she runs errends. Long: Maintain using oxygen when in stores and running errends.           Initial Exercise Prescription:  Initial Exercise Prescription - 04/21/20 1600      Date of Initial Exercise RX and Referring Provider   Date 04/21/20    Referring Provider [name]      Treadmill   MPH 0.8    Grade 0    Minutes 15    METs 1      Recumbant Bike   Level 1    RPM 60    Watts 5    Minutes 15    METs 1      NuStep   Level 1    SPM 80    Minutes 15    METs 1      Arm Ergometer   Level 1    RPM 25    Minutes 15    METs 1      Recumbant Elliptical   Level 1    RPM 30    Minutes 15    METs 1      Prescription Details   Frequency (times per week) 3    Duration Progress to 30 minutes of continuous aerobic without signs/symptoms of physical distress      Intensity   THRR 40-80% of Max Heartrate 107-129    Ratings of Perceived Exertion 11-13    Perceived Dyspnea 0-4      Resistance Training   Training Prescription Yes    Weight 2 lb    Reps 10-15           Perform Capillary Blood Glucose checks as needed.  Exercise Prescription Changes:  Exercise Prescription Changes    Row Name 04/21/20 1600 05/06/20 1500 05/21/20 1000 06/03/20 0900 07/03/20 1100     Response to Exercise   Blood Pressure (Admit) 120/60 124/64 122/60 142/74 112/60   Blood Pressure (Exercise) 134/66 128/70 146/64 146/70 126/64   Blood Pressure (Exit) 130/62 122/62 138/70 100/58 128/64   Heart Rate (Admit) 85 bpm 97 bpm 84 bpm 73 bpm 86 bpm   Heart Rate (Exercise) 103 bpm 100 bpm 114 bpm 108 bpm 109 bpm   Heart Rate (Exit) 82 bpm 90 bpm 95 bpm 82 bpm 87 bpm   Oxygen Saturation (Admit) 98 % 89 % 99 % 100 % 93 %   Oxygen Saturation (Exercise) 90 % 96 %  92 % 92 % 92 %   Oxygen  Saturation (Exit) 98 % 99 % 98 % 95 % 94 %   Rating of Perceived Exertion (Exercise) _0 Perceived Dyspnea (Exercise) _1 Symptoms _2    Comments - first day - - -   Duration - Progress to 30 minutes of  aerobic without signs/symptoms of physical distress Progress to 30 minutes of  aerobic without signs/symptoms of physical distress Progress to 30 minutes of  aerobic without signs/symptoms of physical distress Progress to 30 minutes of  aerobic without signs/symptoms of physical distress   Intensity - - THRR unchanged THRR unchanged THRR unchanged     Progression   Progression - Continue to progress workloads to maintain intensity without signs/symptoms of physical distress. Continue to progress workloads to maintain intensity without signs/symptoms of physical distress. Continue to progress workloads to maintain intensity without signs/symptoms of physical distress. Continue to progress workloads to maintain intensity without signs/symptoms of physical distress.   Average METs - 2 2.19 2.32 2.2     Resistance Training   Training Prescription - Yes Yes Yes Yes   Weight - 2 lb 2 lb 2 lb 3 lb   Reps - 10-15 10-15 10-15 10-15     Interval Training   Interval Training - No No No No     Recumbant Bike   Level - _3 -   RPM - 60 - - -   Watts - _4 -   Minutes - _5 -   METs - 1 2.38 - -     NuStep   Level - - - 1 2   SPM - - - 80 80   Minutes - - - 15 15   METs - - - 2.3 2.3     Arm Ergometer   Level - _6 -   Minutes - _7 -   METs - 2 1.9 - -     REL-XR   Level - - - 1 -   Minutes - - - 15 -   METs - - - 3.18 -     Biostep-RELP   Level - - - - 2   Minutes - - - - 15   METs - - - - 2   Row Name 07/17/20 0700 07/30/20 1200 08/13/20 1000 08/26/20 1000 09/09/20 1600     Response to Exercise   Blood Pressure (Admit) 112/60 102/60 136/64 128/62 124/68   Blood Pressure (Exercise) 134/70 134/58 130/66 140/60 136/70    Blood Pressure (Exit) 122/62 108/60 134/70 116/70 118/68   Heart Rate (Admit) 78 bpm 82 bpm 88 bpm 75 bpm 76 bpm   Heart Rate (Exercise) 100 bpm 102 bpm 97 bpm 107 bpm 90 bpm   Heart Rate (Exit) 90 bpm 89 bpm 91 bpm 93 bpm 90 bpm   Oxygen Saturation (Admit) 98 % 99 % 88 % 99 % 90 %   Oxygen Saturation (Exercise) 91 % 93 % 90 % 88 % 91 %   Oxygen Saturation (Exit) 97 % 99 % 99 % 94 % 98 %   Rating of Perceived Exertion (Exercise) _8 Perceived Dyspnea (Exercise) 0 0 _9 Symptoms _10    Duration Continue with 30 min of aerobic exercise without signs/symptoms of physical distress. Continue with  30 min of aerobic exercise without signs/symptoms of physical distress. Continue with 30 min of aerobic exercise without signs/symptoms of physical distress. Continue with 30 min of aerobic exercise without signs/symptoms of physical distress. Continue with 30 min of aerobic exercise without signs/symptoms of physical distress.   Intensity _0      Progression   Progression Continue to progress workloads to maintain intensity without signs/symptoms of physical distress. Continue to progress workloads to maintain intensity without signs/symptoms of physical distress. Continue to progress workloads to maintain intensity without signs/symptoms of physical distress. Continue to progress workloads to maintain intensity without signs/symptoms of physical distress. Continue to progress workloads to maintain intensity without signs/symptoms of physical distress.   Average METs 2.2 2 2.25 2 1.88     Resistance Training   Training Prescription _1    Weight 3 lb 3 lb 3 lb 3 lb 3 lb   Reps 10-15 10-15 10-15 10-15 10-15     Interval Training   Interval Training _2      Treadmill   MPH - - - - 0.8   Grade - - - - 0   Minutes - - - - 15   METs - - - - 1.6     NuStep   Level _3 SPM - 80 - 80 -   Minutes _4 METs 2.6 2 2.5 2 1.9     T5 Nustep   Level 2 - - - 1   Minutes 30 - - - 15   METs 2 - - - 2     Biostep-RELP   Level _5 SPM - - - 50 -   Minutes _6 METs _7 Home Exercise Plan   Plans to continue exercise at Home (comment)  bike and weights Home (comment)  bike and weights Home (comment)  bike and weights Home (comment)  bike and weights Home (comment)  bike and weights   Frequency Add 2 additional days to program exercise sessions. Add 2 additional days to program exercise sessions. Add 2 additional days to program exercise sessions. Add 2 additional days to program exercise sessions. Add 2 additional days to program exercise sessions.   Initial Home Exercises Provided 06/17/20 06/17/20 06/17/20 06/17/20 06/17/20          Exercise Comments:  Exercise Comments    Row Name 05/06/20 1152           Exercise Comments First full day of exercise!  Patient was oriented to gym and equipment including functions, settings, policies, and procedures.  Patient's individual exercise prescription and treatment plan were reviewed.  All starting workloads were established based on the results of the 6 minute walk test done at initial orientation visit.  The plan for exercise progression was also introduced and progression will be customized based on patient's performance and goals.              Exercise Goals and Review:  Exercise Goals    Row Name 04/21/20 1617             Exercise Goals   Increase Physical Activity Yes       Intervention Provide advice, education, support and counseling about physical activity/exercise needs.;Develop an individualized exercise prescription for aerobic  and resistive training based on initial evaluation findings, risk stratification, comorbidities and participant's personal goals.       Expected Outcomes Short Term: Attend rehab on a regular basis to increase amount of  physical activity.;Long Term: Add in home exercise to make exercise part of routine and to increase amount of physical activity.;Long Term: Exercising regularly at least 3-5 days a week.       Increase Strength and Stamina Yes       Intervention Provide advice, education, support and counseling about physical activity/exercise needs.;Develop an individualized exercise prescription for aerobic and resistive training based on initial evaluation findings, risk stratification, comorbidities and participant's personal goals.       Expected Outcomes Short Term: Increase workloads from initial exercise prescription for resistance, speed, and METs.;Short Term: Perform resistance training exercises routinely during rehab and add in resistance training at home;Long Term: Improve cardiorespiratory fitness, muscular endurance and strength as measured by increased METs and functional capacity (6MWT)       Able to understand and use rate of perceived exertion (RPE) scale Yes       Intervention Provide education and explanation on how to use RPE scale       Expected Outcomes Short Term: Able to use RPE daily in rehab to express subjective intensity level;Long Term:  Able to use RPE to guide intensity level when exercising independently       Able to understand and use Dyspnea scale Yes       Intervention Provide education and explanation on how to use Dyspnea scale       Expected Outcomes Short Term: Able to use Dyspnea scale daily in rehab to express subjective sense of shortness of breath during exertion;Long Term: Able to use Dyspnea scale to guide intensity level when exercising independently       Knowledge and understanding of Target Heart Rate Range (THRR) Yes       Intervention Provide education and explanation of THRR including how the numbers were predicted and where they are located for reference       Expected Outcomes Short Term: Able to state/look up THRR;Short Term: Able to use daily as guideline for  intensity in rehab;Long Term: Able to use THRR to govern intensity when exercising independently       Able to check pulse independently Yes       Intervention Provide education and demonstration on how to check pulse in carotid and radial arteries.;Review the importance of being able to check your own pulse for safety during independent exercise       Expected Outcomes Short Term: Able to explain why pulse checking is important during independent exercise;Long Term: Able to check pulse independently and accurately       Understanding of Exercise Prescription Yes       Intervention Provide education, explanation, and written materials on patient's individual exercise prescription       Expected Outcomes Short Term: Able to explain program exercise prescription;Long Term: Able to explain home exercise prescription to exercise independently              Exercise Goals Re-Evaluation :  Exercise Goals Re-Evaluation    Row Name 05/06/20 1154 05/21/20 1024 05/29/20 1143 06/03/20 0918 06/17/20 1131     Exercise Goal Re-Evaluation   Exercise Goals Review Able to understand and use rate of perceived exertion (RPE) scale;Able to understand and use Dyspnea scale;Knowledge and understanding of Target Heart Rate Range (THRR);Understanding of Exercise Prescription Increase Physical Activity;Increase  Strength and Stamina;Understanding of Exercise Prescription Increase Physical Activity Increase Physical Activity;Understanding of Exercise Prescription;Increase Strength and Stamina Increase Physical Activity;Understanding of Exercise Prescription;Increase Strength and Stamina;Able to check pulse independently;Knowledge and understanding of Target Heart Rate Range (THRR);Able to understand and use Dyspnea scale;Able to understand and use rate of perceived exertion (RPE) scale   Comments Reviewed RPE and dyspnea scales, THR and program prescription with pt today.  Pt voiced understanding and was given a copy of goals  to take home. Lorelle is off to a good start in rehab.  She has completed her first two full days of exercise. We will continue to monitor her progress. When Frida is not coming to rehab she is using her pedal bike that sits on the floor. She also uses it for her arms when she puts it on the kitchen counter. She exercises for about 20 minutes at a time. Italia is doing well in rehab. She has tried a variety of different machines. She is up to 15 watts on the [employer] Bike. Will continue to monitor her progress. Reviewed home exercise with pt today.  Pt plans to use equipment at home for exercise.  Reviewed THR, pulse, RPE, sign and symptoms, pulse oximetery and when to call 911 or MD.  Also discussed weather considerations and indoor options.  Pt voiced understanding.   Expected Outcomes Short: Use RPE daily to regulate intensity. Long: Follow program prescription in THR. Short: Return to regular attendance Long; Continue to follow program prescription Short: continue to increase workout activity at home. Long: maintain home exercises independently Short: Continue to increase workloads Long: Continue to increase strength/stamina Short: monitor HR and O2 when exercising at home Long: maintain exercise after completing [place] Name 06/19/20 0723 07/03/20 1150 07/17/20 0728 07/17/20 1106 07/30/20 1225     Exercise Goal Re-Evaluation   Exercise Goals Review Increase Physical Activity;Increase Strength and Stamina;Understanding of Exercise Prescription Increase Physical Activity;Increase Strength and Stamina Increase Physical Activity;Increase Strength and Stamina;Understanding of Exercise Prescription Increase Physical Activity;Increase Strength and Stamina;Understanding of Exercise Prescription Increase Physical Activity;Increase Strength and Stamina;Understanding of Exercise Prescription   Comments Tanice is doing well in rehab.  She is still on level 1, but she is now up to 2 METs.  We will continue to  encourage her to increase workloads and continue to monitor her progress. Anarie has moved up to level 2 and 3 lb for strength work.  Staff will monitor progress. Prescious is doing well in rehab.  She is now on level 3 for the NuStep.  We will continue to montior her progress. Latesa is doing well.  She is feeling stronger and has more stamina.  She has been using a simple stepper from her daughter for both her arms and legs.  She also does her weights as well.  She tries to get in some exercise everyday except Sunday. Charelle attends consistently and works at [employer].  Oxygen levels stay in 90s most of the time.  Staff will monitor progress.   Expected Outcomes Short: Increase to level 2  Long; COnitnue to improve stamina. Short: continue to increase workloads when able Long: increase overall stamina Short: Continue to increase workloads.  Long: Continue to improve stamina. Short: Continue to exercise on off days Long: Continue to improve stamina. Short: contineu to exercise consistently Long: improve average MET level   Row Name 08/13/20 1010 08/26/20 1009 09/09/20 1627         Exercise Goal Re-Evaluation  Exercise Goals Review Increase Physical Activity;Increase Strength and Stamina;Understanding of Exercise Prescription Increase Physical Activity;Increase Strength and Stamina Increase Physical Activity;Increase Strength and Stamina;Understanding of Exercise Prescription     Comments Yaniyah is doing well in rehab.  She is now on level 5 for the NuStep.  We will continue to monitor her progress. Cherry continues to tolerate exercise well. She maintain oxygen 88% and above on 3L during exercise. Sheba improved her post 6MWT by 22%!!  She was pleased with her progress.  We will continue to monitor her progress.     Expected Outcomes Short: Increase BioStep to level 3 Long; Continue to improve stamina. Short:  increase levels on machines Long: improve overall stamina Short: Plan for graduation Long:  Continue to exercise independently            Discharge Exercise Prescription (Final Exercise Prescription Changes):  Exercise Prescription Changes - 09/09/20 1600      Response to Exercise   Blood Pressure (Admit) 124/68    Blood Pressure (Exercise) 136/70    Blood Pressure (Exit) 118/68    Heart Rate (Admit) 76 bpm    Heart Rate (Exercise) 90 bpm    Heart Rate (Exit) 90 bpm    Oxygen Saturation (Admit) 90 %    Oxygen Saturation (Exercise) 91 %    Oxygen Saturation (Exit) 98 %    Rating of Perceived Exertion (Exercise) 13    Perceived Dyspnea (Exercise) 1    Symptoms none    Duration Continue with 30 min of aerobic exercise without signs/symptoms of physical distress.    Intensity THRR unchanged      Progression   Progression Continue to progress workloads to maintain intensity without signs/symptoms of physical distress.    Average METs 1.88      Resistance Training   Training Prescription Yes    Weight 3 lb    Reps 10-15      Interval Training   Interval Training No      Treadmill   MPH 0.8    Grade 0    Minutes 15    METs 1.6      NuStep   Level 5    Minutes 15    METs 1.9      T5 Nustep   Level 1    Minutes 15    METs 2      Biostep-RELP   Level 3    Minutes 15    METs 2      Home Exercise Plan   Plans to continue exercise at Home (comment)   bike and weights   Frequency Add 2 additional days to program exercise sessions.    Initial Home Exercises Provided 06/17/20           Nutrition:  Target Goals: Understanding of nutrition guidelines, daily intake of sodium <1568m, cholesterol <2030m calories 30% from fat and 7% or less from saturated fats, daily to have 5 or more servings of fruits and vegetables.  Education: All About Nutrition: -Group instruction provided by verbal, written material, interactive activities, discussions, models, and posters to present general guidelines for heart healthy nutrition including fat, fiber, MyPlate, the  role of sodium in heart healthy nutrition, utilization of the nutrition label, and utilization of this knowledge for meal planning. Follow up email sent as well. Written material given at graduation. [facility]  Date 07/17/20  [facility] Code 1- Verbalizes Understanding  Biometrics:  Pre Biometrics - 04/21/20 1621      Pre Biometrics   Height 5' 2.75" (1.594 m)    Weight 146 lb 11.2 oz (66.5 kg)   self reported   BMI (Calculated) 26.19            Nutrition Therapy Plan and Nutrition Goals:  Nutrition Therapy & Goals - 06/17/20 1025      Nutrition Therapy   Diet Heart healthy, low Na, pulmonary MNT.    Protein (specify units) 80g    Fiber 25 grams    Whole Grain Foods 3 servings    Saturated Fats 12 max. grams    Fruits and Vegetables 5 servings/day    Sodium 1.5 grams      Personal Nutrition Goals   Nutrition Goal ST: Add vegetable servings LT: 6/7 breathing now, would like to improve SOB and lower O2 - 3L now. Would like to maintain weight.    Comments Panagiota likes to eat seafood, pork and chicken. She normally doesn't cook, they go out to eat. [employer], [employer], olive garden, [place] roots, cracker barrel, scrambled [place]).  2 meals per day- goes out for both meals. 11-12pm brunch. 5-6 dinner. Doesn't eat much eggs or dairy B/L: grits, sausage or turley sausage or biscuits or [place] sandwich or soup or sometimes salad. D: fish, oysters, shrimp, hamburger steak, pork chops, chicken [place] - usually baked. Paired with rice, baked potato, sweet potato, brussels sprouts, cabbage, greens, salad, coleslaw. Drinks: water, unsweetened iced tea with lemon, diet pepsi or coke with lemon and coffee. Discussed heart healthy eating, pulmonary MNT. Jalei does not want to eat at home, but her daughter [name] would like to move to doing that.      Intervention Plan   Intervention  Prescribe, educate and counsel regarding individualized specific dietary modifications aiming towards targeted core components such as weight, hypertension, lipid management, diabetes, heart failure and other comorbidities.;Nutrition handout(s) given to patient.    Expected Outcomes Short Term Goal: Understand basic principles of dietary content, such as calories, fat, sodium, cholesterol and nutrients.;Short Term Goal: A plan has been developed with personal nutrition goals set during dietitian appointment.;Long Term Goal: Adherence to prescribed nutrition plan.           Nutrition Assessments:  Nutrition Assessments - 04/21/20 1633      MEDFICTS Scores   Pre Score 18          MEDIFICTS Score Key:  ?70 Need to make dietary changes   40-70 Heart Healthy Diet  ? 40 Therapeutic Level Cholesterol Diet   Picture Your Plate Scores:  <40 Unhealthy dietary pattern with much room for improvement.  41-50 Dietary pattern unlikely to meet recommendations for good health and room for improvement.  51-60 More healthful dietary pattern, with some room for improvement.   >60 Healthy dietary pattern, although there may be some specific behaviors that could be improved.   Nutrition Goals Re-Evaluation:  Nutrition Goals Re-Evaluation    [place] Name 07/17/20 1112 08/14/20 1123           Goals   Current Weight - 146 lb (66.2 kg)      Nutrition Goal Add in vegetables and maintain weight Maintain weight. Make healthier eating choices.      Comment Stepanie is doing well with her diet.  She has tried to add in vegetables but not sure how well she is really doing with it.  Her daughter doesn't like to cook and she does not  want to cook any more.  They eat out a lot and is limited in her selection. We talked about ordering salads.  Her favorite is cole slaw and brussell sprouts.  We talked about trying the steamables and saving half for another day. Alexandrea has no questions about her diet and tries  to maintain her weight.      Expected Outcome Short: Try frozen brussel sprouts and ordering salads  Long; Continue to add in more vegetables. Short: Continue to make healthy eating choices. Long: maintain weight independently             Nutrition Goals Discharge (Final Nutrition Goals Re-Evaluation):  Nutrition Goals Re-Evaluation - 08/14/20 1123      Goals   Current Weight 146 lb (66.2 kg)    Nutrition Goal Maintain weight. Make healthier eating choices.    Comment Jadia has no questions about her diet and tries to maintain her weight.    Expected Outcome Short: Continue to make healthy eating choices. Long: maintain weight independently           Psychosocial: Target Goals: Acknowledge presence or absence of significant depression and/or stress, maximize coping skills, provide positive support system. Participant is able to verbalize types and ability to use techniques and skills needed for reducing stress and depression.   Education: Stress, Anxiety, and Depression - Group verbal and visual presentation to define topics covered.  Reviews how body is impacted by stress, anxiety, and depression.  Also discusses healthy ways to reduce stress and to treat/manage anxiety and depression.  Written material given at graduation. Flowsheet Row Cardiac Rehab from 09/04/2020 in [facility] Cardiac and Pulmonary Rehab  Date 08/14/20  Educator SB  Instruction Review Code 1- [employer] Understanding      Education: Sleep Hygiene -Provides group verbal and written instruction about how sleep can affect your health.  Define sleep hygiene, discuss sleep cycles and impact of sleep habits. Review good sleep hygiene tips.    Initial Review & Psychosocial Screening:   Quality of Life Scores:  Scores of 19 and below usually indicate a poorer quality of life in these areas.  A difference of  2-3 points is a clinically meaningful difference.  A difference of 2-3 points in the total score of the  Quality of Life Index has been associated with significant improvement in overall quality of life, self-image, physical symptoms, and general health in studies assessing change in quality of life.  PHQ-9: Recent Review Flowsheet Data    Depression screen [facility] 2/9 04/21/2020 10/04/2019 10/04/2019 06/14/2019 03/29/2018   Decreased Interest 0 0 0 0 0   Down, Depressed, Hopeless 0 0 0 0 0   PHQ - 2 Score 0 0 0 0 0   Altered sleeping 0 0 - - -   Tired, decreased energy 0 0 - - -   Change in appetite 0 0 - - -   Feeling bad or failure about yourself  0 0 - - -   Trouble concentrating 0 0 - - -   Moving slowly or fidgety/restless 0 0 - - -   Suicidal thoughts 0 0 - - -   PHQ-9 Score 0 0 - - -   Difficult doing work/chores Not difficult at all Not difficult at all - - -     Interpretation of Total Score  Total Score Depression Severity:  1-4 = Minimal depression, 5-9 = Mild depression, 10-14 = Moderate depression, 15-19 = Moderately severe depression, 20-27 = Severe depression  Psychosocial Evaluation and Intervention:   Psychosocial Re-Evaluation:  Psychosocial Re-Evaluation    [place] Name 05/29/20 1145 06/17/20 1135 07/17/20 1109 08/14/20 1122       Psychosocial Re-Evaluation   Current issues with None Identified - Current Stress Concerns None Identified    Comments Patient reports no issues with their current mental states, sleep, stress, depression or anxiety. Will follow up with patient in a few weeks for any changes. No chaneg or concerns with sleep, stress, etc. Jenavive is doing well in rehab. She denies any major stressors other than not always able to do what she wants because of her breathing.  She sleeps well, no problems. Patient reports no issues with their current mental states, sleep, stress, depression or anxiety. Will follow up with patient in a few weeks for any changes.    Expected Outcomes Short: Continue to exercise regularly to support mental health and notify staff of any  changes. Long: maintain mental health and well being through teaching of rehab or prescribed medications independently. Short: Continue to exercise regularly to support mental health and notify staff of any changes. Long: maintain mental health and well being through teaching of rehab or prescribed medications independently. short: Continue to attend rehab to build breathing and stamina Long: Continue to stay positive Short: Continue to exercise regularly to support mental health and notify staff of any changes. Long: maintain mental health and well being through teaching of rehab or prescribed medications independently.    Interventions Encouraged to attend Pulmonary Rehabilitation for the exercise - Encouraged to attend Pulmonary Rehabilitation for the exercise Encouraged to attend Pulmonary Rehabilitation for the exercise    Continue Psychosocial Services  Follow up required by staff - - Follow up required by staff           Psychosocial Discharge (Final Psychosocial Re-Evaluation):  Psychosocial Re-Evaluation - 08/14/20 1122      Psychosocial Re-Evaluation   Current issues with None Identified    Comments Patient reports no issues with their current mental states, sleep, stress, depression or anxiety. Will follow up with patient in a few weeks for any changes.    Expected Outcomes Short: Continue to exercise regularly to support mental health and notify staff of any changes. Long: maintain mental health and well being through teaching of rehab or prescribed medications independently.    Interventions Encouraged to attend Pulmonary Rehabilitation for the exercise    Continue Psychosocial Services  Follow up required by staff           Education: Education Goals: Education classes will be provided on a weekly basis, covering required topics. Participant will state understanding/return demonstration of topics presented.  Learning Barriers/Preferences:   General Pulmonary Education  Topics:  Infection Prevention: - Provides verbal and written material to individual with discussion of infection control including proper hand washing and proper equipment cleaning during exercise session. Flowsheet Row Cardiac Rehab from 09/04/2020 in [facility] Cardiac and Pulmonary Rehab  Date 04/21/20  Educator AS  Instruction Review Code 1- Verbalizes Understanding      Falls Prevention: - Provides verbal and written material to individual with discussion of falls prevention and safety. Flowsheet Row Cardiac Rehab from 09/04/2020 in [facility] Cardiac and Pulmonary Rehab  Date 04/21/20  Educator AS  Instruction Review Code 1- Verbalizes Understanding      Chronic Lung Disease Review: - Group verbal instruction with posters, models, PowerPoint presentations and videos,  to review new updates, new respiratory medications, new advancements in procedures and treatments. Providing  information on websites and "800" numbers for continued self-education. Includes information about supplement oxygen, available portable oxygen systems, continuous and intermittent flow rates, oxygen safety, concentrators, and Medicare reimbursement for oxygen. Explanation of Pulmonary Drugs, including class, frequency, complications, importance of spacers, rinsing mouth after steroid MDI's, and proper cleaning methods for nebulizers. Review of basic lung anatomy and physiology related to function, structure, and complications of lung disease. Review of risk factors. Discussion about methods for diagnosing sleep apnea and types of masks and machines for OSA. Includes a review of the use of types of environmental controls: home humidity, furnaces, filters, dust mite/pet prevention, HEPA vacuums. Discussion about weather changes, air quality and the benefits of nasal washing. Instruction on Warning signs, infection symptoms, calling MD promptly, preventive modes, and value of vaccinations. Review of effective airway clearance,  coughing and/or vibration techniques. Emphasizing that all should Create an Action Plan. Written material given at graduation. Flowsheet Row Cardiac Rehab from 09/04/2020 in [facility] Cardiac and Pulmonary Rehab  Date 08/07/20  Educator [facility]  Instruction Review Code 1- Verbalizes Understanding      AED/CPR: - Group verbal and written instruction with the use of models to demonstrate the basic use of the AED with the basic ABC's of resuscitation.    Anatomy and Cardiac Procedures: - Group verbal and visual presentation and models provide information about basic cardiac anatomy and function. Reviews the testing methods done to diagnose heart disease and the outcomes of the test results. Describes the treatment choices: Medical Management, Angioplasty, or Coronary Bypass Surgery for treating various heart conditions including Myocardial Infarction, Angina, Valve Disease, and Cardiac Arrhythmias.  Written material given at graduation. Flowsheet Row Cardiac Rehab from 09/04/2020 in [facility] Cardiac and Pulmonary Rehab  Date 08/28/20  Educator [facility]  Instruction Review Code 1- Verbalizes Understanding      Medication Safety: - Group verbal and visual instruction to review commonly prescribed medications for heart and lung disease. Reviews the medication, class of the drug, and side effects. Includes the steps to properly store meds and maintain the prescription regimen.  Written material given at graduation. Flowsheet Row Cardiac Rehab from 09/04/2020 in [facility] Cardiac and Pulmonary Rehab  Date 07/24/20  Educator [facility] [facility]  Instruction Review Code 1- Verbalizes Understanding      Other: -Provides group and verbal instruction on various topics (see comments)   Knowledge Questionnaire Score:  Knowledge Questionnaire Score - 04/21/20 1630      Knowledge Questionnaire Score   Pre Score 9/17            Core Components/Risk Factors/Patient Goals at Admission:  Personal Goals and Risk Factors at Admission  - 04/21/20 1623      Core Components/Risk Factors/Patient Goals on Admission    Weight Management Yes    Intervention Weight Management: Develop a combined nutrition and exercise program designed to reach desired caloric intake, while maintaining appropriate intake of nutrient and fiber, sodium and fats, and appropriate energy expenditure required for the weight goal.;Weight Management: Provide education and appropriate resources to help participant work on and attain dietary goals.    Admit Weight 146 lb 11.2 oz (66.5 kg)    Goal Weight: Short Term 146 lb 11.2 oz (66.5 kg)    Goal Weight: Long Term 146 lb 11.2 oz (66.5 kg)    Expected Outcomes Short Term: Continue to assess and modify interventions until short term weight is achieved;Long Term: Adherence to nutrition and physical activity/exercise program aimed toward attainment of established weight goal;Weight Maintenance: Understanding of  the daily nutrition guidelines, which includes 25-35% calories from fat, 7% or less cal from saturated fats, less than 249m cholesterol, less than 1.5gm of sodium, & 5 or more servings of fruits and vegetables daily           Education:Diabetes - Individual verbal and written instruction to review signs/symptoms of diabetes, desired ranges of glucose level fasting, after meals and with exercise. Acknowledge that pre and post exercise glucose checks will be done for 3 sessions at entry of program.   Know Your Numbers and Heart Failure: - Group verbal and visual instruction to discuss disease risk factors for cardiac and pulmonary disease and treatment options.  Reviews associated critical values for Overweight/Obesity, Hypertension, Cholesterol, and Diabetes.  Discusses basics of heart failure: signs/symptoms and treatments.  Introduces Heart Failure Zone chart for action plan for heart failure.  Written material given at graduation.   Core Components/Risk Factors/Patient Goals Review:   Goals and Risk  Factor Review    Row Name 05/29/20 1141 06/17/20 1133 07/17/20 1111 08/14/20 1121       Core Components/Risk Factors/Patient Goals Review   Personal Goals Review Weight Management/Obesity;Improve shortness of breath with ADL's Weight Management/Obesity;Improve shortness of breath with ADL's Weight Management/Obesity;Improve shortness of breath with ADL's Improve shortness of breath with ADL's    Review Spoke to patient about their shortness of breath and what they can do to improve. Patient has been informed of breathing techniques when starting the program. Patient is informed to tell staff if they have had any med changes and that certain meds they are taking or not taking can be causing shortness of breath. BKameelahis taking meds as directed.  She feels her shortness of breath has improved.  She can walk further without getting out of breath.  She uses PLB when she remembers. BTeyahis doing well in rehab. Her weight is pretty steady between 145-150 lb.  She does not like it when it is up to 150 lb.  Her breathing is still limiting her ability to do things but it has improved since starting rehab.  She continues to use the PLB at home when she remembers. Spoke to patient about their shortness of breath and what they can do to improve. Patient has been informed of breathing techniques when starting the program. Patient is informed to tell staff if they have had any med changes and that certain meds they are taking or not taking can be causing shortness of breath.    Expected Outcomes Short: Attend [name] regularly to improve shortness of breath with ADL's. Long: maintain independence with ADL's Short:  continue to exercise Long: continue to improve SOB with ADLs Short: Continue to maintain weight  Long; Continue to improve SOB Short: Attend [name] regularly to improve shortness of breath with ADL's. Long: maintain independence with ADL's           Core Components/Risk Factors/Patient Goals at  Discharge (Final Review):   Goals and Risk Factor Review - 08/14/20 1121      Core Components/Risk Factors/Patient Goals Review   Personal Goals Review Improve shortness of breath with ADL's    Review Spoke to patient about their shortness of breath and what they can do to improve. Patient has been informed of breathing techniques when starting the program. Patient is informed to tell staff if they have had any med changes and that certain meds they are taking or not taking can be causing shortness of breath.    Expected Outcomes  Short: Attend [name] regularly to improve shortness of breath with ADL's. Long: maintain independence with ADL's           ITP Comments:  ITP Comments    Row Name 04/21/20 1639 05/06/20 1152 05/14/20 1606 06/11/20 0557 07/09/20 0656   ITP Comments Completed 6MWT and gym orientation. Initial ITP created and sent for review to Dr. Emily Filbert, Medical Director. First full day of exercise!  Patient was oriented to gym and equipment including functions, settings, policies, and procedures.  Patient's individual exercise prescription and treatment plan were reviewed.  All starting workloads were established based on the results of the 6 minute walk test done at initial orientation visit.  The plan for exercise progression was also introduced and progression will be customized based on patient's performance and goals. 30 day review completed. ITP sent to Dr. Emily Filbert, Medical Director of Cardiac and Pulmonary Rehab. Continue with ITP unless changes are made by physician. 30 Day review completed. Medical Director ITP review done, changes made as directed, and signed approval by Medical Director. 30 Day review completed. Medical Director ITP review done, changes made as directed, and signed approval by Medical Director.   [place] Name 08/06/20 1020 09/03/20 0604 09/29/20 1306 09/30/20 1215 10/01/20 0926   ITP Comments 30 Day review completed. Medical Director ITP review done,  changes made as directed, and signed approval by Medical Director. 30 Day review completed. Medical Director ITP review done, changes made as directed, and signed approval by Medical Director. Dr. Clance Boll faxed correspondence stating that [name] may return to  cardiac/pulm rehab on 09/30/20. She is to continue wearing her brace and not to use the left hand/no weight lifting. Contact office if any questions [phone] Farrell has been out due to ganglion cyst removal, she has been cleared to come back 1/25 with limitations. Unable to get goals this round. 30 Day review completed. Medical Director ITP review done, changes made as directed, and signed approval by Medical Director.          Comments:

## 2020-10-02 ENCOUNTER — Other Ambulatory Visit: Payer: Self-pay | Admitting: Nurse Practitioner

## 2020-10-02 ENCOUNTER — Ambulatory Visit: Payer: Medicare Other

## 2020-10-02 DIAGNOSIS — E119 Type 2 diabetes mellitus without complications: Secondary | ICD-10-CM

## 2020-10-03 ENCOUNTER — Other Ambulatory Visit: Payer: Self-pay | Admitting: Internal Medicine

## 2020-10-06 ENCOUNTER — Encounter: Payer: Self-pay | Admitting: *Deleted

## 2020-10-06 DIAGNOSIS — J439 Emphysema, unspecified: Secondary | ICD-10-CM

## 2020-10-07 ENCOUNTER — Ambulatory Visit: Payer: Medicare Other

## 2020-10-09 ENCOUNTER — Ambulatory Visit (INDEPENDENT_AMBULATORY_CARE_PROVIDER_SITE_OTHER): Payer: Medicare Other | Admitting: Nurse Practitioner

## 2020-10-09 ENCOUNTER — Encounter: Payer: Self-pay | Admitting: Nurse Practitioner

## 2020-10-09 ENCOUNTER — Ambulatory Visit: Payer: Medicare Other

## 2020-10-09 ENCOUNTER — Ambulatory Visit (INDEPENDENT_AMBULATORY_CARE_PROVIDER_SITE_OTHER): Payer: Medicare Other

## 2020-10-09 ENCOUNTER — Other Ambulatory Visit: Payer: Self-pay

## 2020-10-09 VITALS — BP 132/64 | HR 90 | Temp 98.3°F | Ht 60.8 in | Wt 144.8 lb

## 2020-10-09 DIAGNOSIS — I272 Pulmonary hypertension, unspecified: Secondary | ICD-10-CM | POA: Diagnosis not present

## 2020-10-09 DIAGNOSIS — E119 Type 2 diabetes mellitus without complications: Secondary | ICD-10-CM

## 2020-10-09 DIAGNOSIS — Z0000 Encounter for general adult medical examination without abnormal findings: Secondary | ICD-10-CM

## 2020-10-09 DIAGNOSIS — M67432 Ganglion, left wrist: Secondary | ICD-10-CM | POA: Diagnosis not present

## 2020-10-09 LAB — POCT URINALYSIS DIPSTICK
Bilirubin, UA: NEGATIVE
Blood, UA: NEGATIVE
Glucose, UA: NEGATIVE
Ketones, UA: NEGATIVE
Leukocytes, UA: NEGATIVE
Nitrite, UA: NEGATIVE
Protein, UA: NEGATIVE
Spec Grav, UA: 1.02 (ref 1.010–1.025)
Urobilinogen, UA: 0.2 E.U./dL
pH, UA: 6 (ref 5.0–8.0)

## 2020-10-09 LAB — POCT UA - MICROALBUMIN
Albumin/Creatinine Ratio, Urine, POC: <30
Creatinine, POC: 200 mg/dL
Microalbumin Ur, POC: 30 mg/L

## 2020-10-09 NOTE — Patient Instructions (Signed)
Brianna Adams , Thank you for taking time to come for your Medicare Wellness Visit. I appreciate your ongoing commitment to your health goals. Please review the following plan we discussed and let me know if I can assist you in the future.   Screening recommendations/referrals: Colonoscopy: not required Mammogram: completed 01/15/2020 Bone Density: completed 08/23/2017 Recommended yearly ophthalmology/optometry visit for glaucoma screening and checkup Recommended yearly dental visit for hygiene and checkup  Vaccinations: Influenza vaccine: completed 05/13/2020, due 04/06/2021 Pneumococcal vaccine: completed 10/31/2013 Tdap vaccine: completed 08/17/2017 Shingles vaccine: completed   Covid-19: 06/11/2020, 11/19/2019, 10/20/2019  Advanced directives: Please bring a copy of your POA (Power of Attorney) and/or Living Will to your next appointment.   Conditions/risks identified: none  Next appointment: Follow up in one year for your annual wellness visit    Preventive Care 65 Years and Older, Female Preventive care refers to lifestyle choices and visits with your health care provider that can promote health and wellness. What does preventive care include?  A yearly physical exam. This is also called an annual well check.  Dental exams once or twice a year.  Routine eye exams. Ask your health care provider how often you should have your eyes checked.  Personal lifestyle choices, including:  Daily care of your teeth and gums.  Regular physical activity.  Eating a healthy diet.  Avoiding tobacco and drug use.  Limiting alcohol use.  Practicing safe sex.  Taking low-dose aspirin every day.  Taking vitamin and mineral supplements as recommended by your health care provider. What happens during an annual well check? The services and screenings done by your health care provider during your annual well check will depend on your age, overall health, lifestyle risk factors, and family history  of disease. Counseling  Your health care provider may ask you questions about your:  Alcohol use.  Tobacco use.  Drug use.  Emotional well-being.  Home and relationship well-being.  Sexual activity.  Eating habits.  History of falls.  Memory and ability to understand (cognition).  Work and work Statistician.  Reproductive health. Screening  You may have the following tests or measurements:  Height, weight, and BMI.  Blood pressure.  Lipid and cholesterol levels. These may be checked every 5 years, or more frequently if you are over 17 years old.  Skin check.  Lung cancer screening. You may have this screening every year starting at age 54 if you have a 30-pack-year history of smoking and currently smoke or have quit within the past 15 years.  Fecal occult blood test (FOBT) of the stool. You may have this test every year starting at age 8.  Flexible sigmoidoscopy or colonoscopy. You may have a sigmoidoscopy every 5 years or a colonoscopy every 10 years starting at age 8.  Hepatitis C blood test.  Hepatitis B blood test.  Sexually transmitted disease (STD) testing.  Diabetes screening. This is done by checking your blood sugar (glucose) after you have not eaten for a while (fasting). You may have this done every 1-3 years.  Bone density scan. This is done to screen for osteoporosis. You may have this done starting at age 78.  Mammogram. This may be done every 1-2 years. Talk to your health care provider about how often you should have regular mammograms. Talk with your health care provider about your test results, treatment options, and if necessary, the need for more tests. Vaccines  Your health care provider may recommend certain vaccines, such as:  Influenza vaccine. This  is recommended every year.  Tetanus, diphtheria, and acellular pertussis (Tdap, Td) vaccine. You may need a Td booster every 10 years.  Zoster vaccine. You may need this after age  71.  Pneumococcal 13-valent conjugate (PCV13) vaccine. One dose is recommended after age 22.  Pneumococcal polysaccharide (PPSV23) vaccine. One dose is recommended after age 68. Talk to your health care provider about which screenings and vaccines you need and how often you need them. This information is not intended to replace advice given to you by your health care provider. Make sure you discuss any questions you have with your health care provider. Document Released: 09/19/2015 Document Revised: 05/12/2016 Document Reviewed: 06/24/2015 Elsevier Interactive Patient Education  2017 [place] Prevention in the Home Falls can cause injuries. They can happen to people of all ages. There are many things you can do to make your home safe and to help prevent falls. What can I do on the outside of my home?  Regularly fix the edges of walkways and driveways and fix any cracks.  Remove anything that might make you trip as you walk through a door, such as a raised step or threshold.  Trim any bushes or trees on the path to your home.  Use bright outdoor lighting.  Clear any walking paths of anything that might make someone trip, such as rocks or tools.  Regularly check to see if handrails are loose or broken. Make sure that both sides of any steps have handrails.  Any raised decks and porches should have guardrails on the edges.  Have any leaves, snow, or ice cleared regularly.  Use sand or salt on walking paths during winter.  Clean up any spills in your garage right away. This includes oil or grease spills. What can I do in the bathroom?  Use night lights.  Install grab bars by the toilet and in the tub and shower. Do not use towel bars as grab bars.  Use non-skid mats or decals in the tub or shower.  If you need to sit down in the shower, use a plastic, non-slip stool.  Keep the floor dry. Clean up any water that spills on the floor as soon as it happens.  Remove  soap buildup in the tub or shower regularly.  Attach bath mats securely with double-sided non-slip rug tape.  Do not have throw rugs and other things on the floor that can make you trip. What can I do in the bedroom?  Use night lights.  Make sure that you have a light by your bed that is easy to reach.  Do not use any sheets or blankets that are too big for your bed. They should not hang down onto the floor.  Have a firm chair that has side arms. You can use this for support while you get dressed.  Do not have throw rugs and other things on the floor that can make you trip. What can I do in the kitchen?  Clean up any spills right away.  Avoid walking on wet floors.  Keep items that you use a lot in easy-to-reach places.  If you need to reach something above you, use a strong step stool that has a grab bar.  Keep electrical cords out of the way.  Do not use floor polish or wax that makes floors slippery. If you must use wax, use non-skid floor wax.  Do not have throw rugs and other things on the floor that can make you  trip. What can I do with my stairs?  Do not leave any items on the stairs.  Make sure that there are handrails on both sides of the stairs and use them. Fix handrails that are broken or loose. Make sure that handrails are as long as the stairways.  Check any carpeting to make sure that it is firmly attached to the stairs. Fix any carpet that is loose or worn.  Avoid having throw rugs at the top or bottom of the stairs. If you do have throw rugs, attach them to the floor with carpet tape.  Make sure that you have a light switch at the top of the stairs and the bottom of the stairs. If you do not have them, ask someone to add them for you. What else can I do to help prevent falls?  Wear shoes that:  Do not have high heels.  Have rubber bottoms.  Are comfortable and fit you well.  Are closed at the toe. Do not wear sandals.  If you use a  stepladder:  Make sure that it is fully opened. Do not climb a closed stepladder.  Make sure that both sides of the stepladder are locked into place.  Ask someone to hold it for you, if possible.  Clearly mark and make sure that you can see:  Any grab bars or handrails.  First and last steps.  Where the edge of each step is.  Use tools that help you move around (mobility aids) if they are needed. These include:  Canes.  Walkers.  Scooters.  Crutches.  Turn on the lights when you go into a dark area. Replace any light bulbs as soon as they burn out.  Set up your furniture so you have a clear path. Avoid moving your furniture around.  If any of your floors are uneven, fix them.  If there are any pets around you, be aware of where they are.  Review your medicines with your doctor. Some medicines can make you feel dizzy. This can increase your chance of falling. Ask your doctor what other things that you can do to help prevent falls. This information is not intended to replace advice given to you by your health care provider. Make sure you discuss any questions you have with your health care provider. Document Released: 06/19/2009 Document Revised: 01/29/2016 Document Reviewed: 09/27/2014 Elsevier Interactive Patient Education  2017 [employer].

## 2020-10-09 NOTE — Patient Instructions (Signed)
Diabetes Mellitus Basics  Diabetes mellitus, or diabetes, is a long-term (chronic) disease. It occurs when the body does not properly use sugar (glucose) that is released from food after you eat. Diabetes mellitus may be caused by one or both of these problems:  Your pancreas does not make enough of a hormone called insulin.  Your body does not react in a normal way to the insulin that it makes. Insulin lets glucose enter cells in your body. This gives you energy. If you have diabetes, glucose cannot get into cells. This causes high blood glucose (hyperglycemia). How to treat and manage diabetes You may need to take insulin or other diabetes medicines daily to keep your glucose in balance. If you are prescribed insulin, you will learn how to give yourself insulin by injection. You may need to adjust the amount of insulin you take based on the foods that you eat. You will need to check your blood glucose levels using a glucose monitor as told by your health care provider. The readings can help determine if you have low or high blood glucose. Generally, you should have these blood glucose levels:  Before meals (preprandial): 80-130 mg/dL (4.4-7.2 mmol/L).  After meals (postprandial): below 180 mg/dL (10 mmol/L).  Hemoglobin A1c (HbA1c) level: less than 7%. Your health care provider will set treatment goals for you. Keep all follow-up visits. This is important. Follow these instructions at home: Diabetes medicines Take your diabetes medicines every day as told by your health care provider. List your diabetes medicines here:  Name of medicine: ______________________________ ? Amount (dose): _______________ Time (a.m./p.m.): _______________ Notes: ___________________________________  Name of medicine: ______________________________ ? Amount (dose): _______________ Time (a.m./p.m.): _______________ Notes: ___________________________________  Name of medicine:  ______________________________ ? Amount (dose): _______________ Time (a.m./p.m.): _______________ Notes: ___________________________________ Insulin If you use insulin, list the types of insulin you use here:  Insulin type: ______________________________ ? Amount (dose): _______________ Time (a.m./p.m.): _______________Notes: ___________________________________  Insulin type: ______________________________ ? Amount (dose): _______________ Time (a.m./p.m.): _______________ Notes: ___________________________________  Insulin type: ______________________________ ? Amount (dose): _______________ Time (a.m./p.m.): _______________ Notes: ___________________________________  Insulin type: ______________________________ ? Amount (dose): _______________ Time (a.m./p.m.): _______________ Notes: ___________________________________  Insulin type: ______________________________ ? Amount (dose): _______________ Time (a.m./p.m.): _______________ Notes: ___________________________________ Managing blood glucose Check your blood glucose levels using a glucose monitor as told by your health care provider. Write down the times that you check your glucose levels here:  Time: _______________ Notes: ___________________________________  Time: _______________ Notes: ___________________________________  Time: _______________ Notes: ___________________________________  Time: _______________ Notes: ___________________________________  Time: _______________ Notes: ___________________________________  Time: _______________ Notes: ___________________________________   Low blood glucose Low blood glucose (hypoglycemia) is when glucose is at or below 70 mg/dL (3.9 mmol/L). Symptoms may include:  Feeling: ? Hungry. ? Sweaty and clammy. ? Irritable or easily upset. ? Dizzy. ? Sleepy.  Having: ? A fast heartbeat. ? A headache. ? A change in your vision. ? Numbness around the mouth, lips, or  tongue.  Having trouble with: ? Moving (coordination). ? Sleeping. Treating low blood glucose To treat low blood glucose, eat or drink something containing sugar right away. If you can think clearly and swallow safely, follow the 15:15 rule:  Take 15 grams of a fast-acting carb (carbohydrate), as told by your health care provider.  Some fast-acting carbs are: ? Glucose tablets: take 3-4 tablets. ? Hard candy: eat 3-5 pieces. ? Fruit juice: drink 4 oz (120 mL). ? Regular (not diet) soda: drink 4-6 oz (120-180 mL). ? Honey or sugar:  eat 1 Tbsp (15 mL).  Check your blood glucose levels 15 minutes after you take the carb.  If your glucose is still at or below 70 mg/dL (3.9 mmol/L), take 15 grams of a carb again.  If your glucose does not go above 70 mg/dL (3.9 mmol/L) after 3 tries, get help right away.  After your glucose goes back to normal, eat a meal or a snack within 1 hour. Treating very low blood glucose If your glucose is at or below 54 mg/dL (3 mmol/L), you have very low blood glucose (severe hypoglycemia). This is an emergency. Do not wait to see if the symptoms will go away. Get medical help right away. Call your local emergency services (911 in the [place] Do not drive yourself to the hospital. Questions to ask your health care provider  Should I talk with a diabetes educator?  What equipment will I need to care for myself at home?  What diabetes medicines do I need? When should I take them?  How often do I need to check my blood glucose levels?  What number can I call if I have questions?  When is my follow-up visit?  Where can I find a support group for people with diabetes? Where to find more information  American Diabetes Association: www.diabetes.org  Association of Diabetes Care and Education Specialists: www.diabeteseducator.org Contact a health care provider if:  Your blood glucose is at or above 240 mg/dL (13.3 mmol/L) for 2 days in a row.  You have  been sick or have had a fever for 2 days or more, and you are not getting better.  You have any of these problems for more than 6 hours: ? You cannot eat or drink. ? You feel nauseous. ? You vomit. ? You have diarrhea. Get help right away if:  Your blood glucose is lower than 54 mg/dL (3 mmol/L).  You get confused.  You have trouble thinking clearly.  You have trouble breathing. These symptoms may represent a serious problem that is an emergency. Do not wait to see if the symptoms will go away. Get medical help right away. Call your local emergency services (911 in the [place] Do not drive yourself to the hospital. Summary  Diabetes mellitus is a chronic disease that occurs when the body does not properly use sugar (glucose) that is released from food after you eat.  Take insulin and diabetes medicines as told.  Check your blood glucose every day, as often as told.  Keep all follow-up visits. This is important. This information is not intended to replace advice given to you by your health care provider. Make sure you discuss any questions you have with your health care provider. Document Revised: 12/25/2019 Document Reviewed: 12/25/2019 Elsevier Patient Education  2021 [employer].

## 2020-10-09 NOTE — Progress Notes (Signed)
I,Yamilka [employer] as a Education administrator for [employer], [name].,have documented all relevant documentation on the behalf of [name], [name],as directed by  [name], [name] while in the presence of [name], [place]. This visit occurred during the SARS-CoV-2 public health emergency.  Safety protocols were in place, including screening questions prior to the visit, additional usage of staff PPE, and extensive cleaning of exam room while observing appropriate contact time as indicated for disinfecting solutions.  Subjective:     Patient ID: Brianna Adams , female    DOB: 02-14-1941 , 80 y.o.   MRN: [id number]   Chief Complaint  Patient presents with  . Diabetes    HPI  Patient presents today for a diabetes f/u.  She had a lung scan which was good.  She had her ganglion cyst removed from her left wrist stitches were removed, nest appt next week.  She will be returning to Pulmonary Rehab on 2/8 - she has 4 more sessions. She is wearing oxygen around the clock at 3 l/m.   Wt Readings from Last 3 Encounters: 10/09/20 : 144 lb 13.5 oz (65.7 kg) 10/09/20 : 144 lb 12.8 oz (65.7 kg) 09/11/20 : 154 lb 15.7 oz (70.3 kg)  Diabetes She presents for her follow-up diabetic visit. She has type 2 diabetes mellitus. Pertinent negatives for hypoglycemia include no dizziness or headaches. There are no diabetic associated symptoms. Pertinent negatives for diabetes include no chest pain.     Past Medical History:  Diagnosis Date  . CHF (congestive heart failure) ([place])   . COPD (chronic obstructive pulmonary disease) ([place])   . Diabetes ([place])    type 2  . Diverticulitis   . Dyspnea    On Oxygen 3L via [place]   . GERD (gastroesophageal reflux disease)   . High cholesterol   . Lung cancer ([place])   . PE (pulmonary thromboembolism) ([place]) 02/29/2020  . Pulmonary hypertension ([place])   . Uterine cancer ([place])      Family History  Problem Relation Age of Onset  . Hypertension Mother   . Heart attack Father    . Cancer Maternal Aunt      Current Outpatient Medications:  .  acetaminophen (TYLENOL) 500 MG tablet, Take 1,000 mg by mouth every 6 (six) hours as needed for mild pain or fever. , Disp: , Rfl:  .  albuterol (VENTOLIN HFA) 108 (90 Base) MCG/ACT inhaler, Inhale 2 puffs into the lungs every 6 (six) hours as needed for wheezing or shortness of breath., Disp: 18 g, Rfl: 0 .  calcium carbonate (OSCAL) 1500 (600 Ca) MG TABS tablet, Take 1,200 mg by mouth daily., Disp: , Rfl:  .  Cinnamon 500 MG TABS, Take 1,000 mg by mouth daily. , Disp: , Rfl:  .  ELIQUIS 5 MG TABS tablet, TAKE 1 TABLET BY MOUTH TWICE A DAY, Disp: 60 tablet, Rfl: 2 .  furosemide (LASIX) 20 MG tablet, TAKE 1 TABLET BY MOUTH EVERY DAY (Patient taking differently: Take 20 mg by mouth daily.), Disp: 90 tablet, Rfl: 2 .  metFORMIN (GLUCOPHAGE) 1000 MG tablet, TAKE 1 TABLET (1,000 MG TOTAL) BY MOUTH 2 (TWO) TIMES DAILY WITH A MEAL., Disp: 180 tablet, Rfl: 0 .  Multiple Vitamins-Minerals (CENTRUM SILVER PO), Take 1 tablet by mouth daily. , Disp: , Rfl:  .  Multiple Vitamins-Minerals (OCUVITE EYE HEALTH FORMULA PO), Take 1 capsule by mouth daily., Disp: , Rfl:  .  omeprazole (PRILOSEC) 40 MG capsule, TAKE 1 CAPSULE BY MOUTH EVERY DAY,  Disp: 90 capsule, Rfl: 1 .  pravastatin (PRAVACHOL) 10 MG tablet, TAKE 1 TABLET BY MOUTH EVERY DAY, Disp: 90 tablet, Rfl: 1 .  traMADol (ULTRAM) 50 MG tablet, Take 1 tablet (50 mg total) by mouth every 6 (six) hours as needed. (Patient not taking: Reported on 10/09/2020), Disp: 20 tablet, Rfl: 0 .  umeclidinium-vilanterol (ANORO ELLIPTA) 62.5-25 MCG/INH AEPB, Inhale 1 puff into the lungs daily., Disp: 60 each, Rfl: 5 .  vitamin C (ASCORBIC ACID) 500 MG tablet, Take 500 mg by mouth daily. , Disp: , Rfl:  .  vitamin E 1000 UNIT capsule, Take 1,000 Units by mouth 3 (three) times a week. Takes three times a week, Disp: , Rfl:    Allergies  Allergen Reactions  . Penicillin G Shortness Of Breath    Other  reaction(s): Rash  . Ace Inhibitors Swelling  . Atorvastatin Other (See Comments)    Other reaction(s): Myalgias (Muscle Pain)   . Cortisone Other (See Comments)    Other reaction(s): Patient not sure  . Hydrocortisone Other (See Comments)    unknown  . Lisinopril Other (See Comments)    Other reaction(s): Angioedema  . Meloxicam Other (See Comments)    Bloody stools  . Penicillins Hives    60 years ago  . Pravastatin Other (See Comments)     Chest Pain High dosage      Review of Systems  Constitutional: Negative.   Respiratory: Negative.  Negative for cough.   Cardiovascular: Negative.  Negative for chest pain, palpitations and leg swelling.  Neurological: Negative for dizziness and headaches.  Psychiatric/Behavioral: Negative.      Today's Vitals   10/09/20 1019  BP: 132/64  Pulse: 90  Temp: 98.3 F (36.8 C)  TempSrc: Oral  Weight: 144 lb 13.5 oz (65.7 kg)  Height: 5' 0.8" (1.544 m)   Body mass index is 27.55 kg/m.   Objective:  Physical Exam Vitals reviewed.  Constitutional:      General: She is not in acute distress.    Appearance: Normal appearance.  Cardiovascular:     Rate and Rhythm: Normal rate and regular rhythm.     Pulses: Normal pulses.     Heart sounds: Normal heart sounds. No murmur heard.   Pulmonary:     Effort: Pulmonary effort is normal. No respiratory distress.     Breath sounds: Normal breath sounds. No wheezing.  Neurological:     Mental Status: She is alert.  Psychiatric:        Mood and Affect: Mood normal.        Behavior: Behavior normal.        Thought Content: Thought content normal.        Judgment: Judgment normal.         Assessment And Plan:     1. Type 2 diabetes mellitus without complication, without long-term current use of insulin (HCC)  Chronic, blood sugar has been controlled  Will check HgbA1c  Continue with eating a diet low in sugar - Hemoglobin A1c  2. Pulmonary hypertension, unspecified  ([place])  She is to start cardiac rehab next week.  3. Ganglion cyst of wrist, left  She had surgically removed in January and is doing well   Patient was given opportunity to ask questions. Patient verbalized understanding of the plan and was able to repeat key elements of the plan. All questions were answered to their satisfaction.  [name], [name]   Teola Bradley, [name], have reviewed all documentation for this  visit. The documentation on 11/02/20 for the exam, diagnosis, procedures, and orders are all accurate and complete.   THE PATIENT IS ENCOURAGED TO PRACTICE SOCIAL DISTANCING DUE TO THE COVID-19 PANDEMIC.

## 2020-10-09 NOTE — Addendum Note (Signed)
Addended by: Glenna Durand E on: 10/09/2020 12:06 PM   Modules accepted: Orders

## 2020-10-09 NOTE — Progress Notes (Signed)
Subjective:   Lexi Conaty is a 80 y.o. female who presents for Medicare Annual (Subsequent) preventive examination.  Review of Systems     Cardiac Risk Factors include: advanced age (>18mn, >>37women);diabetes mellitus     Objective:    Today's Vitals   10/09/20 1006  BP: 132/64  Pulse: 90  Temp: 98.3 F (36.8 C)  TempSrc: Oral  SpO2: 90%  Weight: 144 lb 12.8 oz (65.7 kg)  Height: 5' 0.8" (1.544 m)   Body mass index is 27.54 kg/m.  Advanced Directives 10/09/2020 09/11/2020 02/29/2020 02/28/2020 10/04/2019 03/29/2018 03/21/2018  Does Patient Have a Medical Advance Directive? Yes Yes No No Yes Yes Yes  Type of AParamedicof ACentennial ParkLiving will Living will - HCovingtonLiving will HSidneyLiving will H[place]ainviewLiving will H[place]awoodLiving will  Does patient want to make changes to medical advance directive? - No - Patient declined - - - - -  Copy of [place] Site 1in Chart? No - copy requested - - - No - copy requested - -  Would patient like information on creating a medical advance directive? - - No - Patient declined - - No - Patient declined -    Current Medications (verified) Outpatient Encounter Medications as of 10/09/2020  Medication Sig  . acetaminophen (TYLENOL) 500 MG tablet Take 1,000 mg by mouth every 6 (six) hours as needed for mild pain or fever.   .Marland Kitchenalbuterol (VENTOLIN HFA) 108 (90 Base) MCG/ACT inhaler Inhale 2 puffs into the lungs every 6 (six) hours as needed for wheezing or shortness of breath.  . calcium carbonate (OSCAL) 1500 (600 Ca) MG TABS tablet Take 1,200 mg by mouth daily.  . Cinnamon 500 MG TABS Take 1,000 mg by mouth daily.   .Marland KitchenELIQUIS 5 MG TABS tablet TAKE 1 TABLET BY MOUTH TWICE A DAY  . furosemide (LASIX) 20 MG tablet TAKE 1 TABLET BY MOUTH EVERY DAY (Patient taking differently: Take 20 mg by mouth daily.)  . metFORMIN (GLUCOPHAGE) 1000  MG tablet TAKE 1 TABLET (1,000 MG TOTAL) BY MOUTH 2 (TWO) TIMES DAILY WITH A MEAL.  . Multiple Vitamins-Minerals (CENTRUM SILVER PO) Take 1 tablet by mouth daily.   . Multiple Vitamins-Minerals (OCUVITE EYE HEALTH FORMULA PO) Take 1 capsule by mouth daily.  .Marland Kitchenomeprazole (PRILOSEC) 40 MG capsule TAKE 1 CAPSULE BY MOUTH EVERY DAY  . pravastatin (PRAVACHOL) 10 MG tablet TAKE 1 TABLET BY MOUTH EVERY DAY (Patient taking differently: Take 10 mg by mouth daily.)  . umeclidinium-vilanterol (ANORO ELLIPTA) 62.5-25 MCG/INH AEPB Inhale 1 puff into the lungs daily.  . vitamin C (ASCORBIC ACID) 500 MG tablet Take 500 mg by mouth daily.   . vitamin E 1000 UNIT capsule Take 1,000 Units by mouth 3 (three) times a week. Takes three times a week  . traMADol (ULTRAM) 50 MG tablet Take 1 tablet (50 mg total) by mouth every 6 (six) hours as needed. (Patient not taking: Reported on 10/09/2020)   No facility-administered encounter medications on file as of 10/09/2020.    Allergies (verified) Penicillin g, Ace inhibitors, Atorvastatin, Cortisone, Hydrocortisone, Lisinopril, Meloxicam, Penicillins, and Pravastatin   History: Past Medical History:  Diagnosis Date  . CHF (congestive heart failure) ([place]   . COPD (chronic obstructive pulmonary disease) ([place]   . Diabetes ([place]    type 2  . Diverticulitis   . Dyspnea    On Oxygen 3L via [place]   .  GERD (gastroesophageal reflux disease)   . High cholesterol   . Lung cancer ([place])   . PE (pulmonary thromboembolism) ([place]) 02/29/2020  . Pulmonary hypertension ([place])   . Uterine cancer [facility])    Past Surgical History:  Procedure Laterality Date  . ABDOMINAL HYSTERECTOMY    . cataract surgery  2012 and 2017  . COLONOSCOPY    . FIBEROPTIC BRONCHOSCOPY    . GANGLION CYST EXCISION Left 09/11/2020   Procedure: EXCISION VOLAR RADIAL GANGLION OF LEFT WRIST;  Surgeon: Daryll Brod, MD;  Location: [facility];  Service: Orthopedics;  Laterality: Left;  AXILLARY BLOCK  . resection of lung  cancer  2018  . right lower lobe non-anatomocal lung resection wedge    . right thorascoscopy     Family History  Problem Relation Age of Onset  . Hypertension Mother   . Heart attack Father   . Cancer Maternal Aunt    Social History   Socioeconomic History  . Marital status: Widowed    Spouse name: Not on file  . Number of children: Not on file  . Years of education: Not on file  . Highest education level: Not on file  Occupational History  . Occupation: retired  Tobacco Use  . Smoking status: Former Smoker    Packs/day: 1.00    Years: 58.00    Pack years: 58.00    Types: Cigarettes    Quit date: 10/22/2015    Years since quitting: 4.9  . Smokeless tobacco: Never Used  Vaping Use  . Vaping Use: Never used  Substance and Sexual Activity  . Alcohol use: Never  . Drug use: Never  . Sexual activity: Not Currently    Birth control/protection: Surgical    Comment: HYSTERECTOMY  Other Topics Concern  . Not on file  Social History Narrative  . Not on file   Social Determinants of Health   Financial Resource Strain: Low Risk   . Difficulty of Paying Living Expenses: Not hard at all  Food Insecurity: No Food Insecurity  . Worried About Charity fundraiser in the Last Year: Never true  . Ran Out of Food in the Last Year: Never true  Transportation Needs: No Transportation Needs  . Lack of Transportation (Medical): No  . Lack of Transportation (Non-Medical): No  Physical Activity: Insufficiently Active  . Days of Exercise per Week: 5 days  . Minutes of Exercise per Session: 20 min  Stress: No Stress Concern Present  . Feeling of Stress : Not at all  Social Connections: Not on file    Tobacco Counseling Counseling given: Not Answered   Clinical Intake:  Pre-visit preparation completed: Yes  Pain : No/denies pain     Nutritional Status: BMI 25 -29 Overweight Nutritional Risks: None Diabetes: Yes  How often do you need to have someone help you when you  read instructions, pamphlets, or other written materials from your doctor or pharmacy?: 1 - Never What is the last grade level you completed in school?: masters degree  Diabetic? Yes Nutrition Risk Assessment:  Has the patient had any N/V/D within the last 2 months?  No  Does the patient have any non-healing wounds?  No  Has the patient had any unintentional weight loss or weight gain?  No   Diabetes:  Is the patient diabetic?  Yes  If diabetic, was a CBG obtained today?  No  Did the patient bring in their glucometer from home?  No  How often do you monitor your  CBG's? Does not.   Financial Strains and Diabetes Management:  Are you having any financial strains with the device, your supplies or your medication? No .  Does the patient want to be seen by Chronic Care Management for management of their diabetes?  No  Would the patient like to be referred to a Nutritionist or for Diabetic Management?  No   Diabetic Exams:  Diabetic Eye Exam: Completed 03/18/2020 Diabetic Foot Exam: Overdue, Pt has been advised about the importance in completing this exam. Pt is scheduled for diabetic foot exam on next appointment.   Interpreter Needed?: No  Information entered by :: NAllenLPN   Activities of Daily Living In your present state of health, do you have any difficulty performing the following activities: 10/09/2020 09/11/2020  Hearing? N N  Vision? N N  Difficulty concentrating or making decisions? Y N  Comment some memory -  Walking or climbing stairs? Y Y  Comment - SOB with exertion  Dressing or bathing? N N  Doing errands, shopping? Y -  Comment always has someone with her -  Conservation officer, nature and eating ? N -  Using the Toilet? N -  In the past six months, have you accidently leaked urine? N -  Do you have problems with loss of bowel control? N -  Managing your Medications? N -  Managing your Finances? N -  Housekeeping or managing your Housekeeping? N -  Some recent data might  be hidden    Patient Care Team: [name], [name] as PCP - General (General Practice)  Indicate any recent Medical Services you may have received from other than [facility] providers in the past year (date may be approximate).     Assessment:   This is a routine wellness examination for Aanika.  Hearing/Vision screen  Hearing Screening   _0  _1  _2  _3  _4  _5  _6  _7  _8   Right ear:           Left ear:           Vision Screening Comments: Regular eye exams, Melanee Spry  Dietary issues and exercise activities discussed: Current Exercise Habits: Home exercise routine, Type of exercise: Other - see comments (peddling machine), Time (Minutes): 20, Frequency (Times/Week): 5, Weekly Exercise (Minutes/Week): 100  Goals    . Patient Stated     10/04/2019, wants to have raised area on left wrist taken care of and increase breathing capacity    . Patient Stated     10/09/2020, wants breathing to improve      Depression Screen PHQ 2/9 Scores 10/09/2020 04/21/2020 10/04/2019 10/04/2019 06/14/2019 03/29/2018  PHQ - 2 Score 0 0 0 0 0 0  PHQ- 9 Score - 0 0 - - -    Fall Risk Fall Risk  10/09/2020 04/03/2020 02/28/2020 10/04/2019 10/04/2019  Falls in the past year? 0 0 0 0 0  Number falls in past yr: - 0 - - -  Injury with Fall? - 0 - - -  Risk for fall due to : Medication side effect Impaired balance/gait No Fall Risks Medication side effect -  Follow up Falls evaluation completed;Education provided;Falls prevention discussed Falls evaluation completed Falls prevention discussed Falls evaluation completed;Education provided;Falls prevention discussed -    FALL RISK PREVENTION PERTAINING TO THE HOME:  Any stairs in or around the home? Yes  If so, are there any without handrails? No  Home free of loose throw rugs in walkways, pet beds, electrical cords, etc? Yes  Adequate lighting in  your home to reduce risk of falls? Yes   ASSISTIVE DEVICES UTILIZED TO PREVENT  FALLS:  Life alert? No  Use of a cane, walker or w/c? No  Grab bars in the bathroom? Yes  Shower chair or bench in shower? Yes  Elevated toilet seat or a handicapped toilet? Yes   TIMED UP AND GO:  Was the test performed? No .    Gait slow and steady without use of assistive device  Cognitive Function:     6CIT Screen 10/09/2020 10/04/2019  What Year? 0 points 0 points  What month? 0 points 0 points  What time? 0 points 3 points  Count back from 20 0 points 0 points  Months in reverse 0 points 0 points  Repeat phrase 6 points 2 points  Total Score 6 5    Immunizations Immunization History  Administered Date(s) Administered  . Fluad Quad(high Dose 65+) 05/24/2019, 05/13/2020  . Influenza-Unspecified 06/06/2018  . PFIZER(Purple Top)SARS-COV-2 Vaccination 10/20/2019, 11/19/2019, 06/11/2020  . Pneumococcal Conjugate-13 10/31/2013  . Pneumococcal Polysaccharide-23 11/05/2007  . Td 09/07/2003  . Tdap 08/17/2017  . Zoster 11/05/2010  . Zoster Recombinat (Shingrix) 08/17/2017, 06/28/2019, 09/05/2019    TDAP status: Up to date  Flu Vaccine status: Up to date  Pneumococcal vaccine status: Up to date  Covid-19 vaccine status: Completed vaccines  Qualifies for Shingles Vaccine? Yes   Zostavax completed Yes   Shingrix Completed?: Yes  Screening Tests Health Maintenance  Topic Date Due  . FOOT EXAM  06/13/2020  . URINE MICROALBUMIN  10/03/2020  . HEMOGLOBIN A1C  11/10/2020  . COVID-19 Vaccine (4 - Booster for Pfizer series) 12/10/2020  . MAMMOGRAM  01/14/2021  . OPHTHALMOLOGY EXAM  03/18/2021  . TETANUS/TDAP  08/18/2027  . INFLUENZA VACCINE  Completed  . DEXA SCAN  Completed  . PNA vac Low Risk Adult  Completed    Health Maintenance  Health Maintenance Due  Topic Date Due  . FOOT EXAM  06/13/2020  . URINE MICROALBUMIN  10/03/2020    Colorectal cancer screening: No longer required.   Mammogram status: No longer required due to 01/15/2020.  Bone Density  status: Completed 08/23/2017.   Lung Cancer Screening: (Low Dose CT Chest recommended if Age 26-80 years, 30 pack-year currently smoking OR have quit w/in 15years.) does not qualify.   Lung Cancer Screening Referral: no  Additional Screening:  Hepatitis C Screening: does not qualify;   Vision Screening: Recommended annual ophthalmology exams for early detection of glaucoma and other disorders of the eye. Is the patient up to date with their annual eye exam?  Yes  Who is the provider or what is the name of the office in which the patient attends annual eye exams? Dr. Janus Molder If pt is not established with a provider, would they like to be referred to a provider to establish care? No .   Dental Screening: Recommended annual dental exams for proper oral hygiene  Community Resource Referral / Chronic Care Management: CRR required this visit?  No   CCM required this visit?  No      Plan:     I have personally reviewed and noted the following in the patient's chart:   . Medical and social history . Use of alcohol, tobacco or illicit drugs  . Current medications and supplements . Functional ability and status . Nutritional status . Physical activity . Advanced directives . List of other physicians . Hospitalizations, surgeries, and ER visits in previous 12 months . Vitals . Screenings  to include cognitive, depression, and falls . Referrals and appointments  In addition, I have reviewed and discussed with patient certain preventive protocols, quality metrics, and best practice recommendations. A written personalized care plan for preventive services as well as general preventive health recommendations were provided to patient.     Kellie Simmering, LPN   10/11/8344   Nurse Notes:

## 2020-10-10 LAB — HEMOGLOBIN A1C
Est. average glucose Bld gHb Est-mCnc: 148 mg/dL
Hgb A1c MFr Bld: 6.8 % — ABNORMAL HIGH (ref 4.8–5.6)

## 2020-10-14 ENCOUNTER — Encounter: Payer: Medicare Other | Attending: Pulmonary Disease | Admitting: *Deleted

## 2020-10-14 ENCOUNTER — Other Ambulatory Visit: Payer: Self-pay

## 2020-10-14 DIAGNOSIS — J439 Emphysema, unspecified: Secondary | ICD-10-CM | POA: Insufficient documentation

## 2020-10-14 NOTE — Progress Notes (Signed)
Daily Session Note  Patient Details  Name: Brianna Adams MRN: [id number] Date of Birth: 05-May-1941 Referring Provider:   Flowsheet Row Pulmonary Rehab from 04/21/2020 in [facility] Card[facility] Pulmonary Re[facility]  Referring Provider [name]      Encounter Date: 10/14/2020  Check In:  Session Check In - 10/14/20 1116      Check-In   Supervising physician immediately available to respond to emergencies See telemetry face sheet for immediately available ER MD    Location [facility]-Cardiac & Pulmonary Rehab    Staff Present Heath Lark, RN, BSN, CCRP;Amanda Sommer, BA, ACSM CEP, Exercise Physiologist;Melissa Caiola RDN, LDN;Joseph Lookout [employer]    Virtual Visit No    Medication changes reported     No    Fall or balance concerns reported    No    Warm-up and Cool-down Performed on first and last piece of equipment    Resistance Training Performed Yes    VAD Patient? No    PAD/SET Patient? No      Pain Assessment   Currently in Pain? No/denies              Social History   Tobacco Use  Smoking Status Former Smoker  . Packs/day: 1.00  . Years: 58.00  . Pack years: 58.00  . Types: Cigarettes  . Quit date: 10/22/2015  . Years since quitting: 4.9  Smokeless Tobacco Never Used    Goals Met:  Proper associated with RPD/PD & O2 Sat Independence with exercise equipment Exercise tolerated well Personal goals reviewed No report of cardiac concerns or symptoms  Goals Unmet:  Not Applicable  Comments: Pt able to follow exercise prescription today without complaint.  Will continue to monitor for progression.    Dr. Emily Filbert is Medical Director for [place] and [name].

## 2020-10-16 ENCOUNTER — Other Ambulatory Visit: Payer: Self-pay

## 2020-10-16 DIAGNOSIS — J439 Emphysema, unspecified: Secondary | ICD-10-CM

## 2020-10-16 NOTE — Progress Notes (Signed)
Daily Session Note  Patient Details  Name: Brianna Adams MRN: [id number] Date of Birth: 03/18/1941 Referring Provider:   Flowsheet Row Pulmonary Rehab from 04/21/2020 in [facility] Cardiac and Pulmonary Rehab  Referring Provider [name]      Encounter Date: 10/16/2020  Check In:  Session Check In - 10/16/20 1126      Check-In   Supervising physician immediately available to respond to emergencies See telemetry face sheet for immediately available ER MD    Location [facility]-Cardiac & Pulmonary Rehab    Staff Present Birdie Sons, MPA, RN;Melissa Caiola RDN, LDN;Meredith Sherryll Burger, RN BSN;Joseph Hood RCP,RRT,BSRT    Virtual Visit No    Medication changes reported     No    Fall or balance concerns reported    No    Warm-up and Cool-down Performed on first and last piece of equipment    Resistance Training Performed Yes    VAD Patient? No    PAD/SET Patient? No      Pain Assessment   Currently in Pain? No/denies              Social History   Tobacco Use  Smoking Status Former Smoker  . Packs/day: 1.00  . Years: 58.00  . Pack years: 58.00  . Types: Cigarettes  . Quit date: 10/22/2015  . Years since quitting: 4.9  Smokeless Tobacco Never Used    Goals Met:  Independence with exercise equipment Exercise tolerated well No report of cardiac concerns or symptoms Strength training completed today  Goals Unmet:  Not Applicable  Comments: Pt able to follow exercise prescription today without complaint.  Will continue to monitor for progression.    Dr. Emily Filbert is Medical Director for [place] and [name].

## 2020-10-20 ENCOUNTER — Other Ambulatory Visit: Payer: Self-pay | Admitting: Nurse Practitioner

## 2020-10-20 DIAGNOSIS — E119 Type 2 diabetes mellitus without complications: Secondary | ICD-10-CM

## 2020-10-21 ENCOUNTER — Encounter: Payer: Medicare Other | Admitting: *Deleted

## 2020-10-21 ENCOUNTER — Other Ambulatory Visit: Payer: Self-pay

## 2020-10-21 DIAGNOSIS — J439 Emphysema, unspecified: Secondary | ICD-10-CM

## 2020-10-21 NOTE — Progress Notes (Signed)
Daily Session Note  Patient Details  Name: Brianna Adams MRN: [id number] Date of Birth: March 14, 1941 Referring Provider:   Flowsheet Row Pulmonary Rehab from 04/21/2020 in [facility]  Referring Provider [name]      Encounter Date: 10/21/2020  Check In:  Session Check In - 10/21/20 1115      Check-In   Supervising physician immediately available to respond to emergencies See telemetry face sheet for immediately available ER MD    Location [facility]iac & Pulmonary Rehab    Staff Present Heath Lark, RN, BSN, CCRP;Melissa [place] RDN, LDN;Joseph [employer], [place], ACSM CEP, Exercise Physiologist    Virtual Visit No    Medication changes reported     No    Fall or balance concerns reported    No    Warm-up and Cool-down Performed on first and last piece of equipment    Resistance Training Performed Yes    VAD Patient? No    PAD/SET Patient? No      Pain Assessment   Currently in Pain? No/denies              Social History   Tobacco Use  Smoking Status Former Smoker  . Packs/day: 1.00  . Years: 58.00  . Pack years: 58.00  . Types: Cigarettes  . Quit date: 10/22/2015  . Years since quitting: 5.0  Smokeless Tobacco Never Used    Goals Met:  Proper associated with RPD/PD & O2 Sat Exercise tolerated well No report of cardiac concerns or symptoms  Goals Unmet:  Not Applicable  Comments: Pt able to follow exercise prescription today without complaint.  Will continue to monitor for progression.    Dr. Emily Filbert is Medical Director for [place] and [name].

## 2020-10-23 ENCOUNTER — Other Ambulatory Visit: Payer: Self-pay

## 2020-10-23 DIAGNOSIS — J439 Emphysema, unspecified: Secondary | ICD-10-CM | POA: Diagnosis not present

## 2020-10-23 NOTE — Progress Notes (Signed)
Daily Session Note  Patient Details  Name: Brianna Adams MRN: [id number] Date of Birth: June 05, 1941 Referring Provider:   Flowsheet Row Pulmonary Rehab from 04/21/2020 in [facility]  Referring Provider [name]      Encounter Date: 10/23/2020  Check In:  Session Check In - 10/23/20 1122      Check-In   Supervising physician immediately available to respond to emergencies See telemetry face sheet for immediately available ER MD    Location [facility]    Staff Present Birdie Sons, MPA, RN;Melissa Caiola RDN, LDN;Meredith Sherryll Burger, RN BSN;Joseph Hood RCP,RRT,BSRT    Virtual Visit No    Medication changes reported     No    Fall or balance concerns reported    No    Warm-up and Cool-down Performed on first and last piece of equipment    Resistance Training Performed Yes    VAD Patient? No    PAD/SET Patient? No      Pain Assessment   Currently in Pain? No/denies              Social History   Tobacco Use  Smoking Status Former Smoker  . Packs/day: 1.00  . Years: 58.00  . Pack years: 58.00  . Types: Cigarettes  . Quit date: 10/22/2015  . Years since quitting: 5.0  Smokeless Tobacco Never Used    Goals Met:  Independence with exercise equipment Exercise tolerated well No report of cardiac concerns or symptoms Strength training completed today  Goals Unmet:  Not Applicable  Comments: Pt able to follow exercise prescription today without complaint.  Will continue to monitor for progression.    Dr. Emily Filbert is Medical Director for [place] and [name].

## 2020-10-23 NOTE — Patient Instructions (Addendum)
Discharge Patient Instructions  Patient Details  Name: Brianna Adams MRN: [id number] Date of Birth: 10-14-1940 Referring Provider:  Tyler Pita, MD   Number of Visits: 22  Reason for Discharge:  Patient reached a stable level of exercise. Patient independent in their exercise. Patient has met program and personal goals.  Smoking History:  Social History   Tobacco Use  Smoking Status Former Smoker  . Packs/day: 1.00  . Years: 58.00  . Pack years: 58.00  . Types: Cigarettes  . Quit date: 10/22/2015  . Years since quitting: 5.0  Smokeless Tobacco Never Used    Diagnosis:  Pulmonary emphysema, unspecified emphysema type ([place])  Initial Exercise Prescription:   Discharge Exercise Prescription (Final Exercise Prescription Changes):  Exercise Prescription Changes - 10/21/20 1400      Response to Exercise   Blood Pressure (Admit) 130/70    Blood Pressure (Exercise) 140/64    Blood Pressure (Exit) 120/62    Heart Rate (Admit) 95 bpm    Heart Rate (Exercise) 113 bpm    Heart Rate (Exit) 92 bpm    Oxygen Saturation (Admit) 92 %    Oxygen Saturation (Exercise) 85 %    Oxygen Saturation (Exit) 98 %    Rating of Perceived Exertion (Exercise) 13    Perceived Dyspnea (Exercise) 0    Symptoms none    Duration Continue with 30 min of aerobic exercise without signs/symptoms of physical distress.    Intensity THRR unchanged      Progression   Progression Continue to progress workloads to maintain intensity without signs/symptoms of physical distress.    Average METs 2      Resistance Training   Training Prescription Yes    Weight 3 lb    Reps 10-15      Interval Training   Interval Training No      NuStep   Level 5    SPM 80    Minutes 15    METs 2      Biostep-RELP   Level 3    Minutes 15    METs 2      Home Exercise Plan   Plans to continue exercise at Home (comment)   bike and weights   Frequency Add 2 additional days to program exercise sessions.     Initial Home Exercises Provided 06/17/20           Functional Capacity:  6 Minute Walk    Row Name 09/09/20 1129         6 Minute Walk   Phase Discharge     Distance 465 feet     Distance % Change 22 %     Distance Feet Change 85 ft     Walk Time 4.5 minutes     # of Rest Breaks 1     MPH 1.2     METS 1.43     RPE 15     Perceived Dyspnea  2     VO2 Peak 5.01     Symptoms No     Resting HR 82 bpm     Resting BP 122/64     Resting Oxygen Saturation  98 %     Exercise Oxygen Saturation  during 6 min walk 81 %     Max Ex. HR 132 bpm     Max Ex. BP 154/64           Interval HR   1 Minute HR 119     2 Minute HR  121     3 Minute HR 123     4 Minute HR 132     6 Minute HR 90     2 Minute Post HR 105     Interval Heart Rate? Yes           Interval Oxygen   Baseline Oxygen Saturation % 98 %     1 Minute Oxygen Saturation % 88 %     1 Minute Liters of Oxygen 3 L     2 Minute Oxygen Saturation % 87 %     2 Minute Liters of Oxygen 3 L     3 Minute Oxygen Saturation % 84 %     3 Minute Liters of Oxygen 3 L     4 Minute Oxygen Saturation % 82 %     4 Minute Liters of Oxygen 3 L     5 Minute Liters of Oxygen 3 L     6 Minute Oxygen Saturation % 81 %     6 Minute Liters of Oxygen 3 L     2 Minute Post Oxygen Saturation % 92 %     2 Minute Post Liters of Oxygen 3 L          Nutrition:  Nutrition Therapy & Goals - 06/17/20 1025      Nutrition Therapy   Diet Heart healthy, low Na, pulmonary MNT.    Protein (specify units) 80g    Fiber 25 grams    Whole Grain Foods 3 servings    Saturated Fats 12 max. grams    Fruits and Vegetables 5 servings/day    Sodium 1.5 grams      Personal Nutrition Goals   Nutrition Goal ST: Add vegetable servings LT: 6/7 breathing now, would like to improve SOB and lower O2 - 3L now. Would like to maintain weight.    Comments Brianna Adams likes to eat seafood, pork and chicken. She normally doesn't cook, they go out to eat. [employer],  [employer], olive garden, [place] roots, cracker barrel, scrambled [place]).  2 meals per day- goes out for both meals. 11-12pm brunch. 5-6 dinner. Doesn't eat much eggs or dairy B/L: grits, sausage or turley sausage or biscuits or [place] sandwich or soup or sometimes salad. D: fish, oysters, shrimp, hamburger steak, pork chops, chicken [place] - usually baked. Paired with rice, baked potato, sweet potato, brussels sprouts, cabbage, greens, salad, coleslaw. Drinks: water, unsweetened iced tea with lemon, diet pepsi or coke with lemon and coffee. Discussed heart healthy eating, pulmonary MNT. Brianna Adams does not want to eat at home, but her daughter Brianna Adams would like to move to doing that.      Intervention Plan   Intervention Prescribe, educate and counsel regarding individualized specific dietary modifications aiming towards targeted core components such as weight, hypertension, lipid management, diabetes, heart failure and other comorbidities.;Nutrition handout(  Discharge Patient Instructions  Patient Details  Name: Brianna Adams MRN: [id number] Date of Birth: [birth year] Referring Provider:  Tyler Pita, MD   Number of Visits: 4  Reason for Discharge:  Patient reached a stable level of exercise. Patient independent in their exercise. Patient has met program and personal goals.  Smoking History:  Social History   Tobacco Use  Smoking Status Former Smoker  . Packs/day: 1.00  . Years: 58.00  . Pack years: 58.00  . Types: Cigarettes  . Quit date: 10/22/2015  . Years since quitting: 5.0  Smokeless Tobacco Never Used    Diagnosis:  Pulmonary  emphysema, unspecified emphysema type [facility])  Initial Exercise Prescription:   Discharge Exercise Prescription (Final Exercise Prescription Changes):  Exercise Prescription Changes - 10/21/20 1400      Response to Exercise   Blood Pressure (Admit) 130/70    Blood Pressure (Exercise) 140/64    Blood Pressure (Exit) 120/62    Heart Rate  (Admit) 95 bpm    Heart Rate (Exercise) 113 bpm    Heart Rate (Exit) 92 bpm    Oxygen Saturation (Admit) 92 %    Oxygen Saturation (Exercise) 85 %    Oxygen Saturation (Exit) 98 %    Rating of Perceived Exertion (Exercise) 13    Perceived Dyspnea (Exercise) 0    Symptoms none    Duration Continue with 30 min of aerobic exercise without signs/symptoms of physical distress.    Intensity THRR unchanged      Progression   Progression Continue to progress workloads to maintain intensity without signs/symptoms of physical distress.    Average METs 2      Resistance Training   Training Prescription Yes    Weight 3 lb    Reps 10-15      Interval Training   Interval Training No      NuStep   Level 5    SPM 80    Minutes 15    METs 2      Biostep-RELP   Level 3    Minutes 15    METs 2      Home Exercise Plan   Plans to continue exercise at Home (comment)   bike and weights   Frequency Add 2 additional days to program exercise sessions.    Initial Home Exercises Provided 06/17/20           Functional Capacity:  6 Minute Walk    Row Name 09/09/20 1129         6 Minute Walk   Phase Discharge     Distance 465 feet     Distance % Change 22 %     Distance Feet Change 85 ft     Walk Time 4.5 minutes     # of Rest Breaks 1     MPH 1.2     METS 1.43     RPE 15     Perceived Dyspnea  2     VO2 Peak 5.01     Symptoms No     Resting HR 82 bpm     Resting BP 122/64     Resting Oxygen Saturation  98 %     Exercise Oxygen Saturation  during 6 min walk 81 %     Max Ex. HR 132 bpm     Max Ex. BP 154/64           Interval HR   1 Minute HR 119     2 Minute HR 121     3 Minute HR 123     4 Minute HR 132     6 Minute HR 90     2 Minute Post HR 105     Interval Heart Rate? Yes           Interval Oxygen   Baseline Oxygen Saturation % 98 %     1 Minute Oxygen Saturation % 88 %     1 Minute Liters of Oxygen 3 L     2 Minute Oxygen Saturation % 87 %     2 Minute  Liters of Oxygen 3 L  3 Minute Oxygen Saturation % 84 %     3 Minute Liters of Oxygen 3 L     4 Minute Oxygen Saturation % 82 %     4 Minute Liters of Oxygen 3 L     5 Minute Liters of Oxygen 3 L     6 Minute Oxygen Saturation % 81 %     6 Minute Liters of Oxygen 3 L     2 Minute Post Oxygen Saturation % 92 %     2 Minute Post Liters of Oxygen 3 L            Quality of Life:   Personal Goals: Goals established at orientation with interventions provided to work toward goal.    Personal Goals Discharge:  Goals and Risk Factor Review - 10/14/20 1122      Core Components/Risk Factors/Patient Goals Review   Personal Goals Review Weight Management/Obesity;Improve shortness of breath with ADL's    Review Brianna Adams still wants to maintain her weight since she has been out. Her shortness of breath at home has not been as bad.  She wants to be able to keep doing things at home without becoming short of breath.    Expected Outcomes Short: continue rehab regularly to improve Work of breathing. Long: maintain ADL's at home independently.           Exercise Goals and Review:   Exercise Goals Re-Evaluation:  Exercise Goals Re-Evaluation    Row Name 05/06/20 1154 05/21/20 1024 05/29/20 1143 06/03/20 0918 06/17/20 1131     Exercise Goal Re-Evaluation   Exercise Goals Review Able to understand and use rate of perceived exertion (RPE) scale;Able to understand and use Dyspnea scale;Knowledge and understanding of Target Heart Rate Range (THRR);Understanding of Exercise Prescription Increase Physical Activity;Increase Strength and Stamina;Understanding of Exercise Prescription Increase Physical Activity Increase Physical Activity;Understanding of Exercise Prescription;Increase Strength and Stamina Increase Physical Activity;Understanding of Exercise Prescription;Increase Strength and Stamina;Able to check pulse independently;Knowledge and understanding of Target Heart Rate Range (THRR);Able to  understand and use Dyspnea scale;Able to understand and use rate of perceived exertion (RPE) scale   Comments Reviewed RPE and dyspnea scales, THR and program prescription with pt today.  Pt voiced understanding and was given a copy of goals to take home. Brianna Adams is off to a good start in rehab.  She has completed her first two full days of exercise. We will continue to monitor her progress. When Brianna Adams is not coming to rehab she is using her pedal bike that sits on the floor. She also uses it for her arms when she puts it on the kitchen counter. She exercises for about 20 minutes at a time. Brianna Adams is doing well in rehab. She has tried a variety of different machines. She is up to 15 watts on the [employer] Bike. Will continue to monitor her progress. Reviewed home exercise with pt today.  Pt plans to use equipment at home for exercise.  Reviewed THR, pulse, RPE, sign and symptoms, pulse oximetery and when to call 911 or MD.  Also discussed weather considerations and indoor options.  Pt voiced understanding.   Expected Outcomes Short: Use RPE daily to regulate intensity. Long: Follow program prescription in THR. Short: Return to regular attendance Long; Continue to follow program prescription Short: continue to increase workout activity at home. Long: maintain home exercises independently Short: Continue to increase workloads Long: Continue to increase strength/stamina Short: monitor HR and O2 when exercising at home Long: maintain exercise  after completing [place] Name 06/19/20 6203 07/03/20 1150 07/17/20 0728 07/17/20 1106 07/30/20 1225     Exercise Goal Re-Evaluation   Exercise Goals Review Increase Physical Activity;Increase Strength and Stamina;Understanding of Exercise Prescription Increase Physical Activity;Increase Strength and Stamina Increase Physical Activity;Increase Strength and Stamina;Understanding of Exercise Prescription Increase Physical Activity;Increase Strength and Stamina;Understanding  of Exercise Prescription Increase Physical Activity;Increase Strength and Stamina;Understanding of Exercise Prescription   Comments Brianna Adams is doing well in rehab.  She is still on level 1, but she is now up to 2 METs.  We will continue to encourage her to increase workloads and continue to monitor her progress. Brianna Adams has moved up to level 2 and 3 lb for strength work.  Staff will monitor progress. Brianna Adams is doing well in rehab.  She is now on level 3 for the NuStep.  We will continue to montior her progress. Brianna Adams is doing well.  She is feeling stronger and has more stamina.  She has been using a simple stepper from her daughter for both her arms and legs.  She also does her weights as well.  She tries to get in some exercise everyday except Sunday. Brianna Adams attends consistently and works at [employer].  Oxygen levels stay in 90s most of the time.  Staff will monitor progress.   Expected Outcomes Short: Increase to level 2  Long; COnitnue to improve stamina. Short: continue to increase workloads when able Long: increase overall stamina Short: Continue to increase workloads.  Long: Continue to improve stamina. Short: Continue to exercise on off days Long: Continue to improve stamina. Short: contineu to exercise consistently Long: improve average MET level   Row Name 08/13/20 1010 08/26/20 1009 09/09/20 1627 10/06/20 1557 10/14/20 1119     Exercise Goal Re-Evaluation   Exercise Goals Review Increase Physical Activity;Increase Strength and Stamina;Understanding of Exercise Prescription Increase Physical Activity;Increase Strength and Stamina Increase Physical Activity;Increase Strength and Stamina;Understanding of Exercise Prescription -- Increase Physical Activity;Increase Strength and Stamina   Comments Brianna Adams is doing well in rehab.  She is now on level 5 for the NuStep.  We will continue to monitor her progress. Brianna Adams continues to tolerate exercise well. She maintain oxygen 88% and above on 3L during  exercise. Brianna Adams improved her post 6MWT by 22%!!  She was pleased with her progress.  We will continue to monitor her progress. Out since last review. She was able to use her floor pedal machine to do exercie at home. She was trying to get up and walk when she could also, Now that she is back she is ready to keep her routine at rehab.   Expected Outcomes Short: Increase BioStep to level 3 Long; Continue to improve stamina. Short:  increase levels on machines Long: improve overall stamina Short: Plan for graduation Long: Continue to exercise independently -- Short: attend [name] regularly to imporve stamina. Long: maintain an exercise routine post [name].   Brianna Adams Name 10/21/20 1408             Exercise Goal Re-Evaluation   Exercise Goals Review Increase Physical Activity;Increase Strength and Stamina       Comments Brianna Adams improved her post walk test by 85 feet.  She also only had to rest once during this test compared to 3 the first time.       Expected Outcomes Short; complete LW program Long:  maintain exercise on her own              Nutrition &  Weight - Outcomes:    Nutrition:  Nutrition Therapy & Goals - 06/17/20 1025      Nutrition Therapy   Diet Heart healthy, low Na, pulmonary MNT.    Protein (specify units) 80g    Fiber 25 grams    Whole Grain Foods 3 servings    Saturated Fats 12 max. grams    Fruits and Vegetables 5 servings/day    Sodium 1.5 grams      Personal Nutrition Goals   Nutrition Goal ST: Add vegetable servings LT: 6/7 breathing now, would like to improve SOB and lower O2 - 3L now. Would like to maintain weight.    Comments Brianna Adams likes to eat seafood, pork and chicken. She normally doesn't cook, they go out to eat. [employer], [employer], olive garden, [place] roots, cracker barrel, scrambled Brianna Adams).  2 meals per day- goes out for both meals. 11-12pm brunch. 5-6 dinner. Doesn't eat much eggs or dairy B/L: grits, sausage or turley sausage or biscuits  or [place] sandwich or soup or sometimes salad. D: fish, oysters, shrimp, hamburger steak, pork chops, chicken [place] - usually baked. Paired with rice, baked potato, sweet potato, brussels sprouts, cabbage, greens, salad, coleslaw. Drinks: water, unsweetened iced tea with lemon, diet pepsi or coke with lemon and coffee. Discussed heart healthy eating, pulmonary MNT. Brianna Adams does not want to eat at home, but her daughter Brianna Adams would like to move to doing that.      Intervention Plan   Intervention Prescribe, educate and counsel regarding individualized specific dietary modifications aiming towards targeted core components such as weight, hypertension, lipid management, diabetes, heart failure and other comorbidities.;Nutrition handout(s) given to patient.    Expected Outcomes Short Term Goal: Understand basic principles of dietary content, such as calories, fat, sodium, cholesterol and nutrients.;Short Term Goal: A plan has been developed with personal nutrition goals set during dietitian appointment.;Long Term Goal: Adherence to prescribed nutrition plan.           Nutrition Discharge:  Nutrition Assessments - 10/21/20 1058      MEDFICTS Scores   Pre Score 18    Post Score 21    Score Difference 3           Education Questionnaire Score:  Knowledge Questionnaire Score - 10/21/20 1059      Knowledge Questionnaire Score   Pre Score 9/17    Post Score 18/18           Goals reviewed with patient; copy given to patient.s) given to patient.    Expected Outcomes Short Term Goal: Understand basic principles of dietary content, such as calories, fat, sodium, cholesterol and nutrients.;Short Term Goal: A plan has been developed with personal nutrition goals set during dietitian appointment.;Long Term Goal: Adherence to prescribed nutrition plan.          Goals reviewed with patient; copy given to patient.

## 2020-10-24 ENCOUNTER — Telehealth: Payer: Self-pay | Admitting: Pulmonary Disease

## 2020-10-24 NOTE — Telephone Encounter (Signed)
Brianna Adams is aware of below recommendations. She voiced her understanding and voiced her understanding.  Nothing further needed.

## 2020-10-24 NOTE — Telephone Encounter (Signed)
Noted.  His symptoms worsen recommend going to the emergency room for evaluation.

## 2020-10-24 NOTE — Telephone Encounter (Signed)
Spoke to patient's daughter, Benita(DPR). Benita stated that patient is experiencing increased sob with exertion. Patient is on 3L cont. Oxygen levels have dropped as low at 80% on 3L. She recovered to 92% with rest and pursed lip breathing.  Cough is baseline and non prod.  Denied f/c/s or additional symptoms.  She has not been using albuterol HFA.  She is using anoro once daily. Benita stated that she would have patient use albuterol to see if that helps with symptoms and monitor her over the weekend. If symptoms do not improve, Benita will call back on Monday.   Routing to Dr. [name] as an [place].

## 2020-10-28 ENCOUNTER — Other Ambulatory Visit: Payer: Self-pay

## 2020-10-28 DIAGNOSIS — J439 Emphysema, unspecified: Secondary | ICD-10-CM

## 2020-10-28 NOTE — Progress Notes (Signed)
Pulmonary Individual Treatment Plan  Patient Details  Name: Brianna Adams MRN: [id number] Date of Birth: July 23, 1941 Referring Provider:   Flowsheet Row Pulmonary Rehab from 04/21/2020 in [facility] Cardiac and Pulmonary Rehab  Referring Provider Brianna Adams      Initial Encounter Date:  Flowsheet Row Pulmonary Rehab from 04/21/2020 in [facility] Cardiac and Pulmonary Rehab  Date 04/21/20      Visit Diagnosis: Pulmonary emphysema, unspecified emphysema type ([place])  Patient's Home Medications on Admission:  Current Outpatient Medications:  .  acetaminophen (TYLENOL) 500 MG tablet, Take 1,000 mg by mouth every 6 (six) hours as needed for mild pain or fever. , Disp: , Rfl:  .  albuterol (VENTOLIN HFA) 108 (90 Base) MCG/ACT inhaler, Inhale 2 puffs into the lungs every 6 (six) hours as needed for wheezing or shortness of breath., Disp: 18 g, Rfl: 0 .  calcium carbonate (OSCAL) 1500 (600 Ca) MG TABS tablet, Take 1,200 mg by mouth daily., Disp: , Rfl:  .  Cinnamon 500 MG TABS, Take 1,000 mg by mouth daily. , Disp: , Rfl:  .  ELIQUIS 5 MG TABS tablet, TAKE 1 TABLET BY MOUTH TWICE A DAY, Disp: 60 tablet, Rfl: 2 .  furosemide (LASIX) 20 MG tablet, TAKE 1 TABLET BY MOUTH EVERY DAY (Patient taking differently: Take 20 mg by mouth daily.), Disp: 90 tablet, Rfl: 2 .  metFORMIN (GLUCOPHAGE) 1000 MG tablet, TAKE 1 TABLET (1,000 MG TOTAL) BY MOUTH 2 (TWO) TIMES DAILY WITH A MEAL., Disp: 180 tablet, Rfl: 0 .  Multiple Vitamins-Minerals (CENTRUM SILVER PO), Take 1 tablet by mouth daily. , Disp: , Rfl:  .  Multiple Vitamins-Minerals (OCUVITE EYE HEALTH FORMULA PO), Take 1 capsule by mouth daily., Disp: , Rfl:  .  omeprazole (PRILOSEC) 40 MG capsule, TAKE 1 CAPSULE BY MOUTH EVERY DAY, Disp: 90 capsule, Rfl: 1 .  pravastatin (PRAVACHOL) 10 MG tablet, TAKE 1 TABLET BY MOUTH EVERY DAY, Disp: 90 tablet, Rfl: 1 .  traMADol (ULTRAM) 50 MG tablet, Take 1 tablet (50 mg total) by mouth every 6 (six) hours as needed. (Patient not  taking: Reported on 10/09/2020), Disp: 20 tablet, Rfl: 0 .  umeclidinium-vilanterol (ANORO ELLIPTA) 62.5-25 MCG/INH AEPB, Inhale 1 puff into the lungs daily., Disp: 60 each, Rfl: 5 .  vitamin C (ASCORBIC ACID) 500 MG tablet, Take 500 mg by mouth daily. , Disp: , Rfl:  .  vitamin E 1000 UNIT capsule, Take 1,000 Units by mouth 3 (three) times a week. Takes three times a week, Disp: , Rfl:   Past Medical History: Past Medical History:  Diagnosis Date  . CHF (congestive heart failure) ([place])   . COPD (chronic obstructive pulmonary disease) ([place])   . Diabetes ([place])    type 2  . Diverticulitis   . Dyspnea    On Oxygen 3L via [place]   . GERD (gastroesophageal reflux disease)   . High cholesterol   . Lung cancer ([place])   . PE (pulmonary thromboembolism) ([place]) 02/29/2020  . Pulmonary hypertension ([place])   . Uterine cancer ([place])     Tobacco Use: Social History   Tobacco Use  Smoking Status Former Smoker  . Packs/day: 1.00  . Years: 58.00  . Pack years: 58.00  . Types: Cigarettes  . Quit date: 10/22/2015  . Years since quitting: 5.0  Smokeless Tobacco Never Used    Labs: Recent Review Scientist, physiological    Labs for ITP Cardiac and Pulmonary Rehab Latest Ref Rng & Units 10/04/2019 01/07/2020 02/29/2020 05/13/2020 10/09/2020  Cholestrol 100 - 199 mg/dL 152 149 - 168 -   LDLCALC 0 - 99 mg/dL 80 73 - 94 -   HDL >39 mg/dL 57 61 - 61 -   Trlycerides 0 - 149 mg/dL 76 80 - 67 -   Hemoglobin A1c 4.8 - 5.6 % 6.2(H) 5.8(H) - 6.0(H) 6.8(H)   HCO3 20.0 - 28.0 mmol/L - - 19.0(L) - -   ACIDBASEDEF 0.0 - 2.0 mmol/L - - 4.0(H) - -   O2SAT % - - 99.6 - -       Pulmonary Assessment Scores:  Pulmonary Assessment Scores    Row Name 09/09/20 1136 10/21/20 1055       ADL UCSD   ADL Phase - Exit    SOB Score total - 78    Rest - 2    Walk - 3    Stairs - 4    Bath - 2    Dress - 3    Shop - 3         CAT Score   CAT Score - 22         mMRC Score   mMRC Score 3 -            UCSD: Self-administered rating of dyspnea associated with activities of daily living (ADLs) 6-point scale (0 = "not at all" to 5 = "maximal or unable to do because of breathlessness")  Scoring Scores range from 0 to 120.  Minimally important difference is 5 units  CAT: CAT can identify the health impairment of COPD patients and is better correlated with disease progression.  CAT has a scoring range of zero to 40. The CAT score is classified into four groups of low (less than 10), medium (10 - 20), high (21-30) and very high (31-40) based on the impact level of disease on health status. A CAT score over 10 suggests significant symptoms.  A worsening CAT score could be explained by an exacerbation, poor medication adherence, poor inhaler technique, or progression of COPD or comorbid conditions.  CAT MCID is 2 points  mMRC: mMRC (Modified Medical Research Council) Dyspnea Scale is used to assess the degree of baseline functional disability in patients of respiratory disease due to dyspnea. No minimal important difference is established. A decrease in score of 1 point or greater is considered a positive change.   Pulmonary Function Assessment:   Exercise Target Goals: Exercise Program Goal: Individual exercise prescription set using results from initial 6 min walk test and THRR while considering  patient's activity barriers and safety.   Exercise Prescription Goal: Initial exercise prescription builds to 30-45 minutes a day of aerobic activity, 2-3 days per week.  Home exercise guidelines will be given to patient during program as part of exercise prescription that the participant will acknowledge.  Education: Aerobic Exercise: - Group verbal and visual presentation on the components of exercise prescription. Introduces F.I.T.T principle from ACSM for exercise prescriptions.  Reviews F.I.T.T. principles of aerobic exercise including progression. Written material given at graduation. Flowsheet  Row Cardiac Rehab from 10/16/2020 in [facility] Cardiac and Pulmonary Rehab  Education need identified 07/03/20  Date 09/04/20  Brianna Adams only on 10/28]  Educator AS  Instruction Review Code 1- Verbalizes Understanding      Education: Resistance Exercise: - Group verbal and visual presentation on the components of exercise prescription. Introduces F.I.T.T principle from ACSM for exercise prescriptions  Reviews F.I.T.T. principles of resistance exercise including progression. Written material given at graduation. Flowsheet Row Cardiac  Rehab from 10/16/2020 in [facility] Cardiac and Pulmonary Rehab  Date 08/28/20  Educator [facility]  Instruction Review Code 1- Verbalizes Understanding       Education: Exercise & Equipment Safety: - Individual verbal instruction and demonstration of equipment use and safety with use of the equipment. Flowsheet Row Cardiac Rehab from 10/16/2020 in [facility] Cardiac and Pulmonary Rehab  Date 04/21/20  Educator AS  Instruction Review Code 1- Verbalizes Understanding      Education: Exercise Physiology & General Exercise Guidelines: - Group verbal and written instruction with models to review the exercise physiology of the cardiovascular system and associated critical values. Provides general exercise guidelines with specific guidelines to those with heart or lung disease.  Flowsheet Row Cardiac Rehab from 10/16/2020 in [facility] Cardiac and Pulmonary Rehab  Date 10/16/20  Educator [facility]  Instruction Review Code 1- Verbalizes Understanding      Education: Flexibility, Balance, Mind/Body Relaxation: - Group verbal and visual presentation with interactive activity on the components of exercise prescription. Introduces F.I.T.T principle from ACSM for exercise prescriptions. Reviews F.I.T.T. principles of flexibility and balance exercise training including progression. Also discusses the mind body connection.  Reviews various relaxation techniques to help reduce and manage stress (i.e. Deep  breathing, progressive muscle relaxation, and visualization). Balance handout provided to take home. Written material given at graduation. Flowsheet Row Cardiac Rehab from 10/16/2020 in [facility] Cardiac and Pulmonary Rehab  Date 07/10/20  Educator AS  Instruction Review Code 1- Verbalizes Understanding      Activity Barriers & Risk Stratification:   6 Minute Walk:  6 Minute Walk    Row Name 09/09/20 1129         6 Minute Walk   Phase Discharge     Distance 465 feet     Distance % Change 22 %     Distance Feet Change 85 ft     Walk Time 4.5 minutes     # of Rest Breaks 1     MPH 1.2     METS 1.43     RPE 15     Perceived Dyspnea  2     VO2 Peak 5.01     Symptoms No     Resting HR 82 bpm     Resting BP 122/64     Resting Oxygen Saturation  98 %     Exercise Oxygen Saturation  during 6 min walk 81 %     Max Ex. HR 132 bpm     Max Ex. BP 154/64           Interval HR   1 Minute HR 119     2 Minute HR 121     3 Minute HR 123     4 Minute HR 132     6 Minute HR 90     2 Minute Post HR 105     Interval Heart Rate? Yes           Interval Oxygen   Baseline Oxygen Saturation % 98 %     1 Minute Oxygen Saturation % 88 %     1 Minute Liters of Oxygen 3 L     2 Minute Oxygen Saturation % 87 %     2 Minute Liters of Oxygen 3 L     3 Minute Oxygen Saturation % 84 %     3 Minute Liters of Oxygen 3 L     4 Minute Oxygen Saturation % 82 %     4  Minute Liters of Oxygen 3 L     5 Minute Liters of Oxygen 3 L     6 Minute Oxygen Saturation % 81 %     6 Minute Liters of Oxygen 3 L     2 Minute Post Oxygen Saturation % 92 %     2 Minute Post Liters of Oxygen 3 L           Oxygen Initial Assessment:  Oxygen Initial Assessment - 05/06/20 1152      Home Oxygen   Home Oxygen Device Portable Concentrator;Home Concentrator    Sleep Oxygen Prescription Continuous    Liters per minute 2    Home Exercise Oxygen Prescription None    Home Resting Oxygen Prescription Continuous     Liters per minute -1    Compliance with Home Oxygen Use Yes      Intervention   Short Term Goals To learn and exhibit compliance with exercise, home and travel O2 prescription;To learn and understand importance of monitoring SPO2 with pulse oximeter and demonstrate accurate use of the pulse oximeter.;To learn and understand importance of maintaining oxygen saturations>88%;To learn and demonstrate proper pursed lip breathing techniques or other breathing techniques.;To learn and demonstrate proper use of respiratory medications    Long  Term Goals Verbalizes importance of monitoring SPO2 with pulse oximeter and return demonstration;Exhibits proper breathing techniques, such as pursed lip breathing or other method taught during program session;Maintenance of O2 saturations>88%;Exhibits compliance with exercise, home and travel O2 prescription;Demonstrates proper use of MDI's;Compliance with respiratory medication           Oxygen Re-Evaluation:  Oxygen Re-Evaluation    [place] Name 05/06/20 1153 05/29/20 1137 06/17/20 1136 07/17/20 1116 08/14/20 1116     Program Oxygen Prescription   Program Oxygen Prescription - Continuous;E-Tanks Continuous;E-Tanks Continuous;E-Tanks Continuous   Liters per minute - 3 - 3 3     Home Oxygen   Home Oxygen Device - Home Concentrator;Portable Concentrator;E-Tanks Home Concentrator;Portable Concentrator;E-Tanks Home Concentrator;Portable Concentrator;E-Tanks Home Concentrator;E-Tanks   Sleep Oxygen Prescription - Continuous Continuous Continuous Continuous   Liters per minute - _0 Home Exercise Oxygen Prescription - Continuous Continuous Continuous Continuous   Liters per minute - _1 Home Resting Oxygen Prescription - Continuous Continuous Continuous Continuous   Liters per minute - _2 Compliance with Home Oxygen Use - Yes Yes Yes Yes     Goals/Expected Outcomes   Short Term Goals To learn and demonstrate proper pursed lip breathing  techniques or other breathing techniques. To learn and exhibit compliance with exercise, home and travel O2 prescription;To learn and understand importance of monitoring SPO2 with pulse oximeter and demonstrate accurate use of the pulse oximeter.;To learn and understand importance of maintaining oxygen saturations>88%;To learn and demonstrate proper use of respiratory medications;To learn and demonstrate proper pursed lip breathing techniques or other breathing techniques. To learn and exhibit compliance with exercise, home and travel O2 prescription;To learn and understand importance of monitoring SPO2 with pulse oximeter and demonstrate accurate use of the pulse oximeter.;To learn and understand importance of maintaining oxygen saturations>88%;To learn and demonstrate proper use of respiratory medications;To learn and demonstrate proper pursed lip breathing techniques or other breathing techniques. To learn and exhibit compliance with exercise, home and travel O2 prescription;To learn and understand importance of monitoring SPO2 with pulse oximeter and demonstrate accurate use of the pulse oximeter.;To learn and understand importance of maintaining oxygen saturations>88%;To learn and  demonstrate proper use of respiratory medications;To learn and demonstrate proper pursed lip breathing techniques or other breathing techniques. To learn and understand importance of maintaining oxygen saturations>88%;To learn and exhibit compliance with exercise, home and travel O2 prescription   Long  Term Goals Exhibits proper breathing techniques, such as pursed lip breathing or other method taught during program session Exhibits compliance with exercise, home and travel O2 prescription;Exhibits proper breathing techniques, such as pursed lip breathing or other method taught during program session;Verbalizes importance of monitoring SPO2 with pulse oximeter and return demonstration;Demonstrates proper use of  MDI's;Maintenance of O2 saturations>88%;Compliance with respiratory medication Exhibits compliance with exercise, home and travel O2 prescription;Exhibits proper breathing techniques, such as pursed lip breathing or other method taught during program session;Verbalizes importance of monitoring SPO2 with pulse oximeter and return demonstration;Demonstrates proper use of MDI's;Maintenance of O2 saturations>88%;Compliance with respiratory medication Exhibits compliance with exercise, home and travel O2 prescription;Exhibits proper breathing techniques, such as pursed lip breathing or other method taught during program session;Verbalizes importance of monitoring SPO2 with pulse oximeter and return demonstration;Demonstrates proper use of MDI's;Maintenance of O2 saturations>88%;Compliance with respiratory medication Maintenance of O2 saturations>88%;Exhibits compliance with exercise, home and travel O2 prescription   Comments Reviewed PLB technique with pt.  Talked about how it works and it's importance in maintaining their exercise saturations. Informed patient how to perform the Pursed Lipped breathing technique. Told patient to Inhale through the nose and out the mouth with pursed lips to keep their airways open, help oxygenate them better, practice when at rest or doing strenuous activity. Patient Verbalizes understanding of technique and will work on and be reiterated during [place]. Pt uses PLB when she remembers.  Sh eis feeling like SOB has improved since starting LW program. Thayer is doing well with her oxygen therapy.  She is using her PLB when she remembers.  She has noted that her breathing has been improving since starting the program.  She continues to do well on her respiratory meds too. Devani states that her oxygen tank is heavy and she doesn not bring it to rehab due to it being heavy. She was informed to use it when in the wating room due to her oxygen being low before exercise. Informed her  again that is it improtant to monitor her oxygen and use it when she is out of the house. Informed her to check her oxygen when she if off oxygen running errends and to make sure she is 88 percent and above.   Goals/Expected Outcomes Short: Become more profiecient at using PLB.   Long: Become independent at using PLB. Short: use PLB with exertion. Long: use PLB on exertion proficiently and independently Short: continue to be compliant with O2 and PLB Long: use PLB as needed Short: continue to be compliant with O2 and PLB Long: use PLB as needed Short: use oxygen when she runs errends. Long: Maintain using oxygen when in stores and running errends.   [place] Name 10/14/20 1115             Program Oxygen Prescription   Program Oxygen Prescription Continuous       Liters per minute 3               Home Oxygen   Home Oxygen Device Home Concentrator;E-Tanks       Sleep Oxygen Prescription Continuous       Liters per minute 3       Home Exercise Oxygen Prescription Continuous  Liters per minute 3       Home Resting Oxygen Prescription Continuous       Liters per minute 3       Compliance with Home Oxygen Use Yes               Goals/Expected Outcomes   Short Term Goals To learn and understand importance of maintaining oxygen saturations>88%;To learn and understand importance of monitoring SPO2 with pulse oximeter and demonstrate accurate use of the pulse oximeter.;To learn and exhibit compliance with exercise, home and travel O2 prescription;To learn and demonstrate proper use of respiratory medications       Long  Term Goals Exhibits compliance with exercise, home and travel O2 prescription;Maintenance of O2 saturations>88%;Verbalizes importance of monitoring SPO2 with pulse oximeter and return demonstration;Compliance with respiratory medication;Demonstrates proper use of MDI's       Comments Brianna Adams has been out from a Cyst removed from her wrist. She has been checking her oxygen at home.  She has been using her oxygen on and off when she runs out for errends. She wants to get a lighter portable concentrator. She is going to talk with her insurance about getting a Marine scientist.       Goals/Expected Outcomes Short: ask insuranced about a Marine scientist. Long: get a portable concentrator if approved.              Oxygen Discharge (Final Oxygen Re-Evaluation):  Oxygen Re-Evaluation - 10/14/20 1115      Program Oxygen Prescription   Program Oxygen Prescription Continuous    Liters per minute 3      Home Oxygen   Home Oxygen Device Home Concentrator;E-Tanks    Sleep Oxygen Prescription Continuous    Liters per minute 3    Home Exercise Oxygen Prescription Continuous    Liters per minute 3    Home Resting Oxygen Prescription Continuous    Liters per minute 3    Compliance with Home Oxygen Use Yes      Goals/Expected Outcomes   Short Term Goals To learn and understand importance of maintaining oxygen saturations>88%;To learn and understand importance of monitoring SPO2 with pulse oximeter and demonstrate accurate use of the pulse oximeter.;To learn and exhibit compliance with exercise, home and travel O2 prescription;To learn and demonstrate proper use of respiratory medications    Long  Term Goals Exhibits compliance with exercise, home and travel O2 prescription;Maintenance of O2 saturations>88%;Verbalizes importance of monitoring SPO2 with pulse oximeter and return demonstration;Compliance with respiratory medication;Demonstrates proper use of MDI's    Comments Brianna Adams has been out from a Cyst removed from her wrist. She has been checking her oxygen at home. She has been using her oxygen on and off when she runs out for errends. She wants to get a lighter portable concentrator. She is going to talk with her insurance about getting a Marine scientist.    Goals/Expected Outcomes Short: ask insuranced about a Marine scientist. Long: get a portable  concentrator if approved.           Initial Exercise Prescription:   Perform Capillary Blood Glucose checks as needed.  Exercise Prescription Changes:  Exercise Prescription Changes    Row Name 05/06/20 1500 05/21/20 1000 06/03/20 0900 07/03/20 1100 07/17/20 0700     Response to Exercise   Blood Pressure (Admit) 124/64 122/60 142/74 112/60 112/60   Blood Pressure (Exercise) 128/70 146/64 146/70 126/64 134/70   Blood Pressure (Exit) 122/62 138/70 100/58 128/64 122/62   Heart Rate (Admit)  97 bpm 84 bpm 73 bpm 86 bpm 78 bpm   Heart Rate (Exercise) 100 bpm 114 bpm 108 bpm 109 bpm 100 bpm   Heart Rate (Exit) 90 bpm 95 bpm 82 bpm 87 bpm 90 bpm   Oxygen Saturation (Admit) 89 % 99 % 100 % 93 % 98 %   Oxygen Saturation (Exercise) 96 % 92 % 92 % 92 % 91 %   Oxygen Saturation (Exit) 99 % 98 % 95 % 94 % 97 %   Rating of Perceived Exertion (Exercise) _0 Perceived Dyspnea (Exercise) _1 0   Symptoms _2    Comments first day - - - -   Duration Progress to 30 minutes of  aerobic without signs/symptoms of physical distress Progress to 30 minutes of  aerobic without signs/symptoms of physical distress Progress to 30 minutes of  aerobic without signs/symptoms of physical distress Progress to 30 minutes of  aerobic without signs/symptoms of physical distress Continue with 30 min of aerobic exercise without signs/symptoms of physical distress.   Intensity - THRR unchanged THRR unchanged THRR unchanged THRR unchanged     Progression   Progression Continue to progress workloads to maintain intensity without signs/symptoms of physical distress. Continue to progress workloads to maintain intensity without signs/symptoms of physical distress. Continue to progress workloads to maintain intensity without signs/symptoms of physical distress. Continue to progress workloads to maintain intensity without signs/symptoms of physical distress. Continue to progress workloads to  maintain intensity without signs/symptoms of physical distress.   Average METs 2 2.19 2.32 2.2 2.2     Resistance Training   Training Prescription _3    Weight 2 lb 2 lb 2 lb 3 lb 3 lb   Reps 10-15 10-15 10-15 10-15 10-15     Interval Training   Interval Training _4      Recumbant Bike   Level _5 - -   RPM 60 - - - -   Watts _6 - -   Minutes _7 - -   METs 1 2.38 - - -     NuStep   Level - - _8 SPM - - 80 80 -   Minutes - - _9 METs - - 2.3 2.3 2.6     Arm Ergometer   Level _10 - -   Minutes _11 - -   METs 2 1.9 - - -     REL-XR   Level - - 1 - -   Minutes - - 15 - -   METs - - 3.18 - -     T5 Nustep   Level - - - - 2   Minutes - - - - 30   METs - - - - 2     Biostep-RELP   Level - - - 2 2   Minutes - - - 15 15   METs - - - 2 2     Home Exercise Plan   Plans to continue exercise at - - - - Home (comment)  bike and weights   Frequency - - - - Add 2 additional days to program exercise sessions.   Initial Home Exercises Provided - - - - 06/17/20   Row Name 07/30/20 1200 08/13/20 1000 08/26/20 1000 09/09/20 1600 10/21/20 1400     Response to Exercise  Blood Pressure (Admit) 102/60 136/64 128/62 124/68 130/70   Blood Pressure (Exercise) 134/58 130/66 140/60 136/70 140/64   Blood Pressure (Exit) 108/60 134/70 116/70 118/68 120/62   Heart Rate (Admit) 82 bpm 88 bpm 75 bpm 76 bpm 95 bpm   Heart Rate (Exercise) 102 bpm 97 bpm 107 bpm 90 bpm 113 bpm   Heart Rate (Exit) 89 bpm 91 bpm 93 bpm 90 bpm 92 bpm   Oxygen Saturation (Admit) 99 % 88 % 99 % 90 % 92 %   Oxygen Saturation (Exercise) 93 % 90 % 88 % 91 % 85 %   Oxygen Saturation (Exit) 99 % 99 % 94 % 98 % 98 %   Rating of Perceived Exertion (Exercise) _0 Perceived Dyspnea (Exercise) 0 _1 0   Symptoms _2    Duration Continue with 30 min of aerobic exercise without signs/symptoms of physical distress. Continue with 30  min of aerobic exercise without signs/symptoms of physical distress. Continue with 30 min of aerobic exercise without signs/symptoms of physical distress. Continue with 30 min of aerobic exercise without signs/symptoms of physical distress. Continue with 30 min of aerobic exercise without signs/symptoms of physical distress.   Intensity _3      Progression   Progression Continue to progress workloads to maintain intensity without signs/symptoms of physical distress. Continue to progress workloads to maintain intensity without signs/symptoms of physical distress. Continue to progress workloads to maintain intensity without signs/symptoms of physical distress. Continue to progress workloads to maintain intensity without signs/symptoms of physical distress. Continue to progress workloads to maintain intensity without signs/symptoms of physical distress.   Average METs 2 2.25 2 1.88 2     Resistance Training   Training Prescription _4    Weight 3 lb 3 lb 3 lb 3 lb 3 lb   Reps 10-15 10-15 10-15 10-15 10-15     Interval Training   Interval Training _5      Treadmill   MPH - - - 0.8 -   Grade - - - 0 -   Minutes - - - 15 -   METs - - - 1.6 -     NuStep   Level _6 SPM 80 - 80 - 80   Minutes _7 METs 2 2.5 2 1.9 2     T5 Nustep   Level - - - 1 -   Minutes - - - 15 -   METs - - - 2 -     Biostep-RELP   Level _8 SPM - - 50 - -   Minutes _9 METs _10 Home Exercise Plan   Plans to continue exercise at Home (comment)  bike and weights Home (comment)  bike and weights Home (comment)  bike and weights Home (comment)  bike and weights Home (comment)  bike and weights   Frequency Add 2 additional days to program exercise sessions. Add 2 additional days to program exercise sessions. Add 2 additional days to program exercise sessions. Add 2  additional days to program exercise sessions. Add 2 additional days to program exercise sessions.   Initial Home Exercises Provided 06/17/20 06/17/20 06/17/20 06/17/20 06/17/20  Exercise Comments:  Exercise Comments    Row Name 05/06/20 1152           Exercise Comments First full day of exercise!  Patient was oriented to gym and equipment including functions, settings, policies, and procedures.  Patient's individual exercise prescription and treatment plan were reviewed.  All starting workloads were established based on the results of the 6 minute walk test done at initial orientation visit.  The plan for exercise progression was also introduced and progression will be customized based on patient's performance and goals.              Exercise Goals and Review:   Exercise Goals Re-Evaluation :  Exercise Goals Re-Evaluation    Row Name 05/06/20 1154 05/21/20 1024 05/29/20 1143 06/03/20 0918 06/17/20 1131     Exercise Goal Re-Evaluation   Exercise Goals Review Able to understand and use rate of perceived exertion (RPE) scale;Able to understand and use Dyspnea scale;Knowledge and understanding of Target Heart Rate Range (THRR);Understanding of Exercise Prescription Increase Physical Activity;Increase Strength and Stamina;Understanding of Exercise Prescription Increase Physical Activity Increase Physical Activity;Understanding of Exercise Prescription;Increase Strength and Stamina Increase Physical Activity;Understanding of Exercise Prescription;Increase Strength and Stamina;Able to check pulse independently;Knowledge and understanding of Target Heart Rate Range (THRR);Able to understand and use Dyspnea scale;Able to understand and use rate of perceived exertion (RPE) scale   Comments Reviewed RPE and dyspnea scales, THR and program prescription with pt today.  Pt voiced understanding and was given a copy of goals to take home. Yuli is off to a good start in rehab.  She has  completed her first two full days of exercise. We will continue to monitor her progress. When Lynett is not coming to rehab she is using her pedal bike that sits on the floor. She also uses it for her arms when she puts it on the kitchen counter. She exercises for about 20 minutes at a time. Lillyana is doing well in rehab. She has tried a variety of different machines. She is up to 15 watts on the [employer]. Will continue to monitor her progress. Reviewed home exercise with pt today.  Pt plans to use equipment at home for exercise.  Reviewed THR, pulse, RPE, sign and symptoms, pulse oximetery and when to call 911 or MD.  Also discussed weather considerations and indoor options.  Pt voiced understanding.   Expected Outcomes Short: Use RPE daily to regulate intensity. Long: Follow program prescription in THR. Short: Return to regular attendance Long; Continue to follow program prescription Short: continue to increase workout activity at home. Long: maintain home exercises independently Short: Continue to increase workloads Long: Continue to increase strength/stamina Short: monitor HR and O2 when exercising at home Long: maintain exercise after completing [place] Name 06/19/20 0723 07/03/20 1150 07/17/20 0728 07/17/20 1106 07/30/20 1225     Exercise Goal Re-Evaluation   Exercise Goals Review Increase Physical Activity;Increase Strength and Stamina;Understanding of Exercise Prescription Increase Physical Activity;Increase Strength and Stamina Increase Physical Activity;Increase Strength and Stamina;Understanding of Exercise Prescription Increase Physical Activity;Increase Strength and Stamina;Understanding of Exercise Prescription Increase Physical Activity;Increase Strength and Stamina;Understanding of Exercise Prescription   Comments Brianna Adams is doing well in rehab.  She is still on level 1, but she is now up to 2 METs.  We will continue to encourage her to increase workloads and continue to monitor her  progress. Brianna Adams has moved up to level 2 and 3 lb for strength work.  Staff will monitor  progress. Brianna Adams is doing well in rehab.  She is now on level 3 for the NuStep.  We will continue to montior her progress. Brianna Adams is doing well.  She is feeling stronger and has more stamina.  She has been using a simple stepper from her daughter for both her arms and legs.  She also does her weights as well.  She tries to get in some exercise everyday except Sunday. Brianna Adams attends consistently and works at [employer].  Oxygen levels stay in 90s most of the time.  Staff will monitor progress.   Expected Outcomes Short: Increase to level 2  Long; COnitnue to improve stamina. Short: continue to increase workloads when able Long: increase overall stamina Short: Continue to increase workloads.  Long: Continue to improve stamina. Short: Continue to exercise on off days Long: Continue to improve stamina. Short: contineu to exercise consistently Long: improve average MET level   Row Name 08/13/20 1010 08/26/20 1009 09/09/20 1627 10/06/20 1557 10/14/20 1119     Exercise Goal Re-Evaluation   Exercise Goals Review Increase Physical Activity;Increase Strength and Stamina;Understanding of Exercise Prescription Increase Physical Activity;Increase Strength and Stamina Increase Physical Activity;Increase Strength and Stamina;Understanding of Exercise Prescription - Increase Physical Activity;Increase Strength and Stamina   Comments Brianna Adams is doing well in rehab.  She is now on level 5 for the NuStep.  We will continue to monitor her progress. Brianna Adams continues to tolerate exercise well. She maintain oxygen 88% and above on 3L during exercise. Brianna Adams improved her post 6MWT by 22%!!  She was pleased with her progress.  We will continue to monitor her progress. Out since last review. She was able to use her floor pedal machine to do exercie at home. She was trying to get up and walk when she could also, Now that she is back she is ready  to keep her routine at rehab.   Expected Outcomes Short: Increase BioStep to level 3 Long; Continue to improve stamina. Short:  increase levels on machines Long: improve overall stamina Short: Plan for graduation Long: Continue to exercise independently - Short: attend [name] regularly to imporve stamina. Long: maintain an exercise routine post [name].   [place] Name 10/21/20 1408             Exercise Goal Re-Evaluation   Exercise Goals Review Increase Physical Activity;Increase Strength and Stamina       Comments Brianna Adams improved her post walk test by 85 feet.  She also only had to rest once during this test compared to 3 the first time.       Expected Outcomes Short; complete LW program Long:  maintain exercise on her own              Discharge Exercise Prescription (Final Exercise Prescription Changes):  Exercise Prescription Changes - 10/21/20 1400      Response to Exercise   Blood Pressure (Admit) 130/70    Blood Pressure (Exercise) 140/64    Blood Pressure (Exit) 120/62    Heart Rate (Admit) 95 bpm    Heart Rate (Exercise) 113 bpm    Heart Rate (Exit) 92 bpm    Oxygen Saturation (Admit) 92 %    Oxygen Saturation (Exercise) 85 %    Oxygen Saturation (Exit) 98 %    Rating of Perceived Exertion (Exercise) 13    Perceived Dyspnea (Exercise) 0    Symptoms none    Duration Continue with 30 min of aerobic exercise without signs/symptoms of physical distress.    Intensity  THRR unchanged      Progression   Progression Continue to progress workloads to maintain intensity without signs/symptoms of physical distress.    Average METs 2      Resistance Training   Training Prescription Yes    Weight 3 lb    Reps 10-15      Interval Training   Interval Training No      NuStep   Level 5    SPM 80    Minutes 15    METs 2      Biostep-RELP   Level 3    Minutes 15    METs 2      Home Exercise Plan   Plans to continue exercise at Home (comment)   bike and weights    Frequency Add 2 additional days to program exercise sessions.    Initial Home Exercises Provided 06/17/20           Nutrition:  Target Goals: Understanding of nutrition guidelines, daily intake of sodium <1546m, cholesterol <2062m calories 30% from fat and 7% or less from saturated fats, daily to have 5 or more servings of fruits and vegetables.  Education: All About Nutrition: -Group instruction provided by verbal, written material, interactive activities, discussions, models, and posters to present general guidelines for heart healthy nutrition including fat, fiber, MyPlate, the role of sodium in heart healthy nutrition, utilization of the nutrition label, and utilization of this knowledge for meal planning. Follow up email sent as well. Written material given at graduation. [facility]  Date 07/17/20  Educator [facility] Inc[facility]ruction Review Code 1- Verbalizes Understanding      Biometrics:    Nutrition Therapy Plan and Nutrition Goals:  Nutrition Therapy & Goals - 06/17/20 1025      Nutrition Therapy   Diet Heart healthy, low Na, pulmonary MNT.    Protein (specify units) 80g    Fiber 25 grams    Whole Grain Foods 3 servings    Saturated Fats 12 max. grams    Fruits and Vegetables 5 servings/day    Sodium 1.5 grams      Personal Nutrition Goals   Nutrition Goal ST: Add vegetable servings LT: 6/7 breathing now, would like to improve SOB and lower O2 - 3L now. Would like to maintain weight.    Comments Brianna Adams likes to eat seafood, pork and chicken. She normally doesn't cook, they go out to eat. [employer], olive garden, so[place]aguayoots, cracker barrel, scrambled (GMulat  2 meals per day- goes out for both meals. 11-12pm brunch. 5-6 dinner. Doesn't eat much eggs or dairy B/L: grits, sausage or turley sausage or biscuits or tu[place]andwich or soup or sometimes salad. D: fish, oysters, shrimp, hamburger  steak, pork chops, chicken tu[place] usually baked. Paired with rice, baked potato, sweet potato, brussels sprouts, cabbage, greens, salad, coleslaw. Drinks: water, unsweetened iced tea with lemon, diet pepsi or coke with lemon and coffee. Discussed heart healthy eating, pulmonary MNT. Brianna Adams does not want to eat at home, but her daughter BeLavella Hammockould like to move to doing that.      Intervention Plan   Intervention Prescribe, educate and counsel regarding individualized specific dietary modifications aiming towards targeted core components such as weight, hypertension, lipid management, diabetes, heart failure and other comorbidities.;Nutrition handout(s) given to patient.    Expected Outcomes Short Term Goal: Understand basic principles of dietary content, such as calories, fat, sodium, cholesterol and nutrients.;Short  Term Goal: A plan has been developed with personal nutrition goals set during dietitian appointment.;Long Term Goal: Adherence to prescribed nutrition plan.           Nutrition Assessments:  Nutrition Assessments - 10/21/20 1058      MEDFICTS Scores   Pre Score 18    Post Score 21    Score Difference 3          MEDIFICTS Score Key:  ?70 Need to make dietary changes   40-70 Heart Healthy Diet  ? 40 Therapeutic Level Cholesterol Diet   Picture Your Plate Scores:  <99 Unhealthy dietary pattern with much room for improvement.  41-50 Dietary pattern unlikely to meet recommendations for good health and room for improvement.  51-60 More healthful dietary pattern, with some room for improvement.   >60 Healthy dietary pattern, although there may be some specific behaviors that could be improved.   Nutrition Goals Re-Evaluation:  Nutrition Goals Re-Evaluation    [place] Name 07/17/20 1112 08/14/20 1123 10/14/20 1125         Goals   Current Weight - 146 lb (66.2 kg) 144 lb (65.3 kg)     Nutrition Goal Add in vegetables and maintain weight Maintain weight. Make  healthier eating choices. maintain weight.     Comment Brianna Adams is doing well with her diet.  She has tried to add in vegetables but not sure how well she is really doing with it.  Her daughter doesn't like to cook and she does not want to cook any more.  They eat out a lot and is limited in her selection. We talked about ordering salads.  Her favorite is cole slaw and brussell sprouts.  We talked about trying the steamables and saving half for another day. Brianna Adams has no questions about her diet and tries to maintain her weight. Patient has no questions or concerns about her current diet.     Expected Outcome Short: Try frozen brussel sprouts and ordering salads  Long; Continue to add in more vegetables. Short: Continue to make healthy eating choices. Long: maintain weight independently Short: Continue to make healthy eating choices. Long: maintain weight independently            Nutrition Goals Discharge (Final Nutrition Goals Re-Evaluation):  Nutrition Goals Re-Evaluation - 10/14/20 1125      Goals   Current Weight 144 lb (65.3 kg)    Nutrition Goal maintain weight.    Comment Patient has no questions or concerns about her current diet.    Expected Outcome Short: Continue to make healthy eating choices. Long: maintain weight independently           Psychosocial: Target Goals: Acknowledge presence or absence of significant depression and/or stress, maximize coping skills, provide positive support system. Participant is able to verbalize types and ability to use techniques and skills needed for reducing stress and depression.   Education: Stress, Anxiety, and Depression - Group verbal and visual presentation to define topics covered.  Reviews how body is impacted by stress, anxiety, and depression.  Also discusses healthy ways to reduce stress and to treat/manage anxiety and depression.  Written material given at graduation. Flowsheet Row Cardiac Rehab from 10/16/2020 in [facility] Cardiac and  Pulmonary Rehab  Date 08/14/20  Educator SB  Instruction Review Code 1- [employer] Understanding      Education: Sleep Hygiene -Provides group verbal and written instruction about how sleep can affect your health.  Define sleep hygiene, discuss sleep cycles and impact of  sleep habits. Review good sleep hygiene tips.    Initial Review & Psychosocial Screening:   Quality of Life Scores:  Scores of 19 and below usually indicate a poorer quality of life in these areas.  A difference of  2-3 points is a clinically meaningful difference.  A difference of 2-3 points in the total score of the Quality of Life Index has been associated with significant improvement in overall quality of life, self-image, physical symptoms, and general health in studies assessing change in quality of life.  PHQ-9: Recent Review Flowsheet Data    Depression screen [facility] 2/9 10/21/2020 10/09/2020 04/21/2020 10/04/2019 10/04/2019   Decreased Interest 0 0 0 0 0   Down, Depressed, Hopeless 0 0 0 0 0   PHQ - 2 Score 0 0 0 0 0   Altered sleeping 0 - 0 0 -   Tired, decreased energy 0 - 0 0 -   Change in appetite 0 - 0 0 -   Feeling bad or failure about yourself  0 - 0 0 -   Trouble concentrating 0 - 0 0 -   Moving slowly or fidgety/restless 0 - 0 0 -   Suicidal thoughts 0 - 0 0 -   PHQ-9 Score 0 - 0 0 -   Difficult doing work/chores Not difficult at all - Not difficult at all Not difficult at all -     Interpretation of Total Score  Total Score Depression Severity:  1-4 = Minimal depression, 5-9 = Mild depression, 10-14 = Moderate depression, 15-19 = Moderately severe depression, 20-27 = Severe depression   Psychosocial Evaluation and Intervention:   Psychosocial Re-Evaluation:  Psychosocial Re-Evaluation    Row Name 05/29/20 1145 06/17/20 1135 07/17/20 1109 08/14/20 1122 10/14/20 1127     Psychosocial Re-Evaluation   Current issues with None Identified - Current Stress Concerns None Identified None Identified    Comments Patient reports no issues with their current mental states, sleep, stress, depression or anxiety. Will follow up with patient in a few weeks for any changes. No chaneg or concerns with sleep, stress, etc. Aariel is doing well in rehab. She denies any major stressors other than not always able to do what she wants because of her breathing.  She sleeps well, no problems. Patient reports no issues with their current mental states, sleep, stress, depression or anxiety. Will follow up with patient in a few weeks for any changes. Patient reports no issues with their current mental states, sleep, stress, depression or anxiety. Will follow up with patient in a few weeks for any changes.   Expected Outcomes Short: Continue to exercise regularly to support mental health and notify staff of any changes. Long: maintain mental health and well being through teaching of rehab or prescribed medications independently. Short: Continue to exercise regularly to support mental health and notify staff of any changes. Long: maintain mental health and well being through teaching of rehab or prescribed medications independently. short: Continue to attend rehab to build breathing and stamina Long: Continue to stay positive Short: Continue to exercise regularly to support mental health and notify staff of any changes. Long: maintain mental health and well being through teaching of rehab or prescribed medications independently. Short: Continue to exercise regularly to support mental health and notify staff of any changes. Long: maintain mental health and well being through teaching of rehab or prescribed medications independently.   Interventions Encouraged to attend Pulmonary Rehabilitation for the exercise - Encouraged to attend Pulmonary Rehabilitation for  the exercise Encouraged to attend Pulmonary Rehabilitation for the exercise Encouraged to attend Pulmonary Rehabilitation for the exercise   Continue Psychosocial Services   Follow up required by staff - - Follow up required by staff Follow up required by staff          Psychosocial Discharge (Final Psychosocial Re-Evaluation):  Psychosocial Re-Evaluation - 10/14/20 1127      Psychosocial Re-Evaluation   Current issues with None Identified    Comments Patient reports no issues with their current mental states, sleep, stress, depression or anxiety. Will follow up with patient in a few weeks for any changes.    Expected Outcomes Short: Continue to exercise regularly to support mental health and notify staff of any changes. Long: maintain mental health and well being through teaching of rehab or prescribed medications independently.    Interventions Encouraged to attend Pulmonary Rehabilitation for the exercise    Continue Psychosocial Services  Follow up required by staff           Education: Education Goals: Education classes will be provided on a weekly basis, covering required topics. Participant will state understanding/return demonstration of topics presented.  Learning Barriers/Preferences:   General Pulmonary Education Topics:  Infection Prevention: - Provides verbal and written material to individual with discussion of infection control including proper hand washing and proper equipment cleaning during exercise session. Flowsheet Row Cardiac Rehab from 10/16/2020 in [facility] Cardiac and Pulmonary Rehab  Date 04/21/20  Educator AS  Instruction Review Code 1- Verbalizes Understanding      Falls Prevention: - Provides verbal and written material to individual with discussion of falls prevention and safety. Flowsheet Row Cardiac Rehab from 10/16/2020 in [facility] Cardiac and Pulmonary Rehab  Date 04/21/20  Educator AS  Instruction Review Code 1- Verbalizes Understanding      Chronic Lung Disease Review: - Group verbal instruction with posters, models, PowerPoint presentations and videos,  to review new updates, new respiratory medications, new  advancements in procedures and treatments. Providing information on websites and "800" numbers for continued self-education. Includes information about supplement oxygen, available portable oxygen systems, continuous and intermittent flow rates, oxygen safety, concentrators, and Medicare reimbursement for oxygen. Explanation of Pulmonary Drugs, including class, frequency, complications, importance of spacers, rinsing mouth after steroid MDI's, and proper cleaning methods for nebulizers. Review of basic lung anatomy and physiology related to function, structure, and complications of lung disease. Review of risk factors. Discussion about methods for diagnosing sleep apnea and types of masks and machines for OSA. Includes a review of the use of types of environmental controls: home humidity, furnaces, filters, dust mite/pet prevention, HEPA vacuums. Discussion about weather changes, air quality and the benefits of nasal washing. Instruction on Warning signs, infection symptoms, calling MD promptly, preventive modes, and value of vaccinations. Review of effective airway clearance, coughing and/or vibration techniques. Emphasizing that all should Create an Action Plan. Written material given at graduation. Flowsheet Row Cardiac Rehab from 10/16/2020 in [facility] Cardiac and Pulmonary Rehab  Date 08/07/20  Educator [facility]  Instruction Review Code 1- Verbalizes Understanding      AED/CPR: - Group verbal and written instruction with the use of models to demonstrate the basic use of the AED with the basic ABC's of resuscitation.    Anatomy and Cardiac Procedures: - Group verbal and visual presentation and models provide information about basic cardiac anatomy and function. Reviews the testing methods done to diagnose heart disease and the outcomes of the test results. Describes the treatment choices: Medical Management,  Angioplasty, or Coronary Bypass Surgery for treating various heart conditions including Myocardial  Infarction, Angina, Valve Disease, and Cardiac Arrhythmias.  Written material given at graduation. Flowsheet Row Cardiac Rehab from 10/16/2020 in [facility] Cardiac and Pulmonary Rehab  Date 08/28/20  Educator [facility]  Instruction Review Code 1- Verbalizes Understanding      Medication Safety: - Group verbal and visual instruction to review commonly prescribed medications for heart and lung disease. Reviews the medication, class of the drug, and side effects. Includes the steps to properly store meds and maintain the prescription regimen.  Written material given at graduation. Flowsheet Row Cardiac Rehab from 10/16/2020 in [facility] Cardiac and Pulmonary Rehab  Date 07/24/20  Educator [facility]  Instruction Review Code 1- Verbalizes Understanding      Other: -Provides group and verbal instruction on various topics (see comments)   Knowledge Questionnaire Score:  Knowledge Questionnaire Score - 10/21/20 1059      Knowledge Questionnaire Score   Pre Score 9/17    Post Score 18/18            Core Components/Risk Factors/Patient Goals at Admission:   Education:Diabetes - Individual verbal and written instruction to review signs/symptoms of diabetes, desired ranges of glucose level fasting, after meals and with exercise. Acknowledge that pre and post exercise glucose checks will be done for 3 sessions at entry of program.   Know Your Numbers and Heart Failure: - Group verbal and visual instruction to discuss disease risk factors for cardiac and pulmonary disease and treatment options.  Reviews associated critical values for Overweight/Obesity, Hypertension, Cholesterol, and Diabetes.  Discusses basics of heart failure: signs/symptoms and treatments.  Introduces Heart Failure Zone chart for action plan for heart failure.  Written material given at graduation.   Core Components/Risk Factors/Patient Goals Review:   Goals and Risk Factor Review    Row Name 05/29/20 1141 06/17/20 1133 07/17/20 1111  08/14/20 1121 10/14/20 1122     Core Components/Risk Factors/Patient Goals Review   Personal Goals Review Weight Management/Obesity;Improve shortness of breath with ADL's Weight Management/Obesity;Improve shortness of breath with ADL's Weight Management/Obesity;Improve shortness of breath with ADL's Improve shortness of breath with ADL's Weight Management/Obesity;Improve shortness of breath with ADL's   Review Spoke to patient about their shortness of breath and what they can do to improve. Patient has been informed of breathing techniques when starting the program. Patient is informed to tell staff if they have had any med changes and that certain meds they are taking or not taking can be causing shortness of breath. Brianna Adams is taking meds as directed.  She feels her shortness of breath has improved.  She can walk further without getting out of breath.  She uses PLB when she remembers. Laterica is doing well in rehab. Her weight is pretty steady between 145-150 lb.  She does not like it when it is up to 150 lb.  Her breathing is still limiting her ability to do things but it has improved since starting rehab.  She continues to use the PLB at home when she remembers. Spoke to patient about their shortness of breath and what they can do to improve. Patient has been informed of breathing techniques when starting the program. Patient is informed to tell staff if they have had any med changes and that certain meds they are taking or not taking can be causing shortness of breath. Brianna Adams still wants to maintain her weight since she has been out. Her shortness of breath at home has not been  as bad.  She wants to be able to keep doing things at home without becoming short of breath.   Expected Outcomes Short: Attend [name] regularly to improve shortness of breath with ADL's. Long: maintain independence with ADL's Short:  continue to exercise Long: continue to improve SOB with ADLs Short: Continue to maintain weight   Long; Continue to improve SOB Short: Attend [name] regularly to improve shortness of breath with ADL's. Long: maintain independence with ADL's Short: continue rehab regularly to improve Work of breathing. Long: maintain ADL's at home independently.          Core Components/Risk Factors/Patient Goals at Discharge (Final Review):   Goals and Risk Factor Review - 10/14/20 1122      Core Components/Risk Factors/Patient Goals Review   Personal Goals Review Weight Management/Obesity;Improve shortness of breath with ADL's    Review Brianna Adams still wants to maintain her weight since she has been out. Her shortness of breath at home has not been as bad.  She wants to be able to keep doing things at home without becoming short of breath.    Expected Outcomes Short: continue rehab regularly to improve Work of breathing. Long: maintain ADL's at home independently.           ITP Comments:  ITP Comments    Row Name 05/06/20 1152 05/14/20 1606 06/11/20 0557 07/09/20 0656 08/06/20 1020   ITP Comments First full day of exercise!  Patient was oriented to gym and equipment including functions, settings, policies, and procedures.  Patient's individual exercise prescription and treatment plan were reviewed.  All starting workloads were established based on the results of the 6 minute walk test done at initial orientation visit.  The plan for exercise progression was also introduced and progression will be customized based on patient's performance and goals. 30 day review completed. ITP sent to Dr. Emily Filbert, Medical Director of Cardiac and Pulmonary Rehab. Continue with ITP unless changes are made by physician. 30 Day review completed. Medical Director ITP review done, changes made as directed, and signed approval by Medical Director. 30 Day review completed. Medical Director ITP review done, changes made as directed, and signed approval by Medical Director. 30 Day review completed. Medical Director ITP review  done, changes made as directed, and signed approval by Medical Director.   [place] Name 09/03/20 0604 09/29/20 1306 09/30/20 1215 10/01/20 0926 10/06/20 1556   ITP Comments 30 Day review completed. Medical Director ITP review done, changes made as directed, and signed approval by Medical Director. Dr. Clance Boll faxed correspondence stating that Brianna Adams may return to  cardiac/pulm rehab on 09/30/20. She is to continue wearing her brace and not to use the left hand/no weight lifting. Contact office if any questions [phone] Brianna Adams has been out due to ganglion cyst removal, she has been cleared to come back 1/25 with limitations. Unable to get goals this round. 30 Day review completed. Medical Director ITP review done, changes made as directed, and signed approval by Medical Director. Georjean is cleared to return to rehab, but not planning to return until 10/14/20.   [place] Name 10/28/20 1133           ITP Comments Michaele graduated today from  rehab with 36 sessions completed.  Details of the patient's exercise prescription and what She needs to do in order to continue the prescription and progress were discussed with patient.  Patient was given a copy of prescription and goals.  Patient verbalized understanding.  Taleen plans to continue  to exercise by using weights and stationary bike at home.              Comments: discharge ITP

## 2020-10-28 NOTE — Progress Notes (Signed)
Daily Session Note  Patient Details  Name: Brianna Adams MRN: [id number] Date of Birth: [birth year] Referring Provider:   Flowsheet Row Pulmonary Rehab from 04/21/2020 in [facility] Cardiac [facility] Pul[facility] Rehab  Referring Provider Brianna Adams      Encounter Date: 10/28/2020  Check In:  Session Check In - 10/28/20 1131      Check-In   Supervising physician immediately available to respond to emergencies See telemetry face sheet for immediately available ER MD    Location [facility]-Cardiac & Pulmonary Rehab    Staff Present Birdie Sons, MPA, RN;Melissa Caiola RDN, Rowe Pavy, BA, ACSM CEP, Exercise Physiologist;Joseph Tessie Fass RCP,RRT,BSRT    Virtual Visit No    Medication changes reported     No    Fall or balance concerns reported    No    Warm-up and Cool-down Performed on first and last piece of equipment    Resistance Training Performed Yes    VAD Patient? No    PAD/SET Patient? No      Pain Assessment   Currently in Pain? No/denies              Social History   Tobacco Use  Smoking Status Former Smoker  . Packs/day: 1.00  . Years: 58.00  . Pack years: 58.00  . Types: Cigarettes  . Quit date: 10/22/2015  . Years since quitting: 5.0  Smokeless Tobacco Never Used    Goals Met:  Independence with exercise equipment Exercise tolerated well No report of cardiac concerns or symptoms Strength training completed today  Goals Unmet:  Not Applicable  Comments:  Brianna Adams graduated today from  rehab with 36 sessions completed.  Details of the patient's exercise prescription and what She needs to do in order to continue the prescription and progress were discussed with patient.  Patient was given a copy of prescription and goals.  Patient verbalized understanding.  Brianna Adams plans to continue to exercise by using weights and stationary bike at home.    Dr. Emily Adams is Medical Director for [facility] and [name].

## 2020-10-28 NOTE — Progress Notes (Signed)
Discharge Progress Report  Patient Details  Name: Brianna Adams MRN: [id number] Date of Birth: August 21, 1941 Referring Provider:   Flowsheet Row Pulmonary Rehab from 04/21/2020 in  [facility] Cardiac and Pulmonary Rehab  Referring Provider Brianna Adams       Number of Visits: 58   Reason for Discharge:  Patient reached a stable level of exercise. Patient independent in their exercise. Patient has met program and personal goals.  Smoking History:  Social History   Tobacco Use  Smoking Status Former Smoker  . Packs/day: 1.00  . Years: 58.00  . Pack years: 58.00  . Types: Cigarettes  . Quit date: 10/22/2015  . Years since quitting: 5.0  Smokeless Tobacco Never Used    Diagnosis:  Pulmonary emphysema, unspecified emphysema type ([place])  ADL UCSD:  Pulmonary Assessment Scores    Row Name 09/09/20 1136 10/21/20 1055       ADL UCSD   ADL Phase -- Exit    SOB Score total -- 78    Rest -- 2    Walk -- 3    Stairs -- 4    Bath -- 2    Dress -- 3    Shop -- 3         CAT Score   CAT Score -- 22         mMRC Score   mMRC Score 3 --           Initial Exercise Prescription:   Discharge Exercise Prescription (Final Exercise Prescription Changes):  Exercise Prescription Changes - 10/21/20 1400      Response to Exercise   Blood Pressure (Admit) 130/70    Blood Pressure (Exercise) 140/64    Blood Pressure (Exit) 120/62    Heart Rate (Admit) 95 bpm    Heart Rate (Exercise) 113 bpm    Heart Rate (Exit) 92 bpm    Oxygen Saturation (Admit) 92 %    Oxygen Saturation (Exercise) 85 %    Oxygen Saturation (Exit) 98 %    Rating of Perceived Exertion (Exercise) 13    Perceived Dyspnea (Exercise) 0    Symptoms none    Duration Continue with 30 min of aerobic exercise without signs/symptoms of physical distress.    Intensity THRR unchanged      Progression   Progression Continue to progress workloads to maintain intensity without signs/symptoms of physical distress.    Average  METs 2      Resistance Training   Training Prescription Yes    Weight 3 lb    Reps 10-15      Interval Training   Interval Training No      NuStep   Level 5    SPM 80    Minutes 15    METs 2      Biostep-RELP   Level 3    Minutes 15    METs 2      Home Exercise Plan   Plans to continue exercise at Home (comment)   bike and weights   Frequency Add 2 additional days to program exercise sessions.    Initial Home Exercises Provided 06/17/20           Functional Capacity:  6 Minute Walk    Row Name 09/09/20 1129         6 Minute Walk   Phase Discharge     Distance 465 feet     Distance % Change 22 %     Distance Feet Change 85 ft  Walk Time 4.5 minutes     # of Rest Breaks 1     MPH 1.2     METS 1.43     RPE 15     Perceived Dyspnea  2     VO2 Peak 5.01     Symptoms No     Resting HR 82 bpm     Resting BP 122/64     Resting Oxygen Saturation  98 %     Exercise Oxygen Saturation  during 6 min walk 81 %     Max Ex. HR 132 bpm     Max Ex. BP 154/64           Interval HR   1 Minute HR 119     2 Minute HR 121     3 Minute HR 123     4 Minute HR 132     6 Minute HR 90     2 Minute Post HR 105     Interval Heart Rate? Yes           Interval Oxygen   Baseline Oxygen Saturation % 98 %     1 Minute Oxygen Saturation % 88 %     1 Minute Liters of Oxygen 3 L     2 Minute Oxygen Saturation % 87 %     2 Minute Liters of Oxygen 3 L     3 Minute Oxygen Saturation % 84 %     3 Minute Liters of Oxygen 3 L     4 Minute Oxygen Saturation % 82 %     4 Minute Liters of Oxygen 3 L     5 Minute Liters of Oxygen 3 L     6 Minute Oxygen Saturation % 81 %     6 Minute Liters of Oxygen 3 L     2 Minute Post Oxygen Saturation % 92 %     2 Minute Post Liters of Oxygen 3 L            Psychological, QOL, Others - Outcomes: PHQ 2/9: Depression screen [facility] 2/9 10/21/2020 10/09/2020 04/21/2020 10/04/2019 10/04/2019  Decreased Interest 0 0 0 0 0  Down, Depressed,  Hopeless 0 0 0 0 0  PHQ - 2 Score 0 0 0 0 0  Altered sleeping 0 - 0 0 -  Tired, decreased energy 0 - 0 0 -  Change in appetite 0 - 0 0 -  Feeling bad or failure about yourself  0 - 0 0 -  Trouble concentrating 0 - 0 0 -  Moving slowly or fidgety/restless 0 - 0 0 -  Suicidal thoughts 0 - 0 0 -  PHQ-9 Score 0 - 0 0 -  Difficult doing work/chores Not difficult at all - Not difficult at all Not difficult at all -    Quality of Life:   Nutrition & Weight - Outcomes:    Nutrition:  Nutrition Therapy & Goals - 06/17/20 1025      Nutrition Therapy   Diet Heart healthy, low Na, pulmonary MNT.    Protein (specify units) 80g    Fiber 25 grams    Whole Grain Foods 3 servings    Saturated Fats 12 max. grams    Fruits and Vegetables 5 servings/day    Sodium 1.5 grams      Personal Nutrition Goals   Nutrition Goal ST: Add vegetable servings LT: 6/7 breathing now, would like to improve SOB and lower O2 - 3L  now. Would like to maintain weight.    Comments Brianna Adams likes to eat seafood, pork and chicken. She normally doesn't cook, they go out to eat. [employer], [employer], olive garden, [place] roots, cracker barrel, scrambled [address]).  2 meals per day- goes out for both meals. 11-12pm brunch. 5-6 dinner. Doesn't eat much eggs or dairy B/L: grits, sausage or turley sausage or biscuits or [place] sandwich or soup or sometimes salad. D: fish, oysters, shrimp, hamburger steak, pork chops, chicken [place] - usually baked. Paired with rice, baked potato, sweet potato, brussels sprouts, cabbage, greens, salad, coleslaw. Drinks: water, unsweetened iced tea with lemon, diet pepsi or coke with lemon and coffee. Discussed heart healthy eating, pulmonary MNT. Brianna Adams does not want to eat at home, but her daughter Brianna Adams would like to move to doing that.      Intervention Plan   Intervention Prescribe, educate and counsel regarding individualized specific dietary modifications aiming towards targeted core  components such as weight, hypertension, lipid management, diabetes, heart failure and other comorbidities.;Nutrition handout(s) given to patient.    Expected Outcomes Short Term Goal: Understand basic principles of dietary content, such as calories, fat, sodium, cholesterol and nutrients.;Short Term Goal: A plan has been developed with personal nutrition goals set during dietitian appointment.;Long Term Goal: Adherence to prescribed nutrition plan.           Nutrition Discharge:  Nutrition Assessments - 10/21/20 1058      MEDFICTS Scores   Pre Score 18    Post Score 21    Score Difference 3           Education Questionnaire Score:  Knowledge Questionnaire Score - 10/21/20 1059      Knowledge Questionnaire Score   Pre Score 9/17    Post Score 18/18           Goals reviewed with patient; copy given to patient.

## 2020-11-03 NOTE — Progress Notes (Signed)
Noted , thank you for your great work with her.

## 2020-11-18 ENCOUNTER — Other Ambulatory Visit: Payer: Self-pay | Admitting: Nurse Practitioner

## 2020-11-27 ENCOUNTER — Telehealth: Payer: Self-pay | Admitting: Pulmonary Disease

## 2020-11-27 NOTE — Telephone Encounter (Signed)
Patient's daughter, Benita(DPR) is aware of recommendations and voiced her understanding.  Nothing further needed.

## 2020-11-27 NOTE — Telephone Encounter (Signed)
Since Brianna Adams has a very complex history and has issues with pulmonary hypertension I recommend she gets seen at the emergency room as she may need admission for management.

## 2020-11-27 NOTE — Telephone Encounter (Signed)
Spoke to patient's daughter, Benita(dpr). Benita stated that patient is experiencing increased sob with exertion and at times at rest.  Wheezing and prod cough is baseline. She is producing clear sputum. Benita has increased oxygen to 4L and patient still has labored breathing. Patient is not monitoring oxygen levels.  She is using albuterol BID and Anoro daily with no relief in sx.  Dr. [name], please advise. Thanks

## 2020-12-09 ENCOUNTER — Emergency Department (HOSPITAL_COMMUNITY): Payer: Medicare Other

## 2020-12-09 ENCOUNTER — Observation Stay (HOSPITAL_COMMUNITY)
Admission: EM | Admit: 2020-12-09 | Discharge: 2020-12-10 | Disposition: A | Discharge status: Home / Self Care | Payer: Medicare Other | Attending: Internal Medicine | Admitting: Internal Medicine

## 2020-12-09 ENCOUNTER — Other Ambulatory Visit: Payer: Self-pay

## 2020-12-09 ENCOUNTER — Encounter (HOSPITAL_COMMUNITY): Payer: Self-pay | Admitting: Pharmacy Technician

## 2020-12-09 DIAGNOSIS — I5031 Acute diastolic (congestive) heart failure: Secondary | ICD-10-CM | POA: Diagnosis not present

## 2020-12-09 DIAGNOSIS — Z8542 Personal history of malignant neoplasm of other parts of uterus: Secondary | ICD-10-CM | POA: Insufficient documentation

## 2020-12-09 DIAGNOSIS — I110 Hypertensive heart disease with heart failure: Secondary | ICD-10-CM | POA: Diagnosis not present

## 2020-12-09 DIAGNOSIS — Z20822 Contact with and (suspected) exposure to covid-19: Secondary | ICD-10-CM | POA: Diagnosis not present

## 2020-12-09 DIAGNOSIS — Z79899 Other long term (current) drug therapy: Secondary | ICD-10-CM | POA: Diagnosis not present

## 2020-12-09 DIAGNOSIS — Z7984 Long term (current) use of oral hypoglycemic drugs: Secondary | ICD-10-CM | POA: Insufficient documentation

## 2020-12-09 DIAGNOSIS — R0602 Shortness of breath: Secondary | ICD-10-CM | POA: Diagnosis present

## 2020-12-09 DIAGNOSIS — J449 Chronic obstructive pulmonary disease, unspecified: Secondary | ICD-10-CM | POA: Diagnosis not present

## 2020-12-09 DIAGNOSIS — Z85118 Personal history of other malignant neoplasm of bronchus and lung: Secondary | ICD-10-CM | POA: Diagnosis not present

## 2020-12-09 DIAGNOSIS — J439 Emphysema, unspecified: Secondary | ICD-10-CM

## 2020-12-09 DIAGNOSIS — I5033 Acute on chronic diastolic (congestive) heart failure: Secondary | ICD-10-CM

## 2020-12-09 DIAGNOSIS — I2609 Other pulmonary embolism with acute cor pulmonale: Secondary | ICD-10-CM | POA: Diagnosis not present

## 2020-12-09 DIAGNOSIS — E119 Type 2 diabetes mellitus without complications: Secondary | ICD-10-CM

## 2020-12-09 DIAGNOSIS — J9611 Chronic respiratory failure with hypoxia: Secondary | ICD-10-CM | POA: Diagnosis not present

## 2020-12-09 DIAGNOSIS — Z7901 Long term (current) use of anticoagulants: Secondary | ICD-10-CM | POA: Diagnosis not present

## 2020-12-09 DIAGNOSIS — J4489 Other specified chronic obstructive pulmonary disease: Secondary | ICD-10-CM | POA: Diagnosis present

## 2020-12-09 DIAGNOSIS — N1831 Chronic kidney disease, stage 3a: Secondary | ICD-10-CM

## 2020-12-09 DIAGNOSIS — I509 Heart failure, unspecified: Secondary | ICD-10-CM

## 2020-12-09 DIAGNOSIS — Z87891 Personal history of nicotine dependence: Secondary | ICD-10-CM | POA: Insufficient documentation

## 2020-12-09 DIAGNOSIS — I272 Pulmonary hypertension, unspecified: Secondary | ICD-10-CM | POA: Diagnosis present

## 2020-12-09 DIAGNOSIS — I2699 Other pulmonary embolism without acute cor pulmonale: Secondary | ICD-10-CM | POA: Diagnosis present

## 2020-12-09 LAB — CBC WITH DIFFERENTIAL/PLATELET
Abs Immature Granulocytes: 0.02 10*3/uL (ref 0.00–0.07)
Basophils Absolute: 0.1 10*3/uL (ref 0.0–0.1)
Basophils Relative: 1 %
Eosinophils Absolute: 0.2 10*3/uL (ref 0.0–0.5)
Eosinophils Relative: 3 %
HCT: 35.8 % — ABNORMAL LOW (ref 36.0–46.0)
Hemoglobin: 10.8 g/dL — ABNORMAL LOW (ref 12.0–15.0)
Immature Granulocytes: 0 %
Lymphocytes Relative: 28 %
Lymphs Abs: 1.9 10*3/uL (ref 0.7–4.0)
MCH: 27.1 pg (ref 26.0–34.0)
MCHC: 30.2 g/dL (ref 30.0–36.0)
MCV: 89.9 fL (ref 80.0–100.0)
Monocytes Absolute: 0.5 10*3/uL (ref 0.1–1.0)
Monocytes Relative: 8 %
Neutro Abs: 4 10*3/uL (ref 1.7–7.7)
Neutrophils Relative %: 60 %
Platelets: 325 10*3/uL (ref 150–400)
RBC: 3.98 MIL/uL (ref 3.87–5.11)
RDW: 16.4 % — ABNORMAL HIGH (ref 11.5–15.5)
WBC: 6.7 10*3/uL (ref 4.0–10.5)
nRBC: 0 % (ref 0.0–0.2)

## 2020-12-09 LAB — BASIC METABOLIC PANEL
Anion gap: 7 (ref 5–15)
BUN: 14 mg/dL (ref 8–23)
CO2: 24 mmol/L (ref 22–32)
Calcium: 9.4 mg/dL (ref 8.9–10.3)
Chloride: 106 mmol/L (ref 98–111)
Creatinine, Ser: 0.87 mg/dL (ref 0.44–1.00)
GFR, Estimated: >60 mL/min (ref >60)
Glucose, Bld: 185 mg/dL — ABNORMAL HIGH (ref 70–99)
Potassium: 4.4 mmol/L (ref 3.5–5.1)
Sodium: 137 mmol/L (ref 135–145)

## 2020-12-09 LAB — RESP PANEL BY RT-PCR (FLU A&B, COVID) ARPGX2
Influenza A by PCR: NEGATIVE
Influenza B by PCR: NEGATIVE
SARS Coronavirus 2 by RT PCR: NEGATIVE

## 2020-12-09 LAB — HEMOGLOBIN A1C
Hgb A1c MFr Bld: 7.2 % — ABNORMAL HIGH (ref 4.8–5.6)
Mean Plasma Glucose: 159.94 mg/dL

## 2020-12-09 LAB — TROPONIN I (HIGH SENSITIVITY)
Troponin I (High Sensitivity): 66 ng/L — ABNORMAL HIGH (ref <18)
Troponin I (High Sensitivity): 58 ng/L — ABNORMAL HIGH (ref <18)

## 2020-12-09 LAB — GLUCOSE, CAPILLARY: Glucose-Capillary: 140 mg/dL — ABNORMAL HIGH (ref 70–99)

## 2020-12-09 LAB — BRAIN NATRIURETIC PEPTIDE: B Natriuretic Peptide: 224.9 pg/mL — ABNORMAL HIGH (ref 0.0–100.0)

## 2020-12-09 MED ORDER — ALBUTEROL SULFATE (2.5 MG/3ML) 0.083% IN NEBU: 2.5000 mg | INHALATION_SOLUTION | Freq: Four times a day (QID) | RESPIRATORY_TRACT | Status: DC | PRN

## 2020-12-09 MED ORDER — SODIUM CHLORIDE 0.9% FLUSH
3.0000 mL | Freq: Two times a day (BID) | INTRAVENOUS | Status: DC
  Administered 2020-12-10: 3 mL via INTRAVENOUS

## 2020-12-09 MED ORDER — UMECLIDINIUM-VILANTEROL 62.5-25 MCG/INH IN AEPB
1.0000 | INHALATION_SPRAY | Freq: Every day | RESPIRATORY_TRACT | Status: DC
  Administered 2020-12-10: 1 via RESPIRATORY_TRACT
  Filled 2020-12-09: qty 14

## 2020-12-09 MED ORDER — SODIUM CHLORIDE 0.9 % IV SOLN: 250.0000 mL | INTRAVENOUS | Status: DC | PRN

## 2020-12-09 MED ORDER — FUROSEMIDE 10 MG/ML IJ SOLN
40.0000 mg | Freq: Once | INTRAMUSCULAR | Status: AC
  Administered 2020-12-09: 40 mg via INTRAVENOUS
  Filled 2020-12-09: qty 4

## 2020-12-09 MED ORDER — INSULIN ASPART 100 UNIT/ML ~~LOC~~ SOLN
0.0000 [IU] | Freq: Three times a day (TID) | SUBCUTANEOUS | Status: DC
  Administered 2020-12-10 (×2): 1 [IU] via SUBCUTANEOUS

## 2020-12-09 MED ORDER — ACETAMINOPHEN 325 MG PO TABS
650.0000 mg | ORAL_TABLET | ORAL | Status: DC | PRN
  Administered 2020-12-09 – 2020-12-10 (×2): 650 mg via ORAL
  Filled 2020-12-09 (×2): qty 2

## 2020-12-09 MED ORDER — PANTOPRAZOLE SODIUM 40 MG PO TBEC
40.0000 mg | DELAYED_RELEASE_TABLET | Freq: Every day | ORAL | Status: DC
  Administered 2020-12-09 – 2020-12-10 (×2): 40 mg via ORAL
  Filled 2020-12-09 (×2): qty 1

## 2020-12-09 MED ORDER — SODIUM CHLORIDE 0.9% FLUSH
3.0000 mL | INTRAVENOUS | Status: DC | PRN
  Administered 2020-12-09: 3 mL via INTRAVENOUS

## 2020-12-09 MED ORDER — FUROSEMIDE 10 MG/ML IJ SOLN
40.0000 mg | Freq: Two times a day (BID) | INTRAMUSCULAR | Status: DC
  Administered 2020-12-10: 40 mg via INTRAVENOUS
  Filled 2020-12-09: qty 4

## 2020-12-09 MED ORDER — ONDANSETRON HCL 4 MG/2ML IJ SOLN: 4.0000 mg | Freq: Four times a day (QID) | INTRAMUSCULAR | Status: DC | PRN

## 2020-12-09 MED ORDER — PRAVASTATIN SODIUM 10 MG PO TABS
10.0000 mg | ORAL_TABLET | Freq: Every day | ORAL | Status: DC
  Administered 2020-12-09 – 2020-12-10 (×2): 10 mg via ORAL
  Filled 2020-12-09 (×2): qty 1

## 2020-12-09 MED ORDER — APIXABAN 5 MG PO TABS
5.0000 mg | ORAL_TABLET | Freq: Two times a day (BID) | ORAL | Status: DC
  Administered 2020-12-09 – 2020-12-10 (×2): 5 mg via ORAL
  Filled 2020-12-09 (×2): qty 1

## 2020-12-09 MED ORDER — LOSARTAN POTASSIUM 25 MG PO TABS
25.0000 mg | ORAL_TABLET | Freq: Every day | ORAL | Status: DC
  Administered 2020-12-09 – 2020-12-10 (×2): 25 mg via ORAL
  Filled 2020-12-09 (×2): qty 1

## 2020-12-09 MED ORDER — INSULIN ASPART 100 UNIT/ML ~~LOC~~ SOLN: 0.0000 [IU] | Freq: Every day | SUBCUTANEOUS | Status: DC

## 2020-12-09 NOTE — Progress Notes (Signed)
Patient arrived to unit and ambulated to bed, daughter and another visitor were present at bedside. VSS, no complaints from patient at this time. Will continue to assess and monitor.

## 2020-12-09 NOTE — ED Provider Notes (Signed)
[facility] EMERGENCY DEPARTMENT Provider Note   CSN: [id number] Arrival date & time: 12/09/20  1326     History Chief Complaint  Patient presents with  . Shortness of Breath    Brianna Adams is a 80 y.o. female.  80 year old female presents with increased shortness of breath x1.5 weeks.  Does have a history of COPD as well as CHF.  Is chronically on home oxygen and has had to increase that from 3 to 4 L.  Has had nonproductive cough without fever or chills.  Increased dyspnea on exertion without orthopnea.  Denies any lower extremity edema.  Has used her home inhalers without relief.  Denies any anginal type chest pain.  Patient chronically on Eliquis due to history of PE        Past Medical History:  Diagnosis Date  . CHF (congestive heart failure) ([place])   . COPD (chronic obstructive pulmonary disease) ([place])   . Diabetes ([place])    type 2  . Diverticulitis   . Dyspnea    On Oxygen 3L via [place]   . GERD (gastroesophageal reflux disease)   . High cholesterol   . Lung cancer ([place])   . PE (pulmonary thromboembolism) ([place]) 02/29/2020  . Pulmonary hypertension ([place])   . Uterine cancer [facility])     Patient Active Problem List   Diagnosis Date Noted  . Lobar pneumonia ([place])   . Acute diastolic CHF (congestive heart failure) ([place])   . Acute on chronic respiratory failure with hypoxia ([place])   . Lymphadenopathy   . Pulmonary embolism ([place]) 02/29/2020  . Chronic respiratory failure with hypoxia ([place]) 02/18/2020  . Pulmonary nodule 02/18/2020  . GERD (gastroesophageal reflux disease) 02/18/2020  . Pulmonary hypertension, unspecified ([place]) 10/04/2019  . Chronic obstructive pulmonary disease ([place]) 03/14/2019  . Type 2 diabetes mellitus without complication, without long-term current use of insulin ([place]) 03/14/2019    Past Surgical History:  Procedure Laterality Date  . ABDOMINAL HYSTERECTOMY    . cataract surgery  2012 and 2017  . COLONOSCOPY    . FIBEROPTIC  BRONCHOSCOPY    . GANGLION CYST EXCISION Left 09/11/2020   Procedure: EXCISION VOLAR RADIAL GANGLION OF LEFT WRIST;  Surgeon: Daryll Brod, MD;  Location: [facility];  Service: Orthopedics;  Laterality: Left;  AXILLARY BLOCK  . resection of lung cancer  2018  . right lower lobe non-anatomocal lung resection wedge    . right thorascoscopy       OB History   No obstetric history on file.     Family History  Problem Relation Age of Onset  . Hypertension Mother   . Heart attack Father   . Cancer Maternal Aunt     Social History   Tobacco Use  . Smoking status: Former Smoker    Packs/day: 1.00    Years: 58.00    Pack years: 58.00    Types: Cigarettes    Quit date: 10/22/2015    Years since quitting: 5.1  . Smokeless tobacco: Never Used  Vaping Use  . Vaping Use: Never used  Substance Use Topics  . Alcohol use: Never  . Drug use: Never    Home Medications Prior to Admission medications   Medication Sig Start Date End Date Taking? Authorizing Provider  acetaminophen (TYLENOL) 500 MG tablet Take 1,000 mg by mouth every 6 (six) hours as needed for mild pain or fever.  01/30/17   [provider]  albuterol (VENTOLIN HFA) 108 (90 Base) MCG/ACT inhaler Inhale 2  puffs into the lungs every 6 (six) hours as needed for wheezing or shortness of breath. 03/03/20   Loletha Grayer, MD  calcium carbonate (OSCAL) 1500 (600 Ca) MG TABS tablet Take 1,200 mg by mouth daily.    [provider]  Cinnamon 500 MG TABS Take 1,000 mg by mouth daily.     [provider]  ELIQUIS 5 MG TABS tablet TAKE 1 TABLET BY MOUTH TWICE A DAY 10/03/20   [name], [name]  furosemide (LASIX) 20 MG tablet TAKE 1 TABLET BY MOUTH EVERY DAY Patient taking differently: Take 20 mg by mouth daily. 04/14/20   [name], [name]  metFORMIN (GLUCOPHAGE) 1000 MG tablet TAKE 1 TABLET (1,000 MG TOTAL) BY MOUTH 2 (TWO) TIMES DAILY WITH A MEAL. 11/18/20 11/18/21  [name], [name]  Multiple Vitamins-Minerals  (CENTRUM SILVER PO) Take 1 tablet by mouth daily.     [provider]  Multiple Vitamins-Minerals (OCUVITE EYE HEALTH FORMULA PO) Take 1 capsule by mouth daily.    [provider]  omeprazole (PRILOSEC) 40 MG capsule TAKE 1 CAPSULE BY MOUTH EVERY DAY 10/02/20   [name], [name]  pravastatin (PRAVACHOL) 10 MG tablet TAKE 1 TABLET BY MOUTH EVERY DAY 10/21/20   [name], [name]  traMADol (ULTRAM) 50 MG tablet Take 1 tablet (50 mg total) by mouth every 6 (six) hours as needed. Patient not taking: Reported on 10/09/2020 09/11/20 09/11/21  Daryll Brod, MD  umeclidinium-vilanterol [facility] ELLIPTA) 62.5-25 MCG/INH AEPB Inhale 1 puff into the lungs daily. 08/13/20   Tyler Pita, MD  vitamin C (ASCORBIC ACID) 500 MG tablet Take 500 mg by mouth daily.     [provider]  vitamin E 1000 UNIT capsule Take 1,000 Units by mouth 3 (three) times a week. Takes three times a week    [provider]    Allergies    Penicillin g, Ace inhibitors, Atorvastatin, Cortisone, Hydrocortisone, Lisinopril, Meloxicam, Penicillins, and Pravastatin  Review of Systems   Review of Systems  All other systems reviewed and are negative.   Physical Exam Updated Vital Signs BP 122/66   Pulse 85   Temp 98.2 F (36.8 C) (Oral)   Resp (!) 22   SpO2 95%   Physical Exam Vitals and nursing note reviewed.  Constitutional:      General: She is not in acute distress.    Appearance: Normal appearance. She is well-developed. She is not toxic-appearing.  HENT:     Head: Normocephalic and atraumatic.  Eyes:     General: Lids are normal.     Conjunctiva/sclera: Conjunctivae normal.     Pupils: Pupils are equal, round, and reactive to light.  Neck:     Thyroid: No thyroid mass.     Trachea: No tracheal deviation.  Cardiovascular:     Rate and Rhythm: Normal rate and regular rhythm.     Heart sounds: Normal heart sounds. No murmur heard. No gallop.   Pulmonary:     Effort: Tachypnea  present. No respiratory distress.     Breath sounds: No stridor. Decreased breath sounds present. No wheezing, rhonchi or rales.  Abdominal:     General: Bowel sounds are normal. There is no distension.     Palpations: Abdomen is soft.     Tenderness: There is no abdominal tenderness. There is no rebound.  Musculoskeletal:        General: No tenderness. Normal range of motion.     Cervical back: Normal range of motion and neck  supple.  Skin:    General: Skin is warm and dry.     Findings: No abrasion or rash.  Neurological:     Mental Status: She is alert and oriented to person, place, and time.     GCS: GCS eye subscore is 4. GCS verbal subscore is 5. GCS motor subscore is 6.     Cranial Nerves: No cranial nerve deficit.     Sensory: No sensory deficit.  Psychiatric:        Speech: Speech normal.        Behavior: Behavior normal.     ED Results / Procedures / Treatments   Labs (all labs ordered are listed, but only abnormal results are displayed) Labs Reviewed  CBC WITH DIFFERENTIAL/PLATELET - Abnormal; Notable for the following components:      Result Value   Hemoglobin 10.8 (*)    HCT 35.8 (*)    RDW 16.4 (*)    All other components within normal limits  BASIC METABOLIC PANEL - Abnormal; Notable for the following components:   Glucose, Bld 185 (*)    All other components within normal limits  TROPONIN I (HIGH SENSITIVITY) - Abnormal; Notable for the following components:   Troponin I (High Sensitivity) 66 (*)    All other components within normal limits  BRAIN NATRIURETIC PEPTIDE  TROPONIN I (HIGH SENSITIVITY)    EKG EKG Interpretation  Date/Time:  Tuesday December 09 2020 13:31:23 EDT Ventricular Rate:  91 PR Interval:  120 QRS Duration: 84 QT Interval:  360 QTC Calculation: 442 R Axis:   63 Text Interpretation: Normal sinus rhythm Nonspecific ST and T wave abnormality Abnormal ECG unchanged Confirmed by Noemi Chapel [phone]) on 12/09/2020 2:28:59  PM   Radiology DG Chest 1 View  Result Date: 12/09/2020 CLINICAL DATA:  Shortness of breath for 2 weeks. EXAM: CHEST  1 VIEW COMPARISON:  06/25/2020 FINDINGS: Mild cardiac enlargement. Advanced chronic interstitial changes of emphysema. Superimposed diffuse increase interstitial opacities are noted throughout both lungs which may reflect interstitial edema or atypical viral infection. Nodular opacities within the left upper lobe and lingula are again noted and appear similar to the previous exam. Postop change within the right lower lobe noted with several suture chains. The visualized osseous structures appear intact . IMPRESSION: 1. Diffuse increase interstitial markings are noted bilaterally compatible with either pulmonary edema or atypical infection. Correlate for any clinical signs or symptoms of CHF. 2. Unchanged nodular opacities within the left upper lobe and lingula. Electronically Signed   By: Kerby Moors M.D.   On: 12/09/2020 14:30    Procedures Procedures   Medications Ordered in ED Medications - No data to display  ED Course  I have reviewed the triage vital signs and the nursing notes.  Pertinent labs & imaging results that were available during my care of the patient were reviewed by me and considered in my medical decision making (see chart for details).    MDM Rules/Calculators/A&P                          Patient's labs and x-rays noted here.  She is Covid negative.  X-ray does show findings consistent with likely pulmonary edema.  Given Lasix IV here.  Due to increasing oxygen requirement will require hospitalization. Final Clinical Impression(s) / ED Diagnoses Final diagnoses:  SOB (shortness of breath)    Rx / DC Orders ED Discharge Orders    None  Lacretia Leigh, MD 12/09/20 [phone]

## 2020-12-09 NOTE — ED Triage Notes (Signed)
Pt here POV with reports of shob X2 weeks. Hx COPD on 3L [place] at baseline. Pt reports having to increase her oxygen to 4L [place] without much improvement. Pt reports becoming increasingly shob with exertion. Denies sick contacts.

## 2020-12-09 NOTE — ED Triage Notes (Signed)
Emergency Medicine Provider Triage Evaluation Note  Brianna Adams , a 80 y.o. female  was evaluated in triage.  Pt complains of shortness of breath x 2 weeks. History of COPD, lung cancer, and pulmonary HTN. Denies fever, chills, cough, and chest pain. On chronic oxygen at home and recently increased it from 3L to 4L 2 weeks ago. Shortness of breath worse with exertion. No lower extremity edema. Denies history of blood clots (however per chest review history of PE on chronic Eliquis), recent surgeries, recent long immobilizations, and hormonal treatment. Does have a history of lung cancer.  Review of Systems  Positive: Shortness of breath Negative: cough  Physical Exam  There were no vitals taken for this visit. Gen:   Awake, no distress   HEENT:  Atraumatic  Resp:  Normal effort Cardiac:  Normal rate Abd:   Nondistended, nontender  MSK:   Moves extremities without difficulty Neuro:  Speech clear   Medical Decision Making  Medically screening exam initiated at 1:30 PM.  Appropriate orders placed.  Brianna Adams was informed that the remainder of the evaluation will be completed by another provider, this initial triage assessment does not replace that evaluation, and the importance of remaining in the ED until their evaluation is complete.  Clinical Impression  SOB with history of lung cancer, COPD, and pulmonary HTN. Labs ordered, EKG, and CXR ordered. No lower extremity edema or signs of DVT.    Brianna Adams, [place] 12/09/20 1337

## 2020-12-09 NOTE — H&P (Addendum)
History and Physical    Brianna Adams ACZ:[id number] DOB: [birth year] DOA: 12/09/2020  PCP: Brianna Brine, [name]  Patient coming from: home  I have personally briefly reviewed patient's old medical records in [place]  Chief Complaint: shortness of breath  HPI: Brianna Adams is a 80 y.o. female with medical history significant of chronic respiratory failure on 3 L of oxygen, COPD, chronic diastolic heart failure, history of pulmonary embolism on anticoagulation, diabetes, presents to the hospital with complaints of shortness of breath.  Patient reports progressively worsening shortness of breath over the past 2 weeks.  She has not noticed any lower extremity edema.  She said no chest pain.  She has had no fever.  She has had occasional cough which is nonproductive.  She denies any PND or orthopnea.  She mainly describes dyspnea on exertion, but it was also at rest.  She noted that she had to increase her basal oxygen requirement from 3 L to 4 L.  She admits that she has not been taking her Lasix on a regular basis.  Over the past week, she may have missed taking her home 3 days.  She says she does not like to take it on days that she has to go out since it makes her frequently urinate.  ED Course: She was noted to be short of breath.  Chest x-ray indicated bilateral infiltrates consistent with pulmonary edema.  BNP mildly elevated.  Other labs are unrevealing.  She received Lasix in the emergency room with good urine output.  Review of Systems:  Review of Systems  Constitutional: Negative for chills, diaphoresis and fever.  HENT: Negative for congestion and sore throat.   Respiratory: Positive for cough and shortness of breath. Negative for sputum production and wheezing.   Cardiovascular: Negative for chest pain, orthopnea, leg swelling and PND.  Gastrointestinal: Negative for abdominal pain, diarrhea, nausea and vomiting.  Genitourinary: Negative for dysuria.  Musculoskeletal: Negative  for back pain and myalgias.  Neurological: Negative for dizziness, loss of consciousness and weakness.      Past Medical History:  Diagnosis Date  . CHF (congestive heart failure) ([place])   . COPD (chronic obstructive pulmonary disease) ([place])   . Diabetes ([place])    type 2  . Diverticulitis   . Dyspnea    On Oxygen 3L via [place]   . GERD (gastroesophageal reflux disease)   . High cholesterol   . Lung cancer ([place])   . PE (pulmonary thromboembolism) ([place]) 02/29/2020  . Pulmonary hypertension ([place])   . Uterine cancer [facility])     Past Surgical History:  Procedure Laterality Date  . ABDOMINAL HYSTERECTOMY    . cataract surgery  2012 and 2017  . COLONOSCOPY    . FIBEROPTIC BRONCHOSCOPY    . GANGLION CYST EXCISION Left 09/11/2020   Procedure: EXCISION VOLAR RADIAL GANGLION OF LEFT WRIST;  Surgeon: Daryll Brod, MD;  Location: [facility];  Service: Orthopedics;  Laterality: Left;  AXILLARY BLOCK  . resection of lung cancer  2018  . right lower lobe non-anatomocal lung resection wedge    . right thorascoscopy      Social History:  reports that she quit smoking about 5 years ago. Her smoking use included cigarettes. She has a 58.00 pack-year smoking history. She has never used smokeless tobacco. She reports that she does not drink alcohol and does not use drugs.  Allergies  Allergen Reactions  . Penicillin G Shortness Of Breath    Other reaction(s): Rash  .  Ace Inhibitors Swelling  . Atorvastatin Other (See Comments)    Other reaction(s): Myalgias (Muscle Pain)   . Cortisone Other (See Comments)    Other reaction(s): Patient not sure  . Hydrocortisone Other (See Comments)    unknown  . Lisinopril Other (See Comments)    Other reaction(s): Angioedema  . Meloxicam Other (See Comments)    Bloody stools  . Penicillins Hives    60 years ago  . Pravastatin Other (See Comments)     Chest Pain High dosage     Family History  Problem Relation Age of Onset  . Hypertension Mother   . Heart  attack Father   . Cancer Maternal Aunt     Prior to Admission medications   Medication Sig Start Date End Date Taking? Authorizing Provider  acetaminophen (TYLENOL) 500 MG tablet Take 1,000 mg by mouth every 6 (six) hours as needed for mild pain or fever.  01/30/17   [provider]  albuterol (VENTOLIN HFA) 108 (90 Base) MCG/ACT inhaler Inhale 2 puffs into the lungs every 6 (six) hours as needed for wheezing or shortness of breath. 03/03/20   Loletha Grayer, MD  calcium carbonate (OSCAL) 1500 (600 Ca) MG TABS tablet Take 1,200 mg by mouth daily.    [provider]  Cinnamon 500 MG TABS Take 1,000 mg by mouth daily.     [provider]  ELIQUIS 5 MG TABS tablet TAKE 1 TABLET BY MOUTH TWICE A DAY 10/03/20   Brianna Brine, [name]  furosemide (LASIX) 20 MG tablet TAKE 1 TABLET BY MOUTH EVERY DAY Patient taking differently: Take 20 mg by mouth daily. 04/14/20   Brianna Brine, [name]  metFORMIN (GLUCOPHAGE) 1000 MG tablet TAKE 1 TABLET (1,000 MG TOTAL) BY MOUTH 2 (TWO) TIMES DAILY WITH A MEAL. 11/18/20 11/18/21  Brianna Brine, [name]  Multiple Vitamins-Minerals (CENTRUM SILVER PO) Take 1 tablet by mouth daily.     [provider]  Multiple Vitamins-Minerals (OCUVITE EYE HEALTH FORMULA PO) Take 1 capsule by mouth daily.    [provider]  omeprazole (PRILOSEC) 40 MG capsule TAKE 1 CAPSULE BY MOUTH EVERY DAY 10/02/20   Brianna Brine, [name]  pravastatin (PRAVACHOL) 10 MG tablet TAKE 1 TABLET BY MOUTH EVERY DAY 10/21/20   Brianna Brine, [name]  traMADol (ULTRAM) 50 MG tablet Take 1 tablet (50 mg total) by mouth every 6 (six) hours as needed. Patient not taking: Reported on 10/09/2020 09/11/20 09/11/21  Daryll Brod, MD  umeclidinium-vilanterol [facility] ELLIPTA) 62.5-25 MCG/INH AEPB Inhale 1 puff into the lungs daily. 08/13/20   Tyler Pita, MD  vitamin C (ASCORBIC ACID) 500 MG tablet Take 500 mg by mouth daily.     [provider]  vitamin E 1000 UNIT capsule Take 1,000  Units by mouth 3 (three) times a week. Takes three times a week    [provider]    Physical Exam: Vitals:   12/09/20 1715 12/09/20 1730 12/09/20 1736 12/09/20 1745  BP: 127/76 124/71  (!) 141/74  Pulse: 74 77  75  Resp: 19 20  (!) 26  Temp:      TempSrc:      SpO2: 97% 96%  93%  Weight:   65.8 kg   Height:   _0  (1.6 m)     Constitutional: NAD, calm, comfortable Eyes: PERRL, lids and conjunctivae normal ENMT: Mucous membranes are moist. Posterior pharynx clear of any exudate or lesions.Normal dentition.  Neck: normal, supple, no masses, no thyromegaly Respiratory: clear  to auscultation bilaterally, no wheezing, no crackles. Normal respiratory effort. No accessory muscle use.  Cardiovascular: Regular rate and rhythm, no murmurs / rubs / gallops. No extremity edema. 2+ pedal pulses. No carotid bruits.  Abdomen: no tenderness, no masses palpated. No hepatosplenomegaly. Bowel sounds positive.  Musculoskeletal: no clubbing / cyanosis. No joint deformity upper and lower extremities. Good ROM, no contractures. Normal muscle tone.  Skin: no rashes, lesions, ulcers. No induration Neurologic: CN 2-12 grossly intact. Sensation intact, DTR normal. Strength 5/5 in all 4.  Psychiatric: Normal judgment and insight. Alert and oriented x 3. Normal mood.    Labs on Admission: I have personally reviewed following labs and imaging studies  CBC: Recent Labs  Lab 12/09/20 1333  WBC 6.7  NEUTROABS 4.0  HGB 10.8*  HCT 35.8*  MCV 89.9  PLT 347   Basic Metabolic Panel: Recent Labs  Lab 12/09/20 1333  NA 137  K 4.4  CL 106  CO2 24  GLUCOSE 185*  BUN 14  CREATININE 0.87  CALCIUM 9.4   GFR: Estimated Creatinine Clearance: 46.3 mL/min (by C-G formula based on SCr of 0.87 mg/dL). Liver Function Tests: No results for input(s): AST, ALT, ALKPHOS, BILITOT, PROT, ALBUMIN in the last 168 hours. No results for input(s): LIPASE, AMYLASE in the last 168 hours. No results for  input(s): AMMONIA in the last 168 hours. Coagulation Profile: No results for input(s): INR, PROTIME in the last 168 hours. Cardiac Enzymes: No results for input(s): CKTOTAL, CKMB, CKMBINDEX, TROPONINI in the last 168 hours. BNP (last 3 results) No results for input(s): PROBNP in the last 8760 hours. HbA1C: No results for input(s): HGBA1C in the last 72 hours. CBG: No results for input(s): GLUCAP in the last 168 hours. Lipid Profile: No results for input(s): CHOL, HDL, LDLCALC, TRIG, CHOLHDL, LDLDIRECT in the last 72 hours. Thyroid Function Tests: No results for input(s): TSH, T4TOTAL, FREET4, T3FREE, THYROIDAB in the last 72 hours. Anemia Panel: No results for input(s): VITAMINB12, FOLATE, FERRITIN, TIBC, IRON, RETICCTPCT in the last 72 hours. Urine analysis:    Component Value Date/Time   BILIRUBINUR negative 10/09/2020 1205   PROTEINUR Negative 10/09/2020 1205   UROBILINOGEN 0.2 10/09/2020 1205   NITRITE negative 10/09/2020 1205   LEUKOCYTESUR Negative 10/09/2020 1205    Radiological Exams on Admission: DG Chest 1 View  Result Date: 12/09/2020 CLINICAL DATA:  Shortness of breath for 2 weeks. EXAM: CHEST  1 VIEW COMPARISON:  06/25/2020 FINDINGS: Mild cardiac enlargement. Advanced chronic interstitial changes of emphysema. Superimposed diffuse increase interstitial opacities are noted throughout both lungs which may reflect interstitial edema or atypical viral infection. Nodular opacities within the left upper lobe and lingula are again noted and appear similar to the previous exam. Postop change within the right lower lobe noted with several suture chains. The visualized osseous structures appear intact . IMPRESSION: 1. Diffuse increase interstitial markings are noted bilaterally compatible with either pulmonary edema or atypical infection. Correlate for any clinical signs or symptoms of CHF. 2. Unchanged nodular opacities within the left upper lobe and lingula. Electronically Signed    By: Kerby Moors M.D.   On: 12/09/2020 14:30    EKG: Independently reviewed. Sinus rhythm without acute ischemic changes  Assessment/Plan Active Problems:   Chronic obstructive pulmonary disease (HCC)   Type 2 diabetes mellitus without complication, without long-term current use of insulin ([place])   Pulmonary hypertension, unspecified (HCC)   Chronic respiratory failure with hypoxia (HCC)   Pulmonary embolism (HCC)   Acute diastolic  CHF (congestive heart failure) (HCC)   CHF exacerbation (HCC)     Acute on chronic diastolic congestive heart failure and pulmonary hypertension -Last echo was in 04/2020 showed EF 50 to 55% with grade 1 diastolic dysfunction -We will update echocardiogram -She has had good urine output in the emergency room after receiving 2 doses of Lasix 40 mg IV -We will continue on twice daily Lasix -Start on low-dose losartan, this may also be beneficial for diabetes -Hold off on beta-blockers for now -Monitor intake and output -She does not appear to be excessively volume overloaded may be able to discharge in the next 24 hours if she is able to ambulate without shortness of breath  COPD -No evidence of wheezing at this time -Continue bronchodilators as needed  Chronic respiratory failure with hypoxia -Chronically on 3 L of oxygen -She is currently at her baseline requirement  Diabetes, non-insulin-dependent -She is on Metformin at home -We will start on sliding scale insulin  History of pulmonary embolism -She is chronically on Eliquis -Per pulmonology, due to her multiple comorbidities, she will need to be on lifelong anticoagulation  DVT prophylaxis: eliquis  Code Status: full code  Family Communication: tried calling daughter, no answer on phone and unable to leave VM  Disposition Plan: discharge home once volume status has improved  Consults called:   Admission status: observation, tele   Kathie Dike MD [facility]   If 7PM-7AM,  please contact night-coverage [email or web address]   12/09/2020, 6:31 PM

## 2020-12-09 NOTE — ED Notes (Signed)
Attempted to call report.

## 2020-12-10 ENCOUNTER — Encounter (HOSPITAL_COMMUNITY): Payer: Self-pay | Admitting: Internal Medicine

## 2020-12-10 ENCOUNTER — Other Ambulatory Visit (HOSPITAL_COMMUNITY): Payer: Self-pay

## 2020-12-10 ENCOUNTER — Observation Stay (HOSPITAL_BASED_OUTPATIENT_CLINIC_OR_DEPARTMENT_OTHER): Payer: Medicare Other

## 2020-12-10 DIAGNOSIS — I5031 Acute diastolic (congestive) heart failure: Secondary | ICD-10-CM

## 2020-12-10 DIAGNOSIS — I110 Hypertensive heart disease with heart failure: Secondary | ICD-10-CM | POA: Diagnosis not present

## 2020-12-10 LAB — BASIC METABOLIC PANEL
Anion gap: 8 (ref 5–15)
BUN: 19 mg/dL (ref 8–23)
CO2: 27 mmol/L (ref 22–32)
Calcium: 9.5 mg/dL (ref 8.9–10.3)
Chloride: 104 mmol/L (ref 98–111)
Creatinine, Ser: 0.98 mg/dL (ref 0.44–1.00)
GFR, Estimated: 58 mL/min — ABNORMAL LOW (ref >60)
Glucose, Bld: 136 mg/dL — ABNORMAL HIGH (ref 70–99)
Potassium: 3.9 mmol/L (ref 3.5–5.1)
Sodium: 139 mmol/L (ref 135–145)

## 2020-12-10 LAB — GLUCOSE, CAPILLARY
Glucose-Capillary: 141 mg/dL — ABNORMAL HIGH (ref 70–99)
Glucose-Capillary: 134 mg/dL — ABNORMAL HIGH (ref 70–99)

## 2020-12-10 LAB — ECHOCARDIOGRAM COMPLETE
AR max vel: 1.51 cm2
AV Area VTI: 1.58 cm2
AV Area mean vel: 1.52 cm2
AV Mean grad: 3 mmHg
AV Peak grad: 6 mmHg
Ao pk vel: 1.22 m/s
Area-P 1/2: 2.24 cm2
Calc EF: 56 %
Height: 63 in
S' Lateral: 3 cm
Single Plane A2C EF: 60.2 %
Single Plane A4C EF: 51.5 %
Weight: 2225.76 oz

## 2020-12-10 NOTE — Plan of Care (Signed)
  Problem: Education: Goal: Knowledge of General Education information will improve Description: Including pain rating scale, medication(s)/side effects and non-pharmacologic comfort measures Outcome: Progressing   Problem: Health Behavior/Discharge Planning: Goal: Ability to manage health-related needs will improve Outcome: Progressing   Problem: Clinical Measurements: Goal: Ability to maintain clinical measurements within normal limits will improve Outcome: Progressing   Problem: Clinical Measurements: Goal: Cardiovascular complication will be avoided Outcome: Progressing

## 2020-12-10 NOTE — Discharge Summary (Addendum)
Discharge Summary  Brianna Adams VOJ:[id number] DOB: 04/30/1941  PCP: [name], [name]  Admit date: 12/09/2020 Discharge date: 12/10/2020  Time spent: 35 minutes.  Recommendations for Outpatient Follow-up:  1. Follow-up with your cardiologist in 1 to 2 weeks 2. Follow-up with your pulmonologist in 1 to 2 weeks 3. Follow-up with your primary care provider in 1 to 2 weeks. 4. Take your medications as prescribed.  Discharge Diagnoses:  Active Hospital Problems   Diagnosis Date Noted  . CHF exacerbation ([place]) 12/09/2020  . Acute diastolic CHF (congestive heart failure) ([place])   . Pulmonary embolism ([place]) 02/29/2020  . Chronic respiratory failure with hypoxia ([place]) 02/18/2020  . Pulmonary hypertension, unspecified ([place]) 10/04/2019  . Chronic obstructive pulmonary disease ([place]) 03/14/2019  . Type 2 diabetes mellitus without complication, without long-term current use of insulin ([place]) 03/14/2019    Resolved Hospital Problems  No resolved problems to display.    Discharge Condition: Stable.  Diet recommendation: Heart healthy carb modified diet.  Vitals:   12/10/20 0714 12/10/20 1128  BP: 113/68 103/63  Pulse: 73 73  Resp: 20 20  Temp: 98.2 F (36.8 C) 98 F (36.7 C)  SpO2: 96% 96%    History of present illness:  Brianna Adams is a 80 y.o. female with medical history significant of chronic respiratory failure on 3 L of oxygen nasal cannula at baseline, COPD, chronic diastolic CHF, history of pulmonary embolism on Eliquis, type 2 diabetes, presents to the hospital with complaints of progressively worsening dyspnea over the past 2 weeks.  Not associated with bilateral lower extremity edema or chest pain.  Not febrile.  She has had occasional nonproductive cough.  She denies any PND or orthopnea.  She mainly describes dyspnea on exertion.  She noted that she had to increase her oxygen requirement from 3 L to 4 L.  She admits that she has not been taking her Lasix on a regular basis.   Over the past week, she may have missed taking her home Lasix for 3 days.  She says she does not like to take it on days that she has to go out since it makes her frequently urinate.  ED Course:  Chest x-ray consistent with pulmonary edema.  BNP mildly elevated 224.  She received Lasix in the emergency room with good urine output.  Net I&O -1.6 L  12/10/20:  patient was seen and examined at her bedside.  There were no acute events overnight.  She was standing at the sink she denies any dyspnea, palpitations or chest pain.  She is eager to go home.  Hospital Course:  Active Problems:   Chronic obstructive pulmonary disease (HCC)   Type 2 diabetes mellitus without complication, without long-term current use of insulin ([place])   Pulmonary hypertension, unspecified (HCC)   Chronic respiratory failure with hypoxia (HCC)   Pulmonary embolism (HCC)   Acute diastolic CHF (congestive heart failure) (HCC)   CHF exacerbation (HCC)  Acute on chronic diastolic CHF likely secondary to medication noncompliance. Pulmonary hypertension Admits that she has not been taking her home Lasix regularly. On presentation chest x-ray consistent with pulmonary edema.  Mildly elevated BNP 224. 2D echo done on 12/10/2020 revealed LVEF 55 to 60% no regional wall motion abnormalities, grade 1 diastolic dysfunction.  RVSP 54. Updated her daughter via phone, advised to follow-up with her cardiologist within a week and to take medications as prescribed.  Her daughter was very receptive. Continue home Lasix 20 mg daily Net I&O -1.6 L Follow-up  with your cardiologist within a week.  Resolved acute on chronic hypoxic respiratory failure secondary to pulmonary edema At baseline she is on 3 L nasal cannula continuously Was initially requiring 4 L, improved with IV diuresing She is currently back to her baseline oxygen requirement 3 L with O2 saturation of 96%.  History of pulmonary embolism on Eliquis No acute issues. Continue  home Eliquis 5 mg twice daily.  Type 2 diabetes Hemoglobin A1c 7.2 on 12/09/2020. Resume home Metformin Follow with your primary care provider  Hyperlipidemia Continue home pravastatin  COPD with chronic hypoxemia No acute issues Continue home bronchodilators Continue home oxygen requirement.  Transient hypertension She received losartan 25 mg daily on 12/09/2020 and 12/10/2020 BP is currently soft, will hold off losartan to avoid hypotension She also had mild increase in her creatinine from a baseline of 0.87 to 0.98, from GFR greater than 60 to a GFR of 58, continue to hold off losartan for now. Latest BP 103/63 Follow-up with your PCP.    Procedures:  2D echo done on 12/10/2020.  Consultations:  None.  Discharge Exam: BP 103/63 (BP Location: Right Arm)   Pulse 73   Temp 98 F (36.7 C) (Oral)   Resp 20   Ht _0  (1.6 m)   Wt 63.1 kg   SpO2 96%   BMI 24.64 kg/m  . General: 80 y.o. year-old female well developed well nourished in no acute distress.  Alert and interactive. . Cardiovascular: Regular rate and rhythm with no rubs or gallops.  No thyromegaly or JVD noted.   Marland Kitchen Respiratory: Clear to auscultation with no wheezes or rales. Good inspiratory effort. . Abdomen: Soft nontender nondistended with normal bowel sounds x4 quadrants. . Musculoskeletal: No lower extremity edema.  . Skin: No ulcerative lesions noted or rashes . Psychiatry: Mood is appropriate for condition and setting  Discharge Instructions You were cared for by a hospitalist during your hospital stay. If you have any questions about your discharge medications or the care you received while you were in the hospital after you are discharged, you can call the unit and asked to speak with the hospitalist on call if the hospitalist that took care of you is not available. Once you are discharged, your primary care physician will handle any further medical issues. Please note that NO REFILLS for any discharge  medications will be authorized once you are discharged, as it is imperative that you return to your primary care physician (or establish a relationship with a primary care physician if you do not have one) for your aftercare needs so that they can reassess your need for medications and monitor your lab values.   Allergies as of 12/10/2020      Reactions   Ace Inhibitors Swelling   Lisinopril Swelling   Penicillin G Hives, Shortness Of Breath, Rash   Atorvastatin Other (See Comments)   Myalgias (Muscle Pain)   Cortisone Other (See Comments)   Unknown reaction   Hydrocortisone Other (See Comments)   Unknown reaction   Meloxicam Other (See Comments)   Bloody stools   Pravastatin Other (See Comments)   Chest Pain if high dosage       Medication List    TAKE these medications   acetaminophen 500 MG tablet Commonly known as: TYLENOL Take 1,000 mg by mouth every 6 (six) hours as needed for mild pain or fever.   albuterol 108 (90 Base) MCG/ACT inhaler Commonly known as: Ventolin HFA Inhale 2 puffs into the lungs  every 6 (six) hours as needed for wheezing or shortness of breath.   Anoro Ellipta 62.5-25 MCG/INH Aepb Generic drug: umeclidinium-vilanterol Inhale 1 puff into the lungs daily.   calcium carbonate 1500 (600 Ca) MG Tabs tablet Commonly known as: OSCAL Take 1,200 mg by mouth daily.   Cinnamon 500 MG Tabs Take 1,000 mg by mouth daily.   DRY EYES OP Apply 1 drop to eye every morning.   Eliquis 5 MG Tabs tablet Generic drug: apixaban TAKE 1 TABLET BY MOUTH TWICE A DAY What changed: how much to take   furosemide 20 MG tablet Commonly known as: LASIX TAKE 1 TABLET BY MOUTH EVERY DAY   metFORMIN 1000 MG tablet Commonly known as: GLUCOPHAGE TAKE 1 TABLET (1,000 MG TOTAL) BY MOUTH 2 (TWO) TIMES DAILY WITH A MEAL.   OCUVITE EYE HEALTH FORMULA PO Take 1 capsule by mouth daily. What changed: Another medication with the same name was removed. Continue taking this  medication, and follow the directions you see here.   omeprazole 40 MG capsule Commonly known as: PRILOSEC TAKE 1 CAPSULE BY MOUTH EVERY DAY What changed: how much to take   pravastatin 10 MG tablet Commonly known as: PRAVACHOL TAKE 1 TABLET BY MOUTH EVERY DAY   vitamin C 500 MG tablet Commonly known as: ASCORBIC ACID Take 500 mg by mouth every other day.   vitamin E 1000 UNIT capsule Take 1,000 Units by mouth every Monday, Wednesday, and Friday at 6 PM.      Allergies  Allergen Reactions  . Ace Inhibitors Swelling  . Lisinopril Swelling  . Penicillin G Hives, Shortness Of Breath and Rash  . Atorvastatin Other (See Comments)    Myalgias (Muscle Pain)   . Cortisone Other (See Comments)    Unknown reaction  . Hydrocortisone Other (See Comments)    Unknown reaction  . Meloxicam Other (See Comments)    Bloody stools  . Pravastatin Other (See Comments)    Chest Pain if high dosage     Follow-up Information    [facility]. Go on 12/19/2020.   Specialty: Cardiology Why: AT 10AM. [facility] (HV TOC) within [place] parking at [employer], off [address] all medications and pill box with you to your appt. You will see a HF doctor, pharmacist and nurse at your 1 hour appt.  Contact information: [address] [phone] mc [address] [place]       [name], [place]. Call in 1 week(s).   Specialty: General Practice Why: please call for a post hospital follow up appointment. Contact information: [address] [place] [address] [phone]                The results of significant diagnostics from this hospitalization (including imaging, microbiology, ancillary and laboratory) are listed below for reference.    Significant Diagnostic Studies: DG Chest 1 View  Result Date: 12/09/2020 CLINICAL DATA:  Shortness of breath for 2  weeks. EXAM: CHEST  1 VIEW COMPARISON:  06/25/2020 FINDINGS: Mild cardiac enlargement. Advanced chronic interstitial changes of emphysema. Superimposed diffuse increase interstitial opacities are noted throughout both lungs which may reflect interstitial edema or atypical viral infection. Nodular opacities within the left upper lobe and lingula are again noted and appear similar to the previous exam. Postop change within the right lower lobe noted with several suture chains. The visualized osseous structures appear intact . IMPRESSION: 1. Diffuse  increase interstitial markings are noted bilaterally compatible with either pulmonary edema or atypical infection. Correlate for any clinical signs or symptoms of CHF. 2. Unchanged nodular opacities within the left upper lobe and lingula. Electronically Signed   By: Kerby Moors M.D.   On: 12/09/2020 14:30   ECHOCARDIOGRAM COMPLETE  Result Date: 12/10/2020    ECHOCARDIOGRAM REPORT   Patient Name:   BRISTYN KULESZA Date of Exam: 12/10/2020 Medical Rec #:  [id number]     Height:       63.0 in Accession #:    [id number]    Weight:       139.1 lb Date of Birth:  November 10, 1940      BSA:          1.657 m Patient Age:    14 years      BP:           116/59 mmHg Patient Gender: F             HR:           79 bpm. Exam Location:  Inpatient Procedure: 2D Echo, Cardiac Doppler and Color Doppler Indications:    CHF  History:        Patient has prior history of Echocardiogram examinations, most                 recent 04/23/2020. CHF, Pulmonary HTN and COPD,                 Signs/Symptoms:Dyspnea; Risk Factors:Diabetes.  Sonographer:    Luisa Hart RDCS Referring Phys: [place]  1. Left ventricular ejection fraction, by estimation, is 55 to 60%. The left ventricle has normal function. The left ventricle has no regional wall motion abnormalities. Left ventricular diastolic parameters are consistent with Grade I diastolic dysfunction (impaired relaxation). There is the  interventricular septum is flattened in systole and diastole, consistent with right ventricular pressure and volume overload.  2. Right ventricular systolic function is normal. The right ventricular size is moderately enlarged. There is severely elevated pulmonary artery systolic pressure. The estimated right ventricular systolic pressure is 81.8 mmHg.  3. Thickened mitral leaflet tips. The mitral valve is abnormal. Mild mitral valve regurgitation.  4. The aortic valve is tricuspid. Aortic valve regurgitation is not visualized. Mild aortic valve sclerosis is present, with no evidence of aortic valve stenosis. Comparison(s): Changes from prior study are noted. 04/23/2020: LVEF 50-55%, RVSP 54.9 mmHg. FINDINGS  Left Ventricle: Left ventricular ejection fraction, by estimation, is 55 to 60%. The left ventricle has normal function. The left ventricle has no regional wall motion abnormalities. The left ventricular internal cavity size was normal in size. There is  no left ventricular hypertrophy. The interventricular septum is flattened in systole and diastole, consistent with right ventricular pressure and volume overload. Left ventricular diastolic parameters are consistent with Grade I diastolic dysfunction (impaired relaxation). Normal left ventricular filling pressure. Right Ventricle: The right ventricular size is moderately enlarged. No increase in right ventricular wall thickness. Right ventricular systolic function is normal. There is severely elevated pulmonary artery systolic pressure. The tricuspid regurgitant velocity is 4.38 m/s, and with an assumed right atrial pressure of 8 mmHg, the estimated right ventricular systolic pressure is 29.9 mmHg. Left Atrium: Left atrial size was normal in size. Right Atrium: Right atrial size was normal in size. Pericardium: There is no evidence of pericardial effusion. Mitral Valve: Thickened mitral leaflet tips. The mitral valve is abnormal. Mild mitral valve regurgitation.  Tricuspid  Valve: The tricuspid valve is grossly normal. Tricuspid valve regurgitation is trivial. Aortic Valve: The aortic valve is tricuspid. Aortic valve regurgitation is not visualized. Mild aortic valve sclerosis is present, with no evidence of aortic valve stenosis. Aortic valve mean gradient measures 3.0 mmHg. Aortic valve peak gradient measures 6.0 mmHg. Aortic valve area, by VTI measures 1.58 cm. Pulmonic Valve: The pulmonic valve was grossly normal. Pulmonic valve regurgitation is trivial. Aorta: The aortic root and ascending aorta are structurally normal, with no evidence of dilitation. IAS/Shunts: No atrial level shunt detected by color flow Doppler.  LEFT VENTRICLE PLAX 2D LVIDd:         4.10 cm     Diastology LVIDs:         3.00 cm     LV e' medial:    6.09 cm/s LV PW:         1.00 cm     LV E/e' medial:  7.9 LV IVS:        0.90 cm     LV e' lateral:   8.27 cm/s LVOT diam:     1.60 cm     LV E/e' lateral: 5.8 LV SV:         34 LV SV Index:   21 LVOT Area:     2.01 cm  LV Volumes (MOD) LV vol d, MOD A2C: 28.4 ml LV vol d, MOD A4C: 16.9 ml LV vol s, MOD A2C: 11.3 ml LV vol s, MOD A4C: 8.2 ml LV SV MOD A2C:     17.1 ml LV SV MOD A4C:     16.9 ml LV SV MOD BP:      13.8 ml RIGHT VENTRICLE RV S prime:     11.70 cm/s TAPSE (M-mode): 1.8 cm LEFT ATRIUM             Index       RIGHT ATRIUM           Index LA diam:        3.20 cm 1.93 cm/m  RA Area:     10.90 cm LA Vol (A2C):   52.9 ml 31.92 ml/m RA Volume:   27.10 ml  16.35 ml/m LA Vol (A4C):   22.9 ml 13.82 ml/m LA Biplane Vol: 36.1 ml 21.78 ml/m  AORTIC VALVE                   PULMONIC VALVE AV Area (Vmax):    1.51 cm    PV Vmax:       0.95 m/s AV Area (Vmean):   1.52 cm    PV Vmean:      64.200 cm/s AV Area (VTI):     1.58 cm    PV VTI:        0.196 m AV Vmax:           122.00 cm/s PV Peak grad:  3.6 mmHg AV Vmean:          83.800 cm/s PV Mean grad:  2.0 mmHg AV VTI:            0.217 m AV Peak Grad:      6.0 mmHg AV Mean Grad:      3.0 mmHg LVOT  Vmax:         91.70 cm/s LVOT Vmean:        63.200 cm/s LVOT VTI:          0.171 m LVOT/AV VTI ratio: 0.79  AORTA Ao Root diam:  2.50 cm Ao Asc diam:  3.00 cm MITRAL VALVE               TRICUSPID VALVE MV Area (PHT): 2.24 cm    TR Peak grad:   76.7 mmHg MV Decel Time: 338 msec    TR Vmax:        438.00 cm/s MV E velocity: 48.00 cm/s MV A velocity: 90.00 cm/s  SHUNTS MV E/A ratio:  0.53        Systemic VTI:  0.17 m                            Systemic Diam: 1.60 cm Lyman Bishop MD Electronically signed by Lyman Bishop MD Signature Date/Time: 12/10/2020/10:10:43 AM    Final     Microbiology: Recent Results (from the past 240 hour(s))  Resp Panel by RT-PCR (Flu A&B, Covid) Nasopharyngeal Swab     Status: None   Collection Time: 12/09/20  3:44 PM   Specimen: Nasopharyngeal Swab; Nasopharyngeal(NP) swabs in vial transport medium  Result Value Ref Range Status   SARS Coronavirus 2 by RT PCR NEGATIVE NEGATIVE Final    Comment: (NOTE) SARS-CoV-2 target nucleic acids are NOT DETECTED.  The SARS-CoV-2 RNA is generally detectable in upper respiratory specimens during the acute phase of infection. The lowest concentration of SARS-CoV-2 viral copies this assay can detect is 138 copies/mL. A negative result does not preclude SARS-Cov-2 infection and should not be used as the sole basis for treatment or other patient management decisions. A negative result may occur with  improper specimen collection/handling, submission of specimen other than nasopharyngeal swab, presence of viral mutation(s) within the areas targeted by this assay, and inadequate number of viral copies(<138 copies/mL). A negative result must be combined with clinical observations, patient history, and epidemiological information. The expected result is Negative.  Fact Sheet for Patients:  EntrepreneurPulse.com.au  Fact Sheet for Healthcare Providers:  IncredibleEmployment.be  This test is no t yet  approved or cleared by the [place] FDA and  has been authorized for detection and/or diagnosis of SARS-CoV-2 by FDA under an Emergency Use Authorization (EUA). This EUA will remain  in effect (meaning this test can be used) for the duration of the COVID-19 declaration under Section 564(b)(1) of the Act, 21 U.S.C.section 360bbb-3(b)(1), unless the authorization is terminated  or revoked sooner.       Influenza A by PCR NEGATIVE NEGATIVE Final   Influenza B by PCR NEGATIVE NEGATIVE Final    Comment: (NOTE) The Xpert Xpress SARS-CoV-2/FLU/RSV plus assay is intended as an aid in the diagnosis of influenza from Nasopharyngeal swab specimens and should not be used as a sole basis for treatment. Nasal washings and aspirates are unacceptable for Xpert Xpress SARS-CoV-2/FLU/RSV testing.  Fact Sheet for Patients: EntrepreneurPulse.com.au  Fact Sheet for Healthcare Providers: IncredibleEmployment.be  This test is not yet approved or cleared by the [place] FDA and has been authorized for detection and/or diagnosis of SARS-CoV-2 by FDA under an Emergency Use Authorization (EUA). This EUA will remain in effect (meaning this test can be used) for the duration of the COVID-19 declaration under Section 564(b)(1) of the Act, 21 U.S.C. section 360bbb-3(b)(1), unless the authorization is terminated or revoked.  Performed at [facility], [place] [address], [place], [place] [address]      Labs: Basic Metabolic Panel: Recent Labs  Lab 12/09/20 1333 12/10/20 0339  NA 137 139  K 4.4 3.9  CL 106 104  CO2 24 27  GLUCOSE 185* 136*  BUN 14 19  CREATININE 0.87 0.98  CALCIUM 9.4 9.5   Liver Function Tests: No results for input(s): AST, ALT, ALKPHOS, BILITOT, PROT, ALBUMIN in the last 168 hours. No results for input(s): LIPASE, AMYLASE in the last 168 hours. No results for input(s): AMMONIA in the last 168 hours. CBC: Recent Labs  Lab  12/09/20 1333  WBC 6.7  NEUTROABS 4.0  HGB 10.8*  HCT 35.8*  MCV 89.9  PLT 325   Cardiac Enzymes: No results for input(s): CKTOTAL, CKMB, CKMBINDEX, TROPONINI in the last 168 hours. BNP: BNP (last 3 results) Recent Labs    02/29/20 1304 03/01/20 0458 12/09/20 1510  BNP 1,357.6* 1,093.5* 224.9*    ProBNP (last 3 results) No results for input(s): PROBNP in the last 8760 hours.  CBG: Recent Labs  Lab 12/09/20 2114 12/10/20 0600 12/10/20 1136  GLUCAP 140* 141* 134*       Signed:  Kayleen Memos, MD [facility] 12/10/2020, 2:05 PM

## 2020-12-10 NOTE — Plan of Care (Signed)

## 2020-12-10 NOTE — TOC Transition Note (Signed)
Transition of Care [facility]) - CM/SW Discharge Note   Patient Details  Name: Brianna Adams MRN: [id number] Date of Birth: 1940-10-13  [facility]) CM/SW Contact:  Zenon Mayo, RN Phone Number: 12/10/2020, 2:16 PM   Clinical Narrative:    Patient is for dc today, NCM spoke  With daughter , she states they do not need any HH services, or DME.    Final next level of care: Home/Self Care Barriers to Discharge: No Barriers Identified   Patient Goals and CMS Choice Patient states their goals for this hospitalization and ongoing recovery are:: return home   Choice offered to / list presented to : NA  Discharge Placement                       Discharge Plan and Services                  DME Agency: NA                  Social Determinants of Health (SDOH) Interventions Food Insecurity Interventions: Intervention Not Indicated Financial Strain Interventions: Intervention Not Indicated Housing Interventions: Intervention Not Indicated Transportation Interventions: Intervention Not Indicated   Readmission Risk Interventions No flowsheet data found.

## 2020-12-10 NOTE — Progress Notes (Signed)
D/C instructions given and reviewed. Tele and IV removed, tolerated well. Daughter at bedside to transport home.

## 2020-12-10 NOTE — Progress Notes (Signed)
Heart Failure Nurse Navigator Progress Note  PCP: [name], [name] PCP-Cardiologist: none on file Admission Diagnosis: CHF Admitted from: home with daughter  Presentation:   Brianna Adams presented with SOB. Pt and daughter present at time of interview. Pt very pleasant and well educated. Laying in bed with chronic 3LPM O2 per [place]. States she doesn't cook anymore because she simply does not want to; her and daughter order in food often. Educated on dietary modifications with sodium intake and fluid restrictions in setting of heart failure.   ECHO/ LVEF: 55-60%, G1DD 8/20221: 50-55%  Clinical Course:  Past Medical History:  Diagnosis Date  . CHF (congestive heart failure) ([place])   . COPD (chronic obstructive pulmonary disease) ([place])   . Diabetes ([place])    type 2  . Diverticulitis   . Dyspnea    On Oxygen 3L via [place]   . GERD (gastroesophageal reflux disease)   . High cholesterol   . Lung cancer ([place])   . PE (pulmonary thromboembolism) ([place]) 02/29/2020  . Pulmonary hypertension ([place])   . Uterine cancer [facility])      Social History   Socioeconomic History  . Marital status: Widowed    Spouse name: Not on file  . Number of children: 1  . Years of education: Not on file  . Highest education level: Master's degree (e.g., MA, MS, MEng, MEd, MSW, MBA)  Occupational History  . Occupation: retired    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r-sr Pharmacist, hospital  Tobacco Use  . Smoking status: Former Smoker    Packs/day: 1.00    Years: 58.00    Pack years: 58.00    Types: Cigarettes    Quit date: 10/22/2015    Years since quitting: 5.1  . Smokeless tobacco: Never Used  . Tobacco comment: congratulated  Vaping Use  . Vaping Use: Never used  Substance and Sexual Activity  . Alcohol use: Never  . Drug use: Never  . Sexual activity: Not Currently    Birth control/protection: Surgical    Comment: HYSTERECTOMY  Other Topics Concern  . Not on file  Social History Narrative  . Not on file   Social  Determinants of Health   Financial Resource Strain: Low Risk   . Difficulty of Paying Living Expenses: Not hard at all  Food Insecurity: No Food Insecurity  . Worried About Charity fundraiser in the Last Year: Never true  . Ran Out of Food in the Last Year: Never true  Transportation Needs: No Transportation Needs  . Lack of Transportation (Medical): No  . Lack of Transportation (Non-Medical): No  Physical Activity: Insufficiently Active  . Days of Exercise per Week: 5 days  . Minutes of Exercise per Session: 20 min  Stress: No Stress Concern Present  . Feeling of Stress : Not at all  Social Connections: Not on file    High Risk Criteria for Readmission and/or Poor Patient Outcomes:  Heart failure hospital admissions (last 6 months): 1   No Show rate: 4%  Difficult social situation: no  Demonstrates medication adherence: yes  Primary Language: English  Literacy level: able to read/write and comprehend  Barriers of Care:   -dietary habits -no cardiologist  Considerations/Referrals:   Referral made to Heart Failure Pharmacist Stewardship: yes, appreciated Referral made to Heart & Vascular TOC clinic: yes, 4/15 @ 10AM  Items for Follow-up on DC/TOC: -medication optimization -medication cost -continue education on diet/fluid restrictions -cardiology  Pricilla Holm, RN, BSN Heart Failure Nurse Navigator [phone]

## 2020-12-10 NOTE — Progress Notes (Addendum)
Heart Failure Stewardship Pharmacist Progress Note   PCP: Brianna Brine, Brianna Adams PCP-Cardiologist: No primary care provider on file.    HPI:  80 yo F with PMH of chronic respiratory failure on 3L oxygen, COPD, CHF, history of PE, and diabetes. She presented to the ED on 12/09/20 with shortness of breath. An ECHO was done on 12/10/20 and LVEF was 55-60% (improved from 50-55% in 04/2020).   Current HF Medications: Furosemide 40 mg IV BID Losartan 25 mg daily  Prior to admission HF Medications: Furosemide 20 mg daily  Pertinent Lab Values: . Serum creatinine 0.98, BUN 19, Potassium 3.9, Sodium 139, BNP 224.9  Vital Signs: . Weight: 139 lbs (admission weight: 140 lbs) . Blood pressure: 110/60s  . Heart rate: 70s   Medication Assistance / Insurance Benefits Check: Does the patient have prescription insurance?  Yes Type of insurance plan: [place] Medicare  Outpatient Pharmacy:  [facility] Is the patient willing to use [facility] at discharge? Yes Is the patient willing to transition their outpatient pharmacy to utilize a [facility] outpatient pharmacy?   Pending    Assessment: 1. Acute on chronic diastolic CHF (EF 88-50%). NYHA class II symptoms. - Continue furosemide 40 mg IV BID - Continue losartan 25 mg daily - consider optimizing to Entresto 24/26 mg BID for HFpEF  - Consider starting SGLT2i prior to discharge for HFpEF and diabetes   Plan: 1) Medication changes recommended at this time: - Continue IV diuresis - Start Farxiga 10 mg daily  2) Patient assistance: - Farxiga/Jardiance copay $13.23 per month - Entresto copay $13.23 per month  3)  Education  - To be completed prior to discharge  Brianna Adams, PharmD, BCPS Heart Failure Cytogeneticist Phone [phone]

## 2020-12-11 ENCOUNTER — Telehealth: Payer: Self-pay

## 2020-12-11 NOTE — Telephone Encounter (Signed)
Transition Care Management Follow-up Telephone Call  Date of discharge and from where: 12/10/20  How have you been since you were released from the hospital? Better  Any questions or concerns? No   Items Reviewed:  Did the pt receive and understand the discharge instructions provided? yes  Medications obtained and verified? yes  Any new allergies since your discharge? No  Dietary orders reviewed? No   Do you have support at home? Yes  Other (ie: DME, Home Health, etc) No  Functional Questionnaire: (I = Independent and D = Dependent)  Bathing/Dressing- I  Meal Prep-D  Eating- I  Maintaining continence- I  Transferring/Ambulation- I  Managing Meds- I Per daughter needs reminding    Follow up appointments reviewed:    PCP Hospital f/u appt confirmed? Yes  Specialist Hospital f/u appt confirmed? Yes  Are transportation arrangements needed? No   If their condition worsens, is the pt aware to call  their PCP or go to the ED? Yes  Was the patient provided with contact information for the PCP's office or ED? Yes  Was the pt encouraged to call back with questions or concerns? Yes

## 2020-12-17 ENCOUNTER — Encounter (HOSPITAL_COMMUNITY): Payer: Medicare Other

## 2020-12-19 ENCOUNTER — Ambulatory Visit (HOSPITAL_COMMUNITY)
Admit: 2020-12-19 | Discharge: 2020-12-19 | Disposition: A | Discharge status: Home / Self Care | Payer: Medicare Other | Attending: Internal Medicine | Admitting: Internal Medicine

## 2020-12-19 ENCOUNTER — Telehealth (HOSPITAL_COMMUNITY): Payer: Self-pay

## 2020-12-19 ENCOUNTER — Encounter (HOSPITAL_COMMUNITY): Payer: Self-pay

## 2020-12-19 ENCOUNTER — Other Ambulatory Visit: Payer: Self-pay

## 2020-12-19 VITALS — BP 130/60 | HR 84 | Wt 143.6 lb

## 2020-12-19 DIAGNOSIS — J9611 Chronic respiratory failure with hypoxia: Secondary | ICD-10-CM | POA: Diagnosis not present

## 2020-12-19 DIAGNOSIS — Z79899 Other long term (current) drug therapy: Secondary | ICD-10-CM | POA: Diagnosis not present

## 2020-12-19 DIAGNOSIS — Z86711 Personal history of pulmonary embolism: Secondary | ICD-10-CM | POA: Diagnosis not present

## 2020-12-19 DIAGNOSIS — I5032 Chronic diastolic (congestive) heart failure: Secondary | ICD-10-CM | POA: Diagnosis not present

## 2020-12-19 DIAGNOSIS — J9610 Chronic respiratory failure, unspecified whether with hypoxia or hypercapnia: Secondary | ICD-10-CM | POA: Diagnosis not present

## 2020-12-19 DIAGNOSIS — Z87891 Personal history of nicotine dependence: Secondary | ICD-10-CM | POA: Insufficient documentation

## 2020-12-19 DIAGNOSIS — J449 Chronic obstructive pulmonary disease, unspecified: Secondary | ICD-10-CM | POA: Insufficient documentation

## 2020-12-19 DIAGNOSIS — Z9981 Dependence on supplemental oxygen: Secondary | ICD-10-CM | POA: Diagnosis not present

## 2020-12-19 DIAGNOSIS — Z7901 Long term (current) use of anticoagulants: Secondary | ICD-10-CM | POA: Insufficient documentation

## 2020-12-19 DIAGNOSIS — E7800 Pure hypercholesterolemia, unspecified: Secondary | ICD-10-CM | POA: Diagnosis not present

## 2020-12-19 DIAGNOSIS — Z8249 Family history of ischemic heart disease and other diseases of the circulatory system: Secondary | ICD-10-CM | POA: Insufficient documentation

## 2020-12-19 DIAGNOSIS — C3490 Malignant neoplasm of unspecified part of unspecified bronchus or lung: Secondary | ICD-10-CM | POA: Diagnosis not present

## 2020-12-19 DIAGNOSIS — E119 Type 2 diabetes mellitus without complications: Secondary | ICD-10-CM | POA: Diagnosis not present

## 2020-12-19 DIAGNOSIS — I272 Pulmonary hypertension, unspecified: Secondary | ICD-10-CM

## 2020-12-19 MED ORDER — EMPAGLIFLOZIN 10 MG PO TABS
10.0000 mg | ORAL_TABLET | Freq: Every day | ORAL | 3 refills | Status: DC
Start: 2020-12-19 — End: 2021-04-30

## 2020-12-19 MED ORDER — EMPAGLIFLOZIN 10 MG PO TABS: 10.0000 mg | ORAL_TABLET | Freq: Every day | ORAL | Status: DC

## 2020-12-19 MED ORDER — LOSARTAN POTASSIUM 25 MG PO TABS: 12.5000 mg | ORAL_TABLET | Freq: Every day | ORAL | 3 refills | Status: DC

## 2020-12-19 NOTE — Telephone Encounter (Signed)
Called to confirm Heart & Vascular Transitions of Care appointment at [facility] and daughter, [name], will both be here.  Patient reminded to bring all medications and pill box organizer with them. Confirmed patient has transportation. Gave directions, instructed to utilize [facility] parking.  Confirmed appointment prior to ending call.   Pricilla Holm, RN, BSN Heart Failure Nurse Navigator [phone]

## 2020-12-19 NOTE — Progress Notes (Signed)
Heart and Vascular Center Transitions of Care Clinic  PCP: [name] Primary Cardiologist: None  HPI:  Brianna Adams is a 80 y.o.  female  with a PMH significant for COPD on 3 L of oxygen, Hx squamous cell carcinoma lung, s/p RLL wedge resection in 4196, chronic diastolic heart failure, history of pulmonary embolism on anticoagulation, T2DM  Admitted 02/2020 with acute PE, had an ECHO as a part of her workup which demonstrated normal EF and normal diastolic function but with moderate RV enlargement, severe PA pressure estimate and mild RV dysfunction . She was started on lasix 29m after discharge.  Had a cardiology follow up about a month later was doing well was released from further follow up.    Majority of patient's respiratory failure over the years seems to have been centered around emphysematous COPD, the pulmonary embolism described above and group III PH.  She is followed closely by pulmonology.  Getting surveillance CT scans for lung cancer, they are adjusting her inhaler therapy.     12/2020 was admitted for acute on chronic respiratory failure with evidence of acute diastolic CHF with elevated BNP to 240, pulmonary edema on chest x-ray.  She had very little signs of COPD exacerbation and her presentation was felt to be predominantly diastolic heart failure exacerbation. She had an ECHO during this admission with normal EF, G1DD, Moderately Enlarged RV but with normal function. She was given lasix 870mx1 and then 4055m1 the following day and weight decreased by 2kg down to 63kg.  She was started on losartan for renal protection and diastolic chf benefit but held on discharge due to hypotension.    Breathing has been better since discharge from the hospital.  Taking lasix daily, has a scale but not weighing herself.  Loading and unloading the dishwasher not getting short of breath.  Has a recumbent bike at home that she uses does about 1000 revolutions before stopping but doesn't  feel winded, [facility].      ROS: All systems negative except as listed in HPI, PMH and Problem List.  SH:  Social History   Socioeconomic History  . Marital status: Widowed    Spouse name: Not on file  . Number of children: 1  . Years of education: Not on file  . Highest education level: Master's degree (e.g., MA, MS, MEng, MEd, MSW, MBA)  Occupational History  . Occupation: retired    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r-sr teaPharmacist, hospitalobacco Use  . Smoking status: Former Smoker    Packs/day: 1.00    Years: 58.00    Pack years: 58.00    Types: Cigarettes    Quit date: 10/22/2015    Years since quitting: 5.1  . Smokeless tobacco: Never Used  . Tobacco comment: congratulated  Vaping Use  . Vaping Use: Never used  Substance and Sexual Activity  . Alcohol use: Never  . Drug use: Never  . Sexual activity: Not Currently    Birth control/protection: Surgical    Comment: HYSTERECTOMY  Other Topics Concern  . Not on file  Social History Narrative  . Not on file   Social Determinants of Health   Financial Resource Strain: Low Risk   . Difficulty of Paying Living Expenses: Not hard at all  Food Insecurity: No Food Insecurity  . Worried About RunCharity fundraiser the Last Year: Never true  . Ran Out of Food in the Last Year: Never  true  Transportation Needs: No Transportation Needs  . Lack of Transportation (Medical): No  . Lack of Transportation (Non-Medical): No  Physical Activity: Insufficiently Active  . Days of Exercise per Week: 5 days  . Minutes of Exercise per Session: 20 min  Stress: No Stress Concern Present  . Feeling of Stress : Not at all  Social Connections: Not on file  Intimate Partner Violence: Not on file    FH:  Family History  Problem Relation Age of Onset  . Hypertension Mother   . Heart attack Father   . Cancer Maternal Aunt     Past Medical History:  Diagnosis Date  . CHF (congestive heart  failure) ([place])   . COPD (chronic obstructive pulmonary disease) ([place])   . Diabetes ([place])    type 2  . Diverticulitis   . Dyspnea    On Oxygen 3L via [place]   . GERD (gastroesophageal reflux disease)   . High cholesterol   . Lung cancer ([place])   . PE (pulmonary thromboembolism) ([place]) 02/29/2020  . Pulmonary hypertension ([place])   . Uterine cancer [facility])     Current Outpatient Medications  Medication Sig Dispense Refill  . acetaminophen (TYLENOL) 500 MG tablet Take 1,000 mg by mouth every 6 (six) hours as needed for mild pain or fever.     Marland Kitchen albuterol (VENTOLIN HFA) 108 (90 Base) MCG/ACT inhaler Inhale 2 puffs into the lungs every 6 (six) hours as needed for wheezing or shortness of breath. 18 g 0  . Artificial Tear Ointment (DRY EYES OP) Apply 1 drop to eye every morning.    . calcium carbonate (OSCAL) 1500 (600 Ca) MG TABS tablet Take 1,200 mg by mouth daily.    . Cinnamon 500 MG TABS Take 1,000 mg by mouth daily.     Marland Kitchen dextromethorphan-guaiFENesin (ROBITUSSIN-DM) 10-100 MG/5ML liquid Take 10 mLs by mouth at bedtime.    Marland Kitchen ELIQUIS 5 MG TABS tablet TAKE 1 TABLET BY MOUTH TWICE A DAY (Patient taking differently: Take 5 mg by mouth 2 (two) times daily.) 60 tablet 2  . empagliflozin (JARDIANCE) 10 MG TABS tablet Take 1 tablet (10 mg total) by mouth daily before breakfast. 30 tablet 3  . furosemide (LASIX) 20 MG tablet TAKE 1 TABLET BY MOUTH EVERY DAY (Patient taking differently: Take 20 mg by mouth daily.) 90 tablet 2  . losartan (COZAAR) 25 MG tablet Take 0.5 tablets (12.5 mg total) by mouth daily. 90 tablet 3  . metFORMIN (GLUCOPHAGE) 1000 MG tablet TAKE 1 TABLET (1,000 MG TOTAL) BY MOUTH 2 (TWO) TIMES DAILY WITH A MEAL. 180 tablet 0  . Multiple Vitamins-Minerals (OCUVITE EYE HEALTH FORMULA PO) Take 1 capsule by mouth daily.    Marland Kitchen omeprazole (PRILOSEC) 40 MG capsule TAKE 1 CAPSULE BY MOUTH EVERY DAY (Patient taking differently: Take 40 mg by mouth daily.) 90 capsule 1  . pravastatin (PRAVACHOL) 10  MG tablet TAKE 1 TABLET BY MOUTH EVERY DAY (Patient taking differently: Take 10 mg by mouth daily.) 90 tablet 1  . umeclidinium-vilanterol (ANORO ELLIPTA) 62.5-25 MCG/INH AEPB Inhale 1 puff into the lungs daily. 60 each 5  . vitamin C (ASCORBIC ACID) 500 MG tablet Take 500 mg by mouth every other day.    . vitamin E 1000 UNIT capsule Take 1,000 Units by mouth every Monday, Wednesday, and Friday at 6 PM.     No current facility-administered medications for this encounter.    Vitals:   12/19/20 1033  BP: 130/60  Pulse: 84  SpO2: (!) 88%  Weight: 65.1 kg    PHYSICAL EXAM: Cardiac: JVD flat, normal rate and rhythm, clear s1 and s2, no murmurs, rubs or gallops, no LE edema Pulmonary: coarse breath sounds but clear, no wheezing, not in distress Abdominal: non distended abdomen, soft and nontender Psych: Alert, conversant, in good spirits    ASSESSMENT & PLAN: Chronic Diastolic CHF: -PE 09/7492 and ECHO with normal EF and normal diastolic function but with moderate RV enlargement, severe PA pressure estimate and mild RV dysfunction -Repeat ECHO 12/2020 normal EF, G1DD, Moderately Enlarged RV but with normal function (by my interpretation RV function remains slightly abnormal) -NYHA Class II symptoms, euvolemic on exam -Plenty of bp today, Start back losartan but at 12.23m, start jardiance 10  Pulmonary Hypertension: -Diagnosed by ECHO, never had a RHC  -likely mainly group III, some contribution from PE last year uncertain if CTEPH, maybe slight contribution from group II  -followed by pulmonology, COPD appears to be under excellent control -recommend sleep study to test for OSA -continue eliquis 545mBID  COPD, Chronic Respiratory Failure: -on 3-4L supplemental O2: -told her to keep her O2 sats 92-96% given pulmonary hypertension and COPD  -Followed by pulmonology COPD well controlled, she would like to switch to [employer] Inc[employer]ffice      Follow up with Dr. CrSallyanne Kuster

## 2020-12-19 NOTE — Patient Instructions (Signed)
Nice to see you today!  Please start Losartan 12.5 mg daily  Also start Jardiance 10 mg daily  Follow-up with Dr. Recardo Evangelist at [facility] at [place].  They will call you to schedule an appt.

## 2020-12-19 NOTE — Progress Notes (Signed)
Heart and Vascular Center Transitions of Care Clinic Heart Failure Pharmacist Encounter  HPI:  80 yo F with PMH of chronic respiratory failure on 3L oxygen, squamous cell carcinoma lung s/pRLLwedge resection in 2018, COPD, CHF, history of PE, and diabetes. She presented to the ED on 12/09/20 with shortness of breath. She was taking furosemide 20 mg daily prior to admission. She received IV lasix 40 mg x 3 and put out about 3L of fluid. An ECHO was done on 12/10/20 and LVEF was 55-60% (improved from 50-55% in 04/2020). Losartan 25 mg daily was initiated during this admission but then was discontinued at discharge on 12/10/20.  Today, Brianna Adams presents to the [facility] for follow up. She denies having shortness of breath, DOE, orthopnea/PND, lightheadedness or dizziness. She is not edematous on exam. She has not been checking weights daily and does not have a BP cuff at home. She has been following a low-sodium and fluid-restricted diet and has been taking all medications as prescribed. She uses a pill box to keep her organized. She states that she takes her furosemide at noon every day because this allows her to complete her errands, attending church, etc. early in the morning without having to use the restroom while she's out of the home.   HF Medications: Furosemide 20 mg daily  Has the patient been experiencing any side effects to the medications prescribed?  no  Does the patient have any problems obtaining medications due to transportation or finances?   no  Understanding of regimen: good Understanding of indications: good Potential of compliance: good Patient understands to avoid NSAIDs. Patient understands to avoid decongestants.   Pertinent Lab Values:  Serum creatinine 0.98, BUN 19, Potassium 3.9, Sodium 139, BNP 224.9  Vital Signs: . Weight: 143 lbs (discharge weight: 139 lbs) . Blood pressure: 130/60  . Heart rate: 84   Medication Assistance / Insurance Benefits  Check: Does the patient have prescription insurance?  Yes Type of insurance plan: BCBS Medicare  Outpatient Pharmacy:  [facility] the [place] used to supply discharge medications? no  Is the patient willing to transition their outpatient pharmacy to utilize a [facility] outpatient pharmacy with or without mail order?   No - educated her to call [facility] outpatient pharmacy if she would like to move forward with this transition in the future.  Assessment: 1) Chronic diastolic CHF (EF 16-61%). NYHA class II symptoms. - Continue furosemide 20 mg daily - Start losartan 12.5 mg daily - Start Jardiance 10 mg daily  Plan: 1) Medication changes: - Start losartan 12.5 mg daily - Start Jardiance 10 mg daily  2) Patient Assistance: - Farxiga/Jardiance copay $13.23 per month - Entresto copay $13.23 per month  3) Follow up: - Next appointment with [facility] for hospital follow up on 12/22/20  Kerby Nora, PharmD, BCPS Heart Failure Transitions of Care Clinic Pharmacist [phone]

## 2020-12-22 ENCOUNTER — Ambulatory Visit (INDEPENDENT_AMBULATORY_CARE_PROVIDER_SITE_OTHER): Payer: Medicare Other | Admitting: Nurse Practitioner

## 2020-12-22 ENCOUNTER — Other Ambulatory Visit: Payer: Self-pay

## 2020-12-22 VITALS — BP 126/66 | HR 100 | Temp 98.1°F | Ht 63.0 in | Wt 143.8 lb

## 2020-12-22 DIAGNOSIS — I509 Heart failure, unspecified: Secondary | ICD-10-CM | POA: Diagnosis not present

## 2020-12-22 DIAGNOSIS — I272 Pulmonary hypertension, unspecified: Secondary | ICD-10-CM

## 2020-12-22 DIAGNOSIS — E119 Type 2 diabetes mellitus without complications: Secondary | ICD-10-CM | POA: Diagnosis not present

## 2020-12-22 DIAGNOSIS — J449 Chronic obstructive pulmonary disease, unspecified: Secondary | ICD-10-CM | POA: Diagnosis not present

## 2020-12-22 DIAGNOSIS — Z86711 Personal history of pulmonary embolism: Secondary | ICD-10-CM

## 2020-12-22 NOTE — Progress Notes (Signed)
This visit occurred during the SARS-CoV-2 public health emergency.  Safety protocols were in place, including screening questions prior to the visit, additional usage of staff PPE, and extensive cleaning of exam room while observing appropriate contact time as indicated for disinfecting solutions.  Subjective:     Patient ID: Brianna Adams , female    DOB: [birth year] , 80 y.o.   MRN: [id number]   Chief Complaint  Patient presents with  . Hospitalization Follow-up    HPI  Patient is here for a follow up from the hospital. She went to the hospital with dyspnea for about 2 weeks without chest pain or edema. She was not taking her lasix daily. Since discharge she has been feeling a lot better. She is on 3L of oxygen at home. She does not weigh herself at home at this time but her daughter stated she would be doing that soon. She tries to do things at home without getting short of breath but when she does too much she does get SOB. She saw the cardiologist on 4/15. She is also scheduled to see the pulmonologist at the end of this month. There were 2 medications that were added upon discharge which is the Jardiance and the losartan. She does not eat a lot of red meat.  She does eat a lot of vegetables, salmon and chicken. She eats salt but not a lot. She drinks water and drinks her coffee in the morning. No other complaints. She is not checking her BS at home. Her daughter was here today with her.      Past Medical History:  Diagnosis Date  . CHF (congestive heart failure) ([place])   . COPD (chronic obstructive pulmonary disease) ([place])   . Diabetes ([place])    type 2  . Diverticulitis   . Dyspnea    On Oxygen 3L via [place]   . GERD (gastroesophageal reflux disease)   . High cholesterol   . Lung cancer ([place])   . PE (pulmonary thromboembolism) ([place]) 02/29/2020  . Pulmonary hypertension ([place])   . Uterine cancer ([place])      Family History  Problem Relation Age of Onset  . Hypertension Mother   .  Heart attack Father   . Cancer Maternal Aunt      Current Outpatient Medications:  .  acetaminophen (TYLENOL) 500 MG tablet, Take 1,000 mg by mouth every 6 (six) hours as needed for mild pain or fever. , Disp: , Rfl:  .  albuterol (VENTOLIN HFA) 108 (90 Base) MCG/ACT inhaler, Inhale 2 puffs into the lungs every 6 (six) hours as needed for wheezing or shortness of breath., Disp: 18 g, Rfl: 0 .  Artificial Tear Ointment (DRY EYES OP), Apply 1 drop to eye every morning., Disp: , Rfl:  .  calcium carbonate (OSCAL) 1500 (600 Ca) MG TABS tablet, Take 1,200 mg by mouth daily., Disp: , Rfl:  .  Cinnamon 500 MG TABS, Take 1,000 mg by mouth daily. , Disp: , Rfl:  .  dextromethorphan-guaiFENesin (ROBITUSSIN-DM) 10-100 MG/5ML liquid, Take 10 mLs by mouth at bedtime., Disp: , Rfl:  .  ELIQUIS 5 MG TABS tablet, TAKE 1 TABLET BY MOUTH TWICE A DAY (Patient taking differently: Take 5 mg by mouth 2 (two) times daily.), Disp: 60 tablet, Rfl: 2 .  empagliflozin (JARDIANCE) 10 MG TABS tablet, Take 1 tablet (10 mg total) by mouth daily before breakfast., Disp: 30 tablet, Rfl: 3 .  furosemide (LASIX) 20 MG tablet, TAKE 1 TABLET BY MOUTH EVERY  DAY (Patient taking differently: Take 20 mg by mouth daily.), Disp: 90 tablet, Rfl: 2 .  losartan (COZAAR) 25 MG tablet, Take 0.5 tablets (12.5 mg total) by mouth daily., Disp: 90 tablet, Rfl: 3 .  metFORMIN (GLUCOPHAGE) 1000 MG tablet, TAKE 1 TABLET (1,000 MG TOTAL) BY MOUTH 2 (TWO) TIMES DAILY WITH A MEAL., Disp: 180 tablet, Rfl: 0 .  Multiple Vitamins-Minerals (OCUVITE EYE HEALTH FORMULA PO), Take 1 capsule by mouth daily., Disp: , Rfl:  .  omeprazole (PRILOSEC) 40 MG capsule, TAKE 1 CAPSULE BY MOUTH EVERY DAY (Patient taking differently: Take 40 mg by mouth daily.), Disp: 90 capsule, Rfl: 1 .  pravastatin (PRAVACHOL) 10 MG tablet, TAKE 1 TABLET BY MOUTH EVERY DAY (Patient taking differently: Take 10 mg by mouth daily.), Disp: 90 tablet, Rfl: 1 .  umeclidinium-vilanterol (ANORO  ELLIPTA) 62.5-25 MCG/INH AEPB, Inhale 1 puff into the lungs daily., Disp: 60 each, Rfl: 5 .  vitamin C (ASCORBIC ACID) 500 MG tablet, Take 500 mg by mouth every other day., Disp: , Rfl:  .  vitamin E 1000 UNIT capsule, Take 1,000 Units by mouth every Monday, Wednesday, and Friday at 6 PM., Disp: , Rfl:    Allergies  Allergen Reactions  . Ace Inhibitors Swelling  . Lisinopril Swelling  . Penicillin G Hives, Shortness Of Breath and Rash  . Atorvastatin Other (See Comments)    Myalgias (Muscle Pain)   . Cortisone Other (See Comments)    Unknown reaction  . Hydrocortisone Other (See Comments)    Unknown reaction  . Meloxicam Other (See Comments)    Bloody stools  . Pravastatin Other (See Comments)    Chest Pain if high dosage      Review of Systems  Constitutional: Negative.   HENT: Negative for congestion, sinus pressure and sinus pain.   Respiratory: Negative.  Negative for shortness of breath and wheezing.   Cardiovascular: Negative.  Negative for chest pain, palpitations and leg swelling.  Gastrointestinal: Negative.  Negative for abdominal pain, constipation and diarrhea.  Neurological: Negative.      Today's Vitals   12/22/20 1507  BP: 126/66  Pulse: 100  Temp: 98.1 F (36.7 C)  TempSrc: Oral  SpO2: 90%  Weight: 143 lb 12.8 oz (65.2 kg)  Height: _0  (1.6 m)   Body mass index is 25.47 kg/m.  Wt Readings from Last 3 Encounters:  12/22/20 143 lb 12.8 oz (65.2 kg)  12/19/20 143 lb 9.6 oz (65.1 kg)  12/10/20 139 lb 1.8 oz (63.1 kg)    Objective:  Physical Exam Constitutional:      Appearance: Normal appearance.  HENT:     Head: Normocephalic and atraumatic.  Cardiovascular:     Rate and Rhythm: Normal rate and regular rhythm.     Pulses: Normal pulses.     Heart sounds: Normal heart sounds, S1 normal and S2 normal. No murmur heard.   Pulmonary:     Effort: Pulmonary effort is normal. No respiratory distress.     Breath sounds: Normal breath sounds. No  wheezing or rales.  Musculoskeletal:     Cervical back: Normal range of motion and neck supple.     Right lower leg: No edema.     Left lower leg: No edema.  Skin:    General: Skin is warm and dry.     Capillary Refill: Capillary refill takes less than 2 seconds.  Neurological:     Mental Status: She is alert.  Assessment And Plan:     1. Acute on chronic congestive heart failure, unspecified heart failure type ([place]) -Stable, continue meds  -Continue 20 mg lasix daily  -Follow up with cardiologist  -Patient saw cardiologist on 4/15. Started losartan 12.34m daily  -Advised patient to check her weight daily    2. Type 2 diabetes mellitus without complication, without long-term current use of insulin ([facility]) -Chronic, Stable -Continue meds  -Jardiance 10 mg was started by cardiologist. - Educated patient about checking her BS at home and the results of uncontrolled diabetes.   3. History of pulmonary embolism -Continue taking eliquis 5 mg BID -Follow up with pulmonologist   4. Pulmonary hypertension, unspecified ([place] -Chronic, stable  -Continue meds  -Follow up with cardiologist  -Instructed patient to decrease intake of salt and processed foods.   5. Chronic obstructive pulmonary disease, unspecified COPD type [place]  -Follow up with pulmonologist  -continue using inhaler  -Continue O2 3L at home.  -Continue meds at home   No changes to meds. Today. Patient feels a lot better than when she was in the hospital. Continue follow up with cardiologist and pulmonologist.    Patient was given opportunity to ask questions. Patient verbalized understanding of the plan and was able to repeat key elements of the plan. All questions were answered to their satisfaction.  RBary Castilla DNP   I, Boen Sterbenz DNP , have reviewed all documentation for this visit. The documentation on 12/22/20 for the exam, diagnosis, procedures, and orders are all accurate and complete.     IF YOU HAVE BEEN REFERRED TO A SPECIALIST, IT MAY TAKE 1-2 WEEKS TO SCHEDULE/PROCESS THE REFERRAL. IF YOU HAVE NOT HEARD FROM US/SPECIALIST IN TWO WEEKS, PLEASE GIVE [place] CALL AT [phone].   THE PATIENT IS ENCOURAGED TO PRACTICE SOCIAL DISTANCING DUE TO THE COVID-19 PANDEMIC.

## 2020-12-22 NOTE — Patient Instructions (Signed)
Heart Failure and Exercise Heart failure is a condition in which the heart does not fill or pump enough blood and oxygen to support your body and its functions. Heart failure is a long-term (chronic) condition. Living with heart failure can be challenging. However, following your health care provider's instructions about a healthy lifestyle may help improve your symptoms. This includes choosing the right exercise plan. Doing daily physical activity is important after a diagnosis of heart failure. You may have some activity restrictions, so talk to your health care provider before doing any exercises. What are the benefits of exercise? Exercise may:  Make your heart muscles stronger.  Lower your blood pressure and cholesterol.  Help you lose weight.  Help your bones stay strong.  Improve your blood circulation.  Help your body use oxygen better. This relieves symptoms such as fatigue and shortness of breath.  Help your mental health by lowering the risk of depression and other problems.  Improve your quality of life.  Decrease your chance of hospital admission for heart failure. What is an exercise plan? An exercise plan is a set of specific exercises and training activities. You will work with your health care provider to create the exercise plan that works for you. The plan may include:  Different types of exercises and how to do them.  Cardiac rehabilitation exercises. These are supervised programs that are designed to strengthen your heart. What are strengthening exercises? Strengthening exercises are a type of physical activity that involves using resistance to improve your muscle strength. Strengthening exercises usually have repetitive motions. These types of exercises can include:  Lifting weights.  Using weight machines.  Using resistance tubes and bands.  Using kettlebells.  Using your body weight, such as doing push-ups or squats. What are balance  exercises? Balance exercises are another type of physical activity. They strengthen the muscles of the back, abdomen, and pelvis (core muscles) and improve your balance. They can also lower your risk of falling. These types of exercises can include:  Standing on one leg.  Walking backward, sideways, and in a straight line.  Standing up after sitting, without using your hands.  Shifting your weight from one leg to the other.  Lifting one leg in front of you.  Doing tai chi. This is a type of exercise that uses slow movements and deep breathing. How can I increase my flexibility? Having better flexibility can keep you from falling. It can also lengthen your muscles, improve your range of motion, and help your joints. You can increase your flexibility by:  Doing tai chi.  Doing yoga.  Stretching.   How much aerobic exercise should I get? Aerobic exercise strengthens your breathing and circulation system and increases your body's use of oxygen. This type of exercise causes your heart to beat faster while you are doing it. Examples of aerobic exercise include biking, walking, running, and swimming. Talk to your health care provider to find out how much aerobic exercise is safe for you. To do this type of exercise:  Start exercising slowly, limiting the amount of time at first. You may need to start with 5 minutes of aerobic exercise every day.  Slowly add more minutes until you can safely do at least 30 minutes of exercise at least 5 days a week.   Summary  Daily physical activity is important after a diagnosis of heart failure.  Exercise can make your heart muscles stronger. It also offers other benefits that will improve your health.  Exercise   can decrease your chance of hospital admission for heart failure.  Talk to your health care provider before doing any exercises. This information is not intended to replace advice given to you by your health care provider. Make sure you  discuss any questions you have with your health care provider. Document Revised: 04/07/2020 Document Reviewed: 04/07/2020 Elsevier Patient Education  2021 [employer].

## 2020-12-30 NOTE — Progress Notes (Signed)
_0  ID: Brianna Adams, female    DOB: 07/27/1941, 80 y.o.   MRN: [id number]  Chief Complaint  Patient presents with  . Follow-up    Sob.    Referring provider: [name], [name]  HPI: 80 year-old female, former smoker quit 2017.  Past medical history significant for chronic obstructive pulmonary disease, pulmonary hypertension, diabetes type 2, squamous cell carcinoma of the lung, diastolic heart failure, GERD.  Patient of Dr. [name].  Previous [facility] pulmonary encounters: 6/14/2021Thomasenia Adams Patient presents today for regular follow-up.  CT chest obtained in January 2021 showed emphysema, no convincing evidence of tumor re-occurance. She did have a small mediastinal nodule that is stable versus slightly increased from her prior PET.   Accompanied by her daughter. She did not notice a significant improvement in her breathing on Anoro but does prefer it to Spiriva. She would like a refer to pulmonary rehab. She has home oxygen, she wears 1L at night. She has portable oxygen machines which she uses when she needs. Her O2 level was 86% today on room air which came up to 95% on 2L. She does not have a pulse oximeter at home. Her POC is in the car today. She reports decrease in her endurance, states that when she even walks a short distance she will get out of breath. Daughter states that she is fairly physically fit and tends to walk very fast then becomes short of breath. She takes omeprazole 71m in the morning for reflux. She is on Janumet for her diabetes, glimepiride was discontinued. Denies chest tightness, wheezing or cough. She has had both COVID vaccine.   04/09/20- Dr. GPatsey BertholdThis is an 80year old former smoker (40 pack years quit 2017) with a history of squamous cell carcinoma involving the right middle lobe and right lower lobe, [place]  She then underwent radiation therapy to a new left upper lobe nodule that was PET positive.  Patient  previously was followed here by Dr. DMerton BorderThe patient most recently had been seen on 18 February 2020 of up repeat CT chest was ordered.  She had had no convincing evidence of recurrence on a CT of January 2021.  The patient also during that visit was scheduled for pulmonary rehabilitation.  However [facility].  [place].  Evaluation of the emergency room revealed the patient had pulmonary emboli and right heart strain.  She was noted also to require 6 L of oxygen.  Aside from pulmonary embolism it was also suspected the patient had pneumonia.  Performed on 26 June showed ejection fraction of 55 to 60%, reduced right ventricular systolic function with an large right ventricular size and severely elevated pulmonary artery systolic pressure.  She also did have moderate mitral valve regurgitation.  She has been noted to have a systolic murmur on prior physical exam.  2D echo since that time.  She has been released by cardiology who followed her transiently for elevated troponins during her admission.  Patient she also was noted to have decompensation of diastolic congestive heart failure.  Since her release from the hospital she has felt very short of breath.  She is on 2 L pulsed of oxygen which she feels is inadequate when she exerts herself.  She had pulmonary function testing performed on15 July 2021, as previous,  lung volumes were invalid.  She has persistently decreased DLCO at 49%, unchanged from prior no overt obstruction however there is significant bronchodilator response.  Testing limited by patient's inability to perform the test well.  Patient would like to be referred to pulmonary rehab.  Trial Trelegy on hold given hx severe pulmonary HTN until 2D echo  9/27/2021[name] Patient presents today for 6 week follow-up. PE in June, on  anticoagulation with Eliquis (will likely need lifelong). 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 Previous PFT showed component of reversibility, would likely benefit from inhaled corticosteriods.  Trial Trelegy was put on hold d/t pulmonary HTN until after echo completed. She was ordered for repeat CT chest d/t lymphadenopathy after pulmonary embolism. Continues 3L oxygen.   She continues to experience dyspnea on exertion. She feels she has improved some. She currently attending pulmonary rehab. She is still using Anoro 1 puff daily. She has not required her rescue inhaler. Complaint with blood thinner, taking Eliquis 46m twice daily. She had previously been inactive prior to developing PE. They were on a trip and she was using her wheelchair more than usual. Activity level has improved, she exercises every day. She has a gaglion cyst on her left wrist, scheduled for surgery.   She has not had repeat CT chest that was ordered. Echocardiogram appears slightly improved from previous; her ejection fraction remains baseline 50-55%, grade 1 DD. Moderate elevated pulmonary artery systolic pressure. Right ventricular systolic function not well visualized, normal size. Denies fever, chills, cough, wheezing or chest tightness.   08/13/20- Dr. GNoralee Spaceis a delightful 80year old former smoker (40 pack years quit 2017) with a history of squamous cell carcinoma involving the right middle lobe and right lower lobe [place] Patient followed this with radiation therapy to a left upper lobe that was found on surveillance CT post resection. The patient had pneumonia and [facility] She has been on Eliquis for her PE. Given her prior history of malignancy, relatively sedentary status, and other comorbidities she will require anticoagulation long-term. The patient also has significant issues with  COPD and is on Anoro Ellipta.  She had pulmonary function testing performed on15 July 2021, as previous, lung volumes were invalid.  She has persistently decreased DLCO at 49%, unchanged from prior no overt obstruction however there is significant bronchodilator response.  Testing limited by patient's inability to perform the test well.  She has been participating in pulmonary rehab and has noted marked improvement in her symptoms with this therapy.  She has been markedly improved with regards to her dyspnea.  She continues to have some baseline dyspnea but feels this is manageable.  She has been compliant with oxygen therapy.  Her current liter flow of oxygen is 3 L/min continuous. She notes improvement with Anoro daily. She has underlying emphysema by CT scan.  Her only concern today is that she would like to be cleared for excision of a ganglion cyst on her left wrist. Previously we had to delay this due to the fact that she had had a recent PE and needed at least 3 to 4 months therapy on anticoagulation due to her significant decompensation from PE.  Review of Systems A 10 point review of systems was performed and it is as noted above otherwise negative.   12/31/2020- Interim hx  Patient presents today for  3 month follow-up. Her breathing is much better. She reports lasix has helped, she has been more compliant with taking her diuretic. Newly on Jardiance and losartan. She is using Anoro daily. She does not use albuterol that often. She does not have a cough but she has chest congestion. She takes mucinex without much relief. Use oxygen while at home 24/7 and also has portable concentrator. O2 91% RA today, she did not bring her oxygen with her. Imaging showed severe emphysema and progressive fibrosis in October 2021. CAT 17   Imaging: 12/09/19 CT chest:  Moderate centrilobular and paraseptal emphysematous changes, upper lung predominant.  Small mediastinal nodules, including 56m short axis AP  window node, stable versus minimally increased from PET. No convincing findings to suggest tumor recurrence.   06/26/20 CT chest- Stable severe emphysematous changes and areas of pulmonary scarring. Progressive consolidation of the lingular process, likely radiation fibrosis. No new/acute pulmonary findings or new pulmonary nodules to suggest pulmonary metastatic disease  Pulmonary function test done in 2019 showed no obvious signs of obstruction or restriction.  Post -bronchodilator FEV1 1.73 (93%), ratio 74.  Allergies  Allergen Reactions  . Ace Inhibitors Swelling  . Lisinopril Swelling  . Penicillin G Hives, Shortness Of Breath and Rash  . Atorvastatin Other (See Comments)    Myalgias (Muscle Pain)   . Cortisone Other (See Comments)    Unknown reaction  . Hydrocortisone Other (See Comments)    Unknown reaction  . Meloxicam Other (See Comments)    Bloody stools  . Pravastatin Other (See Comments)    Chest Pain if high dosage     Immunization History  Administered Date(s) Administered  . Fluad Quad(high Dose 65+) 05/24/2019, 05/13/2020  . Influenza-Unspecified 06/06/2018  . PFIZER(Purple Top)SARS-COV-2 Vaccination 10/20/2019, 11/19/2019, 06/11/2020  . Pneumococcal Conjugate-13 10/31/2013  . Pneumococcal Polysaccharide-23 11/05/2007  . Td 09/07/2003  . Tdap 08/17/2017  . Zoster 11/05/2010  . Zoster Recombinat (Shingrix) 08/17/2017, 06/28/2019, 09/05/2019    Past Medical History:  Diagnosis Date  . CHF (congestive heart failure) ([place]   . COPD (chronic obstructive pulmonary disease) ([place]   . Diabetes ([place]    type 2  . Diverticulitis   . Dyspnea    On Oxygen 3L via [place]   . GERD (gastroesophageal reflux disease)   . High cholesterol   . Lung cancer ([place]   . PE (pulmonary throm[place]embolism) ([place] 02/29/2020  . Pulmonary hypertension ([place]   . Uterine cancer ([place]     Tobacco History: Social History   Tobacco Use  Smoking Status Former Smoker  . Packs/day: 1.00   . Years: 58.00  . Pack years: 58.00  . Types: Cigarettes  . Quit date: 10/22/2015  . Years since quitting: 5.1  Smokeless Tobacco Never Used  Tobacco Comment   congratulated   Counseling given: Not Answered Comment: congratulated   Outpatient Medications Prior to Visit  Medication Sig Dispense Refill  . acetaminophen (TYLENOL) 500 MG tablet Take 1,000 mg by mouth every 6 (six) hours as needed for mild pain or fever.     .Marland Kitchenalbuterol (VENTOLIN HFA) 108 (90 Base) MCG/ACT inhaler Inhale 2 puffs into the lungs every 6 (six) hours as needed for wheezing or shortness of breath. 18 g 0  . Artificial Tear Ointment (DRY EYES OP) Apply 1 drop to eye every morning.    . calcium carbonate (OSCAL) 1500 (600 Ca) MG TABS tablet Take 1,200 mg by mouth daily.    . Cinnamon 500 MG TABS Take 1,000  mg by mouth daily.     Marland Kitchen dextromethorphan-guaiFENesin (ROBITUSSIN-DM) 10-100 MG/5ML liquid Take 10 mLs by mouth at bedtime.    Marland Kitchen ELIQUIS 5 MG TABS tablet TAKE 1 TABLET BY MOUTH TWICE A DAY (Patient taking differently: Take 5 mg by mouth 2 (two) times daily.) 60 tablet 2  . empagliflozin (JARDIANCE) 10 MG TABS tablet Take 1 tablet (10 mg total) by mouth daily before breakfast. 30 tablet 3  . furosemide (LASIX) 20 MG tablet TAKE 1 TABLET BY MOUTH EVERY DAY (Patient taking differently: Take 20 mg by mouth daily.) 90 tablet 2  . losartan (COZAAR) 25 MG tablet Take 0.5 tablets (12.5 mg total) by mouth daily. 90 tablet 3  . metFORMIN (GLUCOPHAGE) 1000 MG tablet TAKE 1 TABLET (1,000 MG TOTAL) BY MOUTH 2 (TWO) TIMES DAILY WITH A MEAL. 180 tablet 0  . Multiple Vitamins-Minerals (OCUVITE EYE HEALTH FORMULA PO) Take 1 capsule by mouth daily.    Marland Kitchen omeprazole (PRILOSEC) 40 MG capsule TAKE 1 CAPSULE BY MOUTH EVERY DAY (Patient taking differently: Take 40 mg by mouth daily.) 90 capsule 1  . pravastatin (PRAVACHOL) 10 MG tablet TAKE 1 TABLET BY MOUTH EVERY DAY (Patient taking differently: Take 10 mg by mouth daily.) 90 tablet 1   . umeclidinium-vilanterol (ANORO ELLIPTA) 62.5-25 MCG/INH AEPB Inhale 1 puff into the lungs daily. 60 each 5  . vitamin C (ASCORBIC ACID) 500 MG tablet Take 500 mg by mouth every other day.    . vitamin E 1000 UNIT capsule Take 1,000 Units by mouth every Monday, Wednesday, and Friday at 6 PM.     No facility-administered medications prior to visit.   Review of Systems  Review of Systems  Constitutional: Negative.   HENT: Positive for congestion.   Respiratory: Positive for shortness of breath. Negative for cough.   Cardiovascular: Negative.     Physical Exam  BP 120/64 (BP Location: Left Arm, Patient Position: Sitting, Cuff Size: Normal)   Pulse 86   Temp (!) 97.3 F (36.3 C) (Temporal)   Ht 5' (1.524 m)   Wt 142 lb 3.2 oz (64.5 kg)   SpO2 91%   BMI 27.77 kg/m  Physical Exam Constitutional:      Appearance: Normal appearance.     Comments: Well appearing  HENT:     Head: Normocephalic and atraumatic.     Mouth/Throat:     Mouth: Mucous membranes are moist.     Pharynx: Oropharynx is clear.  Cardiovascular:     Rate and Rhythm: Normal rate and regular rhythm.  Pulmonary:     Effort: Pulmonary effort is normal.     Breath sounds: Normal breath sounds. No wheezing, rhonchi or rales.     Comments: CTA, diminished. O2 91% RA Musculoskeletal:     Comments: In transport [place]  Skin:    General: Skin is warm and dry.  Neurological:     General: No focal deficit present.     Mental Status: She is alert and oriented to person, place, and time. Mental status is at baseline.  Psychiatric:        Mood and Affect: Mood normal.        Behavior: Behavior normal.        Thought Content: Thought content normal.        Judgment: Judgment normal.      Lab Results:  CBC    Component Value Date/Time   WBC 6.7 12/09/2020 1333   RBC 3.98 12/09/2020 1333   HGB 10.8 (  L) 12/09/2020 1333   HCT 35.8 (L) 12/09/2020 1333   PLT 325 12/09/2020 1333   MCV 89.9 12/09/2020 1333   MCH  27.1 12/09/2020 1333   MCHC 30.2 12/09/2020 1333   RDW 16.4 (H) 12/09/2020 1333   LYMPHSABS 1.9 12/09/2020 1333   MONOABS 0.5 12/09/2020 1333   EOSABS 0.2 12/09/2020 1333   BASOSABS 0.1 12/09/2020 1333    BMET    Component Value Date/Time   NA 139 12/10/2020 0339   NA 140 05/13/2020 1043   K 3.9 12/10/2020 0339   CL 104 12/10/2020 0339   CO2 27 12/10/2020 0339   GLUCOSE 136 (H) 12/10/2020 0339   BUN 19 12/10/2020 0339   BUN 11 05/13/2020 1043   CREATININE 0.98 12/10/2020 0339   CALCIUM 9.5 12/10/2020 0339   GFRNONAA 58 (L) 12/10/2020 0339   GFRAA 81 05/13/2020 1043    BNP    Component Value Date/Time   BNP 224.9 (H) 12/09/2020 1510    ProBNP No results found for: PROBNP  Imaging: DG Chest 1 View  Result Date: 12/09/2020 CLINICAL DATA:  Shortness of breath for 2 weeks. EXAM: CHEST  1 VIEW COMPARISON:  06/25/2020 FINDINGS: Mild cardiac enlargement. Advanced chronic interstitial changes of emphysema. Superimposed diffuse increase interstitial opacities are noted throughout both lungs which may reflect interstitial edema or atypical viral infection. Nodular opacities within the left upper lobe and lingula are again noted and appear similar to the previous exam. Postop change within the right lower lobe noted with several suture chains. The visualized osseous structures appear intact . IMPRESSION: 1. Diffuse increase interstitial markings are noted bilaterally compatible with either pulmonary edema or atypical infection. Correlate for any clinical signs or symptoms of CHF. 2. Unchanged nodular opacities within the left upper lobe and lingula. Electronically Signed   By: Kerby Moors M.D.   On: 12/09/2020 14:30   ECHOCARDIOGRAM COMPLETE  Result Date: 12/10/2020    ECHOCARDIOGRAM REPORT   Patient Name:   Brianna Adams Date of Exam: 12/10/2020 Medical Rec #:  [id number]     Height:       63.0 in Accession #:    [id number]    Weight:       139.1 lb Date of Birth:  1941/07/24      BSA:           1.657 m Patient Age:    18 years      BP:           116/59 mmHg Patient Gender: F             HR:           79 bpm. Exam Location:  Inpatient Procedure: 2D Echo, Cardiac Doppler and Color Doppler Indications:    CHF  History:        Patient has prior history of Echocardiogram examinations, most                 recent 04/23/2020. CHF, Pulmonary HTN and COPD,                 Signs/Symptoms:Dyspnea; Risk Factors:Diabetes.  Sonographer:    Luisa Hart RDCS Referring Phys: [place]  1. Left ventricular ejection fraction, by estimation, is 55 to 60%. The left ventricle has normal function. The left ventricle has no regional wall motion abnormalities. Left ventricular diastolic parameters are consistent with Grade I diastolic dysfunction (impaired relaxation). There is the interventricular septum is flattened in systole and diastole, consistent  with right ventricular pressure and volume overload.  2. Right ventricular systolic function is normal. The right ventricular size is moderately enlarged. There is severely elevated pulmonary artery systolic pressure. The estimated right ventricular systolic pressure is 17.4 mmHg.  3. Thickened mitral leaflet tips. The mitral valve is abnormal. Mild mitral valve regurgitation.  4. The aortic valve is tricuspid. Aortic valve regurgitation is not visualized. Mild aortic valve sclerosis is present, with no evidence of aortic valve stenosis. Comparison(s): Changes from prior study are noted. 04/23/2020: LVEF 50-55%, RVSP 54.9 mmHg. FINDINGS  Left Ventricle: Left ventricular ejection fraction, by estimation, is 55 to 60%. The left ventricle has normal function. The left ventricle has no regional wall motion abnormalities. The left ventricular internal cavity size was normal in size. There is  no left ventricular hypertrophy. The interventricular septum is flattened in systole and diastole, consistent with right ventricular pressure and volume overload. Left  ventricular diastolic parameters are consistent with Grade I diastolic dysfunction (impaired relaxation). Normal left ventricular filling pressure. Right Ventricle: The right ventricular size is moderately enlarged. No increase in right ventricular wall thickness. Right ventricular systolic function is normal. There is severely elevated pulmonary artery systolic pressure. The tricuspid regurgitant velocity is 4.38 m/s, and with an assumed right atrial pressure of 8 mmHg, the estimated right ventricular systolic pressure is 94.4 mmHg. Left Atrium: Left atrial size was normal in size. Right Atrium: Right atrial size was normal in size. Pericardium: There is no evidence of pericardial effusion. Mitral Valve: Thickened mitral leaflet tips. The mitral valve is abnormal. Mild mitral valve regurgitation. Tricuspid Valve: The tricuspid valve is grossly normal. Tricuspid valve regurgitation is trivial. Aortic Valve: The aortic valve is tricuspid. Aortic valve regurgitation is not visualized. Mild aortic valve sclerosis is present, with no evidence of aortic valve stenosis. Aortic valve mean gradient measures 3.0 mmHg. Aortic valve peak gradient measures 6.0 mmHg. Aortic valve area, by VTI measures 1.58 cm. Pulmonic Valve: The pulmonic valve was grossly normal. Pulmonic valve regurgitation is trivial. Aorta: The aortic root and ascending aorta are structurally normal, with no evidence of dilitation. IAS/Shunts: No atrial level shunt detected by color flow Doppler.  LEFT VENTRICLE PLAX 2D LVIDd:         4.10 cm     Diastology LVIDs:         3.00 cm     LV e' medial:    6.09 cm/s LV PW:         1.00 cm     LV E/e' medial:  7.9 LV IVS:        0.90 cm     LV e' lateral:   8.27 cm/s LVOT diam:     1.60 cm     LV E/e' lateral: 5.8 LV SV:         34 LV SV Index:   21 LVOT Area:     2.01 cm  LV Volumes (MOD) LV vol d, MOD A2C: 28.4 ml LV vol d, MOD A4C: 16.9 ml LV vol s, MOD A2C: 11.3 ml LV vol s, MOD A4C: 8.2 ml LV SV MOD A2C:      17.1 ml LV SV MOD A4C:     16.9 ml LV SV MOD BP:      13.8 ml RIGHT VENTRICLE RV S prime:     11.70 cm/s TAPSE (M-mode): 1.8 cm LEFT ATRIUM             Index       RIGHT  ATRIUM           Index LA diam:        3.20 cm 1.93 cm/m  RA Area:     10.90 cm LA Vol (A2C):   52.9 ml 31.92 ml/m RA Volume:   27.10 ml  16.35 ml/m LA Vol (A4C):   22.9 ml 13.82 ml/m LA Biplane Vol: 36.1 ml 21.78 ml/m  AORTIC VALVE                   PULMONIC VALVE AV Area (Vmax):    1.51 cm    PV Vmax:       0.95 m/s AV Area (Vmean):   1.52 cm    PV Vmean:      64.200 cm/s AV Area (VTI):     1.58 cm    PV VTI:        0.196 m AV Vmax:           122.00 cm/s PV Peak grad:  3.6 mmHg AV Vmean:          83.800 cm/s PV Mean grad:  2.0 mmHg AV VTI:            0.217 m AV Peak Grad:      6.0 mmHg AV Mean Grad:      3.0 mmHg LVOT Vmax:         91.70 cm/s LVOT Vmean:        63.200 cm/s LVOT VTI:          0.171 m LVOT/AV VTI ratio: 0.79  AORTA Ao Root diam: 2.50 cm Ao Asc diam:  3.00 cm MITRAL VALVE               TRICUSPID VALVE MV Area (PHT): 2.24 cm    TR Peak grad:   76.7 mmHg MV Decel Time: 338 msec    TR Vmax:        438.00 cm/s MV E velocity: 48.00 cm/s MV A velocity: 90.00 cm/s  SHUNTS MV E/A ratio:  0.53        Systemic VTI:  0.17 m                            Systemic Diam: 1.60 cm Lyman Bishop MD Electronically signed by Lyman Bishop MD Signature Date/Time: 12/10/2020/10:10:43 AM    Final      Assessment & Plan:   COPD with chronic bronchitis and emphysema ([place]) - Patient feels her breathing could be better. Associated chest congestion. Rarely requires SABA. CAT score 17. She has severe emphysema on imaging. Recommend trial Trelegy 100 one puff daily. Adding flutter valve three times a day to help loosen phlegm.   Chronic respiratory failure with hypoxia ([place]) - Patient wears 3L oxygen 24/7 while at home, she also has POC. Patient came to apt today without oxygen, O2 was 91% RA sitting. Encourage compliance with oxygen use, goal  SpO2 88-90%  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[place])  - CT imaging in October 2021 showed severe emphysema and progressive fibrosis lingular process. Needs repeat CT chest wo contrast 6-12 month to monitor    Martyn Ehrich, NP 12/31/2020

## 2020-12-31 ENCOUNTER — Other Ambulatory Visit: Payer: Self-pay

## 2020-12-31 ENCOUNTER — Ambulatory Visit (INDEPENDENT_AMBULATORY_CARE_PROVIDER_SITE_OTHER): Payer: Medicare Other | Admitting: Primary Care

## 2020-12-31 ENCOUNTER — Encounter: Payer: Self-pay | Admitting: Primary Care

## 2020-12-31 VITALS — BP 120/64 | HR 86 | Temp 97.3°F | Ht 60.0 in | Wt 142.2 lb

## 2020-12-31 DIAGNOSIS — J9611 Chronic respiratory failure with hypoxia: Secondary | ICD-10-CM

## 2020-12-31 DIAGNOSIS — J701 Chronic and other pulmonary manifestations due to radiation: Secondary | ICD-10-CM

## 2020-12-31 DIAGNOSIS — J449 Chronic obstructive pulmonary disease, unspecified: Secondary | ICD-10-CM | POA: Diagnosis not present

## 2020-12-31 MED ORDER — TRELEGY ELLIPTA 100-62.5-25 MCG/INH IN AEPB: 100.0000 ug | INHALATION_SPRAY | Freq: Every day | RESPIRATORY_TRACT | 0 refills | Status: DC

## 2020-12-31 NOTE — Assessment & Plan Note (Signed)
-  Patient wears 3L oxygen 24/7 while at home, she also has POC. Patient came to apt today without oxygen, O2 was 91% RA sitting. Encourage compliance with oxygen use, goal SpO2 88-90%

## 2020-12-31 NOTE — Patient Instructions (Addendum)
Exam was normal. Patient reports that her breathing could be better, would like to try step up inhaler to Trelegy. Adding flutter valve to help loosen chest congestion. Needs repeat CT chest to monitor radiation fibrosis and emphysema  Recommendations: - Stop Anoro - Start Trelegy 100- take one puff in morning (2 week sample provided) - Adding flutter valve- use three times a day to help loosen congestion - Continue mucinex 1-2 times daily for cough/congestion  Orders: - CT chest wo contrast re: monitor radiation fibrosis   Follow-up: - 6 months with Dr. [name]

## 2020-12-31 NOTE — Assessment & Plan Note (Addendum)
-  Patient feels her breathing could be better. Associated chest congestion. Rarely requires SABA. CAT score 17. She has severe emphysema on imaging. Recommend trial Trelegy 100 one puff daily. Adding flutter valve three times a day to help loosen phlegm.

## 2020-12-31 NOTE — Assessment & Plan Note (Addendum)
-  CT imaging in October 2021 showed severe emphysema and progressive fibrosis lingular process. Needs repeat CT chest wo contrast 6-12 month to monitor

## 2021-01-01 NOTE — Progress Notes (Signed)
Agree with the details of the visit as noted by Geraldo Pitter, NP.  Renold Don, MD [facility]

## 2021-01-08 ENCOUNTER — Encounter: Payer: Self-pay | Admitting: Cardiology

## 2021-01-08 ENCOUNTER — Other Ambulatory Visit: Payer: Self-pay

## 2021-01-08 ENCOUNTER — Ambulatory Visit (INDEPENDENT_AMBULATORY_CARE_PROVIDER_SITE_OTHER): Payer: Medicare Other | Admitting: Cardiology

## 2021-01-08 VITALS — BP 126/65 | HR 116 | Ht 61.0 in | Wt 145.0 lb

## 2021-01-08 DIAGNOSIS — E785 Hyperlipidemia, unspecified: Secondary | ICD-10-CM

## 2021-01-08 DIAGNOSIS — I10 Essential (primary) hypertension: Secondary | ICD-10-CM | POA: Diagnosis not present

## 2021-01-08 DIAGNOSIS — I5032 Chronic diastolic (congestive) heart failure: Secondary | ICD-10-CM

## 2021-01-08 DIAGNOSIS — I272 Pulmonary hypertension, unspecified: Secondary | ICD-10-CM

## 2021-01-08 NOTE — Progress Notes (Deleted)
Cardiology Office Note:    Date:  01/08/2021   ID:  Brianna Adams, DOB 26-Mar-1941, MRN [id number]  PCP:  Brianna Brine, [name]  Cardiologist:  None  Electrophysiologist:  None   Referring MD: Brianna Brine, [name]   No chief complaint on file. ***  History of Present Illness:    Brianna Adams is a 80 y.o. female with a hx of chronic diastolic heart failure, COPD, PE on Eliquis, lung cancer status post right lower lobe resection, pulmonary hypertension,T2DM who is referred by Brianna Brine, [name] for evaluation of heart failure.  She was admitted from 7/5 through 12/10/2020.  She presented with worsening shortness of breath.  Had to increase her home oxygen from 3 to 4 L.  Reports she had not been taking her Lasix regularly.  Chest x-ray showed pulmonary edema, BNP 24.  Echocardiogram 12/10/2020 showed LVEF 55 to 75%, grade 1 diastolic dysfunction, moderate RV enlargement, severe pulmonary hypertension (RVSP 85 mmHg).  She received IV diuresis with improvement in symptoms and was discharged on Lasix 20 mg daily.  Thought to have croup 3 pulmonary hypertension due to COPD, PE.  Follows with pulmonology.  Wt Readings from Last 3 Encounters:  01/08/21 145 lb (65.8 kg)  12/31/20 142 lb 3.2 oz (64.5 kg)  12/22/20 143 lb 12.8 oz (65.2 kg)     Past Medical History:  Diagnosis Date  . CHF (congestive heart failure) ([place])   . COPD (chronic obstructive pulmonary disease) ([place])   . Diabetes ([place])    type 2  . Diverticulitis   . Dyspnea    On Oxygen 3L via [employer]   . GERD (gastroesophageal reflux disease)   . High cholesterol   . Lung cancer ([place])   . PE (pulmonary thromboembolism) ([place]) 02/29/2020  . Pulmonary hypertension ([place])   . Uterine cancer [facility])     Past Surgical History:  Procedure Laterality Date  . ABDOMINAL HYSTERECTOMY    . cataract surgery  2012 and 2017  . COLONOSCOPY    . FIBEROPTIC BRONCHOSCOPY    . GANGLION CYST EXCISION Left 09/11/2020   Procedure: EXCISION VOLAR RADIAL GANGLION OF  LEFT WRIST;  Surgeon: Brianna Brod, MD;  Location: [place];  Service: Orthopedics;  Laterality: Left;  AXILLARY BLOCK  . resection of lung cancer  2018  . right lower lobe non-anatomocal lung resection wedge    . right thorascoscopy      Current Medications: No outpatient medications have been marked as taking for the 01/08/21 encounter (Appointment) with Donato Heinz, MD.     Allergies:   Ace inhibitors, Lisinopril, Penicillin g, Atorvastatin, Cortisone, Hydrocortisone, Meloxicam, and Pravastatin   Social History   Socioeconomic History  . Marital status: Widowed    Spouse name: Not on file  . Number of children: 1  . Years of education: Not on file  . Highest education level: Master's degree (e.g., MA, MS, MEng, MEd, MSW, MBA)  Occupational History  . Occupation: retired    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r-sr Pharmacist, hospital  Tobacco Use  . Smoking status: Former Smoker    Packs/day: 1.00    Years: 58.00    Pack years: 58.00    Types: Cigarettes    Quit date: 10/22/2015    Years since quitting: 5.2  . Smokeless tobacco: Never Used  . Tobacco comment: congratulated  Vaping Use  . Vaping Use: Never used  Substance and Sexual Activity  . Alcohol use: Never  . Drug use: Never  . Sexual activity: Not Currently  Birth control/protection: Surgical    Comment: HYSTERECTOMY  Other Topics Concern  . Not on file  Social History Narrative  . Not on file   Social Determinants of Health   Financial Resource Strain: Low Risk   . Difficulty of Paying Living Expenses: Not hard at all  Food Insecurity: No Food Insecurity  . Worried About Charity fundraiser in the Last Year: Never true  . Ran Out of Food in the Last Year: Never true  Transportation Needs: No Transportation Needs  . Lack of Transportation (Medical): No  . Lack of Transportation (Non-Medical): No  Physical Activity: Insufficiently Active  . Days of Exercise per Week: 5 days  . Minutes of Exercise per Session: 20 min   Stress: No Stress Concern Present  . Feeling of Stress : Not at all  Social Connections: Not on file     Family History: The patient's ***family history includes Cancer in her maternal aunt; Heart attack in her father; Hypertension in her mother.  ROS:   Please see the history of present illness.    *** All other systems reviewed and are negative.  EKGs/Labs/Other Studies Reviewed:    The following studies were reviewed today: ***  EKG:  EKG is *** ordered today.  The ekg ordered today demonstrates ***  Recent Labs: 05/13/2020: ALT 12 12/09/2020: B Natriuretic Peptide 224.9; Hemoglobin 10.8; Platelets 325 12/10/2020: BUN 19; Creatinine, Ser 0.98; Potassium 3.9; Sodium 139  Recent Lipid Panel    Component Value Date/Time   CHOL 168 05/13/2020 1043   TRIG 67 05/13/2020 1043   HDL 61 05/13/2020 1043   CHOLHDL 2.8 05/13/2020 1043   LDLCALC 94 05/13/2020 1043    Physical Exam:    VS:  There were no vitals taken for this visit.    Wt Readings from Last 3 Encounters:  12/31/20 142 lb 3.2 oz (64.5 kg)  12/22/20 143 lb 12.8 oz (65.2 kg)  12/19/20 143 lb 9.6 oz (65.1 kg)     GEN: *** Well nourished, well developed in no acute distress HEENT: Normal NECK: No JVD; No carotid bruits LYMPHATICS: No lymphadenopathy CARDIAC: ***RRR, no murmurs, rubs, gallops RESPIRATORY:  Clear to auscultation without rales, wheezing or rhonchi  ABDOMEN: Soft, non-tender, non-distended MUSCULOSKELETAL:  No edema; No deformity  SKIN: Warm and dry NEUROLOGIC:  Alert and oriented x 3 PSYCHIATRIC:  Normal affect   ASSESSMENT:    No diagnosis found. PLAN:    Chronic diastolic heart failure: EF 12/2020 with normal EF, moderately enlarged RV, normal RV function, severe pulmonary hypertension -Continue Lasix 20 mg daily -Recently started on Jardiance 10 mg daily  Pulmonary hypertension: RVSP 85 mmHg on echo 12/2020.  Likely combination of group 2 and group 3.  Also could potentially be group 4 given  history of PE.  Follows with pulmonology  Hypertension: On losartan 12.5 mg daily  Pravastatin: On 10 mg daily  RTC in***   Medication Adjustments/Labs and Tests Ordered: Current medicines are reviewed at length with the patient today.  Concerns regarding medicines are outlined above.  No orders of the defined types were placed in this encounter.  No orders of the defined types were placed in this encounter.   There are no Patient Instructions on file for this visit.   Signed, Donato Heinz, MD  01/08/2021 6:28 AM    [facility]

## 2021-01-08 NOTE — Progress Notes (Signed)
Cardiology Office Note:    Date:  01/09/2021   ID:  Brianna Adams, DOB 1941/08/10, MRN [id number]  PCP:  [name], [place]  Cardiologist:  None  Electrophysiologist:  None   Referring MD: [name], [name]   Chief Complaint  Patient presents with  . Congestive Heart Failure    History of Present Illness:    Brianna Adams is a 80 y.o. female with a hx of chronic diastolic heart failure, COPD, PE on Eliquis, T2DM who is referred by [name], [name] for evaluation of heart failure.  She was admitted from 57/5 through 12/10/2020.  She presented with worsening shortness of breath.  Had to increase her home oxygen from 3 to 4 L.  Reports she had not been taking her Lasix regularly.  Chest x-ray showed pulmonary edema, BNP 24.  Echocardiogram 12/10/2020 showed LVEF 55 to 86%, grade 1 diastolic dysfunction, moderate RV enlargement, severe pulmonary hypertension (RVSP 85 mmHg).  She received IV diuresis with improvement in symptoms and was discharged on Lasix 20 mg daily.  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  Follows with pulmonology.  Today, she is accompanied by her daughter, who also provides some history. she is feeling well overall. Since starting Jardiance and Losartan, she reports her breathing is improving. She is on 3-4 liters of oxygen, and checks it at home occasionally. She denies any chest pains, lightheadedness, syncope, or LE edema. She does not check her weight at home as she does not own a scale. She does not use a walker or wheelchair at home, but is able to walk around her home without difficulty. She has an exercise bike at home and regularly visits the [facility] for exercise. Since her hospital visit, she is compliant with her Lasix.   Past Medical History:  Diagnosis Date  . CHF (congestive heart failure) ([place])   . COPD (chronic obstructive pulmonary disease) ([place])   . Diabetes ([place])    type 2  . Diverticulitis   . Dyspnea    On Oxygen 3L via [place]   . GERD  (gastroesophageal reflux disease)   . High cholesterol   . Lung cancer ([place])   . PE (pulmonary thromboembolism) ([place]) 02/29/2020  . Pulmonary hypertension ([place])   . Uterine cancer [facility])     Past Surgical History:  Procedure Laterality Date  . ABDOMINAL HYSTERECTOMY    . cataract surgery  2012 and 2017  . COLONOSCOPY    . FIBEROPTIC BRONCHOSCOPY    . GANGLION CYST EXCISION Left 09/11/2020   Procedure: EXCISION VOLAR RADIAL GANGLION OF LEFT WRIST;  Surgeon: Daryll Brod, MD;  Location: [place];  Service: Orthopedics;  Laterality: Left;  AXILLARY BLOCK  . resection of lung cancer  2018  . right lower lobe non-anatomocal lung resection wedge    . right thorascoscopy      Current Medications: Current Meds  Medication Sig  . acetaminophen (TYLENOL) 500 MG tablet Take 1,000 mg by mouth every 6 (six) hours as needed for mild pain or fever.   Marland Kitchen albuterol (VENTOLIN HFA) 108 (90 Base) MCG/ACT inhaler Inhale 2 puffs into the lungs every 6 (six) hours as needed for wheezing or shortness of breath.  . Artificial Tear Ointment (DRY EYES OP) Apply 1 drop to eye every morning.  . calcium carbonate (OSCAL) 1500 (600 Ca) MG TABS tablet Take 1,200 mg by mouth daily.  . Cinnamon 500 MG TABS Take 1,000 mg by mouth daily.   Marland Kitchen dextromethorphan-guaiFENesin (ROBITUSSIN-DM) 10-100  MG/5ML liquid Take 10 mLs by mouth at bedtime.  Marland Kitchen ELIQUIS 5 MG TABS tablet TAKE 1 TABLET BY MOUTH TWICE A DAY (Patient taking differently: Take 5 mg by mouth 2 (two) times daily.)  . empagliflozin (JARDIANCE) 10 MG TABS tablet Take 1 tablet (10 mg total) by mouth daily before breakfast.  . Fluticasone-Umeclidin-Vilant (TRELEGY ELLIPTA) 100-62.5-25 MCG/INH AEPB Inhale 100 mcg into the lungs daily.  . furosemide (LASIX) 20 MG tablet TAKE 1 TABLET BY MOUTH EVERY DAY (Patient taking differently: Take 20 mg by mouth daily.)  . losartan (COZAAR) 25 MG tablet Take 0.5 tablets (12.5 mg total) by mouth daily.  . metFORMIN (GLUCOPHAGE) 1000 MG  tablet TAKE 1 TABLET (1,000 MG TOTAL) BY MOUTH 2 (TWO) TIMES DAILY WITH A MEAL.  . Multiple Vitamins-Minerals (OCUVITE EYE HEALTH FORMULA PO) Take 1 capsule by mouth daily.  Marland Kitchen omeprazole (PRILOSEC) 40 MG capsule TAKE 1 CAPSULE BY MOUTH EVERY DAY (Patient taking differently: Take 40 mg by mouth daily.)  . pravastatin (PRAVACHOL) 10 MG tablet TAKE 1 TABLET BY MOUTH EVERY DAY (Patient taking differently: Take 10 mg by mouth daily.)  . vitamin C (ASCORBIC ACID) 500 MG tablet Take 500 mg by mouth every other day.  . vitamin E 1000 UNIT capsule Take 1,000 Units by mouth every Monday, Wednesday, and Friday at 6 PM.  . [DISCONTINUED] umeclidinium-vilanterol (ANORO ELLIPTA) 62.5-25 MCG/INH AEPB Inhale 1 puff into the lungs daily.     Allergies:   Ace inhibitors, Lisinopril, Penicillin g, Atorvastatin, Cortisone, Hydrocortisone, Meloxicam, and Pravastatin   Social History   Socioeconomic History  . Marital status: Widowed    Spouse name: Not on file  . Number of children: 1  . Years of education: Not on file  . Highest education level: Master's degree (e.g., MA, MS, MEng, MEd, MSW, MBA)  Occupational History  . Occupation: retired    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r-sr Pharmacist, hospital  Tobacco Use  . Smoking status: Former Smoker    Packs/day: 1.00    Years: 58.00    Pack years: 58.00    Types: Cigarettes    Quit date: 10/22/2015    Years since quitting: 5.2  . Smokeless tobacco: Never Used  . Tobacco comment: congratulated  Vaping Use  . Vaping Use: Never used  Substance and Sexual Activity  . Alcohol use: Never  . Drug use: Never  . Sexual activity: Not Currently    Birth control/protection: Surgical    Comment: HYSTERECTOMY  Other Topics Concern  . Not on file  Social History Narrative  . Not on file   Social Determinants of Health   Financial Resource Strain: Low Risk   . Difficulty of Paying Living Expenses: Not hard at all  Food Insecurity: No Food Insecurity  . Worried About Sales executive in the Last Year: Never true  . Ran Out of Food in the Last Year: Never true  Transportation Needs: No Transportation Needs  . Lack of Transportation (Medical): No  . Lack of Transportation (Non-Medical): No  Physical Activity: Insufficiently Active  . Days of Exercise per Week: 5 days  . Minutes of Exercise per Session: 20 min  Stress: No Stress Concern Present  . Feeling of Stress : Not at all  Social Connections: Not on file     Family History: The patient's family history includes Cancer in her maternal aunt; Heart attack in her father; Hypertension in her mother.  ROS:   Please see the history of present illness.    (+)  Shortness of breath (+) Right shin pain All other systems reviewed and are negative.  EKGs/Labs/Other Studies Reviewed:    The following studies were reviewed today:   EKG:   01/08/21: Sinus rhythm. Rate 75 bpm. No ST abnormalities, Left atrial enlargement  Recent Labs: 05/13/2020: ALT 12 12/09/2020: B Natriuretic Peptide 224.9; Hemoglobin 10.8; Platelets 325 12/10/2020: BUN 19; Creatinine, Ser 0.98; Potassium 3.9; Sodium 139  Recent Lipid Panel    Component Value Date/Time   CHOL 168 05/13/2020 1043   TRIG 67 05/13/2020 1043   HDL 61 05/13/2020 1043   CHOLHDL 2.8 05/13/2020 1043   LDLCALC 94 05/13/2020 1043    Physical Exam:    VS:  BP 126/65   Pulse (!) 116   Ht _0  (1.549 m)   Wt 145 lb (65.8 kg)   SpO2 (!) 80%   BMI 27.40 kg/m     Wt Readings from Last 3 Encounters:  01/08/21 145 lb (65.8 kg)  12/31/20 142 lb 3.2 oz (64.5 kg)  12/22/20 143 lb 12.8 oz (65.2 kg)     GEN: Well nourished, well developed in no acute distress HEENT: Normal NECK: No JVD; No carotid bruits LYMPHATICS: No lymphadenopathy CARDIAC: RRR, no murmurs, rubs, gallops RESPIRATORY:  Clear to auscultation without rales, wheezing or rhonchi  ABDOMEN: Soft, non-tender, non-distended MUSCULOSKELETAL:  No edema; No deformity  SKIN: Warm and dry NEUROLOGIC:  Alert  and oriented x 3 PSYCHIATRIC:  Normal affect   ASSESSMENT:    1. Chronic diastolic CHF (congestive heart failure) ([place])   2. Pulmonary hypertension, unspecified ([place])   3. Essential hypertension   4. Hyperlipidemia, unspecified hyperlipidemia type    PLAN:    Chronic diastolic heart failure: EF 12/2020 with normal EF, moderately enlarged RV, normal RV function, severe pulmonary hypertension -Continue Lasix 20 mg daily.  Will check BMP, magnesium -Recently started on Jardiance 10 mg daily  Pulmonary hypertension: RVSP 85 mmHg on echo 12/2020.  Likely combination of group 2 and group 3.  Also could potentially be group 4 given history of PE.  Follows with pulmonology  Hypertension: On losartan 12.5 mg daily.  Appears controlled  Pravastatin: On 10 mg daily  RTC in 3 months.   Medication Adjustments/Labs and Tests Ordered: Current medicines are reviewed at length with the patient today.  Concerns regarding medicines are outlined above.  Orders Placed This Encounter  Procedures  . Basic metabolic panel  . Magnesium   No orders of the defined types were placed in this encounter.   Patient Instructions  Medication Instructions:  Your physician recommends that you continue on your current medications as directed. Please refer to the Current Medication list given to you today.  *If you need a refill on your cardiac medications before your next appointment, please call your pharmacy*   Lab Work: BMET, [place] today  If you have labs (blood work) drawn today and your tests are completely normal, you will receive your results only by: Marland Kitchen MyChart Message (if you have MyChart) OR . A paper copy in the mail If you have any lab test that is abnormal or we need to change your treatment, we will call you to review the results.   Follow-Up: At [facility], you and your health needs are our priority.  As part of our continuing mission to provide you with exceptional heart care, we have  created designated Provider Care Teams.  These Care Teams include your primary Cardiologist (physician) and Advanced Practice Providers (APPs -  Physician Assistants and Nurse Practitioners) who all work together to provide you with the care you need, when you need it.  We recommend signing up for the patient portal called "MyChart".  Sign up information is provided on this After Visit Summary.  MyChart is used to connect with patients for Virtual Visits (Telemedicine).  Patients are able to view lab/test results, encounter notes, upcoming appointments, etc.  Non-urgent messages can be sent to your provider as well.   To learn more about what you can do with MyChart, go to [email or web address].    Your next appointment:   3 month(s)  The format for your next appointment:   In Person  Provider:   Oswaldo Milian, MD       [facility],acting as a scribe for Donato Heinz, MD.,have documented all relevant documentation on the behalf of Donato Heinz, MD,as directed by  Donato Heinz, MD while in the presence of Donato Heinz, MD.  I, Donato Heinz, MD, have reviewed all documentation for this visit. The documentation on 01/09/21 for the exam, diagnosis, procedures, and orders are all accurate and complete.   Signed, Donato Heinz, MD  01/09/2021 9:46 AM    [facility]

## 2021-01-08 NOTE — Patient Instructions (Signed)
Medication Instructions:  Your physician recommends that you continue on your current medications as directed. Please refer to the Current Medication list given to you today.  *If you need a refill on your cardiac medications before your next appointment, please call your pharmacy*   Lab Work: BMET, [place] today  If you have labs (blood work) drawn today and your tests are completely normal, you will receive your results only by: Marland Kitchen MyChart Message (if you have MyChart) OR . A paper copy in the mail If you have any lab test that is abnormal or we need to change your treatment, we will call you to review the results.   Follow-Up: At [facility], you and your health needs are our priority.  As part of our continuing mission to provide you with exceptional heart care, we have created designated Provider Care Teams.  These Care Teams include your primary Cardiologist (physician) and Advanced Practice Providers (APPs -  Physician Assistants and Nurse Practitioners) who all work together to provide you with the care you need, when you need it.  We recommend signing up for the patient portal called "MyChart".  Sign up information is provided on this After Visit Summary.  MyChart is used to connect with patients for Virtual Visits (Telemedicine).  Patients are able to view lab/test results, encounter notes, upcoming appointments, etc.  Non-urgent messages can be sent to your provider as well.   To learn more about what you can do with MyChart, go to [email or web address].    Your next appointment:   3 month(s)  The format for your next appointment:   In Person  Provider:   Oswaldo Milian, MD

## 2021-01-13 ENCOUNTER — Other Ambulatory Visit: Payer: Self-pay | Admitting: Nurse Practitioner

## 2021-01-14 NOTE — Addendum Note (Signed)
Addended by: Lubertha Sayres on: 01/14/2021 07:18 AM   Modules accepted: Orders

## 2021-01-16 ENCOUNTER — Other Ambulatory Visit: Payer: Self-pay

## 2021-01-16 ENCOUNTER — Ambulatory Visit
Admission: RE | Admit: 2021-01-16 | Discharge: 2021-01-16 | Disposition: A | Discharge status: Home / Self Care | Payer: Medicare Other | Source: Ambulatory Visit | Attending: Primary Care | Admitting: Primary Care

## 2021-01-16 DIAGNOSIS — J701 Chronic and other pulmonary manifestations due to radiation: Secondary | ICD-10-CM | POA: Insufficient documentation

## 2021-02-03 NOTE — Progress Notes (Signed)
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 No new or enlarging pulmonary nodules.   Incidental findings atherosclerosis of aorta, great vessels and coronary arteries. Enlargement of pulmonary artery. Heterogeneous enlargement of the thyroid gland. I do not see that she has ever had thyroid ultrasound, she should follow up with her PCP regarding this and they may want to order test. May also want to order echocardiogram if not done in the last 2 years.   CC: Brianna Adams

## 2021-02-06 ENCOUNTER — Telehealth: Payer: Self-pay | Admitting: Primary Care

## 2021-02-06 NOTE — Telephone Encounter (Signed)
Called and spoke with Benita,Patient's daughter (DPR).  CT results and recommendations from [place], NP given.  Understanding stated.  Nothing further at this time.

## 2021-02-09 ENCOUNTER — Encounter: Payer: Medicare Other | Admitting: Nurse Practitioner

## 2021-02-12 ENCOUNTER — Other Ambulatory Visit: Payer: Self-pay | Admitting: Nurse Practitioner

## 2021-02-12 ENCOUNTER — Telehealth: Payer: Self-pay | Admitting: Primary Care

## 2021-02-12 DIAGNOSIS — E119 Type 2 diabetes mellitus without complications: Secondary | ICD-10-CM

## 2021-02-12 MED ORDER — TRELEGY ELLIPTA 100-62.5-25 MCG/INH IN AEPB
100.0000 ug | INHALATION_SPRAY | Freq: Every day | RESPIRATORY_TRACT | 5 refills | Status: DC
Start: 2021-02-12 — End: 2021-02-20

## 2021-02-12 NOTE — Telephone Encounter (Signed)
Lm for patient's daughter, Benita(DPR)

## 2021-02-12 NOTE — Telephone Encounter (Signed)
Rx for Trelegy has been sent to preferred pharmacy.  Left detailed message for patient's daughter, Benita(DPR).   Nothing further needed at this time.

## 2021-02-15 ENCOUNTER — Other Ambulatory Visit: Payer: Self-pay | Admitting: Nurse Practitioner

## 2021-02-20 MED ORDER — TRELEGY ELLIPTA 100-62.5-25 MCG/INH IN AEPB: 100.0000 ug | INHALATION_SPRAY | Freq: Every day | RESPIRATORY_TRACT | 5 refills | Status: DC

## 2021-02-20 NOTE — Telephone Encounter (Signed)
Spoke to patient's daughter, Benita(DPR). Benita stated that [facility] did not receive trelegy Rx. Rx has been resent.  Nothing further needed at this time.

## 2021-02-20 NOTE — Addendum Note (Signed)
Addended by: Claudette Head A on: 02/20/2021 11:06 AM   Modules accepted: Orders

## 2021-02-26 LAB — HM MAMMOGRAPHY

## 2021-03-03 ENCOUNTER — Ambulatory Visit (INDEPENDENT_AMBULATORY_CARE_PROVIDER_SITE_OTHER): Payer: Medicare Other | Admitting: Nurse Practitioner

## 2021-03-03 ENCOUNTER — Encounter: Payer: Self-pay | Admitting: Nurse Practitioner

## 2021-03-03 ENCOUNTER — Other Ambulatory Visit: Payer: Self-pay

## 2021-03-03 VITALS — BP 132/70 | HR 79 | Temp 97.9°F | Ht 61.8 in | Wt 139.8 lb

## 2021-03-03 DIAGNOSIS — R928 Other abnormal and inconclusive findings on diagnostic imaging of breast: Secondary | ICD-10-CM | POA: Diagnosis not present

## 2021-03-03 DIAGNOSIS — Z6825 Body mass index (BMI) 25.0-25.9, adult: Secondary | ICD-10-CM

## 2021-03-03 DIAGNOSIS — R634 Abnormal weight loss: Secondary | ICD-10-CM

## 2021-03-03 DIAGNOSIS — J449 Chronic obstructive pulmonary disease, unspecified: Secondary | ICD-10-CM

## 2021-03-03 DIAGNOSIS — E7800 Pure hypercholesterolemia, unspecified: Secondary | ICD-10-CM

## 2021-03-03 DIAGNOSIS — R0789 Other chest pain: Secondary | ICD-10-CM

## 2021-03-03 DIAGNOSIS — E119 Type 2 diabetes mellitus without complications: Secondary | ICD-10-CM | POA: Diagnosis not present

## 2021-03-03 NOTE — Progress Notes (Signed)
I,Brianna Adams as a Education administrator for [employer], [name].,have documented all relevant documentation on the behalf of Brianna Brine, [name],as directed by  Brianna Brine, [name] while in the presence of Brianna Adams, [place].  This visit occurred during the SARS-CoV-2 public health emergency.  Safety protocols were in place, including screening questions prior to the visit, additional usage of staff PPE, and extensive cleaning of exam room while observing appropriate contact time as indicated for disinfecting solutions.  Subjective:     Patient ID: Brianna Adams , female    DOB: Jul 27, 1941 , 80 y.o.   MRN: [id number]   Chief Complaint  Patient presents with   Diabetes   pain under her breast    Patient has pain in her left breast that she started feeling when she got up this morning. The pain is a constant aching pain and radiates to mid chest.    Weight Loss    Patient is concerned about her losing weight.     HPI  Patient presents today for a diabetes f/u. Daughter also has some concerns about her losing weight. Brianna Adams also complains of having pain under her left breast that radiates to midcheck. The pain is a constant ache that started this morning when she got up. She is describing as constant aching. 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 Gardiner Rhyme to be done here BMP and magnesium.  dens  Wt Readings from Last 3 Encounters: 03/03/21 : 139 lb 12.8 oz (63.4 kg) 01/08/21 : 145 lb (65.8 kg) 12/31/20 : 142 lb 3.2 oz (64.5 kg)  She is concerned about weight loss since 4/15 - she is down about 4 - 5 lbs. She is at 4 l/m. She has been going to [place] 2 days a week since 4/15.     Diabetes She presents for her follow-up diabetic visit. She has type 2 diabetes mellitus. There are no hypoglycemic associated symptoms. Pertinent negatives for hypoglycemia include no dizziness or headaches. There are no diabetic associated symptoms.  Pertinent negatives for diabetes include no chest pain, no fatigue, no polydipsia, no polyphagia and no polyuria. There are no diabetic complications. Risk factors for coronary artery disease include sedentary lifestyle. Current diabetic treatment includes oral agent (monotherapy). She has not had a previous visit with a dietitian. (Does not check her blood sugars regularly ) Eye exam is not current.    Past Medical History:  Diagnosis Date   CHF (congestive heart failure) (HCC)    COPD (chronic obstructive pulmonary disease) (HCC)    Diabetes (HCC)    type 2   Diverticulitis    Dyspnea    On Oxygen 3L via [place]    GERD (gastroesophageal reflux disease)    High cholesterol    Lung cancer (HCC)    PE (pulmonary thromboembolism) ([place]) 02/29/2020   Pulmonary hypertension (HCC)    Uterine cancer ([place])      Family History  Problem Relation Age of Onset   Hypertension Mother    Heart attack Father    Cancer Maternal Aunt     No current facility-administered medications for this visit.  Current Outpatient Medications:    furosemide (LASIX) 20 MG tablet, TAKE 1 TABLET BY MOUTH EVERY DAY, Disp: 90 tablet, Rfl: 2   pravastatin (PRAVACHOL) 10 MG tablet, TAKE 1 TABLET BY MOUTH EVERY DAY, Disp: 90 tablet, Rfl: 1  Facility-Administered Medications Ordered in Other Visits:    acetaminophen (  TYLENOL) tablet 650 mg, 650 mg, Oral, Q6H PRN, Ivor Costa, MD   albuterol (PROVENTIL) (2.5 MG/3ML) 0.083% nebulizer solution 2.5 mg, 2.5 mg, Nebulization, Q4H PRN, Ivor Costa, MD   apixaban [facility] [place]) tablet 5 mg, 5 mg, Oral, BID, Ivor Costa, MD, 5 mg at 03/10/21 2101   [START ON 03/11/2021] ascorbic acid (VITAMIN C) tablet 500 mg, 500 mg, Oral, Henreitta Cea, MD   azithromycin [facility]) tablet 500 mg, 500 mg, Oral, Daily, Fritzi Mandes, MD, 500 mg at 03/10/21 1212   cefTRIAXone (ROCEPHIN) 1 g in sodium chloride 0.9 % 100 mL IVPB, 1 g, Intravenous, Q24H, Fritzi Mandes, MD, Last Rate: 200 mL/hr at 03/10/21 2114,  1 g at 03/10/21 2114   dextromethorphan-guaiFENesin ([place] DM) 30-600 MG per 12 hr tablet 1 tablet, 1 tablet, Oral, BID PRN, Ivor Costa, MD   furosemide (LASIX) tablet 20 mg, 20 mg, Oral, Daily, Ivor Costa, MD, 20 mg at 03/10/21 4128   hydrALAZINE (APRESOLINE) injection 5 mg, 5 mg, Intravenous, Q2H PRN, Ivor Costa, MD   insulin aspart (novoLOG) injection 0-5 Units, 0-5 Units, Subcutaneous, QHS, Ivor Costa, MD   insulin aspart (novoLOG) injection 0-9 Units, 0-9 Units, Subcutaneous, TID WC, Ivor Costa, MD, 1 Units at 03/10/21 1213   ipratropium-albuterol (DUONEB) 0.5-2.5 (3) MG/3ML nebulizer solution 3 mL, 3 mL, Nebulization, TID, Ivor Costa, MD, 3 mL at 03/10/21 2030   multivitamin with minerals tablet 1 tablet, 1 tablet, Oral, Daily, Ivor Costa, MD, 1 tablet at 03/10/21 7867   multivitamin-lutein (OCUVITE-LUTEIN) capsule, , Oral, Daily, Ivor Costa, MD, 1 capsule at 03/10/21 0858   ondansetron (ZOFRAN) injection 4 mg, 4 mg, Intravenous, Q8H PRN, Ivor Costa, MD   pantoprazole (PROTONIX) EC tablet 40 mg, 40 mg, Oral, Daily, Ivor Costa, MD, 40 mg at 03/10/21 6720   pravastatin (PRAVACHOL) tablet 10 mg, 10 mg, Oral, QHS, Ivor Costa, MD, 10 mg at 03/10/21 2101   [START ON 03/11/2021] vitamin E capsule 1,000 Units, 1,000 Units, Oral, Q M,W,F, Ivor Costa, MD   Allergies  Allergen Reactions   Ace Inhibitors Swelling   Lisinopril Swelling   Penicillin G Hives, Shortness Of Breath and Rash   Atorvastatin Other (See Comments)    Myalgias (Muscle Pain)    Cortisone Other (See Comments)    Unknown reaction   Hydrocortisone Other (See Comments)    Unknown reaction   Meloxicam Other (See Comments)    Bloody stools   Pravastatin Other (See Comments)    Chest Pain if high dosage      Review of Systems  Constitutional: Negative.  Negative for fatigue.  Respiratory: Negative.  Negative for wheezing.   Cardiovascular: Negative.  Negative for chest pain, palpitations and leg swelling.  Endocrine:  Negative for polydipsia, polyphagia and polyuria.  Musculoskeletal: Negative.   Skin: Negative.   Neurological:  Negative for dizziness and headaches.  Psychiatric/Behavioral: Negative.      Today's Vitals   03/03/21 1028  BP: 132/70  Pulse: 79  Temp: 97.9 F (36.6 C)  SpO2: 97%  Weight: 139 lb 12.8 oz (63.4 kg)  Height: 5' 1.8" (1.57 m)   Body mass index is 25.74 kg/m.   Objective:  Physical Exam Vitals reviewed.  Constitutional:      General: She is not in acute distress.    Appearance: Normal appearance.  Cardiovascular:     Rate and Rhythm: Normal rate and regular rhythm.     Pulses: Normal pulses.     Heart sounds: Normal heart sounds. No murmur  heard. Pulmonary:     Effort: Pulmonary effort is normal. No respiratory distress.     Breath sounds: Normal breath sounds. No wheezing.     Comments: She is wearing oxygen at 4 l/m Chest:     Chest wall: No tenderness.  Neurological:     General: No focal deficit present.     Mental Status: She is alert and oriented to person, place, and time.     Cranial Nerves: No cranial nerve deficit.  Psychiatric:        Mood and Affect: Mood normal.        Behavior: Behavior normal.        Thought Content: Thought content normal.        Judgment: Judgment normal.        Assessment And Plan:     1. Type 2 diabetes mellitus without complication, without long-term current use of insulin (HCC) Chronic, fair control Continue with current medications Encouraged to limit intake of sugary foods and drinks - CMP14+EGFR - Hemoglobin A1c - Lipid panel  2. BMI 25.0-25.9,adult  3. Abnormal mammogram Her mammogram showed cyst and her daughter would like another look to see what the next steps would be - Ambulatory referral to Obstetrics / Gynecology  4. Chronic obstructive pulmonary disease, unspecified COPD type ([place]) Continue with oxygen therapy  5. Elevated cholesterol Chronic, controlled Continue with current medications,  tolerating well  6. Other chest pain No abnormal findings on physical exam EKG done with HR 68 Advised patient and daughter if worse to call office or go to ER for further evaluation - EKG 12-Lead - Magnesium  7. Weight loss  She has had a 6 lb weight loss in the last month. I have also discussed how COPD affects her weight with the amount of work it takes to breath. She has had a hospitalization since her last visit for COPD exacerbation   Patient was given opportunity to ask questions. Patient verbalized understanding of the plan and was able to repeat key elements of the plan. All questions were answered to their satisfaction.  Brianna Brine, [name]   I, Brianna Brine, [name], have reviewed all documentation for this visit. The documentation on 03/03/21 for the exam, diagnosis, procedures, and orders are all accurate and complete.   IF YOU HAVE BEEN REFERRED TO A SPECIALIST, IT MAY TAKE 1-2 WEEKS TO SCHEDULE/PROCESS THE REFERRAL. IF YOU HAVE NOT HEARD FROM US/SPECIALIST IN TWO WEEKS, PLEASE GIVE [place] A CALL AT [phone].   THE PATIENT IS ENCOURAGED TO PRACTICE SOCIAL DISTANCING DUE TO THE COVID-19 PANDEMIC.

## 2021-03-04 LAB — LIPID PANEL
Chol/HDL Ratio: 2.6 ratio (ref 0.0–4.4)
Cholesterol, Total: 189 mg/dL (ref 100–199)
HDL: 74 mg/dL (ref >39)
LDL Chol Calc (NIH): 102 mg/dL — ABNORMAL HIGH (ref 0–99)
Triglycerides: 71 mg/dL (ref 0–149)
VLDL Cholesterol Cal: 13 mg/dL (ref 5–40)

## 2021-03-04 LAB — CMP14+EGFR
ALT: 12 IU/L (ref 0–32)
AST: 20 IU/L (ref 0–40)
Albumin/Globulin Ratio: 1.6 (ref 1.2–2.2)
Albumin: 4.9 g/dL — ABNORMAL HIGH (ref 3.6–4.6)
Alkaline Phosphatase: 72 IU/L (ref 44–121)
BUN/Creatinine Ratio: 19 (ref 12–28)
BUN: 17 mg/dL (ref 8–27)
Bilirubin Total: 0.3 mg/dL (ref 0.0–1.2)
CO2: 20 mmol/L (ref 20–29)
Calcium: 10.2 mg/dL (ref 8.7–10.3)
Chloride: 98 mmol/L (ref 96–106)
Creatinine, Ser: 0.88 mg/dL (ref 0.57–1.00)
Globulin, Total: 3.1 g/dL (ref 1.5–4.5)
Glucose: 118 mg/dL — ABNORMAL HIGH (ref 65–99)
Potassium: 4.7 mmol/L (ref 3.5–5.2)
Sodium: 140 mmol/L (ref 134–144)
Total Protein: 8 g/dL (ref 6.0–8.5)
eGFR: 66 mL/min/{1.73_m2} (ref >59)

## 2021-03-04 LAB — HEMOGLOBIN A1C
Est. average glucose Bld gHb Est-mCnc: 151 mg/dL
Hgb A1c MFr Bld: 6.9 % — ABNORMAL HIGH (ref 4.8–5.6)

## 2021-03-04 LAB — MAGNESIUM: Magnesium: 2.3 mg/dL (ref 1.6–2.3)

## 2021-03-06 NOTE — Progress Notes (Signed)
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 The medication pravastatin helps to keep this under control. 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 If not we need to do a thyroid ultrasound to evaluate further?

## 2021-03-08 ENCOUNTER — Other Ambulatory Visit: Payer: Self-pay | Admitting: Nurse Practitioner

## 2021-03-08 DIAGNOSIS — E119 Type 2 diabetes mellitus without complications: Secondary | ICD-10-CM

## 2021-03-09 ENCOUNTER — Inpatient Hospital Stay
Admission: EM | Admit: 2021-03-09 | Discharge: 2021-03-11 | DRG: 193 | Disposition: A | Discharge status: Home / Self Care | Payer: Medicare Other | Attending: Internal Medicine | Admitting: Internal Medicine

## 2021-03-09 ENCOUNTER — Emergency Department: Payer: Medicare Other

## 2021-03-09 ENCOUNTER — Other Ambulatory Visit: Payer: Self-pay

## 2021-03-09 DIAGNOSIS — Z8249 Family history of ischemic heart disease and other diseases of the circulatory system: Secondary | ICD-10-CM | POA: Diagnosis not present

## 2021-03-09 DIAGNOSIS — E1122 Type 2 diabetes mellitus with diabetic chronic kidney disease: Secondary | ICD-10-CM | POA: Diagnosis present

## 2021-03-09 DIAGNOSIS — N182 Chronic kidney disease, stage 2 (mild): Secondary | ICD-10-CM | POA: Diagnosis present

## 2021-03-09 DIAGNOSIS — J440 Chronic obstructive pulmonary disease with acute lower respiratory infection: Secondary | ICD-10-CM | POA: Diagnosis present

## 2021-03-09 DIAGNOSIS — E7800 Pure hypercholesterolemia, unspecified: Secondary | ICD-10-CM | POA: Diagnosis present

## 2021-03-09 DIAGNOSIS — J441 Chronic obstructive pulmonary disease with (acute) exacerbation: Secondary | ICD-10-CM | POA: Diagnosis present

## 2021-03-09 DIAGNOSIS — Z79899 Other long term (current) drug therapy: Secondary | ICD-10-CM

## 2021-03-09 DIAGNOSIS — J9611 Chronic respiratory failure with hypoxia: Secondary | ICD-10-CM | POA: Diagnosis present

## 2021-03-09 DIAGNOSIS — K219 Gastro-esophageal reflux disease without esophagitis: Secondary | ICD-10-CM | POA: Diagnosis present

## 2021-03-09 DIAGNOSIS — K5792 Diverticulitis of intestine, part unspecified, without perforation or abscess without bleeding: Secondary | ICD-10-CM | POA: Diagnosis present

## 2021-03-09 DIAGNOSIS — R778 Other specified abnormalities of plasma proteins: Secondary | ICD-10-CM

## 2021-03-09 DIAGNOSIS — Z9981 Dependence on supplemental oxygen: Secondary | ICD-10-CM | POA: Diagnosis not present

## 2021-03-09 DIAGNOSIS — J449 Chronic obstructive pulmonary disease, unspecified: Secondary | ICD-10-CM

## 2021-03-09 DIAGNOSIS — Y95 Nosocomial condition: Secondary | ICD-10-CM | POA: Diagnosis present

## 2021-03-09 DIAGNOSIS — R0602 Shortness of breath: Secondary | ICD-10-CM | POA: Diagnosis not present

## 2021-03-09 DIAGNOSIS — Z87891 Personal history of nicotine dependence: Secondary | ICD-10-CM

## 2021-03-09 DIAGNOSIS — Z809 Family history of malignant neoplasm, unspecified: Secondary | ICD-10-CM

## 2021-03-09 DIAGNOSIS — E1169 Type 2 diabetes mellitus with other specified complication: Secondary | ICD-10-CM | POA: Diagnosis present

## 2021-03-09 DIAGNOSIS — Z888 Allergy status to other drugs, medicaments and biological substances status: Secondary | ICD-10-CM

## 2021-03-09 DIAGNOSIS — J189 Pneumonia, unspecified organism: Secondary | ICD-10-CM | POA: Diagnosis present

## 2021-03-09 DIAGNOSIS — Z85118 Personal history of other malignant neoplasm of bronchus and lung: Secondary | ICD-10-CM

## 2021-03-09 DIAGNOSIS — I10 Essential (primary) hypertension: Secondary | ICD-10-CM | POA: Diagnosis not present

## 2021-03-09 DIAGNOSIS — Z7901 Long term (current) use of anticoagulants: Secondary | ICD-10-CM

## 2021-03-09 DIAGNOSIS — Z20822 Contact with and (suspected) exposure to covid-19: Secondary | ICD-10-CM | POA: Diagnosis present

## 2021-03-09 DIAGNOSIS — Z880 Allergy status to penicillin: Secondary | ICD-10-CM | POA: Diagnosis not present

## 2021-03-09 DIAGNOSIS — Z8542 Personal history of malignant neoplasm of other parts of uterus: Secondary | ICD-10-CM | POA: Diagnosis not present

## 2021-03-09 DIAGNOSIS — I5032 Chronic diastolic (congestive) heart failure: Secondary | ICD-10-CM | POA: Diagnosis present

## 2021-03-09 DIAGNOSIS — I272 Pulmonary hypertension, unspecified: Secondary | ICD-10-CM | POA: Diagnosis present

## 2021-03-09 DIAGNOSIS — Y842 Radiological procedure and radiotherapy as the cause of abnormal reaction of the patient, or of later complication, without mention of misadventure at the time of the procedure: Secondary | ICD-10-CM | POA: Diagnosis present

## 2021-03-09 DIAGNOSIS — E785 Hyperlipidemia, unspecified: Secondary | ICD-10-CM | POA: Diagnosis not present

## 2021-03-09 DIAGNOSIS — Z90710 Acquired absence of both cervix and uterus: Secondary | ICD-10-CM

## 2021-03-09 DIAGNOSIS — J701 Chronic and other pulmonary manifestations due to radiation: Secondary | ICD-10-CM | POA: Diagnosis present

## 2021-03-09 DIAGNOSIS — Z7984 Long term (current) use of oral hypoglycemic drugs: Secondary | ICD-10-CM

## 2021-03-09 DIAGNOSIS — I2699 Other pulmonary embolism without acute cor pulmonale: Secondary | ICD-10-CM | POA: Diagnosis present

## 2021-03-09 DIAGNOSIS — I130 Hypertensive heart and chronic kidney disease with heart failure and stage 1 through stage 4 chronic kidney disease, or unspecified chronic kidney disease: Secondary | ICD-10-CM | POA: Diagnosis present

## 2021-03-09 DIAGNOSIS — Z86711 Personal history of pulmonary embolism: Secondary | ICD-10-CM

## 2021-03-09 DIAGNOSIS — R7989 Other specified abnormal findings of blood chemistry: Secondary | ICD-10-CM | POA: Diagnosis present

## 2021-03-09 LAB — CBC WITH DIFFERENTIAL/PLATELET
Abs Immature Granulocytes: 0.06 10*3/uL (ref 0.00–0.07)
Basophils Absolute: 0.1 10*3/uL (ref 0.0–0.1)
Basophils Relative: 1 %
Eosinophils Absolute: 0 10*3/uL (ref 0.0–0.5)
Eosinophils Relative: 0 %
HCT: 37.1 % (ref 36.0–46.0)
Hemoglobin: 12.1 g/dL (ref 12.0–15.0)
Immature Granulocytes: 1 %
Lymphocytes Relative: 11 %
Lymphs Abs: 1.3 10*3/uL (ref 0.7–4.0)
MCH: 27.8 pg (ref 26.0–34.0)
MCHC: 32.6 g/dL (ref 30.0–36.0)
MCV: 85.3 fL (ref 80.0–100.0)
Monocytes Absolute: 0.9 10*3/uL (ref 0.1–1.0)
Monocytes Relative: 8 %
Neutro Abs: 9.2 10*3/uL — ABNORMAL HIGH (ref 1.7–7.7)
Neutrophils Relative %: 79 %
Platelets: 248 10*3/uL (ref 150–400)
RBC: 4.35 MIL/uL (ref 3.87–5.11)
RDW: 17.3 % — ABNORMAL HIGH (ref 11.5–15.5)
WBC: 11.6 10*3/uL — ABNORMAL HIGH (ref 4.0–10.5)
nRBC: 0 % (ref 0.0–0.2)

## 2021-03-09 LAB — GLUCOSE, CAPILLARY
Glucose-Capillary: 97 mg/dL (ref 70–99)
Glucose-Capillary: 150 mg/dL — ABNORMAL HIGH (ref 70–99)

## 2021-03-09 LAB — COMPREHENSIVE METABOLIC PANEL
ALT: 14 U/L (ref 0–44)
AST: 20 U/L (ref 15–41)
Albumin: 3.8 g/dL (ref 3.5–5.0)
Alkaline Phosphatase: 53 U/L (ref 38–126)
Anion gap: 9 (ref 5–15)
BUN: 19 mg/dL (ref 8–23)
CO2: 27 mmol/L (ref 22–32)
Calcium: 9.5 mg/dL (ref 8.9–10.3)
Chloride: 96 mmol/L — ABNORMAL LOW (ref 98–111)
Creatinine, Ser: 1.25 mg/dL — ABNORMAL HIGH (ref 0.44–1.00)
GFR, Estimated: 43 mL/min — ABNORMAL LOW (ref >60)
Glucose, Bld: 244 mg/dL — ABNORMAL HIGH (ref 70–99)
Potassium: 3.9 mmol/L (ref 3.5–5.1)
Sodium: 132 mmol/L — ABNORMAL LOW (ref 135–145)
Total Bilirubin: 0.9 mg/dL (ref 0.3–1.2)
Total Protein: 7.9 g/dL (ref 6.5–8.1)

## 2021-03-09 LAB — RESP PANEL BY RT-PCR (FLU A&B, COVID) ARPGX2
Influenza A by PCR: NEGATIVE
Influenza B by PCR: NEGATIVE
SARS Coronavirus 2 by RT PCR: NEGATIVE

## 2021-03-09 LAB — TROPONIN I (HIGH SENSITIVITY)
Troponin I (High Sensitivity): 20 ng/L — ABNORMAL HIGH (ref <18)
Troponin I (High Sensitivity): 20 ng/L — ABNORMAL HIGH (ref <18)

## 2021-03-09 LAB — MRSA NEXT GEN BY PCR, NASAL: MRSA by PCR Next Gen: NOT DETECTED

## 2021-03-09 LAB — BRAIN NATRIURETIC PEPTIDE: B Natriuretic Peptide: 341.7 pg/mL — ABNORMAL HIGH (ref 0.0–100.0)

## 2021-03-09 MED ORDER — ASCORBIC ACID 500 MG PO TABS
500.0000 mg | ORAL_TABLET | ORAL | Status: DC
  Administered 2021-03-11: 500 mg via ORAL
  Filled 2021-03-09: qty 1

## 2021-03-09 MED ORDER — CINNAMON 500 MG PO TABS: 1000.0000 mg | ORAL_TABLET | Freq: Every day | ORAL | Status: DC

## 2021-03-09 MED ORDER — APIXABAN 5 MG PO TABS
5.0000 mg | ORAL_TABLET | Freq: Two times a day (BID) | ORAL | Status: DC
  Administered 2021-03-09 – 2021-03-11 (×4): 5 mg via ORAL
  Filled 2021-03-09 (×4): qty 1

## 2021-03-09 MED ORDER — DM-GUAIFENESIN ER 30-600 MG PO TB12: 1.0000 | ORAL_TABLET | Freq: Two times a day (BID) | ORAL | Status: DC | PRN

## 2021-03-09 MED ORDER — VANCOMYCIN HCL 1500 MG/300ML IV SOLN
1500.0000 mg | Freq: Once | INTRAVENOUS | Status: AC
  Administered 2021-03-09: 1500 mg via INTRAVENOUS
  Filled 2021-03-09: qty 300

## 2021-03-09 MED ORDER — SODIUM CHLORIDE 0.9 % IV SOLN
2.0000 g | Freq: Two times a day (BID) | INTRAVENOUS | Status: DC
  Administered 2021-03-09 – 2021-03-10 (×3): 2 g via INTRAVENOUS
  Filled 2021-03-09 (×4): qty 2

## 2021-03-09 MED ORDER — OCUVITE-LUTEIN PO CAPS
ORAL_CAPSULE | Freq: Every day | ORAL | Status: DC
  Administered 2021-03-10 – 2021-03-11 (×2): 1 via ORAL
  Filled 2021-03-09 (×2): qty 1

## 2021-03-09 MED ORDER — ALBUTEROL SULFATE (2.5 MG/3ML) 0.083% IN NEBU: 2.5000 mg | INHALATION_SOLUTION | RESPIRATORY_TRACT | Status: DC | PRN

## 2021-03-09 MED ORDER — VANCOMYCIN HCL 750 MG/150ML IV SOLN
750.0000 mg | INTRAVENOUS | Status: DC
  Filled 2021-03-09: qty 150

## 2021-03-09 MED ORDER — IPRATROPIUM-ALBUTEROL 0.5-2.5 (3) MG/3ML IN SOLN
3.0000 mL | Freq: Three times a day (TID) | RESPIRATORY_TRACT | Status: DC
  Administered 2021-03-10 – 2021-03-11 (×4): 3 mL via RESPIRATORY_TRACT
  Filled 2021-03-09 (×5): qty 3

## 2021-03-09 MED ORDER — ONDANSETRON HCL 4 MG/2ML IJ SOLN: 4.0000 mg | Freq: Three times a day (TID) | INTRAMUSCULAR | Status: DC | PRN

## 2021-03-09 MED ORDER — IPRATROPIUM-ALBUTEROL 0.5-2.5 (3) MG/3ML IN SOLN
3.0000 mL | RESPIRATORY_TRACT | Status: DC
  Administered 2021-03-09 (×2): 3 mL via RESPIRATORY_TRACT
  Filled 2021-03-09 (×2): qty 3

## 2021-03-09 MED ORDER — PRAVASTATIN SODIUM 20 MG PO TABS
10.0000 mg | ORAL_TABLET | Freq: Every day | ORAL | Status: DC
  Administered 2021-03-09 – 2021-03-10 (×2): 10 mg via ORAL
  Filled 2021-03-09 (×2): qty 1

## 2021-03-09 MED ORDER — ACETAMINOPHEN 325 MG PO TABS: 650.0000 mg | ORAL_TABLET | Freq: Four times a day (QID) | ORAL | Status: DC | PRN

## 2021-03-09 MED ORDER — HYDRALAZINE HCL 20 MG/ML IJ SOLN: 5.0000 mg | INTRAMUSCULAR | Status: DC | PRN

## 2021-03-09 MED ORDER — INSULIN ASPART 100 UNIT/ML IJ SOLN: 0.0000 [IU] | Freq: Every day | INTRAMUSCULAR | Status: DC

## 2021-03-09 MED ORDER — PANTOPRAZOLE SODIUM 40 MG PO TBEC
40.0000 mg | DELAYED_RELEASE_TABLET | Freq: Every day | ORAL | Status: DC
  Administered 2021-03-10 – 2021-03-11 (×2): 40 mg via ORAL
  Filled 2021-03-09 (×2): qty 1

## 2021-03-09 MED ORDER — FUROSEMIDE 20 MG PO TABS
20.0000 mg | ORAL_TABLET | Freq: Every day | ORAL | Status: DC
  Administered 2021-03-10 – 2021-03-11 (×2): 20 mg via ORAL
  Filled 2021-03-09 (×2): qty 1

## 2021-03-09 MED ORDER — ADULT MULTIVITAMIN W/MINERALS CH
1.0000 | ORAL_TABLET | Freq: Every day | ORAL | Status: DC
  Administered 2021-03-10 – 2021-03-11 (×2): 1 via ORAL
  Filled 2021-03-09 (×2): qty 1

## 2021-03-09 MED ORDER — VITAMIN E 45 MG (100 UNIT) PO CAPS
1000.0000 [IU] | ORAL_CAPSULE | ORAL | Status: DC
  Administered 2021-03-11: 1000 [IU] via ORAL
  Filled 2021-03-09: qty 10

## 2021-03-09 MED ORDER — INSULIN ASPART 100 UNIT/ML IJ SOLN
0.0000 [IU] | Freq: Three times a day (TID) | INTRAMUSCULAR | Status: DC
  Administered 2021-03-10: 1 [IU] via SUBCUTANEOUS
  Administered 2021-03-10: 2 [IU] via SUBCUTANEOUS
  Administered 2021-03-11: 1 [IU] via SUBCUTANEOUS
  Filled 2021-03-09 (×3): qty 1

## 2021-03-09 NOTE — ED Notes (Signed)
Walking o2 sats with 4lnc 92%

## 2021-03-09 NOTE — ED Provider Notes (Signed)
[facility] Emergency Department Provider Note  ____________________________________________   Event Date/Time   First MD Initiated Contact with Patient 03/09/21 1109     (approximate)  I have reviewed the triage vital signs and the nursing notes.   HISTORY  Chief Complaint Shortness of Breath    HPI Brianna Adams is a 80 y.o. female with diabetes CHF, COPD, PE on Eliquis on 4 L of oxygen at baseline who comes in for worsening shortness of breath.  Patient denies any leg swelling.  States that she feels like her weight is about the same.  However she states that she has been more short of breath since yesterday, constant, worse with exertion.  Patient states that she has been taking Lasix.  On review of records patient had an admission back in April 2022 where she was managed for CHF exacerbation and treated with IV diuresis.  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   Type 2 diabetes mellitus without  complication, without long-term current use of insulin ([place]) 03/14/2019    Past Surgical History:  Procedure Laterality Date   ABDOMINAL HYSTERECTOMY     cataract surgery  2012 and 2017   COLONOSCOPY     FIBEROPTIC BRONCHOSCOPY     GANGLION CYST EXCISION Left 09/11/2020   Procedure: EXCISION VOLAR RADIAL GANGLION OF LEFT WRIST;  Surgeon: Daryll Brod, MD;  Location: [facility];  Service: Orthopedics;  Laterality: Left;  AXILLARY BLOCK   resection of lung cancer  2018   right lower lobe non-anatomocal lung resection wedge     right thorascoscopy      Prior to Admission medications   Medication Sig Start Date End Date Taking? Authorizing Provider  acetaminophen (TYLENOL) 500 MG tablet Take 1,000 mg by mouth every 6 (six) hours as needed for mild pain or fever.  01/30/17   [provider]  calcium carbonate (OSCAL) 1500 (600 Ca) MG TABS tablet Take 1,200 mg by mouth daily.    [provider]  Cinnamon 500 MG TABS Take 1,000 mg by mouth daily.     [provider]  dextromethorphan-guaiFENesin (ROBITUSSIN-DM) 10-100 MG/5ML liquid Take 10 mLs by mouth at bedtime. Patient not taking: Reported on 03/03/2021    [provider]  ELIQUIS 5 MG TABS tablet TAKE 1 TABLET BY MOUTH TWICE A DAY 01/14/21   [name], [name]  empagliflozin (JARDIANCE) 10 MG TABS tablet Take 1 tablet (10 mg  total) by mouth daily before breakfast. 12/19/20   Katherine Roan, MD  Fluticasone-Umeclidin-Vilant (TRELEGY ELLIPTA) 100-62.5-25 MCG/INH AEPB Inhale 100 mcg into the lungs daily. 02/20/21   Martyn Ehrich, NP  furosemide (LASIX) 20 MG tablet TAKE 1 TABLET BY MOUTH EVERY DAY Patient taking differently: Take 20 mg by mouth daily. 04/14/20   [name], [name]  losartan (COZAAR) 25 MG tablet Take 0.5 tablets (12.5 mg total) by mouth daily. 12/19/20 03/19/21  Katherine Roan, MD  metFORMIN (GLUCOPHAGE) 1000 MG tablet TAKE 1 TABLET (1,000 MG TOTAL) BY MOUTH 2 (TWO) TIMES DAILY WITH A MEAL.  02/16/21 02/16/22  [name], [name]  Multiple Vitamins-Minerals (OCUVITE EYE HEALTH FORMULA PO) Take 1 capsule by mouth daily.    [provider]  omeprazole (PRILOSEC) 40 MG capsule TAKE 1 CAPSULE BY MOUTH EVERY DAY 02/12/21   [name], [name]  pravastatin (PRAVACHOL) 10 MG tablet TAKE 1 TABLET BY MOUTH EVERY DAY Patient taking differently: Take 10 mg by mouth daily. 10/21/20   [name], [name]  Pseudoephedrine-guaiFENesin [facility] D PO) Take by mouth. nightly    [provider]  vitamin C (ASCORBIC ACID) 500 MG tablet Take 500 mg by mouth every other day.    [provider]  vitamin E 1000 UNIT capsule Take 1,000 Units by mouth every Monday, Wednesday, and Friday at 6 PM.    [provider]    Allergies Ace inhibitors, Lisinopril, Penicillin g, Atorvastatin, Cortisone, Hydrocortisone, Meloxicam, and Pravastatin  Family History  Problem Relation Age of Onset   Hypertension Mother    Heart attack Father    Cancer Maternal Aunt     Social History Social History   Tobacco Use   Smoking status: Former    Packs/day: 1.00    Years: 58.00    Pack years: 58.00    Types: Cigarettes    Quit date: 10/22/2015    Years since quitting: 5.3   Smokeless tobacco: Never   Tobacco comments:    congratulated  Vaping Use   Vaping Use: Never used  Substance Use Topics   Alcohol use: Never   Drug use: Never      Review of Systems Constitutional: No fever/chills Eyes: No visual changes. ENT: No sore throat. Cardiovascular: No chest pain Respiratory: Positive for SOB Gastrointestinal: No abdominal pain.  No nausea, no vomiting.  No diarrhea.  No constipation. Genitourinary: Negative for dysuria. Musculoskeletal: Negative for back pain. Skin: Negative for rash. Neurological: Negative for headaches, focal weakness or numbness. All other ROS negative ____________________________________________   PHYSICAL EXAM:  VITAL SIGNS: ED Triage Vitals  [03/09/21 1110]  Enc Vitals Group     BP      Pulse      Resp      Temp      Temp src      SpO2      Weight 139 lb (63 kg)     Height _0  (1.676 m)     Head Circumference      Peak Flow      Pain Score 0     Pain Loc      Pain Edu?      Excl. in [place]?     Constitutional: Alert and oriented. Well appearing and in no acute distress. Eyes: Conjunctivae are normal. EOMI. Head: Atraumatic. Nose: No congestion/rhinnorhea. Mouth/Throat: Mucous membranes are moist.   Neck: No stridor. Trachea Midline. FROM Cardiovascular: Normal rate, regular rhythm. Grossly normal heart sounds.  Good peripheral  circulation. Respiratory: Some mild increased work of breathing but clear lungs bilaterally on her 4 L satting 96 Gastrointestinal: Soft and nontender. No distention. No abdominal bruits.  Musculoskeletal: No lower extremity tenderness nor edema.  No joint effusions. Neurologic:  Normal speech and language. No gross focal neurologic deficits are appreciated.  Skin:  Skin is warm, dry and intact. No rash noted. Psychiatric: Mood and affect are normal. Speech and behavior are normal. GU: Deferred   ____________________________________________   LABS (all labs ordered are listed, but only abnormal results are displayed)  Labs Reviewed  RESP PANEL BY RT-PCR (FLU A&B, COVID) ARPGX2  CBC WITH DIFFERENTIAL/PLATELET  COMPREHENSIVE METABOLIC PANEL  BRAIN NATRIURETIC PEPTIDE  TROPONIN I (HIGH SENSITIVITY)   ____________________________________________   ED ECG REPORT I, Vanessa [place], the attending physician, personally viewed and interpreted this ECG.  Normal sinus rate of 91, no st elevation, normal intervals  ____________________________________________  RADIOLOGY Robert Bellow, personally viewed and evaluated these images (plain radiographs) as part of my medical decision making, as well as reviewing the written report by the radiologist.  ED MD interpretation:  possible RUQ PNA    Official radiology report(s): DG Chest Portable 1 View  Result Date: 03/09/2021 CLINICAL DATA:  Shortness of breath.  History of lung cancer. EXAM: PORTABLE CHEST 1 VIEW COMPARISON:  12/09/20 FINDINGS: Stable cardiomediastinal contours. Diffuse, chronic reticular interstitial opacities are identified throughout both lungs compatible with chronic lung disease. No pleural effusion or edema. Asymmetric increased opacity within the right upper lobe may reflect superimposed pneumonia. Treated tumor within the left upper lobe appears unchanged from previous exam. IMPRESSION: Chronic lung disease with asymmetric increased opacity within the right upper lobe may reflect superimposed pneumonia. Electronically Signed   By: Kerby Moors M.D.   On: 03/09/2021 11:39    ____________________________________________   PROCEDURES  Procedure(s) performed (including Critical Care):  .1-3 Lead EKG Interpretation  Date/Time: 03/09/2021 3:11 PM Performed by: Vanessa [place], MD Authorized by: Vanessa , MD     Interpretation: normal     ECG rate:  80s   ECG rate assessment: normal     Rhythm: sinus rhythm     Ectopy: none     Conduction: normal     ____________________________________________   INITIAL IMPRESSION / ASSESSMENT AND PLAN / ED COURSE   Brianna Adams was evaluated in Emergency Department on 03/09/2021 for the symptoms described in the history of present illness. She was evaluated in the context of the global COVID-19 pandemic, which necessitated consideration that the patient might be at risk for infection with the SARS-CoV-2 virus that causes COVID-19. Institutional protocols and algorithms that pertain to the evaluation of patients at risk for COVID-19 are in a state of rapid change based on information released by regulatory bodies including the CDC and federal and state organizations. These policies and algorithms were followed during the patient's care in the ED.     Pt presents with  SOB. Differential includes: PNA-will get xray to evaluation Anemia-CBC to evaluate ACS- will get trops Arrhythmia-Will get EKG and keep on monitor.  COVID- will get testing per algorithm. PE-lower suspicion given no risk factors and other cause more likely  Patient's chest x-ray is concerning for pneumonia.  We did ambulate patient she dropped down to 91-92% on her 4 L.  She does look dyspneic.  Nothing this is a blood clot given she has been compliant with her Eliquis.  Given her age and comorbidities we will discussed the  hospital team for admission    ____________________________________________   FINAL CLINICAL IMPRESSION(S) / ED DIAGNOSES   Final diagnoses:  Pneumonia of right upper lobe due to infectious organism     MEDICATIONS GIVEN DURING THIS VISIT:  Medications  ipratropium-albuterol (DUONEB) 0.5-2.5 (3) MG/3ML nebulizer solution 3 mL (has no administration in time range)  albuterol (PROVENTIL) (2.5 MG/3ML) 0.083% nebulizer solution 2.5 mg (has no administration in time range)  dextromethorphan-guaiFENesin (MUCINEX DM) 30-600 MG per 12 hr tablet 1 tablet (has no administration in time range)  insulin aspart (novoLOG) injection 0-5 Units (has no administration in time range)  insulin aspart (novoLOG) injection 0-9 Units (has no administration in time range)  ondansetron (ZOFRAN) injection 4 mg (has no administration in time range)  acetaminophen (TYLENOL) tablet 650 mg (has no administration in time range)  hydrALAZINE (APRESOLINE) injection 5 mg (has no administration in time range)  vancomycin (VANCOREADY) IVPB 1500 mg/300 mL (has no administration in time range)  vancomycin (VANCOREADY) IVPB 750 mg/150 mL (has no administration in time range)  ceFEPIme (MAXIPIME) 2 g in sodium chloride 0.9 % 100 mL IVPB (has no administration in time range)     ED Discharge Orders     None        Note:  This document was prepared using Dragon voice recognition software and  may include unintentional dictation errors.   Vanessa [place], MD 03/09/21 [phone]

## 2021-03-09 NOTE — Progress Notes (Signed)
Pharmacy Antibiotic Note  Brianna Adams is a 80 y.o. female admitted on 03/09/2021. Pharmacy has been consulted for vancomycin and cefepime dosing for pneumonia.  Plan: Vancomycin 1500 mg IV x 1 followed by 750 mg IV Q 24 hrs  Goal AUC 400-550 Expected AUC: 519 SCr used: 1.25 (appears to be up slightly from 0.88 on 6/28)  Cefepime 2 g IV Q 12 hrs   Height: _0  (167.6 cm) Weight: 63 kg (139 lb) IBW/kg (Calculated) : 59.3  Temp (24hrs), Avg:97.5 F (36.4 C), Min:97.5 F (36.4 C), Max:97.5 F (36.4 C)  Recent Labs  Lab 03/03/21 1150 03/09/21 1137  WBC  --  11.6*  CREATININE 0.88 1.25*    Estimated Creatinine Clearance: 33 mL/min (A) (by C-G formula based on SCr of 1.25 mg/dL (H)).    Allergies  Allergen Reactions   Ace Inhibitors Swelling   Lisinopril Swelling   Penicillin G Hives, Shortness Of Breath and Rash   Atorvastatin Other (See Comments)    Myalgias (Muscle Pain)    Cortisone Other (See Comments)    Unknown reaction   Hydrocortisone Other (See Comments)    Unknown reaction   Meloxicam Other (See Comments)    Bloody stools   Pravastatin Other (See Comments)    Chest Pain if high dosage     Antimicrobials this admission: Vancomycin 7/4 >>  Cefepime 7/4 >>   Microbiology results: 7/4 BCx: pending 7/4 MRSA PCR: ordered  Thank you for allowing pharmacy to be a part of this patient's care.  Tawnya Crook, PharmD 03/09/2021 2:16 PM

## 2021-03-09 NOTE — H&P (Addendum)
History and Physical    [name] WHQ:[id number] DOB: 1941-04-12 DOA: 03/09/2021  Referring MD/NP/PA:   PCP: [name], [name]   Patient coming from:  The patient is coming from home.  At baseline, pt is independent for most of ADL.        Chief Complaint: SOB  HPI: Brianna Adams is a 80 y.o. female with medical history significant of hypertension, hyperlipidemia, diabetes mellitus, COPD on 4 L oxygen, GERD, uterine cancer (s/p for hysterectomy), PE on Eliquis, lung cancer (s/p of surgery and radiation therapy), pulmonary hypertension, dCHF, diverticulitis, radiation fibrosis of lung, CKD-2, who presents with shortness of breath.  Patient states that her shortness of breath started yesterday, which has been progressively worsening.  Patient has cough with little clear mucus production.  Patient also has chest congestion, currently no chest pain.  She had fever of 100.5 last night, which has resolved now.  No chills.  Denies nausea, vomiting, diarrhea or abdominal pain.  No symptoms of UTI.  Patient states that she has been taking her Eliquis consistently.  No leg edema.  No body weight gain recently.  ED Course: pt was found to have WBC 11.6, troponin level 20, BNP 341, negative COVID PCR, slightly worsening renal function, temperature normal, blood pressure 105/56, heart rate 88, RR 31, oxygen saturation 91-99% on 4 L home level of oxygen.  Chest x-ray showed possible right upper lobe infiltration.  VBG with a pH of 7.44, CO2 45. Patient is admitted to progressive bed as inpatient.  Review of Systems:   General: Had fever, no hills, no body weight gain, has fatigue HEENT: no blurry vision, hearing changes or sore throat Respiratory: Has dyspnea, coughing, no wheezing CV: no chest pain, no palpitations GI: no nausea, vomiting, abdominal pain, diarrhea, constipation GU: no dysuria, burning on urination, increased urinary frequency, hematuria  Ext: no leg edema Neuro: no unilateral  weakness, numbness, or tingling, no vision change or hearing loss Skin: no rash, no skin tear. MSK: No muscle spasm, no deformity, no limitation of range of movement in spin Heme: No easy bruising.  Travel history: No recent long distant travel.  Allergy:  Allergies  Allergen Reactions   Ace Inhibitors Swelling   Lisinopril Swelling   Penicillin G Hives, Shortness Of Breath and Rash   Atorvastatin Other (See Comments)    Myalgias (Muscle Pain)    Cortisone Other (See Comments)    Unknown reaction   Hydrocortisone Other (See Comments)    Unknown reaction   Meloxicam Other (See Comments)    Bloody stools   Pravastatin Other (See Comments)    Chest Pain if high dosage     Past Medical History:  Diagnosis Date   CHF (congestive heart failure) (HCC)    COPD (chronic obstructive pulmonary disease) (HCC)    Diabetes (HCC)    type 2   Diverticulitis    Dyspnea    On Oxygen 3L via [place]    GERD (gastroesophageal reflux disease)    High cholesterol    Lung cancer (HCC)    PE (pulmonary thromboembolism) ([place]) 02/29/2020   Pulmonary hypertension ([place])    Uterine cancer ([place])     Past Surgical History:  Procedure Laterality Date   ABDOMINAL HYSTERECTOMY     cataract surgery  2012 and 2017   COLONOSCOPY     FIBEROPTIC BRONCHOSCOPY     GANGLION CYST EXCISION Left 09/11/2020   Procedure: EXCISION VOLAR RADIAL GANGLION OF LEFT WRIST;  Surgeon: Daryll Brod, MD;  Location: [facility];  Service: Orthopedics;  Laterality: Left;  AXILLARY BLOCK   resection of lung cancer  2018   right lower lobe non-anatomocal lung resection wedge     right thorascoscopy      Social History:  reports that she quit smoking about 5 years ago. Her smoking use included cigarettes. She has a 58.00 pack-year smoking history. She has never used smokeless tobacco. She reports that she does not drink alcohol and does not use drugs.  Family History:  Family History  Problem Relation Age of Onset   Hypertension  Mother    Heart attack Father    Cancer Maternal Aunt      Prior to Admission medications   Medication Sig Start Date End Date Taking? Authorizing Provider  acetaminophen (TYLENOL) 500 MG tablet Take 1,000 mg by mouth every 6 (six) hours as needed for mild pain or fever.  01/30/17   [provider]  calcium carbonate (OSCAL) 1500 (600 Ca) MG TABS tablet Take 1,200 mg by mouth daily.    [provider]  Cinnamon 500 MG TABS Take 1,000 mg by mouth daily.     [provider]  dextromethorphan-guaiFENesin (ROBITUSSIN-DM) 10-100 MG/5ML liquid Take 10 mLs by mouth at bedtime. Patient not taking: Reported on 03/03/2021    [provider]  ELIQUIS 5 MG TABS tablet TAKE 1 TABLET BY MOUTH TWICE A DAY 01/14/21   [name], [name]  empagliflozin (JARDIANCE) 10 MG TABS tablet Take 1 tablet (10 mg total) by mouth daily before breakfast. 12/19/20   Katherine Roan, MD  Fluticasone-Umeclidin-Vilant (TRELEGY ELLIPTA) 100-62.5-25 MCG/INH AEPB Inhale 100 mcg into the lungs daily. 02/20/21   Martyn Ehrich, NP  furosemide (LASIX) 20 MG tablet TAKE 1 TABLET BY MOUTH EVERY DAY Patient taking differently: Take 20 mg by mouth daily. 04/14/20   [name], [name]  losartan (COZAAR) 25 MG tablet Take 0.5 tablets (12.5 mg total) by mouth daily. 12/19/20 03/19/21  Katherine Roan, MD  metFORMIN (GLUCOPHAGE) 1000 MG tablet TAKE 1 TABLET (1,000 MG TOTAL) BY MOUTH 2 (TWO) TIMES DAILY WITH A MEAL. 02/16/21 02/16/22  [name], [name]  Multiple Vitamins-Minerals (OCUVITE EYE HEALTH FORMULA PO) Take 1 capsule by mouth daily.    [provider]  omeprazole (PRILOSEC) 40 MG capsule TAKE 1 CAPSULE BY MOUTH EVERY DAY 02/12/21   [name], [name]  pravastatin (PRAVACHOL) 10 MG tablet TAKE 1 TABLET BY MOUTH EVERY DAY Patient taking differently: Take 10 mg by mouth daily. 10/21/20   [name], [name]  Pseudoephedrine-guaiFENesin [facility] D PO) Take by mouth. nightly    [provider]  vitamin C (ASCORBIC ACID) 500 MG tablet Take 500 mg by mouth every other day.    [provider]  vitamin E 1000 UNIT capsule Take 1,000 Units by mouth every Monday, Wednesday, and Friday at 6 PM.    [provider]    Physical Exam: Vitals:   03/09/21 1215 03/09/21 1228 03/09/21 1334 03/09/21 1519  BP: 110/70 (!) 102/58 105/66 114/62  Pulse: 88 96 88 87  Resp: (!) 27 (!) 28 (!) 31 (!) 22  Temp:    98.4 F (36.9 C)  TempSrc:    Oral  SpO2: 95% 91% 95% 93%  Weight:    60.4 kg  Height:    5' 2" (1.575 m)   General: Not in acute distress HEENT:       Eyes: PERRL, EOMI, no scleral icterus.  ENT: No discharge from the ears and nose, no pharynx injection, no tonsillar enlargement.        Neck: No JVD, no bruit, no mass felt. Heme: No neck lymph node enlargement. Cardiac: S1/S2, RRR, No murmurs, No gallops or rubs. Respiratory: Slightly decreased air movement bilaterally.  No wheezing or rhonchi. GI: Soft, nondistended, nontender, no rebound pain, no organomegaly, BS present. GU: No hematuria Ext: No pitting leg edema bilaterally. 1+DP/PT pulse bilaterally. Musculoskeletal: No joint deformities, No joint redness or warmth, no limitation of ROM in spin. Skin: No rashes.  Neuro: Alert, oriented X3, cranial nerves II-XII grossly intact, moves all extremities normally.  Psych: Patient is not psychotic, no suicidal or hemocidal ideation.  Labs on Admission: I have personally reviewed following labs and imaging studies  CBC: Recent Labs  Lab 03/09/21 1137  WBC 11.6*  NEUTROABS 9.2*  HGB 12.1  HCT 37.1  MCV 85.3  PLT 814   Basic Metabolic Panel: Recent Labs  Lab 03/03/21 1150 03/09/21 1137  NA 140 132*  K 4.7 3.9  CL 98 96*  CO2 20 27  GLUCOSE 118* 244*  BUN 17 19  CREATININE 0.88 1.25*  CALCIUM 10.2 9.5  MG 2.3  --    GFR: Estimated Creatinine Clearance: 30.2 mL/min (A) (by C-G formula based on SCr of 1.25 mg/dL (H)). Liver  Function Tests: Recent Labs  Lab 03/03/21 1150 03/09/21 1137  AST 20 20  ALT 12 14  ALKPHOS 72 53  BILITOT 0.3 0.9  PROT 8.0 7.9  ALBUMIN 4.9* 3.8   No results for input(s): LIPASE, AMYLASE in the last 168 hours. No results for input(s): AMMONIA in the last 168 hours. Coagulation Profile: No results for input(s): INR, PROTIME in the last 168 hours. Cardiac Enzymes: No results for input(s): CKTOTAL, CKMB, CKMBINDEX, TROPONINI in the last 168 hours. BNP (last 3 results) No results for input(s): PROBNP in the last 8760 hours. HbA1C: No results for input(s): HGBA1C in the last 72 hours. CBG: No results for input(s): GLUCAP in the last 168 hours. Lipid Profile: No results for input(s): CHOL, HDL, LDLCALC, TRIG, CHOLHDL, LDLDIRECT in the last 72 hours. Thyroid Function Tests: No results for input(s): TSH, T4TOTAL, FREET4, T3FREE, THYROIDAB in the last 72 hours. Anemia Panel: No results for input(s): VITAMINB12, FOLATE, FERRITIN, TIBC, IRON, RETICCTPCT in the last 72 hours. Urine analysis:    Component Value Date/Time   BILIRUBINUR negative 10/09/2020 1205   PROTEINUR Negative 10/09/2020 1205   UROBILINOGEN 0.2 10/09/2020 1205   NITRITE negative 10/09/2020 1205   LEUKOCYTESUR Negative 10/09/2020 1205   Sepsis Labs: _0 (procalcitonin:4,lacticidven:4) ) Recent Results (from the past 240 hour(s))  Resp Panel by RT-PCR (Flu A&B, Covid) Nasopharyngeal Swab     Status: None   Collection Time: 03/09/21 11:37 AM   Specimen: Nasopharyngeal Swab; Nasopharyngeal(NP) swabs in vial transport medium  Result Value Ref Range Status   SARS Coronavirus 2 by RT PCR NEGATIVE NEGATIVE Final    Comment: (NOTE) SARS-CoV-2 target nucleic acids are NOT DETECTED.  The SARS-CoV-2 RNA is generally detectable in upper respiratory specimens during the acute phase of infection. The lowest concentration of SARS-CoV-2 viral copies this assay can detect is 138 copies/mL. A negative result does  not preclude SARS-Cov-2 infection and should not be used as the sole basis for treatment or other patient management decisions. A negative result may occur with  improper specimen collection/handling, submission of specimen other than nasopharyngeal swab, presence of viral mutation(s) within the areas targeted by  this assay, and inadequate number of viral copies(<138 copies/mL). A negative result must be combined with clinical observations, patient history, and epidemiological information. The expected result is Negative.  Fact Sheet for Patients:  EntrepreneurPulse.com.au  Fact Sheet for Healthcare Providers:  IncredibleEmployment.be  This test is no t yet approved or cleared by the [place] FDA and  has been authorized for detection and/or diagnosis of SARS-CoV-2 by FDA under an Emergency Use Authorization (EUA). This EUA will remain  in effect (meaning this test can be used) for the duration of the COVID-19 declaration under Section 564(b)(1) of the Act, 21 U.S.C.section 360bbb-3(b)(1), unless the authorization is terminated  or revoked sooner.       Influenza A by PCR NEGATIVE NEGATIVE Final   Influenza B by PCR NEGATIVE NEGATIVE Final    Comment: (NOTE) The Xpert Xpress SARS-CoV-2/FLU/RSV plus assay is intended as an aid in the diagnosis of influenza from Nasopharyngeal swab specimens and should not be used as a sole basis for treatment. Nasal washings and aspirates are unacceptable for Xpert Xpress SARS-CoV-2/FLU/RSV testing.  Fact Sheet for Patients: EntrepreneurPulse.com.au  Fact Sheet for Healthcare Providers: IncredibleEmployment.be  This test is not yet approved or cleared by the [place] FDA and has been authorized for detection and/or diagnosis of SARS-CoV-2 by FDA under an Emergency Use Authorization (EUA). This EUA will remain in effect (meaning this test can be used) for the  duration of the COVID-19 declaration under Section 564(b)(1) of the Act, 21 U.S.C. section 360bbb-3(b)(1), unless the authorization is terminated or revoked.  Performed at [facility], [address] [place], [place] [address]      Radiological Exams on Admission: DG Chest Portable 1 View  Result Date: 03/09/2021 CLINICAL DATA:  Shortness of breath.  History of lung cancer. EXAM: PORTABLE CHEST 1 VIEW COMPARISON:  12/09/20 FINDINGS: Stable cardiomediastinal contours. Diffuse, chronic reticular interstitial opacities are identified throughout both lungs compatible with chronic lung disease. No pleural effusion or edema. Asymmetric increased opacity within the right upper lobe may reflect superimposed pneumonia. Treated tumor within the left upper lobe appears unchanged from previous exam. IMPRESSION: Chronic lung disease with asymmetric increased opacity within the right upper lobe may reflect superimposed pneumonia. Electronically Signed   By: Kerby Moors M.D.   On: 03/09/2021 11:39     EKG: I have personally reviewed.  Sinus rhythm, QTC 436, LAE, low voltage, early R wave progression  Assessment/Plan Principal Problem:   HCAP (healthcare-associated pneumonia) Active Problems:   Pulmonary hypertension, unspecified (HCC)   Chronic respiratory failure with hypoxia (HCC)   GERD (gastroesophageal reflux disease)   Pulmonary embolism (HCC)   CKD (chronic kidney disease), stage II   Chronic diastolic CHF (congestive heart failure) (HCC)   HTN (hypertension)   HLD (hyperlipidemia)   COPD (chronic obstructive pulmonary disease) (HCC)   Elevated troponin   Type II diabetes mellitus with renal manifestations ([place])   HCAP (healthcare-associated pneumonia): Patient has worsening shortness of breath, likely due to HCAP.  Patient had fever last night and mild leukocytosis, clinically consistent with pneumonia.  Chest x-ray showed possible infiltration in right upper lobe.  Patient  has mildly elevated BNP 341, but no leg edema or JVD.  Does not seem to have CHF exacerbation.  Patient does not have rhonchi or wheezing, does not seem to have COPD exacerbation.  Patient does not meet criteria for sepsis.  -Admitted to progressive unit as inpatient - IV Vancomycin and cefepime - Mucinex for cough  -  Bronchodilators - Urine legionella and S. pneumococcal antigen - Follow up blood culture x2, sputum culture   Chronic respiratory failure with hypoxia and COPD: -Bronchodilators as above  Chronic diastolic CHF and pulmonary hypertension, unspecified ([place]): 2D echo on 12/10/2020 showed EF of 50 to 60% with grade 1 diastolic dysfunction.  Patient does not have weight gain recently, no leg edema or JVD.  CHF seem to be compensated. -Continue home Lasix 20 mg daily  GERD (gastroesophageal reflux disease) -Protonix  Pulmonary embolism (HCC) -Eliquis  CKD (chronic kidney disease), stage II: Slightly worsening.  Baseline creatinine 0.89 on 1/60/22.  Her creatinine is 1.25, BUN 19. -Hold Cozaar due to worsening renal function and softer blood pressure  HTN (hypertension): Blood pressure 105/56 -Hold Cozaar as above -Patient is Lasix -IV hydralazine as needed  HLD (hyperlipidemia) -Pravastatin  Elevated troponin: Troponin is minimally elevated to 20, no chest pain, possibly due to demand ischemia or nonspecific change if not increase further -Trend troponin -On pravastatin -Check A1c, FLP  Type II diabetes mellitus with renal manifestations: Recent A1c 6.9, well controlled.  Patient is taking metformin and Jardiance. -Sliding scale insulin     DVT ppx: on Eliquis Code Status: Full code per pt and her daughter Family Communication: Yes, patient's  daughter  at bed side Disposition Plan:  Anticipate discharge back to previous environment Consults called:  none Admission status and Level of care: Progressive Cardiac:    as inpt      Status is: Inpatient  Remains  inpatient appropriate because:Inpatient level of care appropriate due to severity of illness  Dispo: The patient is from: Home              Anticipated d/c is to: Home              Patient currently is not medically stable to d/c.   Difficult to place patient No           Date of Service 03/09/2021    Ivor Costa [facility]   If 7PM-7AM, please contact night-coverage [email or web address] 03/09/2021, 4:00 PM

## 2021-03-09 NOTE — ED Triage Notes (Signed)
Pt here with SOB that started yesterday but was not doing anything strenous. Pt states she has been abnormally tired and sleepy. Pt wears 4L BNC at baseline.

## 2021-03-09 NOTE — ED Notes (Signed)
Called lab to draw blood cultures and troponin

## 2021-03-09 NOTE — ED Notes (Signed)
John RN aware of assigned bed

## 2021-03-10 ENCOUNTER — Encounter: Payer: Self-pay | Admitting: Nurse Practitioner

## 2021-03-10 LAB — BLOOD GAS, VENOUS
Acid-Base Excess: 5.7 mmol/L — ABNORMAL HIGH (ref 0.0–2.0)
Bicarbonate: 30.6 mmol/L — ABNORMAL HIGH (ref 20.0–28.0)
O2 Saturation: 62.6 %
Patient temperature: 37
pCO2, Ven: 45 mmHg (ref 44.0–60.0)
pH, Ven: 7.44 — ABNORMAL HIGH (ref 7.250–7.430)

## 2021-03-10 LAB — LIPID PANEL
Cholesterol: 151 mg/dL (ref 0–200)
HDL: 51 mg/dL (ref >40)
LDL Cholesterol: 84 mg/dL (ref 0–99)
Total CHOL/HDL Ratio: 3 RATIO
Triglycerides: 80 mg/dL (ref <150)
VLDL: 16 mg/dL (ref 0–40)

## 2021-03-10 LAB — CBC
HCT: 32.8 % — ABNORMAL LOW (ref 36.0–46.0)
Hemoglobin: 10.7 g/dL — ABNORMAL LOW (ref 12.0–15.0)
MCH: 27.8 pg (ref 26.0–34.0)
MCHC: 32.6 g/dL (ref 30.0–36.0)
MCV: 85.2 fL (ref 80.0–100.0)
Platelets: 226 10*3/uL (ref 150–400)
RBC: 3.85 MIL/uL — ABNORMAL LOW (ref 3.87–5.11)
RDW: 17.4 % — ABNORMAL HIGH (ref 11.5–15.5)
WBC: 10.4 10*3/uL (ref 4.0–10.5)
nRBC: 0 % (ref 0.0–0.2)

## 2021-03-10 LAB — GLUCOSE, CAPILLARY
Glucose-Capillary: 162 mg/dL — ABNORMAL HIGH (ref 70–99)
Glucose-Capillary: 150 mg/dL — ABNORMAL HIGH (ref 70–99)
Glucose-Capillary: 134 mg/dL — ABNORMAL HIGH (ref 70–99)
Glucose-Capillary: 148 mg/dL — ABNORMAL HIGH (ref 70–99)

## 2021-03-10 LAB — BASIC METABOLIC PANEL
Anion gap: 9 (ref 5–15)
BUN: 21 mg/dL (ref 8–23)
CO2: 24 mmol/L (ref 22–32)
Calcium: 9.2 mg/dL (ref 8.9–10.3)
Chloride: 103 mmol/L (ref 98–111)
Creatinine, Ser: 0.91 mg/dL (ref 0.44–1.00)
GFR, Estimated: >60 mL/min (ref >60)
Glucose, Bld: 149 mg/dL — ABNORMAL HIGH (ref 70–99)
Potassium: 3.7 mmol/L (ref 3.5–5.1)
Sodium: 136 mmol/L (ref 135–145)

## 2021-03-10 LAB — STREP PNEUMONIAE URINARY ANTIGEN: Strep Pneumo Urinary Antigen: NEGATIVE

## 2021-03-10 MED ORDER — AZITHROMYCIN 250 MG PO TABS
500.0000 mg | ORAL_TABLET | Freq: Every day | ORAL | Status: DC
  Administered 2021-03-10 – 2021-03-11 (×2): 500 mg via ORAL
  Filled 2021-03-10 (×2): qty 2

## 2021-03-10 MED ORDER — SODIUM CHLORIDE 0.9 % IV SOLN
1.0000 g | INTRAVENOUS | Status: DC
  Administered 2021-03-10: 1 g via INTRAVENOUS
  Filled 2021-03-10 (×2): qty 10

## 2021-03-10 NOTE — Plan of Care (Signed)

## 2021-03-10 NOTE — Progress Notes (Signed)
[facility] [place] NAME: Brianna Adams    MR#:  [id number]  DATE OF BIRTH:  Nov 17, 1940  SUBJECTIVE:   patient came in with increasing shortness of breath fever some cough Family art bedside denies chest pain, shortness of breath. No fever. REVIEW OF SYSTEMS:   Review of Systems  Constitutional:  Negative for chills, fever and weight loss.  HENT:  Negative for ear discharge, ear pain and nosebleeds.   Eyes:  Negative for blurred vision, pain and discharge.  Respiratory:  Positive for cough. Negative for sputum production, shortness of breath, wheezing and stridor.   Cardiovascular:  Negative for chest pain, palpitations, orthopnea and PND.  Gastrointestinal:  Negative for abdominal pain, diarrhea, nausea and vomiting.  Genitourinary:  Negative for frequency and urgency.  Musculoskeletal:  Negative for back pain and joint pain.  Neurological:  Positive for weakness. Negative for sensory change, speech change and focal weakness.  Psychiatric/Behavioral:  Negative for depression and hallucinations. The patient is not nervous/anxious.   Tolerating Diet:yes Tolerating PT: NO PT needs  DRUG ALLERGIES:   Allergies  Allergen Reactions   Ace Inhibitors Swelling   Lisinopril Swelling   Penicillin G Hives, Shortness Of Breath and Rash   Atorvastatin Other (See Comments)    Myalgias (Muscle Pain)    Cortisone Other (See Comments)    Unknown reaction   Hydrocortisone Other (See Comments)    Unknown reaction   Meloxicam Other (See Comments)    Bloody stools   Pravastatin Other (See Comments)    Chest Pain if high dosage     VITALS:  Blood pressure (!) 99/58, pulse 87, temperature 98 F (36.7 C), resp. rate 18, height _0  (1.575 m), weight 62.4 kg, SpO2 94 %.  PHYSICAL EXAMINATION:   Physical Exam  GENERAL:  80 y.o.-year-old patient lying in the bed with no acute distress.  LUNGS: Normal breath sounds bilaterally, no wheezing,  rales, rhonchi. No use of accessory muscles of respiration.  CARDIOVASCULAR: S1, S2 normal. No murmurs, rubs, or gallops.  ABDOMEN: Soft, nontender, nondistended. Bowel sounds present. No organomegaly or mass.  EXTREMITIES: No cyanosis, clubbing or edema b/l.    NEUROLOGIC: non-focal  PSYCHIATRIC:  patient is alert and oriented x 3.  SKIN: No obvious rash, lesion, or ulcer.   LABORATORY PANEL:  CBC Recent Labs  Lab 03/10/21 0620  WBC 10.4  HGB 10.7*  HCT 32.8*  PLT 226    Chemistries  Recent Labs  Lab 03/09/21 1137 03/10/21 0620  NA 132* 136  K 3.9 3.7  CL 96* 103  CO2 27 24  GLUCOSE 244* 149*  BUN 19 21  CREATININE 1.25* 0.91  CALCIUM 9.5 9.2  AST 20  --   ALT 14  --   ALKPHOS 53  --   BILITOT 0.9  --    Cardiac Enzymes No results for input(s): TROPONINI in the last 168 hours. RADIOLOGY:  DG Chest Portable 1 View  Result Date: 03/09/2021 CLINICAL DATA:  Shortness of breath.  History of lung cancer. EXAM: PORTABLE CHEST 1 VIEW COMPARISON:  12/09/20 FINDINGS: Stable cardiomediastinal contours. Diffuse, chronic reticular interstitial opacities are identified throughout both lungs compatible with chronic lung disease. No pleural effusion or edema. Asymmetric increased opacity within the right upper lobe may reflect superimposed pneumonia. Treated tumor within the left upper lobe appears unchanged from previous exam. IMPRESSION: Chronic lung disease with asymmetric increased opacity within the right upper lobe may reflect superimposed pneumonia.  Electronically Signed   By: Kerby Moors M.D.   On: 03/09/2021 11:39   ASSESSMENT AND PLAN:  Brianna Adams is a 80 y.o. 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  Right UL Pneumonia -- Patient has worsening shortness of breath --  Patient had fever last night and mild leukocytosis, clinically consistent with pneumonia. --  Chest x-ray showed possible infiltration in right upper lobe. Patient does not have rhonchi or wheezing, does not seem to have COPD exacerbation.  Patient does not meet criteria for sepsis.  - Mucinex for cough - Bronchodilators - BC negative --change to IV rocephin and po zithromax --WBC 10.4   Chronic respiratory failure with hypoxia and COPD: -Bronchodilators as above   Chronic diastolic CHF and pulmonary hypertension, unspecified ([place]): --2D echo on 12/10/2020 showed EF of 50 to 60% with grade 1 diastolic dysfunction.  Patient does not have weight gain recently, no leg edema or JVD.  CHF seem to be compensated. -Continue home Lasix 20 mg daily   GERD (gastroesophageal reflux disease) -Protonix   Pulmonary embolism (HCC) -Eliquis   CKD (chronic kidney disease), stage II:  --Slightly worsening.  Baseline creatinine 0.89 on 1/60/22.  Her creatinine is 1.25, BUN 19. -Hold Cozaar due to worsening renal function and softer blood pressure   HTN (hypertension): Blood pressure 105/56 -Hold Cozaar as above -Patient is on Lasix -IV hydralazine as needed   HLD (hyperlipidemia) -Pravastatin   Elevated troponin: Troponin is minimally elevated to 20, no chest pain, possibly due to demand ischemia or nonspecific change if not increase further -no cp   Type II diabetes mellitus with renal manifestations: Recent A1c 6.9, well controlled.  Patient is taking metformin and Jardiance. -Sliding scale insulin         DVT ppx: on Eliquis Code Status: Full code per pt and her daughter Family Communication: Yes, patient's  daughter  [name] on the phone Disposition Plan:  Anticipate discharge back to previous environment Consults called:  none Admission status and Level of care: Progressive Cardiac:    as  inpt       Status is: Inpatient   Remains inpatient appropriate because:Inpatient level of care appropriate due to severity of illness   Dispo: The patient is from: Home              Anticipated d/c is to: Home              Patient currently is not medically stable to d/c.              Difficult to place patient No  patient admitted for pneumonia. Overall improving. If continues to remain stable she can discharge tomorrow 03/11/21       TOTAL TIME TAKING CARE OF THIS PATIENT: 30 minutes.  >50% time spent on counselling and coordination of care  Note: This dictation was prepared with Dragon dictation along with smaller phrase technology. Any transcriptional errors that result from this process are unintentional.  Fritzi Mandes M.D    [facility]   CC: Primary care physician; [name], [name] Patient ID: Brianna Adams, female   DOB: 12-23-1940, 80 y.o.   MRN: [id number]

## 2021-03-10 NOTE — Evaluation (Signed)
Physical Therapy Evaluation Patient Details Name: Brianna Adams MRN: [id number] DOB: Jan 25, 1941 Today's Date: 03/10/2021   History of Present Illness  Pt is an 80 y.o. female presenting to hospital 7/4 with SOB.  Pt admitted with HCAP, chronic respiratory failure with hypoxia and COPD, chronic diastolic CHF, pulmonary htn, and CKD.  PMH includes DM, CHF, COPD, PE, 4 L home O2 baseline, lung CA, uterine CA, pulmonary htn.  Clinical Impression  Prior to hospital admission, pt was independent with functional mobility; uses 4 L home O2 baseline.  Currently pt is modified independent with bed mobility; modified independent with transfers; and SBA ambulating 120 feet (no AD use).  Pt's O2 sats 92% or greater on 4 L O2 via nasal cannula at rest (beginning/end of session) but decreased to 83-84% end of ambulation requiring a few minutes of sitting rest and pursed lip breathing to recover (nurse notified); HR 98 bpm at rest and increased up to 120 bpm with activity.  Pt would benefit from skilled PT to address noted impairments and functional limitations during hospitalization (see below for any additional details).  Upon hospital discharge, no further PT needs anticipated.    Follow Up Recommendations No PT follow up    Equipment Recommendations  None recommended by PT    Recommendations for Other Services       Precautions / Restrictions Precautions Precautions: None Restrictions Weight Bearing Restrictions: No      Mobility  Bed Mobility Overal bed mobility: Modified Independent             General bed mobility comments: HOB elevated; no difficulties noted    Transfers Overall transfer level: Modified independent Equipment used: None Transfers: Sit to/from Stand Sit to Stand: Modified independent (Device/Increase time)         General transfer comment: mild increased effort to stand from bed and from toilet but steady  Ambulation/Gait Ambulation/Gait assistance:  Supervision Gait Distance (Feet): 120 Feet Assistive device: None Gait Pattern/deviations: Step-through pattern Gait velocity: mildly decreased   General Gait Details: steady ambulation  Stairs            Wheelchair Mobility    Modified Rankin (Stroke Patients Only)       Balance Overall balance assessment: Needs assistance Sitting-balance support: No upper extremity supported;Feet supported Sitting balance-Leahy Scale: Normal Sitting balance - Comments: steady sitting reaching outside BOS   Standing balance support: No upper extremity supported;During functional activity Standing balance-Leahy Scale: Good Standing balance comment: no loss of balance with ambulation                             Pertinent Vitals/Pain Pain Assessment: No/denies pain    Home Living Family/patient expects to be discharged to:: Private residence Living Arrangements: Children Available Help at Discharge: Family Type of Home: House Home Access: Stairs to enter   [employer]ical [employer] of Steps: 2 with railing Home Layout: Two level;Able to live on main level with bedroom/bathroom        Prior Function Level of Independence: Independent         Comments: 4 L home O2 baseline; no recent falls     Hand Dominance        Extremity/Trunk Assessment   Upper Extremity Assessment Upper Extremity Assessment: Generalized weakness    Lower Extremity Assessment Lower Extremity Assessment: Generalized weakness    Cervical / Trunk Assessment Cervical / Trunk Assessment: Normal  Communication   Communication: No difficulties  Cognition Arousal/Alertness: Awake/alert Behavior During Therapy: WFL for tasks assessed/performed Overall Cognitive Status: Within Functional Limits for tasks assessed                                        General Comments  Nursing cleared pt for participation in physical therapy.  Pt agreeable to PT session.    Exercises      Assessment/Plan    PT Assessment Patient needs continued PT services  PT Problem List Decreased strength;Decreased activity tolerance;Decreased mobility;Cardiopulmonary status limiting activity       PT Treatment Interventions DME instruction;Gait training;Stair training;Functional mobility training;Therapeutic activities;Therapeutic exercise;Patient/family education    PT Goals (Current goals can be found in the Care Plan section)  Acute Rehab PT Goals Patient Stated Goal: to improve breathing PT Goal Formulation: With patient Time For Goal Achievement: 03/24/21 Potential to Achieve Goals: Fair    Frequency Min 2X/week   Barriers to discharge        Co-evaluation               AM-PAC PT "6 Clicks" Mobility  Outcome Measure Help needed turning from your back to your side while in a flat bed without using bedrails?: None Help needed moving from lying on your back to sitting on the side of a flat bed without using bedrails?: None Help needed moving to and from a bed to a chair (including a wheelchair)?: None Help needed standing up from a chair using your arms (e.g., wheelchair or bedside chair)?: None Help needed to walk in hospital room?: A Little Help needed climbing 3-5 steps with a railing? : A Little 6 Click Score: 22    End of Session Equipment Utilized During Treatment: Gait belt;Oxygen (4 L O2 via nasal cannula) Activity Tolerance: Other (comment) (limited by SOB with ambulation) Patient left: in bed;with call bell/phone within reach Nurse Communication: Mobility status;Precautions;Other (comment) (pt's O2 sats during sessions activities) PT Visit Diagnosis: Muscle weakness (generalized) (M62.81);Difficulty in walking, not elsewhere classified (R26.2)    Time: 0950-1009 PT Time Calculation (min) (ACUTE ONLY): 19 min   Charges:   PT Evaluation $PT Eval Low Complexity: 1 Low         Jillann Charette, PT 03/10/21, 10:25 AM

## 2021-03-10 NOTE — Evaluation (Signed)
Occupational Therapy Evaluation Patient Details Name: Brianna Adams MRN: [id number] DOB: [birth year] Today's Date: 03/10/2021    History of Present Illness Pt is an 80 y.o. female presenting to hospital 7/4 with SOB.  Pt admitted with HCAP, chronic respiratory failure with hypoxia and COPD, chronic diastolic CHF, pulmonary htn, and CKD.  PMH includes DM, CHF, COPD, PE, 4 L home O2 baseline, lung CA, uterine CA, pulmonary htn.   Clinical Impression   Ms Juran was seen for OT evaluation this date. Prior to hospital admission, pt was Independent in all aspects of ADL, using 4L O2 at baseline. Pt lives with her daughter and reports frequently attending [employer] classes. Upon arrival pt found with O2 removed from wall, SpO2 reading low 70s, resolved to 90% with 4L O2 donned. Pt completed ~100 ft mobility with SpO2 desat 86% during mobility, resolved to mid 90s with return to sitting. Pt demonstrates near baseline independence to perform dressing, grooming, and mobility tasks. No skilled OT needs identified. Will sign off. Please re-consult if additional OT needs arise.     Follow Up Recommendations  No OT follow up    Equipment Recommendations  None recommended by OT    Recommendations for Other Services       Precautions / Restrictions Precautions Precautions: None Restrictions Weight Bearing Restrictions: No      Mobility Bed Mobility Overal bed mobility: Modified Independent                  Transfers Overall transfer level: Modified independent Equipment used: None             General transfer comment: increased time and O2 tank, no assist for pt to use O2 tank    Balance Overall balance assessment: Needs assistance Sitting-balance support: No upper extremity supported;Feet supported Sitting balance-Leahy Scale: Normal Sitting balance - Comments: steady sitting reaching outside BOS   Standing balance support: No upper extremity supported;During functional  activity Standing balance-Leahy Scale: Good                             ADL either performed or assessed with clinical judgement   ADL Overall ADL's : Modified independent                                       General ADL Comments: Increased time for ADL t/f and standing grooming tasks using O2      Pertinent Vitals/Pain Pain Assessment: No/denies pain     Hand Dominance     Extremity/Trunk Assessment Upper Extremity Assessment Upper Extremity Assessment: Generalized weakness   Lower Extremity Assessment Lower Extremity Assessment: Generalized weakness       Communication Communication Communication: No difficulties   Cognition Arousal/Alertness: Awake/alert Behavior During Therapy: WFL for tasks assessed/performed Overall Cognitive Status: Within Functional Limits for tasks assessed                                     General Comments  upon arrival pt found with O2 removed from wall, SpO2 reading low 70s, resolved to 90% with 4L O2. Pt completed ~100 ft mobility with SpO2 desat 86% during mobility, resolved to mid 90s with return to sitting    Exercises Exercises: Other exercises Other Exercises Other Exercises: Pt and family educated re:  OT role, DME recs, d/c recs, falls prevention, ECS home/routines modifications Other Exercises: LBD, UBD, ~100 ft mobility, sup<>sit, sit<>stand, sitting/standing balance/tolerance   Shoulder Instructions      Home Living Family/patient expects to be discharged to:: Private residence Living Arrangements: Children Available Help at Discharge: Family Type of Home: House Home Access: Stairs to enter [employer] of Steps: 2 with railing   Home Layout: Two level;Able to live on main level with bedroom/bathroom               Home Equipment: Grab bars - toilet;Toilet riser          Prior Functioning/Environment Level of Independence: Independent        Comments:  4 L home O2 baseline; no recent falls        OT Problem List: Decreased activity tolerance         OT Goals(Current goals can be found in the care plan section) Acute Rehab OT Goals Patient Stated Goal: to improve breathing OT Goal Formulation: With patient Time For Goal Achievement: 03/24/21 Potential to Achieve Goals: Good   AM-PAC OT "6 Clicks" Daily Activity     Outcome Measure Help from another person eating meals?: None Help from another person taking care of personal grooming?: None Help from another person toileting, which includes using toliet, bedpan, or urinal?: None Help from another person bathing (including washing, rinsing, drying)?: None Help from another person to put on and taking off regular upper body clothing?: None Help from another person to put on and taking off regular lower body clothing?: None 6 Click Score: 24   End of Session Equipment Utilized During Treatment: Oxygen  Activity Tolerance: Patient tolerated treatment well Patient left: in bed;with call bell/phone within reach;with family/visitor present  OT Visit Diagnosis: Unsteadiness on feet (R26.81)                Time: 8295-6213 OT Time Calculation (min): 21 min Charges:  OT General Charges $OT Visit: 1 Visit OT Evaluation $OT Eval Low Complexity: 1 Low OT Treatments $Self Care/Home Management : 8-22 mins  Dessie Coma, M.S. OTR/L  03/10/21, 4:06 PM  ascom [phone]

## 2021-03-11 DIAGNOSIS — J9611 Chronic respiratory failure with hypoxia: Secondary | ICD-10-CM

## 2021-03-11 DIAGNOSIS — I5032 Chronic diastolic (congestive) heart failure: Secondary | ICD-10-CM

## 2021-03-11 DIAGNOSIS — J189 Pneumonia, unspecified organism: Principal | ICD-10-CM

## 2021-03-11 DIAGNOSIS — N182 Chronic kidney disease, stage 2 (mild): Secondary | ICD-10-CM

## 2021-03-11 DIAGNOSIS — E1169 Type 2 diabetes mellitus with other specified complication: Secondary | ICD-10-CM

## 2021-03-11 DIAGNOSIS — E785 Hyperlipidemia, unspecified: Secondary | ICD-10-CM

## 2021-03-11 DIAGNOSIS — K219 Gastro-esophageal reflux disease without esophagitis: Secondary | ICD-10-CM

## 2021-03-11 LAB — HEMOGLOBIN A1C
Hgb A1c MFr Bld: 6.5 % — ABNORMAL HIGH (ref 4.8–5.6)
Mean Plasma Glucose: 140 mg/dL

## 2021-03-11 LAB — LEGIONELLA PNEUMOPHILA SEROGP 1 UR AG: L. pneumophila Serogp 1 Ur Ag: NEGATIVE

## 2021-03-11 LAB — GLUCOSE, CAPILLARY
Glucose-Capillary: 136 mg/dL — ABNORMAL HIGH (ref 70–99)
Glucose-Capillary: 102 mg/dL — ABNORMAL HIGH (ref 70–99)

## 2021-03-11 MED ORDER — CEFDINIR 300 MG PO CAPS: 300.0000 mg | ORAL_CAPSULE | Freq: Two times a day (BID) | ORAL | 0 refills | Status: AC

## 2021-03-11 MED ORDER — AZITHROMYCIN 250 MG PO TABS
ORAL_TABLET | ORAL | 0 refills | Status: DC
Start: 2021-03-12 — End: 2021-03-23

## 2021-03-11 NOTE — Plan of Care (Signed)

## 2021-03-11 NOTE — Discharge Instructions (Signed)
At cxr 6 weeks

## 2021-03-11 NOTE — Discharge Summary (Signed)
[facility] NAME: Brianna Adams    MR#:  [id number]  DATE OF BIRTH:  01/08/1941  DATE OF ADMISSION:  03/09/2021 ADMITTING PHYSICIAN: Ivor Costa, MD  DATE OF DISCHARGE: 03/11/2021  3:13 PM  PRIMARY CARE PHYSICIAN: [name], [name]    ADMISSION DIAGNOSIS:  HCAP (healthcare-associated pneumonia) [J18.9]  DISCHARGE DIAGNOSIS:  Principal Problem:   HCAP (healthcare-associated pneumonia) Active Problems:   Pulmonary hypertension, unspecified ([place])   Chronic respiratory failure with hypoxia (HCC)   GERD (gastroesophageal reflux disease)   Pulmonary embolism (HCC)   CKD (chronic kidney disease), stage II   Chronic diastolic CHF (congestive heart failure) (HCC)   HTN (hypertension)   HLD (hyperlipidemia)   COPD (chronic obstructive pulmonary disease) (HCC)   Elevated troponin   Type II diabetes mellitus with renal manifestations ([place])   SECONDARY DIAGNOSIS:   Past Medical History:  Diagnosis Date   CHF (congestive heart failure) (HCC)    COPD (chronic obstructive pulmonary disease) (HCC)    Diabetes ([place])    type 2   Diverticulitis    Dyspnea    On Oxygen 3L via [place]    GERD (gastroesophageal reflux disease)    High cholesterol    Lung cancer ([place])    PE (pulmonary thromboembolism) ([place]) 02/29/2020   Pulmonary hypertension ([place])    Uterine cancer ([place])     HOSPITAL COURSE:   Right upper lobe pneumonia.  Patient initially was on vancomycin and cefepime.  Switched over to Rocephin and Zithromax.  Patient feeling better and wanted to go home and I will switch her over to [place] (7 more doses) and Zithromax (3 more doses) orally to complete a course.  Recommend repeat chest x-ray in 6 weeks to ensure clearing. Chronic hypoxic respiratory failure on 4 L of oxygen.  Patient on baseline oxygen continue inhalers and nebulizers at home. Chronic diastolic congestive heart failure on low-dose Lasix.  Blood pressure little too low for  any other medications.  Patient euvolemic.  No signs of heart failure on this hospital stay. GERD on Protonix History of pulmonary embolism on Eliquis Chronic kidney disease stage II.  Creatinine was 1.25 on presentation down to 0.91.  GFR is greater than 60 upon disposition. Type 2 diabetes mellitus with hyperlipidemia on pravastatin.  Can go back on metformin and Jardiance as home  DISCHARGE CONDITIONS:   Satisfactory  CONSULTS OBTAINED:    None  DRUG ALLERGIES:   Allergies  Allergen Reactions   Ace Inhibitors Swelling   Lisinopril Swelling   Penicillin G Hives, Shortness Of Breath and Rash   Atorvastatin Other (See Comments)    Myalgias (Muscle Pain)    Cortisone Other (See Comments)    Unknown reaction   Hydrocortisone Other (See Comments)    Unknown reaction   Meloxicam Other (See Comments)    Bloody stools   Pravastatin Other (See Comments)    Chest Pain if high dosage     DISCHARGE MEDICATIONS:   Allergies as of 03/11/2021       Reactions   Ace Inhibitors Swelling   Lisinopril Swelling   Penicillin G Hives, Shortness Of Breath, Rash   Atorvastatin Other (See Comments)   Myalgias (Muscle Pain)   Cortisone Other (See Comments)   Unknown reaction   Hydrocortisone Other (See Comments)   Unknown reaction   Meloxicam Other (See Comments)   Bloody stools   Pravastatin Other (See Comments)   Chest Pain if high dosage  Medication List     STOP taking these medications    calcium carbonate 1500 (600 Ca) MG Tabs tablet Commonly known as: OSCAL   dextromethorphan-guaiFENesin 10-100 MG/5ML liquid Commonly known as: ROBITUSSIN-DM   losartan 25 MG tablet Commonly known as: COZAAR       TAKE these medications    acetaminophen 500 MG tablet Commonly known as: TYLENOL Take 1,000 mg by mouth every 6 (six) hours as needed for mild pain or fever.   azithromycin 250 MG tablet Commonly known as: ZITHROMAX One tab po daily for three days Start  taking on: March 12, 2021   cefdinir 300 MG capsule Commonly known as: OMNICEF Take 1 capsule (300 mg total) by mouth 2 (two) times daily for 7 doses.   Cinnamon 500 MG Tabs Take 1,000 mg by mouth daily.   Eliquis 5 MG Tabs tablet Generic drug: apixaban TAKE 1 TABLET BY MOUTH TWICE A DAY   empagliflozin 10 MG Tabs tablet Commonly known as: Jardiance Take 1 tablet (10 mg total) by mouth daily before breakfast.   furosemide 20 MG tablet Commonly known as: LASIX TAKE 1 TABLET BY MOUTH EVERY DAY   metFORMIN 1000 MG tablet Commonly known as: GLUCOPHAGE TAKE 1 TABLET (1,000 MG TOTAL) BY MOUTH 2 (TWO) TIMES DAILY WITH A MEAL.   MUCINEX D PO Take by mouth. nightly   OCUVITE EYE HEALTH FORMULA PO Take 1 capsule by mouth daily.   CENTRUM SILVER 50+WOMEN PO Take 1 tablet by mouth daily.   omeprazole 40 MG capsule Commonly known as: PRILOSEC TAKE 1 CAPSULE BY MOUTH EVERY DAY   pravastatin 10 MG tablet Commonly known as: PRAVACHOL TAKE 1 TABLET BY MOUTH EVERY DAY   Trelegy Ellipta 100-62.5-25 MCG/INH Aepb Generic drug: Fluticasone-Umeclidin-Vilant Inhale 100 mcg into the lungs daily.   vitamin C 500 MG tablet Commonly known as: ASCORBIC ACID Take 500 mg by mouth every other day.   vitamin E 1000 UNIT capsule Take 1,000 Units by mouth every Monday, Wednesday, and Friday at 6 PM.         DISCHARGE INSTRUCTIONS:   Follow-up PMD 5 days  If you experience worsening of your admission symptoms, develop shortness of breath, life threatening emergency, suicidal or homicidal thoughts you must seek medical attention immediately by calling 911 or calling your MD immediately  if symptoms less severe.  You Must read complete instructions/literature along with all the possible adverse reactions/side effects for all the Medicines you take and that have been prescribed to you. Take any new Medicines after you have completely understood and accept all the possible adverse  reactions/side effects.   Please note  You were cared for by a hospitalist during your hospital stay. If you have any questions about your discharge medications or the care you received while you were in the hospital after you are discharged, you can call the unit and asked to speak with the hospitalist on call if the hospitalist that took care of you is not available. Once you are discharged, your primary care physician will handle any further medical issues. Please note that NO REFILLS for any discharge medications will be authorized once you are discharged, as it is imperative that you return to your primary care physician (or establish a relationship with a primary care physician if you do not have one) for your aftercare needs so that they can reassess your need for medications and monitor your lab values.    Today   CHIEF COMPLAINT:   Chief  Complaint  Patient presents with   Shortness of Breath    HISTORY OF PRESENT ILLNESS:  Brianna Adams  is a 80 y.o. female with a known history of came in with shortness of breath and found to have pneumonia.   VITAL SIGNS:  Blood pressure (!) 106/59, pulse 78, temperature 98.2 F (36.8 C), temperature source Oral, resp. rate 18, height _0  (1.575 m), weight 62.8 kg, SpO2 97 %.  I/O:   Intake/Output Summary (Last 24 hours) at 03/11/2021 1806 Last data filed at 03/11/2021 1033 Gross per 24 hour  Intake 820 ml  Output 425 ml  Net 395 ml    PHYSICAL EXAMINATION:  GENERAL:  80 y.o.-year-old patient lying in the bed with no acute distress.  EYES: Pupils equal, round, reactive to light and accommodation. No scleral icterus. Extraocular muscles intact.  HEENT: Head atraumatic, normocephalic. Oropharynx and nasopharynx clear.  LUNGS: Normal breath sounds bilaterally, no wheezing, rales,rhonchi or crepitation. No use of accessory muscles of respiration.  CARDIOVASCULAR: S1, S2 normal. No murmurs, rubs, or gallops.  ABDOMEN: Soft, non-tender,  non-distended.  EXTREMITIES: No pedal edema.  NEUROLOGIC: Cranial nerves II through XII are intact. Muscle strength 5/5 in all extremities. Sensation intact. Gait not checked.  PSYCHIATRIC: The patient is alert and oriented x 3.  SKIN: No obvious rash, lesion, or ulcer.   DATA REVIEW:   CBC Recent Labs  Lab 03/10/21 0620  WBC 10.4  HGB 10.7*  HCT 32.8*  PLT 226    Chemistries  Recent Labs  Lab 03/09/21 1137 03/10/21 0620  NA 132* 136  K 3.9 3.7  CL 96* 103  CO2 27 24  GLUCOSE 244* 149*  BUN 19 21  CREATININE 1.25* 0.91  CALCIUM 9.5 9.2  AST 20  --   ALT 14  --   ALKPHOS 53  --   BILITOT 0.9  --      Microbiology Results  Results for orders placed or performed during the hospital encounter of 03/09/21  Resp Panel by RT-PCR (Flu A&B, Covid) Nasopharyngeal Swab     Status: None   Collection Time: 03/09/21 11:37 AM   Specimen: Nasopharyngeal Swab; Nasopharyngeal(NP) swabs in vial transport medium  Result Value Ref Range Status   SARS Coronavirus 2 by RT PCR NEGATIVE NEGATIVE Final    Comment: (NOTE) SARS-CoV-2 target nucleic acids are NOT DETECTED.  The SARS-CoV-2 RNA is generally detectable in upper respiratory specimens during the acute phase of infection. The lowest concentration of SARS-CoV-2 viral copies this assay can detect is 138 copies/mL. A negative result does not preclude SARS-Cov-2 infection and should not be used as the sole basis for treatment or other patient management decisions. A negative result may occur with  improper specimen collection/handling, submission of specimen other than nasopharyngeal swab, presence of viral mutation(s) within the areas targeted by this assay, and inadequate number of viral copies(<138 copies/mL). A negative result must be combined with clinical observations, patient history, and epidemiological information. The expected result is Negative.  Fact Sheet for Patients:   EntrepreneurPulse.com.au  Fact Sheet for Healthcare Providers:  IncredibleEmployment.be  This test is no t yet approved or cleared by the [place] FDA and  has been authorized for detection and/or diagnosis of SARS-CoV-2 by FDA under an Emergency Use Authorization (EUA). This EUA will remain  in effect (meaning this test can be used) for the duration of the COVID-19 declaration under Section 564(b)(1) of the Act, 21 U.S.C.section 360bbb-3(b)(1), unless the authorization is terminated  or revoked sooner.       Influenza A by PCR NEGATIVE NEGATIVE Final   Influenza B by PCR NEGATIVE NEGATIVE Final    Comment: (NOTE) The Xpert Xpress SARS-CoV-2/FLU/RSV plus assay is intended as an aid in the diagnosis of influenza from Nasopharyngeal swab specimens and should not be used as a sole basis for treatment. Nasal washings and aspirates are unacceptable for Xpert Xpress SARS-CoV-2/FLU/RSV testing.  Fact Sheet for Patients: EntrepreneurPulse.com.au  Fact Sheet for Healthcare Providers: IncredibleEmployment.be  This test is not yet approved or cleared by the [place] FDA and has been authorized for detection and/or diagnosis of SARS-CoV-2 by FDA under an Emergency Use Authorization (EUA). This EUA will remain in effect (meaning this test can be used) for the duration of the COVID-19 declaration under Section 564(b)(1) of the Act, 21 U.S.C. section 360bbb-3(b)(1), unless the authorization is terminated or revoked.  Performed at [facility], [address] [place], [place] [address]   CULTURE, BLOOD (ROUTINE X 2) w Reflex to ID Panel     Status: None (Preliminary result)   Collection Time: 03/09/21  2:20 PM   Specimen: BLOOD  Result Value Ref Range Status   Specimen Description BLOOD [facility]  Final   Special Requests   Final    BOTTLES DRAWN AEROBIC AND ANAEROBIC Blood Culture adequate volume    Culture   Final    NO GROWTH 2 DAYS Performed at [facility], [address], [place], [place] [address]    Report Status PENDING  Incomplete  CULTURE, BLOOD (ROUTINE X 2) w Reflex to ID Panel     Status: None (Preliminary result)   Collection Time: 03/09/21  2:31 PM   Specimen: BLOOD  Result Value Ref Range Status   Specimen Description BLOOD [facility]  Final   Special Requests   Final    BOTTLES DRAWN AEROBIC AND ANAEROBIC Blood Culture adequate volume   Culture   Final    NO GROWTH 2 DAYS Performed at [facility], [address] [place], [place] [address]    Report Status PENDING  Incomplete  MRSA Next Gen by PCR, Nasal     Status: None   Collection Time: 03/09/21  5:10 PM   Specimen: Nasal Mucosa; Nasal Swab  Result Value Ref Range Status   MRSA by PCR Next Gen NOT DETECTED NOT DETECTED Final    Comment: (NOTE) The GeneXpert MRSA Assay (FDA approved for NASAL specimens only), is one component of a comprehensive MRSA colonization surveillance program. It is not intended to diagnose MRSA infection nor to guide or monitor treatment for MRSA infections. Test performance is not FDA approved in patients less than 61 years old. Performed at [facility], [address] [place], [place] [address]       Management plans discussed with the patient, family and they are in agreement.  CODE STATUS:     Code Status Orders  (From admission, onward)           Start     Ordered   03/09/21 1608  Full code  Continuous        03/09/21 1608           Code Status History     Date Active Date Inactive Code Status Order ID Comments User Context   12/09/2020 1844 12/10/2020 1952 Full Code [id number]  Kathie Dike, MD ED   02/29/2020 1640 03/03/2020 1948 Full Code [id number]  Flora Lipps, MD ED  TOTAL TIME TAKING CARE OF THIS PATIENT: 35 minutes.    Loletha Grayer M.D on 03/11/2021 at 6:06 PM   [facility]  CC: Primary care  physician; [name], [name]

## 2021-03-12 ENCOUNTER — Ambulatory Visit: Payer: Medicare Other | Admitting: Cardiovascular Disease

## 2021-03-12 ENCOUNTER — Telehealth: Payer: Self-pay

## 2021-03-12 NOTE — Telephone Encounter (Signed)
Transition Care Management Follow-up Telephone Call Date of discharge and from where: 02/10/2021 [facility]  How have you been since you were released from the hospital? Pt is Any questions or concerns? No  Items Reviewed: Did the pt receive and understand the discharge instructions provided? Yes  Medications obtained and verified? Yes  Other? No  Any new allergies since your discharge? No  Dietary orders reviewed? Yes Do you have support at home? Yes   Home Care and Equipment/Supplies: Were home health services ordered? not applicable If so, what is the name of the agency? N/a   Has the agency set up a time to come to the patient's home? not applicable Were any new equipment or medical supplies ordered?  No What is the name of the medical supply agency? N/a  Were you able to get the supplies/equipment? not applicable Do you have any questions related to the use of the equipment or supplies? No  Functional Questionnaire: (I = Independent and D = Dependent) ADLs: I  Bathing/Dressing- I  Meal Prep- I  Eating- I  Maintaining continence- I  Transferring/Ambulation- I  Managing Meds- I  Follow up appointments reviewed:  PCP Hospital f/u appt confirmed? Yes  Scheduled to see Brianna Adams on 03/23/2021 @  [facility] 4:0pm. [facility] f/u appt confirmed?  N/a   Scheduled to see n/a on n/a  @ n/a. Are transportation arrangements needed? No  If their condition worsens, is the pt aware to call PCP or go to the Emergency Dept.? Yes Was the patient provided with contact information for the PCP's office or ED? Yes Was to pt encouraged to call back with questions or concerns? Yes

## 2021-03-14 LAB — CULTURE, BLOOD (ROUTINE X 2)
Culture: NO GROWTH
Culture: NO GROWTH
Special Requests: ADEQUATE
Special Requests: ADEQUATE

## 2021-03-17 ENCOUNTER — Other Ambulatory Visit: Payer: Self-pay | Admitting: Nurse Practitioner

## 2021-03-17 DIAGNOSIS — R918 Other nonspecific abnormal finding of lung field: Secondary | ICD-10-CM

## 2021-03-23 ENCOUNTER — Encounter: Payer: Self-pay | Admitting: Nurse Practitioner

## 2021-03-23 ENCOUNTER — Ambulatory Visit (INDEPENDENT_AMBULATORY_CARE_PROVIDER_SITE_OTHER): Payer: Medicare Other | Admitting: Nurse Practitioner

## 2021-03-23 ENCOUNTER — Other Ambulatory Visit: Payer: Self-pay

## 2021-03-23 VITALS — BP 110/58 | HR 94 | Temp 99.0°F | Ht 62.0 in | Wt 136.6 lb

## 2021-03-23 DIAGNOSIS — I5032 Chronic diastolic (congestive) heart failure: Secondary | ICD-10-CM | POA: Diagnosis not present

## 2021-03-23 DIAGNOSIS — J189 Pneumonia, unspecified organism: Secondary | ICD-10-CM

## 2021-03-23 NOTE — Progress Notes (Signed)
I,[name],acting as a Education administrator for [employer], [name].,have documented all relevant documentation on the behalf of [name], [name],as directed by  [name], [name] while in the presence of [name], [place].  This visit occurred during the SARS-CoV-2 public health emergency.  Safety protocols were in place, including screening questions prior to the visit, additional usage of staff PPE, and extensive cleaning of exam room while observing appropriate contact time as indicated for disinfecting solutions.  Subjective:     Patient ID: Brianna Adams , female    DOB: 1940-10-21 , 80 y.o.   MRN: [id number]   Chief Complaint  Patient presents with   Hospitalization Follow-up    HPI  Patient is here for hospital follow up.  She was admitted to the hospital on 7/4 - 7/5 after being lethargic and not eating and her daughter took her to the ER and was diagnosed with pneumonia. She was initially treated with vancomycin and cefepime, she was switched to rocephin and zithromax. At discharge she was switched to [facility] for 7 more doses and continue zithromax 3 doses.  She is to have a repeat CXR in 6 weeks. She continues on 4 l/m oxygen due to chronic respiratory failure. She is feeling better.    She also has Chronic diastolic congestive heart failure on low-dose Lasix her blood pressure was a too low for additional medicaitons. No active heart failure during her hospitalizeation.   She will be out of town for one week. Does not feel she needs Home Health at this time   BP Readings from Last 3 Encounters: 03/23/21 : (!) 110/58 03/11/21 : (!) 106/59 03/03/21 : 132/70      Past Medical History:  Diagnosis Date   CHF (congestive heart failure) (HCC)    COPD (chronic obstructive pulmonary disease) (HCC)    Diabetes ([place])    type 2   Diverticulitis    Dyspnea    On Oxygen 3L via [place]    GERD (gastroesophageal reflux disease)    High cholesterol    Lung cancer ([place])    PE (pulmonary  thromboembolism) ([place]) 02/29/2020   Pulmonary hypertension ([place])    Uterine cancer ([place])      Family History  Problem Relation Age of Onset   Hypertension Mother    Heart attack Father    Cancer Maternal Aunt      Current Outpatient Medications:    acetaminophen (TYLENOL) 500 MG tablet, Take 1,000 mg by mouth every 6 (six) hours as needed for mild pain or fever. , Disp: , Rfl:    Cinnamon 500 MG TABS, Take 1,000 mg by mouth daily. , Disp: , Rfl:    ELIQUIS 5 MG TABS tablet, TAKE 1 TABLET BY MOUTH TWICE A DAY, Disp: 60 tablet, Rfl: 2   empagliflozin (JARDIANCE) 10 MG TABS tablet, Take 1 tablet (10 mg total) by mouth daily before breakfast., Disp: 30 tablet, Rfl: 3   Fluticasone-Umeclidin-Vilant (TRELEGY ELLIPTA) 100-62.5-25 MCG/INH AEPB, Inhale 100 mcg into the lungs daily., Disp: 60 each, Rfl: 5   furosemide (LASIX) 20 MG tablet, TAKE 1 TABLET BY MOUTH EVERY DAY, Disp: 90 tablet, Rfl: 2   metFORMIN (GLUCOPHAGE) 1000 MG tablet, TAKE 1 TABLET (1,000 MG TOTAL) BY MOUTH 2 (TWO) TIMES DAILY WITH A MEAL., Disp: 180 tablet, Rfl: 0   Multiple Vitamins-Minerals (CENTRUM SILVER 50+WOMEN PO), Take 1 tablet by mouth daily., Disp: , Rfl:    Multiple Vitamins-Minerals (OCUVITE EYE HEALTH FORMULA PO), Take 1 capsule by mouth daily.,  Disp: , Rfl:    omeprazole (PRILOSEC) 40 MG capsule, TAKE 1 CAPSULE BY MOUTH EVERY DAY, Disp: 90 capsule, Rfl: 1   pravastatin (PRAVACHOL) 10 MG tablet, TAKE 1 TABLET BY MOUTH EVERY DAY, Disp: 90 tablet, Rfl: 1   Pseudoephedrine-guaiFENesin (MUCINEX D PO), Take by mouth. nightly, Disp: , Rfl:    vitamin C (ASCORBIC ACID) 500 MG tablet, Take 500 mg by mouth every other day., Disp: , Rfl:    vitamin E 1000 UNIT capsule, Take 1,000 Units by mouth every Monday, Wednesday, and Friday at 6 PM., Disp: , Rfl:    Allergies  Allergen Reactions   Ace Inhibitors Swelling   Lisinopril Swelling   Penicillin G Hives, Shortness Of Breath and Rash   Atorvastatin Other (See Comments)     Myalgias (Muscle Pain)    Cortisone Other (See Comments)    Unknown reaction   Hydrocortisone Other (See Comments)    Unknown reaction   Meloxicam Other (See Comments)    Bloody stools   Pravastatin Other (See Comments)    Chest Pain if high dosage      Review of Systems  Constitutional: Negative.   Respiratory: Negative.    Cardiovascular: Negative.   Gastrointestinal: Negative.   Neurological: Negative.     Today's Vitals   03/23/21 1607  BP: (!) 110/58  Pulse: 94  Temp: 99 F (37.2 C)  TempSrc: Oral  Weight: 136 lb 9.6 oz (62 kg)  Height: 5' 2" (1.575 m)   Body mass index is 24.98 kg/m.  Wt Readings from Last 3 Encounters:  03/23/21 136 lb 9.6 oz (62 kg)  03/11/21 138 lb 7.2 oz (62.8 kg)  03/03/21 139 lb 12.8 oz (63.4 kg)    Objective:  Physical Exam Vitals reviewed.  Constitutional:      General: She is not in acute distress.    Appearance: Normal appearance. She is obese.  Cardiovascular:     Rate and Rhythm: Normal rate and regular rhythm.     Pulses: Normal pulses.     Heart sounds: Normal heart sounds. No murmur heard. Pulmonary:     Effort: Pulmonary effort is normal. No respiratory distress.     Breath sounds: No wheezing.     Comments: Wearing 4 l/m oxygen via [place]:    Capillary Refill: Capillary refill takes less than 2 seconds.  Neurological:     General: No focal deficit present.     Mental Status: She is alert and oriented to person, place, and time.     Cranial Nerves: No cranial nerve deficit.     Motor: No weakness.  Psychiatric:        Mood and Affect: Mood normal.        Behavior: Behavior normal.        Thought Content: Thought content normal.        Judgment: Judgment normal.        Assessment And Plan:     1. Pneumonia of right upper lobe due to infectious organism Doing better with antibiotic therapy, will order f/u CXR in 6 weeks TCM Performed. A member of the clinical team spoke with the patient upon dischare.  Discharge summary was reviewed in full detail during the visit. Meds reconciled and compared to discharge meds. Medication list is updated and reviewed with the patient.  Greater than 50% face to face time was spent in counseling an coordination of care.  All questions were answered to the satisfaction of the patient.    -  DG Chest 2 View; Future  2. Chronic diastolic CHF (congestive heart failure) (HCC) No current exacerbations, continue diuretic as ordered   Patient was given opportunity to ask questions. Patient verbalized understanding of the plan and was able to repeat key elements of the plan. All questions were answered to their satisfaction.  [name], [name]   I, [name], [name], have reviewed all documentation for this visit. The documentation on 03/23/21 for the exam, diagnosis, procedures, and orders are all accurate and complete.   IF YOU HAVE BEEN REFERRED TO A SPECIALIST, IT MAY TAKE 1-2 WEEKS TO SCHEDULE/PROCESS THE REFERRAL. IF YOU HAVE NOT HEARD FROM US/SPECIALIST IN TWO WEEKS, PLEASE GIVE [place] A CALL AT [phone].   THE PATIENT IS ENCOURAGED TO PRACTICE SOCIAL DISTANCING DUE TO THE COVID-19 PANDEMIC.

## 2021-03-27 ENCOUNTER — Ambulatory Visit
Admission: RE | Admit: 2021-03-27 | Discharge: 2021-03-27 | Disposition: A | Discharge status: Home / Self Care | Payer: Medicare Other | Source: Ambulatory Visit | Attending: Nurse Practitioner | Admitting: Nurse Practitioner

## 2021-03-27 DIAGNOSIS — R918 Other nonspecific abnormal finding of lung field: Secondary | ICD-10-CM

## 2021-04-13 ENCOUNTER — Ambulatory Visit
Admission: RE | Admit: 2021-04-13 | Discharge: 2021-04-13 | Disposition: A | Discharge status: Home / Self Care | Payer: Medicare Other | Source: Ambulatory Visit | Attending: Nurse Practitioner | Admitting: Nurse Practitioner

## 2021-04-13 DIAGNOSIS — J189 Pneumonia, unspecified organism: Secondary | ICD-10-CM

## 2021-04-15 ENCOUNTER — Other Ambulatory Visit: Payer: Self-pay | Admitting: Nurse Practitioner

## 2021-04-15 ENCOUNTER — Ambulatory Visit: Payer: Medicare Other | Admitting: Cardiology

## 2021-04-18 ENCOUNTER — Other Ambulatory Visit (HOSPITAL_COMMUNITY): Payer: Self-pay | Admitting: Internal Medicine

## 2021-04-27 ENCOUNTER — Encounter: Payer: Self-pay | Admitting: Nurse Practitioner

## 2021-04-29 ENCOUNTER — Other Ambulatory Visit (HOSPITAL_COMMUNITY): Payer: Self-pay | Admitting: Internal Medicine

## 2021-04-30 ENCOUNTER — Other Ambulatory Visit: Payer: Self-pay

## 2021-04-30 MED ORDER — EMPAGLIFLOZIN 10 MG PO TABS
10.0000 mg | ORAL_TABLET | Freq: Every day | ORAL | 3 refills | Status: DC
Start: 2021-04-30 — End: 2021-09-01

## 2021-05-16 ENCOUNTER — Other Ambulatory Visit: Payer: Self-pay | Admitting: Nurse Practitioner

## 2021-05-24 ENCOUNTER — Other Ambulatory Visit: Payer: Self-pay

## 2021-05-24 ENCOUNTER — Emergency Department (HOSPITAL_COMMUNITY)
Admission: EM | Admit: 2021-05-24 | Discharge: 2021-05-24 | Disposition: A | Discharge status: Home / Self Care | Payer: Medicare Other | Attending: Emergency Medicine | Admitting: Emergency Medicine

## 2021-05-24 ENCOUNTER — Encounter (HOSPITAL_COMMUNITY): Payer: Self-pay

## 2021-05-24 DIAGNOSIS — J449 Chronic obstructive pulmonary disease, unspecified: Secondary | ICD-10-CM | POA: Diagnosis not present

## 2021-05-24 DIAGNOSIS — I5032 Chronic diastolic (congestive) heart failure: Secondary | ICD-10-CM | POA: Insufficient documentation

## 2021-05-24 DIAGNOSIS — Z79899 Other long term (current) drug therapy: Secondary | ICD-10-CM | POA: Diagnosis not present

## 2021-05-24 DIAGNOSIS — Z87891 Personal history of nicotine dependence: Secondary | ICD-10-CM | POA: Insufficient documentation

## 2021-05-24 DIAGNOSIS — Z7901 Long term (current) use of anticoagulants: Secondary | ICD-10-CM | POA: Insufficient documentation

## 2021-05-24 DIAGNOSIS — N182 Chronic kidney disease, stage 2 (mild): Secondary | ICD-10-CM | POA: Insufficient documentation

## 2021-05-24 DIAGNOSIS — I130 Hypertensive heart and chronic kidney disease with heart failure and stage 1 through stage 4 chronic kidney disease, or unspecified chronic kidney disease: Secondary | ICD-10-CM | POA: Diagnosis not present

## 2021-05-24 DIAGNOSIS — K625 Hemorrhage of anus and rectum: Secondary | ICD-10-CM | POA: Diagnosis not present

## 2021-05-24 DIAGNOSIS — Z7984 Long term (current) use of oral hypoglycemic drugs: Secondary | ICD-10-CM | POA: Diagnosis not present

## 2021-05-24 LAB — CBC
HCT: 36 % (ref 36.0–46.0)
Hemoglobin: 11.2 g/dL — ABNORMAL LOW (ref 12.0–15.0)
MCH: 27.9 pg (ref 26.0–34.0)
MCHC: 31.1 g/dL (ref 30.0–36.0)
MCV: 89.8 fL (ref 80.0–100.0)
Platelets: 272 10*3/uL (ref 150–400)
RBC: 4.01 MIL/uL (ref 3.87–5.11)
RDW: 16.9 % — ABNORMAL HIGH (ref 11.5–15.5)
WBC: 7.4 10*3/uL (ref 4.0–10.5)
nRBC: 0 % (ref 0.0–0.2)

## 2021-05-24 LAB — COMPREHENSIVE METABOLIC PANEL
ALT: 12 U/L (ref 0–44)
AST: 18 U/L (ref 15–41)
Albumin: 3.7 g/dL (ref 3.5–5.0)
Alkaline Phosphatase: 45 U/L (ref 38–126)
Anion gap: 9 (ref 5–15)
BUN: 19 mg/dL (ref 8–23)
CO2: 26 mmol/L (ref 22–32)
Calcium: 9.6 mg/dL (ref 8.9–10.3)
Chloride: 101 mmol/L (ref 98–111)
Creatinine, Ser: 0.86 mg/dL (ref 0.44–1.00)
GFR, Estimated: >60 mL/min (ref >60)
Glucose, Bld: 141 mg/dL — ABNORMAL HIGH (ref 70–99)
Potassium: 4.4 mmol/L (ref 3.5–5.1)
Sodium: 136 mmol/L (ref 135–145)
Total Bilirubin: 0.5 mg/dL (ref 0.3–1.2)
Total Protein: 7.1 g/dL (ref 6.5–8.1)

## 2021-05-24 LAB — HEPATIC FUNCTION PANEL
ALT: 12 U/L (ref 0–44)
AST: 13 U/L — ABNORMAL LOW (ref 15–41)
Albumin: 3.5 g/dL (ref 3.5–5.0)
Alkaline Phosphatase: 46 U/L (ref 38–126)
Bilirubin, Direct: <0.1 mg/dL (ref 0.0–0.2)
Total Bilirubin: 0.6 mg/dL (ref 0.3–1.2)
Total Protein: 6.9 g/dL (ref 6.5–8.1)

## 2021-05-24 LAB — APTT: aPTT: 27 seconds (ref 24–36)

## 2021-05-24 LAB — PROTIME-INR
INR: 1.1 (ref 0.8–1.2)
Prothrombin Time: 14.4 seconds (ref 11.4–15.2)

## 2021-05-24 LAB — TYPE AND SCREEN
ABO/RH(D): AB POS
Antibody Screen: NEGATIVE

## 2021-05-24 NOTE — ED Provider Notes (Signed)
[facility] EMERGENCY DEPARTMENT Provider Note   CSN: [id number] Arrival date & time: 05/24/21  5597     History No chief complaint on file.   Brianna Adams is a 80 y.o. female.  Pt is a 80 yo female on eliquis 5 mg bid for PE dx in July of 2022 presenting for vaginal and rectal bleeding. Pt admits to bright red blood in stool that started last night with 5-6 bloody BM's. Admits to generalized abdominal pain that occurs prior to BM. Denies rectal pain. States vaginal bleeding started last night as well, seen while wiping, not enough to saturate pad. Denies blood clots. Denies light headedness, dizziness, or syncope. Admits to fatigue.   The history is provided by the patient. No language interpreter was used.      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   Chronic diastolic CHF (congestive heart failure) ([place]) 03/09/2021   HTN (hypertension) 03/09/2021   HLD (hyperlipidemia) 03/09/2021   COPD (chronic obstructive pulmonary disease) ([place]) 03/09/2021   Elevated troponin 03/09/2021   Type 2 diabetes mellitus with hyperlipidemia ([place]) 03/09/2021   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   CHF exacerbation (HCC) 41/63/8453   Acute diastolic CHF (congestive heart failure) (HCC)    Lymphadenopathy    Pulmonary embolism ([place]) 02/29/2020   Chronic respiratory failure with hypoxia ([place]) 02/18/2020   Pulmonary nodule 02/18/2020    GERD (gastroesophageal reflux disease) 02/18/2020   Pulmonary hypertension, unspecified ([place]) 10/04/2019   COPD with chronic bronchitis and emphysema ([place]) 03/14/2019   Type 2 diabetes mellitus without complication, without long-term current use of insulin ([place]) 03/14/2019    Past Surgical History:  Procedure Laterality Date   ABDOMINAL HYSTERECTOMY     cataract surgery  2012 and 2017   COLONOSCOPY     FIBEROPTIC BRONCHOSCOPY     GANGLION CYST EXCISION Left 09/11/2020   Procedure: EXCISION VOLAR RADIAL GANGLION OF LEFT WRIST;  Surgeon: Daryll Brod, MD;  Location: [place];  Service: Orthopedics;  Laterality: Left;  AXILLARY BLOCK   resection of lung cancer  2018   right lower lobe non-anatomocal lung resection wedge     right thorascoscopy       OB History   No obstetric history on file.     Family History  Problem Relation Age of Onset   Hypertension Mother    Heart attack Father    Cancer Maternal Aunt     Social History   Tobacco Use   Smoking status: Former    Packs/day: 1.00    Years: 58.00    Pack years: 58.00    Types: Cigarettes    Quit date: 10/22/2015    Years since quitting: 5.5   Smokeless tobacco: Never   Tobacco comments:    congratulated  Vaping Use   Vaping Use: Never used  Substance Use  Topics   Alcohol use: Never   Drug use: Never    Home Medications Prior to Admission medications   Medication Sig Start Date End Date Taking? Authorizing Provider  acetaminophen (TYLENOL) 500 MG tablet Take 1,000 mg by mouth every 6 (six) hours as needed for mild pain or fever.  01/30/17   [provider]  Cinnamon 500 MG TABS Take 1,000 mg by mouth daily.     [provider]  ELIQUIS 5 MG TABS tablet TAKE 1 TABLET BY MOUTH TWICE A DAY 04/15/21   [name], [name]  empagliflozin (JARDIANCE) 10 MG TABS tablet Take 1 tablet (10 mg total) by mouth daily before breakfast. 04/30/21   [name], [name]  Fluticasone-Umeclidin-Vilant (TRELEGY ELLIPTA)  100-62.5-25 MCG/INH AEPB Inhale 100 mcg into the lungs daily. 02/20/21   Martyn Ehrich, NP  furosemide (LASIX) 20 MG tablet TAKE 1 TABLET BY MOUTH EVERY DAY 03/10/21   [name], [name]  metFORMIN (GLUCOPHAGE) 1000 MG tablet TAKE 1 TABLET (1,000 MG TOTAL) BY MOUTH 2 (TWO) TIMES DAILY WITH A MEAL. 05/16/21 05/16/22  [name], [name]  Multiple Vitamins-Minerals (CENTRUM SILVER 50+WOMEN PO) Take 1 tablet by mouth daily.    [provider]  Multiple Vitamins-Minerals (OCUVITE EYE HEALTH FORMULA PO) Take 1 capsule by mouth daily.    [provider]  omeprazole (PRILOSEC) 40 MG capsule TAKE 1 CAPSULE BY MOUTH EVERY DAY 02/12/21   [name], [name]  pravastatin (PRAVACHOL) 10 MG tablet TAKE 1 TABLET BY MOUTH EVERY DAY 03/10/21   [name], [name]  Pseudoephedrine-guaiFENesin [facility] D PO) Take by mouth. nightly    [provider]  vitamin C (ASCORBIC ACID) 500 MG tablet Take 500 mg by mouth every other day.    [provider]  vitamin E 1000 UNIT capsule Take 1,000 Units by mouth every Monday, Wednesday, and Friday at 6 PM.    [provider]    Allergies    Ace inhibitors, Lisinopril, Penicillin g, Atorvastatin, Cortisone, Hydrocortisone, Meloxicam, and Pravastatin  Review of Systems   Review of Systems  Constitutional:  Negative for chills and fever.  HENT:  Negative for ear pain and sore throat.   Eyes:  Negative for pain and visual disturbance.  Respiratory:  Negative for cough and shortness of breath.   Cardiovascular:  Negative for chest pain and palpitations.  Gastrointestinal:  Positive for abdominal pain and blood in stool. Negative for vomiting.  Genitourinary:  Positive for vaginal bleeding. Negative for dysuria and hematuria.  Musculoskeletal:  Negative for arthralgias and back pain.  Skin:  Negative for color change and rash.  Neurological:  Negative for seizures and syncope.  All other systems reviewed and are negative.  Physical  Exam Updated Vital Signs BP 118/69   Pulse 75   Temp 97.8 F (36.6 C)   Resp (!) 22   SpO2 100%   Physical Exam Vitals and nursing note reviewed.  Constitutional:      General: She is not in acute distress.    Appearance: She is well-developed.  HENT:     Head: Normocephalic and atraumatic.  Eyes:     Conjunctiva/sclera: Conjunctivae normal.  Cardiovascular:     Rate and Rhythm: Normal rate and regular rhythm.     Heart sounds: No murmur heard. Pulmonary:     Effort: Pulmonary effort is normal. No respiratory distress.     Breath sounds: Normal breath sounds.  Abdominal:     Palpations: Abdomen is soft.  Tenderness: There is no abdominal tenderness.  Musculoskeletal:     Cervical back: Neck supple.  Skin:    General: Skin is warm and dry.  Neurological:     Mental Status: She is alert.    ED Results / Procedures / Treatments   Labs (all labs ordered are listed, but only abnormal results are displayed) Labs Reviewed  COMPREHENSIVE METABOLIC PANEL - Abnormal; Notable for the following components:      Result Value   Glucose, Bld 141 (*)    All other components within normal limits  CBC - Abnormal; Notable for the following components:   Hemoglobin 11.2 (*)    RDW 16.9 (*)    All other components within normal limits  POC OCCULT BLOOD, ED  TYPE AND SCREEN  ABO/RH    EKG None  Radiology No results found.  Procedures Procedures   Medications Ordered in ED Medications - No data to display  ED Course  I have reviewed the triage vital signs and the nursing notes.  Pertinent labs & imaging results that were available during my care of the patient were reviewed by me and considered in my medical decision making (see chart for details).    MDM Rules/Calculators/A&P                          1:54 PM 80 yo female on eliquis 5 mg bid for PE dx in July of 2022 presenting for vaginal and rectal bleeding.  -Hemoglobin stable. Abdomen soft and non tender.No  active bleeding at this time. Eliquis dosing discussed with ED pharmacist and decision made to have close follow up with provider regarding continuing versus stopping due to patient's recent PE.  Pt given close follow up with GI if bleeding continues.   Patient in no distress and overall condition improved here in the ED. Detailed discussions were had with the patient regarding current findings.The patient has been instructed to return immediately if the symptoms worsen in any way for re-evaluation. Patient verbalized understanding and is in agreement with current care plan. All questions answered prior to discharge.     Final Clinical Impression(s) / ED Diagnoses Final diagnoses:  Rectal bleeding    Rx / DC Orders ED Discharge Orders     None        Lianne Cure, DO 88/26/66 1940

## 2021-05-24 NOTE — ED Triage Notes (Signed)
Patient complains of rectal bleeding since yesterday and reports that it is bright red mixed with diarrhea. Patient also reports abdominal cramping, no nausea, no vomiting. No blood thinners. Alert and oriented

## 2021-05-25 ENCOUNTER — Telehealth: Payer: Self-pay | Admitting: Pulmonary Disease

## 2021-05-25 NOTE — Telephone Encounter (Signed)
Spoke to patient's daughter, Benita(DPR) is aware of below message and voiced her understanding.  Eileen Stanford stated that ED referred to GI. She has a pill cutter and will give patient 2.40m of Eliquis.  She will contact PCP. Nothing further needed at this time.

## 2021-05-25 NOTE — Telephone Encounter (Signed)
Spoke to patient's daughter, Brianna Adams stated that patient was seen in ED yesterday for rectal bleeding. It was discussed that Brianna Adams dosage may need to be decreased, as all test were normal.  Currently on Brianna Adams 32m.  [name] please advise.

## 2021-05-25 NOTE — Telephone Encounter (Signed)
I agree that she should have this reduced however we do not prescribe the medication this is prescribed by her primary care provider.  She should be reduced to 2.5 mg twice a day.  In addition, she should still be evaluated by GI for this issue as usually Eliquis on covers other problems that may be present.  She may need further imaging particularly since she also had vaginal bleeding.

## 2021-05-26 ENCOUNTER — Encounter: Payer: Self-pay | Admitting: Internal Medicine

## 2021-06-04 ENCOUNTER — Other Ambulatory Visit: Payer: Self-pay

## 2021-06-04 ENCOUNTER — Encounter: Payer: Self-pay | Admitting: Nurse Practitioner

## 2021-06-04 ENCOUNTER — Ambulatory Visit (INDEPENDENT_AMBULATORY_CARE_PROVIDER_SITE_OTHER): Payer: Medicare Other | Admitting: Nurse Practitioner

## 2021-06-04 VITALS — BP 120/68 | HR 88 | Temp 98.4°F | Ht 62.0 in | Wt 138.8 lb

## 2021-06-04 DIAGNOSIS — Z86711 Personal history of pulmonary embolism: Secondary | ICD-10-CM

## 2021-06-04 DIAGNOSIS — E119 Type 2 diabetes mellitus without complications: Secondary | ICD-10-CM

## 2021-06-04 DIAGNOSIS — C3490 Malignant neoplasm of unspecified part of unspecified bronchus or lung: Secondary | ICD-10-CM | POA: Insufficient documentation

## 2021-06-04 DIAGNOSIS — Z8719 Personal history of other diseases of the digestive system: Secondary | ICD-10-CM

## 2021-06-04 LAB — CBC
Hematocrit: 31.2 % — ABNORMAL LOW (ref 34.0–46.6)
Hemoglobin: 9.9 g/dL — ABNORMAL LOW (ref 11.1–15.9)
MCH: 27.3 pg (ref 26.6–33.0)
MCHC: 31.7 g/dL (ref 31.5–35.7)
MCV: 86 fL (ref 79–97)
Platelets: 313 10*3/uL (ref 150–450)
RBC: 3.63 x10E6/uL — ABNORMAL LOW (ref 3.77–5.28)
RDW: 15.8 % — ABNORMAL HIGH (ref 11.7–15.4)
WBC: 6.3 10*3/uL (ref 3.4–10.8)

## 2021-06-04 LAB — HEMOGLOBIN A1C
Est. average glucose Bld gHb Est-mCnc: 143 mg/dL
Hgb A1c MFr Bld: 6.6 % — ABNORMAL HIGH (ref 4.8–5.6)

## 2021-06-04 MED ORDER — APIXABAN 2.5 MG PO TABS: 2.5000 mg | ORAL_TABLET | Freq: Two times a day (BID) | ORAL | 5 refills | Status: DC

## 2021-06-04 NOTE — Patient Instructions (Signed)

## 2021-06-04 NOTE — Progress Notes (Signed)
I,Brianna Adams,acting as a Education administrator for [employer], [name].,have documented all relevant documentation on the behalf of Brianna Brine, [name],as directed by  Brianna Brine, [name] while in the presence of Brianna Adams, [place].   This visit occurred during the SARS-CoV-2 public health emergency.  Safety protocols were in place, including screening questions prior to the visit, additional usage of staff PPE, and extensive cleaning of exam room while observing appropriate contact time as indicated for disinfecting solutions.  Subjective:     Patient ID: Brianna Adams , female    DOB: March 31, 1941 , 80 y.o.   MRN: [id number]   Chief Complaint  Patient presents with   Diabetes   Follow-up     HPI  Patient presents today for a diabetes f/u. The patient went to the ER for evaluation of vaginal bleeding on Sept. 18, pt was was advised to cut back on her eliquis dose. She is taking 1/2 pill 2 times a day.   She does not have any additional questions or concerns.   Diabetes She presents for her follow-up diabetic visit. She has type 2 diabetes mellitus. There are no hypoglycemic associated symptoms. Pertinent negatives for hypoglycemia include no dizziness or headaches. There are no diabetic associated symptoms. Pertinent negatives for diabetes include no chest pain, no fatigue, no polydipsia, no polyphagia and no polyuria. There are no diabetic complications. Risk factors for coronary artery disease include sedentary lifestyle. Current diabetic treatment includes oral agent (monotherapy). She has not had a previous visit with a dietitian. (Does not check her blood sugars regularly ) Eye exam is not current.    Past Medical History:  Diagnosis Date   CHF (congestive heart failure) (HCC)    COPD (chronic obstructive pulmonary disease) (HCC)    Diabetes (HCC)    type 2   Diverticulitis    Dyspnea    On Oxygen 3L via [place]    GERD (gastroesophageal reflux disease)    High cholesterol    Lung cancer ([place])     PE (pulmonary thromboembolism) ([place]) 02/29/2020   Pulmonary hypertension ([place])    Uterine cancer ([place])      Family History  Problem Relation Age of Onset   Hypertension Mother    Heart attack Father    Cancer Maternal Aunt      Current Outpatient Medications:    Cinnamon 500 MG TABS, Take 1,000 mg by mouth daily. , Disp: , Rfl:    empagliflozin (JARDIANCE) 10 MG TABS tablet, Take 1 tablet (10 mg total) by mouth daily before breakfast., Disp: 30 tablet, Rfl: 3   Fluticasone-Umeclidin-Vilant (TRELEGY ELLIPTA) 100-62.5-25 MCG/INH AEPB, Inhale 100 mcg into the lungs daily., Disp: 60 each, Rfl: 5   furosemide (LASIX) 20 MG tablet, TAKE 1 TABLET BY MOUTH EVERY DAY, Disp: 90 tablet, Rfl: 2   metFORMIN (GLUCOPHAGE) 1000 MG tablet, TAKE 1 TABLET (1,000 MG TOTAL) BY MOUTH 2 (TWO) TIMES DAILY WITH A MEAL., Disp: 180 tablet, Rfl: 0   Multiple Vitamins-Minerals (CENTRUM SILVER 50+WOMEN PO), Take 1 tablet by mouth daily., Disp: , Rfl:    Multiple Vitamins-Minerals (OCUVITE EYE HEALTH FORMULA PO), Take 1 capsule by mouth daily., Disp: , Rfl:    omeprazole (PRILOSEC) 40 MG capsule, TAKE 1 CAPSULE BY MOUTH EVERY DAY, Disp: 90 capsule, Rfl: 1   pravastatin (PRAVACHOL) 10 MG tablet, TAKE 1 TABLET BY MOUTH EVERY DAY, Disp: 90 tablet, Rfl: 1   Pseudoephedrine-guaiFENesin (MUCINEX D PO), Take by mouth. nightly, Disp: , Rfl:    vitamin C (ASCORBIC  ACID) 500 MG tablet, Take 500 mg by mouth every other day., Disp: , Rfl:    vitamin E 1000 UNIT capsule, Take 1,000 Units by mouth every Monday, Wednesday, and Friday at 6 PM., Disp: , Rfl:    acetaminophen (TYLENOL) 500 MG tablet, Take 1,000 mg by mouth every 6 (six) hours as needed for mild pain or fever.  (Patient not taking: Reported on 06/04/2021), Disp: , Rfl:    apixaban (ELIQUIS) 2.5 MG TABS tablet, Take 1 tablet (2.5 mg total) by mouth 2 (two) times daily., Disp: 60 tablet, Rfl: 5   Allergies  Allergen Reactions   Ace Inhibitors Swelling   Lisinopril  Swelling   Penicillin G Hives, Shortness Of Breath and Rash   Atorvastatin Other (See Comments)    Myalgias (Muscle Pain)    Cortisone Other (See Comments)    Unknown reaction   Hydrocortisone Other (See Comments)    Unknown reaction   Meloxicam Other (See Comments)    Bloody stools   Pravastatin Other (See Comments)    Chest Pain if high dosage      Review of Systems  Constitutional:  Negative for fatigue.  Respiratory: Negative.  Negative for shortness of breath (Wearing oxygen via [place]).   Cardiovascular:  Negative for chest pain, palpitations and leg swelling.  Endocrine: Negative for polydipsia, polyphagia and polyuria.  Neurological:  Negative for dizziness and headaches.  Psychiatric/Behavioral: Negative.      Today's Vitals   06/04/21 1033  BP: 120/68  Pulse: 88  Temp: 98.4 F (36.9 C)  Weight: 138 lb 12.8 oz (63 kg)  Height: _0  (1.575 m)  PainSc: 0-No pain   Body mass index is 25.39 kg/m.  Wt Readings from Last 3 Encounters:  06/04/21 138 lb 12.8 oz (63 kg)  03/23/21 136 lb 9.6 oz (62 kg)  03/11/21 138 lb 7.2 oz (62.8 kg)    BP Readings from Last 3 Encounters:  06/04/21 120/68  05/24/21 108/62  03/23/21 (!) 110/58    Objective:  Physical Exam Vitals reviewed.  Constitutional:      General: She is not in acute distress.    Appearance: Normal appearance.  Cardiovascular:     Rate and Rhythm: Normal rate and regular rhythm.     Pulses: Normal pulses.     Heart sounds: Normal heart sounds. No murmur heard. Pulmonary:     Effort: Pulmonary effort is normal. No respiratory distress.     Breath sounds: Normal breath sounds. No wheezing.     Comments: She is wearing oxygen at 4 l/m Chest:     Chest wall: No tenderness.  Neurological:     General: No focal deficit present.     Mental Status: She is alert and oriented to person, place, and time.     Cranial Nerves: No cranial nerve deficit.     Motor: No weakness.  Psychiatric:        Mood and  Affect: Mood normal.        Behavior: Behavior normal.        Thought Content: Thought content normal.        Judgment: Judgment normal.        Assessment And Plan:     1. Type 2 diabetes mellitus without complication, without long-term current use of insulin (HCC) Comments: Stable, continue current medications No changes at this time - Hemoglobin A1c  2. History of pulmonary embolism Comments: She has been on Eliquis since June 2021, now on 85m (1/2  tab) two times a day, recent ER visit for rectal bleeding Will refer to Hematology for further evaluation to determine if needs to continue Eliquis. Daughter feels maybe they had been on a long trip and had not moved much prior to her PE - apixaban (ELIQUIS) 2.5 MG TABS tablet; Take 1 tablet (2.5 mg total) by mouth 2 (two) times daily.  Dispense: 60 tablet; Refill: 5 - Ambulatory referral to Hematology / Oncology  3. Squamous cell carcinoma lung, unspecified laterality ([place]) Comments: Continue follow up with Pulmonology  4. History of rectal bleeding Comments: ER visit 2 weeks ago for rectal bleeding, has appt with GI but may be related to Eliquis use. None currently - CBC    Patient was given opportunity to ask questions. Patient verbalized understanding of the plan and was able to repeat key elements of the plan. All questions were answered to their satisfaction.  Brianna Brine, [name]   I, Brianna Brine, [name], have reviewed all documentation for this visit. The documentation on 06/05/21 for the exam, diagnosis, procedures, and orders are all accurate and complete.   IF YOU HAVE BEEN REFERRED TO A SPECIALIST, IT MAY TAKE 1-2 WEEKS TO SCHEDULE/PROCESS THE REFERRAL. IF YOU HAVE NOT HEARD FROM US/SPECIALIST IN TWO WEEKS, PLEASE GIVE [place] A CALL AT [phone] X [phone].   THE PATIENT IS ENCOURAGED TO PRACTICE SOCIAL DISTANCING DUE TO THE COVID-19 PANDEMIC.

## 2021-06-19 ENCOUNTER — Encounter: Payer: Medicare Other | Admitting: Oncology

## 2021-06-19 ENCOUNTER — Other Ambulatory Visit: Payer: Medicare Other

## 2021-06-24 ENCOUNTER — Encounter: Payer: Self-pay | Admitting: Oncology

## 2021-06-24 ENCOUNTER — Ambulatory Visit (INDEPENDENT_AMBULATORY_CARE_PROVIDER_SITE_OTHER): Payer: Medicare Other | Admitting: Internal Medicine

## 2021-06-24 ENCOUNTER — Inpatient Hospital Stay: Payer: Medicare Other

## 2021-06-24 ENCOUNTER — Other Ambulatory Visit: Payer: Self-pay

## 2021-06-24 ENCOUNTER — Inpatient Hospital Stay: Payer: Medicare Other | Attending: Oncology | Admitting: Oncology

## 2021-06-24 ENCOUNTER — Encounter: Payer: Self-pay | Admitting: Internal Medicine

## 2021-06-24 VITALS — BP 117/67 | HR 65 | Temp 96.2°F | Resp 18 | Wt 139.0 lb

## 2021-06-24 VITALS — BP 104/56 | HR 78 | Ht 62.0 in | Wt 136.0 lb

## 2021-06-24 DIAGNOSIS — D500 Iron deficiency anemia secondary to blood loss (chronic): Secondary | ICD-10-CM

## 2021-06-24 DIAGNOSIS — D509 Iron deficiency anemia, unspecified: Secondary | ICD-10-CM | POA: Insufficient documentation

## 2021-06-24 DIAGNOSIS — K625 Hemorrhage of anus and rectum: Secondary | ICD-10-CM | POA: Diagnosis not present

## 2021-06-24 DIAGNOSIS — Z86711 Personal history of pulmonary embolism: Secondary | ICD-10-CM | POA: Insufficient documentation

## 2021-06-24 DIAGNOSIS — Z85118 Personal history of other malignant neoplasm of bronchus and lung: Secondary | ICD-10-CM | POA: Insufficient documentation

## 2021-06-24 DIAGNOSIS — J9611 Chronic respiratory failure with hypoxia: Secondary | ICD-10-CM | POA: Insufficient documentation

## 2021-06-24 DIAGNOSIS — I2699 Other pulmonary embolism without acute cor pulmonale: Secondary | ICD-10-CM

## 2021-06-24 DIAGNOSIS — Z809 Family history of malignant neoplasm, unspecified: Secondary | ICD-10-CM | POA: Diagnosis not present

## 2021-06-24 DIAGNOSIS — Z7901 Long term (current) use of anticoagulants: Secondary | ICD-10-CM | POA: Insufficient documentation

## 2021-06-24 DIAGNOSIS — Z87891 Personal history of nicotine dependence: Secondary | ICD-10-CM | POA: Diagnosis not present

## 2021-06-24 LAB — RETIC PANEL
Immature Retic Fract: 15.8 % (ref 2.3–15.9)
RBC.: 3.9 MIL/uL (ref 3.87–5.11)
Retic Count, Absolute: 72.2 10*3/uL (ref 19.0–186.0)
Retic Ct Pct: 1.9 % (ref 0.4–3.1)
Reticulocyte Hemoglobin: 27.7 pg — ABNORMAL LOW (ref >27.9)

## 2021-06-24 LAB — CBC WITH DIFFERENTIAL/PLATELET
Abs Immature Granulocytes: 0.01 10*3/uL (ref 0.00–0.07)
Basophils Absolute: 0.1 10*3/uL (ref 0.0–0.1)
Basophils Relative: 1 %
Eosinophils Absolute: 0.1 10*3/uL (ref 0.0–0.5)
Eosinophils Relative: 2 %
HCT: 34.2 % — ABNORMAL LOW (ref 36.0–46.0)
Hemoglobin: 10.5 g/dL — ABNORMAL LOW (ref 12.0–15.0)
Immature Granulocytes: 0 %
Lymphocytes Relative: 27 %
Lymphs Abs: 1.5 10*3/uL (ref 0.7–4.0)
MCH: 26.9 pg (ref 26.0–34.0)
MCHC: 30.7 g/dL (ref 30.0–36.0)
MCV: 87.5 fL (ref 80.0–100.0)
Monocytes Absolute: 0.4 10*3/uL (ref 0.1–1.0)
Monocytes Relative: 8 %
Neutro Abs: 3.4 10*3/uL (ref 1.7–7.7)
Neutrophils Relative %: 62 %
Platelets: 348 10*3/uL (ref 150–400)
RBC: 3.91 MIL/uL (ref 3.87–5.11)
RDW: 16.1 % — ABNORMAL HIGH (ref 11.5–15.5)
WBC: 5.4 10*3/uL (ref 4.0–10.5)
nRBC: 0 % (ref 0.0–0.2)

## 2021-06-24 LAB — IRON AND TIBC
Iron: 29 ug/dL (ref 28–170)
Saturation Ratios: 6 % — ABNORMAL LOW (ref 10.4–31.8)
TIBC: 468 ug/dL — ABNORMAL HIGH (ref 250–450)
UIBC: 439 ug/dL

## 2021-06-24 LAB — TECHNOLOGIST SMEAR REVIEW: Plt Morphology: ADEQUATE

## 2021-06-24 LAB — FERRITIN: Ferritin: 6 ng/mL — ABNORMAL LOW (ref 11–307)

## 2021-06-24 MED ORDER — FERROUS SULFATE 325 (65 FE) MG PO TBEC: 325.0000 mg | DELAYED_RELEASE_TABLET | Freq: Three times a day (TID) | ORAL | 2 refills | Status: DC

## 2021-06-24 MED ORDER — PEG 3350-KCL-NA BICARB-NACL 420 G PO SOLR: 4000.0000 mL | Freq: Once | ORAL | 0 refills | Status: AC

## 2021-06-24 NOTE — Progress Notes (Signed)
Chief Complaint: Rectal bleeding  HPI : 80 year old female with history of T2DM, PE on Eliquis, lung squamous cell cancer, pulm HTN, HFpEF, chronic hypoxic resp failure on 4 L [place] oxygen, CKD presents with rectal bleeding  She had an episode of bright red bleeding per rectum.  She also noted possible vaginal bleeding at that time, not clear whether or not the blood was coming from her rectum or her vagina.  Denies blood clots. Denies rectal pain or abdominal pain. Her last colonoscopy occurred >5 years ago and was reportedly normal. She has a history of diverticulitis one time in the past. Denies SOB or lightheadedness. Denies weight loss, changes in bowel habits. Denies N&V, dysphagia, melena. Denies NSAID use. She is currently on Eliquis 2.5 mg BID  On 05/24/21, she went to the ED for rectal bleeding and vaginal bleeding. Patient's Hb was found to be stable at that time. Her pulmonologist subsequently reduced her Eliquis from 5 mg BID to 2.5 mg BID   Past Medical History:  Diagnosis Date   CHF (congestive heart failure) ([place])    COPD (chronic obstructive pulmonary disease) ([place])    Diabetes ([place])    type 2   Diverticulitis    Dyspnea    On Oxygen 3L via [place]    GERD (gastroesophageal reflux disease)    High cholesterol    Lung cancer ([place])    PE (pulmonary thromboembolism) ([place]) 02/29/2020   Pulmonary hypertension ([place])    Uterine cancer [facility])      Past Surgical History:  Procedure Laterality Date   ABDOMINAL HYSTERECTOMY     cataract surgery  2012 and 2017   COLONOSCOPY     FIBEROPTIC BRONCHOSCOPY     GANGLION CYST EXCISION Left 09/11/2020   Procedure: EXCISION VOLAR RADIAL GANGLION OF LEFT WRIST;  Surgeon: Daryll Brod, MD;  Location: [place];  Service: Orthopedics;  Laterality: Left;  AXILLARY BLOCK   resection of lung cancer  2018   right lower lobe non-anatomocal lung resection wedge     right thorascoscopy     Family History  Problem Relation Age of Onset   Hypertension  Mother    Heart attack Father    Cancer Maternal Aunt    Social History   Tobacco Use   Smoking status: Former    Packs/day: 1.00    Years: 58.00    Pack years: 58.00    Types: Cigarettes    Quit date: 10/22/2015    Years since quitting: 5.6   Smokeless tobacco: Never   Tobacco comments:    congratulated  Vaping Use   Vaping Use: Never used  Substance Use Topics   Alcohol use: Never   Drug use: Never   Current Outpatient Medications  Medication Sig Dispense Refill   acetaminophen (TYLENOL) 500 MG tablet Take 1,000 mg by mouth every 6 (six) hours as needed for mild pain or fever.     apixaban (ELIQUIS) 2.5 MG TABS tablet Take 1 tablet (2.5 mg total) by mouth 2 (two) times daily. 60 tablet 5   Cinnamon 500 MG TABS Take 1,000 mg by mouth daily.      empagliflozin (JARDIANCE) 10 MG TABS tablet Take 1 tablet (10 mg total) by mouth daily before breakfast. 30 tablet 3   Fluticasone-Umeclidin-Vilant (TRELEGY ELLIPTA) 100-62.5-25 MCG/INH AEPB Inhale 100 mcg into the lungs daily. 60 each 5   furosemide (LASIX) 20 MG tablet TAKE 1 TABLET BY MOUTH EVERY DAY 90 tablet 2   metFORMIN (GLUCOPHAGE)  1000 MG tablet TAKE 1 TABLET (1,000 MG TOTAL) BY MOUTH 2 (TWO) TIMES DAILY WITH A MEAL. 180 tablet 0   Multiple Vitamins-Minerals (CENTRUM SILVER 50+WOMEN PO) Take 1 tablet by mouth daily.     Multiple Vitamins-Minerals (OCUVITE EYE HEALTH FORMULA PO) Take 1 capsule by mouth daily.     omeprazole (PRILOSEC) 40 MG capsule TAKE 1 CAPSULE BY MOUTH EVERY DAY 90 capsule 1   pravastatin (PRAVACHOL) 10 MG tablet TAKE 1 TABLET BY MOUTH EVERY DAY 90 tablet 1   Pseudoephedrine-guaiFENesin (MUCINEX D PO) Take by mouth. nightly     vitamin C (ASCORBIC ACID) 500 MG tablet Take 500 mg by mouth every other day.     vitamin E 1000 UNIT capsule Take 1,000 Units by mouth every Monday, Wednesday, and Friday at 6 PM.     No current facility-administered medications for this visit.   Allergies  Allergen Reactions    Ace Inhibitors Swelling   Lisinopril Swelling   Penicillin G Hives, Shortness Of Breath and Rash   Atorvastatin Other (See Comments)    Myalgias (Muscle Pain)    Cortisone Other (See Comments)    Unknown reaction   Hydrocortisone Other (See Comments)    Unknown reaction   Meloxicam Other (See Comments)    Bloody stools   Pravastatin Other (See Comments)    Chest Pain if high dosage      Review of Systems: All systems reviewed and negative except where noted in HPI.   Physical Exam: Ht _0  (1.575 m)   Wt 136 lb (61.7 kg)   BMI 24.87 kg/m  Constitutional: Pleasant,well-developed, female in no acute distress. HEENT: Normocephalic and atraumatic. Conjunctivae are normal. No scleral icterus. Cardiovascular: Normal rate, regular rhythm.  Pulmonary/chest: Effort normal and breath sounds normal. On 4 [place] oxygen. Abdominal: Soft, nondistended, nontender. Bowel sounds active throughout. There are no masses palpable. No hepatomegaly. Extremities: No edema Neurological: Alert and oriented to person place and time. Skin: Skin is warm and dry. No rashes noted. Psychiatric: Normal mood and affect. Behavior is normal.  Labs 05/24/21: CBC stable. CMP unremarkable. PT/INR and PTT normal.  Labs 06/24/21: CBC stable. Ferritin low at 6. Iron sat low at 6%  Esophagram 12/13/19: IMPRESSION: Mild spontaneous gastroesophageal reflux. Otherwise unremarkable study.  ASSESSMENT AND PLAN: Rectal bleeding Iron deficiency anemia Patient presents with an isolated episode of rectal bleeding about a month ago.  Hemoglobin has remained stable even after that episode of bleeding.  Ferritin was low, confirming iron deficiency anemia. Patient has been told in the past that she has diverticulosis so she may have had a diverticular bleed or hemorrhoidal bleed, exacerbated by being on Eliquis for anticoagulation. At this time with the patient's medical comorbidities (chronic hypoxic resp failure on 4 L [place]  oxygen, pulm HTN, etc), I do not think that the risks of EGD and/or colonoscopy for IDA outweigh the benefits of these procedures, especially in the setting of stable Hb. I am willing to offer her an unsedated flexible sigmoidoscopy for further evaluation of bright red blood per rectum.  Patient is interested in getting the sigmoidoscopy. The patient is scheduled to see hem/onc in a few weeks.  If at that time she is started on iron supplementation and does not respond appropriately, or demonstrates a drop in her blood counts, we could readdress the need for additional work up. - Flexible sigmoidoscopy without sedation. Eliquis would need to be held for 2 days before procedure  Christia Reading, MD

## 2021-06-24 NOTE — Progress Notes (Signed)
New pt referred by Dr Laurance Flatten with history of pulmonary embolism.  Pt is wearing oxygen at 4 liters a minute.  Reports history of lung and uterine cancer.

## 2021-06-24 NOTE — Progress Notes (Signed)
Hematology/Oncology Consult note [facility] [phone] [phone]   Patient Care Team: [name], [name] as PCP - General (General Practice)  REFERRING PROVIDER: [name], [name]  CHIEF COMPLAINTS/REASON FOR VISIT:  Evaluation of history of pulmonary embolism.  HISTORY OF PRESENTING ILLNESS:   Brianna Adams is a  80 y.o.  female with PMH listed below was seen in consultation at the request of  [name], [name]  for evaluation of history of pulmonary embolism.  Patient has been on oxygen via nasal cannula at home due to pulmonary hypertension.  She also has an early stage lung cancer status post a right lower lobe resection. 02/29/2020 patient presented emergency room for evaluation of shortness of breath.  Patient was found to have segmental PE of the right lower lobe with evidence of right heart strain.  And new area of the consolidation in the left lower lobe concerning for pneumonia.  Lower extremity ultrasound was negative for DVT.   Patient also was treated for pneumonia, acute decompensated CHF.Marland Kitchen  Patient was placed on heparin and discharged on Eliquis.  Patient takes Eliquis 5 mg twice daily since then on 08 May 2021, when she developed rectal bleeding.  Eliquis was decreased to 2.5 mg twice daily.  Patient currently is on nasal cannula oxygen 4 L.  Patient was accompanied by her daughter today.  Daughter reports history of long distance car trip-6 hours days 2 weeks prior to patient's PE diagnosis.  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  Patient follows up with Dr. [name] for surveillance, lung emphysema, chronic respiratory failure.  Patient denies any additional episodes of rectal bleeding since decrease to Eliquis 2.5 mg twice daily. Per patient and her daughter, patient has been quite  active despite chronic respiratory failure.  Per daughter, patient walks faster than her. Patient has. been referred to establish care with gastroenterology.  Review of Systems  Constitutional:  Negative for appetite change, chills, fatigue and fever.  HENT:   Negative for hearing loss and voice change.   Eyes:  Negative for eye problems.  Respiratory:  Positive for shortness of breath. Negative for chest tightness, cough and hemoptysis.   Cardiovascular:  Negative for chest pain.  Gastrointestinal:  Negative for abdominal distention, abdominal pain and blood in stool.  Endocrine: Negative for hot flashes.  Genitourinary:  Negative for difficulty urinating and frequency.   Musculoskeletal:  Negative for arthralgias.  Skin:  Negative for itching and rash.  Neurological:  Negative for extremity weakness.  Hematological:  Negative for adenopathy.  Psychiatric/Behavioral:  Negative for confusion.    MEDICAL HISTORY:  Past Medical History:  Diagnosis Date   CHF (congestive heart failure) (HCC)    COPD (chronic obstructive pulmonary disease) (HCC)    Diabetes ([place])    type 2   Diverticulitis    Dyspnea    On Oxygen 3L via [place]    GERD (gastroesophageal reflux disease)    High cholesterol    Lung cancer ([place])    PE (pulmonary thromboembolism) ([place]) 02/29/2020   Pulmonary hypertension ([place])    Uterine cancer ([place])     SURGICAL HISTORY: Past Surgical History:  Procedure Laterality Date   ABDOMINAL HYSTERECTOMY     cataract surgery  2012 and 2017   COLONOSCOPY     FIBEROPTIC BRONCHOSCOPY     GANGLION CYST EXCISION  Left 09/11/2020   Procedure: EXCISION VOLAR RADIAL GANGLION OF LEFT WRIST;  Surgeon: Daryll Brod, MD;  Location: [place];  Service: Orthopedics;  Laterality: Left;  AXILLARY BLOCK   resection of lung cancer  2018   right lower lobe non-anatomocal lung resection wedge     right thorascoscopy      SOCIAL HISTORY: Social History   Socioeconomic History   Marital status:  Widowed    Spouse name: Not on file   Number of children: 1   Years of education: Not on file   Highest education level: Master's degree (e.g., MA, MS, MEng, MEd, MSW, MBA)  Occupational History   Occupation: retired    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r-sr Pharmacist, hospital  Tobacco Use   Smoking status: Former    Packs/day: 1.00    Years: 58.00    Pack years: 58.00    Types: Cigarettes    Quit date: 10/22/2015    Years since quitting: 5.6   Smokeless tobacco: Never   Tobacco comments:    Warehouse manager Use: Never used  Substance and Sexual Activity   Alcohol use: Never   Drug use: Never   Sexual activity: Not Currently    Birth control/protection: Surgical    Comment: HYSTERECTOMY  Other Topics Concern   Not on file  Social History Narrative   Not on file   Social Determinants of Health   Financial Resource Strain: Low Risk    Difficulty of Paying Living Expenses: Not hard at all  Food Insecurity: No Food Insecurity   Worried About Charity fundraiser in the Last Year: Never true   [place] in the Last Year: Never true  Transportation Needs: No Transportation Needs   Lack of Transportation (Medical): No   Lack of Transportation (Non-Medical): No  Physical Activity: Insufficiently Active   Days of Exercise per Week: 5 days   Minutes of Exercise per Session: 20 min  Stress: No Stress Concern Present   Feeling of Stress : Not at all  Social Connections: Not on file  Intimate Partner Violence: Not on file    FAMILY HISTORY: Family History  Problem Relation Age of Onset   Hypertension Mother    Heart attack Father    Cancer Maternal Aunt        unknown type   Stomach cancer Neg Hx    Colon cancer Neg Hx    Esophageal cancer Neg Hx    Pancreatic cancer Neg Hx     ALLERGIES:  is allergic to ace inhibitors, lisinopril, penicillin g, atorvastatin, cortisone, hydrocortisone, meloxicam, and pravastatin.  MEDICATIONS:  Current Outpatient Medications   Medication Sig Dispense Refill   acetaminophen (TYLENOL) 500 MG tablet Take 1,000 mg by mouth every 6 (six) hours as needed for mild pain or fever.     apixaban (ELIQUIS) 2.5 MG TABS tablet Take 1 tablet (2.5 mg total) by mouth 2 (two) times daily. 60 tablet 5   Cinnamon 500 MG TABS Take 1,000 mg by mouth daily.      empagliflozin (JARDIANCE) 10 MG TABS tablet Take 1 tablet (10 mg total) by mouth daily before breakfast. 30 tablet 3   Fluticasone-Umeclidin-Vilant (TRELEGY ELLIPTA) 100-62.5-25 MCG/INH AEPB Inhale 100 mcg into the lungs daily. 60 each 5   furosemide (LASIX) 20 MG tablet TAKE 1 TABLET BY MOUTH EVERY DAY 90 tablet 2   metFORMIN (GLUCOPHAGE) 1000 MG tablet TAKE 1 TABLET (1,000 MG TOTAL) BY MOUTH 2 (TWO) TIMES DAILY  WITH A MEAL. 180 tablet 0   Multiple Vitamins-Minerals (CENTRUM SILVER 50+WOMEN PO) Take 1 tablet by mouth daily.     Multiple Vitamins-Minerals (OCUVITE EYE HEALTH FORMULA PO) Take 1 capsule by mouth daily.     omeprazole (PRILOSEC) 40 MG capsule TAKE 1 CAPSULE BY MOUTH EVERY DAY 90 capsule 1   pravastatin (PRAVACHOL) 10 MG tablet TAKE 1 TABLET BY MOUTH EVERY DAY 90 tablet 1   Pseudoephedrine-guaiFENesin (MUCINEX D PO) Take by mouth. nightly     vitamin C (ASCORBIC ACID) 500 MG tablet Take 500 mg by mouth every other day.     vitamin E 1000 UNIT capsule Take 1,000 Units by mouth every Monday, Wednesday, and Friday at 6 PM.     polyethylene glycol-electrolytes (NULYTELY) 420 g solution Take 4,000 mLs by mouth once for 1 dose. 4000 mL 0   No current facility-administered medications for this visit.     PHYSICAL EXAMINATION: ECOG PERFORMANCE STATUS: 1 - Symptomatic but completely ambulatory Vitals:   06/24/21 0953  BP: 117/67  Pulse: 65  Resp: 18  Temp: (!) 96.2 F (35.7 C)  SpO2: 100%   Filed Weights   06/24/21 0953  Weight: 139 lb (63 kg)    Physical Exam Constitutional:      General: She is not in acute distress. HENT:     Head: Normocephalic and  atraumatic.  Eyes:     General: No scleral icterus. Cardiovascular:     Rate and Rhythm: Normal rate and regular rhythm.     Heart sounds: Normal heart sounds.  Pulmonary:     Effort: Pulmonary effort is normal. No respiratory distress.     Breath sounds: No wheezing.     Comments: Decreased breath sound bilaterally.  Patient breathes comfortably via nasal cannula oxygen Abdominal:     General: Bowel sounds are normal. There is no distension.     Palpations: Abdomen is soft.  Musculoskeletal:        General: No deformity. Normal range of motion.     Cervical back: Normal range of motion and neck supple.  Skin:    General: Skin is warm and dry.     Findings: No erythema or rash.  Neurological:     Mental Status: She is alert and oriented to person, place, and time. Mental status is at baseline.     Cranial Nerves: No cranial nerve deficit.     Coordination: Coordination normal.  Psychiatric:        Mood and Affect: Mood normal.    LABORATORY DATA:  I have reviewed the data as listed Lab Results  Component Value Date   WBC 5.4 06/24/2021   HGB 10.5 (L) 06/24/2021   HCT 34.2 (L) 06/24/2021   MCV 87.5 06/24/2021   PLT 348 06/24/2021   Recent Labs    03/09/21 1137 03/10/21 0620 05/24/21 0730 05/24/21 1356  NA 132* 136 136  --   K 3.9 3.7 4.4  --   CL 96* 103 101  --   CO2 _0 --   GLUCOSE 244* 149* 141*  --   BUN _1 --   CREATININE 1.25* 0.91 0.86  --   CALCIUM 9.5 9.2 9.6  --   GFRNONAA 43* >60 >60  --   PROT 7.9  --  7.1 6.9  ALBUMIN 3.8  --  3.7 3.5  AST 20  --  18 13*  ALT 14  --  12 12  ALKPHOS 53  --  58 46  BILITOT 0.9  --  0.5 0.6  BILIDIR  --   --   --  <0.1  IBILI  --   --   --  NOT CALCULATED   Iron/TIBC/Ferritin/ %Sat    Component Value Date/Time   IRON 29 06/24/2021 1040   TIBC 468 (H) 06/24/2021 1040   FERRITIN 6 (L) 06/24/2021 1040   IRONPCTSAT 6 (L) 06/24/2021 1040      RADIOGRAPHIC STUDIES: I have personally reviewed the  radiological images as listed and agreed with the findings in the report. DG Chest 2 View  Result Date: 04/13/2021 CLINICAL DATA:  Follow-up pneumonia EXAM: CHEST - 2 VIEW COMPARISON:  03/09/2021 FINDINGS: Heart size is at the upper limits of normal. Aortic atherosclerosis as seen previously. Chronic interstitial lung markings. Areas of patchy bronchopneumonia seen previously have largely resolved. Focal density in the left midlung representing treated tumor is unchanged from previous examinations. Abnormal appearance of the adjacent left lateral fifth rib as seen previously. IMPRESSION: Improvement of patchy pulmonary infiltrates seen previously. Background pattern of chronic interstitial lung markings related to emphysema and chronic lung disease. Density related to previously treated tumor on the left is unchanged. Electronically Signed   By: Nelson Chimes M.D.   On: 04/13/2021 14:42   US THYROID  Result Date: 03/28/2021 CLINICAL DATA:  Incidental on CT. EXAM: THYROID ULTRASOUND TECHNIQUE: Ultrasound examination of the thyroid gland and adjacent soft tissues was performed. COMPARISON:  CT scan of the chest 01/16/2021 FINDINGS: Parenchymal Echotexture: Markedly heterogenous Isthmus: 0.7 cm Right lobe: 4.7 x 2.0 x 2.5 cm Left lobe: 6.0 x 2.1 x 2.3 cm _________________________________________________________ Estimated total number of nodules >/= 1 cm: 6-10 Number of spongiform nodules >/=  2 cm not described below (TR1): 0 Number of mixed cystic and solid nodules >/= 1.5 cm not described below (TR2): 0 _________________________________________________________ Diffusely enlarged, heterogeneous and multinodular thyroid gland. There are near innumerable spongiform and small isoechoic solid nodules scattered throughout the entirety of the gland. More diffuse goitrous change present in the left inferior gland giving the appearance of pseudo nodularity. There are no discrete thyroid nodules which would meet consensus  criteria to suggest biopsy or dedicated imaging surveillance. No further follow-up required. 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 None the visualized nodules meet criteria to recommend biopsy or further imaging evaluation. No further follow-up required. The above is in keeping with the ACR TI-RADS recommendations - J Am Coll Radiol 2017;14:587-595. Electronically Signed   By: Jacqulynn Cadet M.D.   On: 03/28/2021 08:20       ASSESSMENT & PLAN:  1. History of pulmonary embolism   2. Iron deficiency anemia due to chronic blood loss   3. Chronic respiratory failure with hypoxia (HCC)   4. History of lung cancer    #History of pulmonary embolism. Patient's daughter reports immobilization triggers prior to her diagnosis.  Daughter is not sure about exactly the timeframe of immobilization event. Currently patient is on Eliquis 2.5 mg twice daily. I will check hypercoagulable work-up today. She may had a provoked pulmonary embolism.  However with her multiple comorbidities including history of cancer, sedentary lifestyle, I think patient is at risk of developing recurrent pulmonary embolism.  If she is not able to tolerate Eliquis 2.5 mg twice daily, I will switch to aspirin 162 mg daily.  We will further discuss with her at the next visit.  #Rectal bleeding, I will defer to GI for work-up.  She has appointment this afternoon.  Okay to hold Eliquis 2.5 twice daily 2 to 3 days prior to the procedure and resume afterwards.  #Iron deficiency anemia, Iron panel was checked today which showed decreased iron saturation of 6, ferritin 6, increased TIBC 468. I recommend patient to take ferrous sulfate 325 mg twice daily.  #History of lung cancer, follow-up with pulmonology.   01/16/2021 CT scan showed no evidence of disease recurrence.  Orders Placed This Encounter  Procedures   Iron and TIBC    Standing Status:   Future     Number of Occurrences:   1    Standing Expiration Date:   06/24/2022   Ferritin    Standing Status:   Future    Number of Occurrences:   1    Standing Expiration Date:   12/23/2021   CBC with Differential/Platelet    Standing Status:   Future    Number of Occurrences:   1    Standing Expiration Date:   06/24/2022   Technologist smear review    Standing Status:   Future    Number of Occurrences:   1    Standing Expiration Date:   06/24/2022   Retic Panel    Standing Status:   Future    Number of Occurrences:   1    Standing Expiration Date:   06/24/2022   ANTIPHOSPHOLIPID SYNDROME PROF    Standing Status:   Future    Number of Occurrences:   1    Standing Expiration Date:   06/24/2022   Prothrombin gene mutation    Standing Status:   Future    Number of Occurrences:   1    Standing Expiration Date:   06/24/2022   Factor 5 leiden    Standing Status:   Future    Number of Occurrences:   1    Standing Expiration Date:   06/24/2022    All questions were answered. The patient knows to call the clinic with any problems questions or concerns.  cc [name], [name]    Return of visit: 2 to 3 weeks to go over results. Thank you for this kind referral and the opportunity to participate in the care of this patient. A copy of today's note is routed to referring provider    Earlie Server, MD, PhD Hematology Oncology [place] at [facility]  06/24/2021

## 2021-06-24 NOTE — Patient Instructions (Signed)
You have been scheduled for a flexible sigmoidoscopy. Please follow the written instructions given to you at your visit today. If you use inhalers (even only as needed), please bring them with you on the day of your procedure.  We sent a prep solution to your pharmacy, please pick it up in the next few days. You will only drink HALF of the bottle.  It was great seeing you today! Thank you for entrusting me with your care and choosing [facility].  Dr. Lorenso Courier  The [place] [facility] providers would like to encourage you to use [facility] to communicate with providers for non-urgent requests or questions.  Due to long hold times on the telephone, sending your provider a message by [facility] may be faster and more efficient way to get a response. Please allow 48 business hours for a response.  Please remember that this is for non-urgent requests/questions.  If you are age 81 or older, your body mass index should be between 23-30. Your Body mass index is 24.87 kg/m. If this is out of the aforementioned range listed, please consider follow up with your Primary Care Provider.  If you are age 76 or younger, your body mass index should be between 19-25. Your Body mass index is 24.87 kg/m. If this is out of the aformentioned range listed, please consider follow up with your Primary Care Provider.

## 2021-06-26 LAB — ANTIPHOSPHOLIPID SYNDROME PROF
Anticardiolipin IgG: <9 GPL U/mL (ref 0–14)
Anticardiolipin IgM: 12 MPL U/mL (ref 0–12)
DRVVT: 29.3 s (ref 0.0–47.0)
PTT Lupus Anticoagulant: 27 s (ref 0.0–51.9)

## 2021-06-29 ENCOUNTER — Telehealth: Payer: Self-pay

## 2021-06-29 LAB — FACTOR 5 LEIDEN

## 2021-06-29 NOTE — Telephone Encounter (Signed)
Per Dr. Collie Siad Mychart message to pt: You iron level is low and I recommend you to take iron supplementation twice daily.  Rx has been sent to your pharmacy.  If you get constipation from iron tablets, please take over the counter stool softner colace 170m 1-2 time daily.   Pt has been contacted and notified of recommendation via phone.

## 2021-06-29 NOTE — Telephone Encounter (Signed)
-----  Message from Earlie Server, MD sent at 06/24/2021  7:14 PM EDT ----- My chart msg sent.

## 2021-06-30 ENCOUNTER — Telehealth: Payer: Self-pay

## 2021-06-30 NOTE — Telephone Encounter (Signed)
She has not been seen since April. Will need office appointment for clearance

## 2021-06-30 NOTE — Telephone Encounter (Signed)
Request for surgical clearance:     Endoscopy Procedure  What type of surgery is being performed?     Flexiable sigmoidoscopy  When is this surgery scheduled?     08/13/21  What type of clearance is required ?   Pharmacy  Are there any medications that need to be held prior to surgery and how long? 2 day hold Eliquis  Practice name and name of physician performing surgery?      [facility]  What is your office phone and fax number?      Phone- [phone]  [phone]  Anesthesia type (None, local, MAC, general) ?       MAC

## 2021-06-30 NOTE — Telephone Encounter (Signed)
Routing to [place] as she is helping out with pts that need surgical clearance.

## 2021-06-30 NOTE — Telephone Encounter (Signed)
ATC patient to get her scheduled for an appt for surgical clearance, LMTCB will route to [facility] Triage as she is a patient there

## 2021-07-01 LAB — PROTHROMBIN GENE MUTATION

## 2021-07-01 NOTE — Telephone Encounter (Signed)
Lm for patient.

## 2021-07-01 NOTE — Telephone Encounter (Signed)
Appt scheduled 07/21/2021 at 12:00. Nothing further needed at this time.

## 2021-07-08 ENCOUNTER — Other Ambulatory Visit: Payer: Self-pay | Admitting: Nurse Practitioner

## 2021-07-08 DIAGNOSIS — E119 Type 2 diabetes mellitus without complications: Secondary | ICD-10-CM

## 2021-07-15 ENCOUNTER — Ambulatory Visit: Payer: Medicare Other | Admitting: Oncology

## 2021-07-16 ENCOUNTER — Encounter: Payer: Self-pay | Admitting: Nurse Practitioner

## 2021-07-16 ENCOUNTER — Other Ambulatory Visit: Payer: Self-pay

## 2021-07-16 ENCOUNTER — Other Ambulatory Visit: Payer: Self-pay | Admitting: Nurse Practitioner

## 2021-07-16 ENCOUNTER — Inpatient Hospital Stay: Payer: Medicare Other | Attending: Oncology | Admitting: Oncology

## 2021-07-16 ENCOUNTER — Encounter: Payer: Self-pay | Admitting: Oncology

## 2021-07-16 VITALS — BP 122/63 | HR 95 | Temp 98.2°F | Resp 18 | Wt 136.7 lb

## 2021-07-16 DIAGNOSIS — Z7901 Long term (current) use of anticoagulants: Secondary | ICD-10-CM | POA: Diagnosis not present

## 2021-07-16 DIAGNOSIS — Z86711 Personal history of pulmonary embolism: Secondary | ICD-10-CM | POA: Diagnosis not present

## 2021-07-16 DIAGNOSIS — Z87891 Personal history of nicotine dependence: Secondary | ICD-10-CM | POA: Diagnosis not present

## 2021-07-16 DIAGNOSIS — I272 Pulmonary hypertension, unspecified: Secondary | ICD-10-CM | POA: Insufficient documentation

## 2021-07-16 DIAGNOSIS — Z85118 Personal history of other malignant neoplasm of bronchus and lung: Secondary | ICD-10-CM | POA: Diagnosis not present

## 2021-07-16 DIAGNOSIS — E119 Type 2 diabetes mellitus without complications: Secondary | ICD-10-CM

## 2021-07-16 DIAGNOSIS — D500 Iron deficiency anemia secondary to blood loss (chronic): Secondary | ICD-10-CM | POA: Diagnosis present

## 2021-07-16 DIAGNOSIS — J9611 Chronic respiratory failure with hypoxia: Secondary | ICD-10-CM | POA: Diagnosis not present

## 2021-07-16 DIAGNOSIS — C3431 Malignant neoplasm of lower lobe, right bronchus or lung: Secondary | ICD-10-CM | POA: Diagnosis not present

## 2021-07-16 MED ORDER — DOCUSATE SODIUM 100 MG PO CAPS: 100.0000 mg | ORAL_CAPSULE | Freq: Every day | ORAL | 1 refills | Status: DC

## 2021-07-16 MED ORDER — METFORMIN HCL 1000 MG PO TABS: 1000.0000 mg | ORAL_TABLET | Freq: Two times a day (BID) | ORAL | 1 refills | Status: DC

## 2021-07-16 NOTE — Progress Notes (Signed)
Hematology/Oncology follow up note  [phone] [phone]   Patient Care Team: [name], [name] as PCP - General (General Practice)  REFERRING PROVIDER: [name], [name]  CHIEF COMPLAINTS/REASON FOR VISIT:   history of pulmonary embolism.  HISTORY OF PRESENTING ILLNESS:   Brianna Adams is a  80 y.o.  female with PMH listed below was seen in consultation at the request of  [name], [name]  for evaluation of history of pulmonary embolism.  Patient has been on oxygen via nasal cannula at home due to pulmonary hypertension.  She also has an early stage lung cancer status post a right lower lobe resection. 02/29/2020 patient presented emergency room for evaluation of shortness of breath.  Patient was found to have segmental PE of the right lower lobe with evidence of right heart strain.  And new area of the consolidation in the left lower lobe concerning for pneumonia.  Lower extremity ultrasound was negative for DVT.   Patient also was treated for pneumonia, acute decompensated CHF.Marland Kitchen  Patient was placed on heparin and discharged on Eliquis.  Patient takes Eliquis 5 mg twice daily since then on 05/08/2021, when she developed rectal bleeding.  Eliquis was decreased to 2.5 mg twice daily.  Patient currently is on nasal cannula oxygen 4 L.  Patient was accompanied by her daughter today.  Daughter reports history of long distance car trip-6 hours days 2 weeks prior to patient's PE diagnosis.  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  Patient follows up with Dr. [name] for surveillance, lung emphysema, chronic respiratory failure.  Patient denies any additional episodes of rectal bleeding since decrease to Eliquis 2.5 mg twice daily. Per patient and her daughter, patient has been quite active despite chronic respiratory failure.  Per  daughter, patient walks faster than her. Patient has. been referred to establish care with gastroenterology.  INTERVAL HISTORY Brianna Adams is a 80 y.o. female who has above history reviewed by me today presents for follow up visit for history of pulmonary embolism. Denies any additional rectal bleeding events while on Eliquis 2.5 mg daily. Patient has chronic respiratory failure, denies shortness of breath more than her baseline level. Patient has colonoscopy scheduled in December 2022.  Review of Systems  Constitutional:  Negative for appetite change, chills, fatigue and fever.  HENT:   Negative for hearing loss and voice change.   Eyes:  Negative for eye problems.  Respiratory:  Positive for shortness of breath. Negative for chest tightness, cough and hemoptysis.   Cardiovascular:  Negative for chest pain.  Gastrointestinal:  Negative for abdominal distention, abdominal pain and blood in stool.  Endocrine: Negative for hot flashes.  Genitourinary:  Negative for difficulty urinating and frequency.   Musculoskeletal:  Negative for arthralgias.  Skin:  Negative for itching and rash.  Neurological:  Negative for extremity weakness.  Hematological:  Negative for adenopathy.  Psychiatric/Behavioral:  Negative for confusion.    MEDICAL HISTORY:  Past Medical History:  Diagnosis Date   CHF (congestive heart failure) (HCC)    COPD (chronic obstructive pulmonary disease) (HCC)    Diabetes ([place])    type 2   Diverticulitis    Dyspnea    On Oxygen 3L via [place]    GERD (gastroesophageal reflux disease)    High cholesterol    Lung cancer ([place])    PE (pulmonary thromboembolism) (  [place]) 02/29/2020   Pulmonary hypertension ([place])    Uterine cancer [facility])     SURGICAL HISTORY: Past Surgical History:  Procedure Laterality Date   ABDOMINAL HYSTERECTOMY     cataract surgery  2012 and 2017   COLONOSCOPY     FIBEROPTIC BRONCHOSCOPY     GANGLION CYST EXCISION Left 09/11/2020   Procedure: EXCISION  VOLAR RADIAL GANGLION OF LEFT WRIST;  Surgeon: Daryll Brod, MD;  Location: [place];  Service: Orthopedics;  Laterality: Left;  AXILLARY BLOCK   resection of lung cancer  2018   right lower lobe non-anatomocal lung resection wedge     right thorascoscopy      SOCIAL HISTORY: 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r-sr Pharmacist, hospital  Tobacco Use   Smoking status: Former    Packs/day: 1.00    Years: 58.00    Pack years: 58.00    Types: Cigarettes    Quit date: 10/22/2015    Years since quitting: 5.7   Smokeless tobacco: Never   Tobacco comments:    Warehouse manager Use: Never used  Substance and Sexual Activity   Alcohol use: Never   Drug use: Never   Sexual activity: Not Currently    Birth control/protection: Surgical    Comment: HYSTERECTOMY  Other Topics Concern   Not on file  Social History Narrative   Not on file   Social Determinants of Health   Financial Resource Strain: Low Risk    Difficulty of Paying Living Expenses: Not hard at all  Food Insecurity: No Food Insecurity   Worried About Charity fundraiser in the Last Year: Never true   [place] in the Last Year: Never true  Transportation Needs: No Transportation Needs   Lack of Transportation (Medical): No   Lack of Transportation (Non-Medical): No  Physical Activity: Insufficiently Active   Days of Exercise per Week: 5 days   Minutes of Exercise per Session: 20 min  Stress: No Stress Concern Present   Feeling of Stress : Not at all  Social Connections: Not on file  Intimate Partner Violence: Not on file    FAMILY HISTORY: Family History  Problem Relation Age of Onset   Hypertension Mother    Heart attack Father    Cancer  Maternal Aunt        unknown type   Stomach cancer Neg Hx    Colon cancer Neg Hx    Esophageal cancer Neg Hx    Pancreatic cancer Neg Hx     ALLERGIES:  is allergic to ace inhibitors, lisinopril, penicillin g, atorvastatin, cortisone, hydrocortisone, meloxicam, and pravastatin.  MEDICATIONS:  Current Outpatient Medications  Medication Sig Dispense Refill   acetaminophen (TYLENOL) 500 MG tablet Take 1,000 mg by mouth every 6 (six) hours as needed for mild pain or fever.     apixaban (ELIQUIS) 2.5 MG TABS tablet Take 1 tablet (2.5 mg total) by mouth 2 (two) times daily. 60 tablet 5   Cinnamon 500 MG TABS Take 1,000 mg by mouth daily.      docusate sodium (COLACE) 100 MG capsule Take 1 capsule (100 mg total) by mouth daily. 60 capsule 1  empagliflozin (JARDIANCE) 10 MG TABS tablet Take 1 tablet (10 mg total) by mouth daily before breakfast. 30 tablet 3   ferrous sulfate 325 (65 FE) MG EC tablet Take 1 tablet (325 mg total) by mouth 3 (three) times daily with meals. 60 tablet 2   Fluticasone-Umeclidin-Vilant (TRELEGY ELLIPTA) 100-62.5-25 MCG/INH AEPB Inhale 100 mcg into the lungs daily. 60 each 5   furosemide (LASIX) 20 MG tablet TAKE 1 TABLET BY MOUTH EVERY DAY 90 tablet 2   metFORMIN (GLUCOPHAGE) 1000 MG tablet Take 1 tablet (1,000 mg total) by mouth 2 (two) times daily with a meal. 180 tablet 1   Multiple Vitamins-Minerals (CENTRUM SILVER 50+WOMEN PO) Take 1 tablet by mouth daily.     Multiple Vitamins-Minerals (OCUVITE EYE HEALTH FORMULA PO) Take 1 capsule by mouth daily.     omeprazole (PRILOSEC) 40 MG capsule TAKE 1 CAPSULE BY MOUTH EVERY DAY 90 capsule 1   pravastatin (PRAVACHOL) 10 MG tablet TAKE 1 TABLET BY MOUTH EVERY DAY 90 tablet 1   Pseudoephedrine-guaiFENesin (MUCINEX D PO) Take by mouth. nightly     vitamin C (ASCORBIC ACID) 500 MG tablet Take 500 mg by mouth every other day.     vitamin E 1000 UNIT capsule Take 1,000 Units by mouth every Monday, Wednesday, and Friday at 6 PM.      No current facility-administered medications for this visit.     PHYSICAL EXAMINATION: ECOG PERFORMANCE STATUS: 1 - Symptomatic but completely ambulatory Vitals:   07/16/21 1348  BP: 122/63  Pulse: 95  Resp: 18  Temp: 98.2 F (36.8 C)  SpO2: 90%   Filed Weights   07/16/21 1348  Weight: 136 lb 11.2 oz (62 kg)    Physical Exam Constitutional:      General: She is not in acute distress. HENT:     Head: Normocephalic and atraumatic.  Eyes:     General: No scleral icterus. Cardiovascular:     Rate and Rhythm: Normal rate and regular rhythm.     Heart sounds: Normal heart sounds.  Pulmonary:     Effort: Pulmonary effort is normal. No respiratory distress.     Breath sounds: No wheezing.     Comments: Decreased breath sound bilaterally.  Patient breathes comfortably via nasal cannula oxygen Abdominal:     General: Bowel sounds are normal. There is no distension.     Palpations: Abdomen is soft.  Musculoskeletal:        General: No deformity. Normal range of motion.     Cervical back: Normal range of motion and neck supple.  Skin:    General: Skin is warm and dry.     Findings: No erythema or rash.  Neurological:     Mental Status: She is alert and oriented to person, place, and time. Mental status is at baseline.     Cranial Nerves: No cranial nerve deficit.     Coordination: Coordination normal.  Psychiatric:        Mood and Affect: Mood normal.    LABORATORY DATA:  I have reviewed the data as listed Lab Results  Component Value Date   WBC 5.4 06/24/2021   HGB 10.5 (L) 06/24/2021   HCT 34.2 (L) 06/24/2021   MCV 87.5 06/24/2021   PLT 348 06/24/2021   Recent Labs    03/09/21 1137 03/10/21 0620 05/24/21 0730 05/24/21 1356  NA 132* 136 136  --   K 3.9 3.7 4.4  --   CL 96* 103 101  --  CO2 _0 --   GLUCOSE 244* 149* 141*  --   BUN _1 --   CREATININE 1.25* 0.91 0.86  --   CALCIUM 9.5 9.2 9.6  --   GFRNONAA 43* >60 >60  --   PROT 7.9   --  7.1 6.9  ALBUMIN 3.8  --  3.7 3.5  AST 20  --  18 13*  ALT 14  --  12 12  ALKPHOS 53  --  45 46  BILITOT 0.9  --  0.5 0.6  BILIDIR  --   --   --  <0.1  IBILI  --   --   --  NOT CALCULATED    Iron/TIBC/Ferritin/ %Sat    Component Value Date/Time   IRON 29 06/24/2021 1040   TIBC 468 (H) 06/24/2021 1040   FERRITIN 6 (L) 06/24/2021 1040   IRONPCTSAT 6 (L) 06/24/2021 1040       RADIOGRAPHIC STUDIES: I have personally reviewed the radiological images as listed and agreed with the findings in the report. No results found.     ASSESSMENT & PLAN:  1. Iron deficiency anemia due to chronic blood loss   2. History of pulmonary embolism   3. History of lung cancer   4. Chronic respiratory failure with hypoxia (HCC)    #History of pulmonary embolism. Patient's daughter reports immobilization triggers prior to her diagnosis.  Daughter is not sure about exactly the timeframe of immobilization event. I recommend patient to continue Eliquis 2.5 mg twice daily.  She currently tolerates well with no additional rectal bleeding events.  Hypercoagulable work-up is negative. Even though she may had a provoked pulmonary embolism, she still has multiple contributing factors including history of cancer, sedentary lifestyle.  I think she is at risk of developing recurrent pulmonary embolism. Pending gastroenterology work-up currently. Recommend patient to continue Eliquis 2.5 mg twice daily. Okay to hold Eliquis 2.5 twice daily 2 to 3 days prior to the procedure and resume afterwards.   #Iron deficiency anemia, Iron panel was checked today which showed decreased iron saturation of 6, ferritin 6, increased TIBC 468. She has not started oral iron supplementation yet.  Prescription has been sent to the pharmacy.  Advised patient to start.  #History of lung cancer, follow-up with pulmonology.   01/16/2021 CT scan showed no evidence of disease recurrence.  Orders Placed This Encounter  Procedures    CBC    Standing Status:   Standing    Number of Occurrences:   1    Standing Expiration Date:   07/16/2022   Ferritin    Standing Status:   Standing    Number of Occurrences:   1    Standing Expiration Date:   07/16/2022   Iron and TIBC    Standing Status:   Standing    Number of Occurrences:   1    Standing Expiration Date:   07/16/2022    All questions were answered. The patient knows to call the clinic with any problems questions or concerns.  cc [name], [name]   I recommend patient to make a follow-up appointment with [place] in 3 months with lab, MD. Patient's daughter prefers not to have appointment date set up today.  Daughter will call office to set up    Earlie Server, MD, PhD [facility] Hematology Oncology 07/16/2021

## 2021-07-21 ENCOUNTER — Ambulatory Visit (INDEPENDENT_AMBULATORY_CARE_PROVIDER_SITE_OTHER): Payer: Medicare Other | Admitting: Adult Health

## 2021-07-21 ENCOUNTER — Encounter: Payer: Self-pay | Admitting: Adult Health

## 2021-07-21 ENCOUNTER — Other Ambulatory Visit: Payer: Self-pay

## 2021-07-21 DIAGNOSIS — I5032 Chronic diastolic (congestive) heart failure: Secondary | ICD-10-CM | POA: Diagnosis not present

## 2021-07-21 DIAGNOSIS — J9611 Chronic respiratory failure with hypoxia: Secondary | ICD-10-CM

## 2021-07-21 DIAGNOSIS — Z01811 Encounter for preprocedural respiratory examination: Secondary | ICD-10-CM | POA: Diagnosis not present

## 2021-07-21 DIAGNOSIS — C3490 Malignant neoplasm of unspecified part of unspecified bronchus or lung: Secondary | ICD-10-CM

## 2021-07-21 DIAGNOSIS — I2609 Other pulmonary embolism with acute cor pulmonale: Secondary | ICD-10-CM

## 2021-07-21 DIAGNOSIS — J449 Chronic obstructive pulmonary disease, unspecified: Secondary | ICD-10-CM

## 2021-07-21 DIAGNOSIS — I272 Pulmonary hypertension, unspecified: Secondary | ICD-10-CM

## 2021-07-21 NOTE — Assessment & Plan Note (Signed)
Patient has upcoming colonoscopy in December 2022 plan for [facility]. Currently patient appears to be stable with multiple COVID abilities including COPD with emphysema and oxygen dependent respiratory failure.  She has a history of PE.  Has known pulmonary hypertension.  All of which appear to be stable. Patient is at a moderate to high surgical risk from a pulmonary standpoint.  She is not excluded from upcoming procedure. Agreed to have procedure done at [place] over potential pulmonary surgical risk.   Major Pulmonary risks identified in the multifactorial risk analysis are but not limited to a) pneumonia; b) recurrent intubation risk; c) prolonged or recurrent acute respiratory failure needing mechanical ventilation; d) prolonged hospitalization; e) DVT/Pulmonary embolism; f) Acute Pulmonary edema  Recommend 1. Short duration of surgery as much as possible and avoid paralytic if possible 2. Aggressive pulmonary toilet with o2, bronchodilatation, and incentive spirometry and early ambulation 3. Will need to hold Eliquis 3 days prior to colonoscopy and resume per post procedure instructions.

## 2021-07-21 NOTE — Telephone Encounter (Signed)
Patient was seen today for surgical clearance and OV notes have been faxed over to Dr. [name] Dorsey's office. Advised to stop Eliquis 3 days prior and to ask at the time of procedure when she should start back. Nothing further needed at this time.

## 2021-07-21 NOTE — Assessment & Plan Note (Signed)
Continue on oxygen 4 L to maintain O2 saturations greater than 88 to 90%.

## 2021-07-21 NOTE — Assessment & Plan Note (Signed)
COPD with emphysema currently compensated on present regimen.  Patient is doing extremely well with her multiple comorbidities and oxygen dependence.  She is going to exercise 2 days a week at the [facility]. No recent exacerbations.  We will continue on current regimen.  Vaccinations with influenza and COVID are up-to-date.  Plan  Patient Instructions  Continue on Trelegy 1 puff daily, rinse after use Continue on Oxygen 4 L Activity as tolerated, Great job with your exercise.  Albuterol inhaler as needed Hold Eliquis 3 days prior to your colonoscopy and resume per post procedure instructions .  Continue follow-up with oncology/hematology Follow-up with Dr. [name] in 4 months and As needed

## 2021-07-21 NOTE — Progress Notes (Signed)
_0  ID: Brianna Adams, female    DOB: 1941/01/15, 80 y.o.   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   TEST/EVENTS :  12/09/19 CT chest:  Moderate centrilobular and paraseptal emphysematous changes, upper lung predominant.  Small mediastinal nodules, including 55m short axis AP window node, stable versus minimally increased from PET. No convincing findings to suggest tumor recurrence.    06/26/20 CT chest- Stable severe emphysematous changes and areas of pulmonary scarring. Progressive consolidation of the lingular process, likely radiation fibrosis. No new/acute pulmonary findings or new pulmonary nodules to suggest pulmonary metastatic disease   Pulmonary function test done in 2019 showed no obvious signs of obstruction or restriction.  Post -bronchodilator FEV1 1.73 (93%), ratio 74.  CT chest Jan 16, 2021 showed stable changes without evidence of local recurrence or metastatic disease, stable radiation fibrosis in the lingula and stable postsurgical changes in the right lower lobe.  Stable enlarged mediastinal and hilar lymph nodes felt to be reactive, multinodular goiter  Completed pulmonary rehab at hospital 2021   07/21/2021 Follow up : COPD, oxygen dependent respiratory failure, history of PE, history of lung cancer Patient returns for 670-monthollow-up.  Patient has underlying COPD with emphysema.  She remains on Trelegy inhaler daily.  Patient says overall breathing is doing okay.  Get short of  breath with heavy activities.  She denies any flare of cough or wheezing.  No recent antibiotics or steroid use. [facility], [facility]. Uses weights and stepper at home. Gets winded but fights through it.  Flu shot is up-to-date.  COVID-vaccine x4 is up-to-date  Patient has a history of lung cancer status post wedge resection 2018.  Most recent CT chest Jan 16, 2021 showed no evidence of local recurrence or metastatic disease.  Patient remains on oxygen 4 L 24/7.  Denies any increased oxygen demands. Does not have to increase with exercise.   Patient has a history of PE in June 2021.  She is on lifelong anticoagulation therapy.  She does have chronic anemia is followed by hematology.  She is on Eliquis 2.5 mg twice daily.  Has had previous rectal bleeding.  Patient has an upcoming colonoscopy in 2 weeks And needs pulmonary preop clearance. [facility]   Allergies  Allergen Reactions   Ace Inhibitors Swelling   Lisinopril Swelling   Penicillin G Hives, Shortness Of Breath and Rash   Atorvastatin Other (See Comments)    Myalgias (Muscle Pain)    Cortisone Other (See Comments)    Unknown reaction   Hydrocortisone Other (See Comments)    Unknown reaction   Meloxicam Other (See Comments)    Bloody stools   Pravastatin Other (See Comments)    Chest Pain if high dosage     Immunization History  Administered Date(s) Administered   Fluad Quad(high Dose 65+) 05/24/2019, 05/13/2020   Influenza, High Dose Seasonal PF 06/01/2021   Influenza-Unspecified 06/06/2018   PFIZER(Purple Top)SARS-COV-2 Vaccination 10/20/2019, 11/19/2019, 06/11/2020   Pfizer Covid-19  Vaccine Bivalent Booster 18yr & up 06/01/2021   Pneumococcal Conjugate-13 10/31/2013   Pneumococcal Polysaccharide-23 12/05/1997, 11/05/2007, 04/07/2015   Td 09/07/2003   Tdap 08/07/2015, 08/17/2017   Zoster Recombinat (Shingrix) 08/17/2017, 06/28/2019, 09/05/2019   Zoster, Live  11/05/2010    Past Medical History:  Diagnosis Date   CHF (congestive heart failure) (HCC)    COPD (chronic obstructive pulmonary disease) (HCC)    Diabetes ([facility]    type 2   Diverticulitis    Dyspnea    On Oxygen 3L via     GERD (gastroesophageal reflux disease)    High cholesterol    Lung cancer ([place]    PE (pulmonary thromboembolism) ([facility] 02/29/2020   Pulmonary hypertension ([facility]    [place]terine cancer ([place]     Tobacco History: Social History   Tobacco Use  Smoking Status Former   Packs/day: 1.00   Years: 58.00   Pack years: 58.00   Types: Cigarettes   Quit date: 10/22/2015   Years since quitting: 5.7  Smokeless Tobacco Never  Tobacco Comments   congratulated   Counseling given: Not Answered Tobacco comments: congratulated   Outpatient Medications Prior to Visit  Medication Sig Dispense Refill   acetaminophen (TYLENOL) 500 MG tablet Take 1,000 mg by mouth every 6 (six) hours as needed for mild pain or fever.     apixaban (ELIQUIS) 2.5 MG TABS tablet Take 1 tablet (2.5 mg total) by mouth 2 (two) times daily. 60 tablet 5   Cinnamon 500 MG TABS Take 1,000 mg by mouth daily.      docusate sodium (COLACE) 100 MG capsule Take 1 capsule (100 mg total) by mouth daily. 60 capsule 1   empagliflozin (JARDIANCE) 10 MG TABS tablet Take 1 tablet (10 mg total) by mouth daily before breakfast. 30 tablet 3   ferrous sulfate 325 (65 FE) MG EC tablet Take 1 tablet (325 mg total) by mouth 3 (three) times daily with meals. 60 tablet 2   Fluticasone-Umeclidin-Vilant (TRELEGY ELLIPTA) 100-62.5-25 MCG/INH AEPB Inhale 100 mcg into the lungs daily. 60 each 5   furosemide (LASIX) 20 MG tablet TAKE 1 TABLET BY MOUTH EVERY DAY 90 tablet 2   metFORMIN (GLUCOPHAGE) 1000 MG tablet Take 1 tablet (1,000 mg total) by mouth 2 (two) times daily with a meal. 180 tablet 1   Multiple Vitamins-Minerals (CENTRUM SILVER 50+WOMEN PO) Take 1 tablet by mouth daily.     Multiple Vitamins-Minerals (OCUVITE EYE  HEALTH FORMULA PO) Take 1 capsule by mouth daily.     omeprazole (PRILOSEC) 40 MG capsule TAKE 1 CAPSULE BY MOUTH EVERY DAY 90 capsule 1   pravastatin (PRAVACHOL) 10 MG tablet TAKE 1 TABLET BY MOUTH EVERY DAY 90 tablet 1   Pseudoephedrine-guaiFENesin (MUCINEX D PO) Take by mouth. nightly     vitamin C (ASCORBIC ACID) 500 MG tablet Take 500 mg by mouth every other day.     vitamin E 1000 UNIT capsule Take 1,000 Units by mouth every Monday, Wednesday, and Friday at 6 PM.     No facility-administered medications prior to visit.     Review of Systems:   Constitutional:   No  weight loss, night sweats,  Fevers, chills,  +fatigue, or  lassitude.  HEENT:   No headaches,  Difficulty swallowing,  Tooth/dental problems, or  Sore throat,                No sneezing, itching, ear ache, nasal congestion, post nasal drip,   CV:  No chest pain,  Orthopnea, PND, swelling in lower extremities, anasarca, dizziness, palpitations, syncope.   GI  No heartburn, indigestion, abdominal pain, nausea, vomiting, diarrhea, change in bowel habits, loss of appetite, bloody stools.   Resp:   No excess mucus, no productive cough,  No non-productive cough,  No coughing up of blood.  No change in color of mucus.  No wheezing.  No chest wall deformity  Skin: no rash or lesions.  GU: no dysuria, change in color of urine, no urgency or frequency.  No flank pain, no hematuria   MS:  No joint pain or swelling.  No decreased range of motion.  No back pain.    Physical Exam  BP 110/60 (BP Location: Right Arm, Patient Position: Sitting, Cuff Size: Normal)   Pulse 100   Ht _0  (1.549 m)   Wt 135 lb 3.2 oz (61.3 kg)   SpO2 96%   BMI 25.55 kg/m   GEN: A/Ox3; pleasant , NAD chronically ill appearing , on o2 in WC    HEENT:  [place]/AT,   NOSE-clear, THROAT-clear, no lesions, no postnasal drip or exudate noted.   NECK:  Supple w/ fair ROM; no JVD; normal carotid impulses w/o bruits; no thyromegaly or nodules palpated;  no lymphadenopathy.    RESP  Clear  P & A; w/o, wheezes/ rales/ or rhonchi. no accessory muscle use, no dullness to percussion  CARD:  RRR, no m/r/g, no peripheral edema, pulses intact, no cyanosis or clubbing.  GI:   Soft & nt; nml bowel sounds; no organomegaly or masses detected.   Musco: Warm bil, no deformities or joint swelling noted.   Neuro: alert, no focal deficits noted.    Skin: Warm, no lesions or rashes    Lab Results:  CBC    Component Value Date/Time   WBC 5.4 06/24/2021 1040   RBC 3.90 06/24/2021 1040   RBC 3.91 06/24/2021 1040   HGB 10.5 (L) 06/24/2021 1040   HGB 9.9 (L) 06/04/2021 1137   HCT 34.2 (L) 06/24/2021 1040   HCT 31.2 (L) 06/04/2021 1137   PLT 348 06/24/2021 1040   PLT 313 06/04/2021 1137   MCV 87.5 06/24/2021 1040   MCV 86 06/04/2021 1137   MCH 26.9 06/24/2021 1040   MCHC 30.7 06/24/2021 1040   RDW 16.1 (H) 06/24/2021 1040   RDW 15.8 (H) 06/04/2021 1137   LYMPHSABS 1.5 06/24/2021 1040   MONOABS 0.4 06/24/2021 1040   EOSABS 0.1 06/24/2021 1040   BASOSABS 0.1 06/24/2021 1040    BMET    Component Value Date/Time   NA 136 05/24/2021 0730   NA 140 03/03/2021 1150   K 4.4 05/24/2021 0730   CL 101 05/24/2021 0730   CO2 26 05/24/2021 0730   GLUCOSE 141 (H) 05/24/2021 0730   BUN 19 05/24/2021 0730   BUN 17 03/03/2021 1150   CREATININE 0.86 05/24/2021 0730   CALCIUM 9.6 05/24/2021 0730   GFRNONAA >60 05/24/2021 0730   GFRAA 81 05/13/2020 1043    BNP    Component Value Date/Time   BNP 341.7 (H) 03/09/2021 1137    ProBNP No results found for: PROBNP  Imaging: No results found.    No flowsheet data found.  No results found for: NITRICOXIDE      Assessment & Plan:   COPD with chronic bronchitis and emphysema ([place]) COPD with emphysema currently compensated on present regimen.  Patient is doing extremely well with her multiple comorbidities and oxygen dependence.  She is going to exercise 2  days a week at the [facility]. No  recent exacerbations.  We will continue on current regimen.  Vaccinations with influenza and COVID are up-to-date.  Plan  Patient Instructions  Continue on Trelegy 1 puff daily, rinse after use Continue on Oxygen 4 L Activity as tolerated, Great job with your exercise.  Albuterol inhaler as needed Hold Eliquis 3 days prior to your colonoscopy and resume per post procedure instructions .  Continue follow-up with oncology/hematology Follow-up with Dr. [name] in 4 months and As needed        Chronic diastolic CHF (congestive heart failure) ([place]) Appears compensated without evidence of volume overload on exam.  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  Patient is on lower dose of Eliquis due to anemia and previous rectal bleeding.  Patient continue on current regimen.  Squamous cell carcinoma lung, unspecified laterality (HCC) History of lung cancer status post right lower lobe resection in May 2018 and presumed lung cancer in the new left upper nodule status post radiation in 2019 Most recent CT chest May 2022 showed no evidence of recurrence or metastatic disease.  Continue follow-up  for serial CT.  Chronic respiratory failure with hypoxia (HCC) Continue on oxygen 4 L to maintain O2 saturations greater than 88 to 90%.  Pulmonary hypertension, unspecified ([place]) Continue follow-up with cardiology.  Continue on oxygen to maintain O2 saturations greater than 88 to 90%. Appears euvolemic on exam.  Encounter for preoperative pulmonary examination Patient has upcoming colonoscopy in December 2022 plan for [facility]. Currently patient appears to be stable with multiple COVID abilities including COPD with emphysema and oxygen dependent respiratory failure.  She has a history of PE.  Has known pulmonary hypertension.  All of which appear to be stable. Patient is at a moderate to high  surgical risk from a pulmonary standpoint.  She is not excluded from upcoming procedure. Agreed to have procedure done at [place] over potential pulmonary surgical risk.   Major Pulmonary risks identified in the multifactorial risk analysis are but not limited to a) pneumonia; b) recurrent intubation risk; c) prolonged or recurrent acute respiratory failure needing mechanical ventilation; d) prolonged hospitalization; e) DVT/Pulmonary embolism; f) Acute Pulmonary edema  Recommend 1. Short duration of surgery as much as possible and avoid paralytic if possible 2. Aggressive pulmonary toilet with o2, bronchodilatation, and incentive spirometry and early ambulation 3. Will need to hold Eliquis 3 days prior to colonoscopy and resume per post procedure instructions.        I spent  40  minutes dedicated to the care of this patient on the date of this encounter to include pre-visit review of records, face-to-face time with the patient discussing conditions above, post visit ordering of testing, clinical documentation with the electronic health record, making appropriate referrals as documented, and communicating necessary findings to members of the patients care team.  Only Rexene Edison, NP 07/21/2021

## 2021-07-21 NOTE — Assessment & Plan Note (Signed)
Continue follow-up with cardiology.  Continue on oxygen to maintain O2 saturations greater than 88 to 90%. Appears euvolemic on exam.

## 2021-07-21 NOTE — Patient Instructions (Addendum)
Continue on Trelegy 1 puff daily, rinse after use Continue on Oxygen 4 L Activity as tolerated, Great job with your exercise.  Albuterol inhaler as needed Hold Eliquis 3 days prior to your colonoscopy and resume per post procedure instructions .  Continue follow-up with oncology/hematology Follow-up with Dr. [name] in 4 months and As needed

## 2021-07-21 NOTE — Assessment & Plan Note (Signed)
Appears compensated without evidence of volume overload on exam.  Continue on current regimen and follow-up with cardiology

## 2021-07-21 NOTE — Assessment & Plan Note (Signed)
History of PE June 2021 on lifelong anticoagulation.  Patient is on lower dose of Eliquis due to anemia and previous rectal bleeding.  Patient continue on current regimen.

## 2021-07-21 NOTE — Assessment & Plan Note (Addendum)
History of lung cancer status post right lower lobe resection in May 2018 and presumed lung cancer in the new left upper nodule status post radiation in 2019 Most recent CT chest May 2022 showed no evidence of recurrence or metastatic disease.  Continue follow-up  for serial CT.

## 2021-07-22 ENCOUNTER — Telehealth: Payer: Self-pay | Admitting: Oncology

## 2021-07-22 NOTE — Telephone Encounter (Signed)
Daughter called to set up appt for pt 2-23. Please give her a call back at [phone]

## 2021-07-23 NOTE — Progress Notes (Signed)
Agree with the details of the visit as noted by Rexene Edison, NP.  Loletha Grayer Derrill Kay, MD [facility]

## 2021-07-23 NOTE — Telephone Encounter (Signed)
Pt scheduled for 2/16 already. Will you reach out to pt to see if maybe she's wanting to r/s?

## 2021-07-24 NOTE — Telephone Encounter (Signed)
LM for patient to return my call for the instructions for holding her Eliquis.

## 2021-08-03 ENCOUNTER — Encounter (HOSPITAL_COMMUNITY): Payer: Self-pay | Admitting: Internal Medicine

## 2021-08-03 NOTE — Progress Notes (Signed)
Attempted to obtain medical history via telephone, unable to reach at this time. Unable to leave voicemail to return pre surgical testing department's phone call, due to mailbox full.

## 2021-08-04 LAB — HM DIABETES EYE EXAM

## 2021-08-05 ENCOUNTER — Encounter: Payer: Self-pay | Admitting: Nurse Practitioner

## 2021-08-05 DIAGNOSIS — E113299 Type 2 diabetes mellitus with mild nonproliferative diabetic retinopathy without macular edema, unspecified eye: Secondary | ICD-10-CM

## 2021-08-05 HISTORY — DX: Type 2 diabetes mellitus with mild nonproliferative diabetic retinopathy without macular edema, unspecified eye: E11.3299

## 2021-08-06 ENCOUNTER — Encounter: Payer: Self-pay | Admitting: Nurse Practitioner

## 2021-08-06 NOTE — Telephone Encounter (Signed)
Inbound call from patient daughter returning call. I advised her to hold Eliquis 3 days before procedure. States understand.

## 2021-08-13 ENCOUNTER — Ambulatory Visit (HOSPITAL_COMMUNITY): Payer: Medicare Other | Admitting: Anesthesiology

## 2021-08-13 ENCOUNTER — Ambulatory Visit (HOSPITAL_COMMUNITY)
Admission: RE | Admit: 2021-08-13 | Discharge: 2021-08-13 | Disposition: A | Discharge status: Home / Self Care | Payer: Medicare Other | Attending: Internal Medicine | Admitting: Internal Medicine

## 2021-08-13 ENCOUNTER — Other Ambulatory Visit: Payer: Self-pay

## 2021-08-13 ENCOUNTER — Encounter (HOSPITAL_COMMUNITY): Payer: Self-pay | Admitting: Internal Medicine

## 2021-08-13 ENCOUNTER — Encounter (HOSPITAL_COMMUNITY)
Admission: RE | Disposition: A | Discharge status: Home / Self Care | Payer: Self-pay | Source: Home / Self Care | Attending: Internal Medicine

## 2021-08-13 DIAGNOSIS — K219 Gastro-esophageal reflux disease without esophagitis: Secondary | ICD-10-CM | POA: Diagnosis not present

## 2021-08-13 DIAGNOSIS — J449 Chronic obstructive pulmonary disease, unspecified: Secondary | ICD-10-CM | POA: Insufficient documentation

## 2021-08-13 DIAGNOSIS — K921 Melena: Secondary | ICD-10-CM

## 2021-08-13 DIAGNOSIS — I509 Heart failure, unspecified: Secondary | ICD-10-CM | POA: Insufficient documentation

## 2021-08-13 DIAGNOSIS — K648 Other hemorrhoids: Secondary | ICD-10-CM | POA: Diagnosis not present

## 2021-08-13 DIAGNOSIS — Z7984 Long term (current) use of oral hypoglycemic drugs: Secondary | ICD-10-CM | POA: Insufficient documentation

## 2021-08-13 DIAGNOSIS — K5730 Diverticulosis of large intestine without perforation or abscess without bleeding: Secondary | ICD-10-CM | POA: Diagnosis not present

## 2021-08-13 DIAGNOSIS — E119 Type 2 diabetes mellitus without complications: Secondary | ICD-10-CM | POA: Diagnosis not present

## 2021-08-13 DIAGNOSIS — I110 Hypertensive heart disease with heart failure: Secondary | ICD-10-CM | POA: Diagnosis not present

## 2021-08-13 DIAGNOSIS — Z87891 Personal history of nicotine dependence: Secondary | ICD-10-CM | POA: Insufficient documentation

## 2021-08-13 DIAGNOSIS — K625 Hemorrhage of anus and rectum: Secondary | ICD-10-CM

## 2021-08-13 HISTORY — PX: FLEXIBLE SIGMOIDOSCOPY: SHX5431

## 2021-08-13 LAB — GLUCOSE, CAPILLARY: Glucose-Capillary: 134 mg/dL — ABNORMAL HIGH (ref 70–99)

## 2021-08-13 SURGERY — SIGMOIDOSCOPY, FLEXIBLE
Anesthesia: Monitor Anesthesia Care

## 2021-08-13 MED ORDER — LACTATED RINGERS IV SOLN: INTRAVENOUS | Status: DC | PRN

## 2021-08-13 MED ORDER — SODIUM CHLORIDE 0.9 % IV SOLN: INTRAVENOUS | Status: DC

## 2021-08-13 MED ORDER — PROPOFOL 500 MG/50ML IV EMUL
INTRAVENOUS | Status: DC | PRN
  Administered 2021-08-13: 30 ug/kg/min via INTRAVENOUS

## 2021-08-13 MED ORDER — HYDROCORTISONE (PERIANAL) 2.5 % EX CREA: 1.0000 "application " | TOPICAL_CREAM | Freq: Two times a day (BID) | CUTANEOUS | 0 refills | Status: AC

## 2021-08-13 MED ORDER — LACTATED RINGERS IV SOLN: INTRAVENOUS | Status: DC

## 2021-08-13 MED ORDER — PROPOFOL 500 MG/50ML IV EMUL
INTRAVENOUS | Status: AC
  Filled 2021-08-13: qty 50

## 2021-08-13 MED ORDER — PROPOFOL 500 MG/50ML IV EMUL
INTRAVENOUS | Status: DC | PRN
  Administered 2021-08-13 (×2): 10 mg via INTRAVENOUS

## 2021-08-13 MED ORDER — GLYCOPYRROLATE PF 0.2 MG/ML IJ SOSY
PREFILLED_SYRINGE | INTRAMUSCULAR | Status: DC | PRN
  Administered 2021-08-13: .1 mg via INTRAVENOUS

## 2021-08-13 MED ORDER — PROPOFOL 10 MG/ML IV BOLUS
INTRAVENOUS | Status: AC
  Filled 2021-08-13: qty 20

## 2021-08-13 MED ORDER — LIDOCAINE 2% (20 MG/ML) 5 ML SYRINGE
INTRAMUSCULAR | Status: DC | PRN
  Administered 2021-08-13: 40 mg via INTRAVENOUS

## 2021-08-13 NOTE — Anesthesia Preprocedure Evaluation (Signed)
Anesthesia Evaluation  Patient identified by MRN, date of birth, ID band Patient awake    Reviewed: Allergy & Precautions, NPO status , Patient's Chart, lab work & pertinent test results  Airway Mallampati: I  TM Distance: >3 FB Neck ROM: Full    Dental  (+) Partial Upper, Dental Advisory Given   Pulmonary COPD, former smoker,     + decreased breath sounds      Cardiovascular hypertension, +CHF   Rhythm:Regular Rate:Normal     Neuro/Psych negative neurological ROS  negative psych ROS   GI/Hepatic Neg liver ROS, GERD  Medicated,  Endo/Other  diabetes, Type 2, Oral Hypoglycemic Agents  Renal/GU      Musculoskeletal negative musculoskeletal ROS (+)   Abdominal Normal abdominal exam  (+)   Peds  Hematology   Anesthesia Other Findings   Reproductive/Obstetrics                             Anesthesia Physical Anesthesia Plan  ASA: 3  Anesthesia Plan: MAC   Post-op Pain Management:    Induction: Intravenous  PONV Risk Score and Plan: 0 and Propofol infusion  Airway Management Planned: Natural Airway and Simple Face Mask  Additional Equipment: None  Intra-op Plan:   Post-operative Plan:   Informed Consent: I have reviewed the patients History and Physical, chart, labs and discussed the procedure including the risks, benefits and alternatives for the proposed anesthesia with the patient or authorized representative who has indicated his/her understanding and acceptance.       Plan Discussed with: CRNA  Anesthesia Plan Comments:         Anesthesia Quick Evaluation

## 2021-08-13 NOTE — H&P (Signed)
GASTROENTEROLOGY PROCEDURE H&P NOTE   Primary Care Physician: [name], [name]    Reason for Procedure:   Rectal bleeding  Plan:    Flexible sigmoidoscopy  Patient is appropriate for endoscopic procedure(s) in the hospital setting.  The nature of the procedure, as well as the risks, benefits, and alternatives were carefully and thoroughly reviewed with the patient. Ample time for discussion and questions allowed. The patient understood, was satisfied, and agreed to proceed.     HPI: Brianna Adams is a 80 y.o. female who presents for flexible sigmoidoscopy for rectal bleeding.  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   PE (pulmonary thromboembolism) ([place]) 02/29/2020   Pulmonary hypertension ([place])    Uterine cancer [facility])     Past Surgical History:  Procedure Laterality Date   ABDOMINAL HYSTERECTOMY     cataract surgery  2012 and 2017   COLONOSCOPY     FIBEROPTIC BRONCHOSCOPY     GANGLION CYST EXCISION Left 09/11/2020   Procedure: EXCISION VOLAR RADIAL GANGLION OF LEFT WRIST;  Surgeon: Daryll Brod, MD;  Location: [place];  Service: Orthopedics;  Laterality: Left;  AXILLARY BLOCK   resection of lung cancer  2018   right lower lobe non-anatomocal lung resection wedge     right thorascoscopy      Prior to Admission medications   Medication Sig Start Date End Date Taking? Authorizing Provider  acetaminophen (TYLENOL) 500 MG tablet Take 1,000 mg by mouth every 6 (six) hours as needed for mild pain or fever. 01/30/17  Yes [provider]  apixaban (ELIQUIS) 2.5 MG TABS tablet Take 1 tablet (2.5 mg total) by mouth 2 (two) times daily. 06/04/21  Yes  [name], [name]  CALCIUM PO Take 1,200 mg by mouth daily.   Yes [provider]  Cinnamon 500 MG TABS Take 1,000 mg by mouth daily.    Yes [provider]  docusate sodium (COLACE) 100 MG capsule Take 1 capsule (100 mg total) by mouth daily. 07/16/21  Yes Earlie Server, MD  empagliflozin (JARDIANCE) 10 MG TABS tablet Take 1 tablet (10 mg total) by mouth daily before breakfast. 04/30/21  Yes [name], [name]  ferrous sulfate 325 (65 FE) MG EC tablet Take 1 tablet (325 mg total) by mouth 3 (three) times daily with meals. Patient taking differently: Take 325 mg by mouth in the morning and at bedtime. 06/24/21  Yes Earlie Server, MD  Fluticasone-Umeclidin-Vilant (TRELEGY ELLIPTA) 100-62.5-25 MCG/INH AEPB Inhale 100 mcg into the lungs daily. 02/20/21  Yes Martyn Ehrich, NP  furosemide (LASIX) 20 MG tablet TAKE 1 TABLET BY MOUTH EVERY DAY 03/10/21  Yes [name], [name]  metFORMIN (GLUCOPHAGE) 1000 MG tablet Take 1 tablet (1,000 mg total) by mouth 2 (two) times daily with a meal. 07/16/21 07/16/22 Yes [name], [name]  Multiple Vitamins-Minerals (CENTRUM SILVER 50+WOMEN PO) Take 1 tablet by mouth daily.   Yes [provider]  Multiple Vitamins-Minerals (OCUVITE EYE HEALTH FORMULA PO) Take 1 capsule by mouth daily.   Yes [provider]  omeprazole (PRILOSEC) 40 MG capsule TAKE 1 CAPSULE BY MOUTH  EVERY DAY 07/17/21  Yes [name], [name]  pravastatin (PRAVACHOL) 10 MG tablet TAKE 1 TABLET BY MOUTH EVERY DAY 07/08/21  Yes [name], [name]  Pseudoephedrine-guaiFENesin [facility] D PO) Take by mouth. nightly   Yes [provider]  vitamin C (ASCORBIC ACID) 500 MG tablet Take 500 mg by mouth every other day.   Yes [provider]  vitamin E 180 MG (400 UNITS) capsule Take 400 Units by mouth every Monday, Wednesday, and Friday.   Yes [provider]    Current Facility-Administered Medications  Medication Dose Route Frequency Provider Last Rate  Last Admin   0.9 %  sodium chloride infusion   Intravenous Continuous Sharyn Creamer, MD        Allergies as of 06/24/2021 - Review Complete 06/24/2021  Allergen Reaction Noted   Ace inhibitors Swelling 03/29/2018   Lisinopril Swelling 09/18/2019   Penicillin g Hives, Shortness Of Breath, and Rash 09/18/2019   Atorvastatin Other (See Comments) 03/29/2018   Cortisone Other (See Comments) 12/16/2016   Hydrocortisone Other (See Comments) 03/29/2018   Meloxicam Other (See Comments) 11/19/2019   Pravastatin Other (See Comments) 09/18/2019    Family History  Problem Relation Age of Onset   Hypertension Mother    Heart attack Father    Cancer Maternal Aunt        unknown type   Stomach cancer Neg Hx    Colon cancer Neg Hx    Esophageal cancer Neg Hx    Pancreatic cancer Neg Hx     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r-sr Pharmacist, hospital  Tobacco Use   Smoking status: Former    Packs/day: 1.00    Years: 58.00    Pack years: 58.00    Types: Cigarettes    Quit date: 10/22/2015    Years since quitting: 5.8   Smokeless tobacco: Never   Tobacco comments:    Warehouse manager Use: Never used  Substance and Sexual Activity   Alcohol use: Never   Drug use: Never   Sexual activity: Not Currently    Birth control/protection: Surgical    Comment: HYSTERECTOMY  Other Topics Concern   Not on file  Social History Narrative   Not on file   Social Determinants of Health   Financial Resource Strain: Low Risk    Difficulty of Paying Living Expenses: Not hard at all  Food Insecurity: No Food Insecurity   Worried About Charity fundraiser in the Last Year: Never true   [place] in the Last Year: Never true   Transportation Needs: No Transportation Needs   Lack of Transportation (Medical): No   Lack of Transportation (Non-Medical): No  Physical Activity: Insufficiently Active   Days of Exercise per Week: 5 days   Minutes of Exercise per Session: 20 min  Stress: No Stress Concern Present   Feeling of Stress : Not at all  Social Connections: Not on file  Intimate Partner Violence: Not on file    Physical Exam: Vital signs in last 24 hours: BP (!) 122/59   Pulse 68   Temp 97.8 F (36.6 C) (Oral)   Resp (!) 23  Ht _0  (1.575 m)   Wt 61.7 kg   SpO2 100%   BMI 24.87 kg/m  GEN: NAD EYE: Sclerae anicteric ENT: MMM CV: Non-tachycardic Pulm: No increased work of breathing GI: Soft, NT/ND NEURO:  Alert & Oriented   Christia Reading, MD [facility]  08/13/2021 11:17 AM

## 2021-08-13 NOTE — Op Note (Signed)
[facility] Patient Name: Brianna Adams Procedure Date: 08/13/2021 MRN: [id number] Attending MD: Georgian Co ,  Date of Birth: 09-17-1940 CSN: [id number] Age: 80 Admit Type: Outpatient Procedure:                Flexible Sigmoidoscopy Indications:              Hematochezia Providers:                Shelly Coss, RN, Benetta Spar, Technician Referring MD:             [name] Medicines:                Monitored Anesthesia Care Complications:            No immediate complications. Estimated Blood Loss:     Estimated blood loss: none. Procedure:                Pre-Anesthesia Assessment:                           - Prior to the procedure, a History and Physical                            was performed, and patient medications and                            allergies were reviewed. The patient's tolerance of                            previous anesthesia was also reviewed. The risks                            and benefits of the procedure and the sedation                            options and risks were discussed with the patient.                            All questions were answered, and informed consent                            was obtained. Prior Anticoagulants: The patient has                            taken no previous anticoagulant or antiplatelet                            agents. ASA Grade Assessment: III - A patient with                            severe systemic disease. After reviewing the risks                            and  benefits, the patient was deemed in                            satisfactory condition to undergo the procedure.                           After obtaining informed consent, the scope was                            passed under direct vision. The [id number]-[id number] ([id number])                            Olympus endoscope was introduced through the anus                            and  advanced to the the left transverse colon. The                            flexible sigmoidoscopy was accomplished without                            difficulty. The patient tolerated the procedure                            well. Scope In: Scope Out: Findings:      Multiple small and large-mouthed diverticula were found in the sigmoid       colon and descending colon.      Non-bleeding internal hemorrhoids were found during retroflexion. Impression:               - Diverticulosis in the sigmoid colon and in the                            descending colon.                           - Non-bleeding internal hemorrhoids.                           - No specimens collected. Moderate Sedation:      Not Applicable - Patient had care per Anesthesia. Recommendation:           - Discharge patient to home (with escort).                           - It is suspected that your rectal bleeding is due                            to hemorrhoids.                           - For pain and bleeding due to your hemorrhoids,                            you can use Anusol HC twice daily for 7 days and  can use Recticare as needed.                           - The findings and recommendations were discussed                            with the patient. Procedure Code(s):        --- Professional ---                           [id number], Sigmoidoscopy, flexible; diagnostic,                            including collection of specimen(s) by brushing or                            washing, when performed (separate procedure) Diagnosis Code(s):        --- Professional ---                           K64.8, Other hemorrhoids                           K92.1, Melena (includes Hematochezia)                           K57.30, Diverticulosis of large intestine without                            perforation or abscess without bleeding CPT copyright 2019 American Medical Association. All rights reserved. The  codes documented in this report are preliminary and upon coder review may  be revised to meet current compliance requirements. Brianna Adams "Brianna Adams,  08/13/2021 12:19:03 PM Number of Addenda: 0

## 2021-08-13 NOTE — Transfer of Care (Signed)
Immediate Anesthesia Transfer of Care Note  Patient: Brianna Adams  Procedure(s) Performed: FLEXIBLE SIGMOIDOSCOPY  Patient Location: Endoscopy Unit  Anesthesia Type:MAC  Level of Consciousness: awake and patient cooperative  Airway & Oxygen Therapy: Patient Spontanous Breathing and Patient connected to face mask oxygen  Post-op Assessment: Report given to RN and Post -op Vital signs reviewed and stable  Post vital signs: Reviewed and stable  Last Vitals:  Vitals Value Taken Time  BP 110/54 08/13/21 1217  Temp    Pulse 67 08/13/21 1219  Resp 20 08/13/21 1219  SpO2 99 % 08/13/21 1219  Vitals shown include unvalidated device data.  Last Pain:  Vitals:   08/13/21 1052  TempSrc: Oral  PainSc: 0-No pain         Complications: No notable events documented.

## 2021-08-13 NOTE — Anesthesia Postprocedure Evaluation (Signed)
Anesthesia Post Note  Patient: Brianna Adams  Procedure(s) Performed: [place]     Patient location during evaluation: PACU Anesthesia Type: MAC Level of consciousness: awake and alert Pain management: pain level controlled Vital Signs Assessment: post-procedure vital signs reviewed and stable Respiratory status: spontaneous breathing, nonlabored ventilation, respiratory function stable and patient connected to nasal cannula oxygen Cardiovascular status: stable and blood pressure returned to baseline Postop Assessment: no apparent nausea or vomiting Anesthetic complications: no   No notable events documented.  Last Vitals:  Vitals:   08/13/21 1230 08/13/21 1231  BP: (!) 123/58 (!) 123/58  Pulse: 67 68  Resp: 17 (!) 21  Temp:    SpO2: 99% 99%    Last Pain:  Vitals:   08/13/21 1225  TempSrc: Oral  PainSc:                  Effie Berkshire

## 2021-08-14 ENCOUNTER — Encounter (HOSPITAL_COMMUNITY): Payer: Self-pay | Admitting: Internal Medicine

## 2021-09-01 ENCOUNTER — Other Ambulatory Visit: Payer: Self-pay

## 2021-09-01 ENCOUNTER — Ambulatory Visit (INDEPENDENT_AMBULATORY_CARE_PROVIDER_SITE_OTHER): Payer: Medicare Other | Admitting: Nurse Practitioner

## 2021-09-01 ENCOUNTER — Other Ambulatory Visit: Payer: Self-pay | Admitting: Nurse Practitioner

## 2021-09-01 ENCOUNTER — Ambulatory Visit
Admission: RE | Admit: 2021-09-01 | Discharge: 2021-09-01 | Disposition: A | Discharge status: Home / Self Care | Payer: Medicare Other | Source: Ambulatory Visit | Attending: Nurse Practitioner | Admitting: Nurse Practitioner

## 2021-09-01 DIAGNOSIS — R0602 Shortness of breath: Secondary | ICD-10-CM | POA: Diagnosis not present

## 2021-09-01 DIAGNOSIS — R0981 Nasal congestion: Secondary | ICD-10-CM | POA: Diagnosis not present

## 2021-09-01 DIAGNOSIS — R051 Acute cough: Secondary | ICD-10-CM | POA: Diagnosis not present

## 2021-09-01 LAB — POC INFLUENZA A&B (BINAX/QUICKVUE)
Influenza A, POC: NEGATIVE
Influenza B, POC: NEGATIVE

## 2021-09-01 LAB — POC COVID19 BINAXNOW: SARS Coronavirus 2 Ag: NEGATIVE

## 2021-09-01 MED ORDER — ALBUTEROL SULFATE HFA 108 (90 BASE) MCG/ACT IN AERS: 2.0000 | INHALATION_SPRAY | Freq: Four times a day (QID) | RESPIRATORY_TRACT | 2 refills | Status: DC | PRN

## 2021-09-01 MED ORDER — DOXYCYCLINE HYCLATE 100 MG PO TABS: 100.0000 mg | ORAL_TABLET | Freq: Two times a day (BID) | ORAL | 0 refills | Status: DC

## 2021-09-01 NOTE — Patient Instructions (Signed)
Cough, Adult A cough helps to clear your throat and lungs. A cough may be a sign of an illness or another medical condition. An acute cough may only last 2-3 weeks, while a chronic cough may last 8 or more weeks. Many things can cause a cough. They include: Germs (viruses or bacteria) that attack the airway. Breathing in things that bother (irritate) your lungs. Allergies. Asthma. Mucus that runs down the back of your throat (postnasal drip). Smoking. Acid backing up from the stomach into the tube that moves food from the mouth to the stomach (gastroesophageal reflux). Some medicines. Lung problems. Other medical conditions, such as heart failure or a blood clot in the lung (pulmonary embolism). Follow these instructions at home: Medicines Take over-the-counter and prescription medicines only as told by your doctor. Talk with your doctor before you take medicines that stop a cough (cough suppressants). Lifestyle  Do not smoke, and try not to be around smoke. Do not use any products that contain nicotine or tobacco, such as cigarettes, e-cigarettes, and chewing tobacco. If you need help quitting, ask your doctor. Drink enough fluid to keep your pee (urine) pale yellow. Avoid caffeine. Do not drink alcohol if your doctor tells you not to drink. General instructions  Watch for any changes in your cough. Tell your doctor about them. Always cover your mouth when you cough. Stay away from things that make you cough, such as perfume, candles, campfire smoke, or cleaning products. If the air is dry, use a cool mist vaporizer or humidifier in your home. If your cough is worse at night, try using extra pillows to raise your head up higher while you sleep. Rest as needed. Keep all follow-up visits as told by your doctor. This is important. Contact a doctor if: You have new symptoms. You cough up pus. Your cough does not get better after 2-3 weeks, or your cough gets worse. Cough medicine  does not help your cough and you are not sleeping well. You have pain that gets worse or pain that is not helped with medicine. You have a fever. You are losing weight and you do not know why. You have night sweats. Get help right away if: You cough up blood. You have trouble breathing. Your heartbeat is very fast. These symptoms may be an emergency. Do not wait to see if the symptoms will go away. Get medical help right away. Call your local emergency services (911 in the [place] Do not drive yourself to the hospital. Summary A cough helps to clear your throat and lungs. Many things can cause a cough. Take over-the-counter and prescription medicines only as told by your doctor. Always cover your mouth when you cough. Contact a doctor if you have new symptoms or you have a cough that does not get better or gets worse. This information is not intended to replace advice given to you by your health care provider. Make sure you discuss any questions you have with your health care provider. Document Revised: 10/12/2019 Document Reviewed: 09/11/2018 Elsevier Patient Education  [place].

## 2021-09-01 NOTE — Progress Notes (Signed)
I,[name],acting as a Education administrator for [employer], [name].,have documented all relevant documentation on the behalf of [name], [name],as directed by  [name], [name] while in the presence of [name], [place].  This visit occurred during the SARS-CoV-2 public health emergency.  Safety protocols were in place, including screening questions prior to the visit, additional usage of staff PPE, and extensive cleaning of exam room while observing appropriate contact time as indicated for disinfecting solutions.  Subjective:     Patient ID: Brianna Adams , female    DOB: 05-Jan-1941 , 80 y.o.   MRN: [id number]   Chief Complaint  Patient presents with   Nasal Congestion    HPI  Patient is here for cough and congestion. Patient was seen outside. Denies having a fever, just has not felt well. She has had intermittent shortness of breath even with her oxygen.     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   PE (pulmonary thromboembolism) ([place]) 02/29/2020   Pulmonary hypertension ([place])    Uterine cancer ([place])      Family History  Problem Relation Age of Onset   Hypertension Mother    Heart attack Father    Cancer Maternal Aunt        unknown type   Stomach cancer Neg Hx    Colon cancer Neg Hx    Esophageal cancer Neg Hx    Pancreatic cancer Neg Hx      Current Outpatient Medications:    albuterol (VENTOLIN HFA) 108 (90 Base) MCG/ACT inhaler, Inhale 2 puffs into the lungs every 6 (six) hours as needed for wheezing or shortness of breath., Disp: 8 g, Rfl: 2   doxycycline (VIBRA-TABS) 100 MG tablet, Take 1 tablet (100 mg total) by mouth 2 (two) times daily., Disp: 20  tablet, Rfl: 0   acetaminophen (TYLENOL) 500 MG tablet, Take 1,000 mg by mouth every 6 (six) hours as needed for mild pain or fever., Disp: , Rfl:    apixaban (ELIQUIS) 2.5 MG TABS tablet, Take 1 tablet (2.5 mg total) by mouth 2 (two) times daily., Disp: 60 tablet, Rfl: 5   CALCIUM PO, Take 1,200 mg by mouth daily., Disp: , Rfl:    Cinnamon 500 MG TABS, Take 1,000 mg by mouth daily. , Disp: , Rfl:    docusate sodium (COLACE) 100 MG capsule, Take 1 capsule (100 mg total) by mouth daily., Disp: 60 capsule, Rfl: 1   ferrous sulfate 325 (65 FE) MG EC tablet, Take 1 tablet (325 mg total) by mouth 3 (three) times daily with meals. (Patient taking differently: Take 325 mg by mouth in the morning and at bedtime.), Disp: 60 tablet, Rfl: 2   Fluticasone-Umeclidin-Vilant (TRELEGY ELLIPTA) 100-62.5-25 MCG/ACT AEPB, INHALE 1 PUFF INTO THE LUNGS DAILY, Disp: 60 each, Rfl: 2   furosemide (LASIX) 20 MG tablet, TAKE 1 TABLET BY MOUTH EVERY DAY, Disp: 90 tablet, Rfl: 2   JARDIANCE 10 MG TABS tablet, TAKE 1 TABLET BY MOUTH DAILY BEFORE BREAKFAST., Disp: 30 tablet, Rfl: 3   metFORMIN (GLUCOPHAGE) 1000 MG tablet, Take 1 tablet (1,000 mg total) by  mouth 2 (two) times daily with a meal., Disp: 180 tablet, Rfl: 1   Multiple Vitamins-Minerals (CENTRUM SILVER 50+WOMEN PO), Take 1 tablet by mouth daily., Disp: , Rfl:    Multiple Vitamins-Minerals (OCUVITE EYE HEALTH FORMULA PO), Take 1 capsule by mouth daily., Disp: , Rfl:    omeprazole (PRILOSEC) 40 MG capsule, TAKE 1 CAPSULE BY MOUTH EVERY DAY, Disp: 90 capsule, Rfl: 1   pravastatin (PRAVACHOL) 10 MG tablet, TAKE 1 TABLET BY MOUTH EVERY DAY, Disp: 90 tablet, Rfl: 1   Pseudoephedrine-guaiFENesin (MUCINEX D PO), Take by mouth. nightly, Disp: , Rfl:    vitamin C (ASCORBIC ACID) 500 MG tablet, Take 500 mg by mouth every other day., Disp: , Rfl:    vitamin E 180 MG (400 UNITS) capsule, Take 400 Units by mouth every Monday, Wednesday, and Friday., Disp: , Rfl:    Allergies   Allergen Reactions   Ace Inhibitors Swelling   Lisinopril Swelling   Penicillin G Hives, Shortness Of Breath and Rash   Atorvastatin Other (See Comments)    Myalgias (Muscle Pain)    Cortisone Other (See Comments)    Unknown reaction   Hydrocortisone Other (See Comments)    Unknown reaction   Meloxicam Other (See Comments)    Bloody stools   Pravastatin Other (See Comments)    Chest Pain if high dosage      Review of Systems  Constitutional: Negative.   HENT:  Positive for congestion.   Respiratory:  Positive for cough and shortness of breath.   Cardiovascular: Negative.   Gastrointestinal: Negative.   Neurological: Negative.   Psychiatric/Behavioral: Negative.      There were no vitals filed for this visit. There is no height or weight on file to calculate BMI.   Objective:  Physical Exam Vitals reviewed.  Constitutional:      General: She is not in acute distress.    Appearance: Normal appearance.  Cardiovascular:     Rate and Rhythm: Normal rate and regular rhythm.     Pulses: Normal pulses.     Heart sounds: Normal heart sounds. No murmur heard. Pulmonary:     Effort: Respiratory distress (mild dyspnea noted when talking) present.     Breath sounds: Normal breath sounds. No wheezing.  Neurological:     General: No focal deficit present.     Mental Status: She is alert and oriented to person, place, and time.     Cranial Nerves: No cranial nerve deficit.     Motor: No weakness.  Psychiatric:        Mood and Affect: Mood normal.        Behavior: Behavior normal.        Thought Content: Thought content normal.        Judgment: Judgment normal.        Assessment And Plan:     1. Nasal congestion Comments: negative rapid covid and Influenza A/B.  - POC COVID-19 - POC Influenza A&B (Binax test)  2. Acute cough Comments: Will send Rx for albuterol inhaler and doxycycline.  - POC COVID-19 - POC Influenza A&B (Binax test) - Novel Coronavirus, NAA  ([facility]) - DG Chest 2 View; Future  3. Shortness of breath Comments: Will check CXR due to history of pneumonia in the past and having shortness of breath.  - DG Chest 2 View; Future     Patient was given opportunity to ask questions. Patient verbalized understanding of the plan and was able to repeat key elements of  the plan. All questions were answered to their satisfaction.  [name], [name]   I, [name], [name], have reviewed all documentation for this visit. The documentation on 09/01/21 for the exam, diagnosis, procedures, and orders are all accurate and complete.   IF YOU HAVE BEEN REFERRED TO A SPECIALIST, IT MAY TAKE 1-2 WEEKS TO SCHEDULE/PROCESS THE REFERRAL. IF YOU HAVE NOT HEARD FROM US/SPECIALIST IN TWO WEEKS, PLEASE GIVE [place] A CALL AT [phone] X [phone].   THE PATIENT IS ENCOURAGED TO PRACTICE SOCIAL DISTANCING DUE TO THE COVID-19 PANDEMIC.

## 2021-09-02 LAB — NOVEL CORONAVIRUS, NAA: SARS-CoV-2, NAA: NOT DETECTED

## 2021-09-02 LAB — SARS-COV-2, NAA 2 DAY TAT

## 2021-09-07 NOTE — Progress Notes (Signed)
Cardiology Office Note:    Date:  09/09/2021   ID:  Brianna Adams, DOB 1940-09-16, MRN [id number]  PCP:  [name], [place]  Cardiologist:  None  Electrophysiologist:  None   Referring MD: [name], [name]   Chief Complaint  Patient presents with   Follow-up    3 months.    History of Present Illness:    Brianna Adams is a 81 y.o. female with a hx of chronic diastolic heart failure, COPD, PE on Eliquis, T2DM who presents for follow-up.  She was referred by [name], [name] for evaluation of heart failure, initially seen on 01/08/2021.  She was admitted from 59/5 through 12/10/2020.  She presented with worsening shortness of breath.  Had to increase her home oxygen from 3 to 4 L.  Reports she had not been taking her Lasix regularly.  Chest x-ray showed pulmonary edema, BNP 24.  Echocardiogram 12/10/2020 showed LVEF 55 to 35%, grade 1 diastolic dysfunction, moderate RV enlargement, severe pulmonary hypertension (RVSP 85 mmHg).  She received IV diuresis with improvement in symptoms and was discharged on Lasix 20 mg daily.  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  Follows with pulmonology.  Since last clinic visit, she reports that she has been doing okay.  States that her shortness of breath has been stable.  Denies any shortness of breath as long as she is on her oxygen.  She denies any chest pain, lightheadedness, syncope, lower extremity edema, or palpitations.  She has been taking Lasix 20 mg daily.  Weight is down 14 pounds from last clinic visit.   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   PE (pulmonary thromboembolism) ([place]) 02/29/2020   Pulmonary hypertension ([place])    Uterine cancer [facility])     Past Surgical History:  Procedure Laterality Date   ABDOMINAL HYSTERECTOMY     cataract surgery  2012 and 2017   COLONOSCOPY     FIBEROPTIC BRONCHOSCOPY     FLEXIBLE SIGMOIDOSCOPY N/A 08/13/2021   Procedure: FLEXIBLE SIGMOIDOSCOPY;  Surgeon: Sharyn Creamer, MD;  Location: [facility];  Service: Gastroenterology;  Laterality: N/A;   GANGLION CYST EXCISION Left 09/11/2020   Procedure: EXCISION VOLAR RADIAL GANGLION OF LEFT WRIST;  Surgeon: Daryll Brod, MD;  Location: [facility];  Service: Orthopedics;  Laterality: Left;  AXILLARY BLOCK   resection of lung cancer  2018   right lower lobe non-anatomocal lung resection wedge     right thorascoscopy      Current Medications: Current Meds  Medication Sig   acetaminophen (TYLENOL) 500 MG tablet Take 1,000 mg by mouth every 6 (six) hours as needed for mild pain or fever.   albuterol (VENTOLIN HFA) 108 (90 Base) MCG/ACT inhaler Inhale 2 puffs into the lungs every 6 (six) hours as needed for wheezing or shortness of breath.   apixaban (ELIQUIS) 2.5 MG TABS tablet Take 1 tablet (2.5 mg total) by  mouth 2 (two) times daily.   CALCIUM PO Take 1,200 mg by mouth daily.   Cinnamon 500 MG TABS Take 1,000 mg by mouth daily.    docusate sodium (COLACE) 100 MG capsule Take 1 capsule (100 mg total) by mouth daily.   doxycycline (VIBRA-TABS) 100 MG tablet Take 1 tablet (100 mg total) by mouth 2 (two) times daily.   ferrous sulfate 325 (65 FE) MG EC tablet Take 1 tablet (325 mg total) by mouth 3 (three) times daily with meals. (Patient taking differently: Take 325 mg by mouth in the morning and at bedtime.)   Fluticasone-Umeclidin-Vilant (TRELEGY ELLIPTA) 100-62.5-25 MCG/INH AEPB Inhale 100 mcg into the lungs daily.   furosemide (LASIX) 20 MG tablet TAKE 1 TABLET BY MOUTH EVERY DAY    JARDIANCE 10 MG TABS tablet TAKE 1 TABLET BY MOUTH DAILY BEFORE BREAKFAST.   metFORMIN (GLUCOPHAGE) 1000 MG tablet Take 1 tablet (1,000 mg total) by mouth 2 (two) times daily with a meal.   Multiple Vitamins-Minerals (CENTRUM SILVER 50+WOMEN PO) Take 1 tablet by mouth daily.   Multiple Vitamins-Minerals (OCUVITE EYE HEALTH FORMULA PO) Take 1 capsule by mouth daily.   omeprazole (PRILOSEC) 40 MG capsule TAKE 1 CAPSULE BY MOUTH EVERY DAY   pravastatin (PRAVACHOL) 10 MG tablet TAKE 1 TABLET BY MOUTH EVERY DAY   Pseudoephedrine-guaiFENesin (MUCINEX D PO) Take by mouth. nightly   vitamin C (ASCORBIC ACID) 500 MG tablet Take 500 mg by mouth every other day.   vitamin E 180 MG (400 UNITS) capsule Take 400 Units by mouth every Monday, Wednesday, and Friday.     Allergies:   Ace inhibitors, Lisinopril, Penicillin g, Atorvastatin, Cortisone, Hydrocortisone, Meloxicam, and Pravastatin   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r-sr Pharmacist, hospital  Tobacco Use   Smoking status: Former    Packs/day: 1.00    Years: 58.00    Pack years: 58.00    Types: Cigarettes    Quit date: 10/22/2015    Years since quitting: 5.8   Smokeless tobacco: Never   Tobacco comments:    Warehouse manager Use: Never used  Substance and Sexual Activity   Alcohol use: Never   Drug use: Never   Sexual activity: Not Currently    Birth control/protection: Surgical    Comment: HYSTERECTOMY  Other Topics Concern   Not on file  Social History Narrative   Not on file   Social Determinants of Health   Financial Resource Strain: Low Risk    Difficulty of Paying Living Expenses: Not hard at all  Food Insecurity: No Food Insecurity   Worried About Paediatric nurse in the Last Year: Never true   [place] in the Last Year: Never true  Transportation Needs: No Transportation Needs   Lack of Transportation (Medical): No   Lack of Transportation (Non-Medical): No  Physical Activity: Insufficiently Active   Days of Exercise per Week: 5 days   Minutes of Exercise per Session: 20 min  Stress: No Stress Concern Present   Feeling of Stress : Not at all  Social Connections: Not on file     Family History: The patient's family history includes  Cancer in her maternal aunt; Heart attack in her father; Hypertension in her mother. There is no history of Stomach cancer, Colon cancer, Esophageal cancer, or Pancreatic cancer.  ROS:   Please see the history of present illness.    All other systems reviewed and are negative.  EKGs/Labs/Other Studies Reviewed:    The following studies were reviewed today:   EKG:   01/08/21: Sinus rhythm. Rate 75 bpm. No ST abnormalities, Left atrial enlargement  Recent Labs: 03/03/2021: Magnesium 2.3 03/09/2021: B Natriuretic Peptide 341.7 05/24/2021: ALT 12; BUN 19; Creatinine, Ser 0.86; Potassium 4.4; Sodium 136 06/24/2021: Hemoglobin 10.5; Platelets 348  Recent Lipid Panel    Component Value Date/Time   CHOL 151 03/10/2021 0620   CHOL 189 03/03/2021 1150   TRIG 80 03/10/2021 0620   HDL 51 03/10/2021 0620   HDL 74 03/03/2021 1150   CHOLHDL 3.0 03/10/2021 0620   VLDL 16 03/10/2021 0620   LDLCALC 84 03/10/2021 0620   LDLCALC 102 (H) 03/03/2021 1150    Physical Exam:    VS:  BP (!) 116/58 (BP Location: Left Arm, Patient Position: Sitting, Cuff Size: Normal)    Pulse 92    Ht _0  (1.549 m)    Wt 131 lb (59.4 kg)    BMI 24.75 kg/m     Wt Readings from Last 3 Encounters:  09/09/21 131 lb (59.4 kg)  08/13/21 136 lb (61.7 kg)  07/21/21 135 lb 3.2 oz (61.3 kg)     GEN: Well nourished, well developed in no acute distress HEENT: Normal NECK: No JVD; No carotid bruits LYMPHATICS: No  lymphadenopathy CARDIAC: RRR, no murmurs, rubs, gallops RESPIRATORY:  Clear to auscultation without rales, wheezing or rhonchi  ABDOMEN: Soft, non-tender, non-distended MUSCULOSKELETAL:  No edema; No deformity  SKIN: Warm and dry NEUROLOGIC:  Alert and oriented x 3 PSYCHIATRIC:  Normal affect   ASSESSMENT:    1. Pulmonary HTN ([place])   2. Chronic diastolic heart failure ([place])   3. Hyperlipidemia, unspecified hyperlipidemia type     PLAN:    Chronic diastolic heart failure: EF 12/2020 with normal EF, moderately enlarged RV, normal RV function, severe pulmonary hypertension -Continue Lasix 20 mg daily.  Will check BMP, magnesium -Continue Jardiance 10 mg daily  Pulmonary hypertension: RVSP 85 mmHg on echo 12/2020.  Likely combination of group 2 and group 3 PH.  Also could potentially be group 4 given history of PE.  Follows with pulmonology -Weight is down 14 pounds from last clinic visit.  She appears euvolemic.  Will repeat echocardiogram to assess pulmonary pressures with improvement in volume status.  Hyperlipidemia: On pravastatin 10 mg daily.  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  On 2.5 mg dose Eliquis due to anemia and issues with rectal bleeding  RTC in 6 months   Medication Adjustments/Labs and Tests Ordered: Current medicines are reviewed at length with the patient today.  Concerns regarding medicines are outlined above.  Orders Placed This Encounter  Procedures   Basic metabolic panel   Magnesium   ECHOCARDIOGRAM COMPLETE   No orders of the defined types were placed in this encounter.   Patient Instructions  Medication Instructions:  The current medical regimen is effective;  continue present plan and medications.  *If you need a refill on your cardiac medications before your next appointment, please call your pharmacy*   Lab Work: BMET, MAG today   If you have labs (blood work) drawn today and your tests are completely normal, you  will  receive your results only by: [place] (if you have MyChart) OR A paper copy in the mail If you have any lab test that is abnormal or we need to change your treatment, we will call you to review the results.   Testing/Procedures: Echocardiogram - Your physician has requested that you have an echocardiogram. Echocardiography is a painless test that uses sound waves to create images of your heart. It provides your doctor with information about the size and shape of your heart and how well your hearts chambers and valves are working. This procedure takes approximately one hour. There are no restrictions for this procedure. This will be performed at either our [facility] location - [address], [place] location [facility] 2nd floor.    Follow-Up: At [facility], you and your health needs are our priority.  As part of our continuing mission to provide you with exceptional heart care, we have created designated Provider Care Teams.  These Care Teams include your primary Cardiologist (physician) and Advanced Practice Providers (APPs -  Physician Assistants and Nurse Practitioners) who all work together to provide you with the care you need, when you need it.  We recommend signing up for the patient portal called "MyChart".  Sign up information is provided on this After Visit Summary.  MyChart is used to connect with patients for Virtual Visits (Telemedicine).  Patients are able to view lab/test results, encounter notes, upcoming appointments, etc.  Non-urgent messages can be sent to your provider as well.   To learn more about what you can do with MyChart, go to [email or web address].    Your next appointment:   6 month(s)  The format for your next appointment:   In Person  Provider:   Oswaldo Milian, MD        Signed, Donato Heinz, MD  09/09/2021 4:12 PM    [facility]

## 2021-09-09 ENCOUNTER — Other Ambulatory Visit: Payer: Self-pay | Admitting: Primary Care

## 2021-09-09 ENCOUNTER — Encounter: Payer: Self-pay | Admitting: Cardiology

## 2021-09-09 ENCOUNTER — Ambulatory Visit (INDEPENDENT_AMBULATORY_CARE_PROVIDER_SITE_OTHER): Payer: Medicare Other | Admitting: Cardiology

## 2021-09-09 ENCOUNTER — Other Ambulatory Visit: Payer: Self-pay

## 2021-09-09 VITALS — BP 116/58 | HR 92 | Ht 61.0 in | Wt 131.0 lb

## 2021-09-09 DIAGNOSIS — E785 Hyperlipidemia, unspecified: Secondary | ICD-10-CM | POA: Diagnosis not present

## 2021-09-09 DIAGNOSIS — I5032 Chronic diastolic (congestive) heart failure: Secondary | ICD-10-CM

## 2021-09-09 DIAGNOSIS — I272 Pulmonary hypertension, unspecified: Secondary | ICD-10-CM | POA: Diagnosis not present

## 2021-09-09 NOTE — Patient Instructions (Signed)
Medication Instructions:  The current medical regimen is effective;  continue present plan and medications.  *If you need a refill on your cardiac medications before your next appointment, please call your pharmacy*   Lab Work: BMET, MAG today   If you have labs (blood work) drawn today and your tests are completely normal, you will receive your results only by: [place] (if you have MyChart) OR A paper copy in the mail If you have any lab test that is abnormal or we need to change your treatment, we will call you to review the results.   Testing/Procedures: Echocardiogram - Your physician has requested that you have an echocardiogram. Echocardiography is a painless test that uses sound waves to create images of your heart. It provides your doctor with information about the size and shape of your heart and how well your hearts chambers and valves are working. This procedure takes approximately one hour. There are no restrictions for this procedure. This will be performed at either our [facility] location - [address] [place] location [facility] 2nd floor.    Follow-Up: At [facility], you and your health needs are our priority.  As part of our continuing mission to provide you with exceptional heart care, we have created designated Provider Care Teams.  These Care Teams include your primary Cardiologist (physician) and Advanced Practice Providers (APPs -  Physician Assistants and Nurse Practitioners) who all work together to provide you with the care you need, when you need it.  We recommend signing up for the patient portal called "MyChart".  Sign up information is provided on this After Visit Summary.  MyChart is used to connect with patients for Virtual Visits (Telemedicine).  Patients are able to view lab/test results, encounter notes, upcoming appointments, etc.  Non-urgent messages can be sent to your provider as well.   To learn more  about what you can do with MyChart, go to [email or web address].    Your next appointment:   6 month(s)  The format for your next appointment:   In Person  Provider:   Oswaldo Milian, MD

## 2021-09-29 ENCOUNTER — Ambulatory Visit (HOSPITAL_COMMUNITY): Payer: Medicare Other | Attending: Cardiology

## 2021-10-05 ENCOUNTER — Encounter: Payer: Self-pay | Admitting: Nurse Practitioner

## 2021-10-06 ENCOUNTER — Other Ambulatory Visit: Payer: Self-pay

## 2021-10-06 ENCOUNTER — Encounter: Payer: Self-pay | Admitting: Nurse Practitioner

## 2021-10-06 ENCOUNTER — Ambulatory Visit (INDEPENDENT_AMBULATORY_CARE_PROVIDER_SITE_OTHER): Payer: Medicare Other | Admitting: Nurse Practitioner

## 2021-10-06 VITALS — BP 130/70 | HR 104 | Temp 98.1°F | Ht 61.0 in | Wt 130.8 lb

## 2021-10-06 DIAGNOSIS — E1169 Type 2 diabetes mellitus with other specified complication: Secondary | ICD-10-CM | POA: Diagnosis not present

## 2021-10-06 DIAGNOSIS — I272 Pulmonary hypertension, unspecified: Secondary | ICD-10-CM | POA: Diagnosis not present

## 2021-10-06 DIAGNOSIS — I5032 Chronic diastolic (congestive) heart failure: Secondary | ICD-10-CM

## 2021-10-06 DIAGNOSIS — E119 Type 2 diabetes mellitus without complications: Secondary | ICD-10-CM

## 2021-10-06 NOTE — Progress Notes (Signed)
I,[name],acting as a Education administrator for [name], [name].,have documented all relevant documentation on the behalf of [name], [name],as directed by  [name], [name] while in the presence of [name], [name]   This visit occurred during the SARS-CoV-2 public health emergency.  Safety protocols were in place, including screening questions prior to the visit, additional usage of staff PPE, and extensive cleaning of exam room while observing appropriate contact time as indicated for disinfecting solutions.  Subjective:     Patient ID: Brianna Adams , female    DOB: [birth year] , 81 y.o.   MRN: [id number]   Chief Complaint  Patient presents with   Diabetes    HPI  Pt presents today for DM f/u.  Pt has cyst on left wrist she reports has came back a few months ago, denies pain.  January of last year daughter reports she had same cyst removed. They have not been in touch with the hand surgeon.  She will be having her ECHO done and will have to see the Cardiologist.    Diabetes She presents for her follow-up diabetic visit. She has type 2 diabetes mellitus. There are no hypoglycemic associated symptoms. Pertinent negatives for hypoglycemia include no dizziness or headaches. There are no diabetic associated symptoms. Pertinent negatives for diabetes include no chest pain, no fatigue, no polydipsia, no polyphagia and no polyuria. There are no diabetic complications. Risk factors for coronary artery disease include sedentary lifestyle. Current diabetic treatment includes oral agent (monotherapy). She has not had a previous visit with a dietitian. (Does not check her blood sugars regularly ) Eye exam is not current.     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   PE (pulmonary thromboembolism) ([place]) 02/29/2020   Pulmonary hypertension ([place])    Uterine cancer ([place])      Family History  Problem Relation Age of Onset   Hypertension Mother    Heart attack Father    Cancer Maternal Aunt        unknown type   Stomach cancer Neg Hx    Colon cancer Neg Hx    Esophageal cancer Neg Hx    Pancreatic cancer Neg Hx      Current Outpatient Medications:    acetaminophen (TYLENOL) 500 MG tablet, Take 1,000 mg by mouth every 6 (six) hours as needed for mild pain or fever., Disp: , Rfl:    albuterol (VENTOLIN HFA) 108 (90 Base) MCG/ACT inhaler, Inhale 2 puffs into the lungs every 6 (six) hours as needed for wheezing or shortness of breath., Disp: 8 g, Rfl: 2   CALCIUM PO, Take 1,200 mg by mouth daily., Disp: , Rfl:    Cinnamon 500 MG TABS, Take 1,000 mg by mouth daily. , Disp: , Rfl:    docusate sodium (COLACE) 100 MG capsule, Take 1 capsule (100 mg total) by mouth daily. (Patient not taking: Reported on 10/16/2021), Disp: 60 capsule, Rfl: 1   Fluticasone-Umeclidin-Vilant (TRELEGY ELLIPTA) 100-62.5-25 MCG/ACT AEPB, INHALE 1 PUFF INTO THE LUNGS DAILY, Disp: 60 each, Rfl: 2   furosemide (LASIX) 20 MG tablet, TAKE 1  TABLET BY MOUTH EVERY DAY, Disp: 90 tablet, Rfl: 2   JARDIANCE 10 MG TABS tablet, TAKE 1 TABLET BY MOUTH DAILY BEFORE BREAKFAST., Disp: 30 tablet, Rfl: 3   metFORMIN (GLUCOPHAGE) 1000 MG tablet, Take 1 tablet (1,000 mg total) by mouth 2 (two) times daily with a meal., Disp: 180 tablet, Rfl: 1   Multiple Vitamins-Minerals (CENTRUM SILVER 50+WOMEN PO), Take 1 tablet by mouth daily., Disp: , Rfl:    Multiple Vitamins-Minerals (OCUVITE EYE HEALTH FORMULA PO), Take 1 capsule by mouth daily., Disp: , Rfl:    omeprazole (PRILOSEC) 40 MG capsule, TAKE 1 CAPSULE BY MOUTH EVERY DAY, Disp: 90 capsule, Rfl: 1   pravastatin (PRAVACHOL) 10 MG tablet, TAKE 1 TABLET BY  MOUTH EVERY DAY, Disp: 90 tablet, Rfl: 1   vitamin C (ASCORBIC ACID) 500 MG tablet, Take 500 mg by mouth every other day. Tues Thurs Sat and Sun, Disp: , Rfl:    vitamin E 180 MG (400 UNITS) capsule, Take 400 Units by mouth every Monday, Wednesday, and Friday., Disp: , Rfl:    apixaban (ELIQUIS) 2.5 MG TABS tablet, Take 1 tablet (2.5 mg total) by mouth 2 (two) times daily., Disp: 60 tablet, Rfl: 5   benzonatate (TESSALON PERLES) 100 MG capsule, Take 1 capsule (100 mg total) by mouth 3 (three) times daily as needed for cough., Disp: 30 capsule, Rfl: 1   ferrous sulfate 325 (65 FE) MG EC tablet, Take 1 tablet (325 mg total) by mouth 2 (two) times daily with a meal., Disp: 60 tablet, Rfl: 2   losartan (COZAAR) 25 MG tablet, Take 12.5 mg by mouth daily., Disp: , Rfl:    pseudoephedrine-guaifenesin (MUCINEX D) 60-600 MG 12 hr tablet, Take 1 tablet by mouth at bedtime., Disp: , Rfl:    Allergies  Allergen Reactions   Ace Inhibitors Swelling   Lisinopril Swelling   Penicillin G Hives, Shortness Of Breath and Rash   Atorvastatin Other (See Comments)    Myalgias (Muscle Pain) in high dosages     Cortisone Other (See Comments)    Unknown reaction   Hydrocortisone Other (See Comments)    Unknown reaction   Meloxicam Other (See Comments)    Bloody stools   Pravastatin Other (See Comments)    Chest Pain if high dosage      Review of Systems  Constitutional: Negative.  Negative for fatigue.  Respiratory: Negative.    Cardiovascular: Negative.  Negative for chest pain.  Endocrine: Negative for polydipsia, polyphagia and polyuria.  Neurological: Negative.  Negative for dizziness and headaches.  Psychiatric/Behavioral: Negative.      Today's Vitals   10/06/21 1127  BP: 130/70  Pulse: (!) 104  Temp: 98.1 F (36.7 C)  SpO2: 98%  Weight: 130 lb 12.8 oz (59.3 kg)  Height: _0  (1.549 m)   Body mass index is 24.71 kg/m.   Objective:  Physical Exam Vitals reviewed.  Constitutional:       General: She is not in acute distress.    Appearance: Normal appearance.  Cardiovascular:     Pulses: Normal pulses.     Heart sounds: Normal heart sounds. No murmur heard. Pulmonary:     Effort: Pulmonary effort is normal. No respiratory distress.     Breath sounds: Normal breath sounds. No wheezing.  Musculoskeletal:     Comments: Left wrist cyst present mostly firm with soft center.   Neurological:     General: No focal deficit present.     Mental Status: She is  alert and oriented to person, place, and time.     Cranial Nerves: No cranial nerve deficit.     Motor: No weakness.  Psychiatric:        Mood and Affect: Mood normal.        Behavior: Behavior normal.        Thought Content: Thought content normal.        Judgment: Judgment normal.        Assessment And Plan:     1. Type 2 diabetes mellitus with other specified complication, without long-term current use of insulin (HCC) Comments: Stable, continue current medications - Microalbumin / creatinine urine ratio - Hemoglobin A1c  2. Pulmonary hypertension, unspecified ([place]) Comments: Stable, continue follow up with Cardiology/Pulmonology.   3. Chronic diastolic CHF (congestive heart failure) ([place]) Comments: Stable, continue follow up with Cardiology - BMP8+eGFR - Magnesium     Patient was given opportunity to ask questions. Patient verbalized understanding of the plan and was able to repeat key elements of the plan. All questions were answered to their satisfaction.  [name], [name]   I, [name], [name], have reviewed all documentation for this visit. The documentation on 10/06/21 for the exam, diagnosis, procedures, and orders are all accurate and complete.   IF YOU HAVE BEEN REFERRED TO A SPECIALIST, IT MAY TAKE 1-2 WEEKS TO SCHEDULE/PROCESS THE REFERRAL. IF YOU HAVE NOT HEARD FROM US/SPECIALIST IN TWO WEEKS, PLEASE GIVE [place] A CALL AT [phone] X [phone].   THE PATIENT IS ENCOURAGED TO PRACTICE SOCIAL  DISTANCING DUE TO THE COVID-19 PANDEMIC.

## 2021-10-06 NOTE — Patient Instructions (Signed)

## 2021-10-07 LAB — BMP8+EGFR
BUN/Creatinine Ratio: 24 (ref 12–28)
BUN: 20 mg/dL (ref 8–27)
CO2: 23 mmol/L (ref 20–29)
Calcium: 10.4 mg/dL — ABNORMAL HIGH (ref 8.7–10.3)
Chloride: 99 mmol/L (ref 96–106)
Creatinine, Ser: 0.84 mg/dL (ref 0.57–1.00)
Glucose: 132 mg/dL — ABNORMAL HIGH (ref 70–99)
Potassium: 4.7 mmol/L (ref 3.5–5.2)
Sodium: 140 mmol/L (ref 134–144)
eGFR: 70 mL/min/{1.73_m2} (ref >59)

## 2021-10-07 LAB — MICROALBUMIN / CREATININE URINE RATIO
Creatinine, Urine: 27.7 mg/dL
Microalb/Creat Ratio: 39 mg/g creat — ABNORMAL HIGH (ref 0–29)
Microalbumin, Urine: 10.9 ug/mL

## 2021-10-07 LAB — HEMOGLOBIN A1C
Est. average glucose Bld gHb Est-mCnc: 146 mg/dL
Hgb A1c MFr Bld: 6.7 % — ABNORMAL HIGH (ref 4.8–5.6)

## 2021-10-07 LAB — MAGNESIUM: Magnesium: 2.1 mg/dL (ref 1.6–2.3)

## 2021-10-08 ENCOUNTER — Ambulatory Visit (HOSPITAL_COMMUNITY): Payer: Medicare Other | Attending: Cardiology

## 2021-10-08 ENCOUNTER — Other Ambulatory Visit: Payer: Self-pay

## 2021-10-08 DIAGNOSIS — I272 Pulmonary hypertension, unspecified: Secondary | ICD-10-CM | POA: Insufficient documentation

## 2021-10-08 LAB — ECHOCARDIOGRAM COMPLETE
Area-P 1/2: 2.83 cm2
S' Lateral: 2.5 cm

## 2021-10-12 ENCOUNTER — Other Ambulatory Visit: Payer: Self-pay | Admitting: *Deleted

## 2021-10-12 DIAGNOSIS — D500 Iron deficiency anemia secondary to blood loss (chronic): Secondary | ICD-10-CM

## 2021-10-15 NOTE — Progress Notes (Signed)
Cardiology Office Note:    Date:  10/21/2021   ID:  Brianna Adams, DOB 02/23/1941, MRN [id number]  PCP:  [name], [name]  Cardiologist:  None  Electrophysiologist:  None   Referring MD: [name], [name]   Chief Complaint  Patient presents with   Pulmonary hypertension    History of Present Illness:    Brianna Adams is a 81 y.o. female with a hx of chronic diastolic heart failure, COPD, PE on Eliquis, T2DM who presents for follow-up.  She was referred by [name], [name] for evaluation of heart failure, initially seen on 01/08/2021.  She was admitted from 82/5 through 12/10/2020.  She presented with worsening shortness of breath.  Had to increase her home oxygen from 3 to 4 L.  Reports she had not been taking her Lasix regularly.  Chest x-ray showed pulmonary edema, BNP 24.  Echocardiogram 12/10/2020 showed LVEF 55 to 25%, grade 1 diastolic dysfunction, moderate RV enlargement, severe pulmonary hypertension (RVSP 85 mmHg).  She received IV diuresis with improvement in symptoms and was discharged on Lasix 20 mg daily.  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  Follows with pulmonology.  Echocardiogram 10/08/2021 showed EF 60 to 65%, interventricular septal flattening in systole and diastole consistent with RV pressure and volume overload, moderate RV systolic dysfunction, moderate RV enlargement, severely elevated pulmonary pressures (RVSP 90), mild to moderate TR, RAP 3.  Since last clinic visit, she reports she has been doing OK.  Does report has been having dyspnea.  Denies any chest pain, lightheadedness, syncope, lower extremity edema.  Reports has been having palpitations about 1-2 times per month, will last for 15 to 20 minutes, feels like heart is racing.   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   PE (pulmonary thromboembolism) ([place]) 02/29/2020   Pulmonary hypertension ([place])    Uterine cancer [facility])     Past Surgical History:  Procedure Laterality Date   ABDOMINAL HYSTERECTOMY     cataract surgery  2012 and 2017   COLONOSCOPY     FIBEROPTIC BRONCHOSCOPY     FLEXIBLE SIGMOIDOSCOPY N/A 08/13/2021   Procedure: FLEXIBLE SIGMOIDOSCOPY;  Surgeon: Sharyn Creamer, MD;  Location: [facility];  Service: Gastroenterology;  Laterality: N/A;   GANGLION CYST EXCISION Left 09/11/2020   Procedure: EXCISION VOLAR RADIAL GANGLION OF LEFT WRIST;  Surgeon: Daryll Brod, MD;  Location: [place];  Service: Orthopedics;  Laterality: Left;  AXILLARY BLOCK   resection of lung cancer  2018   RIGHT HEART CATH N/A 10/20/2021   Procedure: RIGHT HEART CATH;  Surgeon: Leonie Man, MD;  Location: [place] [facility];  Service: Cardiovascular;  Laterality: N/A;   right lower lobe non-anatomocal lung resection wedge     right thorascoscopy      Current Medications: Current Meds  Medication Sig   acetaminophen (TYLENOL) 500 MG tablet Take 1,000 mg by mouth every  6 (six) hours as needed for mild pain or fever.   albuterol (VENTOLIN HFA) 108 (90 Base) MCG/ACT inhaler Inhale 2 puffs into the lungs every 6 (six) hours as needed for wheezing or shortness of breath.   apixaban (ELIQUIS) 2.5 MG TABS tablet Take 1 tablet (2.5 mg total) by mouth 2 (two) times daily.   CALCIUM PO Take 1,200 mg by mouth daily.   Cinnamon 500 MG TABS Take 1,000 mg by mouth daily.    docusate sodium (COLACE) 100 MG capsule Take 1 capsule (100 mg total) by mouth daily. (Patient not taking: Reported on 10/16/2021)   doxycycline (VIBRA-TABS) 100 MG tablet Take 1  tablet (100 mg total) by mouth 2 (two) times daily. (Patient not taking: Reported on 10/16/2021)   ferrous sulfate 325 (65 FE) MG EC tablet Take 1 tablet (325 mg total) by mouth 3 (three) times daily with meals. (Patient not taking: Reported on 10/16/2021)   Fluticasone-Umeclidin-Vilant (TRELEGY ELLIPTA) 100-62.5-25 MCG/ACT AEPB INHALE 1 PUFF INTO THE LUNGS DAILY   furosemide (LASIX) 20 MG tablet TAKE 1 TABLET BY MOUTH EVERY DAY   JARDIANCE 10 MG TABS tablet TAKE 1 TABLET BY MOUTH DAILY BEFORE BREAKFAST.   metFORMIN (GLUCOPHAGE) 1000 MG tablet Take 1 tablet (1,000 mg total) by mouth 2 (two) times daily with a meal.   Multiple Vitamins-Minerals (CENTRUM SILVER 50+WOMEN PO) Take 1 tablet by mouth daily.   Multiple Vitamins-Minerals (OCUVITE EYE HEALTH FORMULA PO) Take 1 capsule by mouth daily.   omeprazole (PRILOSEC) 40 MG capsule TAKE 1 CAPSULE BY MOUTH EVERY DAY   pravastatin (PRAVACHOL) 10 MG tablet TAKE 1 TABLET BY MOUTH EVERY DAY   vitamin C (ASCORBIC ACID) 500 MG tablet Take 500 mg by mouth every other day. Tues Thurs Sat and Sun   vitamin E 180 MG (400 UNITS) capsule Take 400 Units by mouth every Monday, Wednesday, and Friday.   [DISCONTINUED] Pseudoephedrine-guaiFENesin ([place] D PO) Take by mouth. nightly     Allergies:   Ace inhibitors, Lisinopril, Penicillin g, Atorvastatin, Cortisone, Hydrocortisone, Meloxicam, and Pravastatin   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r-sr Pharmacist, hospital  Tobacco Use   Smoking status: Former    Packs/day: 1.00    Years: 58.00    Pack years: 58.00    Types: Cigarettes    Quit date: 10/22/2015    Years since quitting: 6.0   Smokeless tobacco: Never   Tobacco comments:    Occupational psychologist Use: Never used  Substance and Sexual Activity   Alcohol use: Never   Drug use: Never   Sexual activity: Not Currently    Birth control/protection: Surgical    Comment: HYSTERECTOMY  Other Topics Concern   Not on file  Social History Narrative   Not on file   Social Determinants of Health   Financial Resource Strain: Low Risk    Difficulty of Paying Living Expenses: Not hard at all  Food Insecurity: No Food Insecurity   Worried About Charity fundraiser in the Last Year: Never true   [place] in the Last Year: Never true  Transportation Needs: No Transportation Needs   Lack of Transportation (Medical):  No   Lack of Transportation (Non-Medical): No  Physical Activity: Not on file  Stress: Not on file  Social Connections: Not on file     Family History: The patient's family history includes Cancer in her maternal aunt; Heart attack in her father; Hypertension in her mother. There is no history of Stomach cancer, Colon cancer, Esophageal cancer, or Pancreatic cancer.  ROS:   Please see the history of present illness.    All other systems reviewed and are negative.  EKGs/Labs/Other Studies Reviewed:    The following studies were reviewed today:   EKG:   01/08/21: Sinus rhythm. Rate 75 bpm. No ST abnormalities, Left atrial enlargement  Recent Labs: 03/09/2021: B Natriuretic Peptide 341.7 05/24/2021: ALT 12 10/06/2021: Magnesium 2.1 10/20/2021: BUN 18; Creatinine, Ser 0.82; Hemoglobin 12.6; Hemoglobin 12.2; Platelets 250; Potassium 3.9; Potassium 3.8; Sodium 140; Sodium 140  Recent Lipid Panel    Component Value Date/Time   CHOL 151 03/10/2021 0620   CHOL 189 03/03/2021 1150   TRIG 80 03/10/2021 0620   HDL 51 03/10/2021 0620   HDL 74 03/03/2021 1150   CHOLHDL 3.0 03/10/2021 0620   VLDL 16 03/10/2021 0620   LDLCALC 84 03/10/2021 0620   LDLCALC 102 (H) 03/03/2021 1150    Physical Exam:    VS:  BP 108/68    Pulse 77    Ht _0  (1.549 m)    Wt 134 lb  6.4 oz (61 kg)    SpO2 96%    BMI 25.39 kg/m     Wt Readings from Last 3 Encounters:  10/20/21 134 lb (60.8 kg)  10/16/21 134 lb 6.4 oz (61 kg)  10/06/21 130 lb 12.8 oz (59.3 kg)     GEN: Well nourished, well developed in no acute distress HEENT: Normal NECK: No JVD; No carotid bruits LYMPHATICS: No lymphadenopathy CARDIAC: RRR, no murmurs, rubs, gallops RESPIRATORY:  Clear to auscultation without rales, wheezing or rhonchi  ABDOMEN: Soft, non-tender, non-distended MUSCULOSKELETAL:  No edema; No deformity  SKIN: Warm and dry NEUROLOGIC:  Alert and oriented x 3 PSYCHIATRIC:  Normal affect   ASSESSMENT:    1. Pulmonary hypertension, unspecified ([place])   2. Chronic diastolic CHF (congestive heart failure) (HCC)   3. Palpitations   4. Hyperlipidemia, unspecified hyperlipidemia type      PLAN:    Chronic diastolic heart failure: EF 12/2020 with normal EF, moderately enlarged RV, normal RV function, severe pulmonary hypertension -Continue Lasix 20 mg daily.   -Continue Jardiance 10 mg daily  Pulmonary hypertension: RVSP 85 mmHg on echo 12/2020.  Likely combination of group 2 and group 3 PH.  Also could potentially be group 4 given history of PE.  Follows with pulmonology.  Echocardiogram 10/08/2021 showed EF 60 to 65%, interventricular septal flattening in systole and diastole consistent with RV pressure and volume overload, moderate RV systolic dysfunction, moderate RV enlargement, severely elevated pulmonary pressures (RVSP 90), mild to moderate TR, RAP 3. -Recommend RHC for further evaluation. Risks/benefits of RHC discussed with patient and she is willing to proceed.  Palpitations: Description concerning for arrhythmia, evaluate with Zio patch x 2 weeks  Hyperlipidemia: On pravastatin 10 mg daily.  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  On 2.5 mg dose Eliquis due to anemia and issues with rectal bleeding  RTC in 3 months   Medication Adjustments/Labs and Tests  Ordered: Current medicines are reviewed at length with the patient today.  Concerns regarding medicines are outlined above.  No orders  of the defined types were placed in this encounter.  No orders of the defined types were placed in this encounter.   Patient Instructions  Medication Instructions:  Continue same medications *If you need a refill on your cardiac medications before your next appointment, please call your pharmacy*   Lab Work: None ordered   Testing/Procedures: Right Heart Cath    Follow instructions below    Follow-Up: At [facility], you and your health needs are our priority.  As part of our continuing mission to provide you with exceptional heart care, we have created designated Provider Care Teams.  These Care Teams include your primary Cardiologist (physician) and Advanced Practice Providers (APPs -  Physician Assistants and Nurse Practitioners) who all work together to provide you with the care you need, when you need it.  We recommend signing up for the patient portal called "MyChart".  Sign up information is provided on this After Visit Summary.  MyChart is used to connect with patients for Virtual Visits (Telemedicine).  Patients are able to view lab/test results, encounter notes, upcoming appointments, etc.  Non-urgent messages can be sent to your provider as well.   To learn more about what you can do with MyChart, go to [email or web address].    Your next appointment:  3 months      The format for your next appointment:  Office   Provider:  [place] [address] [place] [address] Dept: [phone] Loc: [phone]  Alailah Safley  10/16/2021  You are scheduled for a Right Cardiac Cath on Tuesday 10/20/21, with Dr. Ellyn Hack.  1. Please arrive at the [facility] (Main Entrance A) at [facility]: [address]  [place], [place] [address] at 10:30 am (This time is two hours before your procedure to ensure your preparation). Free valet parking service is available.   Special note: Every effort is made to have your procedure done on time. Please understand that emergencies sometimes delay scheduled procedures.  2. Diet: Do not eat solid foods after midnight.  The patient may have clear liquids until 5am upon the day of the procedure.  3. Labs: None needed  4. Medication instructions in preparation for your procedure:      Hold Eliquis 2 days before Cath    On the morning of your procedure, take Aspirin 81 mg and any morning medicines NOT listed above.  You may use sips of water.  5. Plan for one night stay--bring personal belongings. 6. Bring a current list of your medications and current insurance cards. 7. You MUST have a responsible person to drive you home. 8. Someone MUST be with you the first 24 hours after you arrive home or your discharge will be delayed. 9. Please wear clothes that are easy to get on and off and wear slip-on shoes.  Thank you for allowing [place] to care for you!   -- [facility] Invasive Cardiovascular services      Signed, Donato Heinz, MD  10/21/2021 7:54 AM    [place]

## 2021-10-16 ENCOUNTER — Other Ambulatory Visit: Payer: Self-pay

## 2021-10-16 ENCOUNTER — Encounter: Payer: Self-pay | Admitting: Cardiology

## 2021-10-16 ENCOUNTER — Ambulatory Visit (INDEPENDENT_AMBULATORY_CARE_PROVIDER_SITE_OTHER): Payer: Medicare Other | Admitting: Cardiology

## 2021-10-16 ENCOUNTER — Telehealth: Payer: Self-pay | Admitting: Cardiology

## 2021-10-16 VITALS — BP 108/68 | HR 77 | Ht 61.0 in | Wt 134.4 lb

## 2021-10-16 DIAGNOSIS — I5032 Chronic diastolic (congestive) heart failure: Secondary | ICD-10-CM

## 2021-10-16 DIAGNOSIS — I272 Pulmonary hypertension, unspecified: Secondary | ICD-10-CM

## 2021-10-16 DIAGNOSIS — R002 Palpitations: Secondary | ICD-10-CM | POA: Diagnosis not present

## 2021-10-16 DIAGNOSIS — E785 Hyperlipidemia, unspecified: Secondary | ICD-10-CM

## 2021-10-16 NOTE — Patient Instructions (Signed)
Medication Instructions:  Continue same medications *If you need a refill on your cardiac medications before your next appointment, please call your pharmacy*   Lab Work: None ordered   Testing/Procedures: Right Heart Cath    Follow instructions below    Follow-Up: At [facility], you and your health needs are our priority.  As part of our continuing mission to provide you with exceptional heart care, we have created designated Provider Care Teams.  These Care Teams include your primary Cardiologist (physician) and Advanced Practice Providers (APPs -  Physician Assistants and Nurse Practitioners) who all work together to provide you with the care you need, when you need it.  We recommend signing up for the patient portal called "MyChart".  Sign up information is provided on this After Visit Summary.  MyChart is used to connect with patients for Virtual Visits (Telemedicine).  Patients are able to view lab/test results, encounter notes, upcoming appointments, etc.  Non-urgent messages can be sent to your provider as well.   To learn more about what you can do with MyChart, go to [email or web address].    Your next appointment:  3 months      The format for your next appointment:  Office   Provider:  [place] [address] Dept: [phone] Loc: [phone]  Brianna Adams  10/16/2021  You are scheduled for a Right Cardiac Cath on Tuesday 10/20/21, with Dr. Ellyn Hack.  1. Please arrive at the [facility] (Main Entrance A) at [facility]: [address] [place], [place] [address] at 10:30 am (This time is two hours before your procedure to ensure your preparation). Free valet parking service is available.   Special note: Every effort is made to have your procedure done on time. Please understand that emergencies sometimes delay scheduled  procedures.  2. Diet: Do not eat solid foods after midnight.  The patient may have clear liquids until 5am upon the day of the procedure.  3. Labs: None needed  4. Medication instructions in preparation for your procedure:      Hold Eliquis 2 days before Cath    On the morning of your procedure, take Aspirin 81 mg and any morning medicines NOT listed above.  You may use sips of water.  5. Plan for one night stay--bring personal belongings. 6. Bring a current list of your medications and current insurance cards. 7. You MUST have a responsible person to drive you home. 8. Someone MUST be with you the first 24 hours after you arrive home or your discharge will be delayed. 9. Please wear clothes that are easy to get on and off and wear slip-on shoes.  Thank you for allowing [place] to care for you!   -- [place] Invasive Cardiovascular services

## 2021-10-16 NOTE — Telephone Encounter (Signed)
Mailbox full

## 2021-10-16 NOTE — Telephone Encounter (Signed)
Pt reaching out in regards to upcoming appt w/ Dr. Ellyn Hack.. please advise.

## 2021-10-19 ENCOUNTER — Telehealth: Payer: Self-pay | Admitting: *Deleted

## 2021-10-19 NOTE — Telephone Encounter (Signed)
Right Heart Cath scheduled at [facility] for: Monday February 14,2023 12:30 PM [facility] Main Entrance A [facility]) at: 10 AM -needs CBC   Diet-no solid food after midnight prior to cath, clear liquids until 5 AM day of procedure.  Medication instructions for procedure: -Hold:  Metformin/Jardiance/Lasix-AM of procedure  Eliquis-none 10/18/21 until post procedure-per 10/16/21 instructions -Except hold medications usual morning medications can be taken pre-cath with sips of water.    Must have responsible adult to drive home post procedure and be with patient first 24 hours after arriving home.  [facility] does allow one visitor to wait in the waiting room during the time you are there.   Patient reports does not currently have any new symptoms concerning for COVID-19 and no household members with COVID-19 like illness.        Reviewed procedure instructions with patient's daughter (DPR), Benita.

## 2021-10-20 ENCOUNTER — Encounter (HOSPITAL_COMMUNITY)
Admission: RE | Disposition: A | Discharge status: Home / Self Care | Payer: Self-pay | Source: Home / Self Care | Attending: Cardiology

## 2021-10-20 ENCOUNTER — Ambulatory Visit (HOSPITAL_COMMUNITY)
Admission: RE | Admit: 2021-10-20 | Discharge: 2021-10-20 | Disposition: A | Discharge status: Home / Self Care | Payer: Medicare Other | Attending: Cardiology | Admitting: Cardiology

## 2021-10-20 ENCOUNTER — Other Ambulatory Visit: Payer: Self-pay

## 2021-10-20 DIAGNOSIS — I272 Pulmonary hypertension, unspecified: Secondary | ICD-10-CM | POA: Diagnosis present

## 2021-10-20 DIAGNOSIS — E119 Type 2 diabetes mellitus without complications: Secondary | ICD-10-CM | POA: Diagnosis not present

## 2021-10-20 DIAGNOSIS — E785 Hyperlipidemia, unspecified: Secondary | ICD-10-CM | POA: Diagnosis not present

## 2021-10-20 DIAGNOSIS — Z79899 Other long term (current) drug therapy: Secondary | ICD-10-CM | POA: Diagnosis not present

## 2021-10-20 DIAGNOSIS — I5032 Chronic diastolic (congestive) heart failure: Secondary | ICD-10-CM | POA: Diagnosis not present

## 2021-10-20 DIAGNOSIS — Z7901 Long term (current) use of anticoagulants: Secondary | ICD-10-CM | POA: Insufficient documentation

## 2021-10-20 DIAGNOSIS — J449 Chronic obstructive pulmonary disease, unspecified: Secondary | ICD-10-CM | POA: Diagnosis present

## 2021-10-20 DIAGNOSIS — Z7984 Long term (current) use of oral hypoglycemic drugs: Secondary | ICD-10-CM | POA: Diagnosis not present

## 2021-10-20 DIAGNOSIS — Z86711 Personal history of pulmonary embolism: Secondary | ICD-10-CM | POA: Diagnosis not present

## 2021-10-20 DIAGNOSIS — Z87891 Personal history of nicotine dependence: Secondary | ICD-10-CM | POA: Insufficient documentation

## 2021-10-20 DIAGNOSIS — R002 Palpitations: Secondary | ICD-10-CM | POA: Diagnosis not present

## 2021-10-20 DIAGNOSIS — J439 Emphysema, unspecified: Secondary | ICD-10-CM | POA: Diagnosis present

## 2021-10-20 HISTORY — PX: RIGHT HEART CATH: CATH118263

## 2021-10-20 LAB — POCT I-STAT EG7
Acid-Base Excess: 1 mmol/L (ref 0.0–2.0)
Acid-Base Excess: 2 mmol/L (ref 0.0–2.0)
Bicarbonate: 26.9 mmol/L (ref 20.0–28.0)
Bicarbonate: 27.8 mmol/L (ref 20.0–28.0)
Calcium, Ion: 1.29 mmol/L (ref 1.15–1.40)
Calcium, Ion: 1.22 mmol/L (ref 1.15–1.40)
HCT: 37 % (ref 36.0–46.0)
HCT: 36 % (ref 36.0–46.0)
Hemoglobin: 12.6 g/dL (ref 12.0–15.0)
Hemoglobin: 12.2 g/dL (ref 12.0–15.0)
O2 Saturation: 66 %
O2 Saturation: 68 %
Potassium: 3.9 mmol/L (ref 3.5–5.1)
Potassium: 3.8 mmol/L (ref 3.5–5.1)
Sodium: 140 mmol/L (ref 135–145)
Sodium: 140 mmol/L (ref 135–145)
TCO2: 28 mmol/L (ref 22–32)
TCO2: 29 mmol/L (ref 22–32)
pCO2, Ven: 44.3 mmHg (ref 44–60)
pCO2, Ven: 45.4 mmHg (ref 44–60)
pH, Ven: 7.39 (ref 7.25–7.43)
pH, Ven: 7.395 (ref 7.25–7.43)
pO2, Ven: 35 mmHg (ref 32–45)
pO2, Ven: 36 mmHg (ref 32–45)

## 2021-10-20 LAB — GLUCOSE, CAPILLARY
Glucose-Capillary: 117 mg/dL — ABNORMAL HIGH (ref 70–99)
Glucose-Capillary: 116 mg/dL — ABNORMAL HIGH (ref 70–99)

## 2021-10-20 LAB — CBC
HCT: 43.8 % (ref 36.0–46.0)
Hemoglobin: 14.2 g/dL (ref 12.0–15.0)
MCH: 28.1 pg (ref 26.0–34.0)
MCHC: 32.4 g/dL (ref 30.0–36.0)
MCV: 86.7 fL (ref 80.0–100.0)
Platelets: 250 10*3/uL (ref 150–400)
RBC: 5.05 MIL/uL (ref 3.87–5.11)
RDW: 19.8 % — ABNORMAL HIGH (ref 11.5–15.5)
WBC: 6.9 10*3/uL (ref 4.0–10.5)
nRBC: 2.5 % — ABNORMAL HIGH (ref 0.0–0.2)

## 2021-10-20 LAB — BASIC METABOLIC PANEL
Anion gap: 8 (ref 5–15)
BUN: 18 mg/dL (ref 8–23)
CO2: 26 mmol/L (ref 22–32)
Calcium: 9.5 mg/dL (ref 8.9–10.3)
Chloride: 103 mmol/L (ref 98–111)
Creatinine, Ser: 0.82 mg/dL (ref 0.44–1.00)
GFR, Estimated: >60 mL/min (ref >60)
Glucose, Bld: 130 mg/dL — ABNORMAL HIGH (ref 70–99)
Potassium: 4 mmol/L (ref 3.5–5.1)
Sodium: 137 mmol/L (ref 135–145)

## 2021-10-20 SURGERY — RIGHT HEART CATH

## 2021-10-20 MED ORDER — LIDOCAINE HCL (PF) 1 % IJ SOLN
INTRAMUSCULAR | Status: DC | PRN
  Administered 2021-10-20: 2 mL

## 2021-10-20 MED ORDER — HEPARIN (PORCINE) IN NACL 1000-0.9 UT/500ML-% IV SOLN
INTRAVENOUS | Status: AC
  Filled 2021-10-20: qty 500

## 2021-10-20 MED ORDER — SODIUM CHLORIDE 0.9 % IV SOLN: INTRAVENOUS | Status: DC

## 2021-10-20 MED ORDER — ACETAMINOPHEN 325 MG PO TABS: 650.0000 mg | ORAL_TABLET | ORAL | Status: DC | PRN

## 2021-10-20 MED ORDER — LIDOCAINE HCL (PF) 1 % IJ SOLN
INTRAMUSCULAR | Status: AC
  Filled 2021-10-20: qty 30

## 2021-10-20 MED ORDER — HEPARIN (PORCINE) IN NACL 1000-0.9 UT/500ML-% IV SOLN
INTRAVENOUS | Status: DC | PRN
  Administered 2021-10-20: 500 mL

## 2021-10-20 MED ORDER — SODIUM CHLORIDE 0.9% FLUSH: 3.0000 mL | INTRAVENOUS | Status: DC | PRN

## 2021-10-20 MED ORDER — ONDANSETRON HCL 4 MG/2ML IJ SOLN: 4.0000 mg | Freq: Four times a day (QID) | INTRAMUSCULAR | Status: DC | PRN

## 2021-10-20 MED ORDER — SODIUM CHLORIDE 0.9 % IV SOLN: 250.0000 mL | INTRAVENOUS | Status: DC | PRN

## 2021-10-20 MED ORDER — SODIUM CHLORIDE 0.9% FLUSH: 3.0000 mL | Freq: Two times a day (BID) | INTRAVENOUS | Status: DC

## 2021-10-20 SURGICAL SUPPLY — 8 items
CATH BALLN WEDGE 5F 110CM (CATHETERS) IMPLANT
CATH SWAN GANZ 7F STRAIGHT (CATHETERS) ×1 IMPLANT
GLIDESHEATH SLENDER 7FR .021G (SHEATH) ×1 IMPLANT
PACK CARDIAC CATHETERIZATION (CUSTOM PROCEDURE TRAY) ×1 IMPLANT
SHEATH GLIDE SLENDER 4/5FR (SHEATH) IMPLANT
TRANSDUCER W/STOPCOCK (MISCELLANEOUS) ×1 IMPLANT
TUBING ART PRESS 72  MALE/FEM (TUBING) ×1
TUBING ART PRESS 72 MALE/FEM (TUBING) IMPLANT

## 2021-10-20 NOTE — H&P (Signed)
Cardiology Office Note: Outpatient Procedure H&P   Date:  10/20/2021   ID:  Brianna Adams, DOB 05/28/1941, MRN [id number]  PCP:  [name], [name]  Cardiologist:  None  Electrophysiologist:  None   Referring MD: [name], [name]   Chief Complaint  Patient presents with   Pulmonary hypertension    History of Present Illness:    Brianna Adams is a 81 y.o. female with a hx of chronic diastolic heart failure, COPD, PE on Eliquis, T2DM who presents for follow-up.  She was referred by [name], [name] for evaluation of heart failure, initially seen on 01/08/2021.  She was admitted from 49/5 through 12/10/2020.  She presented with worsening shortness of breath.  Had to increase her home oxygen from 3 to 4 L.  Reports she had not been taking her Lasix regularly.  Chest x-ray showed pulmonary edema, BNP 24.  Echocardiogram 12/10/2020 showed LVEF 55 to 81%, grade 1 diastolic dysfunction, moderate RV enlargement, severe pulmonary hypertension (RVSP 85 mmHg).  She received IV diuresis with improvement in symptoms and was discharged on Lasix 20 mg daily.  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  Follows with pulmonology.  Echocardiogram 10/08/2021 showed EF 60 to 65%, interventricular septal flattening in systole and diastole consistent with RV pressure and volume overload, moderate RV systolic dysfunction, moderate RV enlargement, severely elevated pulmonary pressures (RVSP 90), mild to moderate TR, RAP 3.  Since last clinic visit, she reports she has been doing OK.  Does report has been having dyspnea.  Denies any chest pain, lightheadedness, syncope, lower extremity edema.  Reports has been having palpitations about 1-2 times per month, will last for 15 to 20 minutes, feels like heart is racing.   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   PE (pulmonary thromboembolism) ([place]) 02/29/2020   Pulmonary hypertension ([place])    Uterine cancer [facility])     Past Surgical History:  Procedure Laterality Date   ABDOMINAL HYSTERECTOMY     cataract surgery  2012 and 2017   COLONOSCOPY     FIBEROPTIC BRONCHOSCOPY     FLEXIBLE SIGMOIDOSCOPY N/A 08/13/2021   Procedure: FLEXIBLE SIGMOIDOSCOPY;  Surgeon: Sharyn Creamer, MD;  Location: [facility];  Service: Gastroenterology;  Laterality: N/A;   GANGLION CYST EXCISION Left 09/11/2020   Procedure: EXCISION VOLAR RADIAL GANGLION OF LEFT WRIST;  Surgeon: Daryll Brod, MD;  Location: [place];  Service: Orthopedics;  Laterality: Left;  AXILLARY BLOCK   resection of lung cancer  2018   right lower lobe non-anatomocal lung resection wedge     right thorascoscopy      Current Medications: Current Meds  Medication Sig   acetaminophen (TYLENOL) 500 MG tablet Take 1,000 mg by mouth every 6 (six) hours as needed for mild pain or fever.   albuterol (VENTOLIN HFA) 108 (90 Base) MCG/ACT inhaler Inhale 2 puffs into the lungs every 6 (six)  hours as needed for wheezing or shortness of breath.   apixaban (ELIQUIS) 2.5 MG TABS tablet Take 1 tablet (2.5 mg total) by mouth 2 (two) times daily.   CALCIUM PO Take 1,200 mg by mouth daily.   Cinnamon 500 MG TABS Take 1,000 mg by mouth daily.    docusate sodium (COLACE) 100 MG capsule Take 1 capsule (100 mg total) by mouth daily. (Patient not taking: Reported on 10/16/2021)   doxycycline (VIBRA-TABS) 100 MG tablet Take 1 tablet (100 mg total) by mouth 2 (two) times daily. (Patient not taking: Reported on 10/16/2021)   ferrous sulfate 325 (65 FE) MG EC tablet  Take 1 tablet (325 mg total) by mouth 3 (three) times daily with meals. (Patient not taking: Reported on 10/16/2021)   Fluticasone-Umeclidin-Vilant (TRELEGY ELLIPTA) 100-62.5-25 MCG/ACT AEPB INHALE 1 PUFF INTO THE LUNGS DAILY   furosemide (LASIX) 20 MG tablet TAKE 1 TABLET BY MOUTH EVERY DAY   JARDIANCE 10 MG TABS tablet TAKE 1 TABLET BY MOUTH DAILY BEFORE BREAKFAST.   metFORMIN (GLUCOPHAGE) 1000 MG tablet Take 1 tablet (1,000 mg total) by mouth 2 (two) times daily with a meal.   Multiple Vitamins-Minerals (CENTRUM SILVER 50+WOMEN PO) Take 1 tablet by mouth daily.   Multiple Vitamins-Minerals (OCUVITE EYE HEALTH FORMULA PO) Take 1 capsule by mouth daily.   omeprazole (PRILOSEC) 40 MG capsule TAKE 1 CAPSULE BY MOUTH EVERY DAY   pravastatin (PRAVACHOL) 10 MG tablet TAKE 1 TABLET BY MOUTH EVERY DAY   vitamin C (ASCORBIC ACID) 500 MG tablet Take 500 mg by mouth every other day. Tues Thurs Sat and Sun   vitamin E 180 MG (400 UNITS) capsule Take 400 Units by mouth every Monday, Wednesday, and Friday.   [DISCONTINUED] Pseudoephedrine-guaiFENesin ([place] D PO) Take by mouth. nightly     Allergies:   Ace inhibitors, Lisinopril, Penicillin g, Atorvastatin, Cortisone, Hydrocortisone, Meloxicam, and Pravastatin   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r-sr Pharmacist, hospital  Tobacco Use   Smoking status: Former    Packs/day: 1.00    Years: 58.00    Pack years: 58.00    Types: Cigarettes    Quit date: 10/22/2015    Years since quitting: 6.0   Smokeless tobacco: Never   Tobacco comments:    Warehouse manager Use: Never used  Substance and Sexual Activity   Alcohol use: Never   Drug use: Never   Sexual activity: Not Currently     Birth control/protection: Surgical    Comment: HYSTERECTOMY  Other Topics Concern   Not on file  Social History Narrative   Not on file   Social Determinants of Health   Financial Resource Strain: Low Risk    Difficulty of Paying Living Expenses: Not hard at all  Food Insecurity: No Food Insecurity   Worried About Charity fundraiser in the Last Year: Never true   [place] in the Last Year: Never true  Transportation Needs: No Transportation Needs   Lack of Transportation (Medical): No   Lack of Transportation (Non-Medical): No  Physical Activity: Not on file  Stress: Not on file  Social Connections: Not on file  Family History: The patient's family history includes Cancer in her maternal aunt; Heart attack in her father; Hypertension in her mother. There is no history of Stomach cancer, Colon cancer, Esophageal cancer, or Pancreatic cancer.  ROS:   Please see the history of present illness.    All other systems reviewed and are negative.  EKGs/Labs/Other Studies Reviewed:    The following studies were reviewed today:   EKG:   01/08/21: Sinus rhythm. Rate 75 bpm. No ST abnormalities, Left atrial enlargement  Recent Labs: 03/09/2021: B Natriuretic Peptide 341.7 05/24/2021: ALT 12 10/06/2021: Magnesium 2.1 10/20/2021: BUN 18; Creatinine, Ser 0.82; Hemoglobin 14.2; Platelets 250; Potassium 4.0; Sodium 137  Recent Lipid Panel    Component Value Date/Time   CHOL 151 03/10/2021 0620   CHOL 189 03/03/2021 1150   TRIG 80 03/10/2021 0620   HDL 51 03/10/2021 0620   HDL 74 03/03/2021 1150   CHOLHDL 3.0 03/10/2021 0620   VLDL 16 03/10/2021 0620   LDLCALC 84 03/10/2021 0620   LDLCALC 102 (H) 03/03/2021 1150    Physical Exam:    VS:  BP 108/68    Pulse 77    Ht _0  (1.549 m)    Wt 134 lb 6.4 oz (61 kg)    SpO2 96%    BMI 25.39 kg/m     Wt Readings from Last 3 Encounters:  10/20/21 134 lb (60.8 kg)  10/16/21 134 lb 6.4 oz (61 kg)  10/06/21 130 lb 12.8 oz (59.3  kg)     GEN: Well nourished, well developed in no acute distress HEENT: Normal NECK: No JVD; No carotid bruits LYMPHATICS: No lymphadenopathy CARDIAC: RRR, no murmurs, rubs, gallops RESPIRATORY:  Clear to auscultation without rales, wheezing or rhonchi  ABDOMEN: Soft, non-tender, non-distended MUSCULOSKELETAL:  No edema; No deformity  SKIN: Warm and dry NEUROLOGIC:  Alert and oriented x 3 PSYCHIATRIC:  Normal affect   ASSESSMENT:    1. Pulmonary hypertension, unspecified ([place])   2. Chronic diastolic CHF (congestive heart failure) (HCC)   3. Palpitations   4. Hyperlipidemia, unspecified hyperlipidemia type      PLAN:    Chronic diastolic heart failure: EF 12/2020 with normal EF, moderately enlarged RV, normal RV function, severe pulmonary hypertension -Continue Lasix 20 mg daily.   -Continue Jardiance 10 mg daily  Pulmonary hypertension: RVSP 85 mmHg on echo 12/2020.  Likely combination of group 2 and group 3 PH.  Also could potentially be group 4 given history of PE.  Follows with pulmonology.  Echocardiogram 10/08/2021 showed EF 60 to 65%, interventricular septal flattening in systole and diastole consistent with RV pressure and volume overload, moderate RV systolic dysfunction, moderate RV enlargement, severely elevated pulmonary pressures (RVSP 90), mild to moderate TR, RAP 3. -Recommend RHC for further evaluation. Risks/benefits of RHC discussed with patient and she is willing to proceed.  Palpitations: Description concerning for arrhythmia, evaluate with Zio patch x 2 weeks  Hyperlipidemia: On pravastatin 10 mg daily.  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  On 2.5 mg dose Eliquis due to anemia and issues with rectal bleeding  RTC in 3 months   Medication Adjustments/Labs and Tests Ordered: Current medicines are reviewed at length with the patient today.  Concerns regarding medicines are outlined above.  No orders of the defined types were placed in this  encounter.  No orders of the defined types were placed in this encounter.   Patient Instructions  Medication Instructions:  Continue same medications *If you need  a refill on your cardiac medications before your next appointment, please call your pharmacy*   Lab Work: None ordered   Testing/Procedures: Right Heart Cath    Follow instructions below      Signed, Oswaldo Milian L. MD [facility]   10/20/2021  Patient seen and evaluated on 10/20/2021  See updated H&P note.  Glenetta Hew, MD

## 2021-10-20 NOTE — Discharge Instructions (Signed)
Remove dressing in 24 hours. You can shower after removing dressing. Do NOT put bandages, creams, ointments, lotions over the site. Watch for signs of infection (fever, redness, drainage, etc.) If so, please call doctor. If bleeding occurs, hold pressure for 15 minutes. If bleeding continues, call doctor.

## 2021-10-20 NOTE — Progress Notes (Signed)
Patient reports taking aspirin 81 mg this morning, however it is not ordered. Rennis Harding, RN made aware.

## 2021-10-20 NOTE — Interval H&P Note (Signed)
History and Physical Interval Note:  10/20/2021 4:21 PM  Brianna Adams  has presented today for surgery, with the diagnosis of pulmonary HTN.  The various methods of treatment have been discussed with the patient and family. After consideration of risks, benefits and other options for treatment, the patient has consented to  Procedure(s): RIGHT HEART CATH (N/A) as a surgical intervention.  The patient's history has been reviewed, patient examined, no change in status, stable for surgery.  I have reviewed the patient's chart and labs.  Questions were answered to the patient's satisfaction.     Glenetta Hew

## 2021-10-20 NOTE — H&P (View-Only) (Signed)
°Cardiology Office Note: Outpatient Procedure H&P  ° °Date:  10/20/2021  ° °ID:  Brianna Adams, DOB 06/03/1941, MRN [id number] ° °PCP:  Adams, Janece, [name]  °Cardiologist:  None  °Electrophysiologist:  None  ° °Referring MD: Adams, Janece, [name]  ° °Chief Complaint  °Patient presents with  ° Pulmonary hypertension  ° ° °History of Present Illness:   ° °Brianna Adams is a 80 y.o. female with a hx of chronic diastolic heart failure, COPD, PE on Eliquis, T2DM who presents for follow-up.  She was referred by Brianna Moore, [name] for evaluation of heart failure, initially seen on 01/08/2021.  She was admitted from 4/5 through 12/10/2020.  She presented with worsening shortness of breath.  Had to increase her home oxygen from 3 to 4 L.  Reports she had not been taking her Lasix regularly.  Chest x-ray showed pulmonary edema, BNP 24.  Echocardiogram 12/10/2020 showed LVEF 55 to 60%, grade 1 diastolic dysfunction, moderate RV enlargement, severe pulmonary hypertension (RVSP 85 mmHg).  She received IV diuresis with improvement in symptoms and was discharged on Lasix 20 mg daily.  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  Follows with pulmonology.  Echocardiogram 10/08/2021 showed EF 60 to 65%, interventricular septal flattening in systole and diastole consistent with RV pressure and volume overload, moderate RV systolic dysfunction, moderate RV enlargement, severely elevated pulmonary pressures (RVSP 90), mild to moderate TR, RAP 3. ° °Since last clinic visit, she reports she has been doing OK.  Does report has been having dyspnea.  Denies any chest pain, lightheadedness, syncope, lower extremity edema.  Reports has been having palpitations about 1-2 times per month, will last for 15 to 20 minutes, feels like heart is racing. ° ° °Wt Readings from Last 3 Encounters:  °10/20/21 134 lb (60.8 kg)  °10/16/21 134 lb 6.4 oz (61 kg)  °10/06/21 130 lb 12.8 oz (59.3 kg)  ° ° ° °Past Medical History:  °Diagnosis Date  ° CHF  (congestive heart failure) (HCC)   ° COPD (chronic obstructive pulmonary disease) (HCC)   ° Diabetes (HCC)   ° type 2  ° Diverticulitis   ° Dyspnea   ° On Oxygen 3L via [name]   ° GERD (gastroesophageal reflux disease)   ° High cholesterol   ° Lung cancer (HCC)   ° Mild non proliferative diabetic retinopathy (HCC) 08/05/2021  ° PE (pulmonary thromboembolism) (HCC) 02/29/2020  ° Pulmonary hypertension (HCC)   ° Uterine cancer (HCC)   ° ° °Past Surgical History:  °Procedure Laterality Date  ° ABDOMINAL HYSTERECTOMY    ° cataract surgery  2012 and 2017  ° COLONOSCOPY    ° FIBEROPTIC BRONCHOSCOPY    ° FLEXIBLE SIGMOIDOSCOPY N/A 08/13/2021  ° Procedure: FLEXIBLE SIGMOIDOSCOPY;  Surgeon: Dorsey, Ying C, MD;  Location: [facility];  Service: Gastroenterology;  Laterality: N/A;  ° GANGLION CYST EXCISION Left 09/11/2020  ° Procedure: EXCISION VOLAR RADIAL GANGLION OF LEFT WRIST;  Surgeon: Kuzma, Gary, MD;  Location: [facility];  Service: Orthopedics;  Laterality: Left;  AXILLARY BLOCK  ° resection of lung cancer  2018  ° right lower lobe non-anatomocal lung resection wedge    ° right thorascoscopy    ° ° °Current Medications: °Current Meds  °Medication Sig  ° acetaminophen (TYLENOL) 500 MG tablet Take 1,000 mg by mouth every 6 (six) hours as needed for mild pain or fever.  ° albuterol (VENTOLIN HFA) 108 (90 Base) MCG/ACT inhaler Inhale 2 puffs into the lungs every 6 (six)   hours as needed for wheezing or shortness of breath.   apixaban (ELIQUIS) 2.5 MG TABS tablet Take 1 tablet (2.5 mg total) by mouth 2 (two) times daily.   CALCIUM PO Take 1,200 mg by mouth daily.   Cinnamon 500 MG TABS Take 1,000 mg by mouth daily.    docusate sodium (COLACE) 100 MG capsule Take 1 capsule (100 mg total) by mouth daily. (Patient not taking: Reported on 10/16/2021)   doxycycline (VIBRA-TABS) 100 MG tablet Take 1 tablet (100 mg total) by mouth 2 (two) times daily. (Patient not taking: Reported on 10/16/2021)   ferrous sulfate 325 (65 FE) MG EC tablet  Take 1 tablet (325 mg total) by mouth 3 (three) times daily with meals. (Patient not taking: Reported on 10/16/2021)   Fluticasone-Umeclidin-Vilant (TRELEGY ELLIPTA) 100-62.5-25 MCG/ACT AEPB INHALE 1 PUFF INTO THE LUNGS DAILY   furosemide (LASIX) 20 MG tablet TAKE 1 TABLET BY MOUTH EVERY DAY   JARDIANCE 10 MG TABS tablet TAKE 1 TABLET BY MOUTH DAILY BEFORE BREAKFAST.   metFORMIN (GLUCOPHAGE) 1000 MG tablet Take 1 tablet (1,000 mg total) by mouth 2 (two) times daily with a meal.   Multiple Vitamins-Minerals (CENTRUM SILVER 50+WOMEN PO) Take 1 tablet by mouth daily.   Multiple Vitamins-Minerals (OCUVITE EYE HEALTH FORMULA PO) Take 1 capsule by mouth daily.   omeprazole (PRILOSEC) 40 MG capsule TAKE 1 CAPSULE BY MOUTH EVERY DAY   pravastatin (PRAVACHOL) 10 MG tablet TAKE 1 TABLET BY MOUTH EVERY DAY   vitamin C (ASCORBIC ACID) 500 MG tablet Take 500 mg by mouth every other day. Tues Thurs Sat and Sun   vitamin E 180 MG (400 UNITS) capsule Take 400 Units by mouth every Monday, Wednesday, and Friday.   [DISCONTINUED] Pseudoephedrine-guaiFENesin ([place] D PO) Take by mouth. nightly     Allergies:   Ace inhibitors, Lisinopril, Penicillin g, Atorvastatin, Cortisone, Hydrocortisone, Meloxicam, and Pravastatin   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r-sr Pharmacist, hospital  Tobacco Use   Smoking status: Former    Packs/day: 1.00    Years: 58.00    Pack years: 58.00    Types: Cigarettes    Quit date: 10/22/2015    Years since quitting: 6.0   Smokeless tobacco: Never   Tobacco comments:    Warehouse manager Use: Never used  Substance and Sexual Activity   Alcohol use: Never   Drug use: Never   Sexual activity: Not Currently     Birth control/protection: Surgical    Comment: HYSTERECTOMY  Other Topics Concern   Not on file  Social History Narrative   Not on file   Social Determinants of Health   Financial Resource Strain: Low Risk    Difficulty of Paying Living Expenses: Not hard at all  Food Insecurity: No Food Insecurity   Worried About Charity fundraiser in the Last Year: Never true   [place] in the Last Year: Never true  Transportation Needs: No Transportation Needs   Lack of Transportation (Medical): No   Lack of Transportation (Non-Medical): No  Physical Activity: Not on file  Stress: Not on file  Social Connections: Not on file  Family History: °The patient's family history includes Cancer in her maternal aunt; Heart attack in her father; Hypertension in her mother. There is no history of Stomach cancer, Colon cancer, Esophageal cancer, or Pancreatic cancer. ° °ROS:   °Please see the history of present illness.    °All other systems reviewed and are negative. ° °EKGs/Labs/Other Studies Reviewed:   ° °The following studies were reviewed today: ° ° °EKG:   °01/08/21: Sinus rhythm. Rate 75 bpm. No ST abnormalities, Left atrial enlargement ° °Recent Labs: °03/09/2021: B Natriuretic Peptide 341.7 °05/24/2021: ALT 12 °10/06/2021: Magnesium 2.1 °10/20/2021: BUN 18; Creatinine, Ser 0.82; Hemoglobin 14.2; Platelets 250; Potassium 4.0; Sodium 137  °Recent Lipid Panel °   °Component Value Date/Time  ° CHOL 151 03/10/2021 0620  ° CHOL 189 03/03/2021 1150  ° TRIG 80 03/10/2021 0620  ° HDL 51 03/10/2021 0620  ° HDL 74 03/03/2021 1150  ° CHOLHDL 3.0 03/10/2021 0620  ° VLDL 16 03/10/2021 0620  ° LDLCALC 84 03/10/2021 0620  ° LDLCALC 102 (H) 03/03/2021 1150  ° ° °Physical Exam:   ° °VS:  BP 108/68    Pulse 77    Ht 5' 1" (1.549 m)    Wt 134 lb 6.4 oz (61 kg)    SpO2 96%    BMI 25.39 kg/m²    ° °Wt Readings from Last 3 Encounters:  °10/20/21 134 lb (60.8 kg)  °10/16/21 134 lb 6.4 oz (61 kg)  °10/06/21 130 lb 12.8 oz (59.3  kg)  °  ° °GEN: Well nourished, well developed in no acute distress °HEENT: Normal °NECK: No JVD; No carotid bruits °LYMPHATICS: No lymphadenopathy °CARDIAC: RRR, no murmurs, rubs, gallops °RESPIRATORY:  Clear to auscultation without rales, wheezing or rhonchi  °ABDOMEN: Soft, non-tender, non-distended °MUSCULOSKELETAL:  No edema; No deformity  °SKIN: Warm and dry °NEUROLOGIC:  Alert and oriented x 3 °PSYCHIATRIC:  Normal affect  ° °ASSESSMENT:   ° °1. Pulmonary hypertension, unspecified (HCC)   °2. Chronic diastolic CHF (congestive heart failure) (HCC)   °3. Palpitations   °4. Hyperlipidemia, unspecified hyperlipidemia type   ° ° ° °PLAN:   ° °Chronic diastolic heart failure: EF 12/2020 with normal EF, moderately enlarged RV, normal RV function, severe pulmonary hypertension °-Continue Lasix 20 mg daily.   °-Continue Jardiance 10 mg daily ° °Pulmonary hypertension: RVSP 85 mmHg on echo 12/2020.  Likely combination of group 2 and group 3 PH.  Also could potentially be group 4 given history of PE.  Follows with pulmonology.  Echocardiogram 10/08/2021 showed EF 60 to 65%, interventricular septal flattening in systole and diastole consistent with RV pressure and volume overload, moderate RV systolic dysfunction, moderate RV enlargement, severely elevated pulmonary pressures (RVSP 90), mild to moderate TR, RAP 3. °-Recommend RHC for further evaluation. Risks/benefits of RHC discussed with patient and she is willing to proceed. ° °Palpitations: Description concerning for arrhythmia, evaluate with Zio patch x 2 weeks ° °Hyperlipidemia: On pravastatin 10 mg daily.  LDL 84 on 03/10/2021 ° °PE: on lifelong anticoagulation.  On 2.5 mg dose Eliquis due to anemia and issues with rectal bleeding ° °RTC in 3 months ° ° °Medication Adjustments/Labs and Tests Ordered: °Current medicines are reviewed at length with the patient today.  Concerns regarding medicines are outlined above.  °No orders of the defined types were placed in this  encounter. ° °No orders of the defined types were placed in this encounter. ° ° °Patient Instructions  °Medication Instructions:  °Continue same medications °*If you need   a refill on your cardiac medications before your next appointment, please call your pharmacy*   Lab Work: None ordered   Testing/Procedures: Right Heart Cath    Follow instructions below      Signed, Oswaldo Milian L. MD [facility]   10/20/2021  Patient seen and evaluated on 10/20/2021  See updated H&P note.  Glenetta Hew, MD

## 2021-10-21 ENCOUNTER — Ambulatory Visit (INDEPENDENT_AMBULATORY_CARE_PROVIDER_SITE_OTHER): Payer: Medicare Other

## 2021-10-21 ENCOUNTER — Encounter (HOSPITAL_COMMUNITY): Payer: Self-pay | Admitting: Cardiology

## 2021-10-21 ENCOUNTER — Other Ambulatory Visit: Payer: Self-pay | Admitting: Oncology

## 2021-10-21 ENCOUNTER — Ambulatory Visit: Payer: Medicare Other | Admitting: Nurse Practitioner

## 2021-10-21 ENCOUNTER — Other Ambulatory Visit: Payer: Self-pay | Admitting: Pulmonary Disease

## 2021-10-21 VITALS — Ht 61.0 in | Wt 134.0 lb

## 2021-10-21 DIAGNOSIS — Z0000 Encounter for general adult medical examination without abnormal findings: Secondary | ICD-10-CM | POA: Diagnosis not present

## 2021-10-21 NOTE — Patient Instructions (Signed)
Ms. Brianna Adams , Thank you for taking time to come for your Medicare Wellness Visit. I appreciate your ongoing commitment to your health goals. Please review the following plan we discussed and let me know if I can assist you in the future.   Screening recommendations/referrals: Colonoscopy: not required Mammogram: completed 02/26/2021, due 02/27/2022 Bone Density: completed 08/23/2017 Recommended yearly ophthalmology/optometry visit for glaucoma screening and checkup Recommended yearly dental visit for hygiene and checkup  Vaccinations: Influenza vaccine: completed 06/01/2021, due next flu season Pneumococcal vaccine: completed 04/07/2015 Tdap vaccine: completed 08/17/2017, due 08/18/2027 Shingles vaccine: completed   Covid-19: 06/01/2021, 06/11/2020, 11/19/2019, 10/20/2019  Advanced directives: Please bring a copy of your POA (Power of Attorney) and/or Living Will to your next appointment.   Conditions/risks identified: none  Next appointment: Follow up in one year for your annual wellness visit    Preventive Care 65 Years and Older, Female Preventive care refers to lifestyle choices and visits with your health care provider that can promote health and wellness. What does preventive care include? A yearly physical exam. This is also called an annual well check. Dental exams once or twice a year. Routine eye exams. Ask your health care provider how often you should have your eyes checked. Personal lifestyle choices, including: Daily care of your teeth and gums. Regular physical activity. Eating a healthy diet. Avoiding tobacco and drug use. Limiting alcohol use. Practicing safe sex. Taking low-dose aspirin every day. Taking vitamin and mineral supplements as recommended by your health care provider. What happens during an annual well check? The services and screenings done by your health care provider during your annual well check will depend on your age, overall health, lifestyle risk  factors, and family history of disease. Counseling  Your health care provider may ask you questions about your: Alcohol use. Tobacco use. Drug use. Emotional well-being. Home and relationship well-being. Sexual activity. Eating habits. History of falls. Memory and ability to understand (cognition). Work and work Statistician. Reproductive health. Screening  You may have the following tests or measurements: Height, weight, and BMI. Blood pressure. Lipid and cholesterol levels. These may be checked every 5 years, or more frequently if you are over 71 years old. Skin check. Lung cancer screening. You may have this screening every year starting at age 8 if you have a 30-pack-year history of smoking and currently smoke or have quit within the past 15 years. Fecal occult blood test (FOBT) of the stool. You may have this test every year starting at age 99. Flexible sigmoidoscopy or colonoscopy. You may have a sigmoidoscopy every 5 years or a colonoscopy every 10 years starting at age 69. Hepatitis C blood test. Hepatitis B blood test. Sexually transmitted disease (STD) testing. Diabetes screening. This is done by checking your blood sugar (glucose) after you have not eaten for a while (fasting). You may have this done every 1-3 years. Bone density scan. This is done to screen for osteoporosis. You may have this done starting at age 77. Mammogram. This may be done every 1-2 years. Talk to your health care provider about how often you should have regular mammograms. Talk with your health care provider about your test results, treatment options, and if necessary, the need for more tests. Vaccines  Your health care provider may recommend certain vaccines, such as: Influenza vaccine. This is recommended every year. Tetanus, diphtheria, and acellular pertussis (Tdap, Td) vaccine. You may need a Td booster every 10 years. Zoster vaccine. You may need this after age 88.  Pneumococcal 13-valent  conjugate (PCV13) vaccine. One dose is recommended after age 88. Pneumococcal polysaccharide (PPSV23) vaccine. One dose is recommended after age 53. Talk to your health care provider about which screenings and vaccines you need and how often you need them. This information is not intended to replace advice given to you by your health care provider. Make sure you discuss any questions you have with your health care provider. Document Released: 09/19/2015 Document Revised: 05/12/2016 Document Reviewed: 06/24/2015 Elsevier Interactive Patient Education  2017 [employer] Prevention in the Home Falls can cause injuries. They can happen to people of all ages. There are many things you can do to make your home safe and to help prevent falls. What can I do on the outside of my home? Regularly fix the edges of walkways and driveways and fix any cracks. Remove anything that might make you trip as you walk through a door, such as a raised step or threshold. Trim any bushes or trees on the path to your home. Use bright outdoor lighting. Clear any walking paths of anything that might make someone trip, such as rocks or tools. Regularly check to see if handrails are loose or broken. Make sure that both sides of any steps have handrails. Any raised decks and porches should have guardrails on the edges. Have any leaves, snow, or ice cleared regularly. Use sand or salt on walking paths during winter. Clean up any spills in your garage right away. This includes oil or grease spills. What can I do in the bathroom? Use night lights. Install grab bars by the toilet and in the tub and shower. Do not use towel bars as grab bars. Use non-skid mats or decals in the tub or shower. If you need to sit down in the shower, use a plastic, non-slip stool. Keep the floor dry. Clean up any water that spills on the floor as soon as it happens. Remove soap buildup in the tub or shower regularly. Attach bath mats  securely with double-sided non-slip rug tape. Do not have throw rugs and other things on the floor that can make you trip. What can I do in the bedroom? Use night lights. Make sure that you have a light by your bed that is easy to reach. Do not use any sheets or blankets that are too big for your bed. They should not hang down onto the floor. Have a firm chair that has side arms. You can use this for support while you get dressed. Do not have throw rugs and other things on the floor that can make you trip. What can I do in the kitchen? Clean up any spills right away. Avoid walking on wet floors. Keep items that you use a lot in easy-to-reach places. If you need to reach something above you, use a strong step stool that has a grab bar. Keep electrical cords out of the way. Do not use floor polish or wax that makes floors slippery. If you must use wax, use non-skid floor wax. Do not have throw rugs and other things on the floor that can make you trip. What can I do with my stairs? Do not leave any items on the stairs. Make sure that there are handrails on both sides of the stairs and use them. Fix handrails that are broken or loose. Make sure that handrails are as long as the stairways. Check any carpeting to make sure that it is firmly attached to the stairs. Fix any  carpet that is loose or worn. Avoid having throw rugs at the top or bottom of the stairs. If you do have throw rugs, attach them to the floor with carpet tape. Make sure that you have a light switch at the top of the stairs and the bottom of the stairs. If you do not have them, ask someone to add them for you. What else can I do to help prevent falls? Wear shoes that: Do not have high heels. Have rubber bottoms. Are comfortable and fit you well. Are closed at the toe. Do not wear sandals. If you use a stepladder: Make sure that it is fully opened. Do not climb a closed stepladder. Make sure that both sides of the stepladder  are locked into place. Ask someone to hold it for you, if possible. Clearly mark and make sure that you can see: Any grab bars or handrails. First and last steps. Where the edge of each step is. Use tools that help you move around (mobility aids) if they are needed. These include: Canes. Walkers. Scooters. Crutches. Turn on the lights when you go into a dark area. Replace any light bulbs as soon as they burn out. Set up your furniture so you have a clear path. Avoid moving your furniture around. If any of your floors are uneven, fix them. If there are any pets around you, be aware of where they are. Review your medicines with your doctor. Some medicines can make you feel dizzy. This can increase your chance of falling. Ask your doctor what other things that you can do to help prevent falls. This information is not intended to replace advice given to you by your health care provider. Make sure you discuss any questions you have with your health care provider. Document Released: 06/19/2009 Document Revised: 01/29/2016 Document Reviewed: 09/27/2014 Elsevier Interactive Patient Education  2017 [employer].

## 2021-10-21 NOTE — Progress Notes (Signed)
I connected with Brianna Adams today by telephone and verified that I am speaking with the correct person using two identifiers. Location patient: home Location provider: work Persons participating in the virtual visit: [name] LPN.   I discussed the limitations, risks, security and privacy concerns of performing an evaluation and management service by telephone and the availability of in person appointments. I also discussed with the patient that there may be a patient responsible charge related to this service. The patient expressed understanding and verbally consented to this telephonic visit.    Interactive audio and video telecommunications were attempted between this provider and patient, however failed, due to patient having technical difficulties OR patient did not have access to video capability.  We continued and completed visit with audio only.     Vital signs may be patient reported or missing.  Subjective:   Brianna Adams is a 81 y.o. female who presents for Medicare Annual (Subsequent) preventive examination.  Review of Systems     Cardiac Risk Factors include: advanced age (>29mn, >>36women);diabetes mellitus;hypertension     Objective:    Today's Vitals   10/21/21 0858  Weight: 134 lb (60.8 kg)  Height: _0  (1.549 m)   Body mass index is 25.32 kg/m.  Advanced Directives 10/21/2021 10/20/2021 08/13/2021 07/16/2021 06/24/2021 05/24/2021 03/09/2021  Does Patient Have a Medical Advance Directive? _1  No No  Type of Advance Directive Living will Living will;Healthcare Power of AAdrianLiving will Living will;Healthcare Power of AHazelOut of facility DNR (pink MOST or yellow form) HVadoLiving will - -  Does patient want to make changes to medical advance directive? - - - No - Patient declined - - -  Copy of [place]? - - Yes - validated most recent  copy scanned in chart (See row information) - - - -  Would patient like information on creating a medical advance directive? - No - Patient declined - No - Patient declined - No - Patient declined No - Patient declined    Current Medications (verified) Outpatient Encounter Medications as of 10/21/2021  Medication Sig   acetaminophen (TYLENOL) 500 MG tablet Take 1,000 mg by mouth every 6 (six) hours as needed for mild pain or fever.   albuterol (VENTOLIN HFA) 108 (90 Base) MCG/ACT inhaler Inhale 2 puffs into the lungs every 6 (six) hours as needed for wheezing or shortness of breath.   apixaban (ELIQUIS) 2.5 MG TABS tablet Take 1 tablet (2.5 mg total) by mouth 2 (two) times daily.   CALCIUM PO Take 1,200 mg by mouth daily.   Cinnamon 500 MG TABS Take 1,000 mg by mouth daily.    Fluticasone-Umeclidin-Vilant (TRELEGY ELLIPTA) 100-62.5-25 MCG/ACT AEPB INHALE 1 PUFF INTO THE LUNGS DAILY   furosemide (LASIX) 20 MG tablet TAKE 1 TABLET BY MOUTH EVERY DAY   JARDIANCE 10 MG TABS tablet TAKE 1 TABLET BY MOUTH DAILY BEFORE BREAKFAST.   losartan (COZAAR) 25 MG tablet Take 12.5 mg by mouth daily.   metFORMIN (GLUCOPHAGE) 1000 MG tablet Take 1 tablet (1,000 mg total) by mouth 2 (two) times daily with a meal.   Multiple Vitamins-Minerals (CENTRUM SILVER 50+WOMEN PO) Take 1 tablet by mouth daily.   Multiple Vitamins-Minerals (OCUVITE EYE HEALTH FORMULA PO) Take 1 capsule by mouth daily.   omeprazole (PRILOSEC) 40 MG capsule TAKE 1 CAPSULE BY MOUTH EVERY DAY   pravastatin (PRAVACHOL) 10 MG tablet TAKE 1 TABLET  BY MOUTH EVERY DAY   vitamin C (ASCORBIC ACID) 500 MG tablet Take 500 mg by mouth every other day. Tues Thurs Sat and Sun   vitamin E 180 MG (400 UNITS) capsule Take 400 Units by mouth every Monday, Wednesday, and Friday.   docusate sodium (COLACE) 100 MG capsule Take 1 capsule (100 mg total) by mouth daily. (Patient not taking: Reported on 10/16/2021)   doxycycline (VIBRA-TABS) 100 MG tablet Take 1  tablet (100 mg total) by mouth 2 (two) times daily. (Patient not taking: Reported on 10/16/2021)   ferrous sulfate 325 (65 FE) MG EC tablet Take 1 tablet (325 mg total) by mouth 3 (three) times daily with meals. (Patient not taking: Reported on 10/16/2021)   pseudoephedrine-guaifenesin (MUCINEX D) 60-600 MG 12 hr tablet Take 1 tablet by mouth at bedtime. (Patient not taking: Reported on 10/21/2021)   No facility-administered encounter medications on file as of 10/21/2021.    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   PE (pulmonary thromboembolism) ([place]) 02/29/2020   Pulmonary hypertension ([place])    Uterine cancer [facility])    Past Surgical History:  Procedure Laterality Date   ABDOMINAL HYSTERECTOMY     cataract surgery  2012 and 2017   COLONOSCOPY     FIBEROPTIC BRONCHOSCOPY     FLEXIBLE SIGMOIDOSCOPY N/A 08/13/2021   Procedure: FLEXIBLE SIGMOIDOSCOPY;  Surgeon: Sharyn Creamer, MD;  Location: [facility];  Service: Gastroenterology;  Laterality: N/A;   GANGLION CYST EXCISION Left 09/11/2020   Procedure: EXCISION VOLAR RADIAL GANGLION OF LEFT WRIST;  Surgeon: Daryll Brod, MD;  Location: [facility];  Service: Orthopedics;  Laterality: Left;  AXILLARY BLOCK   resection of lung cancer  2018   RIGHT HEART CATH N/A 10/20/2021   Procedure: RIGHT HEART CATH;  Surgeon: Leonie Man, MD;  Location: [facility];  Service: Cardiovascular;  Laterality: N/A;   right lower lobe non-anatomocal lung resection wedge     right thorascoscopy      Family History  Problem Relation Age of Onset   Hypertension Mother    Heart attack Father    Cancer Maternal Aunt        unknown type   Stomach cancer Neg Hx    Colon cancer Neg Hx    Esophageal cancer Neg Hx    Pancreatic cancer Neg Hx    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r-sr Pharmacist, hospital  Tobacco Use   Smoking status: Former    Packs/day: 1.00    Years: 58.00    Pack years: 58.00    Types: Cigarettes    Quit date: 10/22/2015    Years since quitting: 6.0    Passive exposure: Past  Smokeless tobacco: Never   Tobacco comments:    congratulated  Vaping Use   Vaping Use: Never used  Substance and Sexual Activity   Alcohol use: Never   Drug use: Never   Sexual activity: Not Currently    Birth control/protection: Surgical    Comment: HYSTERECTOMY  Other Topics Concern   Not on file  Social History Narrative   Not on file   Social Determinants of Health   Financial Resource Strain: Low Risk    Difficulty of Paying Living Expenses: Not hard at all  Food Insecurity: No Food Insecurity   Worried About Charity fundraiser in the Last Year: Never true   Frank in the Last Year: Never true  Transportation Needs: No Transportation Needs   Lack of Transportation (Medical): No   Lack of Transportation (Non-Medical): No  Physical Activity: Insufficiently Active   Days of Exercise per Week: 3 days   Minutes of Exercise per Session: 40 min  Stress: No Stress Concern Present   Feeling of Stress : Not at all  Social Connections: Not on file    Tobacco Counseling Counseling given: Not Answered Tobacco comments: congratulated   Clinical Intake:  Pre-visit preparation completed: Yes  Pain : No/denies  pain     Nutritional Status: BMI 25 -29 Overweight Nutritional Risks: None Diabetes: Yes  How often do you need to have someone help you when you read instructions, pamphlets, or other written materials from your doctor or pharmacy?: 1 - Never What is the last grade level you completed in school?: bachelor'e degree  Diabetic? Yes Nutrition Risk Assessment:  Has the patient had any N/V/D within the last 2 months?  No  Does the patient have any non-healing wounds?  No  Has the patient had any unintentional weight loss or weight gain?  No   Diabetes:  Is the patient diabetic?  Yes  If diabetic, was a CBG obtained today?  No  Did the patient bring in their glucometer from home?  No  How often do you monitor your CBG's? Does not check.   Financial Strains and Diabetes Management:  Are you having any financial strains with the device, your supplies or your medication? No .  Does the patient want to be seen by Chronic Care Management for management of their diabetes?  No  Would the patient like to be referred to a Nutritionist or for Diabetic Management?  No   Diabetic Exams:  Diabetic Eye Exam: Completed 08/04/2021 Diabetic Foot Exam: Completed 10/09/2020   Interpreter Needed?: No  Information entered by :: NAllen LPN   Activities of Daily Living In your present state of health, do you have any difficulty performing the following activities: 10/21/2021 10/20/2021  Hearing? N -  Vision? N -  Difficulty concentrating or making decisions? N -  Walking or climbing stairs? N N  Dressing or bathing? N -  Doing errands, shopping? N -  Preparing Food and eating ? N -  Using the Toilet? N -  In the past six months, have you accidently leaked urine? N -  Do you have problems with loss of bowel control? N -  Managing your Medications? N -  Managing your Finances? N -  Housekeeping or managing your Housekeeping? N -  Some recent data might be hidden    Patient Care Team: [name], [name] as PCP - General (General Practice)  Indicate any recent Medical Services you may have received from other than  [facility] providers in the past year (date may be approximate).     Assessment:   This is a routine wellness examination for Brianna Adams.  Hearing/Vision screen Vision Screening - Comments:: Regular eye exams, [facility]  Dietary issues and exercise activities discussed: Current Exercise Habits: Home exercise routine, Type of exercise: strength training/weights;calisthenics, Time (Minutes): 45, Frequency (Times/Week): 3, Weekly Exercise (Minutes/Week): 135   Goals Addressed             This Visit's Progress    Patient Stated       10/21/2021, wants breathing to improve       Depression Screen PHQ 2/9 Scores 10/21/2021 10/21/2020 10/09/2020 04/21/2020 10/04/2019 10/04/2019 06/14/2019  PHQ - 2 Score 0 0 0 0 0 0 0  PHQ- 9 Score - 0 - 0 0 - -    Fall Risk Fall Risk  10/21/2021 10/09/2020 04/03/2020 02/28/2020 10/04/2019  Falls in the past year? 0 0 0 0 0  Number falls in past yr: - - 0 - -  Injury with Fall? - - 0 - -  Risk for fall due to : Medication side effect Medication side effect Impaired balance/gait No Fall Risks Medication side effect  Follow up Falls evaluation completed;Education provided;Falls prevention discussed Falls evaluation completed;Education provided;Falls prevention discussed Falls evaluation completed Falls prevention discussed Falls evaluation completed;Education provided;Falls prevention discussed    FALL RISK PREVENTION PERTAINING TO THE HOME:  Any stairs in or around the home? Yes  If so, are there any without handrails?  N/a Home free of loose throw rugs in walkways, pet beds, electrical cords, etc? Yes  Adequate lighting in your home to reduce risk of falls? Yes   ASSISTIVE DEVICES UTILIZED TO PREVENT FALLS:  Life alert? No  Use of a cane, walker or w/c? No  Grab bars in the bathroom? Yes  Shower chair or bench in shower?  Yes  Elevated toilet seat or a handicapped toilet? Yes   TIMED UP AND GO:  Was the test performed? No .      Cognitive Function:     6CIT Screen 10/21/2021 10/09/2020 10/04/2019  What Year? 0 points 0 points 0 points  What month? 0 points 0 points 0 points  What time? 0 points 0 points 3 points  Count back from 20 0 points 0 points 0 points  Months in reverse 0 points 0 points 0 points  Repeat phrase 8 points 6 points 2 points  Total Score _0 Immunizations Immunization History  Administered Date(s) Administered   Fluad Quad(high Dose 65+) 05/24/2019, 05/13/2020   Influenza, High Dose Seasonal PF 06/01/2021   Influenza-Unspecified 06/06/2018   PFIZER(Purple Top)SARS-COV-2 Vaccination 10/20/2019, 11/19/2019, 06/11/2020   Pfizer Covid-19 Vaccine Bivalent Booster 55yr & up 06/01/2021   Pneumococcal Conjugate-13 10/31/2013   Pneumococcal Polysaccharide-23 12/05/1997, 11/05/2007, 04/07/2015   Td 09/07/2003   Tdap 08/07/2015, 08/17/2017   Zoster Recombinat (Shingrix) 08/17/2017, 06/28/2019, 09/05/2019   Zoster, Live 11/05/2010    TDAP status: Up to date  Flu Vaccine status: Up to date  Pneumococcal vaccine status: Up to date  Covid-19 vaccine status: Completed vaccines  Qualifies for Shingles Vaccine? Yes   Zostavax completed Yes   Shingrix Completed?: Yes  Screening Tests Health Maintenance  Topic Date Due   FOOT EXAM  10/09/2021   MAMMOGRAM  02/26/2022   HEMOGLOBIN A1C  04/05/2022   OPHTHALMOLOGY EXAM  08/04/2022   TETANUS/TDAP  08/18/2027   Pneumonia Vaccine 81 Years old  Completed   INFLUENZA VACCINE  Completed   DEXA SCAN  Completed   COVID-19 Vaccine  Completed   Zoster Vaccines- Shingrix  Completed   HPV VACCINES  Aged Out    Health Maintenance  Health Maintenance Due  Topic Date Due   FOOT EXAM  10/09/2021    Colorectal cancer screening: No longer required.   Mammogram status: Completed 02/26/2021. Repeat every year  Bone Density  status: Completed 08/23/2017.   Lung Cancer Screening: (Low Dose CT Chest recommended if Age 59-80 years, 30 pack-year currently smoking OR have quit w/in 15years.) does not qualify.   Lung Cancer Screening Referral: no  Additional Screening:  Hepatitis C Screening: does not qualify;   Vision Screening: Recommended annual ophthalmology exams for early detection of glaucoma and other disorders of the eye. Is the patient up to date with their annual eye exam?  Yes  Who is the provider or what is the name of the office in which the patient attends annual eye exams? [facility] If pt is not established with a provider, would they like to be referred to a provider to establish care? No .   Dental Screening: Recommended annual dental exams for proper oral hygiene  Community Resource Referral / Chronic Care Management: CRR required this visit?  No   CCM required this visit?  No      Plan:     I have personally reviewed and noted the following in the patients chart:   Medical and social history Use of alcohol, tobacco or illicit drugs  Current medications and supplements including opioid prescriptions.  Functional ability and status Nutritional status Physical activity Advanced directives List of other physicians Hospitalizations, surgeries, and ER visits in previous 12 months Vitals Screenings to include cognitive, depression, and falls Referrals and appointments  In addition, I have reviewed and discussed with patient certain preventive protocols, quality metrics, and best practice recommendations. A written personalized care plan for preventive services as well as general preventive health recommendations were provided to patient.     Kellie Simmering, LPN   02/26/6332   Nurse Notes: none  Due to this being a virtual visit, the after visit summary with patients personalized plan was offered to patient via mail or my-chart. Patient would like to access on  my-chart

## 2021-10-22 ENCOUNTER — Encounter: Payer: Self-pay | Admitting: Oncology

## 2021-10-22 ENCOUNTER — Other Ambulatory Visit: Payer: Self-pay

## 2021-10-22 ENCOUNTER — Inpatient Hospital Stay: Payer: Medicare Other | Attending: Oncology

## 2021-10-22 ENCOUNTER — Inpatient Hospital Stay (HOSPITAL_BASED_OUTPATIENT_CLINIC_OR_DEPARTMENT_OTHER): Payer: Medicare Other | Admitting: Oncology

## 2021-10-22 VITALS — BP 103/57 | HR 100 | Temp 97.0°F | Resp 18 | Wt 133.0 lb

## 2021-10-22 DIAGNOSIS — Z809 Family history of malignant neoplasm, unspecified: Secondary | ICD-10-CM | POA: Insufficient documentation

## 2021-10-22 DIAGNOSIS — Z85118 Personal history of other malignant neoplasm of bronchus and lung: Secondary | ICD-10-CM | POA: Insufficient documentation

## 2021-10-22 DIAGNOSIS — Z86711 Personal history of pulmonary embolism: Secondary | ICD-10-CM

## 2021-10-22 DIAGNOSIS — E119 Type 2 diabetes mellitus without complications: Secondary | ICD-10-CM | POA: Insufficient documentation

## 2021-10-22 DIAGNOSIS — D500 Iron deficiency anemia secondary to blood loss (chronic): Secondary | ICD-10-CM | POA: Insufficient documentation

## 2021-10-22 DIAGNOSIS — Z87891 Personal history of nicotine dependence: Secondary | ICD-10-CM | POA: Insufficient documentation

## 2021-10-22 DIAGNOSIS — J9611 Chronic respiratory failure with hypoxia: Secondary | ICD-10-CM | POA: Diagnosis not present

## 2021-10-22 LAB — CBC
HCT: 39.6 % (ref 36.0–46.0)
Hemoglobin: 12.5 g/dL (ref 12.0–15.0)
MCH: 27.7 pg (ref 26.0–34.0)
MCHC: 31.6 g/dL (ref 30.0–36.0)
MCV: 87.8 fL (ref 80.0–100.0)
Platelets: 271 10*3/uL (ref 150–400)
RBC: 4.51 MIL/uL (ref 3.87–5.11)
RDW: 19 % — ABNORMAL HIGH (ref 11.5–15.5)
WBC: 6 10*3/uL (ref 4.0–10.5)
nRBC: 0 % (ref 0.0–0.2)

## 2021-10-22 LAB — IRON AND TIBC
Iron: 31 ug/dL (ref 28–170)
Saturation Ratios: 8 % — ABNORMAL LOW (ref 10.4–31.8)
TIBC: 379 ug/dL (ref 250–450)
UIBC: 348 ug/dL

## 2021-10-22 LAB — FERRITIN: Ferritin: 24 ng/mL (ref 11–307)

## 2021-10-22 MED ORDER — APIXABAN 2.5 MG PO TABS: 2.5000 mg | ORAL_TABLET | Freq: Two times a day (BID) | ORAL | 5 refills | Status: DC

## 2021-10-22 NOTE — Progress Notes (Signed)
Hematology/Oncology follow up note  [phone] [phone]   Patient Care Team: [name], [name] as PCP - General (General Practice)  REFERRING PROVIDER: [name], [name]  CHIEF COMPLAINTS/REASON FOR VISIT:   history of pulmonary embolism.  HISTORY OF PRESENTING ILLNESS:   Brianna Adams is a  81 y.o.  female with PMH listed below was seen in consultation at the request of  [name], [name]  for evaluation of history of pulmonary embolism.  Patient has been on oxygen via nasal cannula at home due to pulmonary hypertension.  She also has an early stage lung cancer status post a right lower lobe resection. 02/29/2020 patient presented emergency room for evaluation of shortness of breath.  Patient was found to have segmental PE of the right lower lobe with evidence of right heart strain.  And new area of the consolidation in the left lower lobe concerning for pneumonia.  Lower extremity ultrasound was negative for DVT.   Patient also was treated for pneumonia, acute decompensated CHF.Marland Kitchen  Patient was placed on heparin and discharged on Eliquis.  Patient takes Eliquis 5 mg twice daily since then on 05/08/2021, when she developed rectal bleeding.  Eliquis was decreased to 2.5 mg twice daily.  Patient currently is on nasal cannula oxygen 4 L.  Patient was accompanied by her daughter today.  Daughter reports history of long distance car trip-6 hours  prior to patient's PE diagnosis.Daughter is not sure about exactly the timeframe of immobilization event.  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  Patient follows up with Dr. [name] for surveillance, lung emphysema, chronic respiratory failure.  Patient denies any additional episodes of rectal bleeding since decrease to Eliquis 2.5 mg twice daily. Per patient and her daughter, patient has  been quite active despite chronic respiratory failure.  Per daughter, patient walks faster than her. Patient has. been referred to establish care with gastroenterology.  INTERVAL HISTORY Brianna Adams is a 81 y.o. female who has above history reviewed by me today presents for follow up visit for history of pulmonary embolism. Denies any additional rectal bleeding events while on Eliquis 2.5 mg daily. She tolerates well.   Chronic respiratory failure, on nasal cannula oxygen 08/13/2021, flexible sigmoidoscopy showed diverticulosis.  Nonbleeding internal hemorrhoids.  No specimen was collected.  Review of Systems  Constitutional:  Negative for appetite change, chills, fatigue and fever.  HENT:   Negative for hearing loss and voice change.   Eyes:  Negative for eye problems.  Respiratory:  Positive for shortness of breath. Negative for chest tightness, cough and hemoptysis.   Cardiovascular:  Negative for chest pain.  Gastrointestinal:  Negative for abdominal distention, abdominal pain and blood in stool.  Endocrine: Negative for hot flashes.  Genitourinary:  Negative for difficulty urinating and frequency.   Musculoskeletal:  Negative for arthralgias.  Skin:  Negative for itching and rash.  Neurological:  Negative for extremity weakness.  Hematological:  Negative for adenopathy.  Psychiatric/Behavioral:  Negative for confusion.    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   PE (pulmonary thromboembolism) ([place]) 02/29/2020   Pulmonary hypertension ([place])    Uterine cancer ([place])     SURGICAL HISTORY: Past Surgical History:  Procedure Laterality Date    ABDOMINAL HYSTERECTOMY     cataract surgery  2012 and 2017   COLONOSCOPY     FIBEROPTIC BRONCHOSCOPY     FLEXIBLE SIGMOIDOSCOPY N/A 08/13/2021   Procedure: FLEXIBLE SIGMOIDOSCOPY;  Surgeon: Sharyn Creamer, MD;  Location: [facility];  Service: Gastroenterology;  Laterality: N/A;   GANGLION CYST EXCISION Left 09/11/2020   Procedure: EXCISION VOLAR RADIAL GANGLION OF LEFT WRIST;  Surgeon: Daryll Brod, MD;  Location: [place];  Service: Orthopedics;  Laterality: Left;  AXILLARY BLOCK   resection of lung cancer  2018   RIGHT HEART CATH N/A 10/20/2021   Procedure: RIGHT HEART CATH;  Surgeon: Leonie Man, MD;  Location: [facility];  Service: Cardiovascular;  Laterality: N/A;   right lower lobe non-anatomocal lung resection wedge     right thorascoscopy      SOCIAL HISTORY: 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r-sr Pharmacist, hospital  Tobacco Use   Smoking status: Former    Packs/day: 1.00    Years: 58.00    Pack years: 58.00    Types: Cigarettes    Quit date: 10/22/2015    Years since quitting: 6.0    Passive exposure: Past   Smokeless tobacco: Never   Tobacco comments:    Warehouse manager Use: Never used  Substance and Sexual Activity   Alcohol use: Never   Drug use: Never   Sexual activity: Not Currently    Birth control/protection: Surgical    Comment: HYSTERECTOMY  Other Topics Concern   Not on file  Social History Narrative   Not on file   Social Determinants of Health   Financial Resource Strain: Low Risk    Difficulty of Paying Living Expenses: Not hard at all  Food Insecurity: No Food Insecurity   Worried About Charity fundraiser in the Last Year: Never true   [place] in the Last  Year: Never true  Transportation Needs: No Transportation Needs   Lack of Transportation (Medical): No   Lack of Transportation (Non-Medical): No  Physical Activity: Insufficiently Active   Days of Exercise per Week: 3 days   Minutes of Exercise per Session: 40 min  Stress: No Stress Concern Present   Feeling of Stress : Not at all  Social Connections: Not on file  Intimate Partner Violence: Not on file    FAMILY HISTORY: Family History  Problem Relation Age of Onset   Hypertension Mother    Heart attack Father    Cancer Maternal Aunt        unknown type   Stomach cancer Neg Hx    Colon cancer Neg Hx    Esophageal cancer Neg Hx    Pancreatic cancer Neg Hx     ALLERGIES:  is allergic to ace inhibitors, lisinopril, penicillin g, atorvastatin, cortisone, hydrocortisone, meloxicam, and pravastatin.  MEDICATIONS:  Current Outpatient Medications  Medication  Sig Dispense Refill   acetaminophen (TYLENOL) 500 MG tablet Take 1,000 mg by mouth every 6 (six) hours as needed for mild pain or fever.     albuterol (VENTOLIN HFA) 108 (90 Base) MCG/ACT inhaler Inhale 2 puffs into the lungs every 6 (six) hours as needed for wheezing or shortness of breath. 8 g 2   CALCIUM PO Take 1,200 mg by mouth daily.     Cinnamon 500 MG TABS Take 1,000 mg by mouth daily.      ferrous sulfate 325 (65 FE) MG EC tablet Take 1 tablet (325 mg total) by mouth 3 (three) times daily with meals. (Patient taking differently: Take 325 mg by mouth 2 (two) times daily.) 60 tablet 2   Fluticasone-Umeclidin-Vilant (TRELEGY ELLIPTA) 100-62.5-25 MCG/ACT AEPB INHALE 1 PUFF INTO THE LUNGS DAILY 60 each 2   furosemide (LASIX) 20 MG tablet TAKE 1 TABLET BY MOUTH EVERY DAY 90 tablet 2   JARDIANCE 10 MG TABS tablet TAKE 1 TABLET BY MOUTH DAILY BEFORE BREAKFAST. 30 tablet 3   losartan (COZAAR) 25 MG tablet Take 12.5 mg by mouth daily.     metFORMIN (GLUCOPHAGE) 1000 MG tablet Take 1 tablet (1,000 mg total) by mouth 2 (two) times  daily with a meal. 180 tablet 1   Multiple Vitamins-Minerals (CENTRUM SILVER 50+WOMEN PO) Take 1 tablet by mouth daily.     Multiple Vitamins-Minerals (OCUVITE EYE HEALTH FORMULA PO) Take 1 capsule by mouth daily.     omeprazole (PRILOSEC) 40 MG capsule TAKE 1 CAPSULE BY MOUTH EVERY DAY 90 capsule 1   pravastatin (PRAVACHOL) 10 MG tablet TAKE 1 TABLET BY MOUTH EVERY DAY 90 tablet 1   pseudoephedrine-guaifenesin (MUCINEX D) 60-600 MG 12 hr tablet Take 1 tablet by mouth at bedtime.     vitamin C (ASCORBIC ACID) 500 MG tablet Take 500 mg by mouth every other day. Tues Thurs Sat and Sun     vitamin E 180 MG (400 UNITS) capsule Take 400 Units by mouth every Monday, Wednesday, and Friday.     apixaban (ELIQUIS) 2.5 MG TABS tablet Take 1 tablet (2.5 mg total) by mouth 2 (two) times daily. 60 tablet 5   docusate sodium (COLACE) 100 MG capsule Take 1 capsule (100 mg total) by mouth daily. (Patient not taking: Reported on 10/16/2021) 60 capsule 1   No current facility-administered medications for this visit.     PHYSICAL EXAMINATION: ECOG PERFORMANCE STATUS: 1 - Symptomatic but completely ambulatory Vitals:   10/22/21 1342  BP: (!) 103/57  Pulse: 100  Resp: 18  Temp: (!) 97 F (36.1 C)  SpO2: 95%   Filed Weights   10/22/21 1342  Weight: 133 lb (60.3 kg)    Physical Exam Constitutional:      General: She is not in acute distress.    Comments: Patient walks with a cane at baseline.  HENT:     Head: Normocephalic and atraumatic.  Eyes:     General: No scleral icterus. Cardiovascular:     Rate and Rhythm: Normal rate and regular rhythm.     Heart sounds: Normal heart sounds.  Pulmonary:     Effort: Pulmonary effort is normal. No respiratory distress.     Breath sounds: No wheezing.     Comments: Decreased breath sound bilaterally.  Patient breathes comfortably via nasal cannula oxygen Abdominal:     General: Bowel sounds are normal. There is no distension.     Palpations: Abdomen is  soft.  Musculoskeletal:  General: No deformity. Normal range of motion.     Cervical back: Normal range of motion and neck supple.  Skin:    General: Skin is warm and dry.     Findings: No erythema or rash.  Neurological:     Mental Status: She is alert and oriented to person, place, and time. Mental status is at baseline.     Cranial Nerves: No cranial nerve deficit.     Coordination: Coordination normal.  Psychiatric:        Mood and Affect: Mood normal.    LABORATORY DATA:  I have reviewed the data as listed Lab Results  Component Value Date   WBC 6.0 10/22/2021   HGB 12.5 10/22/2021   HCT 39.6 10/22/2021   MCV 87.8 10/22/2021   PLT 271 10/22/2021   Recent Labs    03/09/21 1137 03/10/21 0620 05/24/21 0730 05/24/21 1356 10/06/21 1221 10/20/21 1231 10/20/21 1710  NA 132* 136 136  --  140 137 140   140  K 3.9 3.7 4.4  --  4.7 4.0 3.8   3.9  CL 96* 103 101  --  99 103  --   CO2 _0 --  23 26  --   GLUCOSE 244* 149* 141*  --  132* 130*  --   BUN _1 --  20 18  --   CREATININE 1.25* 0.91 0.86  --  0.84 0.82  --   CALCIUM 9.5 9.2 9.6  --  10.4* 9.5  --   GFRNONAA 43* >60 >60  --   --  >60  --   PROT 7.9  --  7.1 6.9  --   --   --   ALBUMIN 3.8  --  3.7 3.5  --   --   --   AST 20  --  18 13*  --   --   --   ALT 14  --  12 12  --   --   --   ALKPHOS 53  --  45 46  --   --   --   BILITOT 0.9  --  0.5 0.6  --   --   --   BILIDIR  --   --   --  <0.1  --   --   --   IBILI  --   --   --  NOT CALCULATED  --   --   --     Iron/TIBC/Ferritin/ %Sat    Component Value Date/Time   IRON 31 10/22/2021 1323   TIBC 379 10/22/2021 1323   FERRITIN 24 10/22/2021 1323   IRONPCTSAT 8 (L) 10/22/2021 1323       RADIOGRAPHIC STUDIES: I have personally reviewed the radiological images as listed and agreed with the findings in the report. DG Chest 2 View  Result Date: 09/01/2021 CLINICAL DATA:  Productive cough and shortness of breath for the past week. EXAM:  CHEST - 2 VIEW COMPARISON:  Chest x-ray dated April 13, 2021. FINDINGS: Unchanged borderline cardiomegaly. Similar emphysematous changes and chronically coarsened interstitial markings with scarring/treated tumor in the peripheral left lung. No focal consolidation, pleural effusion, or pneumothorax. Similar postsurgical changes in the right lower lobe. No acute osseous abnormality. IMPRESSION: 1. No acute cardiopulmonary disease. Electronically Signed   By: Titus Dubin M.D.   On: 09/01/2021 12:20   CARDIAC CATHETERIZATION  Result Date: 10/20/2021   Hemodynamic findings consistent with Moderate-Severe Pulmonary Hypertension.  Mean PAP  42 mmHg (75/21 mmHg)   Elevated PCWP: 30/36 mmHg - 24 mmHg; transpulmonary gradient 70-96 mmHg   Conflicting data on Cardiac Output and Index by Fick versus TD (suggests valvular disease, possibly tricuspid regurgitation)   ECHOCARDIOGRAM COMPLETE  Result Date: 10/08/2021    ECHOCARDIOGRAM REPORT   Patient Name:   Brianna Adams Date of Exam: 10/08/2021 Medical Rec #:  [id number]     Height:       61.0 in Accession #:    [id number]    Weight:       130.8 lb Date of Birth:  1941-07-29      BSA:          1.577 m Patient Age:    61 years      BP:           116/58 mmHg Patient Gender: F             HR:           85 bpm. Exam Location:  [address] Procedure: 2D Echo, 3D Echo, Cardiac Doppler and Color Doppler Indications:    I27.2 Pulmonary hypertension  History:        Patient has prior history of Echocardiogram examinations, most                 recent 12/10/2020. CHF, Pulmonary HTN and COPD; Risk                 Factors:Hypertension, Diabetes, Dyslipidemia and Former Smoker.                 Pulmonary embolus. CKD stage 2. Anemia.  Sonographer:    Basilia Jumbo BS, RDCS Referring Phys: [phone] [place]  1. Left ventricular ejection fraction, by estimation, is 60 to 65%. Left ventricular ejection fraction by 3D volume is 62 %. The left ventricle has normal  function. The left ventricle has no regional wall motion abnormalities. There is mild left ventricular hypertrophy of the basal-septal segment. Left ventricular diastolic parameters are consistent with Grade I diastolic dysfunction (impaired relaxation). There is the interventricular septum is flattened in systole and diastole, consistent with  right ventricular pressure and volume overload.  2. Right ventricular systolic function is moderately reduced. The right ventricular size is moderately enlarged. There is severely elevated pulmonary artery systolic pressure. The estimated right ventricular systolic pressure is 17.0 mmHg.  3. The mitral valve is normal in structure. Trivial mitral valve regurgitation. No evidence of mitral stenosis.  4. Tricuspid valve regurgitation is mild to moderate.  5. The aortic valve is tricuspid. Aortic valve regurgitation is not visualized. Aortic valve sclerosis/calcification is present, without any evidence of aortic stenosis.  6. The inferior vena cava is normal in size with greater than 50% respiratory variability, suggesting right atrial pressure of 3 mmHg. Comparison(s): 12/10/20 EF 55-60%. PA pressure 66mHg. FINDINGS  Left Ventricle: Left ventricular ejection fraction, by estimation, is 60 to 65%. Left ventricular ejection fraction by 3D volume is 62 %. The left ventricle has normal function. The left ventricle has no regional wall motion abnormalities. The left ventricular internal cavity size was normal in size. There is mild left ventricular hypertrophy of the basal-septal segment. The interventricular septum is flattened in systole and diastole, consistent with right ventricular pressure and volume overload.  Left ventricular diastolic parameters are consistent with Grade I diastolic dysfunction (impaired relaxation). Normal left ventricular filling pressure. Right Ventricle: The right ventricular size is moderately enlarged. No increase in right ventricular wall  thickness.  Right ventricular systolic function is moderately reduced. There is severely elevated pulmonary artery systolic pressure. The tricuspid regurgitant velocity is 4.66 m/s, and with an assumed right atrial pressure of 3 mmHg, the estimated right ventricular systolic pressure is 54.5 mmHg. Left Atrium: Left atrial size was normal in size. Right Atrium: Right atrial size was normal in size. Pericardium: There is no evidence of pericardial effusion. Mitral Valve: The mitral valve is normal in structure. Trivial mitral valve regurgitation. No evidence of mitral valve stenosis. Tricuspid Valve: The tricuspid valve is normal in structure. Tricuspid valve regurgitation is mild to moderate. No evidence of tricuspid stenosis. Aortic Valve: The aortic valve is tricuspid. Aortic valve regurgitation is not visualized. Aortic valve sclerosis/calcification is present, without any evidence of aortic stenosis. Pulmonic Valve: The pulmonic valve was normal in structure. Pulmonic valve regurgitation is mild to moderate. No evidence of pulmonic stenosis. Aorta: The aortic root is normal in size and structure. Venous: The inferior vena cava is normal in size with greater than 50% respiratory variability, suggesting right atrial pressure of 3 mmHg. IAS/Shunts: No atrial level shunt detected by color flow Doppler.  LEFT VENTRICLE PLAX 2D LVIDd:         3.50 cm         Diastology LVIDs:         2.50 cm         LV e' medial:    9.68 cm/s LV PW:         1.00 cm         LV E/e' medial:  6.1 LV IVS:        1.10 cm         LV e' lateral:   12.40 cm/s LVOT diam:     2.30 cm         LV E/e' lateral: 4.8 LV SV:         71 LV SV Index:   45 LVOT Area:     4.15 cm        3D Volume EF                                LV 3D EF:    Left                                             ventricul                                             ar                                             ejection                                             fraction  by 3D                                             volume is                                             62 %.                                 3D Volume EF:                                3D EF:        62 %                                LV EDV:       92 ml                                LV ESV:       35 ml                                LV SV:        57 ml RIGHT VENTRICLE             IVC RV Basal diam:  4.30 cm     IVC diam: 2.00 cm RV Mid diam:    3.70 cm RV S prime:     12.30 cm/s TAPSE (M-mode): 1.9 cm RVSP:           89.9 mmHg LEFT ATRIUM             Index        RIGHT ATRIUM           Index LA diam:        3.30 cm 2.09 cm/m   RA Pressure: 3.00 mmHg LA Vol (A2C):   41.5 ml 26.32 ml/m  RA Area:     10.30 cm LA Vol (A4C):   28.4 ml 18.01 ml/m  RA Volume:   21.90 ml  13.89 ml/m LA Biplane Vol: 36.1 ml 22.89 ml/m  AORTIC VALVE LVOT Vmax:   96.90 cm/s LVOT Vmean:  60.300 cm/s LVOT VTI:    0.171 m  AORTA Ao Root diam: 2.90 cm Ao Asc diam:  2.60 cm MITRAL VALVE               TRICUSPID VALVE                            TR Peak grad:   86.9 mmHg MV Decel Time: 268 msec    TR Vmax:        466.00 cm/s MV E velocity: 59.50 cm/s  Estimated RAP:  3.00 mmHg MV A velocity: 83.30 cm/s  RVSP:           89.9 mmHg MV E/A ratio:  0.71  SHUNTS                            Systemic VTI:  0.17 m                            Systemic Diam: 2.30 cm Fransico Him MD Electronically signed by Fransico Him MD Signature Date/Time: 10/08/2021/1:34:19 PM    Final        ASSESSMENT & PLAN:  1. Iron deficiency anemia due to chronic blood loss   2. History of pulmonary embolism   3. History of lung cancer   4. Chronic respiratory failure with hypoxia (HCC)    #History of pulmonary embolism, possible provoked I recommend patient to continue Eliquis 2.5 mg twice daily given her multiple other contributing factors including history of cancer, sedentary lifestyle, limited mobility due to  chronic respiratory failure. She tolerates well with no additional bleeding events.   #Iron deficiency anemia, Iron panel showed ferritin level 24, iron saturation 8.  Hemoglobin has normalized. Continue oral iron supplementation ferrous sulfate 325 mg twice daily.  Prescription was sent.  #History of lung cancer,  01/16/2021 CT scan showed no evidence of disease recurrence. Patient follows up with pulmonology for annual CT scan.  No orders of the defined types were placed in this encounter.   All questions were answered. The patient knows to call the clinic with any problems questions or concerns.  cc [name], [name]   I recommend patient to make a follow-up appointment with [place] in 6 months with lab, MD.  Earlie Server, MD, PhD [facility] Hematology Oncology 10/22/2021

## 2021-10-22 NOTE — Progress Notes (Signed)
Pt here for follow up. No new concerns voiced.

## 2021-10-27 ENCOUNTER — Other Ambulatory Visit: Payer: Self-pay

## 2021-10-27 ENCOUNTER — Encounter: Payer: Self-pay | Admitting: Nurse Practitioner

## 2021-10-27 MED ORDER — BENZONATATE 100 MG PO CAPS: 100.0000 mg | ORAL_CAPSULE | Freq: Three times a day (TID) | ORAL | 1 refills | Status: DC | PRN

## 2021-11-02 ENCOUNTER — Other Ambulatory Visit: Payer: Self-pay | Admitting: Nurse Practitioner

## 2021-11-15 ENCOUNTER — Other Ambulatory Visit: Payer: Self-pay | Admitting: Nurse Practitioner

## 2021-11-15 DIAGNOSIS — R051 Acute cough: Secondary | ICD-10-CM

## 2021-11-17 IMAGING — RF DG ESOPHAGUS
7 of 10 series · 12 of 19 positions shown · non-contrast
Comparison: None.

CLINICAL DATA: Coughing when eating

EXAM:
ESOPHOGRAM/BARIUM SWALLOW
TECHNIQUE: Combined double contrast and single contrast examination performed
using effervescent crystals, thick barium liquid, and thin barium
liquid.
FLUOROSCOPY TIME:  Fluoroscopy Time:  1 minutes 6 seconds
Radiation Exposure Index (if provided by the fluoroscopic device):
10.6 mGy
Number of Acquired Spot Images: 0

[Series 1: cp_standard · 0.51mm/px · 3 of 224 frames shown (1 of 7)]
[frame 6/224]
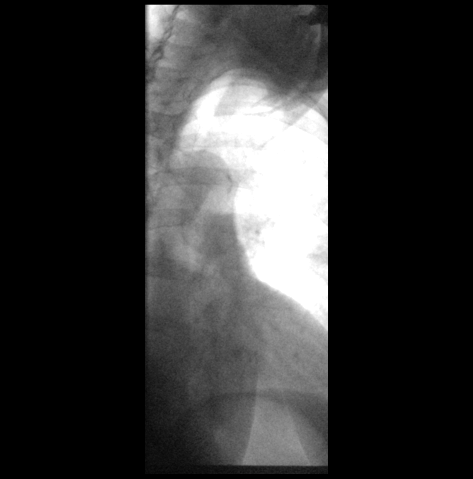
[frame 113/224]
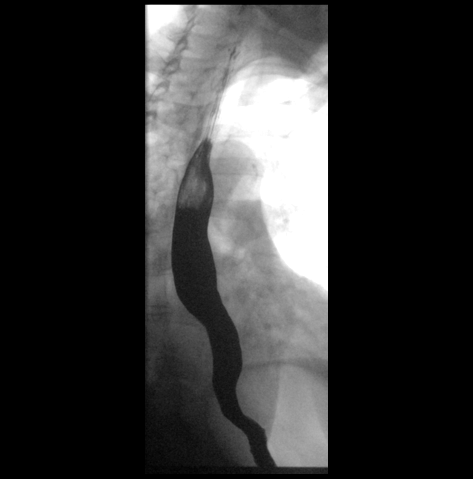
[frame 191/224]
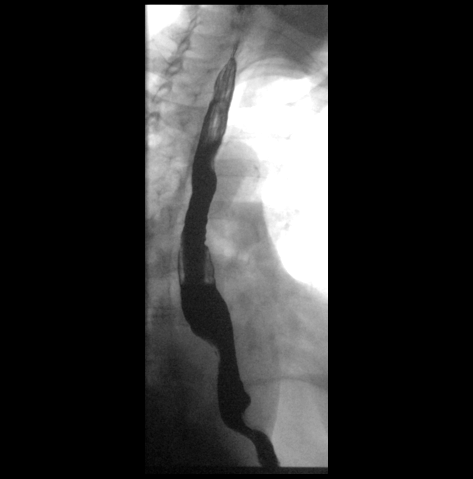

[Series 2: cp_standard · 0.51mm/px · 2 of 128 frames shown (2 of 7)]
[frame 65/128]
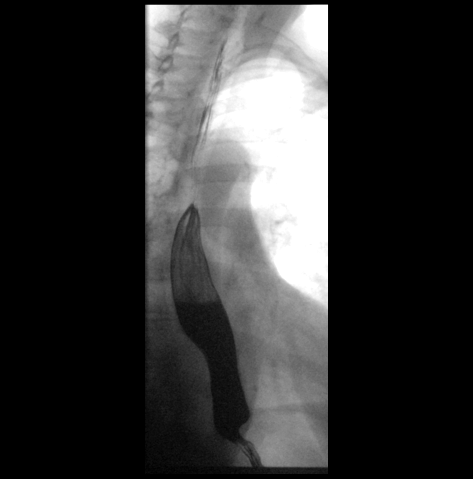
[frame 110/128]
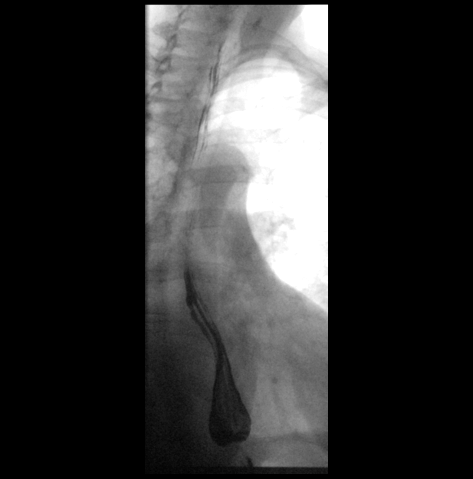

[Series 3: cp_standard · 0.51mm/px · 3 of 140 frames shown (3 of 7)]
[frame 2/140]
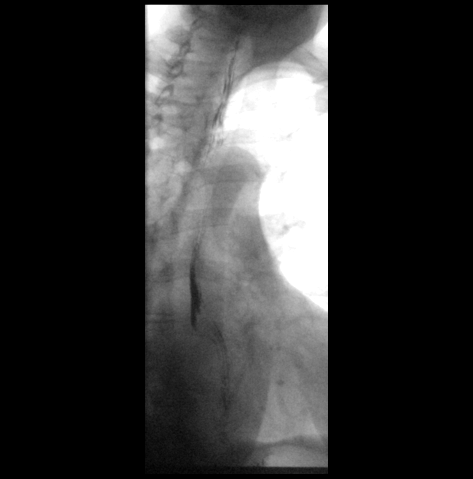
[frame 71/140]
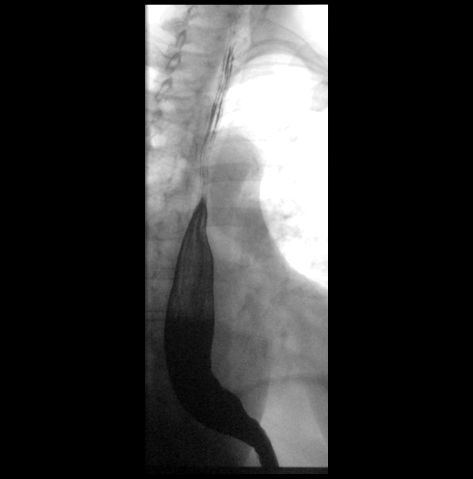
[frame 120/140]
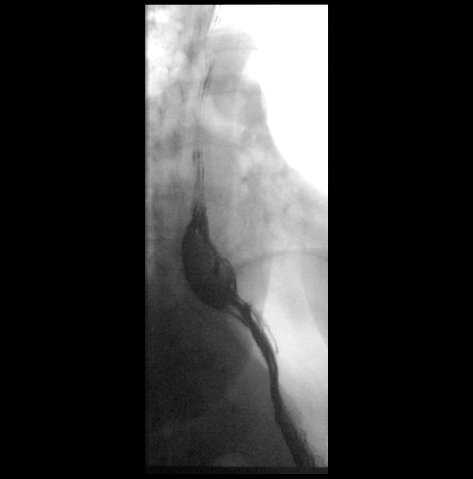

[Series 5: cp_standard · 0.27mm/px · 1 of 1 slices shown (4 of 7)]
[im 1/1]
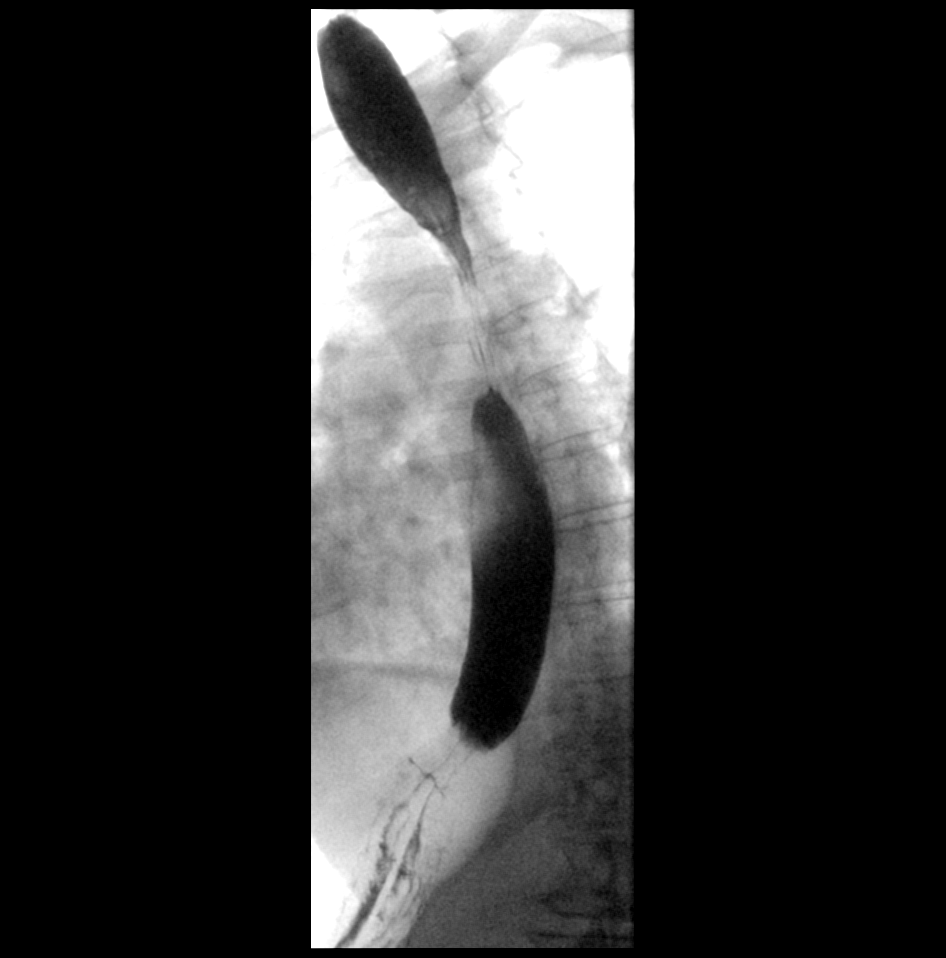

[Series 7: cp_standard · 0.27mm/px · 1 of 1 slices shown (5 of 7)]
[im 1/1]
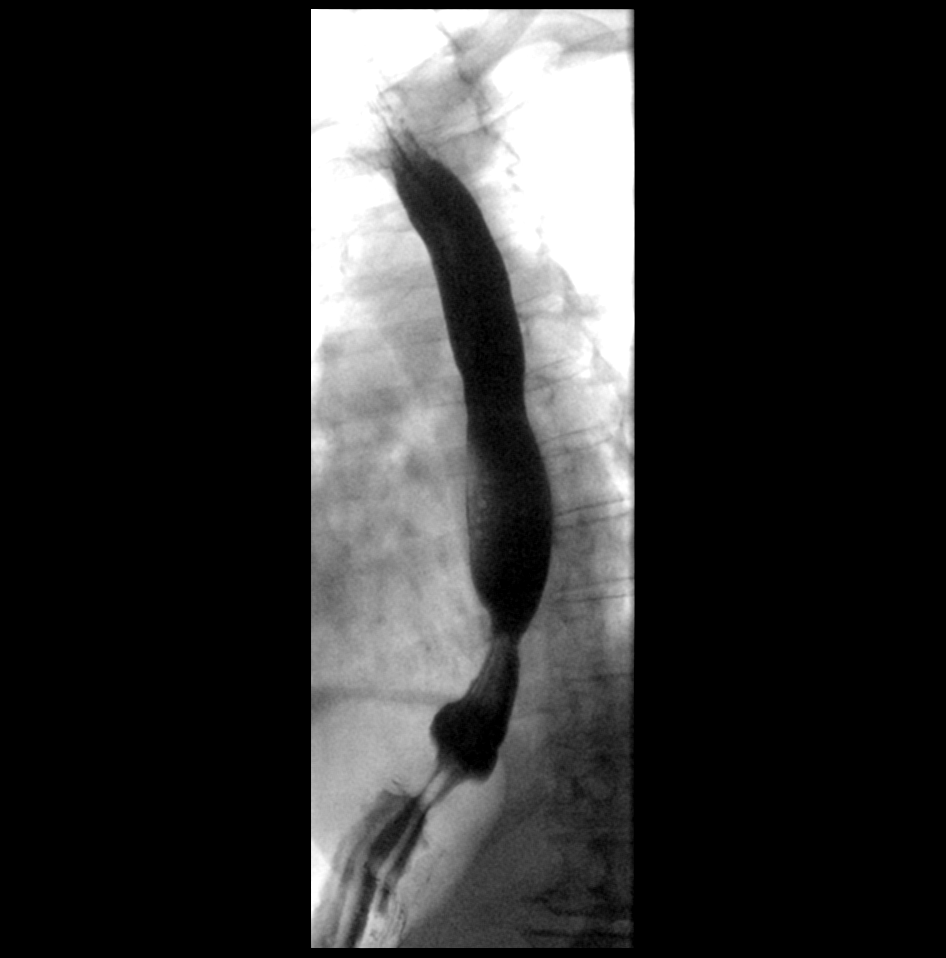

[Series 8: cp_standard · 0.27mm/px · 1 of 1 slices shown (6 of 7)]
[im 1/1]
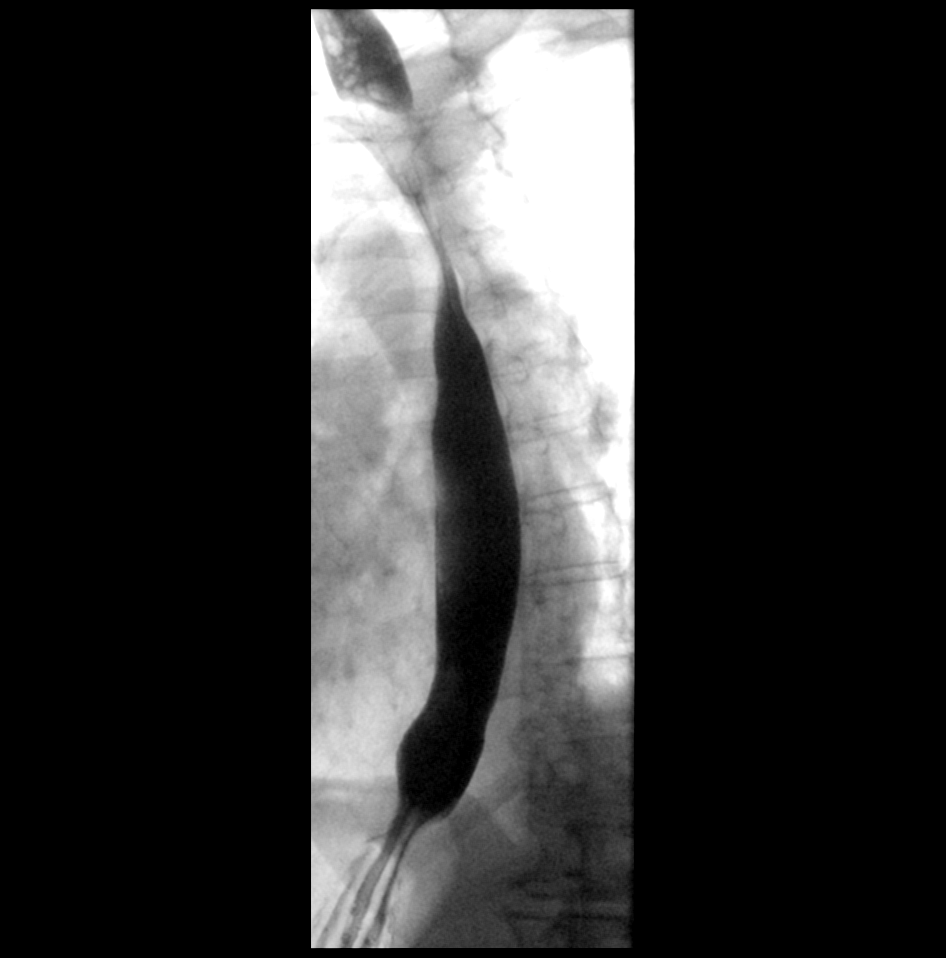

[Series 10: cp_standard · 0.26mm/px · 1 of 1 slices shown (7 of 7)]
[im 1/1]
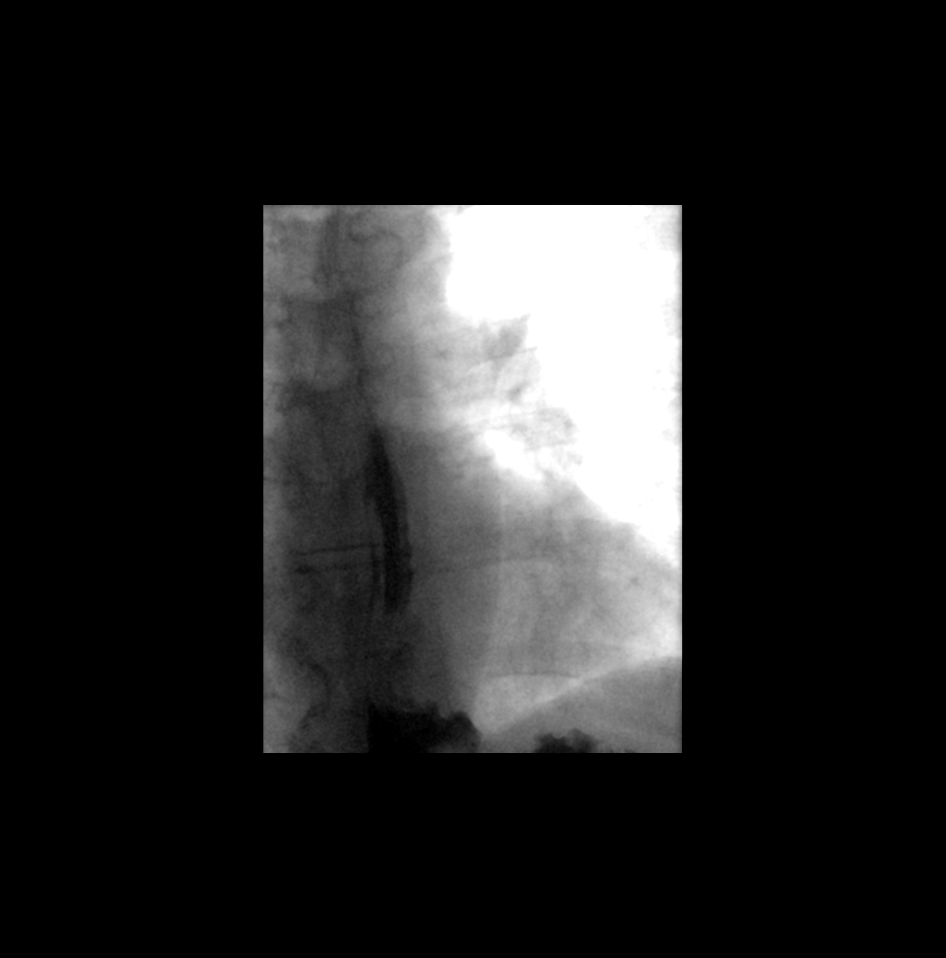

[12 of 19 positions shown; findings below may reference images not displayed]

FINDINGS: Patient swallowed barium without difficulty. There is no mass,
stricture, or other abnormality identified. Normal motility. No
hiatal hernia. Mild spontaneous gastroesophageal reflux.
IMPRESSION: Mild spontaneous gastroesophageal reflux. Otherwise unremarkable
study.

## 2021-11-19 ENCOUNTER — Other Ambulatory Visit: Payer: Self-pay | Admitting: Nurse Practitioner

## 2021-11-19 ENCOUNTER — Other Ambulatory Visit: Payer: Self-pay | Admitting: Oncology

## 2021-12-21 ENCOUNTER — Telehealth: Payer: Self-pay | Admitting: Adult Health

## 2021-12-21 NOTE — Telephone Encounter (Signed)
ATC patient's daughter, Benita(dpr). Unable to leave vm due to mailbox being full. ?Please offer appt with NP in [place] if Benita calls back. Thanks ? ?

## 2021-12-24 ENCOUNTER — Other Ambulatory Visit (HOSPITAL_COMMUNITY): Payer: Self-pay

## 2021-12-24 ENCOUNTER — Ambulatory Visit (INDEPENDENT_AMBULATORY_CARE_PROVIDER_SITE_OTHER): Payer: Medicare Other | Admitting: Nurse Practitioner

## 2021-12-24 ENCOUNTER — Ambulatory Visit (INDEPENDENT_AMBULATORY_CARE_PROVIDER_SITE_OTHER): Payer: Medicare Other

## 2021-12-24 ENCOUNTER — Encounter: Payer: Self-pay | Admitting: Nurse Practitioner

## 2021-12-24 ENCOUNTER — Telehealth: Payer: Self-pay

## 2021-12-24 VITALS — BP 116/64 | HR 84 | Ht 62.0 in | Wt 133.8 lb

## 2021-12-24 DIAGNOSIS — I272 Pulmonary hypertension, unspecified: Secondary | ICD-10-CM

## 2021-12-24 DIAGNOSIS — Z87891 Personal history of nicotine dependence: Secondary | ICD-10-CM

## 2021-12-24 DIAGNOSIS — J9611 Chronic respiratory failure with hypoxia: Secondary | ICD-10-CM

## 2021-12-24 DIAGNOSIS — C3490 Malignant neoplasm of unspecified part of unspecified bronchus or lung: Secondary | ICD-10-CM | POA: Diagnosis not present

## 2021-12-24 DIAGNOSIS — J441 Chronic obstructive pulmonary disease with (acute) exacerbation: Secondary | ICD-10-CM

## 2021-12-24 DIAGNOSIS — I5032 Chronic diastolic (congestive) heart failure: Secondary | ICD-10-CM

## 2021-12-24 MED ORDER — SPACER/AERO-HOLDING CHAMBERS DEVI: 2 refills | Status: DC

## 2021-12-24 MED ORDER — PREDNISONE 10 MG PO TABS: ORAL_TABLET | ORAL | 0 refills | Status: DC

## 2021-12-24 MED ORDER — BREZTRI AEROSPHERE 160-9-4.8 MCG/ACT IN AERO: 2.0000 | INHALATION_SPRAY | Freq: Two times a day (BID) | RESPIRATORY_TRACT | 0 refills | Status: DC

## 2021-12-24 MED ORDER — ALBUTEROL SULFATE (2.5 MG/3ML) 0.083% IN NEBU: 2.5000 mg | INHALATION_SOLUTION | Freq: Four times a day (QID) | RESPIRATORY_TRACT | 12 refills | Status: DC | PRN

## 2021-12-24 NOTE — Assessment & Plan Note (Signed)
AECOPD vs progression of disease. Prednisone taper. Trial change to [facility] from Trelegy; concern unable to generate airflow for DPI. Provided with samples and advised to notify if breathing improves so we can send rx. No cough or chest congestion noted. CXR to evaluate for superimposed infection.  ? ?Patient Instructions  ?Stop Trelegy. Trial Breztri 2 puffs Twice daily with spacer for two weeks. Brush tongue and rinse mouth afterwards. If you notice improvement in your breathing, please let me know and we will send a prescription. ?Continue Albuterol inhaler 2 puffs or 3 mL neb every 6 hours as needed for shortness of breath or wheezing. Notify if symptoms persist despite rescue inhaler/neb use. ?Continue supplemental oxygen 4 lpm for goal oxygen level >88-90% ? ?Prednisone taper. 4 tabs for 2 days, then 3 tabs for 2 days, 2 tabs for 2 days, then 1 tab for 2 days, then stop. Take in AM with food  ? ?Chest x ray today. We will notify you of any abnormal results.  ? ?Follow up in one month with Dr. [name] or Katie Ramie Erman,NP. If symptoms do not improve or worsen, please contact office for sooner follow up or seek emergency care. ? ? ?

## 2021-12-24 NOTE — Progress Notes (Signed)
? ?_0  ID: Brianna Adams, female    DOB: 1940-12-28, 81 y.o.   MRN: [id number] ? ?Chief Complaint  ?Patient presents with  ? Follow-up  ?  Labored breathing  ? ? ?Referring provider: ?[name], [name] ? ?HPI: ?81 year old female, former smoker (86 pack years) followed for COPD with chronic bronchitis and emphysema, chronic respiratory failure. She is a patient of Dr. [name] and last seen in office on 07/21/2021 by Par[place],NP. She has a past history of lung cancer s/p RLL resection May 2018, and 2019 radiation to new upper lobe nodule. Past medical history significant for PE on Eliquis, HTN, pulmonary HTN, chronic diastolic CHF, GERD, DM II, CKD stage II, HLD, IDA, multinodular goiter.  ? ?TEST/EVENTS:  ?01/16/2021 CT chest wo contrast: overall the CT chest is stable without evidence of local recurrence or metastatic disease. There is stable radiation fibrosis in the lingula with adjacent chronic fx of the left 5th rib. Stable postsurgical changes in the RLL. There are stable borderline enlarged mediastinal and hilar lymph nodes. Atherosclerosis. Heterogenous enlargement of the thyroid gland, consistent with multinodular goiter. Thyroid US advised.   ? ?12/24/2021: Today - acute visit ?Patient presents today for worsening DOE over the past month. Feels as though her activity tolerance has declined and she is unable to walk as far as she used to. Also has trouble completing daily tasks around the house. She denies any cough, lower extremity swelling, wheezing, orthopnea, or PND. She has not been monitoring her oxygen levels at home but they did increase her home use to 5 lpm due to her shortness of breath. She continues on 4 lpm today with oxygen saturation 100%. She continues on Trelegy daily and has been using albuterol Twice daily, which seems to help her breathing. Denies any allergy symptoms.  ? ?Allergies  ?Allergen Reactions  ? Ace Inhibitors Swelling  ? Lisinopril Swelling  ? Penicillin G Hives, Shortness  Of Breath and Rash  ? Atorvastatin Other (See Comments)  ?  Myalgias (Muscle Pain) in high dosages  ?  ? Cortisone Other (See Comments)  ?  Unknown reaction  ? Hydrocortisone Other (See Comments)  ?  Unknown reaction  ? Meloxicam Other (See Comments)  ?  Bloody stools  ? Pravastatin Other (See Comments)  ?  Chest Pain if high dosage   ? ? ?Immunization History  ?Administered Date(s) Administered  ? Fluad Quad(high Dose 65+) 05/24/2019, 05/13/2020  ? Influenza, High Dose Seasonal PF 06/01/2021  ? Influenza-Unspecified 06/06/2018  ? PFIZER(Purple Top)SARS-COV-2 Vaccination 10/20/2019, 11/19/2019, 06/11/2020  ? Pension scheme manager 66yr & up 06/01/2021  ? Pneumococcal Conjugate-13 10/31/2013  ? Pneumococcal Polysaccharide-23 12/05/1997, 11/05/2007, 04/07/2015  ? Td 09/07/2003  ? Tdap 08/07/2015, 08/17/2017  ? Zoster Recombinat (Shingrix) 08/17/2017, 06/28/2019, 09/05/2019  ? Zoster, Live 11/05/2010  ? ? ?Past Medical History:  ?Diagnosis Date  ? CHF (congestive heart failure) ([facility]   ? COPD (chronic obstructive pulmonary disease) ([facility]   ? Diabetes ([facility]   ? type 2  ? Diverticulitis   ? Dyspnea   ? On Oxygen 3L via [place]   ? GERD (gastroesophageal reflux disease)   ? High cholesterol   ? Lung cancer ([facility]   ? Mild non proliferative diabetic retinopathy ([facility] [place] 08/05/2021  ? PE (pulmonary thromboembolism) ([facility] 02/29/2020  ? Pulmonary hypertension ([facility]   ? Uterine cancer ([facility]   ? ? ?Tobacco History: ?Social History  ? ?Tobacco Use  ?Smoking Status Former  ? Packs/day: 1.00  ?  Years: 58.00  ? Pack years: 58.00  ? Types: Cigarettes  ? Quit date: 10/22/2015  ? Years since quitting: 6.1  ? Passive exposure: Past  ?Smokeless Tobacco Never  ?Tobacco Comments  ? congratulated  ? ?Counseling given: Not Answered ?Tobacco comments: congratulated ? ? ?Outpatient Medications Prior to Visit  ?Medication Sig Dispense Refill  ? acetaminophen (TYLENOL) 500 MG tablet Take 1,000 mg by mouth every 6 (six) hours as needed  for mild pain or fever.    ? albuterol (VENTOLIN HFA) 108 (90 Base) MCG/ACT inhaler TAKE 2 PUFFS BY MOUTH EVERY 6 HOURS AS NEEDED FOR WHEEZE OR SHORTNESS OF BREATH 8.5 each 2  ? apixaban (ELIQUIS) 2.5 MG TABS tablet Take 1 tablet (2.5 mg total) by mouth 2 (two) times daily. 60 tablet 5  ? benzonatate (TESSALON PERLES) 100 MG capsule Take 1 capsule (100 mg total) by mouth 3 (three) times daily as needed for cough. 30 capsule 1  ? CALCIUM PO Take 1,200 mg by mouth daily.    ? Cinnamon 500 MG TABS Take 1,000 mg by mouth daily.     ? docusate sodium (COLACE) 100 MG capsule TAKE 1 CAPSULE BY MOUTH EVERY DAY 60 capsule 1  ? ferrous sulfate 325 (65 FE) MG EC tablet Take 1 tablet (325 mg total) by mouth 2 (two) times daily with a meal. 60 tablet 2  ? Fluticasone-Umeclidin-Vilant (TRELEGY ELLIPTA) 100-62.5-25 MCG/ACT AEPB INHALE 1 PUFF INTO THE LUNGS DAILY 60 each 2  ? furosemide (LASIX) 20 MG tablet TAKE 1 TABLET BY MOUTH EVERY DAY 90 tablet 2  ? JARDIANCE 10 MG TABS tablet TAKE 1 TABLET BY MOUTH DAILY BEFORE BREAKFAST. 30 tablet 3  ? losartan (COZAAR) 25 MG tablet Take 12.5 mg by mouth daily.    ? metFORMIN (GLUCOPHAGE) 1000 MG tablet Take 1 tablet (1,000 mg total) by mouth 2 (two) times daily with a meal. 180 tablet 1  ? Multiple Vitamins-Minerals (CENTRUM SILVER 50+WOMEN PO) Take 1 tablet by mouth daily.    ? Multiple Vitamins-Minerals (OCUVITE EYE HEALTH FORMULA PO) Take 1 capsule by mouth daily.    ? omeprazole (PRILOSEC) 40 MG capsule TAKE 1 CAPSULE BY MOUTH EVERY DAY 90 capsule 1  ? pravastatin (PRAVACHOL) 10 MG tablet TAKE 1 TABLET BY MOUTH EVERY DAY 90 tablet 1  ? pseudoephedrine-guaifenesin (MUCINEX D) 60-600 MG 12 hr tablet Take 1 tablet by mouth at bedtime.    ? vitamin C (ASCORBIC ACID) 500 MG tablet Take 500 mg by mouth every other day. Tues Thurs Sat and Sun    ? vitamin E 180 MG (400 UNITS) capsule Take 400 Units by mouth every Monday, Wednesday, and Friday.    ? ?No facility-administered medications prior  to visit.  ? ? ? ?Review of Systems:  ? ?Constitutional: No weight loss or gain, night sweats, fevers, chills, fatigue, or lassitude. ?HEENT: No headaches, difficulty swallowing, tooth/dental problems, or sore throat. No sneezing, itching, ear ache, nasal congestion, or post nasal drip ?CV:  No chest pain, orthopnea, PND, swelling in lower extremities, anasarca, dizziness, palpitations, syncope ?Resp: +shortness of breath with exertion. No excess mucus or change in color of mucus. No productive or non-productive. No hemoptysis. No wheezing.  No chest wall deformity ?GI:  No heartburn, indigestion, abdominal pain, nausea, vomiting, diarrhea, change in bowel habits, loss of appetite, bloody stools.  ?GU: No dysuria, change in color of urine, urgency or frequency.  No flank pain, no hematuria  ?Skin: No rash, lesions, ulcerations ?MSK:  No joint pain or swelling.  No decreased range of motion.  No back pain. ?Neuro: No dizziness or lightheadedness.  ?Psych: No depression or anxiety. Mood stable.  ? ? ? ?Physical Exam: ? ?BP 116/64 (BP Location: Right Arm)   Pulse 84   Ht 5' 2" (1.575 m)   Wt 133 lb 12.8 oz (60.7 kg)   SpO2 100%   BMI 24.47 kg/m?  ? ?GEN: Pleasant, interactive, elderly, frail; in no acute distress. ?HEENT:  Normocephalic and atraumatic. PERRLA. Sclera white. Nasal turbinates pink, moist and patent bilaterally. No rhinorrhea present. Oropharynx pink and moist, without exudate or edema. No lesions, ulcerations, or postnasal drip.  ?NECK:  Supple w/ fair ROM. No JVD present. Normal carotid impulses w/o bruits. Thyroid symmetrical with goiter. No lymphadenopathy.   ?CV: RRR, no m/r/g, no peripheral edema. Pulses intact, +2 bilaterally. No cyanosis, pallor or clubbing. ?PULMONARY:  Unlabored, regular breathing. Diminished bases bilaterally; otherwise clear A&P w/o wheezes/rales/rhonchi. No accessory muscle use. No dullness to percussion. ?GI: BS present and normoactive. Soft, non-tender to palpation. No  organomegaly or masses detected. No CVA tenderness. ?MSK: No erythema, warmth or tenderness. Cap refil <2 sec all extrem. No deformities or joint swelling noted.  ?Neuro: A/Ox3. No focal deficits noted.

## 2021-12-24 NOTE — Progress Notes (Signed)
Notified patient CXR without evidence of superimposed infection or acute process. There are stable changes in left midlung, likely scarring, and stable mild diffuse interstitial densities consistent with scarring.

## 2021-12-24 NOTE — Assessment & Plan Note (Signed)
Appears compensated on exam. Follow up with cardiology as scheduled.  ?

## 2021-12-24 NOTE — Telephone Encounter (Signed)
Albuterol Sulfate nebs., are covered under the patient medical benefits (Part B) DME supplier. ?

## 2021-12-24 NOTE — Assessment & Plan Note (Signed)
Will order CT chest for surveillance given significant smoking history, quit <15 years ago. Does not qualify for lung ca screening program d/t age.  ?

## 2021-12-24 NOTE — Assessment & Plan Note (Signed)
At baseline 4 lpm supplemental O2. Patient's daughter increased her to 5 lpm at home d/t DOE. We discussed titrating up only if O2 sats are 88% or below. Advise of increasing O2 requirements. Walking oximetry with desaturation to 87% on 4 lpm, recovered with rest and without increase in O2. Ok to increase to 5 lpm with activity and keep 4 lpm at rest. Goal >88-90% ?

## 2021-12-24 NOTE — Patient Instructions (Addendum)
Stop Trelegy. Trial Breztri 2 puffs Twice daily with spacer for two weeks. Brush tongue and rinse mouth afterwards. If you notice improvement in your breathing, please let me know and we will send a prescription. ?Continue Albuterol inhaler 2 puffs or 3 mL neb every 6 hours as needed for shortness of breath or wheezing. Notify if symptoms persist despite rescue inhaler/neb use. ?Continue supplemental oxygen 4 lpm for goal oxygen level >88-90%. Can increase to 5 lpm with activity  ? ?Prednisone taper. 4 tabs for 2 days, then 3 tabs for 2 days, 2 tabs for 2 days, then 1 tab for 2 days, then stop. Take in AM with food  ? ?Chest x ray today. We will notify you of any abnormal results.  ? ?Follow up in one month with Dr. [name] or Katie Monicka Cyran,NP. If symptoms do not improve or worsen, please contact office for sooner follow up or seek emergency care. ?

## 2021-12-24 NOTE — Assessment & Plan Note (Signed)
Recent cardiac cath in February with elevated pulmonary pressures. Followed by Dr. Gilman Schmidt with cardiology and on lasix 20 mg daily. Appears compensated on exam. Likely contributes to DOE.  ?

## 2021-12-31 ENCOUNTER — Other Ambulatory Visit: Payer: Self-pay | Admitting: Nurse Practitioner

## 2022-01-07 ENCOUNTER — Encounter: Payer: Self-pay | Admitting: Nurse Practitioner

## 2022-01-07 ENCOUNTER — Ambulatory Visit (INDEPENDENT_AMBULATORY_CARE_PROVIDER_SITE_OTHER): Payer: Medicare Other | Admitting: Nurse Practitioner

## 2022-01-07 VITALS — BP 128/70 | HR 82 | Temp 98.1°F | Ht 62.0 in | Wt 134.0 lb

## 2022-01-07 DIAGNOSIS — J449 Chronic obstructive pulmonary disease, unspecified: Secondary | ICD-10-CM

## 2022-01-07 DIAGNOSIS — I272 Pulmonary hypertension, unspecified: Secondary | ICD-10-CM | POA: Diagnosis not present

## 2022-01-07 DIAGNOSIS — E113293 Type 2 diabetes mellitus with mild nonproliferative diabetic retinopathy without macular edema, bilateral: Secondary | ICD-10-CM

## 2022-01-07 NOTE — Patient Instructions (Signed)

## 2022-01-07 NOTE — Progress Notes (Signed)
I,Brianna Adams,acting as a Education administrator for [employer], [name].,have documented all relevant documentation on the behalf of Brianna Brine, [name],as directed by  Brianna Brine, [name] while in the presence of Brianna Adams, Brianna Adams.  This visit occurred during the SARS-CoV-2 public health emergency.  Safety protocols were in place, including screening questions prior to the visit, additional usage of staff PPE, and extensive cleaning of exam room while observing appropriate contact time as indicated for disinfecting solutions.  Subjective:     Patient ID: Brianna Adams , female    DOB: 03-18-1941 , 81 y.o.   MRN: [id number]   Chief Complaint  Patient presents with   Diabetes    HPI  Pt presents today for DM f/u.   Diabetes She presents for her follow-up diabetic visit. She has type 2 diabetes mellitus. There are no hypoglycemic associated symptoms. Pertinent negatives for hypoglycemia include no dizziness or headaches. There are no diabetic associated symptoms. Pertinent negatives for diabetes include no chest pain, no fatigue, no polydipsia, no polyphagia and no polyuria. There are no hypoglycemic complications. There are no diabetic complications. Risk factors for coronary artery disease include sedentary lifestyle. Current diabetic treatment includes oral agent (monotherapy). She has not had a previous visit with a dietitian. (Does not check her blood sugars regularly ) Eye exam is not current.    Past Medical History:  Diagnosis Date   CHF (congestive heart failure) (HCC)    COPD (chronic obstructive pulmonary disease) (HCC)    Diabetes (HCC)    type 2   Diverticulitis    Dyspnea    On Oxygen 3L via [place]    GERD (gastroesophageal reflux disease)    High cholesterol    Lung cancer (HCC)    Mild non proliferative diabetic retinopathy ([place]) 08/05/2021   PE (pulmonary thromboembolism) ([place]) 02/29/2020   Pulmonary hypertension ([place])    Uterine cancer ([place])      Family History  Problem Relation Age  of Onset   Hypertension Mother    Heart attack Father    Cancer Maternal Aunt        unknown type   Stomach cancer Neg Hx    Colon cancer Neg Hx    Esophageal cancer Neg Hx    Pancreatic cancer Neg Hx      Current Outpatient Medications:    acetaminophen (TYLENOL) 500 MG tablet, Take 1,000 mg by mouth every 6 (six) hours as needed for mild pain or fever., Disp: , Rfl:    albuterol (PROVENTIL) (2.5 MG/3ML) 0.083% nebulizer solution, Take 3 mLs (2.5 mg total) by nebulization every 6 (six) hours as needed for wheezing or shortness of breath., Disp: 75 mL, Rfl: 12   albuterol (VENTOLIN HFA) 108 (90 Base) MCG/ACT inhaler, TAKE 2 PUFFS BY MOUTH EVERY 6 HOURS AS NEEDED FOR WHEEZE OR SHORTNESS OF BREATH, Disp: 8.5 each, Rfl: 2   apixaban (ELIQUIS) 2.5 MG TABS tablet, Take 1 tablet (2.5 mg total) by mouth 2 (two) times daily., Disp: 60 tablet, Rfl: 5   CALCIUM PO, Take 1,200 mg by mouth daily., Disp: , Rfl:    Cinnamon 500 MG TABS, Take 1,000 mg by mouth daily. , Disp: , Rfl:    furosemide (LASIX) 20 MG tablet, TAKE 1 TABLET BY MOUTH EVERY DAY, Disp: 90 tablet, Rfl: 2   JARDIANCE 10 MG TABS tablet, TAKE 1 TABLET BY MOUTH EVERY DAY BEFORE BREAKFAST, Disp: 30 tablet, Rfl: 3   Multiple Vitamins-Minerals (CENTRUM SILVER 50+WOMEN PO), Take 1 tablet by mouth daily.,  Disp: , Rfl:    omeprazole (PRILOSEC) 40 MG capsule, TAKE 1 CAPSULE BY MOUTH EVERY DAY, Disp: 90 capsule, Rfl: 1   pravastatin (PRAVACHOL) 10 MG tablet, TAKE 1 TABLET BY MOUTH EVERY DAY, Disp: 90 tablet, Rfl: 1   pseudoephedrine-guaifenesin (MUCINEX D) 60-600 MG 12 hr tablet, Take 1 tablet by mouth at bedtime., Disp: , Rfl:    Spacer/Aero-Holding Chambers DEVI, Use with inhaler, Disp: 1 each, Rfl: 2   vitamin C (ASCORBIC ACID) 500 MG tablet, Take 500 mg by mouth every other day. Tues Thurs Sat and Sun, Disp: , Rfl:    vitamin E 180 MG (400 UNITS) capsule, Take 400 Units by mouth every Monday, Wednesday, and Friday., Disp: , Rfl:    ferrous  sulfate 325 (65 FE) MG EC tablet, TAKE 1 TABLET BY MOUTH 2 TIMES DAILY WITH A MEAL., Disp: 90 tablet, Rfl: 1   losartan (COZAAR) 25 MG tablet, Take 0.5 tablets (12.5 mg total) by mouth daily., Disp: 30 tablet, Rfl: 1   metFORMIN (GLUCOPHAGE) 1000 MG tablet, TAKE 1 TABLET (1,000 MG TOTAL) BY MOUTH 2 (TWO) TIMES DAILY WITH A MEAL., Disp: 180 tablet, Rfl: 1   Allergies  Allergen Reactions   Ace Inhibitors Swelling   Lisinopril Swelling   Penicillin G Hives, Shortness Of Breath and Rash   Atorvastatin Other (See Comments)    Myalgias (Muscle Pain) in high dosages     Cortisone Other (See Comments)    Unknown reaction   Hydrocortisone Other (See Comments)    Unknown reaction   Meloxicam Other (See Comments)    Bloody stools   Pravastatin Other (See Comments)    Chest Pain if high dosage      Review of Systems  Constitutional: Negative.  Negative for fatigue.  Respiratory: Negative.    Cardiovascular: Negative.  Negative for chest pain.  Gastrointestinal: Negative.   Endocrine: Negative for polydipsia, polyphagia and polyuria.  Neurological: Negative.  Negative for dizziness and headaches.  Psychiatric/Behavioral: Negative.      Today's Vitals   01/07/22 1220  BP: 128/70  Pulse: 82  Temp: 98.1 F (36.7 C)  TempSrc: Oral  SpO2: 97%  Weight: 134 lb (60.8 kg)  Height: _0  (1.575 m)   Body mass index is 24.51 kg/m.  Wt Readings from Last 3 Encounters:  01/13/22 133 lb (60.3 kg)  01/07/22 134 lb (60.8 kg)  12/24/21 133 lb 12.8 oz (60.7 kg)    Objective:  Physical Exam Vitals reviewed.  Constitutional:      General: She is not in acute distress.    Appearance: Normal appearance.  Cardiovascular:     Pulses: Normal pulses.     Heart sounds: Normal heart sounds. No murmur heard. Pulmonary:     Effort: Pulmonary effort is normal. No respiratory distress.     Breath sounds: Normal breath sounds. No wheezing.     Comments: Wearing oxygen Neurological:     General: No  focal deficit present.     Mental Status: She is alert and oriented to person, place, and time.     Cranial Nerves: No cranial nerve deficit.     Motor: No weakness.  Psychiatric:        Mood and Affect: Mood normal.        Behavior: Behavior normal.        Thought Content: Thought content normal.        Judgment: Judgment normal.        Assessment And Plan:  1. Type 2 diabetes mellitus with both eyes affected by mild nonproliferative retinopathy without macular edema, without long-term current use of insulin (HCC) Comments: Slightly elevated at last visit, encouraged to eat a healthy diet. Diabetic foot exam done with slightly decreased sensation - Hemoglobin A1c  2. Pulmonary hypertension, unspecified ([place]) Comments: Blood pressure is well controlled. Continue current medication.  - BMP8+EGFR  3. Chronic obstructive pulmonary disease, unspecified COPD type ([place]) Comments: Stable     Patient was given opportunity to ask questions. Patient verbalized understanding of the plan and was able to repeat key elements of the plan. All questions were answered to their satisfaction.  Brianna Brine, [name]    I, Brianna Brine, [name], have reviewed all documentation for this visit. The documentation on 01/07/22 for the exam, diagnosis, procedures, and orders are all accurate and complete.  IF YOU HAVE BEEN REFERRED TO A SPECIALIST, IT MAY TAKE 1-2 WEEKS TO SCHEDULE/PROCESS THE REFERRAL. IF YOU HAVE NOT HEARD FROM US/SPECIALIST IN TWO WEEKS, PLEASE GIVE [place] A CALL AT [phone].   THE PATIENT IS ENCOURAGED TO PRACTICE SOCIAL DISTANCING DUE TO THE COVID-19 PANDEMIC.

## 2022-01-08 LAB — BMP8+EGFR
BUN/Creatinine Ratio: 19 (ref 12–28)
BUN: 18 mg/dL (ref 8–27)
CO2: 21 mmol/L (ref 20–29)
Calcium: 10.1 mg/dL (ref 8.7–10.3)
Chloride: 101 mmol/L (ref 96–106)
Creatinine, Ser: 0.93 mg/dL (ref 0.57–1.00)
Glucose: 118 mg/dL — ABNORMAL HIGH (ref 70–99)
Potassium: 5.1 mmol/L (ref 3.5–5.2)
Sodium: 139 mmol/L (ref 134–144)
eGFR: 61 mL/min/{1.73_m2} (ref >59)

## 2022-01-08 LAB — HEMOGLOBIN A1C
Est. average glucose Bld gHb Est-mCnc: 146 mg/dL
Hgb A1c MFr Bld: 6.7 % — ABNORMAL HIGH (ref 4.8–5.6)

## 2022-01-11 ENCOUNTER — Other Ambulatory Visit (HOSPITAL_COMMUNITY): Payer: Self-pay | Admitting: Nurse Practitioner

## 2022-01-11 NOTE — Progress Notes (Signed)
Cardiology Office Note:    Date:  01/13/2022   ID:  Brianna Adams, DOB [birth year], MRN [id number]  PCP:  Arnette Felts, [name]  Cardiologist:  None  Electrophysiologist:  None   Referring MD: Arnette Felts, [name]   No chief complaint on file.   History of Present Illness:    Brianna Adams is a 81 y.o. female with a hx of chronic diastolic heart failure, COPD, PE on Eliquis, T2DM who presents for follow-up.  She was referred by Arnette Felts, [name] for evaluation of heart failure, initially seen on 01/08/2021.  She was admitted from 6/5 through 12/10/2020.  She presented with worsening shortness of breath.  Had to increase her home oxygen from 3 to 4 L.  Reports she had not been taking her Lasix regularly.  Chest x-ray showed pulmonary edema, BNP 24.  Echocardiogram 12/10/2020 showed LVEF 55 to 60%, grade 1 diastolic dysfunction, moderate RV enlargement, severe pulmonary hypertension (RVSP 85 mmHg).  She received IV diuresis with improvement in symptoms and was discharged on Lasix 20 mg daily.  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  Follows with pulmonology.  Echocardiogram 10/08/2021 showed EF 60 to 65%, interventricular septal flattening in systole and diastole consistent with RV pressure and volume overload, moderate RV systolic dysfunction, moderate RV enlargement, severely elevated pulmonary pressures (RVSP 90), mild to moderate TR, RAP 3.  RHC on 10/20/2021 showed RA 2, RV 76/0, PA 75/21/42, PCWP 24, PA sat 67%, Fick CI 3.4, thermodilution CI 2.4.  Since last clinic visit, reports has been doing okay.  Dyspnea has improved since recent inhaler change. Denies any chest pain, lightheadedness, syncope, lower extremity edema, or palpitations.   she reports she has been doing OK.  Does report has been having dyspnea.  Denies any chest pain, lightheadedness, syncope, lower extremity edema.  Reports has been having palpitations about 1-2 times per month, will last for 15 to 20 minutes, feels  like heart is racing.   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   PE (pulmonary thromboembolism) (HCC) 02/29/2020   Pulmonary hypertension (HCC)    Uterine cancer [facility])     Past Surgical History:  Procedure Laterality Date   ABDOMINAL HYSTERECTOMY     cataract surgery  2012 and 2017   COLONOSCOPY     FIBEROPTIC BRONCHOSCOPY     FLEXIBLE SIGMOIDOSCOPY N/A 08/13/2021   Procedure: FLEXIBLE SIGMOIDOSCOPY;  Surgeon: Imogene Burn, MD;  Location: [facility];  Service: Gastroenterology;  Laterality: N/A;   GANGLION CYST EXCISION Left 09/11/2020   Procedure: EXCISION VOLAR RADIAL GANGLION OF LEFT WRIST;  Surgeon: Cindee Salt, MD;  Location: [facility] OR;  Service: Orthopedics;  Laterality: Left;  AXILLARY BLOCK   resection of lung cancer  2018   RIGHT HEART CATH N/A 10/20/2021   Procedure: RIGHT HEART CATH;  Surgeon: Marykay Lex, MD;  Location: [facility] CV [facility];  Service: Cardiovascular;  Laterality: N/A;  right lower lobe non-anatomocal lung resection wedge     right thorascoscopy      Current Medications: Current Meds  Medication Sig   acetaminophen (TYLENOL) 500 MG tablet Take 1,000 mg by mouth every 6 (six) hours as needed for mild pain or fever.   albuterol (PROVENTIL) (2.5 MG/3ML) 0.083% nebulizer solution Take 3 mLs (2.5 mg total) by nebulization every 6 (six) hours as needed for wheezing or shortness of breath.   albuterol (VENTOLIN HFA) 108 (90 Base) MCG/ACT inhaler TAKE 2 PUFFS BY MOUTH EVERY 6 HOURS AS NEEDED FOR WHEEZE OR  SHORTNESS OF BREATH   apixaban (ELIQUIS) 2.5 MG TABS tablet Take 1 tablet (2.5 mg total) by mouth 2 (two) times daily.   CALCIUM PO Take 1,200 mg by mouth daily.   Cinnamon 500 MG TABS Take 1,000 mg by mouth daily.    ferrous sulfate 325 (65 FE) MG EC tablet Take 1 tablet (325 mg total) by mouth 2 (two) times daily with a meal.   furosemide (LASIX) 20 MG tablet TAKE 1 TABLET BY MOUTH EVERY DAY   JARDIANCE 10 MG TABS tablet TAKE 1 TABLET BY MOUTH EVERY DAY BEFORE BREAKFAST   losartan (COZAAR) 25 MG tablet Take 0.5 tablets (12.5 mg total) by mouth daily.   metFORMIN (GLUCOPHAGE) 1000 MG tablet Take 1 tablet (1,000 mg total) by mouth 2 (two) times daily with a meal.   Multiple Vitamins-Minerals (CENTRUM SILVER 50+WOMEN PO) Take 1 tablet by mouth daily.   omeprazole (PRILOSEC) 40 MG capsule TAKE 1 CAPSULE BY MOUTH EVERY DAY   pravastatin (PRAVACHOL) 10 MG tablet TAKE 1 TABLET BY MOUTH EVERY DAY   pseudoephedrine-guaifenesin (MUCINEX D) 60-600 MG 12 hr tablet Take 1 tablet by mouth at bedtime.   Spacer/Aero-Holding Rudean Curt Use with inhaler   vitamin C (ASCORBIC ACID) 500 MG tablet Take 500 mg by mouth every other day. Tues Thurs Sat and Sun   vitamin E 180 MG (400 UNITS) capsule Take 400 Units by mouth every Monday, Wednesday, and Friday.   [DISCONTINUED] benzonatate (TESSALON PERLES) 100 MG capsule Take 1 capsule (100 mg total) by mouth 3 (three) times daily as needed for cough.   [DISCONTINUED] docusate sodium (COLACE) 100 MG capsule TAKE 1 CAPSULE BY MOUTH EVERY DAY   [DISCONTINUED] Fluticasone-Umeclidin-Vilant (TRELEGY ELLIPTA) 100-62.5-25 MCG/ACT AEPB INHALE 1 PUFF INTO THE LUNGS DAILY   [DISCONTINUED] Multiple Vitamins-Minerals (OCUVITE EYE HEALTH FORMULA PO) Take 1 capsule by mouth daily.     Allergies:   Ace inhibitors, Lisinopril, Penicillin g, Atorvastatin, Cortisone, Hydrocortisone, Meloxicam, and Pravastatin   Social History   Socioeconomic History   Marital status: Widowed     Spouse name: Not on file   Number of children: 1   Years of education: Not on file   Highest education level: Master's degree (e.g., MA, MS, MEng, MEd, MSW, MBA)  Occupational History   Occupation: retired    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r-sr Runner, broadcasting/film/video  Tobacco Use   Smoking status: Former    Packs/day: 1.00    Years: 58.00    Pack years: 58.00    Types: Cigarettes    Quit date: 10/22/2015    Years since quitting: 6.2    Passive exposure: Past   Smokeless tobacco: Never   Tobacco comments:    congratulated  Building services engineer Use: Never used  Substance and Sexual Activity   Alcohol use: Never   Drug use: Never   Sexual activity: Not Currently    Birth control/protection: Surgical  Comment: HYSTERECTOMY  Other Topics Concern   Not on file  Social History Narrative   Not on file   Social Determinants of Health   Financial Resource Strain: Low Risk    Difficulty of Paying Living Expenses: Not hard at all  Food Insecurity: No Food Insecurity   Worried About Programme researcher, broadcasting/film/video in the Last Year: Never true   Ran Out of Food in the Last Year: Never true  Transportation Needs: No Transportation Needs   Lack of Transportation (Medical): No   Lack of Transportation (Non-Medical): No  Physical Activity: Insufficiently Active   Days of Exercise per Week: 3 days   Minutes of Exercise per Session: 40 min  Stress: No Stress Concern Present   Feeling of Stress : Not at all  Social Connections: Not on file     Family History: The patient's family history includes Cancer in her maternal aunt; Heart attack in her father; Hypertension in her mother. There is no history of Stomach cancer, Colon cancer, Esophageal cancer, or Pancreatic cancer.  ROS:   Please see the history of present illness.    All other systems reviewed and are negative.  EKGs/Labs/Other Studies Reviewed:    The following studies were reviewed today:   EKG:   01/08/21: Sinus rhythm. Rate 75 bpm. No ST abnormalities,  Left atrial enlargement  Recent Labs: 03/09/2021: B Natriuretic Peptide 341.7 05/24/2021: ALT 12 10/06/2021: Magnesium 2.1 10/22/2021: Hemoglobin 12.5; Platelets 271 01/07/2022: BUN 18; Creatinine, Ser 0.93; Potassium 5.1; Sodium 139  Recent Lipid Panel    Component Value Date/Time   CHOL 151 03/10/2021 0620   CHOL 189 03/03/2021 1150   TRIG 80 03/10/2021 0620   HDL 51 03/10/2021 0620   HDL 74 03/03/2021 1150   CHOLHDL 3.0 03/10/2021 0620   VLDL 16 03/10/2021 0620   LDLCALC 84 03/10/2021 0620   LDLCALC 102 (H) 03/03/2021 1150    Physical Exam:    VS:  BP 118/66   Pulse 80   Ht 5\' 2"  (1.575 m)   Wt 133 lb (60.3 kg)   BMI 24.33 kg/m     Wt Readings from Last 3 Encounters:  01/13/22 133 lb (60.3 kg)  01/07/22 134 lb (60.8 kg)  12/24/21 133 lb 12.8 oz (60.7 kg)     GEN: Well nourished, well developed in no acute distress HEENT: Normal NECK: No JVD; No carotid bruits CARDIAC: RRR, no murmurs, rubs, gallops RESPIRATORY:  Clear to auscultation without rales, wheezing or rhonchi  ABDOMEN: Soft, non-tender, non-distended MUSCULOSKELETAL:  No edema; No deformity  SKIN: Warm and dry NEUROLOGIC:  Alert and oriented x 3 PSYCHIATRIC:  Normal affect   ASSESSMENT:    1. Severe pulmonary hypertension (HCC)   2. Chronic diastolic heart failure (HCC)   3. Palpitations   4. Hyperlipidemia, unspecified hyperlipidemia type      PLAN:     Severe pulmonary hypertension: RVSP 85 mmHg on echo 12/2020.  Likely combination of group 2 and group 3 PH.  Also could potentially be group 4 given history of PE.  Follows with pulmonology for her COPD.  Echocardiogram 10/08/2021 showed EF 60 to 65%, interventricular septal flattening in systole and diastole consistent with RV pressure and volume overload, moderate RV systolic dysfunction, moderate RV enlargement, severely elevated pulmonary pressures (RVSP 90), mild to moderate TR, RAP 3.  RHC on 10/20/2021 showed RA 2, RV 76/0, PA 75/21/42, PCWP 24, PA  sat 67%, Fick CI 3.4, thermodilution CI 2.4. -Advanced heart failure referral  Chronic diastolic heart failure: EF 12/2020 with normal EF, moderately enlarged RV, normal RV function, severe pulmonary hypertension -Continue Lasix 20 mg daily.   -Continue Jardiance 10 mg daily  Palpitations: Zio patch ordered previously but she did not wear.  Reports palpitations have resolved.  We will continue to monitor  Hyperlipidemia: On pravastatin 10 mg daily.  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  On 2.5 mg dose Eliquis due to anemia and issues with rectal bleeding  RTC in 3 months   Medication Adjustments/Labs and Tests Ordered: Current medicines are reviewed at length with the patient today.  Concerns regarding medicines are outlined above.  Orders Placed This Encounter  Procedures   AMB referral to CHF clinic   No orders of the defined types were placed in this encounter.   Patient Instructions  Medication Instructions:  The current medical regimen is effective;  continue present plan and medications.  *If you need a refill on your cardiac medications before your next appointment, please call your pharmacy*   Follow-Up: At [facility], you and your health needs are our priority.  As part of our continuing mission to provide you with exceptional heart care, we have created designated Provider Care Teams.  These Care Teams include your primary Cardiologist (physician) and Advanced Practice Providers (APPs -  Physician Assistants and Nurse Practitioners) who all work together to provide you with the care you need, when you need it.  We recommend signing up for the patient portal called "MyChart".  Sign up information is provided on this After Visit Summary.  MyChart is used to connect with patients for Virtual Visits (Telemedicine).  Patients are able to view lab/test results, encounter notes, upcoming appointments, etc.  Non-urgent messages can be sent to your provider as  well.   To learn more about what you can do with MyChart, go to [email or web address].    Your next appointment:   3 month(s)  The format for your next appointment:   In Person  Provider:   Epifanio Lesches, MD     Referral to Advanced Heart Failure- they will call you for an appointment.       Signed, Little Ishikawa, MD  01/13/2022 9:01 AM    [facility]

## 2022-01-12 ENCOUNTER — Other Ambulatory Visit: Payer: Self-pay

## 2022-01-12 MED ORDER — LOSARTAN POTASSIUM 25 MG PO TABS: 12.5000 mg | ORAL_TABLET | Freq: Every day | ORAL | 1 refills | Status: DC

## 2022-01-13 ENCOUNTER — Ambulatory Visit (INDEPENDENT_AMBULATORY_CARE_PROVIDER_SITE_OTHER): Payer: Medicare Other | Admitting: Cardiology

## 2022-01-13 ENCOUNTER — Encounter: Payer: Self-pay | Admitting: Cardiology

## 2022-01-13 VITALS — BP 118/66 | HR 80 | Ht 62.0 in | Wt 133.0 lb

## 2022-01-13 DIAGNOSIS — I272 Pulmonary hypertension, unspecified: Secondary | ICD-10-CM | POA: Diagnosis not present

## 2022-01-13 DIAGNOSIS — I5032 Chronic diastolic (congestive) heart failure: Secondary | ICD-10-CM | POA: Diagnosis not present

## 2022-01-13 DIAGNOSIS — E785 Hyperlipidemia, unspecified: Secondary | ICD-10-CM

## 2022-01-13 DIAGNOSIS — R002 Palpitations: Secondary | ICD-10-CM | POA: Diagnosis not present

## 2022-01-13 NOTE — Patient Instructions (Signed)
Medication Instructions:  ?The current medical regimen is effective;  continue present plan and medications. ? ?*If you need a refill on your cardiac medications before your next appointment, please call your pharmacy* ? ? ?Follow-Up: ?At [facility], you and your health needs are our priority.  As part of our continuing mission to provide you with exceptional heart care, we have created designated Provider Care Teams.  These Care Teams include your primary Cardiologist (physician) and Advanced Practice Providers (APPs -  Physician Assistants and Nurse Practitioners) who all work together to provide you with the care you need, when you need it. ? ?We recommend signing up for the patient portal called "MyChart".  Sign up information is provided on this After Visit Summary.  MyChart is used to connect with patients for Virtual Visits (Telemedicine).  Patients are able to view lab/test results, encounter notes, upcoming appointments, etc.  Non-urgent messages can be sent to your provider as well.   ?To learn more about what you can do with MyChart, go to [email or web address].   ? ?Your next appointment:   ?3 month(s) ? ?The format for your next appointment:   ?In Person ? ?Provider:   ?Oswaldo Milian, MD  ? ? ? ?Referral to Advanced Heart Failure- they will call you for an appointment. ? ? ? ?

## 2022-01-20 ENCOUNTER — Ambulatory Visit
Admission: RE | Admit: 2022-01-20 | Discharge: 2022-01-20 | Disposition: A | Discharge status: Home / Self Care | Payer: Medicare Other | Source: Ambulatory Visit | Attending: Nurse Practitioner | Admitting: Nurse Practitioner

## 2022-01-20 DIAGNOSIS — Z87891 Personal history of nicotine dependence: Secondary | ICD-10-CM

## 2022-01-20 DIAGNOSIS — C3490 Malignant neoplasm of unspecified part of unspecified bronchus or lung: Secondary | ICD-10-CM

## 2022-01-22 ENCOUNTER — Other Ambulatory Visit: Payer: Self-pay | Admitting: Oncology

## 2022-01-26 ENCOUNTER — Ambulatory Visit: Payer: Medicare Other | Admitting: Nurse Practitioner

## 2022-01-31 ENCOUNTER — Other Ambulatory Visit: Payer: Self-pay | Admitting: Nurse Practitioner

## 2022-02-02 NOTE — Progress Notes (Signed)
Please notify patient CT chest was stable. He has advanced emphysema, which he is on the appropriate medications for. Thanks.

## 2022-02-15 ENCOUNTER — Telehealth: Payer: Self-pay | Admitting: Oncology

## 2022-02-15 NOTE — Telephone Encounter (Signed)
spoke with pt's daughter. Informed her of r/s appt due to provider being out on PAL, she will call back to confimr if appts will work.Marland KitchenKJ

## 2022-02-16 ENCOUNTER — Encounter: Payer: Self-pay | Admitting: Nurse Practitioner

## 2022-02-16 ENCOUNTER — Ambulatory Visit (INDEPENDENT_AMBULATORY_CARE_PROVIDER_SITE_OTHER): Payer: Medicare Other | Admitting: Nurse Practitioner

## 2022-02-16 VITALS — BP 108/64 | HR 73 | Ht 62.0 in | Wt 131.8 lb

## 2022-02-16 DIAGNOSIS — J9611 Chronic respiratory failure with hypoxia: Secondary | ICD-10-CM | POA: Diagnosis not present

## 2022-02-16 DIAGNOSIS — J4489 Other specified chronic obstructive pulmonary disease: Secondary | ICD-10-CM

## 2022-02-16 DIAGNOSIS — C3490 Malignant neoplasm of unspecified part of unspecified bronchus or lung: Secondary | ICD-10-CM

## 2022-02-16 DIAGNOSIS — J449 Chronic obstructive pulmonary disease, unspecified: Secondary | ICD-10-CM | POA: Diagnosis not present

## 2022-02-16 DIAGNOSIS — I272 Pulmonary hypertension, unspecified: Secondary | ICD-10-CM | POA: Diagnosis not present

## 2022-02-16 DIAGNOSIS — I5032 Chronic diastolic (congestive) heart failure: Secondary | ICD-10-CM

## 2022-02-16 DIAGNOSIS — I2609 Other pulmonary embolism with acute cor pulmonale: Secondary | ICD-10-CM

## 2022-02-16 MED ORDER — BREZTRI AEROSPHERE 160-9-4.8 MCG/ACT IN AERO: 2.0000 | INHALATION_SPRAY | Freq: Two times a day (BID) | RESPIRATORY_TRACT | 0 refills | Status: DC

## 2022-02-16 MED ORDER — BREZTRI AEROSPHERE 160-9-4.8 MCG/ACT IN AERO: 2.0000 | INHALATION_SPRAY | Freq: Two times a day (BID) | RESPIRATORY_TRACT | 12 refills | Status: DC

## 2022-02-16 NOTE — Assessment & Plan Note (Addendum)
Severe pulmonary hypertension.  Referred by cardiology to the advanced heart failure clinic back in May.  She has yet to hear from them to schedule an appointment.  Recommended that she contact Dr. Newman Nickels office to follow-up on this.

## 2022-02-16 NOTE — Assessment & Plan Note (Signed)
History of pulmonary embolism on lifelong anticoagulation with Eliquis.  No excessive bleeding/bruising.

## 2022-02-16 NOTE — Assessment & Plan Note (Signed)
No evidence of recurrence on recent CT.  She has follow-up scheduled with oncology in August.

## 2022-02-16 NOTE — Progress Notes (Signed)
_0  ID: Brianna Adams, female    DOB: 08/18/1941, 81 y.o.   MRN: [id number]  Chief Complaint  Patient presents with   Follow-up    Pt states she has been doing okay since last visit and denies any real complaints.    Referring provider: [name], [name]  HPI: 81 year old female, former smoker (33 pack years) followed for COPD with chronic bronchitis and emphysema, chronic respiratory failure. She is a patient of Dr. [name] and last seen in office on 12/24/2021. She has a past history of lung cancer s/p RLL resection May 2018, and 2019 radiation to new upper lobe nodule. Past medical history significant for PE on Eliquis, HTN, pulmonary HTN, chronic diastolic CHF, GERD, DM II, CKD stage II, HLD, IDA, multinodular goiter.   TEST/EVENTS:  01/16/2021 CT chest wo contrast: overall the CT chest is stable without evidence of local recurrence or metastatic disease. There is stable radiation fibrosis in the lingula with adjacent chronic fx of the left 5th rib. Stable postsurgical changes in the RLL. There are stable borderline enlarged mediastinal and hilar lymph nodes. Atherosclerosis. Heterogenous enlargement of the thyroid gland, consistent with multinodular goiter. Thyroid US advised.   01/20/2022 CT chest without contrast: The right and left pulmonary arteries are enlarged.  There is prominent mediastinal and hilar lymph nodes which are stable when compared to prior CT.  There is a stable left upper lobe, lingula irregular discoid opacity, consistent with postradiation scarring.  There are postsurgical changes in the right lower lobe with adjacent nodular/discoid scarring which is also stable.  There is advanced emphysema present.  There is an unchanged left lateral rib fracture.  12/24/2021: OV with Zaria Taha NP for worsening DOE over the past month. Feels as though her activity tolerance has declined and she is unable to walk as far as she used to. Also has trouble completing daily tasks around the  house. She has not been monitoring her oxygen levels at home but they did increase her home use to 5 lpm due to her shortness of breath. She continues on 4 lpm today with oxygen saturation 100%. AECOPD vs progression -  treated with taper. Advised trial change from Trelegy to [facility].   02/16/2022: Today - follow up Patient presents today for follow up with her daughter. She did not try switching to the [place] until sometime in May, due to her daughter being out of town, but she has felt a noticeable difference when compared to the Trelegy. Reports feeling better after our last visit with improved activity tolerance. She still does have some DOE at baseline and gets winded with longer distances and climbing. She denies recent cough, wheeze, hemoptysis, weight loss, anorexia.  She is on 4 L/min continuous supplemental O2, which is her baseline.  She is followed by cardiology who referred her to the [facility] clinic for her [place]. They report that they have not heard anything to get this scheduled.   Allergies  Allergen Reactions   Ace Inhibitors Swelling   Lisinopril Swelling   Penicillin G Hives, Shortness Of Breath and Rash   Atorvastatin Other (See Comments)    Myalgias (Muscle Pain) in high dosages     Cortisone Other (See Comments)    Unknown reaction   Hydrocortisone Other (See Comments)    Unknown reaction   Meloxicam Other (See Comments)    Bloody stools   Pravastatin Other (See Comments)    Chest Pain if high dosage     Immunization History  Administered Date(s) Administered   Fluad Quad(high Dose 65+) 05/24/2019, 05/13/2020   Influenza, High Dose Seasonal PF 06/01/2021   Influenza-Unspecified 06/06/2018   PFIZER(Purple Top)SARS-COV-2 Vaccination 10/20/2019, 11/19/2019, 06/11/2020   Pfizer Covid-19 Vaccine Bivalent Booster 18yr & up 06/01/2021   Pneumococcal Conjugate-13 10/31/2013   Pneumococcal Polysaccharide-23 12/05/1997, 11/05/2007, 04/07/2015   Td 09/07/2003   Tdap  08/07/2015, 08/17/2017   Zoster Recombinat (Shingrix) 08/17/2017, 06/28/2019, 09/05/2019   Zoster, Live 11/05/2010    Past Medical History:  Diagnosis Date   CHF (congestive heart failure) (HCC)    COPD (chronic obstructive pulmonary disease) (HCC)    Diabetes ([place]    type 2   Diverticulitis    Dyspnea    On Oxygen 3L via [place]    GERD (gastroesophageal reflux disease)    High cholesterol    Lung cancer (HCC)    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   PE (pulmonary thromboembolism) ([place] [place] 02/29/2020   Pulmonary hypertension ([place]    Uterine cancer ([facility]     Tobacco History: Social History   Tobacco Use  Smoking Status Former   Packs/day: 1.00   Years: 58.00   Total pack years: 58.00   Types: Cigarettes   Quit date: 10/22/2015   Years since quitting: 6.3   Passive exposure: Past  Smokeless Tobacco Never  Tobacco Comments   congratulated   Counseling given: Not Answered Tobacco comments: congratulated   Outpatient Medications Prior to Visit  Medication Sig Dispense Refill   acetaminophen (TYLENOL) 500 MG tablet Take 1,000 mg by mouth every 6 (six) hours as needed for mild pain or fever.     albuterol (PROVENTIL) (2.5 MG/3ML) 0.083% nebulizer solution Take 3 mLs (2.5 mg total) by nebulization every 6 (six) hours as needed for wheezing or shortness of breath. 75 mL 12   albuterol (VENTOLIN HFA) 108 (90 Base) MCG/ACT inhaler TAKE 2 PUFFS BY MOUTH EVERY 6 HOURS AS NEEDED FOR WHEEZE OR SHORTNESS OF BREATH 8.5 each 2   apixaban (ELIQUIS) 2.5 MG TABS tablet Take 1 tablet (2.5 mg total) by mouth 2 (two) times daily. 60 tablet 5   CALCIUM PO Take 1,200 mg by mouth daily.     Cinnamon 500 MG TABS Take 1,000 mg by mouth daily.      ferrous sulfate 325 (65 FE) MG EC tablet TAKE 1 TABLET BY MOUTH 2 TIMES DAILY WITH A MEAL. 90 tablet 1   furosemide (LASIX) 20 MG tablet TAKE 1 TABLET BY MOUTH EVERY DAY 90 tablet 2   JARDIANCE 10 MG TABS tablet TAKE 1 TABLET BY  MOUTH EVERY DAY BEFORE BREAKFAST 30 tablet 3   losartan (COZAAR) 25 MG tablet Take 0.5 tablets (12.5 mg total) by mouth daily. 30 tablet 1   metFORMIN (GLUCOPHAGE) 1000 MG tablet TAKE 1 TABLET (1,000 MG TOTAL) BY MOUTH 2 (TWO) TIMES DAILY WITH A MEAL. 180 tablet 1   Multiple Vitamins-Minerals (CENTRUM SILVER 50+WOMEN PO) Take 1 tablet by mouth daily.     omeprazole (PRILOSEC) 40 MG capsule TAKE 1 CAPSULE BY MOUTH EVERY DAY 90 capsule 1   pravastatin (PRAVACHOL) 10 MG tablet TAKE 1 TABLET BY MOUTH EVERY DAY 90 tablet 1   pseudoephedrine-guaifenesin (MUCINEX D) 60-600 MG 12 hr tablet Take 1 tablet by mouth at bedtime.     Spacer/Aero-Holding CDorise BullionUse with inhaler 1 each 2   vitamin C (ASCORBIC ACID) 500 MG tablet Take 500 mg by mouth every other day. Tues Thurs Sat and Sun  vitamin E 180 MG (400 UNITS) capsule Take 400 Units by mouth every Monday, Wednesday, and Friday.     No facility-administered medications prior to visit.     Review of Systems:   Constitutional: No weight loss or gain, night sweats, fevers, chills, fatigue, or lassitude. HEENT: No headaches, difficulty swallowing, tooth/dental problems, or sore throat. No sneezing, itching, ear ache, nasal congestion, or post nasal drip CV:  No chest pain, orthopnea, PND, swelling in lower extremities, anasarca, dizziness, palpitations, syncope Resp: +shortness of breath with exertion (baseline). No excess mucus or change in color of mucus. No productive or non-productive. No hemoptysis. No wheezing.  No chest wall deformity GI:  No heartburn, indigestion, abdominal pain, nausea, vomiting, diarrhea, change in bowel habits, loss of appetite, bloody stools.  Skin: No rash, lesions, ulcerations MSK:  No joint pain or swelling.  No decreased range of motion.  No back pain. Neuro: No dizziness or lightheadedness.  Psych: No depression or anxiety. Mood stable.     Physical Exam:  BP 108/64 (BP Location: Right Arm, Patient  Position: Sitting, Cuff Size: Normal)   Pulse 73   Ht 5' 2" (1.575 m)   Wt 131 lb 12.8 oz (59.8 kg)   SpO2 97% Comment: 4L cont  BMI 24.11 kg/m   GEN: Pleasant, interactive, elderly, frail; in no acute distress. HEENT:  Normocephalic and atraumatic. PERRLA. Sclera white. Nasal turbinates pink, moist and patent bilaterally. No rhinorrhea present. Oropharynx pink and moist, without exudate or edema. No lesions, ulcerations, or postnasal drip.  NECK:  Supple w/ fair ROM. No JVD present. Normal carotid impulses w/o bruits. Thyroid symmetrical with goiter. No lymphadenopathy.   CV: RRR, no m/r/g, no peripheral edema. Pulses intact, +2 bilaterally. No cyanosis, pallor or clubbing. PULMONARY:  Unlabored, regular breathing. Diminished bases bilaterally; otherwise clear A&P w/o wheezes/rales/rhonchi. No accessory muscle use. No dullness to percussion. GI: BS present and normoactive. Soft, non-tender to palpation. No organomegaly or masses detected. No CVA tenderness. MSK: No erythema, warmth or tenderness. Cap refil <2 sec all extrem. No deformities or joint swelling noted.  Neuro: A/Ox3. No focal deficits noted.   Skin: Warm, no lesions or rashe Psych: Normal affect and behavior. Judgement and thought content appropriate.     Lab Results:  CBC    Component Value Date/Time   WBC 6.0 10/22/2021 1323   RBC 4.51 10/22/2021 1323   HGB 12.5 10/22/2021 1323   HGB 9.9 (L) 06/04/2021 1137   HCT 39.6 10/22/2021 1323   HCT 31.2 (L) 06/04/2021 1137   PLT 271 10/22/2021 1323   PLT 313 06/04/2021 1137   MCV 87.8 10/22/2021 1323   MCV 86 06/04/2021 1137   MCH 27.7 10/22/2021 1323   MCHC 31.6 10/22/2021 1323   RDW 19.0 (H) 10/22/2021 1323   RDW 15.8 (H) 06/04/2021 1137   LYMPHSABS 1.5 06/24/2021 1040   MONOABS 0.4 06/24/2021 1040   EOSABS 0.1 06/24/2021 1040   BASOSABS 0.1 06/24/2021 1040    BMET    Component Value Date/Time   NA 139 01/07/2022 1255   K 5.1 01/07/2022 1255   CL 101  01/07/2022 1255   CO2 21 01/07/2022 1255   GLUCOSE 118 (H) 01/07/2022 1255   GLUCOSE 130 (H) 10/20/2021 1231   BUN 18 01/07/2022 1255   CREATININE 0.93 01/07/2022 1255   CALCIUM 10.1 01/07/2022 1255   GFRNONAA >60 10/20/2021 1231   GFRAA 81 05/13/2020 1043    BNP    Component Value Date/Time  BNP 341.7 (H) 03/09/2021 1137     Imaging:  CT CHEST WO CONTRAST  Result Date: 01/21/2022 CLINICAL DATA:  Hx of lung cancer s/p RLL resection and SBRT upper lobe nodule Hx Squamous cell carcinoma and uterineHx of COPD, DM Pt on Home O2Former smoker EXAM: CT CHEST WITHOUT CONTRAST TECHNIQUE: Multidetector CT imaging of the chest was performed following the standard protocol without IV contrast. RADIATION DOSE REDUCTION: This exam was performed according to the departmental dose-optimization program which includes automated exposure control, adjustment of the mA and/or kV according to patient size and/or use of iterative reconstruction technique. COMPARISON:  Chest radiographs, most recent dated 12/24/2021. Chest CTs, 01/16/2021, 06/25/2020, 02/29/2020 and 09/27/2019. FINDINGS: Cardiovascular: Heart normal in size. Left and circumflex coronary artery calcifications. No pericardial effusion. Minor aortic atherosclerosis. Enlarged right and left pulmonary arteries, right 2.7 cm, left 2.8 cm. Mediastinum/Nodes: Prominent mediastinal and hilar lymph nodes, stable from the prior CT. No mediastinal or hilar mass. Stable appearance of the thyroid, assessed with thyroid ultrasound on 03/27/2021. Lungs/Pleura: Stable left upper lobe, lingula irregular discoid opacity, consistent with post radiation scarring, adjacent to a chronic appearing, probable pathologic, left lateral fifth rib fracture. Postsurgical changes in the right lower lobe with adjacent nodular/discoid scarring, stable. Advanced emphysema. No evidence of pneumonia or pulmonary edema. No pleural effusion or pneumothorax. Upper Abdomen: No acute  findings. Nodular thickening of the left adrenal gland consistent with hyperplasia, stable. Musculoskeletal: Left lateral fifth rib fracture, with adjacent callus formation, unchanged. No other fracture. No bone lesion. IMPRESSION: 1. Stable appearance of the chest CT, without evidence of local carcinoma recurrence or active metastatic disease. 2. Discoid type opacity in the left upper lobe lingula is consistent with radiation induced scarring, unchanged. Stable postsurgical changes in the right lower lobe. 3. Borderline mediastinal hilar lymph nodes also stable. 4. Advanced emphysema. Aortic Atherosclerosis (ICD10-I70.0) and Emphysema (ICD10-J43.9). Electronically Signed   By: Lajean Manes M.D.   On: 01/21/2022 12:48          No data to display          No results found for: "NITRICOXIDE"      Assessment & Plan:   COPD with chronic bronchitis and emphysema ([place]) Improvement with change to [facility].  Appears compensated on current regimen with resolution of previous exacerbation.  Sent Rx for [employer] today.  Continue albuterol as needed.  Patient Instructions  Continue Breztri 2 puffs Twice daily with spacer. Brush tongue and rinse mouth afterwards. Continue Albuterol inhaler 2 puffs or 3 mL neb every 6 hours as needed for shortness of breath or wheezing. Notify if symptoms persist despite rescue inhaler/neb use. Continue supplemental oxygen 4 lpm for goal oxygen level >88-90%  Follow up with cardiology regarding the referral to the advanced heart failure clinic for your pulmonary hypertension   Follow up in three months with Dr. [name]. If symptoms do not improve or worsen, please contact office for sooner follow up or seek emergency care.    Chronic respiratory failure with hypoxia (HCC) Clinically stable.  She was able to return to her baseline after her last visit.  Continue 4 L/min supplemental O2 for goal greater than 88 to 90%.  Squamous cell carcinoma lung, unspecified  laterality (HCC) No evidence of recurrence on recent CT.  She has follow-up scheduled with oncology in August.  Pulmonary hypertension, unspecified ([place]) Severe pulmonary hypertension.  Referred by cardiology to the advanced heart failure clinic back in May.  She has yet to hear from them  to schedule an appointment.  Recommended that she contact Dr. Newman Nickels office to follow-up on this.  Chronic diastolic CHF (congestive heart failure) (HCC) Appears compensated on current regimen.  Follow-up with cardiology as scheduled.  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  No excessive bleeding/bruising.    I spent 28 minutes of dedicated to the care of this patient on the date of this encounter to include pre-visit review of records, face-to-face time with the patient discussing conditions above, post visit ordering of testing, clinical documentation with the electronic health record, making appropriate referrals as documented, and communicating necessary findings to members of the patients care team.  Clayton Bibles, NP 02/16/2022  Pt aware and understands NP's role.

## 2022-02-16 NOTE — Assessment & Plan Note (Signed)
Appears compensated on current regimen.  Follow-up with cardiology as scheduled.

## 2022-02-16 NOTE — Assessment & Plan Note (Addendum)
Clinically stable.  She was able to return to her baseline after her last visit.  Continue 4 L/min supplemental O2 for goal greater than 88 to 90%.

## 2022-02-16 NOTE — Patient Instructions (Addendum)
Continue Breztri 2 puffs Twice daily with spacer. Brush tongue and rinse mouth afterwards. Continue Albuterol inhaler 2 puffs or 3 mL neb every 6 hours as needed for shortness of breath or wheezing. Notify if symptoms persist despite rescue inhaler/neb use. Continue supplemental oxygen 4 lpm for goal oxygen level >88-90%  Follow up with cardiology regarding the referral to the advanced heart failure clinic for your pulmonary hypertension   Follow up in three months with Dr. Patsey Adams. If symptoms do not improve or worsen, please contact office for sooner follow up or seek emergency care.

## 2022-02-16 NOTE — Assessment & Plan Note (Addendum)
Improvement with change to [facility].  Appears compensated on current regimen with resolution of previous exacerbation.  Sent Rx for [employer] today.  Continue albuterol as needed.  Patient Instructions  Continue Breztri 2 puffs Twice daily with spacer. Brush tongue and rinse mouth afterwards. Continue Albuterol inhaler 2 puffs or 3 mL neb every 6 hours as needed for shortness of breath or wheezing. Notify if symptoms persist despite rescue inhaler/neb use. Continue supplemental oxygen 4 lpm for goal oxygen level >88-90%  Follow up with cardiology regarding the referral to the advanced heart failure clinic for your pulmonary hypertension   Follow up in three months with Dr. [name]. If symptoms do not improve or worsen, please contact office for sooner follow up or seek emergency care.

## 2022-02-18 LAB — HM MAMMOGRAPHY

## 2022-02-19 NOTE — Progress Notes (Signed)
Agree with the assessment and plan as noted by Marland Kitchen, NP  C. Derrill Kay, MD Advanced [facility]ch[facility]

## 2022-02-27 ENCOUNTER — Other Ambulatory Visit: Payer: Self-pay | Admitting: Nurse Practitioner

## 2022-02-27 DIAGNOSIS — E119 Type 2 diabetes mellitus without complications: Secondary | ICD-10-CM

## 2022-03-04 ENCOUNTER — Telehealth: Payer: Self-pay | Admitting: Cardiology

## 2022-03-04 NOTE — Telephone Encounter (Signed)
Patient's daughter called stating she hasn't heard from the CHF clinic.  She states she called them and they never replied to her.

## 2022-03-11 ENCOUNTER — Other Ambulatory Visit (HOSPITAL_COMMUNITY): Payer: Self-pay | Admitting: *Deleted

## 2022-03-11 ENCOUNTER — Ambulatory Visit (HOSPITAL_COMMUNITY)
Admission: RE | Admit: 2022-03-11 | Discharge: 2022-03-11 | Disposition: A | Discharge status: Home / Self Care | Payer: Medicare Other | Source: Ambulatory Visit | Attending: Cardiology | Admitting: Cardiology

## 2022-03-11 ENCOUNTER — Encounter (HOSPITAL_COMMUNITY): Payer: Self-pay | Admitting: Cardiology

## 2022-03-11 VITALS — BP 112/60 | HR 98 | Wt 133.0 lb

## 2022-03-11 DIAGNOSIS — I272 Pulmonary hypertension, unspecified: Secondary | ICD-10-CM

## 2022-03-11 DIAGNOSIS — I5032 Chronic diastolic (congestive) heart failure: Secondary | ICD-10-CM | POA: Diagnosis present

## 2022-03-11 LAB — CBC
HCT: 42.1 % (ref 36.0–46.0)
Hemoglobin: 13.3 g/dL (ref 12.0–15.0)
MCH: 28.7 pg (ref 26.0–34.0)
MCHC: 31.6 g/dL (ref 30.0–36.0)
MCV: 90.7 fL (ref 80.0–100.0)
Platelets: 307 10*3/uL (ref 150–400)
RBC: 4.64 MIL/uL (ref 3.87–5.11)
RDW: 17.1 % — ABNORMAL HIGH (ref 11.5–15.5)
WBC: 5.8 10*3/uL (ref 4.0–10.5)
nRBC: 0 % (ref 0.0–0.2)

## 2022-03-11 LAB — BASIC METABOLIC PANEL
Anion gap: 12 (ref 5–15)
BUN: 20 mg/dL (ref 8–23)
CO2: 20 mmol/L — ABNORMAL LOW (ref 22–32)
Calcium: 9.6 mg/dL (ref 8.9–10.3)
Chloride: 108 mmol/L (ref 98–111)
Creatinine, Ser: 1.05 mg/dL — ABNORMAL HIGH (ref 0.44–1.00)
GFR, Estimated: 53 mL/min — ABNORMAL LOW (ref >60)
Glucose, Bld: 166 mg/dL — ABNORMAL HIGH (ref 70–99)
Potassium: 4.3 mmol/L (ref 3.5–5.1)
Sodium: 140 mmol/L (ref 135–145)

## 2022-03-11 LAB — BRAIN NATRIURETIC PEPTIDE: B Natriuretic Peptide: 647.2 pg/mL — ABNORMAL HIGH (ref 0.0–100.0)

## 2022-03-11 MED ORDER — FUROSEMIDE 20 MG PO TABS: 40.0000 mg | ORAL_TABLET | Freq: Every day | ORAL | 2 refills | Status: DC

## 2022-03-11 NOTE — Patient Instructions (Addendum)
Great to see you today!!!  Increase Furosemide to 60 mg (3 tabs) Twice daily FOR 2 DAYS ONLY, then take 40 mg (2 tabs) Daily  Labs done today, your results will be available in MyChart, we will contact you for abnormal readings.  Heart Catheterization on 03/18/22, see instructions below  Your physician has recommended that you have a pulmonary function test, a chest x-ray, and a VQ Scan. Once insurance has approved these test we will call you to schedule  Your physician recommends that you schedule a follow-up appointment in: 3-4 weeks  If you have any questions or concerns before your next appointment please send [place] a message through [place] or call our office at [phone].    TO LEAVE A MESSAGE FOR THE NURSE SELECT OPTION 2, PLEASE LEAVE A MESSAGE INCLUDING: YOUR NAME DATE OF BIRTH CALL BACK NUMBER REASON FOR CALL**this is important as we prioritize the call backs  YOU WILL RECEIVE A CALL BACK THE SAME DAY AS LONG AS YOU CALL BEFORE 4:00 PM  At the [facility], you and your health needs are our priority. As part of our continuing mission to provide you with exceptional heart care, we have created designated Provider Care Teams. These Care Teams include your primary Cardiologist (physician) and Advanced Practice Providers (APPs- Physician Assistants and Nurse Practitioners) who all work together to provide you with the care you need, when you need it.   You may see any of the following providers on your designated Care Team at your next follow up: Dr Glori Bickers Dr Haynes Kerns, NP Lyda Jester, [facility] [place], [place] Audry Riles, PharmD   Please be sure to bring in all your medications bottles to every appointment.     CATHETERIZATION INSTRUCTIONS:  You are scheduled for a Cardiac Catheterization on Thursday, July 13 with Dr. Loralie Champagne.  1. Please arrive at the Main Entrance A at [facility]: [place],  [id number] at 11:30 AM (This time is two hours before your procedure to ensure your preparation). Free valet parking service is available.   Special note: Every effort is made to have your procedure done on time. Please understand that emergencies sometimes delay scheduled procedures.  2. Diet: Do not eat solid foods after midnight.  You may have clear liquids until 5 AM upon the day of the procedure.  3. Labs: DONE TODAY  4. Medication instructions in preparation for your procedure:   [facility] 7/12 PM DO NOT TAKE ELIQUIS  THURSDAY 7/13 AM DO NOT TAKE: ELIQUIS, FUROSEMIDE, JARDIANCE, METFORMIN   On the morning of your procedure, take any morning medicines NOT listed above.  You may use sips of water.  5. Plan to go home the same day, you will only stay overnight if medically necessary. 6. You MUST have a responsible adult to drive you home. 7. An adult MUST be with you the first 24 hours after you arrive home. 8. Bring a current list of your medications, and the last time and date medication taken. 9. Bring ID and current insurance cards. 10.Please wear clothes that are easy to get on and off and wear slip-on shoes.  Thank you for allowing [place] to care for you!   -- [facility]

## 2022-03-12 LAB — ENA+DNA/DS+SJORGEN'S
ENA SM Ab Ser-aCnc: <0.2 AI (ref 0.0–0.9)
Ribonucleic Protein: 1.8 AI — ABNORMAL HIGH (ref 0.0–0.9)
SSA (Ro) (ENA) Antibody, IgG: <0.2 AI (ref 0.0–0.9)
SSB (La) (ENA) Antibody, IgG: <0.2 AI (ref 0.0–0.9)
ds DNA Ab: 1 IU/mL (ref 0–9)

## 2022-03-12 LAB — CENTROMERE ANTIBODIES: Centromere Ab Screen: <0.2 AI (ref 0.0–0.9)

## 2022-03-12 LAB — RHEUMATOID FACTOR: Rheumatoid fact SerPl-aCnc: 10.8 IU/mL (ref <14.0)

## 2022-03-12 LAB — ANA W/REFLEX: Anti Nuclear Antibody (ANA): POSITIVE — AB

## 2022-03-12 LAB — ANTI-SCLERODERMA ANTIBODY: Scleroderma (Scl-70) (ENA) Antibody, IgG: <0.2 AI (ref 0.0–0.9)

## 2022-03-12 NOTE — H&P (View-Only) (Signed)
PCP: [name], [name] Cardiology: Dr. Gardiner Rhyme HF Cardiology: Dr. [name]  81 y.o. with history of COPD/emphysema, prior PE in 6/21, lung cancer, and pulmonary hypertension was referred by Dr. Gardiner Rhyme for evaluation of pulmonary hypertension. Patient had right lower lobe lung resection for cancer in 2018 then radiation on the left side in 2019.  CT chest in 5/23 showed advanced emphysema with radiation scarring left upper lobe. She is on home oxygen 4L [place].  Patient was admitted in 4/22 with CHF, echo at that time showed EF 55-60%, moderate RV enlargement with PASP 85 mmHg.  Echo repeated in 2/23 showed EF 60-65%, mild LVH, D-shaped septum, moderate RV dysfunction and moderate RV enlargement, PASP 90 mmHg, mild-moderate TR.  Subsequently RHC was done in 2/23 showing normal RA pressure but significantly elevated PCWP and severe pulmonary hypertension.  This appeared to be mixed pulmonary venous/pulmonary arterial hypertension.   Patient is quite limited.  She is short of breath walking around the house. This has gradually worsened over time. She has a chronic cough.  She denies orthopnea/PND.  No chest pain.  No lightheadedness/syncope.  She is taking 20 mg daily Lasix but has been gaining weight.   ECG (personally reviewed): NSR, left axis deviation, nonspecific ST-T changes  Labs (5/23): K 5.1, creatinine 0.93  PMH: 1. COPD: Emphysema.  Prior smoker.  - on home oxygen - CT chest (5/23): Advanced emphysema, radiation scarring left upper lobe.  2. H/o PE in 6/21 3. Type 2 diabetes  4. Lung cancer s/p RLL resection in 2018 and radiation in 2019.  5. Pulmonary hypertension: Echo (4/22) with EF 55-60%, moderate RV enlargement with PASP 85 mmHg.  - Echo (2/23) with EF 60-65%, mild LVH, D-shaped septum, moderate RV dysfunction and moderate RV enlargement, PASP 90 mmHg, mild-moderate TR.  - RHC (2/23): mean RA 2, PA 75/21 mean 42, mean PCWP 24, CI 3.37, PVR 3.3 WU  ROS: All systems reviewed and  negative except as per HPI.   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r-sr Pharmacist, hospital  Tobacco Use   Smoking status: Former    Packs/day: 1.00    Years: 58.00    Total pack years: 58.00    Types: Cigarettes    Quit date: 10/22/2015    Years since quitting: 6.3    Passive exposure: Past   Smokeless tobacco: Never   Tobacco comments:    Insurance account manager Use   Vaping Use: Never used  Substance and Sexual Activity   Alcohol use: Never   Drug use: Never   Sexual activity: Not Currently    Birth control/protection: Surgical    Comment: HYSTERECTOMY  Other Topics Concern   Not on file  Social History Narrative   Not on file   Social Determinants of Health   Financial Resource Strain: Low Risk  (10/21/2021)   Overall Financial Resource Strain (CARDIA)    Difficulty of Paying Living Expenses: Not hard at all  Food Insecurity: No Food Insecurity (10/21/2021)   Hunger Vital Sign    Worried About Running Out of Food in the Last Year: Never true    [place] in the Last Year: Never true  Transportation Needs:  No Transportation Needs (10/21/2021)   PRAPARE - Hydrologist (Medical): No    Lack of Transportation (Non-Medical): No  Physical Activity: Insufficiently Active (10/21/2021)   Exercise Vital Sign    Days of Exercise per Week: 3 days    Minutes of Exercise per Session: 40 min  Stress: No Stress Concern Present (10/21/2021)   [place]    Feeling of Stress : Not at all  Social Connections: Not on file  Intimate Partner Violence: Not on file   Family History  Problem Relation Age of Onset   Hypertension Mother    Heart  attack Father    Cancer Maternal Aunt        unknown type   Stomach cancer Neg Hx    Colon cancer Neg Hx    Esophageal cancer Neg Hx    Pancreatic cancer Neg Hx      Current Outpatient Medications  Medication Sig Dispense Refill   acetaminophen (TYLENOL) 500 MG tablet Take 1,000 mg by mouth every 6 (six) hours as needed for mild pain or fever.     albuterol (PROVENTIL) (2.5 MG/3ML) 0.083% nebulizer solution Take 3 mLs (2.5 mg total) by nebulization every 6 (six) hours as needed for wheezing or shortness of breath. 75 mL 12   albuterol (VENTOLIN HFA) 108 (90 Base) MCG/ACT inhaler TAKE 2 PUFFS BY MOUTH EVERY 6 HOURS AS NEEDED FOR WHEEZE OR SHORTNESS OF BREATH 8.5 each 2   apixaban (ELIQUIS) 2.5 MG TABS tablet Take 1 tablet (2.5 mg total) by mouth 2 (two) times daily. 60 tablet 5   Budeson-Glycopyrrol-Formoterol (BREZTRI AEROSPHERE) 160-9-4.8 MCG/ACT AERO Inhale 2 puffs into the lungs in the morning and at bedtime. 10.7 g 12   CALCIUM PO Take 1,200 mg by mouth daily.     Cinnamon 500 MG TABS Take 1,000 mg by mouth daily.      ferrous sulfate 325 (65 FE) MG EC tablet TAKE 1 TABLET BY MOUTH 2 TIMES DAILY WITH A MEAL. 90 tablet 1   JARDIANCE 10 MG TABS tablet TAKE 1 TABLET BY MOUTH EVERY DAY BEFORE BREAKFAST 30 tablet 3   losartan (COZAAR) 25 MG tablet Take 0.5 tablets (12.5 mg total) by mouth daily. 30 tablet 1   metFORMIN (GLUCOPHAGE) 1000 MG tablet TAKE 1 TABLET (1,000 MG TOTAL) BY MOUTH 2 (TWO) TIMES DAILY WITH A MEAL. 180 tablet 1   Multiple Vitamins-Minerals (CENTRUM SILVER 50+WOMEN PO) Take 1 tablet by mouth daily.     omeprazole (PRILOSEC) 40 MG capsule TAKE 1 CAPSULE BY MOUTH EVERY DAY 90 capsule 1   pravastatin (PRAVACHOL) 10 MG tablet TAKE 1 TABLET BY MOUTH EVERY DAY 90 tablet 1   pseudoephedrine-guaifenesin (MUCINEX D) 60-600 MG 12 hr tablet Take 1 tablet by mouth at bedtime.     Spacer/Aero-Holding Dorise Bullion Use with inhaler 1 each 2   vitamin C (ASCORBIC ACID) 500 MG tablet  Take 500 mg by mouth every other day. Tues Thurs Sat and Sun     vitamin E 180 MG (400 UNITS) capsule Take 400 Units by mouth every Monday, Wednesday, and Friday.     furosemide (LASIX) 20 MG tablet Take 2 tablets (40 mg total) by mouth daily. 180 tablet 2   No current facility-administered medications for this encounter.   BP 112/60   Pulse 98   Wt 60.3 kg (133 lb)   SpO2 (!) 85% Comment: Patient is on 4- liters of  room oxygen.  BMI 24.33 kg/m  General: tachypneic Neck: JVP 10-12 cm, no thyromegaly or thyroid nodule.  Lungs: Distant breath sounds CV: Nondisplaced PMI.  Heart regular S1/S2, no S3/S4, no murmur.  No peripheral edema.  No carotid bruit.  Normal pedal pulses.  Abdomen: Soft, nontender, no hepatosplenomegaly, no distention.  Skin: Intact without lesions or rashes.  Neurologic: Alert and oriented x 3.  Psych: Normal affect. Extremities: No clubbing or cyanosis.  HEENT: Normal.   Assessment/Plan:  1. RV failure: Echo in 2/23 showed EF 60-65%, mild LVH, D-shaped septum, moderate RV dysfunction and moderate RV enlargement, PASP 90 mmHg, mild-moderate TR.  She is volume overloaded on exam today with NYHA class IIIb symptoms.  - Increase Lasix to 40 qam/20 qpm x 2 days then 40 mg daily.  BMET/BNP today and BMET in 10 days.  2. COPD: She no longer smokes.  She has advanced emphysema by CT. She is on home oxygen.  3. H/o PE: She is on Eliquis.  4. Pulmonary hypertension: Echo in 2/23 showed EF 60-65%, mild LVH, D-shaped septum, moderate RV dysfunction and moderate RV enlargement, PASP 90 mmHg, mild-moderate TR.  RHC in 2/23 was somewhat surprising with elevated PCWP but normal RA pressure.  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  [facility] may be primarily group 3, though would consider group 1 component with PAH out of proportion to lung disease.  - I will get PFTs to try to define severity of underlying lung parenchymal disease.  - V/Q scan to rule  out chronic PEs.  - Serologic workup with ANA, anti-SCL 70, anti-centromere Ab, RF.  - Last RHC had unexpected numbers (low RA pressure, elevated PCWP). Today, RV failure seems most prominent.  I am going to repeat RHC to reassess filling pressures and PA pressure. We discussed risks/benefits and she agrees to procedure.   Followup 3 wks with APP to reassess volume status.   Loralie Champagne 03/12/2022

## 2022-03-12 NOTE — Progress Notes (Signed)
PCP: [name], [name] Cardiology: Dr. Gardiner Rhyme HF Cardiology: Dr. [name]  81 y.o. with history of COPD/emphysema, prior PE in 6/21, lung cancer, and pulmonary hypertension was referred by Dr. Gardiner Rhyme for evaluation of pulmonary hypertension. Patient had right lower lobe lung resection for cancer in 2018 then radiation on the left side in 2019.  CT chest in 5/23 showed advanced emphysema with radiation scarring left upper lobe. She is on home oxygen 4L .  Patient was admitted in 4/22 with CHF, echo at that time showed EF 55-60%, moderate RV enlargement with PASP 85 mmHg.  Echo repeated in 2/23 showed EF 60-65%, mild LVH, D-shaped septum, moderate RV dysfunction and moderate RV enlargement, PASP 90 mmHg, mild-moderate TR.  Subsequently RHC was done in 2/23 showing normal RA pressure but significantly elevated PCWP and severe pulmonary hypertension.  This appeared to be mixed pulmonary venous/pulmonary arterial hypertension.   Patient is quite limited.  She is short of breath walking around the house. This has gradually worsened over time. She has a chronic cough.  She denies orthopnea/PND.  No chest pain.  No lightheadedness/syncope.  She is taking 20 mg daily Lasix but has been gaining weight.   ECG (personally reviewed): NSR, left axis deviation, nonspecific ST-T changes  Labs (5/23): K 5.1, creatinine 0.93  PMH: 1. COPD: Emphysema.  Prior smoker.  - on home oxygen - CT chest (5/23): Advanced emphysema, radiation scarring left upper lobe.  2. H/o PE in 6/21 3. Type 2 diabetes  4. Lung cancer s/p RLL resection in 2018 and radiation in 2019.  5. Pulmonary hypertension: Echo (4/22) with EF 55-60%, moderate RV enlargement with PASP 85 mmHg.  - Echo (2/23) with EF 60-65%, mild LVH, D-shaped septum, moderate RV dysfunction and moderate RV enlargement, PASP 90 mmHg, mild-moderate TR.  - RHC (2/23): mean RA 2, PA 75/21 mean 42, mean PCWP 24, CI 3.37, PVR 3.3 WU  ROS: All systems reviewed and  negative except as per HPI.   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r-sr Pharmacist, hospital  Tobacco Use   Smoking status: Former    Packs/day: 1.00    Years: 58.00    Total pack years: 58.00    Types: Cigarettes    Quit date: 10/22/2015    Years since quitting: 6.3    Passive exposure: Past   Smokeless tobacco: Never   Tobacco comments:    Insurance account manager Use   Vaping Use: Never used  Substance and Sexual Activity   Alcohol use: Never   Drug use: Never   Sexual activity: Not Currently    Birth control/protection: Surgical    Comment: HYSTERECTOMY  Other Topics Concern   Not on file  Social History Narrative   Not on file   Social Determinants of Health   Financial Resource Strain: Low Risk  (10/21/2021)   Overall Financial Resource Strain (CARDIA)    Difficulty of Paying Living Expenses: Not hard at all  Food Insecurity: No Food Insecurity (10/21/2021)   Hunger Vital Sign    Worried About Running Out of Food in the Last Year: Never true    [place] in the Last Year: Never true  Transportation Needs:  No Transportation Needs (10/21/2021)   PRAPARE - Transportation    Lack of Transportation (Medical): No    Lack of Transportation (Non-Medical): No  Physical Activity: Insufficiently Active (10/21/2021)   Exercise Vital Sign    Days of Exercise per Week: 3 days    Minutes of Exercise per Session: 40 min  Stress: No Stress Concern Present (10/21/2021)   [employer] - Occupational Stress Questionnaire    Feeling of Stress : Not at all  Social Connections: Not on file  Intimate Partner Violence: Not on file   Family History  Problem Relation Age of Onset   Hypertension Mother    Heart  attack Father    Cancer Maternal Aunt        unknown type   Stomach cancer Neg Hx    Colon cancer Neg Hx    Esophageal cancer Neg Hx    Pancreatic cancer Neg Hx      Current Outpatient Medications  Medication Sig Dispense Refill   acetaminophen (TYLENOL) 500 MG tablet Take 1,000 mg by mouth every 6 (six) hours as needed for mild pain or fever.     albuterol (PROVENTIL) (2.5 MG/3ML) 0.083% nebulizer solution Take 3 mLs (2.5 mg total) by nebulization every 6 (six) hours as needed for wheezing or shortness of breath. 75 mL 12   albuterol (VENTOLIN HFA) 108 (90 Base) MCG/ACT inhaler TAKE 2 PUFFS BY MOUTH EVERY 6 HOURS AS NEEDED FOR WHEEZE OR SHORTNESS OF BREATH 8.5 each 2   apixaban (ELIQUIS) 2.5 MG TABS tablet Take 1 tablet (2.5 mg total) by mouth 2 (two) times daily. 60 tablet 5   Budeson-Glycopyrrol-Formoterol (BREZTRI AEROSPHERE) 160-9-4.8 MCG/ACT AERO Inhale 2 puffs into the lungs in the morning and at bedtime. 10.7 g 12   CALCIUM PO Take 1,200 mg by mouth daily.     Cinnamon 500 MG TABS Take 1,000 mg by mouth daily.      ferrous sulfate 325 (65 FE) MG EC tablet TAKE 1 TABLET BY MOUTH 2 TIMES DAILY WITH A MEAL. 90 tablet 1   JARDIANCE 10 MG TABS tablet TAKE 1 TABLET BY MOUTH EVERY DAY BEFORE BREAKFAST 30 tablet 3   losartan (COZAAR) 25 MG tablet Take 0.5 tablets (12.5 mg total) by mouth daily. 30 tablet 1   metFORMIN (GLUCOPHAGE) 1000 MG tablet TAKE 1 TABLET (1,000 MG TOTAL) BY MOUTH 2 (TWO) TIMES DAILY WITH A MEAL. 180 tablet 1   Multiple Vitamins-Minerals (CENTRUM SILVER 50+WOMEN PO) Take 1 tablet by mouth daily.     omeprazole (PRILOSEC) 40 MG capsule TAKE 1 CAPSULE BY MOUTH EVERY DAY 90 capsule 1   pravastatin (PRAVACHOL) 10 MG tablet TAKE 1 TABLET BY MOUTH EVERY DAY 90 tablet 1   pseudoephedrine-guaifenesin (MUCINEX D) 60-600 MG 12 hr tablet Take 1 tablet by mouth at bedtime.     Spacer/Aero-Holding Chambers DEVI Use with inhaler 1 each 2   vitamin C (ASCORBIC ACID) 500 MG tablet  Take 500 mg by mouth every other day. Tues Thurs Sat and Sun     vitamin E 180 MG (400 UNITS) capsule Take 400 Units by mouth every Monday, Wednesday, and Friday.     furosemide (LASIX) 20 MG tablet Take 2 tablets (40 mg total) by mouth daily. 180 tablet 2   No current facility-administered medications for this encounter.   BP 112/60   Pulse 98   Wt 60.3 kg (133 lb)   SpO2 (!) 85% Comment: Patient is on 4- liters of   room oxygen.  BMI 24.33 kg/m  General: tachypneic Neck: JVP 10-12 cm, no thyromegaly or thyroid nodule.  Lungs: Distant breath sounds CV: Nondisplaced PMI.  Heart regular S1/S2, no S3/S4, no murmur.  No peripheral edema.  No carotid bruit.  Normal pedal pulses.  Abdomen: Soft, nontender, no hepatosplenomegaly, no distention.  Skin: Intact without lesions or rashes.  Neurologic: Alert and oriented x 3.  Psych: Normal affect. Extremities: No clubbing or cyanosis.  HEENT: Normal.   Assessment/Plan:  1. RV failure: Echo in 2/23 showed EF 60-65%, mild LVH, D-shaped septum, moderate RV dysfunction and moderate RV enlargement, PASP 90 mmHg, mild-moderate TR.  She is volume overloaded on exam today with NYHA class IIIb symptoms.  - Increase Lasix to 40 qam/20 qpm x 2 days then 40 mg daily.  BMET/BNP today and BMET in 10 days.  2. COPD: She no longer smokes.  She has advanced emphysema by CT. She is on home oxygen.  3. H/o PE: She is on Eliquis.  4. Pulmonary hypertension: Echo in 2/23 showed EF 60-65%, mild LVH, D-shaped septum, moderate RV dysfunction and moderate RV enlargement, PASP 90 mmHg, mild-moderate TR.  RHC in 2/23 was somewhat surprising with elevated PCWP but normal RA pressure.  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  The [place] may be primarily group 3, though would consider group 1 component with PAH out of proportion to lung disease.  - I will get PFTs to try to define severity of underlying lung parenchymal disease.  - V/Q scan to rule  out chronic PEs.  - Serologic workup with ANA, anti-SCL 70, anti-centromere Ab, RF.  - Last RHC had unexpected numbers (low RA pressure, elevated PCWP). Today, RV failure seems most prominent.  I am going to repeat RHC to reassess filling pressures and PA pressure. We discussed risks/benefits and she agrees to procedure.   Followup 3 wks with APP to reassess volume status.   Loralie Champagne 03/12/2022

## 2022-03-18 ENCOUNTER — Other Ambulatory Visit: Payer: Self-pay

## 2022-03-18 ENCOUNTER — Ambulatory Visit (HOSPITAL_COMMUNITY)
Admission: RE | Admit: 2022-03-18 | Discharge: 2022-03-18 | Disposition: A | Discharge status: Home / Self Care | Payer: Medicare Other | Source: Ambulatory Visit | Attending: Cardiology | Admitting: Cardiology

## 2022-03-18 ENCOUNTER — Encounter (HOSPITAL_COMMUNITY)
Admission: RE | Disposition: A | Discharge status: Home / Self Care | Payer: Self-pay | Source: Ambulatory Visit | Attending: Cardiology

## 2022-03-18 DIAGNOSIS — I272 Pulmonary hypertension, unspecified: Secondary | ICD-10-CM

## 2022-03-18 DIAGNOSIS — E119 Type 2 diabetes mellitus without complications: Secondary | ICD-10-CM | POA: Insufficient documentation

## 2022-03-18 DIAGNOSIS — Z79899 Other long term (current) drug therapy: Secondary | ICD-10-CM | POA: Insufficient documentation

## 2022-03-18 DIAGNOSIS — Z7901 Long term (current) use of anticoagulants: Secondary | ICD-10-CM | POA: Insufficient documentation

## 2022-03-18 DIAGNOSIS — Z86711 Personal history of pulmonary embolism: Secondary | ICD-10-CM | POA: Diagnosis not present

## 2022-03-18 DIAGNOSIS — Z9981 Dependence on supplemental oxygen: Secondary | ICD-10-CM | POA: Diagnosis not present

## 2022-03-18 DIAGNOSIS — Z85118 Personal history of other malignant neoplasm of bronchus and lung: Secondary | ICD-10-CM | POA: Diagnosis not present

## 2022-03-18 DIAGNOSIS — Z7984 Long term (current) use of oral hypoglycemic drugs: Secondary | ICD-10-CM | POA: Insufficient documentation

## 2022-03-18 DIAGNOSIS — J449 Chronic obstructive pulmonary disease, unspecified: Secondary | ICD-10-CM | POA: Diagnosis not present

## 2022-03-18 DIAGNOSIS — I2721 Secondary pulmonary arterial hypertension: Secondary | ICD-10-CM | POA: Insufficient documentation

## 2022-03-18 DIAGNOSIS — Z87891 Personal history of nicotine dependence: Secondary | ICD-10-CM | POA: Diagnosis not present

## 2022-03-18 HISTORY — PX: RIGHT HEART CATH: CATH118263

## 2022-03-18 LAB — POCT I-STAT EG7
Acid-Base Excess: 2 mmol/L (ref 0.0–2.0)
Acid-Base Excess: 3 mmol/L — ABNORMAL HIGH (ref 0.0–2.0)
Bicarbonate: 26.6 mmol/L (ref 20.0–28.0)
Bicarbonate: 28.4 mmol/L — ABNORMAL HIGH (ref 20.0–28.0)
Calcium, Ion: 1.31 mmol/L (ref 1.15–1.40)
Calcium, Ion: 1.32 mmol/L (ref 1.15–1.40)
HCT: 40 % (ref 36.0–46.0)
HCT: 40 % (ref 36.0–46.0)
Hemoglobin: 13.6 g/dL (ref 12.0–15.0)
Hemoglobin: 13.6 g/dL (ref 12.0–15.0)
O2 Saturation: 59 %
O2 Saturation: 59 %
Potassium: 4 mmol/L (ref 3.5–5.1)
Potassium: 4.1 mmol/L (ref 3.5–5.1)
Sodium: 141 mmol/L (ref 135–145)
Sodium: 141 mmol/L (ref 135–145)
TCO2: 28 mmol/L (ref 22–32)
TCO2: 30 mmol/L (ref 22–32)
pCO2, Ven: 42.4 mmHg — ABNORMAL LOW (ref 44–60)
pCO2, Ven: 44.3 mmHg (ref 44–60)
pH, Ven: 7.406 (ref 7.25–7.43)
pH, Ven: 7.416 (ref 7.25–7.43)
pO2, Ven: 30 mmHg — CL (ref 32–45)
pO2, Ven: 30 mmHg — CL (ref 32–45)

## 2022-03-18 LAB — GLUCOSE, CAPILLARY
Glucose-Capillary: 137 mg/dL — ABNORMAL HIGH (ref 70–99)
Glucose-Capillary: 115 mg/dL — ABNORMAL HIGH (ref 70–99)

## 2022-03-18 LAB — BASIC METABOLIC PANEL
Anion gap: 6 (ref 5–15)
BUN: 19 mg/dL (ref 8–23)
CO2: 26 mmol/L (ref 22–32)
Calcium: 9.6 mg/dL (ref 8.9–10.3)
Chloride: 105 mmol/L (ref 98–111)
Creatinine, Ser: 0.99 mg/dL (ref 0.44–1.00)
GFR, Estimated: 57 mL/min — ABNORMAL LOW (ref >60)
Glucose, Bld: 154 mg/dL — ABNORMAL HIGH (ref 70–99)
Potassium: 4.9 mmol/L (ref 3.5–5.1)
Sodium: 137 mmol/L (ref 135–145)

## 2022-03-18 SURGERY — RIGHT HEART CATH
Anesthesia: LOCAL

## 2022-03-18 MED ORDER — HEPARIN (PORCINE) IN NACL 1000-0.9 UT/500ML-% IV SOLN
INTRAVENOUS | Status: AC
  Filled 2022-03-18: qty 500

## 2022-03-18 MED ORDER — SODIUM CHLORIDE 0.9 % IV SOLN: 250.0000 mL | INTRAVENOUS | Status: DC | PRN

## 2022-03-18 MED ORDER — ACETAMINOPHEN 325 MG PO TABS: 650.0000 mg | ORAL_TABLET | ORAL | Status: DC | PRN

## 2022-03-18 MED ORDER — HEPARIN (PORCINE) IN NACL 1000-0.9 UT/500ML-% IV SOLN
INTRAVENOUS | Status: DC | PRN
  Administered 2022-03-18: 500 mL

## 2022-03-18 MED ORDER — SODIUM CHLORIDE 0.9% FLUSH: 3.0000 mL | INTRAVENOUS | Status: DC | PRN

## 2022-03-18 MED ORDER — SODIUM CHLORIDE 0.9% FLUSH: 3.0000 mL | Freq: Two times a day (BID) | INTRAVENOUS | Status: DC

## 2022-03-18 MED ORDER — HYDRALAZINE HCL 20 MG/ML IJ SOLN: 10.0000 mg | INTRAMUSCULAR | Status: DC | PRN

## 2022-03-18 MED ORDER — SODIUM CHLORIDE 0.9 % IV SOLN: INTRAVENOUS | Status: DC

## 2022-03-18 MED ORDER — LIDOCAINE HCL (PF) 1 % IJ SOLN
INTRAMUSCULAR | Status: AC
  Filled 2022-03-18: qty 30

## 2022-03-18 MED ORDER — LIDOCAINE HCL (PF) 1 % IJ SOLN
INTRAMUSCULAR | Status: DC | PRN
  Administered 2022-03-18: 2 mL

## 2022-03-18 MED ORDER — ONDANSETRON HCL 4 MG/2ML IJ SOLN: 4.0000 mg | Freq: Four times a day (QID) | INTRAMUSCULAR | Status: DC | PRN

## 2022-03-18 MED ORDER — LABETALOL HCL 5 MG/ML IV SOLN: 10.0000 mg | INTRAVENOUS | Status: DC | PRN

## 2022-03-18 SURGICAL SUPPLY — 8 items
CATH BALLN WEDGE 5F 110CM (CATHETERS) ×1 IMPLANT
KIT HEART LEFT (KITS) ×2 IMPLANT
PACK CARDIAC CATHETERIZATION (CUSTOM PROCEDURE TRAY) ×2 IMPLANT
SHEATH GLIDE SLENDER 4/5FR (SHEATH) ×1 IMPLANT
TRANSDUCER W/STOPCOCK (MISCELLANEOUS) ×2 IMPLANT
TUBING ART PRESS 72  MALE/FEM (TUBING) ×1
TUBING ART PRESS 72 MALE/FEM (TUBING) IMPLANT
WIRE EMERALD 3MM-J .025X260CM (WIRE) ×1 IMPLANT

## 2022-03-18 NOTE — Discharge Instructions (Signed)
CALL DR MCLEAN'S OFFICE IF ANY PROBLEMS, QUESTIONS, OR CONCERNS; CALL IF ANY BLEEDING, DRAINAGE, FEVER, PAIN, SWELLING OR REDNESS AT RIGHT ARM SITE

## 2022-03-18 NOTE — Interval H&P Note (Signed)
History and Physical Interval Note:  03/18/2022 1:50 PM  Brianna Adams  has presented today for surgery, with the diagnosis of pulm Htn.  The various methods of treatment have been discussed with the patient and family. After consideration of risks, benefits and other options for treatment, the patient has consented to  Procedure(s): RIGHT HEART CATH (N/A) as a surgical intervention.  The patient's history has been reviewed, patient examined, no change in status, stable for surgery.  I have reviewed the patient's chart and labs.  Questions were answered to the patient's satisfaction.     Brianna Adams

## 2022-03-19 ENCOUNTER — Encounter (HOSPITAL_COMMUNITY): Payer: Self-pay | Admitting: Cardiology

## 2022-03-22 ENCOUNTER — Ambulatory Visit (HOSPITAL_COMMUNITY)
Admission: RE | Admit: 2022-03-22 | Discharge: 2022-03-22 | Disposition: A | Discharge status: Home / Self Care | Payer: Medicare Other | Source: Ambulatory Visit | Attending: Cardiology | Admitting: Cardiology

## 2022-03-22 DIAGNOSIS — I272 Pulmonary hypertension, unspecified: Secondary | ICD-10-CM | POA: Insufficient documentation

## 2022-03-22 MED ORDER — TECHNETIUM TO 99M ALBUMIN AGGREGATED
4.1000 | Freq: Once | INTRAVENOUS | Status: AC | PRN
  Administered 2022-03-22: 4.1 via INTRAVENOUS

## 2022-03-26 ENCOUNTER — Other Ambulatory Visit: Payer: Self-pay | Admitting: Oncology

## 2022-04-05 ENCOUNTER — Telehealth (HOSPITAL_COMMUNITY): Payer: Self-pay | Admitting: Surgery

## 2022-04-05 NOTE — Telephone Encounter (Signed)
Patient called to reschedule appt for Wednesday 8/2.  She and her daughter were agreeable and appt changed to August 22 at noon.

## 2022-04-07 ENCOUNTER — Encounter (HOSPITAL_COMMUNITY): Payer: Medicare Other

## 2022-04-09 ENCOUNTER — Other Ambulatory Visit: Payer: Self-pay | Admitting: Nurse Practitioner

## 2022-04-09 ENCOUNTER — Other Ambulatory Visit: Payer: Self-pay | Admitting: Oncology

## 2022-04-20 ENCOUNTER — Inpatient Hospital Stay: Payer: Medicare Other | Attending: Oncology

## 2022-04-20 ENCOUNTER — Inpatient Hospital Stay (HOSPITAL_BASED_OUTPATIENT_CLINIC_OR_DEPARTMENT_OTHER): Payer: Medicare Other | Admitting: Oncology

## 2022-04-20 ENCOUNTER — Encounter: Payer: Self-pay | Admitting: Oncology

## 2022-04-20 VITALS — BP 101/63 | HR 87 | Temp 98.0°F | Resp 18 | Wt 129.3 lb

## 2022-04-20 DIAGNOSIS — Z87891 Personal history of nicotine dependence: Secondary | ICD-10-CM | POA: Insufficient documentation

## 2022-04-20 DIAGNOSIS — Z809 Family history of malignant neoplasm, unspecified: Secondary | ICD-10-CM | POA: Diagnosis not present

## 2022-04-20 DIAGNOSIS — Z86711 Personal history of pulmonary embolism: Secondary | ICD-10-CM | POA: Diagnosis not present

## 2022-04-20 DIAGNOSIS — I2721 Secondary pulmonary arterial hypertension: Secondary | ICD-10-CM | POA: Diagnosis not present

## 2022-04-20 DIAGNOSIS — Z7901 Long term (current) use of anticoagulants: Secondary | ICD-10-CM | POA: Diagnosis not present

## 2022-04-20 DIAGNOSIS — Z85118 Personal history of other malignant neoplasm of bronchus and lung: Secondary | ICD-10-CM

## 2022-04-20 DIAGNOSIS — J9610 Chronic respiratory failure, unspecified whether with hypoxia or hypercapnia: Secondary | ICD-10-CM | POA: Insufficient documentation

## 2022-04-20 DIAGNOSIS — I509 Heart failure, unspecified: Secondary | ICD-10-CM | POA: Diagnosis not present

## 2022-04-20 DIAGNOSIS — Z9981 Dependence on supplemental oxygen: Secondary | ICD-10-CM | POA: Insufficient documentation

## 2022-04-20 DIAGNOSIS — D500 Iron deficiency anemia secondary to blood loss (chronic): Secondary | ICD-10-CM | POA: Insufficient documentation

## 2022-04-20 DIAGNOSIS — K648 Other hemorrhoids: Secondary | ICD-10-CM | POA: Insufficient documentation

## 2022-04-20 DIAGNOSIS — Z90710 Acquired absence of both cervix and uterus: Secondary | ICD-10-CM | POA: Insufficient documentation

## 2022-04-20 DIAGNOSIS — E119 Type 2 diabetes mellitus without complications: Secondary | ICD-10-CM | POA: Insufficient documentation

## 2022-04-20 LAB — CBC WITH DIFFERENTIAL/PLATELET
Abs Immature Granulocytes: 0.01 10*3/uL (ref 0.00–0.07)
Basophils Absolute: 0.1 10*3/uL (ref 0.0–0.1)
Basophils Relative: 1 %
Eosinophils Absolute: 0.1 10*3/uL (ref 0.0–0.5)
Eosinophils Relative: 2 %
HCT: 43.4 % (ref 36.0–46.0)
Hemoglobin: 13.8 g/dL (ref 12.0–15.0)
Immature Granulocytes: 0 %
Lymphocytes Relative: 25 %
Lymphs Abs: 1.5 10*3/uL (ref 0.7–4.0)
MCH: 28.8 pg (ref 26.0–34.0)
MCHC: 31.8 g/dL (ref 30.0–36.0)
MCV: 90.4 fL (ref 80.0–100.0)
Monocytes Absolute: 0.5 10*3/uL (ref 0.1–1.0)
Monocytes Relative: 8 %
Neutro Abs: 3.9 10*3/uL (ref 1.7–7.7)
Neutrophils Relative %: 64 %
Platelets: 253 10*3/uL (ref 150–400)
RBC: 4.8 MIL/uL (ref 3.87–5.11)
RDW: 16.5 % — ABNORMAL HIGH (ref 11.5–15.5)
WBC: 6 10*3/uL (ref 4.0–10.5)
nRBC: 0 % (ref 0.0–0.2)

## 2022-04-20 LAB — COMPREHENSIVE METABOLIC PANEL
ALT: 38 U/L (ref 0–44)
AST: 27 U/L (ref 15–41)
Albumin: 4 g/dL (ref 3.5–5.0)
Alkaline Phosphatase: 56 U/L (ref 38–126)
Anion gap: 14 (ref 5–15)
BUN: 28 mg/dL — ABNORMAL HIGH (ref 8–23)
CO2: 23 mmol/L (ref 22–32)
Calcium: 9.4 mg/dL (ref 8.9–10.3)
Chloride: 102 mmol/L (ref 98–111)
Creatinine, Ser: 1.02 mg/dL — ABNORMAL HIGH (ref 0.44–1.00)
GFR, Estimated: 55 mL/min — ABNORMAL LOW (ref >60)
Glucose, Bld: 165 mg/dL — ABNORMAL HIGH (ref 70–99)
Potassium: 4 mmol/L (ref 3.5–5.1)
Sodium: 139 mmol/L (ref 135–145)
Total Bilirubin: 0.8 mg/dL (ref 0.3–1.2)
Total Protein: 7.9 g/dL (ref 6.5–8.1)

## 2022-04-20 LAB — IRON AND TIBC
Iron: 46 ug/dL (ref 28–170)
Saturation Ratios: 12 % (ref 10.4–31.8)
TIBC: 398 ug/dL (ref 250–450)
UIBC: 352 ug/dL

## 2022-04-20 LAB — FERRITIN: Ferritin: 21 ng/mL (ref 11–307)

## 2022-04-20 MED ORDER — APIXABAN 2.5 MG PO TABS: 2.5000 mg | ORAL_TABLET | Freq: Two times a day (BID) | ORAL | 5 refills | Status: DC

## 2022-04-20 NOTE — Progress Notes (Signed)
Hematology/Oncology Progress note [phone] [phone]      Patient Care Team: Brianna Adams, Brianna Adams as PCP - General (General Practice) Brianna Server, MD as Consulting Physician (Oncology)  REFERRING PROVIDER: [name], [name]  CHIEF COMPLAINTS/REASON FOR VISIT:   history of pulmonary embolism.  Chronic anticoagulation.  HISTORY OF PRESENTING ILLNESS:   Brianna Adams is a  81 y.o.  female with PMH listed below was seen in consultation at the request of  Brianna Brine, [name]  for evaluation of history of pulmonary embolism.  Patient has been on oxygen via nasal cannula at home due to pulmonary hypertension.  She also has an early stage lung cancer status post a right lower lobe resection. 02/29/2020 patient presented emergency room for evaluation of shortness of breath.  Patient was found to have segmental PE of the right lower lobe with evidence of right heart strain.  And new area of the consolidation in the left lower lobe concerning for pneumonia.  Lower extremity ultrasound was negative for DVT.   Patient also was treated for pneumonia, acute decompensated CHF.Marland Kitchen  Patient was placed on heparin and discharged on Eliquis.  Patient takes Eliquis 5 mg twice daily since then on 05/08/2021, when she developed rectal bleeding.  Eliquis was decreased to 2.5 mg twice daily.  Patient currently is on nasal cannula oxygen 4 L.  Patient was accompanied by her daughter today.  Daughter reports history of long distance car trip-6 hours  prior to patient's PE diagnosis.Daughter is not sure about exactly the timeframe of immobilization event.  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  Patient follows up with [facility]anna [facility] for surveillance, lung emphysema, chronic respiratory failure.  Patient denies any additional episodes of rectal bleeding since decrease  to Eliquis 2.5 mg twice daily. Per patient and her daughter, patient has been quite active despite chronic respiratory failure.  Per daughter, patient walks faster than her. Patient has. been referred to establish care with gastroenterology.  08/13/2021, flexible sigmoidoscopy showed diverticulosis.  Nonbleeding internal hemorrhoids.  No specimen was collected.  INTERVAL HISTORY Brianna Adams is a 81 y.o. female who has above history reviewed by me today presents for follow up visit for history of pulmonary embolism. Patient tolerates Eliquis 2.5 mg daily.  She denies any bleeding events. She was companied by her daughter.  Patient's mobility is primarily limited due to chronic respiratory failure.  She reports having good balance and steady gait.  No recent falls. She tolerates well.   Chronic respiratory failure, on nasal cannula oxygen   Review of Systems  Constitutional:  Negative for appetite change, chills, fatigue and fever.  HENT:   Negative for hearing loss and voice change.   Eyes:  Negative for eye problems.  Respiratory:  Positive for shortness of breath. Negative for chest tightness, cough and hemoptysis.   Cardiovascular:  Negative for chest pain.  Gastrointestinal:  Negative for abdominal distention, abdominal pain and blood in stool.  Endocrine: Negative for hot flashes.  Genitourinary:  Negative for difficulty urinating and frequency.   Musculoskeletal:  Negative for arthralgias.  Skin:  Negative for itching and rash.  Neurological:  Negative for extremity weakness.  Hematological:  Negative for adenopathy.  Psychiatric/Behavioral:  Negative for confusion.     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   PE (pulmonary thromboembolism) ([place]) 02/29/2020   Pulmonary hypertension ([place])    Uterine cancer ([place])     SURGICAL HISTORY: Past Surgical History:  Procedure Laterality Date   ABDOMINAL HYSTERECTOMY     cataract surgery  2012 and 2017   COLONOSCOPY     FIBEROPTIC BRONCHOSCOPY     FLEXIBLE SIGMOIDOSCOPY N/A 08/13/2021   Procedure: FLEXIBLE SIGMOIDOSCOPY;  Surgeon: Brianna Creamer, MD;  [facility];  Service: Gastroenterology;  Laterality: N/A;   GANGLION CYST EXCISION Left 09/11/2020   Procedure: EXCISION VOLAR RADIAL GANGLION OF LEFT WRIST;  Surgeon: Brianna Brod, MD;  Location: [facility];  Service: Orthopedics;  Laterality: Left;  AXILLARY BLOCK   resection of lung cancer  2018   RIGHT HEART CATH N/A 10/20/2021   Procedure: RIGHT HEART CATH;  Surgeon: Leonie Man, MD;  Location: [facility];  Service: Cardiovascular;  Laterality: N/A;   RIGHT HEART CATH N/A 03/18/2022   Procedure: RIGHT HEART CATH;  Surgeon: Brianna Dresser, MD;  Location: [facility];  Service: Cardiovascular;  Laterality: N/A;   right lower lobe non-anatomocal lung resection wedge     right thorascoscopy      SOCIAL HISTORY: 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r-sr Pharmacist, hospital  Tobacco Use   Smoking status: Former    Packs/day: 1.00    Years: 58.00    Total pack years: 58.00    Types: Cigarettes    Quit date: 10/22/2015    Years since quitting: 6.4    Passive exposure: Past   Smokeless tobacco: Never   Tobacco comments:    Insurance account manager Use   Vaping Use: Never used  Substance and Sexual Activity   Alcohol use: Never   Drug use: Never   Sexual  activity: Not Currently    Birth control/protection: Surgical    Comment: HYSTERECTOMY  Other Topics Concern   Not on file  Social History Narrative   Not on file   Social Determinants of Health   Financial Resource Strain: Low Risk  (10/21/2021)   Overall Financial Resource Strain (CARDIA)    Difficulty of Paying Living Expenses: Not hard at all  Food Insecurity: No Food Insecurity (10/21/2021)   Hunger Vital Sign    Worried About Running Out of Food in the Last Year: Never true    [place] in the Last Year: Never true  Transportation Needs: No Transportation Needs (10/21/2021)   PRAPARE - Hydrologist (Medical): No    Lack of Transportation (Non-Medical): No  Physical Activity: Insufficiently Active (10/21/2021)   Exercise Vital Sign    Days of Exercise per Week: 3 days    Minutes of Exercise per Session: 40 min  Stress: No Stress Concern Present (10/21/2021)   [place] -  Occupational Stress Questionnaire    Feeling of Stress : Not at all  Social Connections: Not on file  Intimate Partner Violence: Not on file    FAMILY HISTORY: Family History  Problem Relation Age of Onset   Hypertension Mother    Heart attack Father    Cancer Maternal Aunt        unknown type   Stomach cancer Neg Hx    Colon cancer Neg Hx    Esophageal cancer Neg Hx    Pancreatic cancer Neg Hx     ALLERGIES:  is allergic to ace inhibitors, penicillin g, zestril [lisinopril], cortisone, hydrocortisone, lipitor [atorvastatin], mobic [meloxicam], and pravachol [pravastatin].  MEDICATIONS:  Current Outpatient Medications  Medication Sig Dispense Refill   beta carotene w/minerals (OCUVITE) tablet Take 1 tablet by mouth in the morning.     Budeson-Glycopyrrol-Formoterol (BREZTRI AEROSPHERE) 160-9-4.8 MCG/ACT AERO Inhale 2 puffs into the lungs in the morning and at bedtime. 10.7 g 12   CALCIUM PO Take 1,200 mg by mouth in the morning.      Cinnamon 500 MG TABS Take 1,000 mg by mouth in the morning.     docusate sodium (COLACE) 100 MG capsule TAKE 1 CAPSULE BY MOUTH EVERY DAY (Patient not taking: Reported on 04/20/2022) 60 capsule 1   ferrous sulfate 325 (65 FE) MG EC tablet TAKE 1 TABLET BY MOUTH 2 TIMES DAILY WITH A MEAL. (Patient taking differently: Take 325 mg by mouth in the morning.) 90 tablet 1   furosemide (LASIX) 20 MG tablet Take 2 tablets (40 mg total) by mouth daily. (Patient taking differently: Take 20 mg by mouth daily.) 180 tablet 2   JARDIANCE 10 MG TABS tablet TAKE 1 TABLET BY MOUTH EVERY DAY BEFORE BREAKFAST 30 tablet 3   losartan (COZAAR) 25 MG tablet TAKE 1/2 TABLET BY MOUTH EVERY DAY 45 tablet 1   metFORMIN (GLUCOPHAGE) 1000 MG tablet TAKE 1 TABLET (1,000 MG TOTAL) BY MOUTH 2 (TWO) TIMES DAILY WITH A MEAL. 180 tablet 1   Multiple Vitamins-Minerals (CENTRUM SILVER 50+WOMEN PO) Take 1 tablet by mouth in the morning.     omeprazole (PRILOSEC) 40 MG capsule TAKE 1 CAPSULE BY MOUTH EVERY DAY (Patient taking differently: Take 40 mg by mouth every evening.) 90 capsule 1   pravastatin (PRAVACHOL) 10 MG tablet TAKE 1 TABLET BY MOUTH EVERY DAY (Patient taking differently: Take 10 mg by mouth every evening.) 90 tablet 1   Spacer/Aero-Holding Chambers DEVI Use with inhaler 1 each 2   vitamin C (ASCORBIC ACID) 500 MG tablet Take 500 mg by mouth every Tuesday, Thursday, Saturday, and Sunday. In the morning.     vitamin E 180 MG (400 UNITS) capsule Take 400 Units by mouth every Monday, Wednesday, and Friday. In the morning.     acetaminophen (TYLENOL) 500 MG tablet Take 1,000 mg by mouth every 6 (six) hours as needed for mild pain or fever. (Patient not taking: Reported on 04/20/2022)     albuterol (PROVENTIL) (2.5 MG/3ML) 0.083% nebulizer solution Take 3 mLs (2.5 mg total) by nebulization every 6 (six) hours as needed for wheezing or shortness of breath. (Patient not taking: Reported on 04/20/2022) 75 mL 12   albuterol (VENTOLIN HFA)  108 (90 Base) MCG/ACT inhaler TAKE 2 PUFFS BY MOUTH EVERY 6 HOURS AS NEEDED FOR WHEEZE OR SHORTNESS OF BREATH (Patient not taking: Reported on 04/20/2022) 8.5 each 2   apixaban (ELIQUIS) 2.5 MG TABS tablet Take 1 tablet (2.5 mg total) by mouth 2 (two)  times daily. 60 tablet 5   pseudoephedrine-guaifenesin (MUCINEX D) 60-600 MG 12 hr tablet Take 1 tablet by mouth at bedtime. (Patient not taking: Reported on 04/20/2022)     No current facility-administered medications for this visit.     PHYSICAL EXAMINATION: ECOG PERFORMANCE STATUS: 1 - Symptomatic but completely ambulatory Vitals:   04/20/22 1341  BP: 101/63  Pulse: 87  Resp: 18  Temp: 98 F (36.7 C)  SpO2: 91%   Filed Weights   04/20/22 1341  Weight: 129 lb 4.8 oz (58.7 kg)    Physical Exam Constitutional:      General: She is not in acute distress.    Comments: Patient sits in the wheelchair  HENT:     Head: Normocephalic and atraumatic.  Eyes:     General: No scleral icterus. Cardiovascular:     Rate and Rhythm: Normal rate and regular rhythm.     Heart sounds: Normal heart sounds.  Pulmonary:     Effort: Pulmonary effort is normal. No respiratory distress.     Breath sounds: No wheezing.     Comments: Decreased breath sound bilaterally.  Patient breathes comfortably via nasal cannula oxygen Abdominal:     General: Bowel sounds are normal. There is no distension.     Palpations: Abdomen is soft.  Musculoskeletal:        General: No deformity. Normal range of motion.     Cervical back: Normal range of motion and neck supple.  Skin:    General: Skin is warm and dry.     Findings: No erythema or rash.  Neurological:     Mental Status: She is alert and oriented to person, place, and time. Mental status is at baseline.     Cranial Nerves: No cranial nerve deficit.     Coordination: Coordination normal.  Psychiatric:        Mood and Affect: Mood normal.     LABORATORY DATA:  I have reviewed the data as listed Lab  Results  Component Value Date   WBC 6.0 04/20/2022   HGB 13.8 04/20/2022   HCT 43.4 04/20/2022   MCV 90.4 04/20/2022   PLT 253 04/20/2022   Recent Labs    05/24/21 0730 05/24/21 1356 10/06/21 1221 03/11/22 1231 03/18/22 1219 03/18/22 1408 04/20/22 1316  NA 136  --    < > 140 137 141  141 139  K 4.4  --    < > 4.3 4.9 4.1  4.0 4.0  CL 101  --    < > 108 105  --  102  CO2 26  --    < > 20* 26  --  23  GLUCOSE 141*  --    < > 166* 154*  --  165*  BUN 19  --    < > 20 19  --  28*  CREATININE 0.86  --    < > 1.05* 0.99  --  1.02*  CALCIUM 9.6  --    < > 9.6 9.6  --  9.4  GFRNONAA >60  --    < > 53* 57*  --  55*  PROT 7.1 6.9  --   --   --   --  7.9  ALBUMIN 3.7 3.5  --   --   --   --  4.0  AST 18 13*  --   --   --   --  27  ALT 12 12  --   --   --   --  38  ALKPHOS 45 46  --   --   --   --  24  BILITOT 0.5 0.6  --   --   --   --  0.8  BILIDIR  --  <0.1  --   --   --   --   --   IBILI  --  NOT CALCULATED  --   --   --   --   --    < > = values in this interval not displayed.    Iron/TIBC/Ferritin/ %Sat    Component Value Date/Time   IRON 46 04/20/2022 1316   TIBC 398 04/20/2022 1316   FERRITIN 21 04/20/2022 1316   IRONPCTSAT 12 04/20/2022 1316       RADIOGRAPHIC STUDIES: I have personally reviewed the radiological images as listed and agreed with the findings in the report. NM Pulmonary Perfusion  Result Date: 03/22/2022 CLINICAL DATA:  Pulmonary hypertension.  Shortness of breath. EXAM: NUCLEAR MEDICINE PERFUSION LUNG SCAN TECHNIQUE: Perfusion images were obtained in multiple projections after intravenous injection of radiopharmaceutical. Ventilation scans intentionally deferred if perfusion scan and chest x-ray adequate for interpretation during COVID 19 epidemic. RADIOPHARMACEUTICALS:  4.1 mCi Tc-21mMAA IV COMPARISON:  Chest radiographs obtained today. Chest CT dated 01/20/2022. FINDINGS: Horizontally oriented linear defect along the area of linear scarring seen in  the lateral left mid lung zone on the chest radiographs earlier today and previously. Normal perfusion of the remainder of the lungs. IMPRESSION: 1. Chronic linear scarring on the left. 2. No evidence of acute or chronic pulmonary embolism. Electronically Signed   By: SClaudie ReveringM.D.   On: 03/22/2022 14:23   DG Chest 2 View  Result Date: 03/22/2022 CLINICAL DATA:  Shortness of breath. Pulmonary hypertension. Surgery for lung cancer in 2016 with radiation therapy. EXAM: CHEST - 2 VIEW COMPARISON:  12/24/2021 and chest CT dated 01/21/2022. FINDINGS: Stable linear scarring in the lateral left mid lung zone. Stable scarring at the lateral right lung base. The cardiac silhouette remains near the upper limit of normal in size. Stable mild prominence of the central pulmonary arteries. Stable diffuse prominence of the interstitial markings. Mild thoracic spine degenerative changes. Diffuse osteopenia. IMPRESSION: 1. No acute abnormality or evidence of tumor recurrence. 2. Stable mild prominence of the central pulmonary arteries compatible with the history of pulmonary hypertension. 3. Stable chronic interstitial lung disease and borderline cardiomegaly. Electronically Signed   By: SClaudie ReveringM.D.   On: 03/22/2022 14:15   CARDIAC CATHETERIZATION  Result Date: 03/18/2022 1. Normal right and left heart filling pressures. 2. Severe pulmonary arterial hypertension. Will await results of PFTs and V/Q scan.  Will then need to determine if [place] enough out of proportion to pulmonary parenchymal disease to treat with selective pulmonary vasodilators.       ASSESSMENT & PLAN:  1. Iron deficiency anemia due to chronic blood loss   2. History of pulmonary embolism   3. History of lung cancer    #History of pulmonary embolism, possible provoked event, however patient has multiple other contributing factors including history of cancer, sedentary lifestyle, limited mobility due to chronic respiratory failure. She  tolerates Eliquis 2.5 mg twice daily very well.  Recommend patient to continue. I asked patient and family to contact office if patient starts to have balancing issue/increased falls.   #Iron deficiency anemia, Labs are available after patient's visit.  Iron saturation 12, improved, ferritin 21. I recommend patient to continue ferrous sulfate 325 mg,  decrease to once daily.  #History of lung cancer,  01/16/2021 CT scan showed no evidence of disease recurrence. Patient follows up with pulmonology for annual CT scan.  570 2023, stable appearance CT chest without evidence of local carcinoma recurrence/active metastatic disease.  Radiation scarring, borderline mediastinal hilar lymph nodes also stable.  Orders Placed This Encounter  Procedures   CBC with Differential/Platelet    Standing Status:   Future    Number of Occurrences:   1    Standing Expiration Date:   04/21/2023   Comprehensive metabolic panel    Standing Status:   Future    Number of Occurrences:   1    Standing Expiration Date:   04/20/2023   Ferritin    Standing Status:   Future    Number of Occurrences:   1    Standing Expiration Date:   04/21/2023     All questions were answered. The patient knows to call the clinic with any problems questions or concerns.  cc Brianna Brine, [name]   I recommend patient to make a follow-up appointment with [place] in 6 months with lab, MD.  Brianna Server, MD, PhD [facility] Hematology Oncology 04/20/2022

## 2022-04-21 ENCOUNTER — Ambulatory Visit: Payer: Medicare Other | Admitting: Oncology

## 2022-04-21 ENCOUNTER — Other Ambulatory Visit: Payer: Self-pay | Admitting: Oncology

## 2022-04-21 ENCOUNTER — Other Ambulatory Visit: Payer: Medicare Other

## 2022-04-22 ENCOUNTER — Other Ambulatory Visit: Payer: Self-pay | Admitting: Oncology

## 2022-04-22 ENCOUNTER — Other Ambulatory Visit: Payer: Medicare Other

## 2022-04-22 ENCOUNTER — Ambulatory Visit: Payer: Medicare Other | Admitting: Oncology

## 2022-04-22 ENCOUNTER — Telehealth: Payer: Self-pay

## 2022-04-22 MED ORDER — FERROUS SULFATE 325 (65 FE) MG PO TBEC: 1.0000 | DELAYED_RELEASE_TABLET | Freq: Every day | ORAL | 1 refills | Status: DC

## 2022-04-22 NOTE — Telephone Encounter (Signed)
Per Dr. Tasia Catchings, Iron level has improved and she recommends to continue oral iron supplementation, decrease to once daily.   Unable to reach pt, VM left to inform pt.

## 2022-04-22 NOTE — Telephone Encounter (Signed)
-----  Message from Earlie Server, MD sent at 04/20/2022  8:19 PM EDT ----- MyChart message sent

## 2022-04-26 NOTE — Progress Notes (Signed)
PCP: [name], [name] Cardiology: Dr. Gardiner Rhyme HF Cardiology: Dr. [name]  81 y.o. with history of COPD/emphysema, prior PE in 6/21, lung cancer, and pulmonary hypertension was referred by Dr. Gardiner Rhyme for evaluation of pulmonary hypertension. Patient had right lower lobe lung resection for cancer in 2018 then radiation on the left side in 2019.  CT chest in 5/23 showed advanced emphysema with radiation scarring left upper lobe. She is on home oxygen 4L [place].  Patient was admitted in 4/22 with CHF, echo at that time showed EF 55-60%, moderate RV enlargement with PASP 85 mmHg.  Echo repeated in 2/23 showed EF 60-65%, mild LVH, D-shaped septum, moderate RV dysfunction and moderate RV enlargement, PASP 90 mmHg, mild-moderate TR.  Subsequently RHC was done in 2/23 showing normal RA pressure but significantly elevated PCWP and severe pulmonary hypertension.  This appeared to be mixed pulmonary venous/pulmonary arterial hypertension.   [place] 7/23 showed normal filling pressures and severe pulmonary arterial hypertension. Will await results of PFTs and V/Q scan.  Will then need to determine if [place] looks enough out of proportion to pulmonary parenchymal disease to treat with selective pulmonary vasodilators.  Today she returns for post [place] HF follow up with her daughter. She is quite limited and is generally short of breath with minimal activity. She feels more SOB since RHC. She has a chronic cough and is on 4L oxygen. Denies palpitations, abnormal bleeding, CP, dizziness, edema, or PND/Orthopnea. Appetite ok. No fever or chills. She does not weigh at home. Taking all medications. She goes to [employer] twice a week and is able to participate in exercises.  ECG (personally reviewed): NSR, nonspecific ST-T changes, HR 91 bpm  Labs (5/23): K 5.1, creatinine 0.93 Labs (8/23): K 4.0, creatinine 1.02  PMH: 1. COPD: Emphysema.  Prior smoker.  - on home oxygen - CT chest (5/23): Advanced emphysema, radiation  scarring left upper lobe.  2. H/o PE in 6/21 3. Type 2 diabetes  4. Lung cancer s/p RLL resection in 2018 and radiation in 2019.  5. Pulmonary hypertension: Echo (4/22) with EF 55-60%, moderate RV enlargement with PASP 85 mmHg.  - Echo (2/23) with EF 60-65%, mild LVH, D-shaped septum, moderate RV dysfunction and moderate RV enlargement, PASP 90 mmHg, mild-moderate TR.  - RHC (2/23): mean RA 2, PA 75/21 mean 42, mean PCWP 24, CI 3.37, PVR 3.3 WU - RHC (7/23): mean RA 6, PA 77/26, mean 44, mean PCWP 7, CI 2.37, PVR 10 WU  ROS: All systems reviewed and negative except as per HPI.   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r-sr Pharmacist, hospital  Tobacco Use   Smoking status: Former    Packs/day: 1.00    Years: 58.00    Total pack years: 58.00    Types: Cigarettes    Quit date: 10/22/2015    Years since quitting: 6.5    Passive exposure: Past   Smokeless tobacco: Never   Tobacco comments:    congratulated  Vaping Use   Vaping Use: Never used  Substance and Sexual Activity   Alcohol use: Never   Drug use: Never   Sexual activity: Not Currently    Birth control/protection: Surgical  Comment: HYSTERECTOMY  Other Topics Concern   Not on file  Social History Narrative   Not on file   Social Determinants of Health   Financial Resource Strain: Low Risk  (10/21/2021)   Overall Financial Resource Strain (CARDIA)    Difficulty of Paying Living Expenses: Not hard at all  Food Insecurity: No Food Insecurity (10/21/2021)   Hunger Vital Sign    Worried About Running Out of Food in the Last Year: Never true    Ran Out of Food in the Last Year: Never true  Transportation Needs: No Transportation Needs (10/21/2021)   PRAPARE - Armed forces logistics/support/administrative officer (Medical): No    Lack of Transportation (Non-Medical): No  Physical Activity: Insufficiently Active (10/21/2021)   Exercise Vital Sign    Days of Exercise per Week: 3 days    Minutes of Exercise per Session: 40 min  Stress: No Stress Concern Present (10/21/2021)   [place]    Feeling of Stress : Not at all  Social Connections: Not on file  Intimate Partner Violence: Not on file   Family History  Problem Relation Age of Onset   Hypertension Mother    Heart attack Father    Cancer Maternal Aunt        unknown type   Stomach cancer Neg Hx    Colon cancer Neg Hx    Esophageal cancer Neg Hx    Pancreatic cancer Neg Hx    Current Outpatient Medications  Medication Sig Dispense Refill   acetaminophen (TYLENOL) 500 MG tablet Take 1,000 mg by mouth every 6 (six) hours as needed for mild pain or fever.     albuterol (PROVENTIL) (2.5 MG/3ML) 0.083% nebulizer solution Take 3 mLs (2.5 mg total) by nebulization every 6 (six) hours as needed for wheezing or shortness of breath. 75 mL 12   albuterol (VENTOLIN HFA) 108 (90 Base) MCG/ACT inhaler TAKE 2 PUFFS BY MOUTH EVERY 6 HOURS AS NEEDED FOR WHEEZE OR SHORTNESS OF BREATH 8.5 each 2   apixaban (ELIQUIS) 2.5 MG TABS tablet Take 1 tablet (2.5 mg total) by mouth 2 (two) times daily. 60 tablet 5   beta carotene w/minerals (OCUVITE) tablet Take 1 tablet by mouth in the morning.     Budeson-Glycopyrrol-Formoterol (BREZTRI AEROSPHERE) 160-9-4.8 MCG/ACT AERO Inhale 2 puffs into the lungs in the morning and at bedtime. 10.7 g 12   CALCIUM PO Take 1,200 mg by mouth in the morning.     Cinnamon 500 MG TABS Take 1,000 mg by mouth at bedtime.     ferrous sulfate 325 (65 FE) MG EC tablet Take 1 tablet (325 mg total) by mouth daily. 90 tablet 1   furosemide (LASIX) 20 MG tablet Take 2 tablets (40 mg total) by mouth daily. 180 tablet 2   guaiFENesin (MUCINEX PO) Take by  mouth at bedtime.     JARDIANCE 10 MG TABS tablet TAKE 1 TABLET BY MOUTH EVERY DAY BEFORE BREAKFAST 30 tablet 3   losartan (COZAAR) 25 MG tablet TAKE 1/2 TABLET BY MOUTH EVERY DAY 45 tablet 1   metFORMIN (GLUCOPHAGE) 1000 MG tablet TAKE 1 TABLET (1,000 MG TOTAL) BY MOUTH 2 (TWO) TIMES DAILY WITH A MEAL. 180 tablet 1   Multiple Vitamins-Minerals (CENTRUM SILVER 50+WOMEN PO) Take 1 tablet by mouth in the morning.     omeprazole (PRILOSEC) 40 MG capsule TAKE 1 CAPSULE BY MOUTH EVERY DAY 90 capsule 1   pravastatin (PRAVACHOL)  10 MG tablet TAKE 1 TABLET BY MOUTH EVERY DAY 90 tablet 1   Spacer/Aero-Holding Dorise Bullion Use with inhaler 1 each 2   vitamin C (ASCORBIC ACID) 500 MG tablet Take 500 mg by mouth every Tuesday, Thursday, Saturday, and Sunday. In the morning.     vitamin E 180 MG (400 UNITS) capsule Take 400 Units by mouth every Monday, Wednesday, and Friday. In the morning.     No current facility-administered medications for this encounter.   Wt Readings from Last 3 Encounters:  04/27/22 59.2 kg (130 lb 9.6 oz)  04/20/22 58.7 kg (129 lb 4.8 oz)  03/18/22 60.3 kg (133 lb)   BP (!) 102/54   Pulse 95   Wt 59.2 kg (130 lb 9.6 oz)   SpO2 90% Comment: Patient is on 4-liters of room oxygen.  BMI 25.51 kg/m  Physical Exam: General:  NAD. Mild conversational dyspnea, arrived in [facility] on oxygen. HEENT: Normal Neck: Supple. No JVD. Carotids 2+ bilat; no bruits. No lymphadenopathy or thryomegaly appreciated. Cor: PMI nondisplaced. Regular rate & rhythm. No rubs, gallops or murmurs. Lungs: Diminished throughout Abdomen: Soft, nontender, nondistended. No hepatosplenomegaly. No bruits or masses. Good bowel sounds. Extremities: No cyanosis, clubbing, rash, edema Neuro: Alert & oriented x 3, cranial nerves grossly intact. Moves all 4 extremities w/o difficulty. Affect pleasant.   Assessment/Plan:  1. RV failure: Echo in 2/23 showed EF 60-65%, mild LVH, D-shaped septum, moderate RV dysfunction  and moderate RV enlargement, PASP 90 mmHg, mild-moderate TR.  She is not volume overloaded on exam today, weight down 4 lbs on higher dose of Lasix.  NYHA class IIIb symptoms, confounded by physical conditioning.  - Continue Lasix 40 mg daily.  Recent labs reviewed and stable, SCr 1.02, K 4.0 - Continue Jardiance 10 mg daily. - Continue losartan 12.5 mg daily. 2. COPD: She no longer smokes.  She has advanced emphysema by CT. She is on home oxygen.  3. H/o PE: She is on Eliquis. No bleeding issues. 4. Pulmonary hypertension: Echo in 2/23 showed EF 60-65%, mild LVH, D-shaped septum, moderate RV dysfunction and moderate RV enlargement, PASP 90 mmHg, mild-moderate TR.  RHC in 2/23 was somewhat surprising with elevated PCWP but normal RA pressure.  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  The [place] may be primarily group 3, though would consider group 1 component with PAH out of proportion to lung disease. Repeat RHC 7/23 showed normal right and left filling pressures and severe pulmonary arterial hypertension. - Arrange for PFTs to try to define severity of underlying lung parenchymal disease.  - V/Q scan negative for PEs.  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 Discussed with Dr. [name], will refer to Dr. Benjamine Mola with Rheumatology.  Follow up in 6 weeks with Dr. [name].  Maricela Bo [facility] [name]-BC 04/27/2022

## 2022-04-27 ENCOUNTER — Telehealth (HOSPITAL_COMMUNITY): Payer: Self-pay | Admitting: Surgery

## 2022-04-27 ENCOUNTER — Encounter (HOSPITAL_COMMUNITY): Payer: Self-pay

## 2022-04-27 ENCOUNTER — Ambulatory Visit (HOSPITAL_COMMUNITY)
Admission: RE | Admit: 2022-04-27 | Discharge: 2022-04-27 | Disposition: A | Discharge status: Home / Self Care | Payer: Medicare Other | Source: Ambulatory Visit | Attending: Family Medicine | Admitting: Family Medicine

## 2022-04-27 VITALS — BP 102/54 | HR 95 | Wt 130.6 lb

## 2022-04-27 DIAGNOSIS — Z87891 Personal history of nicotine dependence: Secondary | ICD-10-CM | POA: Diagnosis not present

## 2022-04-27 DIAGNOSIS — Z85118 Personal history of other malignant neoplasm of bronchus and lung: Secondary | ICD-10-CM | POA: Insufficient documentation

## 2022-04-27 DIAGNOSIS — Z7984 Long term (current) use of oral hypoglycemic drugs: Secondary | ICD-10-CM | POA: Insufficient documentation

## 2022-04-27 DIAGNOSIS — I5032 Chronic diastolic (congestive) heart failure: Secondary | ICD-10-CM

## 2022-04-27 DIAGNOSIS — Z9981 Dependence on supplemental oxygen: Secondary | ICD-10-CM | POA: Diagnosis not present

## 2022-04-27 DIAGNOSIS — Z79899 Other long term (current) drug therapy: Secondary | ICD-10-CM | POA: Diagnosis not present

## 2022-04-27 DIAGNOSIS — Z7901 Long term (current) use of anticoagulants: Secondary | ICD-10-CM | POA: Insufficient documentation

## 2022-04-27 DIAGNOSIS — M329 Systemic lupus erythematosus, unspecified: Secondary | ICD-10-CM

## 2022-04-27 DIAGNOSIS — J9611 Chronic respiratory failure with hypoxia: Secondary | ICD-10-CM

## 2022-04-27 DIAGNOSIS — J439 Emphysema, unspecified: Secondary | ICD-10-CM | POA: Insufficient documentation

## 2022-04-27 DIAGNOSIS — E119 Type 2 diabetes mellitus without complications: Secondary | ICD-10-CM | POA: Insufficient documentation

## 2022-04-27 DIAGNOSIS — Z923 Personal history of irradiation: Secondary | ICD-10-CM | POA: Diagnosis not present

## 2022-04-27 DIAGNOSIS — Z86711 Personal history of pulmonary embolism: Secondary | ICD-10-CM | POA: Diagnosis not present

## 2022-04-27 DIAGNOSIS — Z8249 Family history of ischemic heart disease and other diseases of the circulatory system: Secondary | ICD-10-CM | POA: Insufficient documentation

## 2022-04-27 DIAGNOSIS — I110 Hypertensive heart disease with heart failure: Secondary | ICD-10-CM | POA: Insufficient documentation

## 2022-04-27 DIAGNOSIS — I272 Pulmonary hypertension, unspecified: Secondary | ICD-10-CM

## 2022-04-27 LAB — BASIC METABOLIC PANEL
Anion gap: 13 (ref 5–15)
BUN: 23 mg/dL (ref 8–23)
CO2: 19 mmol/L — ABNORMAL LOW (ref 22–32)
Calcium: 9.7 mg/dL (ref 8.9–10.3)
Chloride: 106 mmol/L (ref 98–111)
Creatinine, Ser: 1.2 mg/dL — ABNORMAL HIGH (ref 0.44–1.00)
GFR, Estimated: 45 mL/min — ABNORMAL LOW (ref >60)
Glucose, Bld: 118 mg/dL — ABNORMAL HIGH (ref 70–99)
Potassium: 3.8 mmol/L (ref 3.5–5.1)
Sodium: 138 mmol/L (ref 135–145)

## 2022-04-27 LAB — BRAIN NATRIURETIC PEPTIDE: B Natriuretic Peptide: 739.2 pg/mL — ABNORMAL HIGH (ref 0.0–100.0)

## 2022-04-27 MED ORDER — POTASSIUM CHLORIDE CRYS ER 20 MEQ PO TBCR: 20.0000 meq | EXTENDED_RELEASE_TABLET | Freq: Every day | ORAL | 0 refills | Status: DC

## 2022-04-27 NOTE — Telephone Encounter (Signed)
I attempted to reach patient to review results and recommendations per provider.  I left a message for a return call. 

## 2022-04-27 NOTE — Telephone Encounter (Signed)
-----  Message from Rafael Bihari,  sent at 04/27/2022  2:52 PM EDT ----- BNP elevated, suggesting increased fluid, despite looking euvolemic on exam. Please have her increase her Lasix to 40 bid x 3 days w/ 20 KCL x 3 days, then back to 40 mg daily.  She will need repeat BMET and BNP in 10 days please.

## 2022-04-27 NOTE — Telephone Encounter (Signed)
Ms Fore daughter called back and I reviewed results and recommendations per provider.  She will return Sept 1 for repeat labwork.  I have updated medlist in [facility] as well as sent medications to pharmacy of choice. I also entered lab order in [facility].

## 2022-04-27 NOTE — Patient Instructions (Signed)
Be sure to take Lasix 40 mg (2 tabs) once daily  Labs today We will only contact you if something comes back abnormal or we need to make some changes. Otherwise no news is good news!  You have been referred to [facility]-Rheumatology (Dr Ignacia Marvel) -they will be in contact with an appointment  Your physician recommends that you schedule a follow-up appointment in: 6 weeks with Dr [name]  Do the following things EVERYDAY: Weigh yourself in the morning before breakfast. Write it down and keep it in a log. Take your medicines as prescribed Eat low salt foods--Limit salt (sodium) to 2000 mg per day.  Stay as active as you can everyday Limit all fluids for the day to less than 2 liters   At the [facility], you and your health needs are our priority. As part of our continuing mission to provide you with exceptional heart care, we have created designated Provider Care Teams. These Care Teams include your primary Cardiologist (physician) and Advanced Practice Providers (APPs- Physician Assistants and Nurse Practitioners) who all work together to provide you with the care you need, when you need it.   You may see any of the following providers on your designated Care Team at your next follow up: Dr Glori Bickers Dr Haynes Kerns, NP Lyda Jester, [place] [facility] [place], [place] Audry Riles, PharmD   Please be sure to bring in all your medications bottles to every appointment.   If you have any questions or concerns before your next appointment please send [place] a message through [place] or call our office at [phone].    TO LEAVE A MESSAGE FOR THE NURSE SELECT OPTION 2, PLEASE LEAVE A MESSAGE INCLUDING: YOUR NAME DATE OF BIRTH CALL BACK NUMBER REASON FOR CALL**this is important as we prioritize the call backs  YOU WILL RECEIVE A CALL BACK THE SAME DAY AS LONG AS YOU CALL BEFORE 4:00 PM

## 2022-05-05 ENCOUNTER — Ambulatory Visit: Payer: Medicare Other | Admitting: Cardiology

## 2022-05-05 NOTE — Progress Notes (Deleted)
Cardiology Office Note:    Date:  05/05/2022   ID:  Brianna Adams, DOB 1941-01-16, MRN [id number]  PCP:  [name], [name]  Cardiologist:  None  Electrophysiologist:  None   Referring MD: [name], [name]   No chief complaint on file.   History of Present Illness:    Brianna Adams is a 81 y.o. female with a hx of chronic diastolic heart failure, COPD, PE on Eliquis, T2DM who presents for follow-up.  She was referred by [name], [name] for evaluation of heart failure, initially seen on 01/08/2021.  She was admitted from 43/5 through 12/10/2020.  She presented with worsening shortness of breath.  Had to increase her home oxygen from 3 to 4 L.  Reports she had not been taking her Lasix regularly.  Chest x-ray showed pulmonary edema, BNP 24.  Echocardiogram 12/10/2020 showed LVEF 55 to 40%, grade 1 diastolic dysfunction, moderate RV enlargement, severe pulmonary hypertension (RVSP 85 mmHg).  She received IV diuresis with improvement in symptoms and was discharged on Lasix 20 mg daily.  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  Follows with pulmonology.  Echocardiogram 10/08/2021 showed EF 60 to 65%, interventricular septal flattening in systole and diastole consistent with RV pressure and volume overload, moderate RV systolic dysfunction, moderate RV enlargement, severely elevated pulmonary pressures (RVSP 90), mild to moderate TR, RAP 3.  [place] on 10/20/2021 showed RA 2, RV 76/0, PA 75/21/42, PCWP 24, PA sat 67%, Fick CI 3.4, thermodilution CI 2.4.  Since last clinic visit, reports has been doing okay.  Dyspnea has improved since recent inhaler change. Denies any chest pain, lightheadedness, syncope, lower extremity edema, or palpitations.   she reports she has been doing OK.  Does report has been having dyspnea.  Denies any chest pain, lightheadedness, syncope, lower extremity edema.  Reports has been having palpitations about 1-2 times per month, will last for 15 to 20 minutes, feels  like heart is racing.   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   PE (pulmonary thromboembolism) ([place]) 02/29/2020   Pulmonary hypertension ([place])    Uterine cancer [facility])     Past Surgical History:  Procedure Laterality Date   ABDOMINAL HYSTERECTOMY     cataract surgery  2012 and 2017   COLONOSCOPY     FIBEROPTIC BRONCHOSCOPY     FLEXIBLE SIGMOIDOSCOPY N/A 08/13/2021   Procedure: FLEXIBLE SIGMOIDOSCOPY;  Surgeon: Sharyn Creamer, MD;  Location: [facility];  Service: Gastroenterology;  Laterality: N/A;   GANGLION CYST EXCISION Left 09/11/2020   Procedure: EXCISION VOLAR RADIAL GANGLION OF LEFT WRIST;  Surgeon: Daryll Brod, MD;  Location: [place];  Service: Orthopedics;  Laterality: Left;  AXILLARY BLOCK   resection of lung cancer  2018   RIGHT HEART CATH N/A 10/20/2021   Procedure: RIGHT HEART CATH;  Surgeon: Leonie Man, MD;  Location: [facility];  Service: Cardiovascular;  Laterality: N/A;   RIGHT HEART CATH N/A 03/18/2022   Procedure: RIGHT HEART CATH;  Surgeon: Larey Dresser, MD;  Location: [place] [facility];  Service: Cardiovascular;  Laterality: N/A;   right lower lobe non-anatomocal lung resection wedge     right thorascoscopy      Current Medications: No outpatient medications have been marked as taking for the 05/05/22 encounter (Appointment) with Donato Heinz, MD.     Allergies:   Ace inhibitors, Penicillin g, Zestril [lisinopril], Cortisone, Hydrocortisone, Lipitor [atorvastatin], Mobic  [meloxicam], and Pravachol [pravastatin]   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r-sr Pharmacist, hospital  Tobacco Use   Smoking status: Former    Packs/day: 1.00    Years: 58.00    Total pack years: 58.00    Types: Cigarettes    Quit date: 10/22/2015    Years since quitting: 6.5    Passive exposure: Past   Smokeless tobacco: Never   Tobacco comments:    congratulated  Vaping Use   Vaping Use: Never used  Substance and Sexual Activity   Alcohol use: Never   Drug use: Never   Sexual activity: Not Currently    Birth control/protection: Surgical    Comment: HYSTERECTOMY  Other Topics Concern   Not on file  Social History Narrative   Not on file   Social Determinants of Health   Financial Resource Strain: Low Risk  (10/21/2021)   Overall Financial Resource Strain (CARDIA)    Difficulty of Paying Living Expenses: Not hard at all  Food Insecurity: No Food Insecurity (10/21/2021)   Hunger Vital Sign    Worried About Running Out of Food in the Last Year: Never true    [place] in the Last Year: Never true  Transportation Needs: No Transportation Needs (10/21/2021)   PRAPARE - Hydrologist (Medical): No    Lack of Transportation (Non-Medical): No  Physical Activity: Insufficiently Active (10/21/2021)   Exercise Vital Sign    Days of Exercise per Week: 3 days    Minutes of Exercise per Session: 40 min  Stress: No Stress Concern Present (10/21/2021)   [place]    Feeling of Stress : Not at all  Social Connections: Not on file     Family History: The patient's family history includes Cancer in her maternal aunt; Heart attack in her  father; Hypertension in her mother. There is no history of Stomach cancer, Colon cancer, Esophageal cancer, or Pancreatic cancer.  ROS:   Please see the history of present illness.    All other systems reviewed and are negative.  EKGs/Labs/Other Studies Reviewed:    The following studies were reviewed today:   EKG:   01/08/21: Sinus rhythm. Rate 75 bpm. No ST abnormalities, Left atrial enlargement  Recent Labs: 10/06/2021: Magnesium 2.1 04/20/2022: ALT 38; Hemoglobin 13.8; Platelets 253 04/27/2022: B Natriuretic Peptide 739.2; BUN 23; Creatinine, Ser 1.20; Potassium 3.8; Sodium 138  Recent Lipid Panel    Component Value Date/Time   CHOL 151 03/10/2021 0620   CHOL 189 03/03/2021 1150   TRIG 80 03/10/2021 0620   HDL 51 03/10/2021 0620  HDL 74 03/03/2021 1150   CHOLHDL 3.0 03/10/2021 0620   VLDL 16 03/10/2021 0620   LDLCALC 84 03/10/2021 0620   LDLCALC 102 (H) 03/03/2021 1150    Physical Exam:    VS:  There were no vitals taken for this visit.    Wt Readings from Last 3 Encounters:  04/27/22 130 lb 9.6 oz (59.2 kg)  04/20/22 129 lb 4.8 oz (58.7 kg)  03/18/22 133 lb (60.3 kg)     GEN: Well nourished, well developed in no acute distress HEENT: Normal NECK: No JVD; No carotid bruits CARDIAC: RRR, no murmurs, rubs, gallops RESPIRATORY:  Clear to auscultation without rales, wheezing or rhonchi  ABDOMEN: Soft, non-tender, non-distended MUSCULOSKELETAL:  No edema; No deformity  SKIN: Warm and dry NEUROLOGIC:  Alert and oriented x 3 PSYCHIATRIC:  Normal affect   ASSESSMENT:    No diagnosis found.    PLAN:     Severe pulmonary hypertension: RVSP 85 mmHg on echo 12/2020.  Likely combination of group 2 and group 3 PH.  Also could potentially be group 4 given history of PE.  Follows with pulmonology for her COPD.  Echocardiogram 10/08/2021 showed EF 60 to 65%, interventricular septal flattening in systole and diastole consistent with RV pressure and volume overload, moderate  RV systolic dysfunction, moderate RV enlargement, severely elevated pulmonary pressures (RVSP 90), mild to moderate TR, RAP 3.  [place] on 10/20/2021 showed RA 2, RV 76/0, PA 75/21/42, PCWP 24, PA sat 67%, Fick CI 3.4, thermodilution CI 2.4. -Advanced heart failure referral  Chronic diastolic heart failure: EF 12/2020 with normal EF, moderately enlarged RV, normal RV function, severe pulmonary hypertension -Continue Lasix 20 mg daily.   -Continue Jardiance 10 mg daily  Palpitations: Zio patch ordered previously but she did not wear.  Reports palpitations have resolved.  We will continue to monitor  Hyperlipidemia: On pravastatin 10 mg daily.  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  On 2.5 mg dose Eliquis due to anemia and issues with rectal bleeding  RTC in 3 months   Medication Adjustments/Labs and Tests Ordered: Current medicines are reviewed at length with the patient today.  Concerns regarding medicines are outlined above.  No orders of the defined types were placed in this encounter.  No orders of the defined types were placed in this encounter.   There are no Patient Instructions on file for this visit.    Signed, Donato Heinz, MD  05/05/2022 6:51 AM    [place]

## 2022-05-07 ENCOUNTER — Ambulatory Visit (HOSPITAL_COMMUNITY)
Admission: RE | Admit: 2022-05-07 | Discharge: 2022-05-07 | Disposition: A | Discharge status: Home / Self Care | Payer: Medicare Other | Source: Ambulatory Visit | Attending: Cardiology | Admitting: Cardiology

## 2022-05-07 DIAGNOSIS — I5032 Chronic diastolic (congestive) heart failure: Secondary | ICD-10-CM | POA: Diagnosis present

## 2022-05-07 LAB — BASIC METABOLIC PANEL
Anion gap: 9 (ref 5–15)
BUN: 26 mg/dL — ABNORMAL HIGH (ref 8–23)
CO2: 22 mmol/L (ref 22–32)
Calcium: 9.5 mg/dL (ref 8.9–10.3)
Chloride: 105 mmol/L (ref 98–111)
Creatinine, Ser: 1.05 mg/dL — ABNORMAL HIGH (ref 0.44–1.00)
GFR, Estimated: 53 mL/min — ABNORMAL LOW (ref >60)
Glucose, Bld: 130 mg/dL — ABNORMAL HIGH (ref 70–99)
Potassium: 4.2 mmol/L (ref 3.5–5.1)
Sodium: 136 mmol/L (ref 135–145)

## 2022-05-07 LAB — BRAIN NATRIURETIC PEPTIDE: B Natriuretic Peptide: 528.8 pg/mL — ABNORMAL HIGH (ref 0.0–100.0)

## 2022-05-13 ENCOUNTER — Ambulatory Visit: Payer: Medicare Other | Admitting: Nurse Practitioner

## 2022-05-14 ENCOUNTER — Other Ambulatory Visit (HOSPITAL_COMMUNITY): Payer: Medicare Other

## 2022-05-19 ENCOUNTER — Encounter: Payer: Self-pay | Admitting: Pulmonary Disease

## 2022-05-19 ENCOUNTER — Telehealth (HOSPITAL_COMMUNITY): Payer: Self-pay | Admitting: *Deleted

## 2022-05-19 ENCOUNTER — Ambulatory Visit (INDEPENDENT_AMBULATORY_CARE_PROVIDER_SITE_OTHER): Payer: Medicare Other | Admitting: Pulmonary Disease

## 2022-05-19 VITALS — BP 100/60 | HR 89 | Temp 97.5°F | Ht 62.0 in | Wt 129.0 lb

## 2022-05-19 DIAGNOSIS — J9611 Chronic respiratory failure with hypoxia: Secondary | ICD-10-CM

## 2022-05-19 DIAGNOSIS — C3491 Malignant neoplasm of unspecified part of right bronchus or lung: Secondary | ICD-10-CM

## 2022-05-19 DIAGNOSIS — Z87891 Personal history of nicotine dependence: Secondary | ICD-10-CM

## 2022-05-19 DIAGNOSIS — J449 Chronic obstructive pulmonary disease, unspecified: Secondary | ICD-10-CM | POA: Diagnosis not present

## 2022-05-19 DIAGNOSIS — I272 Pulmonary hypertension, unspecified: Secondary | ICD-10-CM | POA: Diagnosis not present

## 2022-05-19 NOTE — Patient Instructions (Signed)
Increase your Lasix to 2 tablets twice a day for 3 days this will be 40 mg twice a day for 3 days.  Let us know how this does for you.  Continue oxygen at 4 L/min with the rest and increased to 6 L/min with activity.  Please hold off on [place] activity for now.  We will see you in follow-up in 3 to 4 weeks time with either me or the nurse practitioner at that time.  Call sooner should any new problems arise.

## 2022-05-19 NOTE — Progress Notes (Signed)
Subjective:    Patient ID: Brianna Adams, female    DOB: [birth year], 81 y.o.   MRN: [id number] Patient Care Team: [name], Wood as PCP - General (General Practice) Earlie Server, MD as Consulting Physician (Oncology)  Chief Complaint  Patient presents with   Follow-up   HPI Brianna Adams is an 81 year old former smoker (85 PY) with a history as noted below who presents for follow-up on the issue of chronic respiratory failure with hypoxia on the basis of severe pulmonary hypertension.  She has a history of chronic diastolic heart failure, COPD, prior PE on Eliquis.  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  She was last seen by Roxan Diesel, NP on 16 February 2022.  Since with increasing shortness of breath and lower extremity edema.  She has not had any chest pain.  Just increasing shortness of breath above baseline.  She has severe pulmonary hypertension.  She has not had any fevers, chills or sweats.  No cough or sputum production.  No hemoptysis.  Has had some orthopnea.  No paroxysmal nocturnal dyspnea. Appears somewhat breathless today.  No diaphoresis.  No calf tenderness.  Review of Systems A 10 point review of systems was performed and it is as noted above otherwise negative.  Patient Active Problem List   Diagnosis Date Noted   Rectal bleeding    Mild non proliferative diabetic retinopathy ([place]) 08/05/2021   Encounter for preoperative pulmonary examination 07/21/2021   Iron deficiency anemia due to chronic blood loss 06/24/2021   Squamous cell carcinoma lung, unspecified laterality ([place]) 06/04/2021   Pneumonia of right upper lobe due to infectious organism 03/09/2021   CKD (chronic kidney disease), stage II 03/09/2021   Chronic diastolic CHF (congestive heart failure) ([place]) 03/09/2021   HTN (hypertension) 03/09/2021   HLD (hyperlipidemia) 03/09/2021   COPD with acute  exacerbation ([place]) 03/09/2021   Elevated troponin 03/09/2021   Type 2 diabetes mellitus with hyperlipidemia ([place]) 03/09/2021   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[place]) 12/31/2020   Lymphadenopathy    Pulmonary embolism ([place]) 02/29/2020   Chronic respiratory failure with hypoxia ([place]) 02/18/2020   Pulmonary nodule 02/18/2020   GERD (gastroesophageal reflux disease) 02/18/2020   Pulmonary hypertension, unspecified ([place]) 10/04/2019   COPD with chronic bronchitis and emphysema ([place]) 03/14/2019   Type 2 diabetes mellitus without complication, without long-term current use of insulin ([place]) 03/14/2019   Social History   Tobacco Use   Smoking status: Former    Packs/day: 1.00    Years: 58.00    Total pack years: 58.00    Types: Cigarettes    Quit date: 10/22/2015    Years since quitting: 6.5    Passive exposure: Past   Smokeless tobacco: Never   Tobacco comments:    congratulated  Substance Use Topics   Alcohol use: Never   Allergies  Allergen Reactions   Ace Inhibitors Swelling   Penicillin G Hives, Shortness Of Breath and Rash   Zestril [Lisinopril] Swelling   Cortisone Other (See Comments)    Unknown reaction   Hydrocortisone Other (See Comments)    Unknown reaction   Lipitor [Atorvastatin] Other (See Comments)    Myalgias (Muscle Pain) in high dosages     Mobic [Meloxicam] Other (See Comments)    Bloody stools   Pravachol [Pravastatin] Other (See Comments)    Chest Pain if high dosage  Current Meds  Medication Sig   acetaminophen (TYLENOL) 500 MG tablet Take 1,000 mg by mouth every 6 (six) hours as needed for mild pain or fever.   albuterol (PROVENTIL) (2.5 MG/3ML) 0.083% nebulizer solution Take 3 mLs (2.5 mg total) by nebulization every 6 (six) hours as needed for wheezing or shortness of breath.   albuterol (VENTOLIN HFA) 108 (90 Base) MCG/ACT inhaler TAKE 2 PUFFS BY MOUTH EVERY 6 HOURS AS NEEDED FOR WHEEZE OR SHORTNESS OF BREATH   apixaban (ELIQUIS) 2.5 MG TABS tablet Take  1 tablet (2.5 mg total) by mouth 2 (two) times daily.   beta carotene w/minerals (OCUVITE) tablet Take 1 tablet by mouth in the morning.   Budeson-Glycopyrrol-Formoterol (BREZTRI AEROSPHERE) 160-9-4.8 MCG/ACT AERO Inhale 2 puffs into the lungs in the morning and at bedtime.   CALCIUM PO Take 1,200 mg by mouth in the morning.   Cinnamon 500 MG TABS Take 1,000 mg by mouth at bedtime.   ferrous sulfate 325 (65 FE) MG EC tablet Take 1 tablet (325 mg total) by mouth daily.   furosemide (LASIX) 20 MG tablet Take 2 tablets (40 mg total) by mouth daily. (Patient taking differently: Take 20 mg by mouth 2 (two) times daily.)   guaiFENesin (MUCINEX PO) Take by mouth at bedtime.   JARDIANCE 10 MG TABS tablet TAKE 1 TABLET BY MOUTH EVERY DAY BEFORE BREAKFAST   losartan (COZAAR) 25 MG tablet TAKE 1/2 TABLET BY MOUTH EVERY DAY   metFORMIN (GLUCOPHAGE) 1000 MG tablet TAKE 1 TABLET (1,000 MG TOTAL) BY MOUTH 2 (TWO) TIMES DAILY WITH A MEAL.   Multiple Vitamins-Minerals (CENTRUM SILVER 50+WOMEN PO) Take 1 tablet by mouth in the morning.   omeprazole (PRILOSEC) 40 MG capsule TAKE 1 CAPSULE BY MOUTH EVERY DAY   pravastatin (PRAVACHOL) 10 MG tablet TAKE 1 TABLET BY MOUTH EVERY DAY   Spacer/Aero-Holding Dorise Bullion Use with inhaler   vitamin C (ASCORBIC ACID) 500 MG tablet Take 500 mg by mouth every Tuesday, Thursday, Saturday, and Sunday. In the morning.   vitamin E 180 MG (400 UNITS) capsule Take 400 Units by mouth every Monday, Wednesday, and Friday. In the morning.   Immunization History  Administered Date(s) Administered   Fluad Quad(high Dose 65+) 05/24/2019, 05/13/2020   Influenza, High Dose Seasonal PF 06/01/2021   Influenza-Unspecified 06/06/2018   PFIZER(Purple Top)SARS-COV-2 Vaccination 10/20/2019, 11/19/2019, 06/11/2020   Pfizer Covid-19 Vaccine Bivalent Booster 41yr & up 06/01/2021   Pneumococcal Conjugate-13 10/31/2013   Pneumococcal Polysaccharide-23 12/05/1997, 11/05/2007, 04/07/2015   Td  09/07/2003   Tdap 08/07/2015, 08/17/2017   Zoster Recombinat (Shingrix) 08/17/2017, 06/28/2019, 09/05/2019   Zoster, Live 11/05/2010       Objective:   Physical Exam BP 100/60 (BP Location: Left Arm, Patient Position: Sitting, Cuff Size: Normal)   Pulse 89   Temp (!) 97.5 F (36.4 C) (Oral)   Ht '5\' 2"'$  (1.575 m)   Wt 129 lb (58.5 kg)   SpO2 93%   BMI 23.59 kg/m patient on 4 L/min nasal cannula.  SpO2: 93 % O2 Device: Nasal cannula O2 Flow Rate (L/min): 4 L/min O2 Type: Continuous O2  GENERAL: Well-developed well-nourished elderly woman no acute distress, mild tachypnea, she presents in transport chair. HEAD: Normocephalic, atraumatic.  EYES: Pupils equal, round, reactive to light.  No scleral icterus.  MOUTH: Oral mucosa moist.  No thrush NECK: Supple. No thyromegaly. Trachea midline.  Significant JVD.  No adenopathy. PULMONARY: Distant breath sounds, coarse, no adventitious sounds. CARDIOVASCULAR: S1 and S2. Regular rate  and rhythm.  There is a grade 2/6 systolic ejection murmur left sternal border. GASTROINTESTINAL: Benign. MUSCULOSKELETAL: No joint deformity, no clubbing, +2 edema LE's.  NEUROLOGIC: No focal deficit.  Speech is fluent.  SKIN: Intact,warm,dry.  Limited exam no rashes PSYCH: Mood and behavior appropriate.   Ambulatory oximetry was performed today: Patient required 6 L/min of oxygen to maintain saturations at 88% or better during ambulation.  At one point transiently required 10 L but was able to be brought back down to 6 L/min.     Assessment & Plan:     ICD-10-CM   1. Severe pulmonary hypertension (HCC)  I27.20    Noted improvement previously when diuretic was increased Increase Lasix to 40 mg twice daily x 3 days Resume regular Lasix dose after 3 days Daily weights    2. Chronic respiratory failure with hypoxia (HCC)  J96.11    Increase O2 to 6 L/min with activity Continue O2 at 4 L/min at rest and with sleep Notified [facility]    3. COPD, group B,  by GOLD 2017 classification ([place])  J44.9    On Breztri 2 puffs twice a day Continue as needed albuterol PAH out of proportion to degree of COPD    4. Squamous cell carcinoma lung, right (HCC)  C34.91    Hx of SCCA right middle lobe and right lower lobe Underwent en bloc resection in [place] in May 2018 Suspected recurrence LUL + PET S/P SBRT 2019    5. Former smoker  Z87.891    58-pack-year history Quit 2017 No recurrence     We instructed the patient to hold off on any exercise activity i.e. Silver Sneakers for now.  Will see the patient in follow-up in 3 to 4 weeks time she is to call sooner should symptoms fail to improve, worsen or new issues arise in the interim.  She has upcoming appointments with cardiology.  Renold Don, MD Advanced Bronchoscopy PCCM [facility]    *This note was dictated using voice recognition software/Dragon.  Despite best efforts to proofread, errors can occur which can change the meaning. Any transcriptional errors that result from this process are unintentional and may not be fully corrected at the time of dictation.

## 2022-05-19 NOTE — Telephone Encounter (Signed)
Referral faxed to Dr.Beekman per daughters request.

## 2022-05-20 ENCOUNTER — Telehealth: Payer: Self-pay | Admitting: Pulmonary Disease

## 2022-05-20 DIAGNOSIS — J449 Chronic obstructive pulmonary disease, unspecified: Secondary | ICD-10-CM

## 2022-05-20 NOTE — Telephone Encounter (Signed)
Order has been placed to [facility]. Nothing further needed.

## 2022-05-21 ENCOUNTER — Other Ambulatory Visit: Payer: Self-pay | Admitting: Nurse Practitioner

## 2022-06-01 ENCOUNTER — Encounter (HOSPITAL_COMMUNITY): Payer: Self-pay | Admitting: Cardiology

## 2022-06-01 ENCOUNTER — Encounter: Payer: Self-pay | Admitting: Pulmonary Disease

## 2022-06-02 ENCOUNTER — Inpatient Hospital Stay (HOSPITAL_COMMUNITY)
Admission: EM | Admit: 2022-06-02 | Discharge: 2022-06-06 | DRG: 286 | Disposition: A | Discharge status: Home Health | Payer: Medicare Other | Attending: Internal Medicine | Admitting: Internal Medicine

## 2022-06-02 ENCOUNTER — Encounter: Payer: Self-pay | Admitting: Pulmonary Disease

## 2022-06-02 ENCOUNTER — Emergency Department (HOSPITAL_COMMUNITY): Payer: Medicare Other

## 2022-06-02 ENCOUNTER — Encounter (HOSPITAL_COMMUNITY): Payer: Self-pay | Admitting: Emergency Medicine

## 2022-06-02 ENCOUNTER — Inpatient Hospital Stay (HOSPITAL_COMMUNITY): Payer: Medicare Other

## 2022-06-02 ENCOUNTER — Other Ambulatory Visit: Payer: Self-pay

## 2022-06-02 ENCOUNTER — Other Ambulatory Visit (HOSPITAL_COMMUNITY): Payer: Medicare Other

## 2022-06-02 DIAGNOSIS — J9621 Acute and chronic respiratory failure with hypoxia: Secondary | ICD-10-CM | POA: Diagnosis present

## 2022-06-02 DIAGNOSIS — I272 Pulmonary hypertension, unspecified: Secondary | ICD-10-CM | POA: Diagnosis not present

## 2022-06-02 DIAGNOSIS — R600 Localized edema: Secondary | ICD-10-CM

## 2022-06-02 DIAGNOSIS — Z8542 Personal history of malignant neoplasm of other parts of uterus: Secondary | ICD-10-CM

## 2022-06-02 DIAGNOSIS — I2721 Secondary pulmonary arterial hypertension: Secondary | ICD-10-CM | POA: Diagnosis present

## 2022-06-02 DIAGNOSIS — N179 Acute kidney failure, unspecified: Secondary | ICD-10-CM | POA: Diagnosis present

## 2022-06-02 DIAGNOSIS — I2699 Other pulmonary embolism without acute cor pulmonale: Secondary | ICD-10-CM | POA: Diagnosis present

## 2022-06-02 DIAGNOSIS — Z7901 Long term (current) use of anticoagulants: Secondary | ICD-10-CM

## 2022-06-02 DIAGNOSIS — E7800 Pure hypercholesterolemia, unspecified: Secondary | ICD-10-CM | POA: Diagnosis present

## 2022-06-02 DIAGNOSIS — Z9981 Dependence on supplemental oxygen: Secondary | ICD-10-CM

## 2022-06-02 DIAGNOSIS — I2609 Other pulmonary embolism with acute cor pulmonale: Secondary | ICD-10-CM

## 2022-06-02 DIAGNOSIS — Z886 Allergy status to analgesic agent status: Secondary | ICD-10-CM | POA: Diagnosis not present

## 2022-06-02 DIAGNOSIS — E113293 Type 2 diabetes mellitus with mild nonproliferative diabetic retinopathy without macular edema, bilateral: Secondary | ICD-10-CM | POA: Diagnosis present

## 2022-06-02 DIAGNOSIS — K219 Gastro-esophageal reflux disease without esophagitis: Secondary | ICD-10-CM | POA: Diagnosis present

## 2022-06-02 DIAGNOSIS — R54 Age-related physical debility: Secondary | ICD-10-CM | POA: Diagnosis present

## 2022-06-02 DIAGNOSIS — Z7951 Long term (current) use of inhaled steroids: Secondary | ICD-10-CM

## 2022-06-02 DIAGNOSIS — Q211 Atrial septal defect, unspecified: Secondary | ICD-10-CM | POA: Diagnosis not present

## 2022-06-02 DIAGNOSIS — Z90710 Acquired absence of both cervix and uterus: Secondary | ICD-10-CM | POA: Diagnosis not present

## 2022-06-02 DIAGNOSIS — Z7984 Long term (current) use of oral hypoglycemic drugs: Secondary | ICD-10-CM

## 2022-06-02 DIAGNOSIS — Z8249 Family history of ischemic heart disease and other diseases of the circulatory system: Secondary | ICD-10-CM

## 2022-06-02 DIAGNOSIS — Z923 Personal history of irradiation: Secondary | ICD-10-CM | POA: Diagnosis not present

## 2022-06-02 DIAGNOSIS — Z85118 Personal history of other malignant neoplasm of bronchus and lung: Secondary | ICD-10-CM | POA: Diagnosis not present

## 2022-06-02 DIAGNOSIS — Z880 Allergy status to penicillin: Secondary | ICD-10-CM

## 2022-06-02 DIAGNOSIS — Z86711 Personal history of pulmonary embolism: Secondary | ICD-10-CM

## 2022-06-02 DIAGNOSIS — Z902 Acquired absence of lung [part of]: Secondary | ICD-10-CM | POA: Diagnosis not present

## 2022-06-02 DIAGNOSIS — Z87891 Personal history of nicotine dependence: Secondary | ICD-10-CM

## 2022-06-02 DIAGNOSIS — E1122 Type 2 diabetes mellitus with diabetic chronic kidney disease: Secondary | ICD-10-CM | POA: Diagnosis present

## 2022-06-02 DIAGNOSIS — N1831 Chronic kidney disease, stage 3a: Secondary | ICD-10-CM | POA: Diagnosis present

## 2022-06-02 DIAGNOSIS — I5033 Acute on chronic diastolic (congestive) heart failure: Principal | ICD-10-CM | POA: Diagnosis present

## 2022-06-02 DIAGNOSIS — J439 Emphysema, unspecified: Secondary | ICD-10-CM | POA: Diagnosis present

## 2022-06-02 DIAGNOSIS — Z79899 Other long term (current) drug therapy: Secondary | ICD-10-CM

## 2022-06-02 DIAGNOSIS — J449 Chronic obstructive pulmonary disease, unspecified: Secondary | ICD-10-CM | POA: Diagnosis not present

## 2022-06-02 DIAGNOSIS — Z515 Encounter for palliative care: Secondary | ICD-10-CM

## 2022-06-02 DIAGNOSIS — M351 Other overlap syndromes: Secondary | ICD-10-CM | POA: Diagnosis present

## 2022-06-02 DIAGNOSIS — Z888 Allergy status to other drugs, medicaments and biological substances status: Secondary | ICD-10-CM

## 2022-06-02 DIAGNOSIS — I214 Non-ST elevation (NSTEMI) myocardial infarction: Secondary | ICD-10-CM | POA: Diagnosis present

## 2022-06-02 DIAGNOSIS — Z809 Family history of malignant neoplasm, unspecified: Secondary | ICD-10-CM

## 2022-06-02 DIAGNOSIS — Z7189 Other specified counseling: Secondary | ICD-10-CM | POA: Diagnosis not present

## 2022-06-02 LAB — BASIC METABOLIC PANEL
Anion gap: 11 (ref 5–15)
BUN: 25 mg/dL — ABNORMAL HIGH (ref 8–23)
CO2: 21 mmol/L — ABNORMAL LOW (ref 22–32)
Calcium: 9.5 mg/dL (ref 8.9–10.3)
Chloride: 104 mmol/L (ref 98–111)
Creatinine, Ser: 1.21 mg/dL — ABNORMAL HIGH (ref 0.44–1.00)
GFR, Estimated: 45 mL/min — ABNORMAL LOW (ref >60)
Glucose, Bld: 170 mg/dL — ABNORMAL HIGH (ref 70–99)
Potassium: 4.8 mmol/L (ref 3.5–5.1)
Sodium: 136 mmol/L (ref 135–145)

## 2022-06-02 LAB — CBC WITH DIFFERENTIAL/PLATELET
Abs Immature Granulocytes: 0.02 10*3/uL (ref 0.00–0.07)
Basophils Absolute: 0.1 10*3/uL (ref 0.0–0.1)
Basophils Relative: 1 %
Eosinophils Absolute: 0.1 10*3/uL (ref 0.0–0.5)
Eosinophils Relative: 2 %
HCT: 43.7 % (ref 36.0–46.0)
Hemoglobin: 13.7 g/dL (ref 12.0–15.0)
Immature Granulocytes: 0 %
Lymphocytes Relative: 32 %
Lymphs Abs: 2 10*3/uL (ref 0.7–4.0)
MCH: 28.8 pg (ref 26.0–34.0)
MCHC: 31.4 g/dL (ref 30.0–36.0)
MCV: 91.8 fL (ref 80.0–100.0)
Monocytes Absolute: 0.6 10*3/uL (ref 0.1–1.0)
Monocytes Relative: 9 %
Neutro Abs: 3.5 10*3/uL (ref 1.7–7.7)
Neutrophils Relative %: 56 %
Platelets: 260 10*3/uL (ref 150–400)
RBC: 4.76 MIL/uL (ref 3.87–5.11)
RDW: 18.3 % — ABNORMAL HIGH (ref 11.5–15.5)
WBC: 6.3 10*3/uL (ref 4.0–10.5)
nRBC: 0 % (ref 0.0–0.2)

## 2022-06-02 LAB — CBG MONITORING, ED
Glucose-Capillary: 219 mg/dL — ABNORMAL HIGH (ref 70–99)
Glucose-Capillary: 192 mg/dL — ABNORMAL HIGH (ref 70–99)

## 2022-06-02 LAB — I-STAT ARTERIAL BLOOD GAS, ED
Acid-base deficit: 6 mmol/L — ABNORMAL HIGH (ref 0.0–2.0)
Bicarbonate: 18.7 mmol/L — ABNORMAL LOW (ref 20.0–28.0)
Calcium, Ion: 1.29 mmol/L (ref 1.15–1.40)
HCT: 41 % (ref 36.0–46.0)
Hemoglobin: 13.9 g/dL (ref 12.0–15.0)
O2 Saturation: 96 %
Patient temperature: 97.9
Potassium: 4.1 mmol/L (ref 3.5–5.1)
Sodium: 134 mmol/L — ABNORMAL LOW (ref 135–145)
TCO2: 20 mmol/L — ABNORMAL LOW (ref 22–32)
pCO2 arterial: 31.9 mmHg — ABNORMAL LOW (ref 32–48)
pH, Arterial: 7.373 (ref 7.35–7.45)
pO2, Arterial: 83 mmHg (ref 83–108)

## 2022-06-02 LAB — COMPREHENSIVE METABOLIC PANEL
ALT: 69 U/L — ABNORMAL HIGH (ref 0–44)
AST: 50 U/L — ABNORMAL HIGH (ref 15–41)
Albumin: 3.8 g/dL (ref 3.5–5.0)
Alkaline Phosphatase: 56 U/L (ref 38–126)
Anion gap: 14 (ref 5–15)
BUN: 25 mg/dL — ABNORMAL HIGH (ref 8–23)
CO2: 19 mmol/L — ABNORMAL LOW (ref 22–32)
Calcium: 9.7 mg/dL (ref 8.9–10.3)
Chloride: 104 mmol/L (ref 98–111)
Creatinine, Ser: 1.36 mg/dL — ABNORMAL HIGH (ref 0.44–1.00)
GFR, Estimated: 39 mL/min — ABNORMAL LOW (ref >60)
Glucose, Bld: 203 mg/dL — ABNORMAL HIGH (ref 70–99)
Potassium: 5.1 mmol/L (ref 3.5–5.1)
Sodium: 137 mmol/L (ref 135–145)
Total Bilirubin: 0.7 mg/dL (ref 0.3–1.2)
Total Protein: 7.3 g/dL (ref 6.5–8.1)

## 2022-06-02 LAB — CBC
HCT: 40.3 % (ref 36.0–46.0)
Hemoglobin: 13 g/dL (ref 12.0–15.0)
MCH: 28.4 pg (ref 26.0–34.0)
MCHC: 32.3 g/dL (ref 30.0–36.0)
MCV: 88.2 fL (ref 80.0–100.0)
Platelets: 251 10*3/uL (ref 150–400)
RBC: 4.57 MIL/uL (ref 3.87–5.11)
RDW: 18 % — ABNORMAL HIGH (ref 11.5–15.5)
WBC: 5.6 10*3/uL (ref 4.0–10.5)
nRBC: 0 % (ref 0.0–0.2)

## 2022-06-02 LAB — BRAIN NATRIURETIC PEPTIDE: B Natriuretic Peptide: 955.7 pg/mL — ABNORMAL HIGH (ref 0.0–100.0)

## 2022-06-02 LAB — MAGNESIUM: Magnesium: 1.7 mg/dL (ref 1.7–2.4)

## 2022-06-02 LAB — GLUCOSE, CAPILLARY
Glucose-Capillary: 133 mg/dL — ABNORMAL HIGH (ref 70–99)
Glucose-Capillary: 191 mg/dL — ABNORMAL HIGH (ref 70–99)

## 2022-06-02 LAB — TROPONIN I (HIGH SENSITIVITY)
Troponin I (High Sensitivity): 45 ng/L — ABNORMAL HIGH (ref <18)
Troponin I (High Sensitivity): 43 ng/L — ABNORMAL HIGH (ref <18)

## 2022-06-02 MED ORDER — ALBUTEROL SULFATE (2.5 MG/3ML) 0.083% IN NEBU: 3.0000 mL | INHALATION_SOLUTION | RESPIRATORY_TRACT | Status: DC | PRN

## 2022-06-02 MED ORDER — FUROSEMIDE 10 MG/ML IJ SOLN: 40.0000 mg | Freq: Two times a day (BID) | INTRAMUSCULAR | Status: DC

## 2022-06-02 MED ORDER — IPRATROPIUM-ALBUTEROL 0.5-2.5 (3) MG/3ML IN SOLN: 3.0000 mL | Freq: Four times a day (QID) | RESPIRATORY_TRACT | Status: DC

## 2022-06-02 MED ORDER — ACETAMINOPHEN 325 MG PO TABS: 650.0000 mg | ORAL_TABLET | Freq: Four times a day (QID) | ORAL | Status: DC | PRN

## 2022-06-02 MED ORDER — PRAVASTATIN SODIUM 10 MG PO TABS
10.0000 mg | ORAL_TABLET | Freq: Every day | ORAL | Status: DC
  Administered 2022-06-02 – 2022-06-06 (×5): 10 mg via ORAL
  Filled 2022-06-02 (×5): qty 1

## 2022-06-02 MED ORDER — PANTOPRAZOLE SODIUM 40 MG PO TBEC
40.0000 mg | DELAYED_RELEASE_TABLET | Freq: Every day | ORAL | Status: DC
  Administered 2022-06-02 – 2022-06-06 (×5): 40 mg via ORAL
  Filled 2022-06-02 (×5): qty 1

## 2022-06-02 MED ORDER — METHYLPREDNISOLONE SODIUM SUCC 125 MG IJ SOLR
125.0000 mg | Freq: Once | INTRAMUSCULAR | Status: AC
  Administered 2022-06-02: 125 mg via INTRAVENOUS
  Filled 2022-06-02: qty 2

## 2022-06-02 MED ORDER — ONDANSETRON HCL 4 MG/2ML IJ SOLN: 4.0000 mg | Freq: Four times a day (QID) | INTRAMUSCULAR | Status: DC | PRN

## 2022-06-02 MED ORDER — SENNOSIDES-DOCUSATE SODIUM 8.6-50 MG PO TABS: 1.0000 | ORAL_TABLET | Freq: Every evening | ORAL | Status: DC | PRN

## 2022-06-02 MED ORDER — ARFORMOTEROL TARTRATE 15 MCG/2ML IN NEBU
15.0000 ug | INHALATION_SOLUTION | Freq: Two times a day (BID) | RESPIRATORY_TRACT | Status: DC
  Administered 2022-06-02 – 2022-06-06 (×8): 15 ug via RESPIRATORY_TRACT
  Filled 2022-06-02 (×9): qty 2

## 2022-06-02 MED ORDER — FLUTICASONE FUROATE-VILANTEROL 200-25 MCG/ACT IN AEPB
1.0000 | INHALATION_SPRAY | Freq: Every day | RESPIRATORY_TRACT | Status: DC
  Administered 2022-06-02: 1 via RESPIRATORY_TRACT
  Filled 2022-06-02 (×2): qty 28

## 2022-06-02 MED ORDER — ACETAMINOPHEN 650 MG RE SUPP: 650.0000 mg | Freq: Four times a day (QID) | RECTAL | Status: DC | PRN

## 2022-06-02 MED ORDER — FUROSEMIDE 10 MG/ML IJ SOLN
80.0000 mg | Freq: Two times a day (BID) | INTRAMUSCULAR | Status: DC
  Administered 2022-06-02: 80 mg via INTRAVENOUS
  Filled 2022-06-02: qty 8

## 2022-06-02 MED ORDER — INSULIN ASPART 100 UNIT/ML IJ SOLN: 0.0000 [IU] | Freq: Every day | INTRAMUSCULAR | Status: DC

## 2022-06-02 MED ORDER — MAGNESIUM SULFATE IN D5W 1-5 GM/100ML-% IV SOLN
1.0000 g | Freq: Once | INTRAVENOUS | Status: AC
  Administered 2022-06-02: 1 g via INTRAVENOUS
  Filled 2022-06-02: qty 100

## 2022-06-02 MED ORDER — SODIUM CHLORIDE 0.9% FLUSH
3.0000 mL | Freq: Two times a day (BID) | INTRAVENOUS | Status: DC
  Administered 2022-06-02 – 2022-06-03 (×4): 3 mL via INTRAVENOUS

## 2022-06-02 MED ORDER — BUDESON-GLYCOPYRROL-FORMOTEROL 160-9-4.8 MCG/ACT IN AERO: 2.0000 | INHALATION_SPRAY | Freq: Two times a day (BID) | RESPIRATORY_TRACT | Status: DC

## 2022-06-02 MED ORDER — FUROSEMIDE 10 MG/ML IJ SOLN
80.0000 mg | INTRAMUSCULAR | Status: AC
  Administered 2022-06-02: 80 mg via INTRAVENOUS
  Filled 2022-06-02: qty 8

## 2022-06-02 MED ORDER — REVEFENACIN 175 MCG/3ML IN SOLN
175.0000 ug | Freq: Every day | RESPIRATORY_TRACT | Status: DC
  Administered 2022-06-02 – 2022-06-06 (×5): 175 ug via RESPIRATORY_TRACT
  Filled 2022-06-02 (×6): qty 3

## 2022-06-02 MED ORDER — UMECLIDINIUM BROMIDE 62.5 MCG/ACT IN AEPB
1.0000 | INHALATION_SPRAY | Freq: Every day | RESPIRATORY_TRACT | Status: DC
  Administered 2022-06-02: 1 via RESPIRATORY_TRACT
  Filled 2022-06-02 (×2): qty 7

## 2022-06-02 MED ORDER — EMPAGLIFLOZIN 10 MG PO TABS
10.0000 mg | ORAL_TABLET | Freq: Every day | ORAL | Status: DC
  Administered 2022-06-03 – 2022-06-06 (×4): 10 mg via ORAL
  Filled 2022-06-02 (×5): qty 1

## 2022-06-02 MED ORDER — ONDANSETRON HCL 4 MG PO TABS: 4.0000 mg | ORAL_TABLET | Freq: Four times a day (QID) | ORAL | Status: DC | PRN

## 2022-06-02 MED ORDER — APIXABAN 2.5 MG PO TABS
2.5000 mg | ORAL_TABLET | Freq: Two times a day (BID) | ORAL | Status: DC
  Administered 2022-06-02 – 2022-06-06 (×9): 2.5 mg via ORAL
  Filled 2022-06-02 (×9): qty 1

## 2022-06-02 MED ORDER — FUROSEMIDE 10 MG/ML IJ SOLN
40.0000 mg | Freq: Once | INTRAMUSCULAR | Status: AC
  Administered 2022-06-02: 40 mg via INTRAVENOUS
  Filled 2022-06-02: qty 4

## 2022-06-02 MED ORDER — INSULIN ASPART 100 UNIT/ML IJ SOLN
0.0000 [IU] | Freq: Three times a day (TID) | INTRAMUSCULAR | Status: DC
  Administered 2022-06-02: 1 [IU] via SUBCUTANEOUS
  Administered 2022-06-02: 3 [IU] via SUBCUTANEOUS
  Administered 2022-06-02 – 2022-06-03 (×3): 2 [IU] via SUBCUTANEOUS
  Administered 2022-06-04: 1 [IU] via SUBCUTANEOUS
  Administered 2022-06-04: 2 [IU] via SUBCUTANEOUS
  Administered 2022-06-04 – 2022-06-05 (×2): 1 [IU] via SUBCUTANEOUS
  Administered 2022-06-05: 2 [IU] via SUBCUTANEOUS

## 2022-06-02 NOTE — Progress Notes (Signed)
Lower extremity venous has been completed.   Preliminary results in CV Proc.   Brianna Adams 06/02/2022 3:33 PM

## 2022-06-02 NOTE — ED Notes (Signed)
Pt resting in bed, no new concerns at this time, medications administered, family and medication tech present at bedside, oral fluids given, will continue to monitor.

## 2022-06-02 NOTE — Progress Notes (Signed)
Per RN MD wanting to attempt to trail pt off bipap. Pt currently on 10L [place] at this time.

## 2022-06-02 NOTE — Consult Note (Signed)
NAME:  Brianna Adams, MRN:  [id number], DOB:  1941/08/20, LOS: 0 ADMISSION DATE:  06/02/2022, CONSULTATION DATE:  06/02/22 REFERRING MD:  Shirlee Latch, CHIEF COMPLAINT:  SOB   History of Present Illness:  81 year old woman with hx of COPD on HOT, groups 2/3 PAH, prior lung cancer presenting with 1-2 weeks of worsening SOB that worsened yesterday.  No cough, fevers, chills, sick contacts, N/V, aspiration symptoms.  Now needing 10L with desats to 80s with minimal activity.  Given lasix and between that and increased O2 she is feeling better.  Daughter at bedside to corroborate hx.  Ancillary info: Echo 10/08/21 Grade 1 diastolic dysfunction, RV dysfunction and RVSP 90  July 2023 [facility] Procedural Findings: Hemodynamics (mmHg) RA mean 6 RV 75/11 PA 77/26, mean 44 PCWP mean 7  Oxygen saturations: PA 59% AO 90%  Cardiac Output (Fick) 3.71  Cardiac Index (Fick) 2.37 PVR: 10 WU  03/22/22 VQ neg  Pertinent  Medical History  COPD Groups 2/3 pulm HTN PE on [facility] GERD DM  Significant Hospital Events: Including procedures, antibiotic start and stop dates in addition to other pertinent events   9/27 admit  Interim History / Subjective:  Consulted  Objective   Blood pressure (!) 100/58, pulse 80, temperature 97.6 F (36.4 C), resp. rate 19, SpO2 92 %.        Intake/Output Summary (Last 24 hours) at 06/02/2022 1322 Last data filed at 06/02/2022 1129 Gross per 24 hour  Intake --  Output 650 ml  Net -650 ml   There were no vitals filed for this visit.  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  Doubt VTE since on eliquis.  CXR does not look overly wet.  She has no signs or symptoms of AECOPD.  I think only possible reversible thing to do here is keep trying diuresis.  - Check echo w/ bubble - Check LE duplex - Push diuresis as tolerated by renal function - Change to yupelri/brovana with PRN albuterol - May need palliative involvement  Best Practice (right click and "Reselect all SmartList Selections" daily)   Per primary  Labs   CBC: Recent Labs  Lab 06/02/22 0045 06/02/22 0057 06/02/22 0352  WBC 6.3  --  5.6  NEUTROABS 3.5  --   --   HGB 13.7 13.9 13.0  HCT 43.7 41.0 40.3  MCV 91.8  --  88.2  PLT 260  --  251    Basic Metabolic Panel: Recent Labs  Lab 06/02/22 0045 06/02/22 0057 06/02/22 0352  NA 137 134* 136  K 5.1 4.1 4.8  CL 104  --  104  CO2 19*  --  21*  GLUCOSE 203*  --  170*  BUN 25*  --  25*  CREATININE 1.36*  --  1.21*  CALCIUM 9.7  --  9.5  MG  --   --  1.7   GFR: CrCl cannot be calculated (Unknown  ideal weight.). Recent Labs  Lab 06/02/22 0045 06/02/22 0352  WBC 6.3 5.6    Liver Function Tests: Recent Labs  Lab 06/02/22 0045  AST 50*  ALT 69*  ALKPHOS 56  BILITOT 0.7  PROT 7.3  ALBUMIN 3.8   No results for input(s): "LIPASE", "AMYLASE" in the last 168 hours. No results for input(s): "AMMONIA" in the last 168 hours.  ABG    Component Value Date/Time   PHART 7.373 06/02/2022 0057   PCO2ART 31.9 (L) 06/02/2022 0057   PO2ART 83 06/02/2022 0057   HCO3 18.7 (L) 06/02/2022 0057   TCO2 20 (L) 06/02/2022 0057   ACIDBASEDEF 6.0 (H) 06/02/2022 0057   O2SAT 96 06/02/2022 0057     Coagulation Profile: No results for input(s): "INR", "PROTIME" in the last 168 hours.  Cardiac Enzymes: No results for input(s): "CKTOTAL", "CKMB", "CKMBINDEX",  "TROPONINI" in the last 168 hours.  HbA1C: Hgb A1c MFr Bld  Date/Time Value Ref Range Status  01/07/2022 12:55 PM 6.7 (H) 4.8 - 5.6 % Final    Comment:             Prediabetes: 5.7 - 6.4          Diabetes: >6.4          Glycemic control for adults with diabetes: <7.0   10/06/2021 12:21 PM 6.7 (H) 4.8 - 5.6 % Final    Comment:             Prediabetes: 5.7 - 6.4          Diabetes: >6.4          Glycemic control for adults with diabetes: <7.0     CBG: Recent Labs  Lab 06/02/22 0809 06/02/22 1141  GLUCAP 219* 192*    Review of Systems:    Positive Symptoms in bold:  Constitutional fevers, chills, weight loss, fatigue, anorexia, malaise  Eyes decreased vision, double vision, eye irritation  Ears, Nose, Mouth, Throat sore throat, trouble swallowing, sinus congestion  Cardiovascular chest pain, paroxysmal nocturnal dyspnea, lower ext edema, palpitations   Respiratory SOB, cough, DOE, hemoptysis, wheezing  Gastrointestinal nausea, vomiting, diarrhea  Genitourinary burning with urination, trouble urinating  Musculoskeletal joint aches, joint swelling, back pain  Integumentary  rashes, skin lesions  Neurological focal weakness, focal numbness, trouble speaking, headaches  Psychiatric depression, anxiety, confusion  Endocrine polyuria, polydipsia, cold intolerance, heat intolerance  Hematologic abnormal bruising, abnormal bleeding, unexplained nose bleeds  Allergic/Immunologic recurrent infections, hives, swollen lymph nodes     Past Medical History:  She,  has a past medical history of CHF (congestive heart failure) (HCC), COPD (chronic obstructive pulmonary disease) (HCC), Diabetes (HCC), Diverticulitis, Dyspnea, GERD (gastroesophageal reflux disease), High cholesterol, Lung cancer (HCC), Mild non proliferative diabetic retinopathy (HCC) (08/05/2021), PE (pulmonary thromboembolism) (HCC) (02/29/2020), Pulmonary hypertension (HCC), and Uterine cancer (HCC).   Surgical  History:   Past Surgical History:  Procedure Laterality Date   ABDOMINAL HYSTERECTOMY     cataract surgery  2012 and 2017   COLONOSCOPY     FIBEROPTIC BRONCHOSCOPY     FLEXIBLE SIGMOIDOSCOPY N/A 08/13/2021   Procedure: FLEXIBLE SIGMOIDOSCOPY;  Surgeon: Imogene Burn, MD;  Location: [facility] ENDOSCOPY;  Service: Gastroenterology;  Laterality: N/A;   GANGLION CYST EXCISION Left 09/11/2020   Procedure: EXCISION VOLAR RADIAL GANGLION OF LEFT WRIST;  Surgeon: Cindee Salt, MD;  Location: [facility] OR;  Service: Orthopedics;  Laterality: Left;  AXILLARY BLOCK   resection of lung cancer  2018   RIGHT HEART CATH N/A  10/20/2021   Procedure: RIGHT HEART CATH;  Surgeon: Marykay Lex, MD;  Location: [facility] INVASIVE CV LAB;  Service: Cardiovascular;  Laterality: N/A;   RIGHT HEART CATH N/A 03/18/2022   Procedure: RIGHT HEART CATH;  Surgeon: Laurey Morale, MD;  Location[facility] INVASIVE CV LAB;  Service: Cardiovascular;  Laterality: N/A;   right lower lobe non-anatomocal lung resection wedge     right thorascoscopy       Social History:   reports that she quit smoking about 6 years ago. Her smoking use included cigarettes. She has a 58.00 pack-year smoking history. She has been exposed to tobacco smoke. She has never used smokeless tobacco. She reports that she does not drink alcohol and does not use drugs.   Family History:  Her family history includes Cancer in her maternal aunt; Heart attack in her father; Hypertension in her mother. There is no history of Stomach cancer, Colon cancer, Esophageal cancer, or Pancreatic cancer.   Allergies Allergies  Allergen Reactions   Ace Inhibitors Swelling   Penicillin G Hives, Shortness Of Breath and Rash   Zestril [Lisinopril] Swelling   Cortisone Other (See Comments)    Unknown reaction   Hydrocortisone Other (See Comments)    Unknown reaction   Lipitor [Atorvastatin] Other (See Comments)    Myalgias (Muscle Pain) in high dosages     Mobic [Meloxicam] Other (See  Comments)    Bloody stools   Pravachol [Pravastatin] Other (See Comments)    Chest Pain if high dosage      Home Medications  Prior to Admission medications   Medication Sig Start Date End Date Taking? Authorizing Provider  acetaminophen (TYLENOL) 500 MG tablet Take 1,000 mg by mouth every 6 (six) hours as needed for mild pain or fever. 01/30/17  Yes [provider]  albuterol (PROVENTIL) (2.5 MG/3ML) 0.083% nebulizer solution Take 3 mLs (2.5 mg total) by nebulization every 6 (six) hours as needed for wheezing or shortness of breath. 12/24/21  Yes Cobb, Ruby Cola, NP  albuterol (VENTOLIN HFA) 108 (90 Base) MCG/ACT inhaler TAKE 2 PUFFS BY MOUTH EVERY 6 HOURS AS NEEDED FOR WHEEZE OR SHORTNESS OF BREATH 11/16/21  Yes Arnette Felts, [name]  apixaban (ELIQUIS) 2.5 MG TABS tablet Take 1 tablet (2.5 mg total) by mouth 2 (two) times daily. 04/20/22  Yes Rickard Patience, MD  beta carotene w/minerals (OCUVITE) tablet Take 1 tablet by mouth in the morning.   Yes [provider]  Budeson-Glycopyrrol-Formoterol (BREZTRI AEROSPHERE) 160-9-4.8 MCG/ACT AERO Inhale 2 puffs into the lungs in the morning and at bedtime. 02/16/22  Yes Cobb, Ruby Cola, NP  CALCIUM PO Take 1,200 mg by mouth in the morning.   Yes [provider]  Cinnamon 500 MG TABS Take 1,000 mg by mouth at bedtime.   Yes [provider]  ferrous sulfate 325 (65 FE) MG EC tablet Take 1 tablet (325 mg total) by mouth daily. 04/22/22  Yes Rickard Patience, MD  furosemide (LASIX) 20 MG tablet Take 2 tablets (40 mg total) by mouth daily. Patient taking differently: Take 20 mg by mouth 2 (two) times daily. 03/11/22  Yes Laurey Morale, MD  JARDIANCE 10 MG TABS tablet TAKE 1 TABLET BY MOUTH EVERY DAY BEFORE BREAKFAST 05/21/22  Yes Arnette Felts, [name]  losartan (COZAAR) 25 MG tablet TAKE 1/2 TABLET BY MOUTH EVERY DAY 04/09/22  Yes Arnette Felts, [name]  metFORMIN (GLUCOPHAGE) 1000 MG tablet TAKE 1 TABLET (1,000 MG TOTAL) BY MOUTH 2 (TWO) TIMES  DAILY  WITH A MEAL. 01/31/22 01/31/23 Yes Arnette Felts, [name]  Multiple Vitamins-Minerals (CENTRUM SILVER 50+WOMEN PO) Take 1 tablet by mouth in the morning.   Yes [provider]  omeprazole (PRILOSEC) 40 MG capsule TAKE 1 CAPSULE BY MOUTH EVERY DAY 02/27/22  Yes Arnette Felts, [name]  pravastatin (PRAVACHOL) 10 MG tablet TAKE 1 TABLET BY MOUTH EVERY DAY 02/27/22  Yes Arnette Felts, [name]  Pseudoephedrine-guaiFENesin [facility] D PO) Take 5 mLs by mouth at bedtime.   Yes [provider]  vitamin C (ASCORBIC ACID) 500 MG tablet Take 500 mg by mouth every Tuesday, Thursday, Saturday, and Sunday. In the morning.   Yes [provider]  vitamin E 180 MG (400 UNITS) capsule Take 400 Units by mouth every Monday, Wednesday, and Friday. In the morning.   Yes [provider]  Spacer/Aero-Holding Rudean Curt Use with inhaler 12/24/21   Allison Quarry Ruby Cola, NP     Critical care time: N/A

## 2022-06-02 NOTE — ED Triage Notes (Addendum)
Patient here with shortness of breath all day.  Patient had to get Lasix increased earlier in the month, but shortness of breath continues.  Ankles with 1+ edema.  She also has swelling noted around eyes.  Patient denies any chest pain at this time.  Patient is tachypneic and on home O2 at 4L when sitting, 6L when walking, but she has been having it on 6L all day.

## 2022-06-02 NOTE — Telephone Encounter (Signed)
She needs to go to the ED immediately.

## 2022-06-02 NOTE — ED Provider Notes (Signed)
[facility]PART[facility] Provider Note   CSN: [id number] Arrival date & time: 06/02/22  0011     History {Add pertinent medical, surgical, social history, OB history to HPI:1} Chief Complaint  Patient presents with   Shortness of Breath    Brianna Adams is a 81 y.o. female.  81 y/o female with hx of PAH (on chronic 4L via [place]), PE (on chronic Eliquis), CHF (LVEF 60-65%), COPD, prior lung CA s/p lobectomy presents to the ED for SOB. Symptoms have been constant and worsening x 1.5 months. Patient usually with O2 sats of 88-92% on 4L via [place]. Was told by her pulmonologist to increase O2 to 6L when active following office evaluation 10 days ago. At this time, was also trialed on an increase of her Lasix x3 days without any significant change to her SOB symptoms. Daughter reports O2 sats have been in the 70's for the past week or so; got down to 69% at home tonight. Daughter feels that legs are more swollen with increased periorbital "puffiness". Patient denies fever, cough, chest pain, orthopnea, hemoptysis, syncope.   Shortness of Breath Associated symptoms: no cough and no fever        Home Medications Prior to Admission medications   Medication Sig Start Date End Date Taking? Authorizing Provider  acetaminophen (TYLENOL) 500 MG tablet Take 1,000 mg by mouth every 6 (six) hours as needed for mild pain or fever. 01/30/17   [provider]  albuterol (PROVENTIL) (2.5 MG/3ML) 0.083% nebulizer solution Take 3 mLs (2.5 mg total) by nebulization every 6 (six) hours as needed for wheezing or shortness of breath. 12/24/21   Cobb, Karie Schwalbe, NP  albuterol (VENTOLIN HFA) 108 (90 Base) MCG/ACT inhaler TAKE 2 PUFFS BY MOUTH EVERY 6 HOURS AS NEEDED FOR WHEEZE OR SHORTNESS OF BREATH 11/16/21   [name], [name]  apixaban (ELIQUIS) 2.5 MG TABS tablet Take 1 tablet (2.5 mg total) by mouth 2 (two) times daily. 04/20/22   Earlie Server, MD  beta carotene w/minerals (OCUVITE)  tablet Take 1 tablet by mouth in the morning.    [provider]  Budeson-Glycopyrrol-Formoterol (BREZTRI AEROSPHERE) 160-9-4.8 MCG/ACT AERO Inhale 2 puffs into the lungs in the morning and at bedtime. 02/16/22   Cobb, Karie Schwalbe, NP  CALCIUM PO Take 1,200 mg by mouth in the morning.    [provider]  Cinnamon 500 MG TABS Take 1,000 mg by mouth at bedtime.    [provider]  ferrous sulfate 325 (65 FE) MG EC tablet Take 1 tablet (325 mg total) by mouth daily. 04/22/22   Earlie Server, MD  furosemide (LASIX) 20 MG tablet Take 2 tablets (40 mg total) by mouth daily. Patient taking differently: Take 20 mg by mouth 2 (two) times daily. 03/11/22   Larey Dresser, MD  guaiFENesin (MUCINEX PO) Take by mouth at bedtime.    [provider]  JARDIANCE 10 MG TABS tablet TAKE 1 TABLET BY MOUTH EVERY DAY BEFORE BREAKFAST 05/21/22   [name], [name]  losartan (COZAAR) 25 MG tablet TAKE 1/2 TABLET BY MOUTH EVERY DAY 04/09/22   [name], [name]  metFORMIN (GLUCOPHAGE) 1000 MG tablet TAKE 1 TABLET (1,000 MG TOTAL) BY MOUTH 2 (TWO) TIMES DAILY WITH A MEAL. 01/31/22 01/31/23  [name], [name]  Multiple Vitamins-Minerals (CENTRUM SILVER 50+WOMEN PO) Take 1 tablet by mouth in the morning.    [provider]  omeprazole (PRILOSEC) 40 MG capsule TAKE 1 CAPSULE BY MOUTH EVERY DAY  02/27/22   [name], [name]  pravastatin (PRAVACHOL) 10 MG tablet TAKE 1 TABLET BY MOUTH EVERY DAY 02/27/22   [name], [name]  Spacer/Aero-Holding [facility] Use with inhaler 12/24/21   Belenda Cruise, Karie Schwalbe, NP  vitamin C (ASCORBIC ACID) 500 MG tablet Take 500 mg by mouth every Tuesday, Thursday, Saturday, and Sunday. In the morning.    [provider]  vitamin E 180 MG (400 UNITS) capsule Take 400 Units by mouth every Monday, Wednesday, and Friday. In the morning.    [provider]      Allergies    Ace inhibitors, Penicillin g, Zestril [lisinopril], Cortisone, Hydrocortisone,  Lipitor [atorvastatin], Mobic [meloxicam], and Pravachol [pravastatin]    Review of Systems   Review of Systems  Constitutional:  Negative for fever.  Respiratory:  Positive for shortness of breath. Negative for cough.   Ten systems reviewed and are negative for acute change, except as noted in the HPI.    Physical Exam Updated Vital Signs BP 105/72   Pulse 81   Temp 97.9 F (36.6 C)   Resp 20   SpO2 90%   Physical Exam Vitals and nursing note reviewed.  Constitutional:      General: She is not in acute distress.    Appearance: She is well-developed. She is not diaphoretic.     Comments: Nontoxic appearing AA female.  HENT:     Head: Normocephalic and atraumatic.  Eyes:     General: No scleral icterus.    Conjunctiva/sclera: Conjunctivae normal.  Pulmonary:     Effort: Pulmonary effort is normal. No respiratory distress.     Comments: No overt wheezing or rales. Dyspneic and tachypneic. Patient speaking in truncated sentences, but in not acute distress. SpO2 74% on 6L via [place]; transitioned to 15L NRB. Musculoskeletal:        General: Normal range of motion.     Cervical back: Normal range of motion.     Comments: 1+ pitting edema BLE  Skin:    General: Skin is warm and dry.     Coloration: Skin is not pale.     Findings: No erythema or rash.  Neurological:     Mental Status: She is alert and oriented to person, place, and time.     Coordination: Coordination normal.  Psychiatric:        Behavior: Behavior normal.     ED Results / Procedures / Treatments   Labs (all labs ordered are listed, but only abnormal results are displayed) Labs Reviewed  CBC WITH DIFFERENTIAL/PLATELET - Abnormal; Notable for the following components:      Result Value   RDW 18.3 (*)    All other components within normal limits  COMPREHENSIVE METABOLIC PANEL - Abnormal; Notable for the following components:   CO2 19 (*)    Glucose, Bld 203 (*)    BUN 25 (*)    Creatinine, Ser 1.36 (*)     AST 50 (*)    ALT 69 (*)    GFR, Estimated 39 (*)    All other components within normal limits  BRAIN NATRIURETIC PEPTIDE - Abnormal; Notable for the following components:   B Natriuretic Peptide 955.7 (*)    All other components within normal limits  I-STAT ARTERIAL BLOOD GAS, ED - Abnormal; Notable for the following components:   pCO2 arterial 31.9 (*)    Bicarbonate 18.7 (*)    TCO2 20 (*)    Acid-base deficit 6.0 (*)    Sodium 134 (*)  All other components within normal limits  TROPONIN I (HIGH SENSITIVITY) - Abnormal; Notable for the following components:   Troponin I (High Sensitivity) 45 (*)    All other components within normal limits  TROPONIN I (HIGH SENSITIVITY)    EKG EKG Interpretation  Date/Time:  Wednesday June 02 2022 00:24:34 EDT Ventricular Rate:  92 PR Interval:  128 QRS Duration: 72 QT Interval:  352 QTC Calculation: 435 R Axis:   104 Text Interpretation: Normal sinus rhythm Rightward axis Nonspecific ST and T wave abnormality Abnormal ECG When compared with ECG of 27-Apr-2022 12:20, No significant change was found Confirmed by Delora Fuel ([id number]) on 06/02/2022 1:00:53 AM  Radiology DG Chest Port 1 View  Result Date: 06/02/2022 CLINICAL DATA:  Dyspnea EXAM: PORTABLE CHEST 1 VIEW COMPARISON:  Radiographs 03/22/2022 FINDINGS: Stable linear scarring in the left lateral mid lung. Postoperative changes in the right lung. No pleural effusion or pneumothorax. Stable borderline cardiomegaly. Aortic atherosclerotic calcification. Prominent central pulmonary arteries. Diffuse interstitial coarsening. No acute osseous abnormality. IMPRESSION: 1. No acute cardiopulmonary process. 2. Prominent central pulmonary arteries possibly due to pulmonary hypertension. 3. Chronic interstitial coarsening. Electronically Signed   By: Placido Sou M.D.   On: 06/02/2022 01:03    Procedures .Critical Care  Performed by: Antonietta Breach, PA-C Authorized by: Antonietta Breach,  PA-C   Critical care provider statement:    Critical care time (minutes):  45   Critical care time was exclusive of:  Separately billable procedures and treating other patients   Critical care was necessary to treat or prevent imminent or life-threatening deterioration of the following conditions:  Respiratory failure   Critical care was time spent personally by me on the following activities:  Development of treatment plan with patient or surrogate, discussions with consultants, evaluation of patient's response to treatment, examination of patient, ordering and review of laboratory studies, ordering and review of radiographic studies, ordering and performing treatments and interventions, pulse oximetry, re-evaluation of patient's condition, review of old charts and obtaining history from patient or surrogate   I assumed direction of critical care for this patient from another provider in my specialty: no     Care discussed with: admitting provider     {Document cardiac monitor, telemetry assessment procedure when appropriate:1}  Medications Ordered in ED Medications  furosemide (LASIX) injection 40 mg (40 mg Intravenous Given 06/02/22 0100)  methylPREDNISolone sodium succinate (SOLU-MEDROL) 125 mg/2 mL injection 125 mg (125 mg Intravenous Given 06/02/22 0202)    ED Course/ Medical Decision Making/ A&P Clinical Course as of 06/02/22 0250  Wed Jun 02, 2022  0058 On reassessment, patient with sats of 96% on BiPAP [KH]  0225 Patient currently titrated down to HFNC at 10L/min with sats of 92% [KH]  0249 Case discussed with Dr. Myna Hidalgo who will assess for admission. [KH]    Clinical Course User Index [KH] Antonietta Breach, PA-C                           Medical Decision Making Risk Prescription drug management. Decision regarding hospitalization.   ***  {Document critical care time when appropriate:1} {Document review of labs and clinical decision tools ie heart score, Chads2Vasc2 etc:1}   {Document your independent review of radiology images, and any outside records:1} {Document your discussion with family members, caretakers, and with consultants:1} {Document social determinants of health affecting pt's care:1} {Document your decision making why or why not admission, treatments were needed:1} Final Clinical Impression(s) /  ED Diagnoses Final diagnoses:  Acute on chronic respiratory failure with hypoxia ([place])  NSTEMI (non-ST elevated myocardial infarction) ([place])    Rx / DC Orders ED Discharge Orders     None

## 2022-06-02 NOTE — ED Notes (Signed)
CBG - 219

## 2022-06-02 NOTE — ED Notes (Signed)
Patient is resting in bed, no s/s of distress, family present at bedside, patient awake and alert this morning, will continue to monitor.

## 2022-06-02 NOTE — H&P (Signed)
History and Physical    Brianna Adams IOE:[id number] DOB: [birth year] DOA: 06/02/2022  PCP: [name], [name]   Patient coming from: Home   Chief Complaint: SOB, leg swelling   HPI: Brianna Adams is a pleasant 81 y.o. female with medical history significant for COPD, chronic HFpEF, chronic hypoxic respiratory failure, history of PE on Eliquis, pulmonary hypertension, type 2 diabetes mellitus, and lung cancer status post right lower lobe wedge resection and radiation who presents to the emergency department with worsening shortness of breath and leg swelling.   Patient reports insidiously worsening dyspnea over the past 1 to 2 months.  She was seen by her pulmonologist for this on 05/19/2022 and was instructed to double her Lasix for 3 days and increase her supplemental oxygen to 6 L/min.  She has continued to worsen despite this and now reports new onset of bilateral lower leg swelling.  She denies cough, chest pain, fever, or chills.  She reports that her oxygen saturation was as low as the upper 60s on supplemental oxygen at home.  ED Course: Upon arrival to the ED, patient is found to be afebrile and saturating 79% on 6 L/min with tachypnea and stable blood pressure.  EKG features sinus rhythm and chest x-ray demonstrates prominent pulmonary arteries and chronic interstitial coarsening.  Blood work notable for creatinine 1.36, troponin 45, and BNP of 956.    Patient was given 40 mg IV Lasix and 125 mg IV Solu-Medrol in the ED, was treated with BiPAP briefly, and now on 11 L/min of supplemental oxygen via nasal cannula.  Review of Systems:  All other systems reviewed and apart from HPI, are negative.  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   PE (pulmonary thromboembolism) ([place]) 02/29/2020   Pulmonary hypertension ([place])    Uterine cancer [facility])     Past Surgical History:  Procedure Laterality Date   ABDOMINAL HYSTERECTOMY     cataract surgery  2012 and 2017   COLONOSCOPY     FIBEROPTIC BRONCHOSCOPY     FLEXIBLE SIGMOIDOSCOPY N/A 08/13/2021   Procedure: FLEXIBLE SIGMOIDOSCOPY;  Surgeon: Sharyn Creamer, MD;  Location: [facility];  Service: Gastroenterology;  Laterality: N/A;   GANGLION CYST EXCISION Left 09/11/2020   Procedure: EXCISION VOLAR RADIAL GANGLION OF LEFT WRIST;  Surgeon: Daryll Brod, MD;  Location: [facility];  Service: Orthopedics;  Laterality: Left;  AXILLARY BLOCK   resection of lung cancer  2018   RIGHT HEART CATH N/A 10/20/2021   Procedure: RIGHT HEART CATH;  Surgeon: Leonie Man, MD;  Location: [facility];  Service: Cardiovascular;  Laterality: N/A;   RIGHT HEART CATH N/A 03/18/2022   Procedure: RIGHT HEART CATH;  Surgeon: Larey Dresser, MD;  Location: [facility];  Service: Cardiovascular;  Laterality: N/A;   right lower lobe non-anatomocal lung resection wedge     right thorascoscopy      Social History:   reports that she quit smoking about 6 years ago. Her smoking use included cigarettes. She has a 58.00 pack-year smoking history. She has been exposed to tobacco smoke. She  has never used smokeless tobacco. She reports that she does not drink alcohol and does not use drugs.  Allergies  Allergen Reactions   Ace Inhibitors Swelling   Penicillin G Hives, Shortness Of Breath and Rash   Zestril [Lisinopril] Swelling   Cortisone Other (See Comments)    Unknown reaction   Hydrocortisone Other (See Comments)    Unknown reaction   Lipitor [Atorvastatin] Other (See Comments)    Myalgias (Muscle Pain) in high dosages     Mobic [Meloxicam] Other (See Comments)    Bloody stools   Pravachol [Pravastatin] Other (See  Comments)    Chest Pain if high dosage     Family History  Problem Relation Age of Onset   Hypertension Mother    Heart attack Father    Cancer Maternal Aunt        unknown type   Stomach cancer Neg Hx    Colon cancer Neg Hx    Esophageal cancer Neg Hx    Pancreatic cancer Neg Hx      Prior to Admission medications   Medication Sig Start Date End Date Taking? Authorizing Provider  acetaminophen (TYLENOL) 500 MG tablet Take 1,000 mg by mouth every 6 (six) hours as needed for mild pain or fever. 01/30/17   [provider]  albuterol (PROVENTIL) (2.5 MG/3ML) 0.083% nebulizer solution Take 3 mLs (2.5 mg total) by nebulization every 6 (six) hours as needed for wheezing or shortness of breath. 12/24/21   Cobb, Karie Schwalbe, NP  albuterol (VENTOLIN HFA) 108 (90 Base) MCG/ACT inhaler TAKE 2 PUFFS BY MOUTH EVERY 6 HOURS AS NEEDED FOR WHEEZE OR SHORTNESS OF BREATH 11/16/21   [name], [name]  apixaban (ELIQUIS) 2.5 MG TABS tablet Take 1 tablet (2.5 mg total) by mouth 2 (two) times daily. 04/20/22   Earlie Server, MD  beta carotene w/minerals (OCUVITE) tablet Take 1 tablet by mouth in the morning.    [provider]  Budeson-Glycopyrrol-Formoterol (BREZTRI AEROSPHERE) 160-9-4.8 MCG/ACT AERO Inhale 2 puffs into the lungs in the morning and at bedtime. 02/16/22   Cobb, Karie Schwalbe, NP  CALCIUM PO Take 1,200 mg by mouth in the morning.    [provider]  Cinnamon 500 MG TABS Take 1,000 mg by mouth at bedtime.    [provider]  ferrous sulfate 325 (65 FE) MG EC tablet Take 1 tablet (325 mg total) by mouth daily. 04/22/22   Earlie Server, MD  furosemide (LASIX) 20 MG tablet Take 2 tablets (40 mg total) by mouth daily. Patient taking differently: Take 20 mg by mouth 2 (two) times daily. 03/11/22   Larey Dresser, MD  guaiFENesin (MUCINEX PO) Take by mouth at bedtime.    [provider]  JARDIANCE 10 MG TABS tablet TAKE 1 TABLET BY MOUTH EVERY DAY BEFORE BREAKFAST  05/21/22   [name], [name]  losartan (COZAAR) 25 MG tablet TAKE 1/2 TABLET BY MOUTH EVERY DAY 04/09/22   [name], [name]  metFORMIN (GLUCOPHAGE) 1000 MG tablet TAKE 1 TABLET (1,000 MG TOTAL) BY MOUTH 2 (TWO) TIMES DAILY WITH A MEAL. 01/31/22 01/31/23  [name], [name]  Multiple Vitamins-Minerals (CENTRUM SILVER 50+WOMEN PO) Take 1 tablet by mouth in the morning.    [provider]  omeprazole (PRILOSEC) 40 MG capsule TAKE 1 CAPSULE BY MOUTH EVERY DAY 02/27/22   [name], [name]  pravastatin (PRAVACHOL) 10 MG tablet TAKE 1 TABLET BY MOUTH EVERY DAY 02/27/22   [name], [name]  Spacer/Aero-Holding [facility] Use  with inhaler 12/24/21   Clayton Bibles, NP  vitamin C (ASCORBIC ACID) 500 MG tablet Take 500 mg by mouth every Tuesday, Thursday, Saturday, and Sunday. In the morning.    [provider]  vitamin E 180 MG (400 UNITS) capsule Take 400 Units by mouth every Monday, Wednesday, and Friday. In the morning.    [provider]    Physical Exam: Vitals:   06/02/22 0315 06/02/22 0400 06/02/22 0445 06/02/22 0515  BP: 107/68 100/64 100/69 108/69  Pulse: 85 84 83 86  Resp: (!) _0 (!) 26  Temp:      SpO2: 91% 92% 93% 93%    Constitutional: NAD, calm  Eyes: PERTLA, lids and conjunctivae normal ENMT: Mucous membranes are moist. Posterior pharynx clear of any exudate or lesions.   Neck: supple, no masses  Respiratory: no wheezing, no crackles. No accessory muscle use.  Cardiovascular: S1 & S2 heard, regular rate and rhythm. B/l ankle edema and JVD. Abdomen: No distension, no tenderness, soft. Bowel sounds active.  Musculoskeletal: no clubbing / cyanosis. No joint deformity upper and lower extremities.   Skin: no significant rashes, lesions, ulcers. Warm, dry, well-perfused. Neurologic: CN 2-12 grossly intact. Moving all extremities. Alert and oriented.  Psychiatric: Pleasant. Cooperative.    Labs and Imaging on Admission: I have personally reviewed  following labs and imaging studies  CBC: Recent Labs  Lab 06/02/22 0045 06/02/22 0057 06/02/22 0352  WBC 6.3  --  5.6  NEUTROABS 3.5  --   --   HGB 13.7 13.9 13.0  HCT 43.7 41.0 40.3  MCV 91.8  --  88.2  PLT 260  --  850   Basic Metabolic Panel: Recent Labs  Lab 06/02/22 0045 06/02/22 0057 06/02/22 0352  NA 137 134* 136  K 5.1 4.1 4.8  CL 104  --  104  CO2 19*  --  21*  GLUCOSE 203*  --  170*  BUN 25*  --  25*  CREATININE 1.36*  --  1.21*  CALCIUM 9.7  --  9.5  MG  --   --  1.7   GFR: Estimated Creatinine Clearance: 28.4 mL/min (A) (by C-G formula based on SCr of 1.21 mg/dL (H)). Liver Function Tests: Recent Labs  Lab 06/02/22 0045  AST 50*  ALT 69*  ALKPHOS 56  BILITOT 0.7  PROT 7.3  ALBUMIN 3.8   No results for input(s): "LIPASE", "AMYLASE" in the last 168 hours. No results for input(s): "AMMONIA" in the last 168 hours. Coagulation Profile: No results for input(s): "INR", "PROTIME" in the last 168 hours. Cardiac Enzymes: No results for input(s): "CKTOTAL", "CKMB", "CKMBINDEX", "TROPONINI" in the last 168 hours. BNP (last 3 results) No results for input(s): "PROBNP" in the last 8760 hours. HbA1C: No results for input(s): "HGBA1C" in the last 72 hours. CBG: No results for input(s): "GLUCAP" in the last 168 hours. Lipid Profile: No results for input(s): "CHOL", "HDL", "LDLCALC", "TRIG", "CHOLHDL", "LDLDIRECT" in the last 72 hours. Thyroid Function Tests: No results for input(s): "TSH", "T4TOTAL", "FREET4", "T3FREE", "THYROIDAB" in the last 72 hours. Anemia Panel: No results for input(s): "VITAMINB12", "FOLATE", "FERRITIN", "TIBC", "IRON", "RETICCTPCT" in the last 72 hours. Urine analysis:    Component Value Date/Time   BILIRUBINUR negative 10/09/2020 1205   PROTEINUR Negative 10/09/2020 1205   UROBILINOGEN 0.2 10/09/2020 1205   NITRITE negative 10/09/2020 1205   LEUKOCYTESUR Negative 10/09/2020 1205   Sepsis  Labs: _1 (procalcitonin:4,lacticidven:4) )No results found for this or any previous visit (  from the past 240 hour(s)).   Radiological Exams on Admission: DG Chest Port 1 View  Result Date: 06/02/2022 CLINICAL DATA:  Dyspnea EXAM: PORTABLE CHEST 1 VIEW COMPARISON:  Radiographs 03/22/2022 FINDINGS: Stable linear scarring in the left lateral mid lung. Postoperative changes in the right lung. No pleural effusion or pneumothorax. Stable borderline cardiomegaly. Aortic atherosclerotic calcification. Prominent central pulmonary arteries. Diffuse interstitial coarsening. No acute osseous abnormality. IMPRESSION: 1. No acute cardiopulmonary process. 2. Prominent central pulmonary arteries possibly due to pulmonary hypertension. 3. Chronic interstitial coarsening. Electronically Signed   By: Placido Sou M.D.   On: 06/02/2022 01:03    EKG: Independently reviewed. Sinus rhythm, RAD.   Assessment/Plan   1. Acute on chronic HFpEF; acute on chronic hypoxic respiratory failure  - Presents with progressive SOB and leg swelling despite recently doubling Lasix and is found to have increased BNP and 11 Lpm oxygen requirement (up from 4-6 Lpm)  - Given IV Lasix in ED  - Continue diuresis with 40 mg IV Lasix q12h, hold losartan and Jardiance in light of AKI, monitor wt and I/Os, monitor renal function and electrolytes    2. 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 COPD  - No cough or wheezing on admission  - Continue ICS-LAMA-LABA and as-needed albuterol    4. Type II DM  - A1c was 6.7% in May 2023  - Check CBGs and use low-intensity SSI for now   5. Hx of PE  - Continue Eliquis   DVT prophylaxis: Eliquis  Code Status: Full  Level of Care: Level of care: Progressive Family Communication: Daughter at bedside  Disposition Plan:  Patient is from:  home  Anticipated d/c is to: TBD Anticipated d/c date is: 06/04/22  Patient currently: Pending improved respiratory status, stable renal function   Consults called: None  Admission status: Inpatient     Vianne Bulls, MD [facility]  06/02/2022, 5:38 AM

## 2022-06-02 NOTE — ED Provider Notes (Incomplete)
[facility]ENC[facility] DEPARTMENT Provider Note   CSN: [id number] Arrival date & time: 06/02/22  0011     History {Add pertinent medical, surgical, social history, OB history to HPI:1} Chief Complaint  Patient presents with  . Shortness of Breath    Brianna Adams is a 81 y.o. female.  81 y/o female with hx of PAH (on chronic 4L via [place]), PE (on chronic Eliquis), CHF (LVEF 60-65%), COPD, prior lung CA s/p lobectomy presents to the ED for SOB. Symptoms have been constant and worsening x 1.5 months. Patient usually with O2 sats of 88-92% on 4L via [place]. Was told by her pulmonologist to increase O2 to 6L when active following office evaluation 10 days ago. At this time, was also trialed on an increase of her Lasix x3 days without any significant change to her SOB symptoms. Daughter reports O2 sats have been in the 70's for the past week or so; got down to 69% at home tonight. Daughter feels that legs are more swollen with increased periorbital "puffiness". Patient denies fever, cough, chest pain, orthopnea, hemoptysis, syncope.   Shortness of Breath Associated symptoms: no cough and no fever        Home Medications Prior to Admission medications   Medication Sig Start Date End Date Taking? Authorizing Provider  acetaminophen (TYLENOL) 500 MG tablet Take 1,000 mg by mouth every 6 (six) hours as needed for mild pain or fever. 01/30/17   [provider]  albuterol (PROVENTIL) (2.5 MG/3ML) 0.083% nebulizer solution Take 3 mLs (2.5 mg total) by nebulization every 6 (six) hours as needed for wheezing or shortness of breath. 12/24/21   Cobb, Karie Schwalbe, NP  albuterol (VENTOLIN HFA) 108 (90 Base) MCG/ACT inhaler TAKE 2 PUFFS BY MOUTH EVERY 6 HOURS AS NEEDED FOR WHEEZE OR SHORTNESS OF BREATH 11/16/21   [name], [name]  apixaban (ELIQUIS) 2.5 MG TABS tablet Take 1 tablet (2.5 mg total) by mouth 2 (two) times daily. 04/20/22   Earlie Server, MD  beta carotene w/minerals (OCUVITE)  tablet Take 1 tablet by mouth in the morning.    [provider]  Budeson-Glycopyrrol-Formoterol (BREZTRI AEROSPHERE) 160-9-4.8 MCG/ACT AERO Inhale 2 puffs into the lungs in the morning and at bedtime. 02/16/22   Cobb, Karie Schwalbe, NP  CALCIUM PO Take 1,200 mg by mouth in the morning.    [provider]  Cinnamon 500 MG TABS Take 1,000 mg by mouth at bedtime.    [provider]  ferrous sulfate 325 (65 FE) MG EC tablet Take 1 tablet (325 mg total) by mouth daily. 04/22/22   Earlie Server, MD  furosemide (LASIX) 20 MG tablet Take 2 tablets (40 mg total) by mouth daily. Patient taking differently: Take 20 mg by mouth 2 (two) times daily. 03/11/22   Larey Dresser, MD  guaiFENesin (MUCINEX PO) Take by mouth at bedtime.    [provider]  JARDIANCE 10 MG TABS tablet TAKE 1 TABLET BY MOUTH EVERY DAY BEFORE BREAKFAST 05/21/22   [name], [name]  losartan (COZAAR) 25 MG tablet TAKE 1/2 TABLET BY MOUTH EVERY DAY 04/09/22   [name], [name]  metFORMIN (GLUCOPHAGE) 1000 MG tablet TAKE 1 TABLET (1,000 MG TOTAL) BY MOUTH 2 (TWO) TIMES DAILY WITH A MEAL. 01/31/22 01/31/23  [name], [name]  Multiple Vitamins-Minerals (CENTRUM SILVER 50+WOMEN PO) Take 1 tablet by mouth in the morning.    [provider]  omeprazole (PRILOSEC) 40 MG capsule TAKE 1 CAPSULE BY MOUTH EVERY DAY  02/27/22   [name], [name]  pravastatin (PRAVACHOL) 10 MG tablet TAKE 1 TABLET BY MOUTH EVERY DAY 02/27/22   [name], [name]  Spacer/Aero-Holding [facility] Use with inhaler 12/24/21   Belenda Cruise, Karie Schwalbe, NP  vitamin C (ASCORBIC ACID) 500 MG tablet Take 500 mg by mouth every Tuesday, Thursday, Saturday, and Sunday. In the morning.    [provider]  vitamin E 180 MG (400 UNITS) capsule Take 400 Units by mouth every Monday, Wednesday, and Friday. In the morning.    [provider]      Allergies    Ace inhibitors, Penicillin g, Zestril [lisinopril], Cortisone, Hydrocortisone,  Lipitor [atorvastatin], Mobic [meloxicam], and Pravachol [pravastatin]    Review of Systems   Review of Systems  Constitutional:  Negative for fever.  Respiratory:  Positive for shortness of breath. Negative for cough.   Ten systems reviewed and are negative for acute change, except as noted in the HPI.    Physical Exam Updated Vital Signs BP 105/72   Pulse 81   Temp 97.9 F (36.6 C)   Resp 20   SpO2 90%   Physical Exam Vitals and nursing note reviewed.  Constitutional:      General: She is not in acute distress.    Appearance: She is well-developed. She is not diaphoretic.     Comments: Nontoxic appearing AA female.  HENT:     Head: Normocephalic and atraumatic.  Eyes:     General: No scleral icterus.    Conjunctiva/sclera: Conjunctivae normal.  Pulmonary:     Effort: Pulmonary effort is normal. No respiratory distress.     Comments: No overt wheezing or rales. Dyspneic and tachypneic. Patient speaking in truncated sentences, but in not acute distress. SpO2 74% on 6L via [place]; transitioned to 15L NRB. Musculoskeletal:        General: Normal range of motion.     Cervical back: Normal range of motion.     Comments: 1+ pitting edema BLE  Skin:    General: Skin is warm and dry.     Coloration: Skin is not pale.     Findings: No erythema or rash.  Neurological:     Mental Status: She is alert and oriented to person, place, and time.     Coordination: Coordination normal.  Psychiatric:        Behavior: Behavior normal.     ED Results / Procedures / Treatments   Labs (all labs ordered are listed, but only abnormal results are displayed) Labs Reviewed  CBC WITH DIFFERENTIAL/PLATELET - Abnormal; Notable for the following components:      Result Value   RDW 18.3 (*)    All other components within normal limits  COMPREHENSIVE METABOLIC PANEL - Abnormal; Notable for the following components:   CO2 19 (*)    Glucose, Bld 203 (*)    BUN 25 (*)    Creatinine, Ser 1.36 (*)     AST 50 (*)    ALT 69 (*)    GFR, Estimated 39 (*)    All other components within normal limits  BRAIN NATRIURETIC PEPTIDE - Abnormal; Notable for the following components:   B Natriuretic Peptide 955.7 (*)    All other components within normal limits  I-STAT ARTERIAL BLOOD GAS, ED - Abnormal; Notable for the following components:   pCO2 arterial 31.9 (*)    Bicarbonate 18.7 (*)    TCO2 20 (*)    Acid-base deficit 6.0 (*)    Sodium 134 (*)  All other components within normal limits  TROPONIN I (HIGH SENSITIVITY) - Abnormal; Notable for the following components:   Troponin I (High Sensitivity) 45 (*)    All other components within normal limits  TROPONIN I (HIGH SENSITIVITY)    EKG EKG Interpretation  Date/Time:  Wednesday June 02 2022 00:24:34 EDT Ventricular Rate:  92 PR Interval:  128 QRS Duration: 72 QT Interval:  352 QTC Calculation: 435 R Axis:   104 Text Interpretation: Normal sinus rhythm Rightward axis Nonspecific ST and T wave abnormality Abnormal ECG When compared with ECG of 27-Apr-2022 12:20, No significant change was found Confirmed by Delora Fuel ([id number]) on 06/02/2022 1:00:53 AM  Radiology DG Chest Port 1 View  Result Date: 06/02/2022 CLINICAL DATA:  Dyspnea EXAM: PORTABLE CHEST 1 VIEW COMPARISON:  Radiographs 03/22/2022 FINDINGS: Stable linear scarring in the left lateral mid lung. Postoperative changes in the right lung. No pleural effusion or pneumothorax. Stable borderline cardiomegaly. Aortic atherosclerotic calcification. Prominent central pulmonary arteries. Diffuse interstitial coarsening. No acute osseous abnormality. IMPRESSION: 1. No acute cardiopulmonary process. 2. Prominent central pulmonary arteries possibly due to pulmonary hypertension. 3. Chronic interstitial coarsening. Electronically Signed   By: Placido Sou M.D.   On: 06/02/2022 01:03    Procedures .Critical Care  Performed by: Antonietta Breach, PA-C Authorized by: Antonietta Breach,  PA-C   Critical care provider statement:    Critical care time (minutes):  45   Critical care time was exclusive of:  Separately billable procedures and treating other patients   Critical care was necessary to treat or prevent imminent or life-threatening deterioration of the following conditions:  Respiratory failure   Critical care was time spent personally by me on the following activities:  Development of treatment plan with patient or surrogate, discussions with consultants, evaluation of patient's response to treatment, examination of patient, ordering and review of laboratory studies, ordering and review of radiographic studies, ordering and performing treatments and interventions, pulse oximetry, re-evaluation of patient's condition, review of old charts and obtaining history from patient or surrogate   I assumed direction of critical care for this patient from another provider in my specialty: no     Care discussed with: admitting provider     {Document cardiac monitor, telemetry assessment procedure when appropriate:1}  Medications Ordered in ED Medications  furosemide (LASIX) injection 40 mg (40 mg Intravenous Given 06/02/22 0100)  methylPREDNISolone sodium succinate (SOLU-MEDROL) 125 mg/2 mL injection 125 mg (125 mg Intravenous Given 06/02/22 0202)    ED Course/ Medical Decision Making/ A&P Clinical Course as of 06/02/22 0250  Wed Jun 02, 2022  0058 On reassessment, patient with sats of 96% on BiPAP [KH]  0225 Patient currently titrated down to HFNC at 10L/min with sats of 92% [KH]  0249 Case discussed with Dr. Myna Hidalgo who will assess for admission. [KH]    Clinical Course User Index [KH] Antonietta Breach, PA-C                           Medical Decision Making Risk Prescription drug management. Decision regarding hospitalization.   This patient presents to the ED for concern of ***, this involves an extensive number of treatment options, and is a complaint that carries with it a  high risk of complications and morbidity.  The differential diagnosis includes ***   Co morbidities that complicate the patient evaluation  ***   Additional history obtained:  Additional history obtained from *** External records from outside source  obtained and reviewed including ***   Lab Tests:  I Ordered, and personally interpreted labs.  The pertinent results include:  ***   Imaging Studies ordered:  I ordered imaging studies including ***  I independently visualized and interpreted imaging which showed *** I agree with the radiologist interpretation   Cardiac Monitoring:  The patient was maintained on a cardiac monitor.  I personally viewed and interpreted the cardiac monitored which showed an underlying rhythm of: ***   Medicines ordered and prescription drug management:  I ordered medication including ***  for ***  Reevaluation of the patient after these medicines showed that the patient {resolved/improved/worsened:23923::"improved"} I have reviewed the patients home medicines and have made adjustments as needed   Test Considered:  ***   Critical Interventions:  ***   Consultations Obtained:  I requested consultation with the ***,  and discussed lab and imaging findings as well as pertinent plan - they recommend: ***   Problem List / ED Course:  ***   Reevaluation:  After the interventions noted above, I reevaluated the patient and found that they have :{resolved/improved/worsened:23923::"improved"}   Social Determinants of Health:  ***   Dispostion:  After consideration of the diagnostic results and the patients response to treatment, I feel that the patent would benefit from ***.    {Document critical care time when appropriate:1} {Document review of labs and clinical decision tools ie heart score, Chads2Vasc2 etc:1}  {Document your independent review of radiology images, and any outside records:1} {Document your discussion with  family members, caretakers, and with consultants:1} {Document social determinants of health affecting pt's care:1} {Document your decision making why or why not admission, treatments were needed:1} Final Clinical Impression(s) / ED Diagnoses Final diagnoses:  Acute on chronic respiratory failure with hypoxia ([place])  NSTEMI (non-ST elevated myocardial infarction) ([place])    Rx / DC Orders ED Discharge Orders     None

## 2022-06-02 NOTE — Progress Notes (Signed)
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  Presented to the ED with worsening dyspnea on exertion, O2 sats in the 70s recently, required BiPAP on admission now weaned down to 11 L high flow, labs noted creatinine of 1.3, BNP 955, chest x-ray with prominent central pulmonary arteries and interstitial coarsening   Subjective: -Breathing starting to improve  Assessment and Plan:  Acute on chronic diastolic CHF, RV failure Severe PAH -Last echo 2/23 with EF 60-65%, moderate RV dysfunction, PASP of 90 mmHg -Will request heart failure team input -Continue IV Lasix, Jardiance  Acute hypoxic respiratory failure COPD/advanced emphysema -Has underlying advanced emphysema and postradiation lung scarring -History of PE on Eliquis, with compliance -With a component of CHF exacerbation as well, as above -On 4 to 6 L O2 at baseline recently -Add DuoNebs, incentive spirometry  History of pulmonary embolism -Continue Eliquis  Type 2 diabetes mellitus -A1c was 6.7 in 5/23, continue Jardiance  AKI on CKD 3a -Creatinine slightly higher than baseline, monitor with diuresis   DVT prophylaxis: Apixaban Code Status: Full code would Family Communication: Discussed with patient and daughter at bedside Disposition Plan: Home pending improvement in respiratory status  Consultants: Heart failure team   Procedures:   Antimicrobials:    Objective: Vitals:   06/02/22 0745 06/02/22 0829 06/02/22 1130 06/02/22 1146  BP: 120/75 113/71 (!) 100/58   Pulse: 86 84 80   Resp: (!) 25 (!) 28 19   Temp:  97.7 F (36.5 C)  97.6 F (36.4 C)  TempSrc:  Oral    SpO2: (!) 89% 93% 92%     Intake/Output Summary  (Last 24 hours) at 06/02/2022 1300 Last data filed at 06/02/2022 1129 Gross per 24 hour  Intake --  Output 650 ml  Net -650 ml   There were no vitals filed for this visit.  Examination:  General exam: Pleasant elderly female sitting up in bed, AAOx3, no distress HEENT: Positive JVD CVS: Few basilar Rales otherwise clear Abdomen: Soft, nontender, bowel sounds present Extremities: Trace edema Skin: No rashes Psychiatry:  Mood & affect appropriate.     Data Reviewed:   CBC: Recent Labs  Lab 06/02/22 0045 06/02/22 0057 06/02/22 0352  WBC 6.3  --  5.6  NEUTROABS 3.5  --   --   HGB 13.7 13.9 13.0  HCT 43.7 41.0 40.3  MCV 91.8  --  88.2  PLT 260  --  245   Basic Metabolic Panel: Recent Labs  Lab 06/02/22 0045 06/02/22 0057 06/02/22 0352  NA 137 134* 136  K 5.1 4.1 4.8  CL 104  --  104  CO2 19*  --  21*  GLUCOSE 203*  --  170*  BUN 25*  --  25*  CREATININE 1.36*  --  1.21*  CALCIUM 9.7  --  9.5  MG  --   --  1.7   GFR: CrCl cannot be calculated (Unknown ideal weight.). Liver Function Tests: Recent Labs  Lab 06/02/22 0045  AST 50*  ALT 69*  ALKPHOS 56  BILITOT 0.7  PROT 7.3  ALBUMIN 3.8   No results for input(s): "LIPASE", "AMYLASE" in the last 168 hours. No results for input(s): "AMMONIA" in the last 168  hours. Coagulation Profile: No results for input(s): "INR", "PROTIME" in the last 168 hours. Cardiac Enzymes: No results for input(s): "CKTOTAL", "CKMB", "CKMBINDEX", "TROPONINI" in the last 168 hours. BNP (last 3 results) No results for input(s): "PROBNP" in the last 8760 hours. HbA1C: No results for input(s): "HGBA1C" in the last 72 hours. CBG: Recent Labs  Lab 06/02/22 0809 06/02/22 1141  GLUCAP 219* 192*   Lipid Profile: No results for input(s): "CHOL", "HDL", "LDLCALC", "TRIG", "CHOLHDL", "LDLDIRECT" in the last 72 hours. Thyroid Function Tests: No results for input(s): "TSH", "T4TOTAL", "FREET4", "T3FREE", "THYROIDAB" in the last 72  hours. Anemia Panel: No results for input(s): "VITAMINB12", "FOLATE", "FERRITIN", "TIBC", "IRON", "RETICCTPCT" in the last 72 hours. Urine analysis:    Component Value Date/Time   BILIRUBINUR negative 10/09/2020 1205   PROTEINUR Negative 10/09/2020 1205   UROBILINOGEN 0.2 10/09/2020 1205   NITRITE negative 10/09/2020 1205   LEUKOCYTESUR Negative 10/09/2020 1205   Sepsis Labs: _0 (procalcitonin:4,lacticidven:4)  )No results found for this or any previous visit (from the past 240 hour(s)).   Radiology Studies: DG Chest Port 1 View  Result Date: 06/02/2022 CLINICAL DATA:  Dyspnea EXAM: PORTABLE CHEST 1 VIEW COMPARISON:  Radiographs 03/22/2022 FINDINGS: Stable linear scarring in the left lateral mid lung. Postoperative changes in the right lung. No pleural effusion or pneumothorax. Stable borderline cardiomegaly. Aortic atherosclerotic calcification. Prominent central pulmonary arteries. Diffuse interstitial coarsening. No acute osseous abnormality. IMPRESSION: 1. No acute cardiopulmonary process. 2. Prominent central pulmonary arteries possibly due to pulmonary hypertension. 3. Chronic interstitial coarsening. Electronically Signed   By: Placido Sou M.D.   On: 06/02/2022 01:03     Scheduled Meds:  apixaban  2.5 mg Oral BID   empagliflozin  10 mg Oral Daily   fluticasone furoate-vilanterol  1 puff Inhalation Daily   furosemide  80 mg Intravenous NOW   furosemide  80 mg Intravenous BID   insulin aspart  0-5 Units Subcutaneous QHS   insulin aspart  0-9 Units Subcutaneous TID WC   pantoprazole  40 mg Oral Daily   pravastatin  10 mg Oral Daily   sodium chloride flush  3 mL Intravenous Q12H   umeclidinium bromide  1 puff Inhalation Daily   Continuous Infusions:   LOS: 0 days    Time spent: 67mn  PDomenic Polite MD [facility]   06/02/2022, 1:00 PM

## 2022-06-02 NOTE — Telephone Encounter (Signed)
Patient currently at ED.  Will close encounter

## 2022-06-02 NOTE — Progress Notes (Signed)
Heart Failure Navigator Progress Note  Assessed for Heart & Vascular TOC clinic readiness.  Patient does not meet criteria due to Advanced Heart Failure Team, Dr. [name].     Earnestine Leys, BSN, Clinical cytogeneticist Only

## 2022-06-02 NOTE — Telephone Encounter (Signed)
Dr. [name], please advise. Thanks

## 2022-06-02 NOTE — Consult Note (Addendum)
Advanced Heart Failure Team Consult Note   Primary Physician: [name], [name] PCP-Cardiologist:  Dr. 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 Myna Hidalgo with [facility]. 81 y.o. with history of COPD/emphysema, prior PE in 6/21, lung cancer, and pulmonary hypertension. Patient had right lower lobe lung resection for cancer in 2018 then radiation on the left side in 2019.  CT chest in 5/23 showed advanced emphysema with radiation scarring left upper lobe. She is on home oxygen 4L [place].    Patient was admitted in 4/22 with CHF. Echo at that time showed EF 55-60%, moderate RV enlargement with PASP 85 mmHg.  Repeat echo 2/23 showed EF 60-65%, mild LVH, D-shaped septum, moderate RV dysfunction and moderate RV enlargement, PASP 90 mmHg, mild-moderate TR.  Subsequently RHC was done in 2/23 showing normal RA pressure but significantly elevated PCWP and severe pulmonary hypertension.  This appeared to be mixed pulmonary venous/pulmonary arterial hypertension.    [place] 7/23 showed normal filling pressures and severe pulmonary arterial hypertension, PVR 10 WU.   V/Q scan negative for PE. Awaiting PFTs. Serologic workup with + ANA and + anti-RNP. She was referred to Rheumatology in August.  Lasix increased for several days at f/u 08/22 d/t volume overload.  She saw her pulmonologist about 10 days ago and home O2 increased from 4 to 6L with activity. Diuretic increased for several days at that time as well.  Presented to the ED early this am with complaints of worsening dyspnea. O2 sats as low as 70s over the last week. On presentation she required BiPAP, which was weaned down to 11L HFNC. She was given 40 mg lasix IV and IV steroids. Labs significant for Scr 1.36 (baseline 1.0), CO2 19, K 5.1, AST  50/ALT 69, BNP 955 (528 3 weeks ago), HS troponin 45>43. CXR with prominent central pulmonary arteries and chronic interstitial coarsening, no acute process. She was admitted for acute on chronic systolic CHF with hypoxia.   Has put out 650 cc urine since IV lasix at 1 am. Reports dyspnea improving and more comfortable, but appears to be using accessory muscles to breathe. + orthopnea. Notes lower extremity edema at home.  Review of Systems: [y] = yes, _0  = no   General: Weight gain _1 ; Weight loss _2 ; Anorexia _3 ; Fatigue [Y]; Fever _4 ; Chills _5 ; Weakness _6   Cardiac: Chest pain/pressure _7 ; Resting SOB [Y]; Exertional SOB [Y]; Orthopnea [Y]; Pedal Edema [Y]; Palpitations _8 ; Syncope _9 ; Presyncope _10 ; Paroxysmal nocturnal dyspnea_11   Pulmonary: Cough _12 ; Wheezing_13 ; Hemoptysis_14 ; Sputum _15 ; Snoring _16   GI: Vomiting_17 ; Dysphagia_18 ; Melena_19 ; Hematochezia _20 ; Heartburn_21 ; Abdominal pain _22 ; Constipation _23 ; Diarrhea _24 ; BRBPR _25   GU: Hematuria_26 ; Dysuria _27 ; Nocturia_28   Vascular: Pain in legs with walking _29 ; Pain in feet with lying flat _30 ; Non-healing sores _31 ; Stroke _32 ; TIA _33 ; Slurred speech _34 ;  Neuro: Headaches_35 ; Vertigo_36 ; Seizures_37 ; Paresthesias_38 ;Blurred vision _39 ; Diplopia _40 ; Vision changes _41   Ortho/Skin: Arthritis _42 ; Joint pain _43 ; Muscle pain _44 ; Joint swelling _45 ; Back Pain _46 ; Rash _47   Psych: Depression_0 ; Anxiety_1   Heme: Bleeding problems _2 ; Clotting disorders _3 ; Anemia _4   Endocrine: Diabetes [Y]; Thyroid dysfunction_5   Home Medications Prior to Admission medications   Medication Sig Start Date End Date Taking? Authorizing Provider  acetaminophen (TYLENOL) 500 MG tablet Take 1,000 mg by mouth every 6 (six) hours as needed for mild pain or fever. 01/30/17   [provider]  albuterol (PROVENTIL) (2.5 MG/3ML) 0.083% nebulizer solution Take 3 mLs (2.5 mg total) by nebulization every 6 (six) hours as needed for  wheezing or shortness of breath. 12/24/21   Cobb, Karie Schwalbe, NP  albuterol (VENTOLIN HFA) 108 (90 Base) MCG/ACT inhaler TAKE 2 PUFFS BY MOUTH EVERY 6 HOURS AS NEEDED FOR WHEEZE OR SHORTNESS OF BREATH 11/16/21   [name], [name]  apixaban (ELIQUIS) 2.5 MG TABS tablet Take 1 tablet (2.5 mg total) by mouth 2 (two) times daily. 04/20/22   Earlie Server, MD  beta carotene w/minerals (OCUVITE) tablet Take 1 tablet by mouth in the morning.    [provider]  Budeson-Glycopyrrol-Formoterol (BREZTRI AEROSPHERE) 160-9-4.8 MCG/ACT AERO Inhale 2 puffs into the lungs in the morning and at bedtime. 02/16/22   Cobb, Karie Schwalbe, NP  CALCIUM PO Take 1,200 mg by mouth in the morning.    [provider]  Cinnamon 500 MG TABS Take 1,000 mg by mouth at bedtime.    [provider]  ferrous sulfate 325 (65 FE) MG EC tablet Take 1 tablet (325 mg total) by mouth daily. 04/22/22   Earlie Server, MD  furosemide (LASIX) 20 MG tablet Take 2 tablets (40 mg total) by mouth daily. Patient taking differently: Take 20 mg by mouth 2 (two) times daily. 03/11/22   Larey Dresser, MD  guaiFENesin (MUCINEX PO) Take by mouth at bedtime.    [provider]  JARDIANCE 10 MG TABS tablet TAKE 1 TABLET BY MOUTH EVERY DAY BEFORE BREAKFAST 05/21/22   [name], [name]  losartan (COZAAR) 25 MG tablet TAKE 1/2 TABLET BY MOUTH EVERY DAY 04/09/22   [name], [name]  metFORMIN (GLUCOPHAGE) 1000 MG tablet TAKE 1 TABLET (1,000 MG TOTAL) BY MOUTH 2 (TWO) TIMES DAILY WITH A MEAL. 01/31/22 01/31/23  [name], [name]  Multiple Vitamins-Minerals (CENTRUM SILVER 50+WOMEN PO) Take 1 tablet by mouth in the morning.    [provider]  omeprazole (PRILOSEC) 40 MG capsule TAKE 1 CAPSULE BY MOUTH EVERY DAY 02/27/22   [name], [name]  pravastatin (PRAVACHOL) 10 MG tablet TAKE 1 TABLET BY MOUTH EVERY DAY 02/27/22   [name], [name]  Spacer/Aero-Holding Chambers DEVI Use with inhaler 12/24/21   Belenda Cruise, Karie Schwalbe, NP  vitamin C  (ASCORBIC ACID) 500 MG tablet Take 500 mg by mouth every Tuesday, Thursday, Saturday, and Sunday. In the morning.    [provider]  vitamin E 180 MG (400 UNITS) capsule Take 400 Units by mouth every Monday, Wednesday, and Friday. In the morning.    [provider]    Past Medical History: Past Medical History:  Diagnosis Date   CHF (congestive heart failure) (HCC)    COPD (chronic obstructive pulmonary disease) ([place])    Diabetes ([place])    type 2   Diverticulitis    Dyspnea    On Oxygen 3L via [place]    GERD (gastroesophageal reflux disease)    High cholesterol    Lung cancer (HCC)    Mild non proliferative diabetic retinopathy ([place]) 08/05/2021   PE (pulmonary thromboembolism) ([place])  02/29/2020   Pulmonary hypertension ([place])    Uterine cancer [facility])     Past Surgical History: Past Surgical History:  Procedure Laterality Date   ABDOMINAL HYSTERECTOMY     cataract surgery  2012 and 2017   COLONOSCOPY     FIBEROPTIC BRONCHOSCOPY     FLEXIBLE SIGMOIDOSCOPY N/A 08/13/2021   Procedure: FLEXIBLE SIGMOIDOSCOPY;  Surgeon: Sharyn Creamer, MD;  Location: [facility];  Service: Gastroenterology;  Laterality: N/A;   GANGLION CYST EXCISION Left 09/11/2020   Procedure: EXCISION VOLAR RADIAL GANGLION OF LEFT WRIST;  Surgeon: Daryll Brod, MD;  Location: [facility];  Service: Orthopedics;  Laterality: Left;  AXILLARY BLOCK   resection of lung cancer  2018   RIGHT HEART CATH N/A 10/20/2021   Procedure: RIGHT HEART CATH;  Surgeon: Leonie Man, MD;  Location: [facility];  Service: Cardiovascular;  Laterality: N/A;   RIGHT HEART CATH N/A 03/18/2022   Procedure: RIGHT HEART CATH;  Surgeon: Larey Dresser, MD;  Location: [facility];  Service: Cardiovascular;  Laterality: N/A;   right lower lobe non-anatomocal lung resection wedge     right thorascoscopy      Family History: Family History  Problem Relation Age of Onset   Hypertension Mother    Heart attack Father     Cancer Maternal Aunt        unknown type   Stomach cancer Neg Hx    Colon cancer Neg Hx    Esophageal cancer Neg Hx    Pancreatic cancer Neg Hx     Social History: 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r-sr Pharmacist, hospital  Tobacco Use   Smoking status: Former    Packs/day: 1.00    Years: 58.00    Total pack years: 58.00    Types: Cigarettes    Quit date: 10/22/2015    Years since quitting: 6.6    Passive exposure: Past   Smokeless tobacco: Never   Tobacco comments:    Insurance account manager Use   Vaping Use: Never used  Substance and Sexual Activity   Alcohol use: Never   Drug use: Never   Sexual activity: Not Currently    Birth control/protection: Surgical    Comment: HYSTERECTOMY  Other Topics Concern   Not on file  Social History Narrative   Not on file   Social Determinants of Health   Financial Resource Strain: Low Risk  (10/21/2021)   Overall Financial Resource Strain (CARDIA)    Difficulty of Paying Living Expenses: Not hard at all  Food Insecurity: No Food Insecurity (10/21/2021)   Hunger Vital Sign    Worried About Running Out of Food in the Last Year: Never true    [place] in the Last Year: Never true  Transportation Needs: No Transportation Needs (10/21/2021)   PRAPARE - Hydrologist (Medical): No    Lack of Transportation (Non-Medical): No  Physical Activity: Insufficiently Active (10/21/2021)   Exercise Vital Sign    Days of Exercise per Week: 3 days    Minutes of Exercise per Session: 40 min  Stress: No Stress Concern Present (10/21/2021)   [employer] - Occupational Stress Questionnaire    Feeling of Stress : Not at all  Social  Connections: Not on file    Allergies:  Allergies  Allergen Reactions   Ace Inhibitors Swelling   Penicillin G Hives, Shortness Of Breath and Rash   Zestril [Lisinopril] Swelling   Cortisone Other (See Comments)    Unknown reaction   Hydrocortisone Other (See Comments)    Unknown reaction   Lipitor [Atorvastatin] Other (See Comments)    Myalgias (Muscle Pain) in high dosages     Mobic [Meloxicam] Other (See Comments)    Bloody stools   Pravachol [Pravastatin] Other (See Comments)    Chest Pain if high dosage     Objective:    Vital Signs:   Temp:  [97.7 F (36.5 C)-97.9 F (36.6 C)] 97.7 F (36.5 C) (09/27 0829) Pulse Rate:  [80-95] 84 (09/27 0829) Resp:  [13-39] 28 (09/27 0829) BP: (100-120)/(64-75) 113/71 (09/27 0829) SpO2:  [79 %-99 %] 93 % (09/27 0829)    Weight change: There were no vitals filed for this visit.  Intake/Output:  No intake or output data in the 24 hours ending 06/02/22 1043    Physical Exam    General:  Elderly female, appears to be in mild to moderate respiratory distress on 11L HFNC HEENT: normal Neck: supple. JVP to jaw . Carotids 2+ bilat; no bruits.  Cor: PMI nondisplaced. Regular rate & rhythm. No rubs, gallops or murmurs. Lungs: diminished Abdomen: soft, nontender, mildly distended. Using accessory muscles Extremities: no cyanosis, clubbing, rash, edema Neuro: alert & orientedx3, cranial nerves grossly intact. moves all 4 extremities w/o difficulty. Affect pleasant   Telemetry   SR 80s  EKG    SR 92 bpm  Labs   Basic Metabolic Panel: Recent Labs  Lab 06/02/22 0045 06/02/22 0057 06/02/22 0352  NA 137 134* 136  K 5.1 4.1 4.8  CL 104  --  104  CO2 19*  --  21*  GLUCOSE 203*  --  170*  BUN 25*  --  25*  CREATININE 1.36*  --  1.21*  CALCIUM 9.7  --  9.5  MG  --   --  1.7    Liver Function Tests: Recent Labs  Lab 06/02/22 0045  AST 50*  ALT 69*  ALKPHOS 56  BILITOT 0.7  PROT 7.3  ALBUMIN 3.8   No results  for input(s): "LIPASE", "AMYLASE" in the last 168 hours. No results for input(s): "AMMONIA" in the last 168 hours.  CBC: Recent Labs  Lab 06/02/22 0045 06/02/22 0057 06/02/22 0352  WBC 6.3  --  5.6  NEUTROABS 3.5  --   --   HGB 13.7 13.9 13.0  HCT 43.7 41.0 40.3  MCV 91.8  --  88.2  PLT 260  --  251    Cardiac Enzymes: No results for input(s): "CKTOTAL", "CKMB", "CKMBINDEX", "TROPONINI" in the last 168 hours.  BNP: BNP (last 3 results) Recent Labs    04/27/22 1255 05/07/22 1116 06/02/22 0045  BNP 739.2* 528.8* 955.7*    ProBNP (last 3 results) No results for input(s): "PROBNP" in the last 8760 hours.   CBG: Recent Labs  Lab 06/02/22 0809  GLUCAP 219*    Coagulation Studies: No results for input(s): "LABPROT", "INR" in the last 72 hours.   Imaging   DG Chest Port 1 View  Result Date: 06/02/2022 CLINICAL DATA:  Dyspnea EXAM: PORTABLE CHEST 1 VIEW COMPARISON:  Radiographs 03/22/2022 FINDINGS: Stable linear scarring in the left  lateral mid lung. Postoperative changes in the right lung. No pleural effusion or pneumothorax. Stable borderline cardiomegaly. Aortic atherosclerotic calcification. Prominent central pulmonary arteries. Diffuse interstitial coarsening. No acute osseous abnormality. IMPRESSION: 1. No acute cardiopulmonary process. 2. Prominent central pulmonary arteries possibly due to pulmonary hypertension. 3. Chronic interstitial coarsening. Electronically Signed   By: Placido Sou M.D.   On: 06/02/2022 01:03     Medications:     Current Medications:  apixaban  2.5 mg Oral BID   fluticasone furoate-vilanterol  1 puff Inhalation Daily   furosemide  40 mg Intravenous BID   insulin aspart  0-5 Units Subcutaneous QHS   insulin aspart  0-9 Units Subcutaneous TID WC   pantoprazole  40 mg Oral Daily   pravastatin  10 mg Oral Daily   sodium chloride flush  3 mL Intravenous Q12H   umeclidinium bromide  1 puff Inhalation Daily     Infusions:     Patient Profile   81 y.o. female with hx of chronic diastolic CHF with RV failure, pulmonary hypertension, chronic respiratory failure on home O2, COPD, hx PE, hx lung cancer, DM II, CKD III.   Admitted with a/c respiratory failure with hypoxia secondary to a/c Chf.   Assessment/Plan   1. Acute on chronic diastolic CHF with RV failure: Echo in 2/23 showed EF 60-65%, mild LVH, D-shaped septum, moderate RV dysfunction and moderate RV enlargement, PASP 90 mmHg, mild-moderate TR.   - Appears volume up on exam. Currently NYHA IV. Increase IV lasix to 80 BID.  - Continue Jardiance 10 mg daily. - Hold losartan with soft BP - Consider spiro if repeat K trending down (4.8 this am) 2. Acute on chronic respiratory failure with hypoxia: - Secondary to a/c CHF +/- pulmonary disease (? How much underlying lung disease contributing) - On chronic O2, 4L at rest and 6L with activity at home. Currently on 11L HFNC.  - Consult pulmonary. 3. COPD: She no longer smokes.  She has advanced emphysema by CT. She is on home oxygen.  4. H/o PE: She is on Eliquis. No bleeding issues. 5. Pulmonary hypertension: Echo in 2/23 showed EF 60-65%, mild LVH, D-shaped septum, moderate RV dysfunction and moderate RV enlargement, PASP 90 mmHg, mild-moderate TR.  RHC in 2/23 was somewhat surprising with elevated PCWP but normal RA pressure.  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  The [place] may be primarily group 3, though would consider group 1 component with PAH out of proportion to lung disease. Repeat RHC 7/23 showed normal right and left filling pressures and severe pulmonary arterial hypertension. - Has not yet completed PFTs - V/Q scan negative for PEs.  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 Recently referred to Dr. Amil Amen with Rheumatology for further workup 6. DM II: -A1c 6.7% in May 2023 -Per primary team -Continue  jardiance 7. AKI on CKD IIIa: Baseline Scr 1.0.  -Scr 1.36 early am in setting of a/c CHF -Watch Scr with diuresis  Length of Stay: 0  [facility], LINDSAY N, PA-C  06/02/2022, 10:43 AM  Advanced Heart Failure Team Pager [phone] (M-F; 7a - 5p)  Please contact [facility] for night-coverage after hours (4p -7a ) and weekends on [email or web address]   Patient seen with PA, agree with the above note.   History as noted above. She reports worsening dyspnea x 1 month.  The day prior to admission, she was short of breath just walking around the house and her oxygen  saturation was low on her home regimen of 6L [place].   She has received Solumedrol as well as Lasix 40 mg IV in the ER and was transiently on Bipap, now on [place].   General: Tachypneic but not in distress.  Neck: JVP 12-14 cm, no thyromegaly or thyroid nodule.  Lungs: Distant BS.  CV: Nondisplaced PMI.  Heart regular S1/S2, no S3/S4, no murmur.  No peripheral edema.  No carotid bruit.  Normal pedal pulses.  Abdomen: Soft, nontender, no hepatosplenomegaly, mild distention.  Skin: Intact without lesions or rashes.  Neurologic: Alert and oriented x 3.  Psych: Normal affect. Extremities: No clubbing or cyanosis.  HEENT: Normal.   1. RV failure: Echo in 2/23 showed EF 60-65%, mild LVH, D-shaped septum, moderate RV dysfunction and moderate RV enlargement, PASP 90 mmHg, mild-moderate TR.  RHC in 7/23 showed normal filling pressures but severe PAH.  She has been on Lasix 40 mg daily at home.  She is clearly volume overloaded on exam today, suspect primarily RV failure based on history.  - Lasix 80 mg IV bid, give dose now.  - Continue Jardiance 10 mg daily. - Add spironolactone if creatinine remains stable.  2. AECOPD: She no longer smokes.  She has advanced emphysema by CT. She is on home oxygen 4-6L. Very distant breath sounds on exam, using accessory muscles. CXR does not show PNA.  Initially she was on Bipap, now on HFNC 11 L.  - Would favor  treatment for AECOPD in addition to CHF treatment.  She has had a dose of Solumedrol.  - With advanced lung disease and tachypnea, will consult pulmonary.  3. H/o PE: She is on Eliquis. No bleeding issues. 4. Pulmonary hypertension: Echo in 2/23 showed EF 60-65%, mild LVH, D-shaped septum, moderate RV dysfunction and moderate RV enlargement, PASP 90 mmHg, mild-moderate TR. RHC 7/23 showed normal right and left filling pressures and severe pulmonary arterial hypertension with PVR 10 WU.  V/Q scan not suggestive of chronic PEs.  Severe emphysema by prior CT.  Suspect primarily group 3 PH but cannot fully rule out group 1.  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 Referred to Dr. Benjamine Mola with Rheumatology. - Consider trial of Tyvaso for group 1 component as outpatient (avoid potential V/Q mismatch causing worsening hypoxemia with systemic agent).   Loralie Champagne 06/02/2022 12:25 PM

## 2022-06-03 ENCOUNTER — Inpatient Hospital Stay (HOSPITAL_COMMUNITY): Payer: Medicare Other

## 2022-06-03 DIAGNOSIS — J9621 Acute and chronic respiratory failure with hypoxia: Secondary | ICD-10-CM | POA: Diagnosis not present

## 2022-06-03 DIAGNOSIS — I5033 Acute on chronic diastolic (congestive) heart failure: Secondary | ICD-10-CM | POA: Diagnosis not present

## 2022-06-03 DIAGNOSIS — Q211 Atrial septal defect, unspecified: Secondary | ICD-10-CM

## 2022-06-03 LAB — HEPATIC FUNCTION PANEL
ALT: 51 U/L — ABNORMAL HIGH (ref 0–44)
AST: 26 U/L (ref 15–41)
Albumin: 3.3 g/dL — ABNORMAL LOW (ref 3.5–5.0)
Alkaline Phosphatase: 52 U/L (ref 38–126)
Bilirubin, Direct: <0.1 mg/dL (ref 0.0–0.2)
Total Bilirubin: 0.6 mg/dL (ref 0.3–1.2)
Total Protein: 6.5 g/dL (ref 6.5–8.1)

## 2022-06-03 LAB — CBC
HCT: 39.8 % (ref 36.0–46.0)
Hemoglobin: 13 g/dL (ref 12.0–15.0)
MCH: 28.6 pg (ref 26.0–34.0)
MCHC: 32.7 g/dL (ref 30.0–36.0)
MCV: 87.7 fL (ref 80.0–100.0)
Platelets: 254 10*3/uL (ref 150–400)
RBC: 4.54 MIL/uL (ref 3.87–5.11)
RDW: 17.9 % — ABNORMAL HIGH (ref 11.5–15.5)
WBC: 8.4 10*3/uL (ref 4.0–10.5)
nRBC: 0 % (ref 0.0–0.2)

## 2022-06-03 LAB — GLUCOSE, CAPILLARY
Glucose-Capillary: 163 mg/dL — ABNORMAL HIGH (ref 70–99)
Glucose-Capillary: 208 mg/dL — ABNORMAL HIGH (ref 70–99)
Glucose-Capillary: 165 mg/dL — ABNORMAL HIGH (ref 70–99)
Glucose-Capillary: 171 mg/dL — ABNORMAL HIGH (ref 70–99)

## 2022-06-03 LAB — BASIC METABOLIC PANEL
Anion gap: 9 (ref 5–15)
BUN: 30 mg/dL — ABNORMAL HIGH (ref 8–23)
CO2: 26 mmol/L (ref 22–32)
Calcium: 9.3 mg/dL (ref 8.9–10.3)
Chloride: 101 mmol/L (ref 98–111)
Creatinine, Ser: 1.84 mg/dL — ABNORMAL HIGH (ref 0.44–1.00)
GFR, Estimated: 27 mL/min — ABNORMAL LOW (ref >60)
Glucose, Bld: 159 mg/dL — ABNORMAL HIGH (ref 70–99)
Potassium: 3.9 mmol/L (ref 3.5–5.1)
Sodium: 136 mmol/L (ref 135–145)

## 2022-06-03 LAB — ECHOCARDIOGRAM LIMITED BUBBLE STUDY
Calc EF: 53.3 %
Single Plane A2C EF: 47.3 %
Single Plane A4C EF: 58.6 %

## 2022-06-03 LAB — MAGNESIUM: Magnesium: 2 mg/dL (ref 1.7–2.4)

## 2022-06-03 MED ORDER — SODIUM CHLORIDE 0.9% FLUSH
3.0000 mL | Freq: Two times a day (BID) | INTRAVENOUS | Status: DC
  Administered 2022-06-03 – 2022-06-05 (×5): 3 mL via INTRAVENOUS

## 2022-06-03 MED ORDER — SILDENAFIL CITRATE 20 MG PO TABS
20.0000 mg | ORAL_TABLET | Freq: Three times a day (TID) | ORAL | Status: DC
  Administered 2022-06-03 (×3): 20 mg via ORAL
  Filled 2022-06-03 (×3): qty 1

## 2022-06-03 NOTE — Progress Notes (Signed)
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  Presented to the ED with worsening dyspnea on exertion, O2 sats in the 70s recently, required BiPAP on admission now weaned down to 11 L high flow, labs noted creatinine of 1.3, BNP 955, chest x-ray with prominent central pulmonary arteries and interstitial coarsening   Subjective: -Feels fair overall, breathing marginally better  Assessment and Plan:  Acute on chronic diastolic CHF, RV failure Severe PAH -Last echo 2/23 with EF 60-65%, moderate RV dysfunction, PASP of 90 mmHg -Appreciate heart failure team input -Hold Lasix dose with worsening AKI -Plan for right heart cath tomorrow, continue Jardiance  Acute hypoxic respiratory failure COPD/advanced emphysema -Has underlying advanced emphysema and postradiation lung scarring -History of PE on Eliquis, with compliance -With a component of CHF exacerbation as well, as above -On 4 to 6 L O2 at baseline recently, currently requiring 10 L -Appreciate pulmonary input, inhalers optimized -Prognosis overall felt to be poor, will request palliative care input  History of pulmonary embolism -Continue Eliquis  Type 2 diabetes mellitus -A1c was 6.7 in 5/23, continue Jardiance  AKI on CKD 3a -Creatinine slightly higher than baseline, monitor with diuresis   DVT prophylaxis: Apixaban Code Status: Full code  Family Communication: Discussed with patient and daughter at bedside yes today Disposition Plan: Home pending improvement in respiratory status  Consultants: Heart failure team, pulmonary, palliative care   Procedures:   Antimicrobials:    Objective: Vitals:   06/03/22 0013 06/03/22 0521  06/03/22 0731 06/03/22 0745  BP: 95/67 93/64  106/73  Pulse: 76 69  73  Resp: _0 Temp: 97.6 F (36.4 C) 98 F (36.7 C)  97.6 F (36.4 C)  TempSrc: Oral Oral  Oral  SpO2: 91% 93% 92% 96%  Weight:  56.4 kg    Height:        Intake/Output Summary (Last 24 hours) at 06/03/2022 1012 Last data filed at 06/03/2022 3086 Gross per 24 hour  Intake 720 ml  Output 1650 ml  Net -930 ml   Filed Weights   06/02/22 2134 06/03/22 0521  Weight: 57 kg 56.4 kg    Examination:  General exam: Pleasant elderly female sitting up in bed, AAOx3, no distress HEENT: Positive JVD CVS: S1-S2, regular rhythm Lungs: Few basilar Rales otherwise clear Abdomen: Soft, nontender, bowel sounds present Extremities: No edema  Skin: No rashes Psychiatry:  Mood & affect appropriate.     Data Reviewed:   CBC: Recent Labs  Lab 06/02/22 0045 06/02/22 0057 06/02/22 0352 06/03/22 0024  WBC 6.3  --  5.6 8.4  NEUTROABS 3.5  --   --   --   HGB 13.7 13.9 13.0 13.0  HCT 43.7 41.0 40.3 39.8  MCV 91.8  --  88.2 87.7  PLT 260  --  251 578   Basic Metabolic Panel: Recent Labs  Lab 06/02/22 0045 06/02/22 0057 06/02/22 0352 06/03/22 0024  NA 137 134* 136 136  K 5.1 4.1 4.8 3.9  CL 104  --  104 101  CO2 19*  --  21* 26  GLUCOSE 203*  --  170* 159*  BUN 25*  --  25* 30*  CREATININE 1.36*  --  1.21* 1.84*  CALCIUM 9.7  --  9.5 9.3  MG  --   --  1.7 2.0   GFR: Estimated Creatinine Clearance: 18.6 mL/min (A) (by C-G formula based on SCr of 1.84 mg/dL (H)). Liver Function Tests: Recent Labs  Lab 06/02/22 0045 06/03/22 0024  AST 50* 26  ALT 69* 51*  ALKPHOS 56 52  BILITOT 0.7 0.6  PROT 7.3 6.5  ALBUMIN 3.8 3.3*   No results for input(s): "LIPASE", "AMYLASE" in the last 168 hours. No results for input(s): "AMMONIA" in the last 168 hours. Coagulation Profile: No results for input(s): "INR", "PROTIME" in the last 168 hours. Cardiac Enzymes: No results for input(s): "CKTOTAL", "CKMB",  "CKMBINDEX", "TROPONINI" in the last 168 hours. BNP (last 3 results) No results for input(s): "PROBNP" in the last 8760 hours. HbA1C: No results for input(s): "HGBA1C" in the last 72 hours. CBG: Recent Labs  Lab 06/02/22 0809 06/02/22 1141 06/02/22 1903 06/02/22 2103 06/03/22 0603  GLUCAP 219* 192* 133* 191* 163*   Lipid Profile: No results for input(s): "CHOL", "HDL", "LDLCALC", "TRIG", "CHOLHDL", "LDLDIRECT" in the last 72 hours. Thyroid Function Tests: No results for input(s): "TSH", "T4TOTAL", "FREET4", "T3FREE", "THYROIDAB" in the last 72 hours. Anemia Panel: No results for input(s): "VITAMINB12", "FOLATE", "FERRITIN", "TIBC", "IRON", "RETICCTPCT" in the last 72 hours. Urine analysis:    Component Value Date/Time   BILIRUBINUR negative 10/09/2020 1205   PROTEINUR Negative 10/09/2020 1205   UROBILINOGEN 0.2 10/09/2020 1205   NITRITE negative 10/09/2020 1205   LEUKOCYTESUR Negative 10/09/2020 1205   Sepsis Labs: _0 (procalcitonin:4,lacticidven:4)  )No results found for this or any previous visit (from the past 240 hour(s)).   Radiology Studies: VAS [place] LOWER EXTREMITY VENOUS (DVT)  Result Date: 06/02/2022  Lower Venous DVT Study Patient Name:  Brianna Adams  Date of Exam:   06/02/2022 Medical Rec #: [id number]      Accession #:    [id number] Date of Birth: 15-Jul-1941       Patient Gender: F Patient Age:   89 years Exam Location:  [facility] Procedure:      VAS [place] LOWER EXTREMITY VENOUS (DVT) Referring Phys: Ina Homes --------------------------------------------------------------------------------  Indications: Edema.  Comparison Study: no prior Performing Technologist: Archie Patten RVS  Examination Guidelines: A complete evaluation includes B-mode imaging, spectral Doppler, color Doppler, and power Doppler as needed of all accessible portions of each vessel. Bilateral testing is considered an integral part of a complete examination. Limited examinations for  reoccurring indications may be performed as noted. The reflux portion of the exam is performed with the patient in reverse Trendelenburg.  +---------+---------------+---------+-----------+----------+--------------+ RIGHT    CompressibilityPhasicitySpontaneityPropertiesThrombus Aging +---------+---------------+---------+-----------+----------+--------------+ CFV      Full           Yes      Yes                                 +---------+---------------+---------+-----------+----------+--------------+ SFJ      Full                                                        +---------+---------------+---------+-----------+----------+--------------+ FV Prox  Full                                                        +---------+---------------+---------+-----------+----------+--------------+  FV Mid   Full                                                        +---------+---------------+---------+-----------+----------+--------------+ FV DistalFull                                                        +---------+---------------+---------+-----------+----------+--------------+ PFV      Full                                                        +---------+---------------+---------+-----------+----------+--------------+ POP      Full           Yes      Yes                                 +---------+---------------+---------+-----------+----------+--------------+ PTV      Full                                                        +---------+---------------+---------+-----------+----------+--------------+ PERO     Full                                                        +---------+---------------+---------+-----------+----------+--------------+   +---------+---------------+---------+-----------+----------+--------------+ LEFT     CompressibilityPhasicitySpontaneityPropertiesThrombus Aging  +---------+---------------+---------+-----------+----------+--------------+ CFV      Full           Yes      Yes                                 +---------+---------------+---------+-----------+----------+--------------+ SFJ      Full                                                        +---------+---------------+---------+-----------+----------+--------------+ FV Prox  Full                                                        +---------+---------------+---------+-----------+----------+--------------+ FV Mid   Full                                                        +---------+---------------+---------+-----------+----------+--------------+   FV DistalFull                                                        +---------+---------------+---------+-----------+----------+--------------+ PFV      Full                                                        +---------+---------------+---------+-----------+----------+--------------+ POP      Full           Yes      Yes                                 +---------+---------------+---------+-----------+----------+--------------+ PTV      Full                                                        +---------+---------------+---------+-----------+----------+--------------+ PERO     Full                                                        +---------+---------------+---------+-----------+----------+--------------+     Summary: BILATERAL: - No evidence of deep vein thrombosis seen in the lower extremities, bilaterally. -No evidence of popliteal cyst, bilaterally.   *See table(s) above for measurements and observations. Electronically signed by Harold Barban MD on 06/02/2022 at 9:18:02 PM.    Final    DG Chest Port 1 View  Result Date: 06/02/2022 CLINICAL DATA:  Dyspnea EXAM: PORTABLE CHEST 1 VIEW COMPARISON:  Radiographs 03/22/2022 FINDINGS: Stable linear scarring in the left lateral mid lung. Postoperative  changes in the right lung. No pleural effusion or pneumothorax. Stable borderline cardiomegaly. Aortic atherosclerotic calcification. Prominent central pulmonary arteries. Diffuse interstitial coarsening. No acute osseous abnormality. IMPRESSION: 1. No acute cardiopulmonary process. 2. Prominent central pulmonary arteries possibly due to pulmonary hypertension. 3. Chronic interstitial coarsening. Electronically Signed   By: Placido Sou M.D.   On: 06/02/2022 01:03     Scheduled Meds:  apixaban  2.5 mg Oral BID   arformoterol  15 mcg Nebulization BID   empagliflozin  10 mg Oral Daily   insulin aspart  0-5 Units Subcutaneous QHS   insulin aspart  0-9 Units Subcutaneous TID WC   pantoprazole  40 mg Oral Daily   pravastatin  10 mg Oral Daily   revefenacin  175 mcg Nebulization Daily   sildenafil  20 mg Oral TID   sodium chloride flush  3 mL Intravenous Q12H   sodium chloride flush  3 mL Intravenous Q12H   Continuous Infusions:   LOS: 1 day    Time spent: 65mn  PDomenic Polite MD [facility]   06/03/2022, 10:12 AM

## 2022-06-03 NOTE — H&P (View-Only) (Signed)
Advanced Heart Failure Rounding Note  PCP-Cardiologist: None   Subjective:   9/27 Diuresed with IV lasix 80/80. Negative 1.2 liters.   Creatinine 1.2>1.8   Feels ok. Denies SOB  Objective:   Weight Range: 56.4 kg Body mass index is 22.74 kg/m.   Vital Signs:   Temp:  [97.4 F (36.3 C)-98 F (36.7 C)] 98 F (36.7 C) (09/28 0521) Pulse Rate:  [69-92] 69 (09/28 0521) Resp:  [18-28] 18 (09/28 0521) BP: (93-120)/(57-75) 93/64 (09/28 0521) SpO2:  [80 %-93 %] 93 % (09/28 0521) Weight:  [56.4 kg-57 kg] 56.4 kg (09/28 0521) Last BM Date : 06/01/22  Weight change: Filed Weights   06/02/22 2134 06/03/22 0521  Weight: 57 kg 56.4 kg    Intake/Output:   Intake/Output Summary (Last 24 hours) at 06/03/2022 0725 Last data filed at 06/03/2022 0526 Gross per 24 hour  Intake 360 ml  Output 1650 ml  Net -1290 ml      Physical Exam    General: No resp difficulty HEENT: Normal Neck: Supple. JVP 10-11. Carotids 2+ bilat; no bruits. No lymphadenopathy or thyromegaly appreciated. Cor: PMI nondisplaced. Regular rate & rhythm. No rubs, gallops or murmurs. Lungs: Clear on 10 liters HFNC Abdomen: Soft, nontender, nondistended. No hepatosplenomegaly. No bruits or masses. Good bowel sounds. Extremities: No cyanosis, clubbing, rash, edema Neuro: Alert & orientedx3, cranial nerves grossly intact. moves all 4 extremities w/o difficulty. Affect pleasant   Telemetry   SR 70s personally checked.   EKG    N/A  Labs    CBC Recent Labs    06/02/22 0045 06/02/22 0057 06/02/22 0352 06/03/22 0024  WBC 6.3  --  5.6 8.4  NEUTROABS 3.5  --   --   --   HGB 13.7   < > 13.0 13.0  HCT 43.7   < > 40.3 39.8  MCV 91.8  --  88.2 87.7  PLT 260  --  251 254   < > = values in this interval not displayed.   Basic Metabolic Panel Recent Labs    06/02/22 0352 06/03/22 0024  NA 136 136  K 4.8 3.9  CL 104 101  CO2 21* 26  GLUCOSE 170* 159*  BUN 25* 30*  CREATININE 1.21* 1.84*   CALCIUM 9.5 9.3  MG 1.7 2.0   Liver Function Tests Recent Labs    06/02/22 0045 06/03/22 0024  AST 50* 26  ALT 69* 51*  ALKPHOS 56 52  BILITOT 0.7 0.6  PROT 7.3 6.5  ALBUMIN 3.8 3.3*   No results for input(s): "LIPASE", "AMYLASE" in the last 72 hours. Cardiac Enzymes No results for input(s): "CKTOTAL", "CKMB", "CKMBINDEX", "TROPONINI" in the last 72 hours.  BNP: BNP (last 3 results) Recent Labs    04/27/22 1255 05/07/22 1116 06/02/22 0045  BNP 739.2* 528.8* 955.7*    ProBNP (last 3 results) No results for input(s): "PROBNP" in the last 8760 hours.   D-Dimer No results for input(s): "DDIMER" in the last 72 hours. Hemoglobin A1C No results for input(s): "HGBA1C" in the last 72 hours. Fasting Lipid Panel No results for input(s): "CHOL", "HDL", "LDLCALC", "TRIG", "CHOLHDL", "LDLDIRECT" in the last 72 hours. Thyroid Function Tests No results for input(s): "TSH", "T4TOTAL", "T3FREE", "THYROIDAB" in the last 72 hours.  Invalid input(s): "FREET3"  Other results:   Imaging    VAS [place] LOWER EXTREMITY VENOUS (DVT)  Result Date: 06/02/2022  Lower Venous DVT Study Patient Name:  Brianna Adams  Date of Exam:  06/02/2022 Medical Rec #: [id number]      Accession #:    [id number] Date of Birth: 11/15/1940       Patient Gender: F Patient Age:   81 years Exam Location:  [facility] Procedure:      VAS [place] LOWER EXTREMITY VENOUS (DVT) Referring Phys: Ina Homes --------------------------------------------------------------------------------  Indications: Edema.  Comparison Study: no prior Performing Technologist: Archie Patten RVS  Examination Guidelines: A complete evaluation includes B-mode imaging, spectral Doppler, color Doppler, and power Doppler as needed of all accessible portions of each vessel. Bilateral testing is considered an integral part of a complete examination. Limited examinations for reoccurring indications may be performed as noted. The reflux portion of  the exam is performed with the patient in reverse Trendelenburg.  +---------+---------------+---------+-----------+----------+--------------+ RIGHT    CompressibilityPhasicitySpontaneityPropertiesThrombus Aging +---------+---------------+---------+-----------+----------+--------------+ CFV      Full           Yes      Yes                                 +---------+---------------+---------+-----------+----------+--------------+ SFJ      Full                                                        +---------+---------------+---------+-----------+----------+--------------+ FV Prox  Full                                                        +---------+---------------+---------+-----------+----------+--------------+ FV Mid   Full                                                        +---------+---------------+---------+-----------+----------+--------------+ FV DistalFull                                                        +---------+---------------+---------+-----------+----------+--------------+ PFV      Full                                                        +---------+---------------+---------+-----------+----------+--------------+ POP      Full           Yes      Yes                                 +---------+---------------+---------+-----------+----------+--------------+ PTV      Full                                                        +---------+---------------+---------+-----------+----------+--------------+  PERO     Full                                                        +---------+---------------+---------+-----------+----------+--------------+   +---------+---------------+---------+-----------+----------+--------------+ LEFT     CompressibilityPhasicitySpontaneityPropertiesThrombus Aging +---------+---------------+---------+-----------+----------+--------------+ CFV      Full           Yes      Yes                                  +---------+---------------+---------+-----------+----------+--------------+ SFJ      Full                                                        +---------+---------------+---------+-----------+----------+--------------+ FV Prox  Full                                                        +---------+---------------+---------+-----------+----------+--------------+ FV Mid   Full                                                        +---------+---------------+---------+-----------+----------+--------------+ FV DistalFull                                                        +---------+---------------+---------+-----------+----------+--------------+ PFV      Full                                                        +---------+---------------+---------+-----------+----------+--------------+ POP      Full           Yes      Yes                                 +---------+---------------+---------+-----------+----------+--------------+ PTV      Full                                                        +---------+---------------+---------+-----------+----------+--------------+ PERO     Full                                                        +---------+---------------+---------+-----------+----------+--------------+  Summary: BILATERAL: - No evidence of deep vein thrombosis seen in the lower extremities, bilaterally. -No evidence of popliteal cyst, bilaterally.   *See table(s) above for measurements and observations. Electronically signed by Vance Brabham MD on 06/02/2022 at 9:18:02 PM.    Final      Medications:     Scheduled Medications:  apixaban  2.5 mg Oral BID   arformoterol  15 mcg Nebulization BID   empagliflozin  10 mg Oral Daily   furosemide  80 mg Intravenous BID   insulin aspart  0-5 Units Subcutaneous QHS   insulin aspart  0-9 Units Subcutaneous TID WC   pantoprazole  40 mg Oral Daily   pravastatin  10 mg Oral  Daily   revefenacin  175 mcg Nebulization Daily   sodium chloride flush  3 mL Intravenous Q12H    Infusions:   PRN Medications: acetaminophen **OR** acetaminophen, albuterol, ondansetron **OR** ondansetron (ZOFRAN) IV, senna-docusate    Patient Profile   81 y.o. female with hx of chronic diastolic CHF with RV failure, pulmonary hypertension, chronic respiratory failure on home O2, COPD, hx PE, hx lung cancer, DM II, CKD III.    Admitted with a/c respiratory failure with hypoxia secondary to a/c Chf.   Assessment/Plan   1. RV failure: Echo in 2/23 showed EF 60-65%, mild LVH, D-shaped septum, moderate RV dysfunction and moderate RV enlargement, PASP 90 mmHg, mild-moderate TR.  RHC in 7/23 showed normal filling pressures but severe PAH.  She has been on Lasix 40 mg daily at home.  She is clearly volume overloaded on admit, suspect primarily RV failure based on history.  - Renal function worsening with IV diuresis. Hold lasix. Set up RHC for am.  - Continue Jardiance 10 mg daily. - Hold off on spiro due to worsening renal function.  2 AECOPD: She no longer smokes.  She has advanced emphysema by CT. She is on home oxygen 4-6L.  CXR does not show PNA.  Initially she was on Bipap, now on HFNC 11 L.  Pulmonary recommending diuresis + nebs.   3. H/o PE: She is on Eliquis. No bleeding issues. 4. Pulmonary hypertension: Echo in 2/23 showed EF 60-65%, mild LVH, D-shaped septum, moderate RV dysfunction and moderate RV enlargement, PASP 90 mmHg, mild-moderate TR. RHC 7/23 showed normal right and left filling pressures and severe pulmonary arterial hypertension with PVR 10 WU.  V/Q scan not suggestive of chronic PEs.  Severe emphysema by prior CT.  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 Referred to Dr. Rice with Rheumatology. - Consider trial of Tyvaso for group 1  component as outpatient (avoid potential V/Q mismatch causing worsening hypoxemia with systemic agent).  - Try sildenafil.  Hold if SBP <85.  5. AKI: Creatinine higher today at 1.8 with diuresis.  - Hold am Lasix dose, RHC tomorrow.   Discussed with Dr Ferron Ishmael. Plan RHC in am to reassess hemodynamics.  Length of Stay: 1  Amy Clegg, NP  06/03/2022, 7:25 AM  Advanced Heart Failure Team Pager [phone] (M-F; 7a - 5p)  Please contact [facility] for night-coverage after hours (5p -7a ) and weekends on [email or web address]  Patient seen with NP, agree with the above note.   Creatinine higher today at 1.8, she diuresed some yesterday and says that she feels better.  Still on 10 L HFNC.    General: NAD,   frail Neck: JVP 9-10 cm, no thyromegaly or thyroid nodule.  Lungs: Distant BS CV: Nondisplaced PMI.  Heart regular S1/S2, no S3/S4, no murmur.  No peripheral edema.   Abdomen: Soft, nontender, no hepatosplenomegaly, no distention.  Skin: Intact without lesions or rashes.  Neurologic: Alert and oriented x 3.  Psych: Normal affect. Extremities: No clubbing or cyanosis.  HEENT: Normal.   Still looks like she has at least mild volume overload by exam.  With AKI, will hold am Lasix dose and give dose of Lasix 80 mg IV x 1 this afternoon.  Will plan RHC tomorrow to assess filling pressures to make sure we are optimizing her in terms of volume.   She likely has group 2/3 pulmonary hypertension.  PA pressure quite high on last RHC, cannot fully rule out group 1.  I will give her a trial of sildenafil 20 mg tid to see if this helps (stop if BP drops or oxygen worsens).  Tyvaso as an outpatient would be a consideration.   Seen by pulmonary, not thought to have AECOPD.  Continuing inhalers.   It looks like we are primarily dealing with baseline severe underlying lung disease + primarily group 3 PH (due to COPD) and RV failure.  Prognosis here long-term is poor. Would consider palliative care involvement.    Loralie Champagne 06/03/2022 8:23 AM

## 2022-06-03 NOTE — Progress Notes (Addendum)
   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  No cough,  fevers, chills, sick contacts, N/V, aspiration symptoms.  Now needing 10L with desats to 80s with minimal activity.  Given lasix and between that and increased O2 she is feeling better.  Daughter at bedside to corroborate hx.  Ancillary info: Echo 7/0/01 Grade 1 diastolic dysfunction, RV dysfunction and RVSP 90  July 2023 RHC RHC Procedural Findings: Hemodynamics (mmHg) RA mean 6 RV 75/11 PA 77/26, mean 44 PCWP mean 7  Oxygen saturations: PA 59% AO 90%  Cardiac Output (Fick) 3.71  Cardiac Index (Fick) 2.37 PVR: 10 WU  03/22/22 VQ neg  Pertinent  Medical History  COPD Groups 2/3 pulm HTN PE on [facility] GERD DM  Significant Hospital Events: Including procedures, antibiotic start and stop dates in addition to other pertinent events   9/27 admit  Interim History / Subjective:  No events. O2 needs stubborn. No change in symptoms of DOE.  Objective   Blood pressure 97/70, pulse 78, temperature 98.1 F (36.7 C), temperature source Oral, resp. rate 20, height _0  (1.575 m), weight 56.4 kg, SpO2 90 %.        Intake/Output Summary (Last 24 hours) at 06/03/2022 1613 Last data filed at 06/03/2022 1610 Gross per 24 hour  Intake 900 ml  Output 1425 ml  Net -525 ml    Filed Weights   06/02/22 2134 06/03/22 0521  Weight: 57 kg 56.4 kg    Examination: No distress Minimal crackles bases Not much edema Aox3 Moves all 4 ext to command RRR, ext warm  Resolved Hospital Problem list   N/A  Assessment & Plan:  Acute on chronic hypoxemic respiratory failure- some diuresis but AKI with this challenge and no real change in  symptoms.  Echo/bubble with some shunting likely physiologic R to L to offload severely reduced RV. 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  Evidence of RV overload on echo.  Duplex and VQ neg. Hx VTE on [facility] PTA  She has severe emphysema on prior CTs.  Doubt VTE since on eliquis.  CXR does not look overly wet.  She has no signs or symptoms of AECOPD.  I think only possible reversible thing to do here is keep trying diuresis.  - Agree with RHC and shunt run, may need inotropic support - Targeted vasodilator trial per CHF team. - Nebs as ordered, no role for steroids at present - O2 for sats > 90% - Updated daughter over phone - Will follow  Best Practice (right click and "Reselect all SmartList Selections" daily)   Per primary  Erskine Emery MD PCCM Page [phone] if cannot reach

## 2022-06-03 NOTE — Progress Notes (Addendum)
Advanced Heart Failure Rounding Note  PCP-Cardiologist: None   Subjective:   9/27 Diuresed with IV lasix 80/80. Negative 1.2 liters.   Creatinine 1.2>1.8   Feels ok. Denies SOB  Objective:   Weight Range: 56.4 kg Body mass index is 22.74 kg/m.   Vital Signs:   Temp:  [97.4 F (36.3 C)-98 F (36.7 C)] 98 F (36.7 C) (09/28 0521) Pulse Rate:  [69-92] 69 (09/28 0521) Resp:  [18-28] 18 (09/28 0521) BP: (93-120)/(57-75) 93/64 (09/28 0521) SpO2:  [80 %-93 %] 93 % (09/28 0521) Weight:  [56.4 kg-57 kg] 56.4 kg (09/28 0521) Last BM Date : 06/01/22  Weight change: Filed Weights   06/02/22 2134 06/03/22 0521  Weight: 57 kg 56.4 kg    Intake/Output:   Intake/Output Summary (Last 24 hours) at 06/03/2022 0725 Last data filed at 06/03/2022 0526 Gross per 24 hour  Intake 360 ml  Output 1650 ml  Net -1290 ml      Physical Exam    General: No resp difficulty HEENT: Normal Neck: Supple. JVP 10-11. Carotids 2+ bilat; no bruits. No lymphadenopathy or thyromegaly appreciated. Cor: PMI nondisplaced. Regular rate & rhythm. No rubs, gallops or murmurs. Lungs: Clear on 10 liters HFNC Abdomen: Soft, nontender, nondistended. No hepatosplenomegaly. No bruits or masses. Good bowel sounds. Extremities: No cyanosis, clubbing, rash, edema Neuro: Alert & orientedx3, cranial nerves grossly intact. moves all 4 extremities w/o difficulty. Affect pleasant   Telemetry   SR 70s personally checked.   EKG    N/A  Labs    CBC Recent Labs    06/02/22 0045 06/02/22 0057 06/02/22 0352 06/03/22 0024  WBC 6.3  --  5.6 8.4  NEUTROABS 3.5  --   --   --   HGB 13.7   < > 13.0 13.0  HCT 43.7   < > 40.3 39.8  MCV 91.8  --  88.2 87.7  PLT 260  --  251 254   < > = values in this interval not displayed.   Basic Metabolic Panel Recent Labs    06/02/22 0352 06/03/22 0024  NA 136 136  K 4.8 3.9  CL 104 101  CO2 21* 26  GLUCOSE 170* 159*  BUN 25* 30*  CREATININE 1.21* 1.84*   CALCIUM 9.5 9.3  MG 1.7 2.0   Liver Function Tests Recent Labs    06/02/22 0045 06/03/22 0024  AST 50* 26  ALT 69* 51*  ALKPHOS 56 52  BILITOT 0.7 0.6  PROT 7.3 6.5  ALBUMIN 3.8 3.3*   No results for input(s): "LIPASE", "AMYLASE" in the last 72 hours. Cardiac Enzymes No results for input(s): "CKTOTAL", "CKMB", "CKMBINDEX", "TROPONINI" in the last 72 hours.  BNP: BNP (last 3 results) Recent Labs    04/27/22 1255 05/07/22 1116 06/02/22 0045  BNP 739.2* 528.8* 955.7*    ProBNP (last 3 results) No results for input(s): "PROBNP" in the last 8760 hours.   D-Dimer No results for input(s): "DDIMER" in the last 72 hours. Hemoglobin A1C No results for input(s): "HGBA1C" in the last 72 hours. Fasting Lipid Panel No results for input(s): "CHOL", "HDL", "LDLCALC", "TRIG", "CHOLHDL", "LDLDIRECT" in the last 72 hours. Thyroid Function Tests No results for input(s): "TSH", "T4TOTAL", "T3FREE", "THYROIDAB" in the last 72 hours.  Invalid input(s): "FREET3"  Other results:   Imaging    VAS [place] LOWER EXTREMITY VENOUS (DVT)  Result Date: 06/02/2022  Lower Venous DVT Study Patient Name:  Brianna Adams  Date of Exam:  06/02/2022 Medical Rec #: [id number]      Accession #:    [id number] Date of Birth: 05-18-1941       Patient Gender: F Patient Age:   81 years Exam Location:  [facility] Procedure:      VAS [place] LOWER EXTREMITY VENOUS (DVT) Referring Phys: Ina Homes --------------------------------------------------------------------------------  Indications: Edema.  Comparison Study: no prior Performing Technologist: Archie Patten RVS  Examination Guidelines: A complete evaluation includes B-mode imaging, spectral Doppler, color Doppler, and power Doppler as needed of all accessible portions of each vessel. Bilateral testing is considered an integral part of a complete examination. Limited examinations for reoccurring indications may be performed as noted. The reflux portion of  the exam is performed with the patient in reverse Trendelenburg.  +---------+---------------+---------+-----------+----------+--------------+ RIGHT    CompressibilityPhasicitySpontaneityPropertiesThrombus Aging +---------+---------------+---------+-----------+----------+--------------+ CFV      Full           Yes      Yes                                 +---------+---------------+---------+-----------+----------+--------------+ SFJ      Full                                                        +---------+---------------+---------+-----------+----------+--------------+ FV Prox  Full                                                        +---------+---------------+---------+-----------+----------+--------------+ FV Mid   Full                                                        +---------+---------------+---------+-----------+----------+--------------+ FV DistalFull                                                        +---------+---------------+---------+-----------+----------+--------------+ PFV      Full                                                        +---------+---------------+---------+-----------+----------+--------------+ POP      Full           Yes      Yes                                 +---------+---------------+---------+-----------+----------+--------------+ PTV      Full                                                        +---------+---------------+---------+-----------+----------+--------------+  PERO     Full                                                        +---------+---------------+---------+-----------+----------+--------------+   +---------+---------------+---------+-----------+----------+--------------+ LEFT     CompressibilityPhasicitySpontaneityPropertiesThrombus Aging +---------+---------------+---------+-----------+----------+--------------+ CFV      Full           Yes      Yes                                  +---------+---------------+---------+-----------+----------+--------------+ SFJ      Full                                                        +---------+---------------+---------+-----------+----------+--------------+ FV Prox  Full                                                        +---------+---------------+---------+-----------+----------+--------------+ FV Mid   Full                                                        +---------+---------------+---------+-----------+----------+--------------+ FV DistalFull                                                        +---------+---------------+---------+-----------+----------+--------------+ PFV      Full                                                        +---------+---------------+---------+-----------+----------+--------------+ POP      Full           Yes      Yes                                 +---------+---------------+---------+-----------+----------+--------------+ PTV      Full                                                        +---------+---------------+---------+-----------+----------+--------------+ PERO     Full                                                        +---------+---------------+---------+-----------+----------+--------------+  Summary: BILATERAL: - No evidence of deep vein thrombosis seen in the lower extremities, bilaterally. -No evidence of popliteal cyst, bilaterally.   *See table(s) above for measurements and observations. Electronically signed by Harold Barban MD on 06/02/2022 at 9:18:02 PM.    Final      Medications:     Scheduled Medications:  apixaban  2.5 mg Oral BID   arformoterol  15 mcg Nebulization BID   empagliflozin  10 mg Oral Daily   furosemide  80 mg Intravenous BID   insulin aspart  0-5 Units Subcutaneous QHS   insulin aspart  0-9 Units Subcutaneous TID WC   pantoprazole  40 mg Oral Daily   pravastatin  10 mg Oral  Daily   revefenacin  175 mcg Nebulization Daily   sodium chloride flush  3 mL Intravenous Q12H    Infusions:   PRN Medications: acetaminophen **OR** acetaminophen, albuterol, ondansetron **OR** ondansetron (ZOFRAN) IV, senna-docusate    Patient Profile   81 y.o. female with hx of chronic diastolic CHF with RV failure, pulmonary hypertension, chronic respiratory failure on home O2, COPD, hx PE, hx lung cancer, DM II, CKD III.    Admitted with a/c respiratory failure with hypoxia secondary to a/c Chf.   Assessment/Plan   1. RV failure: Echo in 2/23 showed EF 60-65%, mild LVH, D-shaped septum, moderate RV dysfunction and moderate RV enlargement, PASP 90 mmHg, mild-moderate TR.  RHC in 7/23 showed normal filling pressures but severe PAH.  She has been on Lasix 40 mg daily at home.  She is clearly volume overloaded on admit, suspect primarily RV failure based on history.  - Renal function worsening with IV diuresis. Hold lasix. Set up [place] for am.  - Continue Jardiance 10 mg daily. - Hold off on spiro due to worsening renal function.  2 AECOPD: She no longer smokes.  She has advanced emphysema by CT. She is on home oxygen 4-6L.  CXR does not show PNA.  Initially she was on Bipap, now on HFNC 11 L.  Pulmonary recommending diuresis + nebs.   3. H/o PE: She is on Eliquis. No bleeding issues. 4. Pulmonary hypertension: Echo in 2/23 showed EF 60-65%, mild LVH, D-shaped septum, moderate RV dysfunction and moderate RV enlargement, PASP 90 mmHg, mild-moderate TR. RHC 7/23 showed normal right and left filling pressures and severe pulmonary arterial hypertension with PVR 10 WU.  V/Q scan not suggestive of chronic PEs.  Severe emphysema by prior CT.  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 Referred to Dr. Benjamine Mola with Rheumatology. - Consider trial of Tyvaso for group 1  component as outpatient (avoid potential V/Q mismatch causing worsening hypoxemia with systemic agent).  - Try sildenafil.  Hold if SBP <85.  5. AKI: Creatinine higher today at 1.8 with diuresis.  - Hold am Lasix dose, RHC tomorrow.   Discussed with Dr [name]. Plan RHC in am to reassess hemodynamics.  Length of Stay: 1  Amy Clegg, NP  06/03/2022, 7:25 AM  Advanced Heart Failure Team Pager [phone] (M-F; 7a - 5p)  Please contact [facility] for night-coverage after hours (5p -7a ) and weekends on [email or web address]  Patient seen with NP, agree with the above note.   Creatinine higher today at 1.8, she diuresed some yesterday and says that she feels better.  Still on 10 L HFNC.    General: NAD,  frail Neck: JVP 9-10 cm, no thyromegaly or thyroid nodule.  Lungs: Distant BS CV: Nondisplaced PMI.  Heart regular S1/S2, no S3/S4, no murmur.  No peripheral edema.   Abdomen: Soft, nontender, no hepatosplenomegaly, no distention.  Skin: Intact without lesions or rashes.  Neurologic: Alert and oriented x 3.  Psych: Normal affect. Extremities: No clubbing or cyanosis.  HEENT: Normal.   Still looks like she has at least mild volume overload by exam.  With AKI, will hold am Lasix dose and give dose of Lasix 80 mg IV x 1 this afternoon.  Will plan RHC tomorrow to assess filling pressures to make sure we are optimizing her in terms of volume.   She likely has group 2/3 pulmonary hypertension.  PA pressure quite high on last RHC, cannot fully rule out group 1.  I will give her a trial of sildenafil 20 mg tid to see if this helps (stop if BP drops or oxygen worsens).  Tyvaso as an outpatient would be a consideration.   Seen by pulmonary, not thought to have AECOPD.  Continuing inhalers.   It looks like we are primarily dealing with baseline severe underlying lung disease + primarily group 3 PH (due to COPD) and RV failure.  Prognosis here long-term is poor. Would consider palliative care involvement.    Loralie Champagne 06/03/2022 8:23 AM

## 2022-06-03 NOTE — Progress Notes (Signed)
BIPAP is PRN.  Patient showing no distress at this time.

## 2022-06-03 NOTE — TOC Initial Note (Signed)
Transition of Care [facility]) - Initial/Assessment Note    Patient Details  Name: Brianna Adams MRN: [id number] Date of Birth: August 14, 1941  [facility] CM/SW Contact:    Erenest Rasher, RN Phone Number: [phone] 06/03/2022, 4:03 PM  Clinical Narrative:                 HF TOC CM spoke to pt at bedside. Lives with dtr, [name] and gave permission to speak to dtr. Will continue to follow up with pt for dc needs.   Expected Discharge Plan: [place] Barriers to Discharge: Continued Medical Work up   Patient Goals and CMS Choice Patient states their goals for this hospitalization and ongoing recovery are:: wants to feel better CMS Medicare.gov Compare Post Acute Care list provided to:: Patient Choice offered to / list presented to : Patient  Expected Discharge Plan and Services Expected Discharge Plan: [place]   Discharge Planning Services: CM Consult                                          Prior Living Arrangements/Services   Lives with:: Adult Children Patient language and need for interpreter reviewed:: Yes Do you feel safe going back to the place where you live?: Yes      Need for Family Participation in Patient Care: Yes (Comment) Care giver support system in place?: Yes (comment) Current home services: DME (rolling walker with seat, oxygen ([employer])) Criminal Activity/Legal Involvement Pertinent to Current Situation/Hospitalization: No - Comment as needed  Activities of Daily Living Home Assistive Devices/Equipment: Wheelchair (ROLADE) ADL Screening (condition at time of admission) Patient's cognitive ability adequate to safely complete daily activities?: Yes Is the patient deaf or have difficulty hearing?: No Does the patient have difficulty seeing, even when wearing glasses/contacts?: No Does the patient have difficulty concentrating, remembering, or making decisions?: Yes Patient able to express need  for assistance with ADLs?: Yes Does the patient have difficulty dressing or bathing?: Yes Independently performs ADLs?: Yes (appropriate for developmental age) Does the patient have difficulty walking or climbing stairs?: Yes Weakness of Legs: None Weakness of Arms/Hands: None  Permission Sought/Granted Permission sought to share information with : Case Manager, Family Supports, PCP Permission granted to share information with : Yes, Verbal Permission Granted  Share Information with NAME: Contessa Preuss     Permission granted to share info w Relationship: daughter  Permission granted to share info w Contact Information: [phone]  Emotional Assessment Appearance:: Appears stated age Attitude/Demeanor/Rapport: Engaged Affect (typically observed): Accepting Orientation: : Oriented to Self, Oriented to Place, Oriented to  Time, Oriented to Situation   Psych Involvement: No (comment)  Admission diagnosis:  NSTEMI (non-ST elevated myocardial infarction) ([place]) [I21.4] Acute on chronic respiratory failure with hypoxia ([place]) [J96.21] Patient Active Problem List   Diagnosis Date Noted   Acute on chronic respiratory failure with hypoxia ([place]) 06/02/2022   Acute renal failure superimposed on stage 3a chronic kidney disease ([place]) 06/02/2022   Rectal bleeding    Mild non proliferative diabetic retinopathy ([place]) 08/05/2021   Encounter for preoperative pulmonary examination 07/21/2021   Iron deficiency anemia due to chronic blood loss 06/24/2021   Squamous cell carcinoma lung, unspecified laterality ([place]) 06/04/2021   Pneumonia of right upper lobe due to infectious organism 03/09/2021   CKD (chronic kidney disease), stage II 03/09/2021   Chronic  diastolic CHF (congestive heart failure) ([place]) 03/09/2021   HTN (hypertension) 03/09/2021   HLD (hyperlipidemia) 03/09/2021   COPD with acute exacerbation ([place]) 03/09/2021   Elevated troponin 03/09/2021   Type 2 diabetes mellitus with  hyperlipidemia ([place]) 03/09/2021   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[place]) 12/31/2020   Acute on chronic heart failure with preserved ejection fraction (HFpEF) ([place]) 12/09/2020   Lymphadenopathy    Pulmonary embolism ([place]) 02/29/2020   Chronic respiratory failure with hypoxia ([place]) 02/18/2020   Pulmonary nodule 02/18/2020   GERD (gastroesophageal reflux disease) 02/18/2020   Pulmonary hypertension, unspecified ([place]) 10/04/2019   COPD with chronic bronchitis and emphysema ([place]) 03/14/2019   Type 2 diabetes mellitus with stage 3a chronic kidney disease ([place]) 03/14/2019   PCP:  [name], [place] Pharmacy:   [id number] [id number]: [phone]: [phone] [facility] [address]Phone: [phone]: [phone]    Social Determinants of Health (SDOH) Interventions    Readmission Risk Interventions     No data to display

## 2022-06-03 NOTE — Progress Notes (Signed)
Echocardiogram 2D Echocardiogram has been performed.  Brianna Adams 06/03/2022, 9:38 AM

## 2022-06-04 ENCOUNTER — Encounter (HOSPITAL_COMMUNITY): Payer: Self-pay | Admitting: Cardiology

## 2022-06-04 ENCOUNTER — Ambulatory Visit (HOSPITAL_COMMUNITY): Admit: 2022-06-04 | Payer: Medicare Other | Admitting: Cardiology

## 2022-06-04 ENCOUNTER — Other Ambulatory Visit (HOSPITAL_COMMUNITY): Payer: Self-pay

## 2022-06-04 ENCOUNTER — Encounter (HOSPITAL_COMMUNITY)
Admission: EM | Disposition: A | Discharge status: Home Health | Payer: Self-pay | Source: Home / Self Care | Attending: Internal Medicine

## 2022-06-04 ENCOUNTER — Telehealth (HOSPITAL_COMMUNITY): Payer: Self-pay | Admitting: Pharmacy Technician

## 2022-06-04 ENCOUNTER — Telehealth (HOSPITAL_COMMUNITY): Payer: Self-pay

## 2022-06-04 DIAGNOSIS — Z7189 Other specified counseling: Secondary | ICD-10-CM

## 2022-06-04 DIAGNOSIS — Z515 Encounter for palliative care: Secondary | ICD-10-CM | POA: Diagnosis not present

## 2022-06-04 DIAGNOSIS — I272 Pulmonary hypertension, unspecified: Secondary | ICD-10-CM | POA: Diagnosis not present

## 2022-06-04 DIAGNOSIS — I5033 Acute on chronic diastolic (congestive) heart failure: Secondary | ICD-10-CM | POA: Diagnosis not present

## 2022-06-04 DIAGNOSIS — J9621 Acute and chronic respiratory failure with hypoxia: Secondary | ICD-10-CM | POA: Diagnosis not present

## 2022-06-04 HISTORY — PX: RIGHT HEART CATH: CATH118263

## 2022-06-04 LAB — POCT I-STAT EG7
Acid-Base Excess: 2 mmol/L (ref 0.0–2.0)
Acid-Base Excess: 3 mmol/L — ABNORMAL HIGH (ref 0.0–2.0)
Acid-Base Excess: 3 mmol/L — ABNORMAL HIGH (ref 0.0–2.0)
Acid-Base Excess: 3 mmol/L — ABNORMAL HIGH (ref 0.0–2.0)
Acid-Base Excess: 2 mmol/L (ref 0.0–2.0)
Bicarbonate: 28.1 mmol/L — ABNORMAL HIGH (ref 20.0–28.0)
Bicarbonate: 28.2 mmol/L — ABNORMAL HIGH (ref 20.0–28.0)
Bicarbonate: 28.3 mmol/L — ABNORMAL HIGH (ref 20.0–28.0)
Bicarbonate: 28.8 mmol/L — ABNORMAL HIGH (ref 20.0–28.0)
Bicarbonate: 28.2 mmol/L — ABNORMAL HIGH (ref 20.0–28.0)
Calcium, Ion: 1.33 mmol/L (ref 1.15–1.40)
Calcium, Ion: 1.33 mmol/L (ref 1.15–1.40)
Calcium, Ion: 1.28 mmol/L (ref 1.15–1.40)
Calcium, Ion: 1.3 mmol/L (ref 1.15–1.40)
Calcium, Ion: 1.32 mmol/L (ref 1.15–1.40)
HCT: 42 % (ref 36.0–46.0)
HCT: 43 % (ref 36.0–46.0)
HCT: 42 % (ref 36.0–46.0)
HCT: 42 % (ref 36.0–46.0)
HCT: 42 % (ref 36.0–46.0)
Hemoglobin: 14.3 g/dL (ref 12.0–15.0)
Hemoglobin: 14.6 g/dL (ref 12.0–15.0)
Hemoglobin: 14.3 g/dL (ref 12.0–15.0)
Hemoglobin: 14.3 g/dL (ref 12.0–15.0)
Hemoglobin: 14.3 g/dL (ref 12.0–15.0)
O2 Saturation: 55 %
O2 Saturation: 55 %
O2 Saturation: 58 %
O2 Saturation: 56 %
O2 Saturation: 58 %
Potassium: 4.1 mmol/L (ref 3.5–5.1)
Potassium: 4.1 mmol/L (ref 3.5–5.1)
Potassium: 4.1 mmol/L (ref 3.5–5.1)
Potassium: 4.1 mmol/L (ref 3.5–5.1)
Potassium: 4.1 mmol/L (ref 3.5–5.1)
Sodium: 141 mmol/L (ref 135–145)
Sodium: 141 mmol/L (ref 135–145)
Sodium: 141 mmol/L (ref 135–145)
Sodium: 141 mmol/L (ref 135–145)
Sodium: 141 mmol/L (ref 135–145)
TCO2: 30 mmol/L (ref 22–32)
TCO2: 30 mmol/L (ref 22–32)
TCO2: 30 mmol/L (ref 22–32)
TCO2: 30 mmol/L (ref 22–32)
TCO2: 30 mmol/L (ref 22–32)
pCO2, Ven: 45.9 mmHg (ref 44–60)
pCO2, Ven: 46.5 mmHg (ref 44–60)
pCO2, Ven: 46.8 mmHg (ref 44–60)
pCO2, Ven: 47.7 mmHg (ref 44–60)
pCO2, Ven: 46.5 mmHg (ref 44–60)
pH, Ven: 7.39 (ref 7.25–7.43)
pH, Ven: 7.396 (ref 7.25–7.43)
pH, Ven: 7.39 (ref 7.25–7.43)
pH, Ven: 7.389 (ref 7.25–7.43)
pH, Ven: 7.39 (ref 7.25–7.43)
pO2, Ven: 29 mmHg — CL (ref 32–45)
pO2, Ven: 29 mmHg — CL (ref 32–45)
pO2, Ven: 31 mmHg — CL (ref 32–45)
pO2, Ven: 30 mmHg — CL (ref 32–45)
pO2, Ven: 31 mmHg — CL (ref 32–45)

## 2022-06-04 LAB — BASIC METABOLIC PANEL
Anion gap: 8 (ref 5–15)
BUN: 26 mg/dL — ABNORMAL HIGH (ref 8–23)
CO2: 26 mmol/L (ref 22–32)
Calcium: 9.1 mg/dL (ref 8.9–10.3)
Chloride: 102 mmol/L (ref 98–111)
Creatinine, Ser: 1.13 mg/dL — ABNORMAL HIGH (ref 0.44–1.00)
GFR, Estimated: 49 mL/min — ABNORMAL LOW (ref >60)
Glucose, Bld: 125 mg/dL — ABNORMAL HIGH (ref 70–99)
Potassium: 4.1 mmol/L (ref 3.5–5.1)
Sodium: 136 mmol/L (ref 135–145)

## 2022-06-04 LAB — GLUCOSE, CAPILLARY
Glucose-Capillary: 149 mg/dL — ABNORMAL HIGH (ref 70–99)
Glucose-Capillary: 173 mg/dL — ABNORMAL HIGH (ref 70–99)
Glucose-Capillary: 129 mg/dL — ABNORMAL HIGH (ref 70–99)
Glucose-Capillary: 174 mg/dL — ABNORMAL HIGH (ref 70–99)

## 2022-06-04 LAB — CBC
HCT: 39.3 % (ref 36.0–46.0)
Hemoglobin: 13.2 g/dL (ref 12.0–15.0)
MCH: 29.4 pg (ref 26.0–34.0)
MCHC: 33.6 g/dL (ref 30.0–36.0)
MCV: 87.5 fL (ref 80.0–100.0)
Platelets: 242 10*3/uL (ref 150–400)
RBC: 4.49 MIL/uL (ref 3.87–5.11)
RDW: 17.9 % — ABNORMAL HIGH (ref 11.5–15.5)
WBC: 7.1 10*3/uL (ref 4.0–10.5)
nRBC: 0 % (ref 0.0–0.2)

## 2022-06-04 LAB — MAGNESIUM: Magnesium: 2.2 mg/dL (ref 1.7–2.4)

## 2022-06-04 SURGERY — RIGHT HEART CATH
Anesthesia: LOCAL

## 2022-06-04 MED ORDER — SODIUM CHLORIDE 0.9 % IV SOLN: INTRAVENOUS | Status: DC

## 2022-06-04 MED ORDER — BUDESONIDE 0.5 MG/2ML IN SUSP
0.5000 mg | Freq: Two times a day (BID) | RESPIRATORY_TRACT | Status: DC
  Administered 2022-06-04 – 2022-06-06 (×4): 0.5 mg via RESPIRATORY_TRACT
  Filled 2022-06-04 (×4): qty 2

## 2022-06-04 MED ORDER — TADALAFIL 20 MG PO TABS
20.0000 mg | ORAL_TABLET | Freq: Every day | ORAL | Status: DC
  Administered 2022-06-04 – 2022-06-06 (×3): 20 mg via ORAL
  Filled 2022-06-04 (×3): qty 1

## 2022-06-04 MED ORDER — LIDOCAINE HCL (PF) 1 % IJ SOLN
INTRAMUSCULAR | Status: DC | PRN
  Administered 2022-06-04: 2 mL

## 2022-06-04 MED ORDER — SODIUM CHLORIDE 0.9 % IV SOLN: 250.0000 mL | INTRAVENOUS | Status: DC | PRN

## 2022-06-04 MED ORDER — ONDANSETRON HCL 4 MG/2ML IJ SOLN: 4.0000 mg | Freq: Four times a day (QID) | INTRAMUSCULAR | Status: DC | PRN

## 2022-06-04 MED ORDER — FUROSEMIDE 40 MG PO TABS
40.0000 mg | ORAL_TABLET | Freq: Every day | ORAL | Status: DC
  Administered 2022-06-05: 40 mg via ORAL
  Filled 2022-06-04 (×2): qty 1

## 2022-06-04 MED ORDER — ACETAMINOPHEN 325 MG PO TABS: 650.0000 mg | ORAL_TABLET | ORAL | Status: DC | PRN

## 2022-06-04 MED ORDER — HEPARIN (PORCINE) IN NACL 1000-0.9 UT/500ML-% IV SOLN
INTRAVENOUS | Status: AC
  Filled 2022-06-04: qty 500

## 2022-06-04 MED ORDER — SODIUM CHLORIDE 0.9% FLUSH: 3.0000 mL | INTRAVENOUS | Status: DC | PRN

## 2022-06-04 MED ORDER — LIDOCAINE HCL (PF) 1 % IJ SOLN
INTRAMUSCULAR | Status: AC
  Filled 2022-06-04: qty 30

## 2022-06-04 MED ORDER — HEPARIN (PORCINE) IN NACL 1000-0.9 UT/500ML-% IV SOLN
INTRAVENOUS | Status: DC | PRN
  Administered 2022-06-04: 500 mL

## 2022-06-04 MED ORDER — TADALAFIL (PAH) 20 MG PO TABS
20.0000 mg | ORAL_TABLET | Freq: Every day | ORAL | 11 refills | Status: DC
  Filled 2022-06-04: qty 30, 30d supply, fill #0

## 2022-06-04 MED ORDER — LABETALOL HCL 5 MG/ML IV SOLN: 10.0000 mg | INTRAVENOUS | Status: AC | PRN

## 2022-06-04 MED ORDER — HYDRALAZINE HCL 20 MG/ML IJ SOLN: 10.0000 mg | INTRAMUSCULAR | Status: AC | PRN

## 2022-06-04 MED ORDER — SODIUM CHLORIDE 0.9 % IV SOLN
250.0000 mL | INTRAVENOUS | Status: DC | PRN
  Administered 2022-06-04: 250 mL via INTRAVENOUS

## 2022-06-04 MED ORDER — SODIUM CHLORIDE 0.9% FLUSH
3.0000 mL | Freq: Two times a day (BID) | INTRAVENOUS | Status: DC
  Administered 2022-06-04 – 2022-06-05 (×2): 3 mL via INTRAVENOUS

## 2022-06-04 MED ORDER — ASPIRIN 81 MG PO CHEW
81.0000 mg | CHEWABLE_TABLET | ORAL | Status: AC
  Administered 2022-06-04: 81 mg via ORAL
  Filled 2022-06-04: qty 1

## 2022-06-04 SURGICAL SUPPLY — 6 items
CATH BALLN WEDGE 5F 110CM (CATHETERS) IMPLANT
GUIDEWIRE .025 260CM (WIRE) IMPLANT
KIT HEART LEFT (KITS) ×1 IMPLANT
PACK CARDIAC CATHETERIZATION (CUSTOM PROCEDURE TRAY) ×1 IMPLANT
SHEATH GLIDE SLENDER 4/5FR (SHEATH) IMPLANT
TRANSDUCER W/STOPCOCK (MISCELLANEOUS) ×1 IMPLANT

## 2022-06-04 NOTE — Progress Notes (Signed)
SATURATION QUALIFICATIONS: (This note is used to comply with regulatory documentation for home oxygen)  Patient Saturations on Room Air at Rest = N/T  Patient Saturations on Room Air while Ambulating = N/T  Patient Saturations on 8 Liters of oxygen while Ambulating = 86%  Please briefly explain why patient needs home oxygen: Pt requiring 10L/min supplemental O2 to maintain sats >87% during basic functional activity.   Rolinda Roan, PT, DPT Acute Rehabilitation Services Secure Chat Preferred Office: [phone]

## 2022-06-04 NOTE — Interval H&P Note (Signed)
History and Physical Interval Note:  06/04/2022 8:56 AM  Brianna Adams  has presented today for surgery, with the diagnosis of heart failure.  The various methods of treatment have been discussed with the patient and family. After consideration of risks, benefits and other options for treatment, the patient has consented to  Procedure(s): RIGHT HEART CATH (N/A) as a surgical intervention.  The patient's history has been reviewed, patient examined, no change in status, stable for surgery.  I have reviewed the patient's chart and labs.  Questions were answered to the patient's satisfaction.     Neymar Dowe [employer]

## 2022-06-04 NOTE — Progress Notes (Addendum)
Patient ID: [name], female   DOB: [birth year], 81 y.o.   MRN: [id number]     Advanced Heart Failure Rounding Note  PCP-Cardiologist: None   Subjective:    Morning Lasix held yesterday with AKI, got pm dose of 80 mg IV.  I/Os net negative 500 cc.    Creatinine 1.2>1.8>1.13.    Now on HFNC 6L.  Says her breathing has improved.   Echo: EF 55-60%, D-shaped septum, moderate RV enlargement with moderately decreased systolic function, bubble study positive, PASP 64 mmHg.   RHC Procedural Findings: Hemodynamics (mmHg) RA mean 4 RV 69/5 PA 68/23, mean 40 PCWP mean 11 Oxygen saturations: SVC 58% RA 55% RV 58% PA 56% AO 90% Cardiac Output (Fick) 3.35  Cardiac Index (Fick) 2.12 PVR 8.6 WU  Objective:   Weight Range: 57.8 kg Body mass index is 23.31 kg/m.   Vital Signs:   Temp:  [97.6 F (36.4 C)-98.1 F (36.7 C)] 98 F (36.7 C) (09/29 0756) Pulse Rate:  [75-79] 79 (09/29 0756) Resp:  [17-22] 17 (09/29 0756) BP: (91-114)/(62-71) 114/70 (09/29 0756) SpO2:  [87 %-95 %] 92 % (09/29 0756) Weight:  [57.8 kg] 57.8 kg (09/29 0045) Last BM Date : 06/01/22  Weight change: Filed Weights   06/02/22 2134 06/03/22 0521 06/04/22 0045  Weight: 57 kg 56.4 kg 57.8 kg    Intake/Output:   Intake/Output Summary (Last 24 hours) at 06/04/2022 0856 Last data filed at 06/04/2022 0703 Gross per 24 hour  Intake 360 ml  Output 1420 ml  Net -1060 ml      Physical Exam    General: NAD. Thin.  Neck: No JVD, no thyromegaly or thyroid nodule.  Lungs: Clear to auscultation bilaterally with normal respiratory effort. CV: Nondisplaced PMI.  Heart regular S1/S2, no S3/S4, no murmur.  No peripheral edema.   Abdomen: Soft, nontender, no hepatosplenomegaly, no distention.  Skin: Intact without lesions or rashes.  Neurologic: Alert and oriented x 3.  Psych: Normal affect. Extremities: No clubbing or cyanosis.  HEENT: Normal.    Telemetry   NSR 70s personally checked.   EKG     N/A  Labs    CBC Recent Labs    06/02/22 0045 06/02/22 0057 06/03/22 0024 06/04/22 0026  WBC 6.3   < > 8.4 7.1  NEUTROABS 3.5  --   --   --   HGB 13.7   < > 13.0 13.2  HCT 43.7   < > 39.8 39.3  MCV 91.8   < > 87.7 87.5  PLT 260   < > 254 242   < > = values in this interval not displayed.   Basic Metabolic Panel Recent Labs    06/03/22 0024 06/04/22 0026  NA 136 136  K 3.9 4.1  CL 101 102  CO2 26 26  GLUCOSE 159* 125*  BUN 30* 26*  CREATININE 1.84* 1.13*  CALCIUM 9.3 9.1  MG 2.0 2.2   Liver Function Tests Recent Labs    06/02/22 0045 06/03/22 0024  AST 50* 26  ALT 69* 51*  ALKPHOS 56 52  BILITOT 0.7 0.6  PROT 7.3 6.5  ALBUMIN 3.8 3.3*   No results for input(s): "LIPASE", "AMYLASE" in the last 72 hours. Cardiac Enzymes No results for input(s): "CKTOTAL", "CKMB", "CKMBINDEX", "TROPONINI" in the last 72 hours.  BNP: BNP (last 3 results) Recent Labs    04/27/22 1255 05/07/22 1116 06/02/22 0045  BNP 739.2* 528.8* 955.7*    ProBNP (last 3 results) No results  for input(s): "PROBNP" in the last 8760 hours.   D-Dimer No results for input(s): "DDIMER" in the last 72 hours. Hemoglobin A1C No results for input(s): "HGBA1C" in the last 72 hours. Fasting Lipid Panel No results for input(s): "CHOL", "HDL", "LDLCALC", "TRIG", "CHOLHDL", "LDLDIRECT" in the last 72 hours. Thyroid Function Tests No results for input(s): "TSH", "T4TOTAL", "T3FREE", "THYROIDAB" in the last 72 hours.  Invalid input(s): "FREET3"  Other results:   Imaging    ECHOCARDIOGRAM LIMITED BUBBLE STUDY  Result Date: 06/03/2022    ECHOCARDIOGRAM LIMITED REPORT   Patient Name:   Marnesha Gagen Date of Exam: 06/03/2022 Medical Rec #:  [id number]     Height:       62.0 in Accession #:    [id number]    Weight:       124.3 lb Date of Birth:  Jul 05, 1941      BSA:          1.562 m Patient Age:    54 years      BP:           106/73 mmHg Patient Gender: F             HR:           82 bpm. Exam  Location:  Inpatient Procedure: Limited Echo, Limited Color Doppler and Cardiac Doppler Indications:     ASD (atrial septal defect) 745.5 / Q21.1  History:         Patient has prior history of Echocardiogram examinations, most                  recent 10/08/2021. CHF, COPD and Pulmonary HTN,                  Signs/Symptoms:Dyspnea; Risk Factors:Diabetes and GERD.  Sonographer:     Bernadene Person RDCS Referring Phys:  Ina Homes, C Diagnosing Phys: [place] IMPRESSIONS  1. Left ventricular ejection fraction, by estimation, is 55 to 60%. The left ventricle has normal function. There is the interventricular septum is flattened in systole and diastole, consistent with right ventricular pressure and volume overload.  2. Right ventricular systolic function is moderately reduced. The right ventricular size is moderately enlarged. There is severely elevated pulmonary artery systolic pressure.  3. R-L shunt with moderate bubbles~ 3-5 cardiac cycles, c/f interatrial shunt. The septum is not well visualzed.  4. The inferior vena cava is dilated in size with >50% respiratory variability, suggesting right atrial pressure of 8 mmHg. Conclusion(s)/Recommendation(s): Can consider TEE for evaluation for interatrial shunt however with significant PA pressures/RV failure, she is not a candidate for ASD closure. Consider shunt run with RHC. FINDINGS  Left Ventricle: Left ventricular ejection fraction, by estimation, is 55 to 60%. The left ventricle has normal function. The interventricular septum is flattened in systole and diastole, consistent with right ventricular pressure and volume overload. Right Ventricle: The right ventricular size is moderately enlarged. Right ventricular systolic function is moderately reduced. There is severely elevated pulmonary artery systolic pressure. The tricuspid regurgitant velocity is 3.74 m/s, and with an assumed right atrial pressure of 8 mmHg, the estimated right ventricular systolic pressure  is 19.3 mmHg. Venous: The inferior vena cava is dilated in size with greater than 50% respiratory variability, suggesting right atrial pressure of 8 mmHg.  LV Volumes (MOD) LV vol d, MOD A2C: 52.6 ml LV vol d, MOD A4C: 45.2 ml LV vol s, MOD A2C: 27.7 ml LV vol s, MOD A4C: 18.7 ml LV SV  MOD A2C:     24.9 ml LV SV MOD A4C:     45.2 ml LV SV MOD BP:      26.0 ml RIGHT ATRIUM           Index RA Area:     22.30 cm RA Volume:   71.90 ml  46.03 ml/m  TRICUSPID VALVE TR Peak grad:   56.0 mmHg TR Vmax:        374.00 cm/s Phineas Inches Electronically signed by Phineas Inches Signature Date/Time: 06/03/2022/10:17:45 AM    Final (Updated)      Medications:     Scheduled Medications:  [MAR Hold] apixaban  2.5 mg Oral BID   [MAR Hold] arformoterol  15 mcg Nebulization BID   [MAR Hold] empagliflozin  10 mg Oral Daily   [MAR Hold] insulin aspart  0-5 Units Subcutaneous QHS   [MAR Hold] insulin aspart  0-9 Units Subcutaneous TID WC   [MAR Hold] pantoprazole  40 mg Oral Daily   [MAR Hold] pravastatin  10 mg Oral Daily   [MAR Hold] revefenacin  175 mcg Nebulization Daily   [MAR Hold] sildenafil  20 mg Oral TID   [MAR Hold] sodium chloride flush  3 mL Intravenous Q12H   [MAR Hold] sodium chloride flush  3 mL Intravenous Q12H    Infusions:  sodium chloride 250 mL (06/04/22 0702)   [START ON 06/05/2022] sodium chloride      PRN Medications: sodium chloride, [MAR Hold] acetaminophen **OR** [MAR Hold] acetaminophen, [MAR Hold] albuterol, [MAR Hold] ondansetron **OR** [MAR Hold] ondansetron (ZOFRAN) IV, [MAR Hold] senna-docusate, sodium chloride flush    Patient Profile   81 y.o. female with hx of chronic diastolic CHF with RV failure, pulmonary hypertension, chronic respiratory failure on home O2, COPD, hx PE, hx lung cancer, DM II, CKD III.    Admitted with a/c respiratory failure with hypoxia secondary to a/c Chf.   Assessment/Plan   1. RV failure: Echo in 2/23 showed EF 60-65%, mild LVH, D-shaped septum,  moderate RV dysfunction and moderate RV enlargement, PASP 90 mmHg, mild-moderate TR.  RHC in 7/23 showed normal filling pressures but severe PAH.  Echo this admission with  EF 55-60%, D-shaped septum, moderate RV enlargement with moderately decreased systolic function, bubble study positive, PASP 64 mmHg. She has been on Lasix 40 mg daily at home.  She was clearly volume overloaded on admit, suspect primarily RV failure based on history. Renal function worsened with diuresis initially but better today with creatinine 1.1. RHC today showed normal RA and PCWP.   - Resume Lasix 40 mg daily tomorrow.  - Continue Jardiance 10 mg daily.  2 AECOPD: She no longer smokes.  She has advanced emphysema by CT. She is on home oxygen 4-6L.  CXR does not show PNA.  Initially she was on Bipap, now on HFNC 4 L which seems to be her baseline. Seen by pulmonary, not thought to have AECOPD.  Continuing inhalers.  3. H/o PE: She is on Eliquis. No bleeding issues. 4. Pulmonary hypertension: Echo in 2/23 showed EF 60-65%, mild LVH, D-shaped septum, moderate RV dysfunction and moderate RV enlargement, PASP 90 mmHg, mild-moderate TR. RHC 7/23 showed normal right and left filling pressures and severe pulmonary arterial hypertension with PVR 10 WU.  V/Q scan not suggestive of chronic PEs.  Severe emphysema by prior CT.  Suspect primarily group 3 PH (due to COPD and prior lung resection) but may be component of group 1 with  serologic workup showing  ANA + and anti-RNP +, concerning for MCTD/SLE. Referred to Dr. Benjamine Mola with Rheumatology. RHC today showed moderate to severe PAH with PVR 8.6 and CI low but not markedly low at 2.1, RA and PCWP were not elevated.  - She has been started on sildenafil 20 mg tid and has tolerated, will transition to tadalafil 20 mg daily.  Will need to make sure she can get as outpatient.  - Would give her trial of Tyvaso for group 1 component as outpatient (avoid potential V/Q mismatch causing worsening  hypoxemia with systemic agent).  5. AKI: Creatinine lower today 1.8 => 1.1.   6. Positive bubble study: Suspect PFO.  Shunt run done with RHC, no step up. This does not suggest the presence of a significant ASD.   She appears to be getting back to her baseline and should be ready soon for discharge.  Would mobilize.   Loralie Champagne 06/04/2022 8:56 AM

## 2022-06-04 NOTE — Consult Note (Addendum)
Palliative Medicine Inpatient Consult Note  Consulting Provider: Domenic Polite, MD  Reason for consult:   [place] Palliative Medicine Consult  Reason for Consult? Goals of care   06/04/2022  HPI:  Per intake H&P --> 81/F with history of COPD/emphysema on 4 L home O2, prior PE, lung cancer, chronic diastolic CHF, severe pulmonary hypertension, recently seen by pulmonary, O2 increased from 4 to 6 L with activity in the setting of CHF. Palliative care has been asked to get to get involved in the setting of a worsening respiratory state to further address goals of care.   Clinical Assessment/Goals of Care:  *Please note that this is a verbal dictation therefore any spelling or grammatical errors are due to the "[place] [facility]" system interpretation.  I have reviewed medical records including EPIC notes, labs and imaging, received report from bedside RN, assessed the patient.    I met with Brianna Adams and her daughter, Brianna Adams to further discuss diagnosis prognosis, GOC, EOL wishes, disposition and options.   I introduced Palliative Medicine as specialized medical care for people living with serious illness. It focuses on providing relief from the symptoms and stress of a serious illness. The goal is to improve quality of life for both the patient and the family.  Medical History Review and Understanding:  A review of patients past medical history was held inclusive of uterine CA, CHF, COPD on 4LPM [place] at home, pulm HTN.  Brianna Adams understands the reason for admission and need for optimization of her fluid balance.  Social History:  Brianna Adams is originally.  She moved to [place] for college to get her her bachelor's degree in teaching/English in 1962.  She got married in 1963 though her husband has since passed away.  She then went on to get her masters degree at [employer].  She has 1 child, Brianna Adams and no grandchildren.  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  She shares that she gets great joy out of spending time with her family.  She is very involved in her church and belongs to [employer] where the pastor is which she considers her adoptive son.  Functional and Nutritional State:  Prior to hospitalization Brianna Adams was able to mobilize around her home fairly well.  At rest she is on 4L nasal cannula and with mobility 6 L nasal cannula.  She does live with her daughter need who helps with care activities though for the most part she has been able to attend to BADLs on her own.  There have been no significant nutritional deficits.  Advance Directives:  A detailed discussion was had today regarding advanced directives.  Patient does share with me that she has a living well.  Patient's daughter is her only Ambulance person.  Code Status:  A discussion regarding cardiopulmonary resuscitation status was held.  We reviewed what a true resuscitation event would entail.  I shared that often the medical team is required to be fairly aggressive with the hope of getting return of spontaneous circulation.  I reviewed that even if this was something which was obtained we tend to cause trauma and harm to patient's bodies with the inability to improve their underlying disease burden.  Alternatively we discussed DO NOT RESUSCITATE is something that would prevent additional injury and harm to [facility] if she were to be pulseless and not breathing.  She and her daughter are going to take time to discuss her wishes amongst themselves.  She is  planning to complete a 5 wishes form.  Provided "Hard Choices for Loving People" booklet and a MOST form.  Discussion:  We reviewed the progressive nature of the patient's chronic disease processes.  During our time together I focused much of our discussion on heart failure.  We reviewed that Brianna Adams is relatively new to the advanced heart failure program therefore formal goals of care conversations  have not yet taken place.  I shared that heart failure is a progressive disease whereby unfortunately even with the best medicines and treatments it can continue to burden the patient's body and eventually cause failure.  We reviewed that it is important to discuss what Brianna Adams would not would not consider to be a meaningful quality of life in the setting of her disease processes.  Patient's daughter was fairly upset about receiving the information in the ER that the patient had a very poor prognosis.  She expresses that it was shocking to her the way this news was delivered.  She goes on to share that she is not unrealistic and realizes her mother has some fairly severe disease but was not at a point where she wanted to hear it the way it was delivered in the ER.  We discussed that moving forward we would continue to have open and honest conversations and verify what ever we are doing medically aligned with the patient's wishes.  Discussed the importance of continued conversation with family and their  medical providers regarding overall plan of care and treatment options, ensuring decisions are within the context of the patients values and GOCs.  Decision Maker: Brianna Adams (Daughter): [phone] (Mobile)  SUMMARY OF RECOMMENDATIONS   FULL CODE/full scope of care  Open and honest conversations were held regarding the progressive nature heart failure  Provided information on cardiopulmonary resuscitation   Discussed the importance of advance care planning so that family is aware of patient's future wishes  Ongoing palliative care support  Code Status/Advance Care Planning: FULL CODE   Palliative Prophylaxis:  Aspiration, Bowel Regimen, Delirium Protocol, Frequent Pain Assessment, Oral Care, Palliative Wound Care, and Turn Reposition  Additional Recommendations (Limitations, Scope, Preferences): Continue present efforts to improve patient's health  condition  Psycho-social/Spiritual:  Desire for further Chaplaincy support: Not presently patient's pastor was present Additional Recommendations: Education on heart failure   Prognosis: Patient has high chronic disease burden and severe heart failure these things in combination carry a with them an elevated 51-monthmortality risk  Discharge Planning: Discharge plan to home once medically optimized  Vitals:   06/04/22 0045 06/04/22 0611  BP: 91/62 105/71  Pulse:  75  Resp: 20 (!) 22  Temp: 97.8 F (36.6 C) 97.8 F (36.6 C)  SpO2: 94% 92%    Intake/Output Summary (Last 24 hours) at 06/04/2022 09924Last data filed at 06/04/2022 0703 Gross per 24 hour  Intake 720 ml  Output 1420 ml  Net -700 ml   Last Weight  Most recent update: 06/04/2022 12:49 AM    Weight  57.8 kg (127 lb 6.8 oz)            Gen: Elderly African-American female in no acute distress HEENT: moist mucous membranes CV: Regular rate and rhythm PULM: On 4 L nasal cannula breathing is even and unlabored ABD: soft/nontender EXT: No edema Neuro: Alert and oriented x3  PPS:   This conversation/these recommendations were discussed with patient primary care team, Dr. JBroadus John Billing based on MDM: High  Problems Addressed: One acute or chronic  illness or injury that poses a threat to life or bodily function  Amount and/or Complexity of Data: Category 3:Discussion of management or test interpretation with external physician/other qualified health care professional/appropriate source (not separately reported)  Risks: Decision regarding hospitalization or escalation of hospital care and Decision not to resuscitate or to de-escalate care because of poor prognosis ______________________________________________________ [place] Team Team Cell Phone: [phone] Please utilize secure chat with additional questions, if there is no response within 30 minutes please call the  above phone number  Palliative Medicine Team providers are available by phone from 7am to 7pm daily and can be reached through the team cell phone.  Should this patient require assistance outside of these hours, please call the patient's attending physician.

## 2022-06-04 NOTE — Progress Notes (Signed)
NAME:  Brianna Adams, MRN:  [id number], DOB:  08-09-1941, LOS: 2 ADMISSION DATE:  06/02/2022, CONSULTATION DATE:  06/02/22 REFERRING MD:  [name], CHIEF COMPLAINT:  SOB   History of Present Illness:  81 year old woman with hx of COPD on HOT, groups 2/3 PAH, prior lung cancer presenting with 1-2 weeks of worsening SOB that worsened yesterday.  No cough,  fevers, chills, sick contacts, N/V, aspiration symptoms.  Now needing 10L with desats to 80s with minimal activity.  Given lasix and between that and increased O2 she is feeling better.  Daughter at bedside to corroborate hx.  Ancillary info: Echo 4/0/98 Grade 1 diastolic dysfunction, RV dysfunction and RVSP 90  July 2023 RHC RHC Procedural Findings: Hemodynamics (mmHg) RA mean 6 RV 75/11 PA 77/26, mean 44 PCWP mean 7  Oxygen saturations: PA 59% AO 90%  Cardiac Output (Fick) 3.71  Cardiac Index (Fick) 2.37 PVR: 10 WU  03/22/22 VQ neg  Pertinent  Medical History  COPD Groups 2/3 pulm HTN PE on [facility] GERD DM  Significant Hospital Events: Including procedures, antibiotic start and stop dates in addition to other pertinent events   9/27 admit  Interim History / Subjective:  No acute events overnight  Daughter at bedside, all questions answered  She remains on 4-6L of supplemental oxygen, she is feeling better overall  Objective   Blood pressure 115/63, pulse 78, temperature 97.7 F (36.5 C), temperature source Oral, resp. rate 18, height _0  (1.575 m), weight 57.8 kg, SpO2 90 %.        Intake/Output Summary (Last 24 hours) at 06/04/2022 1316 Last data filed at 06/04/2022 0703 Gross per 24 hour  Intake 180 ml  Output 1245 ml  Net -1065 ml   Filed Weights   06/02/22 2134 06/03/22 0521 06/04/22 0045  Weight: 57 kg 56.4 kg 57.8 kg    Examination: No distress, resting in bed Minimal crackles bases, no wheezing No lower extremity edema Aox3 Moves all 4 ext to command RRR, ext warm   RHC Procedural Findings  9/28: Hemodynamics (mmHg) RA mean 4 RV 69/5 PA 68/23, mean 40 PCWP mean 11 Oxygen saturations: SVC 58% RA 55% RV 58% PA 56% AO 90% Cardiac Output (Fick) 3.35  Cardiac Index (Fick) 2.12 PVR 8.6 WU  Resolved Hospital Problem list   N/A  Assessment & Plan:  Acute on chronic hypoxemic respiratory failure- some diuresis but AKI with this challenge and no real change in symptoms.  Echo/bubble with some shunting likely physiologic R to L to offload severely reduced RV. 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  Evidence of RV overload on echo.  Duplex and VQ neg. Hx VTE on [facility] PTA  She has severe emphysema on prior CT chest scans.  Doubt VTE since on eliquis.  She has no signs or symptoms of AECOPD.  She has responded well to diuresis with improvement in her symptoms.  - Diuresis and targeted vasodilator trial per CHF team. - Agree that tiral of Tyvaso can be considered as outpatient in the future - Has referral to rheumatology for evaluation of mixed connective tissue disorder - Nebs as ordered (ICS/LAMA/LABA), no role for systemic steroids at present. She can resume home Breztri inhaler upon discharge. - O2 for sats > 90% - Updated daughter at bedside  PCCM will sign off. She has follow up in pulmonary clinic on 10/4. Please call with any further questions.  Best Practice (right click and "Reselect all SmartList Selections" daily)   Per primary  Freda Jackson, MD [facility] Office: [phone]   See Amion for personal pager PCCM on call pager [phone] until 7pm. Please call Elink 7p-7a. [phone]

## 2022-06-04 NOTE — Telephone Encounter (Signed)
Pharmacy Patient Advocate Encounter  Insurance verification completed.    The patient is insured through [employer] Part D   The patient is currently admitted and ran test claims for the following: sildenafil (Revatio).  Copays and coinsurance results were relayed to Inpatient clinical team.

## 2022-06-04 NOTE — Telephone Encounter (Signed)
Patient Advocate Encounter  Prior Authorization for Tadalafil (PAH) 20MG tablets has been approved.    PA# [id number] Effective dates: 06/04/2022 through 06/03/2025  Patients co-pay is $4.41.     Lyndel Safe, [place] Patient Advocate Specialist [place] Patient Advocate Team Direct Number: [phone]  Fax: [phone]

## 2022-06-04 NOTE — Telephone Encounter (Signed)
Authorization has been submitted via CMM.   Authorization is pending.  Key#  [id number]  PA Case HY:[id number]

## 2022-06-04 NOTE — TOC Transition Note (Signed)
One (1) discharge med stored in the inpatient pharmacy for patient until discharge.

## 2022-06-04 NOTE — Progress Notes (Signed)
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  Presented to the ED with worsening dyspnea on exertion, O2 sats in the 70s recently, required BiPAP on admission now weaned down to 11 L high flow, labs noted creatinine of 1.3, BNP 955, chest x-ray with prominent central pulmonary arteries and interstitial coarsening   Subjective: -Feels okay overall, breathing better, down to 4 L O2  Assessment and Plan:  Acute on chronic diastolic CHF, RV failure Severe PAH -Last echo 2/23 with EF 60-65%, moderate RV dysfunction, PASP of 90 mmHg -Appreciate heart failure team input -Lasix held with worsening AKI, now creatinine has improved, appears close to euvolemic - continue Jardiance -Going down for right heart cath this morning -Palliative consulted for goals of care as well  Acute hypoxic respiratory failure COPD/advanced emphysema -Has underlying advanced emphysema and postradiation lung scarring -History of PE on Eliquis, with compliance -With a component of CHF exacerbation as well, as above -On 4 to 6 L O2 at baseline recently, currently requiring 10 L -Appreciate pulmonary input, inhalers optimized  History of pulmonary embolism -Continue Eliquis  Type 2 diabetes mellitus -A1c was 6.7 in 5/23, continue Jardiance  AKI on CKD 3a -Now improved   DVT prophylaxis: Apixaban Code Status: Full code  Family Communication: Discussed with patient and daughter at bedside 9/28 Disposition Plan: Home pending improvement in respiratory status  Consultants: Heart failure team, pulmonary, palliative care   Procedures:   Antimicrobials:    Objective: Vitals:   06/04/22 0913 06/04/22 0918 06/04/22 0923  06/04/22 0928  BP: 111/73 115/73 120/73 114/80  Pulse: 76 75 74 75  Resp: (!) 26 (!) 21 (!) 22 (!) 24  Temp:      TempSrc:      SpO2: 92% 90% 91% 90%  Weight:      Height:        Intake/Output Summary (Last 24 hours) at 06/04/2022 0940 Last data filed at 06/04/2022 0703 Gross per 24 hour  Intake 360 ml  Output 1420 ml  Net -1060 ml   Filed Weights   06/02/22 2134 06/03/22 0521 06/04/22 0045  Weight: 57 kg 56.4 kg 57.8 kg    Examination:  General exam: Pleasant elderly chronically ill female sitting up in bed, AAOx3, no distress HEENT: No JVD CVS: S1-S2, regular rhythm Lungs: Decreased breath sounds to bases otherwise clear Abdomen: Soft, nontender, bowel sounds present Extremities: No edema Skin: No rashes Psychiatry:  Mood & affect appropriate.     Data Reviewed:   CBC: Recent Labs  Lab 06/02/22 0045 06/02/22 0057 06/02/22 0352 06/03/22 0024 06/04/22 0026 06/04/22 0921 06/04/22 0929  WBC 6.3  --  5.6 8.4 7.1  --   --   NEUTROABS 3.5  --   --   --   --   --   --   HGB 13.7   < > 13.0 13.0 13.2 14.6 14.3  HCT 43.7   < > 40.3 39.8 39.3 43.0 42.0  MCV 91.8  --  88.2 87.7 87.5  --   --   PLT 260  --  251 254 242  --   --    < > = values in this interval not displayed.   Basic  Metabolic Panel: Recent Labs  Lab 06/02/22 0045 06/02/22 0057 06/02/22 0352 06/03/22 0024 06/04/22 0026 06/04/22 0921 06/04/22 0929  NA 137   < > 136 136 136 141 141  K 5.1   < > 4.8 3.9 4.1 4.1 4.1  CL 104  --  104 101 102  --   --   CO2 19*  --  21* 26 26  --   --   GLUCOSE 203*  --  170* 159* 125*  --   --   BUN 25*  --  25* 30* 26*  --   --   CREATININE 1.36*  --  1.21* 1.84* 1.13*  --   --   CALCIUM 9.7  --  9.5 9.3 9.1  --   --   MG  --   --  1.7 2.0 2.2  --   --    < > = values in this interval not displayed.   GFR: Estimated Creatinine Clearance: 30.4 mL/min (A) (by C-G formula based on SCr of 1.13 mg/dL (H)). Liver Function Tests: Recent Labs  Lab 06/02/22 0045  06/03/22 0024  AST 50* 26  ALT 69* 51*  ALKPHOS 56 52  BILITOT 0.7 0.6  PROT 7.3 6.5  ALBUMIN 3.8 3.3*   No results for input(s): "LIPASE", "AMYLASE" in the last 168 hours. No results for input(s): "AMMONIA" in the last 168 hours. Coagulation Profile: No results for input(s): "INR", "PROTIME" in the last 168 hours. Cardiac Enzymes: No results for input(s): "CKTOTAL", "CKMB", "CKMBINDEX", "TROPONINI" in the last 168 hours. BNP (last 3 results) No results for input(s): "PROBNP" in the last 8760 hours. HbA1C: No results for input(s): "HGBA1C" in the last 72 hours. CBG: Recent Labs  Lab 06/03/22 0603 06/03/22 1046 06/03/22 1607 06/03/22 2106 06/04/22 0601  GLUCAP 163* 208* 165* 171* 149*   Lipid Profile: No results for input(s): "CHOL", "HDL", "LDLCALC", "TRIG", "CHOLHDL", "LDLDIRECT" in the last 72 hours. Thyroid Function Tests: No results for input(s): "TSH", "T4TOTAL", "FREET4", "T3FREE", "THYROIDAB" in the last 72 hours. Anemia Panel: No results for input(s): "VITAMINB12", "FOLATE", "FERRITIN", "TIBC", "IRON", "RETICCTPCT" in the last 72 hours. Urine analysis:    Component Value Date/Time   BILIRUBINUR negative 10/09/2020 1205   PROTEINUR Negative 10/09/2020 1205   UROBILINOGEN 0.2 10/09/2020 1205   NITRITE negative 10/09/2020 1205   LEUKOCYTESUR Negative 10/09/2020 1205   Sepsis Labs: _0 (procalcitonin:4,lacticidven:4)  )No results found for this or any previous visit (from the past 240 hour(s)).   Radiology Studies: CARDIAC CATHETERIZATION  Result Date: 06/04/2022 1. Normal filling pressures. 2. Moderate to severe pulmonary arterial hypertension. 3. Low but not markedly low cardiac output. 4. No evidence for significant left to right shunt by shunt run.   ECHOCARDIOGRAM LIMITED BUBBLE STUDY  Result Date: 06/03/2022    ECHOCARDIOGRAM LIMITED REPORT   Patient Name:   Sham Alviar Date of Exam: 06/03/2022 Medical Rec #:  [id number]     Height:       62.0  in Accession #:    [id number]    Weight:       124.3 lb Date of Birth:  [birth year]      BSA:          1.562 m Patient Age:    81 years      BP:           106/73 mmHg Patient Gender: F             HR:  82 bpm. Exam Location:  Inpatient Procedure: Limited Echo, Limited Color Doppler and Cardiac Doppler Indications:     ASD (atrial septal defect) 745.5 / Q21.1  History:         Patient has prior history of Echocardiogram examinations, most                  recent 10/08/2021. CHF, COPD and Pulmonary HTN,                  Signs/Symptoms:Dyspnea; Risk Factors:Diabetes and GERD.  Sonographer:     Bernadene Person RDCS Referring Phys:  Ina Homes, C Diagnosing Phys: [place] IMPRESSIONS  1. Left ventricular ejection fraction, by estimation, is 55 to 60%. The left ventricle has normal function. There is the interventricular septum is flattened in systole and diastole, consistent with right ventricular pressure and volume overload.  2. Right ventricular systolic function is moderately reduced. The right ventricular size is moderately enlarged. There is severely elevated pulmonary artery systolic pressure.  3. R-L shunt with moderate bubbles~ 3-5 cardiac cycles, c/f interatrial shunt. The septum is not well visualzed.  4. The inferior vena cava is dilated in size with >50% respiratory variability, suggesting right atrial pressure of 8 mmHg. Conclusion(s)/Recommendation(s): Can consider TEE for evaluation for interatrial shunt however with significant PA pressures/RV failure, she is not a candidate for ASD closure. Consider shunt run with RHC. FINDINGS  Left Ventricle: Left ventricular ejection fraction, by estimation, is 55 to 60%. The left ventricle has normal function. The interventricular septum is flattened in systole and diastole, consistent with right ventricular pressure and volume overload. Right Ventricle: The right ventricular size is moderately enlarged. Right ventricular systolic function is moderately  reduced. There is severely elevated pulmonary artery systolic pressure. The tricuspid regurgitant velocity is 3.74 m/s, and with an assumed right atrial pressure of 8 mmHg, the estimated right ventricular systolic pressure is 62.2 mmHg. Venous: The inferior vena cava is dilated in size with greater than 50% respiratory variability, suggesting right atrial pressure of 8 mmHg.  LV Volumes (MOD) LV vol d, MOD A2C: 52.6 ml LV vol d, MOD A4C: 45.2 ml LV vol s, MOD A2C: 27.7 ml LV vol s, MOD A4C: 18.7 ml LV SV MOD A2C:     24.9 ml LV SV MOD A4C:     45.2 ml LV SV MOD BP:      26.0 ml RIGHT ATRIUM           Index RA Area:     22.30 cm RA Volume:   71.90 ml  46.03 ml/m  TRICUSPID VALVE TR Peak grad:   56.0 mmHg TR Vmax:        374.00 cm/s Phineas Inches Electronically signed by Phineas Inches Signature Date/Time: 06/03/2022/10:17:45 AM    Final (Updated)    VAS [place] LOWER EXTREMITY VENOUS (DVT)  Result Date: 06/02/2022  Lower Venous DVT Study Patient Name:  MILANNI AYUB  Date of Exam:   06/02/2022 Medical Rec #: [id number]      Accession #:    [id number] Date of Birth: 12-05-1940       Patient Gender: F Patient Age:   60 years Exam Location:  [facility] Procedure:      VAS [place] LOWER EXTREMITY VENOUS (DVT) Referring Phys: Ina Homes --------------------------------------------------------------------------------  Indications: Edema.  Comparison Study: no prior Performing Technologist: Archie Patten RVS  Examination Guidelines: A complete evaluation includes B-mode imaging, spectral Doppler, color Doppler, and power Doppler as needed of all  accessible portions of each vessel. Bilateral testing is considered an integral part of a complete examination. Limited examinations for reoccurring indications may be performed as noted. The reflux portion of the exam is performed with the patient in reverse Trendelenburg.  +---------+---------------+---------+-----------+----------+--------------+ RIGHT     CompressibilityPhasicitySpontaneityPropertiesThrombus Aging +---------+---------------+---------+-----------+----------+--------------+ CFV      Full           Yes      Yes                                 +---------+---------------+---------+-----------+----------+--------------+ SFJ      Full                                                        +---------+---------------+---------+-----------+----------+--------------+ FV Prox  Full                                                        +---------+---------------+---------+-----------+----------+--------------+ FV Mid   Full                                                        +---------+---------------+---------+-----------+----------+--------------+ FV DistalFull                                                        +---------+---------------+---------+-----------+----------+--------------+ PFV      Full                                                        +---------+---------------+---------+-----------+----------+--------------+ POP      Full           Yes      Yes                                 +---------+---------------+---------+-----------+----------+--------------+ PTV      Full                                                        +---------+---------------+---------+-----------+----------+--------------+ PERO     Full                                                        +---------+---------------+---------+-----------+----------+--------------+   +---------+---------------+---------+-----------+----------+--------------+ LEFT  CompressibilityPhasicitySpontaneityPropertiesThrombus Aging +---------+---------------+---------+-----------+----------+--------------+ CFV      Full           Yes      Yes                                 +---------+---------------+---------+-----------+----------+--------------+ SFJ      Full                                                         +---------+---------------+---------+-----------+----------+--------------+ FV Prox  Full                                                        +---------+---------------+---------+-----------+----------+--------------+ FV Mid   Full                                                        +---------+---------------+---------+-----------+----------+--------------+ FV DistalFull                                                        +---------+---------------+---------+-----------+----------+--------------+ PFV      Full                                                        +---------+---------------+---------+-----------+----------+--------------+ POP      Full           Yes      Yes                                 +---------+---------------+---------+-----------+----------+--------------+ PTV      Full                                                        +---------+---------------+---------+-----------+----------+--------------+ PERO     Full                                                        +---------+---------------+---------+-----------+----------+--------------+     Summary: BILATERAL: - No evidence of deep vein thrombosis seen in the lower extremities, bilaterally. -No evidence of popliteal cyst, bilaterally.   *See table(s) above for measurements and observations. Electronically signed by Harold Barban MD on 06/02/2022 at 9:18:02 PM.    Final  Scheduled Meds:  [MAR Hold] apixaban  2.5 mg Oral BID   [MAR Hold] arformoterol  15 mcg Nebulization BID   [MAR Hold] empagliflozin  10 mg Oral Daily   [MAR Hold] insulin aspart  0-5 Units Subcutaneous QHS   [MAR Hold] insulin aspart  0-9 Units Subcutaneous TID WC   [MAR Hold] pantoprazole  40 mg Oral Daily   [MAR Hold] pravastatin  10 mg Oral Daily   [MAR Hold] revefenacin  175 mcg Nebulization Daily   [MAR Hold] sildenafil  20 mg Oral TID   [MAR Hold] sodium chloride flush  3 mL  Intravenous Q12H   [MAR Hold] sodium chloride flush  3 mL Intravenous Q12H   Continuous Infusions:  sodium chloride 250 mL (06/04/22 0702)   [START ON 06/05/2022] sodium chloride       LOS: 2 days    Time spent: 66mn  PDomenic Polite MD [facility]   06/04/2022, 9:40 AM

## 2022-06-04 NOTE — TOC Benefit Eligibility Note (Addendum)
Patient Teacher, English as a foreign language completed.    The patient is currently admitted and upon discharge could be taking sildenafil (Revatio) 20 mg tablets.  Requires Prior Authorization  The patient is currently admitted and upon discharge could be taking tadalafil (PAH) 20 mg tablets.  Requires Prior Authorization  The patient is insured through [place], [place] Patient Advocate Specialist [place] Patient Advocate Team Direct Number: [phone]  Fax: [phone]

## 2022-06-04 NOTE — Care Management Important Message (Signed)
Important Message  Patient Details  Name: Brianna Adams MRN: [id number] Date of Birth: 1940-10-09   Medicare Important Message Given:  Yes     Rylie Knierim 06/04/2022, 3:10 PM

## 2022-06-04 NOTE — Evaluation (Signed)
Physical Therapy Evaluation  Patient Details Name: Brianna Adams MRN: [id number] DOB: [birth year] Today's Date: 06/04/2022  History of Present Illness  Pt is an 81 y/o female who presents 06/02/22 with 1-2 weeks of worsening SOB. She was admitted with acute on chronic respiratory failure. PMH significant for CHF, COPD, DM, diverticulitis, lung CA s/p lung resection, on 4-6 L/min supplemental O2 at home, diabetic retinopathy, PE, pulmonary HTN, uterine CA.   Clinical Impression  Pt admitted with above diagnosis. Pt currently with functional limitations due to the deficits listed below (see PT Problem List). At the time of PT eval pt was able to perform transfers and ambulation with gross min guard assist and up to 10L/min supplemental O2. Pt returned to 6L/min O2 at end of session with sats 91% at rest. Functionally, pt reports she is near baseline however will require continued skilled therapy services for safe advancement of activity within cardiovascular tolerance to facilitate d/c home. Will continue to follow.     Recommendations for follow up therapy are one component of a multi-disciplinary discharge planning process, led by the attending physician.  Recommendations may be updated based on patient status, additional functional criteria and insurance authorization.  Follow Up Recommendations Home health PT      Assistance Recommended at Discharge Frequent or constant Supervision/Assistance  Patient can return home with the following  A little help with walking and/or transfers;A little help with bathing/dressing/bathroom;Assistance with cooking/housework;Assist for transportation;Help with stairs or ramp for entrance    Equipment Recommendations None recommended by PT  Recommendations for Other Services       Functional Status Assessment Patient has had a recent decline in their functional status and demonstrates the ability to make significant improvements in function in a reasonable  and predictable amount of time.     Precautions / Restrictions Precautions Precautions: Fall Precaution Comments: baseline 4-6 L/min supplemental O2 Restrictions Weight Bearing Restrictions: No      Mobility  Bed Mobility Overal bed mobility: Needs Assistance Bed Mobility: Supine to Sit     Supine to sit: Supervision     General bed mobility comments: Increased time but no assist required to transition fully to EOB.    Transfers Overall transfer level: Needs assistance Equipment used: None Transfers: Sit to/from Stand Sit to Stand: Min guard, Min assist           General transfer comment: Light assist to power up to full stand. Increased time to gain/maintain standing balance.    Ambulation/Gait Ambulation/Gait assistance: Min guard Gait Distance (Feet): 25 Feet Assistive device: None Gait Pattern/deviations: Step-through pattern, Decreased stride length, Trunk flexed Gait velocity: Decreased     General Gait Details: Slow and guarded. Pt ambulated around room to/from bathroom. O2 increased to 10L/min due to sats in low 80's.  Stairs            Wheelchair Mobility    Modified Rankin (Stroke Patients Only)       Balance Overall balance assessment: Needs assistance Sitting-balance support: Feet supported, No upper extremity supported Sitting balance-Leahy Scale: Fair     Standing balance support: No upper extremity supported, During functional activity Standing balance-Leahy Scale: Fair                               Pertinent Vitals/Pain Pain Assessment Pain Assessment: No/denies pain    Home Living Family/patient expects to be discharged to:: Private residence Living Arrangements: Children Available Help at  Discharge: Family;Friend(s);Available 24 hours/day Type of Home: House Home Access: Stairs to enter Entrance Stairs-Rails: Psychiatric nurse of Steps: 2-3   Home Layout: One level Home Equipment: Grab  bars - toilet;Grab bars - tub/shower;Shower seat - built in;Rollator (4 wheels)      Prior Function Prior Level of Function : Driving;Independent/Modified Independent             Mobility Comments: Uses the rollator mainly to transport her oxygen due to it being heavy. ADLs Comments: Independent with ADL's, drives occasionally, is able to get out to the grocery store when needed.     Hand Dominance   Dominant Hand: Right    Extremity/Trunk Assessment   Upper Extremity Assessment Upper Extremity Assessment: Overall WFL for tasks assessed (Limited due to multiple lines but overall functional)    Lower Extremity Assessment Lower Extremity Assessment: RLE deficits/detail;LLE deficits/detail RLE Deficits / Details: Bilaterally, quads, hip flexors, ankle DF grossly 4/5 to 4+/5 strength, with hamstrings 4-/5.    Cervical / Trunk Assessment Cervical / Trunk Assessment: Kyphotic;Other exceptions Cervical / Trunk Exceptions: Forward head posture with rounded shoulders.  Communication   Communication: No difficulties  Cognition Arousal/Alertness: Awake/alert Behavior During Therapy: WFL for tasks assessed/performed Overall Cognitive Status: Within Functional Limits for tasks assessed                                          General Comments      Exercises     Assessment/Plan    PT Assessment Patient needs continued PT services  PT Problem List Decreased strength;Decreased activity tolerance;Decreased balance;Decreased mobility;Decreased knowledge of precautions;Cardiopulmonary status limiting activity       PT Treatment Interventions DME instruction;Gait training;Stair training;Functional mobility training;Therapeutic activities;Therapeutic exercise;Balance training;Patient/family education    PT Goals (Current goals can be found in the Care Plan section)  Acute Rehab PT Goals Patient Stated Goal: Return home at d/c, back to PLOF PT Goal Formulation:  With patient/family Time For Goal Achievement: 06/11/22 Potential to Achieve Goals: Good    Frequency Min 3X/week     Co-evaluation               AM-PAC PT "6 Clicks" Mobility  Outcome Measure Help needed turning from your back to your side while in a flat bed without using bedrails?: None Help needed moving from lying on your back to sitting on the side of a flat bed without using bedrails?: None Help needed moving to and from a bed to a chair (including a wheelchair)?: A Little Help needed standing up from a chair using your arms (e.g., wheelchair or bedside chair)?: A Little Help needed to walk in hospital room?: A Little Help needed climbing 3-5 steps with a railing? : A Lot 6 Click Score: 19    End of Session Equipment Utilized During Treatment: Gait belt;Oxygen Activity Tolerance: Treatment limited secondary to medical complications (Comment) (O2 sats dropping with activity) Patient left: in chair;with call bell/phone within reach;with chair alarm set Nurse Communication: Mobility status PT Visit Diagnosis: Unsteadiness on feet (R26.81);Muscle weakness (generalized) (M62.81);Difficulty in walking, not elsewhere classified (R26.2)    Time: 2947-6546 PT Time Calculation (min) (ACUTE ONLY): 39 min   Charges:   PT Evaluation $PT Eval Moderate Complexity: 1 Mod PT Treatments $Gait Training: 23-37 mins        Rolinda Roan, PT, DPT Acute Rehabilitation Services Secure Chat Preferred Office:  [phone]   Thelma Comp 06/04/2022, 2:41 PM

## 2022-06-04 NOTE — Progress Notes (Signed)
Adcirca sent to [facility]  today.   Discussed with pharmacy team.   Darrick Grinder. NP-C  4:08 PM

## 2022-06-05 DIAGNOSIS — Z515 Encounter for palliative care: Secondary | ICD-10-CM | POA: Diagnosis not present

## 2022-06-05 DIAGNOSIS — I272 Pulmonary hypertension, unspecified: Secondary | ICD-10-CM

## 2022-06-05 DIAGNOSIS — J9621 Acute and chronic respiratory failure with hypoxia: Secondary | ICD-10-CM | POA: Diagnosis not present

## 2022-06-05 LAB — GLUCOSE, CAPILLARY
Glucose-Capillary: 144 mg/dL — ABNORMAL HIGH (ref 70–99)
Glucose-Capillary: 111 mg/dL — ABNORMAL HIGH (ref 70–99)
Glucose-Capillary: 177 mg/dL — ABNORMAL HIGH (ref 70–99)

## 2022-06-05 LAB — BASIC METABOLIC PANEL
Anion gap: 7 (ref 5–15)
BUN: 22 mg/dL (ref 8–23)
CO2: 26 mmol/L (ref 22–32)
Calcium: 9.2 mg/dL (ref 8.9–10.3)
Chloride: 106 mmol/L (ref 98–111)
Creatinine, Ser: 1.12 mg/dL — ABNORMAL HIGH (ref 0.44–1.00)
GFR, Estimated: 49 mL/min — ABNORMAL LOW (ref >60)
Glucose, Bld: 127 mg/dL — ABNORMAL HIGH (ref 70–99)
Potassium: 4.3 mmol/L (ref 3.5–5.1)
Sodium: 139 mmol/L (ref 135–145)

## 2022-06-05 LAB — MAGNESIUM: Magnesium: 2.3 mg/dL (ref 1.7–2.4)

## 2022-06-05 MED ORDER — TORSEMIDE 20 MG PO TABS
20.0000 mg | ORAL_TABLET | Freq: Every day | ORAL | Status: DC
  Administered 2022-06-05 – 2022-06-06 (×2): 20 mg via ORAL
  Filled 2022-06-05 (×2): qty 1

## 2022-06-05 MED ORDER — POTASSIUM CHLORIDE CRYS ER 20 MEQ PO TBCR
20.0000 meq | EXTENDED_RELEASE_TABLET | Freq: Every day | ORAL | Status: DC
  Administered 2022-06-05 – 2022-06-06 (×2): 20 meq via ORAL
  Filled 2022-06-05 (×2): qty 1

## 2022-06-05 NOTE — TOC Progression Note (Addendum)
Transition of Care [facility]) - Progression Note    Patient Details  Name: Brianna Adams MRN: [id number] Date of Birth: 09/22/1940  Transition of Care [facility]) CM/SW Contact  Carles Collet, RN Phone Number: 06/05/2022, 3:24 PM  Clinical Narrative:     [name] w patient at bedside, and later daughter [name] by phone. Patient is form home w daughter, has home oxygen and rollator. She is agreeable to [facility] services. Neither her nor daughter have preference. [facility] is able to start care within 24-48 hours after DC, and has accepted referral.  Patient's daughter called me back and requested [facility] after talking to friends from church. [facility] cancelled, and [facility] accepted  Please place [facility] PT OT orders. Thank you  Expected Discharge Plan: [place] Barriers to Discharge: Continued Medical Work up  Expected Discharge Plan and Services Expected Discharge Plan: [place]   Discharge Planning Services: CM Consult Post Acute Care Choice: Home Health                             HH Arranged: PT, OT [facility] Agency: [place] Date [place]: 06/05/22 Time HH Agency Contacted: [id number] Representative spoke with at [place]: [place] Determinants of Health ([place]) Interventions    Readmission Risk Interventions     No data to display

## 2022-06-05 NOTE — Progress Notes (Signed)
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  Presented to the ED with worsening dyspnea on exertion, O2 sats in the 70s recently, required BiPAP on admission now weaned down to 11 L high flow, labs noted creatinine of 1.3, BNP 955, chest x-ray with prominent central pulmonary arteries and interstitial coarsening -Advanced heart failure team and PCCM following, improving on diuresis, oxygen requirement remains high  Subjective: -Feels okay overall, breathing is improving, seen by palliative team yesterday -Oxygen needs went up to 15 L overnight, now down to 10 L this morning  Assessment and Plan:  Acute on chronic diastolic CHF, RV failure Severe PAH -Last echo 2/23 with EF 60-65%, moderate RV dysfunction, PASP of 90 mmHg -Appreciate heart failure team input -Lasix held with worsening AKI, now creatinine has improved, appears close to euvolemic - continue Jardiance -Right heart cath yesterday noted normal right atrial and wedge pressure, restarting torsemide -Palliative consulted for goals of care  -Worsening O2 needs, hold discharge today  Acute hypoxic respiratory failure COPD/advanced emphysema -Has underlying advanced emphysema and postradiation lung scarring -History of PE on Eliquis, with compliance -With a component of CHF exacerbation as well, as above -On 4 to 6 L O2 at baseline recently, now on 9-10 L -Appreciate pulmonary input, inhalers optimized, not felt to have COPD exacerbation -Appears close to euvolemic, agree with incentive spirometry, may need nocturnal BiPAP if O2 requirements increased again overnight  History of pulmonary embolism -Continue Eliquis  Type 2 diabetes  mellitus -A1c was 6.7 in 5/23, continue Jardiance  AKI on CKD 3a -Now improved   DVT prophylaxis: Apixaban Code Status: Full code  Family Communication: Discussed with patient and daughter at bedside 9/29 Disposition Plan: Home pending improvement in respiratory status  Consultants: Heart failure team, pulmonary, palliative care   Procedures: RHC 9/29 showed normal RA and PCWP  Antimicrobials:    Objective: Vitals:   06/04/22 2020 06/04/22 2021 06/05/22 0020 06/05/22 0600  BP:  112/75 98/66 106/73  Pulse:  83 76 75  Resp:  (!) 23 20 (!) 21  Temp:  (!) 97.4 F (36.3 C) (!) 97.4 F (36.3 C) (!) 97.4 F (36.3 C)  TempSrc:  Oral Oral Oral  SpO2: 97% 91% 95% 92%  Weight:   60.2 kg   Height:        Intake/Output Summary (Last 24 hours) at 06/05/2022 0939 Last data filed at 06/05/2022 0830 Gross per 24 hour  Intake 1200 ml  Output 1200 ml  Net 0 ml   Filed Weights   06/03/22 0521 06/04/22 0045 06/05/22 0020  Weight: 56.4 kg 57.8 kg 60.2 kg    Examination:  General exam: Sent elderly female chronically ill sitting up in bed, AAOx3, no distress HEENT: No JVD CVS: S1-S2, regular rhythm Lungs: Decreased breath sounds to bases otherwise clear Abdomen: Soft, nontender, bowel sounds present Extremities: No edema  Skin: No rashes Psychiatry:  Mood & affect appropriate.     Data Reviewed:   CBC: Recent Labs  Lab 06/02/22 0045 06/02/22 0057 06/02/22 8119 06/03/22 0024 06/04/22 0026 06/04/22 1478 06/04/22 0924 06/04/22 0926 06/04/22 0929  WBC 6.3  --  5.6 8.4 7.1  --   --   --   --  NEUTROABS 3.5  --   --   --   --   --   --   --   --   HGB 13.7   < > 13.0 13.0 13.2 14.3  14.6 14.3 14.3 14.3  HCT 43.7   < > 40.3 39.8 39.3 42.0  43.0 42.0 42.0 42.0  MCV 91.8  --  88.2 87.7 87.5  --   --   --   --   PLT 260  --  251 254 242  --   --   --   --    < > = values in this interval not displayed.   Basic Metabolic Panel: Recent Labs  Lab 06/02/22 0045  06/02/22 0057 06/02/22 0352 06/03/22 0024 06/04/22 0026 06/04/22 1478 06/04/22 0924 06/04/22 0926 06/04/22 0929 06/05/22 0110  NA 137   < > 136 136 136 141  141 141 141 141 139  K 5.1   < > 4.8 3.9 4.1 4.1  4.1 4.1 4.1 4.1 4.3  CL 104  --  104 101 102  --   --   --   --  106  CO2 19*  --  21* 26 26  --   --   --   --  26  GLUCOSE 203*  --  170* 159* 125*  --   --   --   --  127*  BUN 25*  --  25* 30* 26*  --   --   --   --  22  CREATININE 1.36*  --  1.21* 1.84* 1.13*  --   --   --   --  1.12*  CALCIUM 9.7  --  9.5 9.3 9.1  --   --   --   --  9.2  MG  --   --  1.7 2.0 2.2  --   --   --   --  2.3   < > = values in this interval not displayed.   GFR: Estimated Creatinine Clearance: 33.1 mL/min (A) (by C-G formula based on SCr of 1.12 mg/dL (H)). Liver Function Tests: Recent Labs  Lab 06/02/22 0045 06/03/22 0024  AST 50* 26  ALT 69* 51*  ALKPHOS 56 52  BILITOT 0.7 0.6  PROT 7.3 6.5  ALBUMIN 3.8 3.3*   No results for input(s): "LIPASE", "AMYLASE" in the last 168 hours. No results for input(s): "AMMONIA" in the last 168 hours. Coagulation Profile: No results for input(s): "INR", "PROTIME" in the last 168 hours. Cardiac Enzymes: No results for input(s): "CKTOTAL", "CKMB", "CKMBINDEX", "TROPONINI" in the last 168 hours. BNP (last 3 results) No results for input(s): "PROBNP" in the last 8760 hours. HbA1C: No results for input(s): "HGBA1C" in the last 72 hours. CBG: Recent Labs  Lab 06/04/22 0601 06/04/22 1155 06/04/22 1609 06/04/22 2145 06/05/22 0600  GLUCAP 149* 173* 129* 174* 144*   Lipid Profile: No results for input(s): "CHOL", "HDL", "LDLCALC", "TRIG", "CHOLHDL", "LDLDIRECT" in the last 72 hours. Thyroid Function Tests: No results for input(s): "TSH", "T4TOTAL", "FREET4", "T3FREE", "THYROIDAB" in the last 72 hours. Anemia Panel: No results for input(s): "VITAMINB12", "FOLATE", "FERRITIN", "TIBC", "IRON", "RETICCTPCT" in the last 72 hours. Urine analysis:     Component Value Date/Time   BILIRUBINUR negative 10/09/2020 1205   PROTEINUR Negative 10/09/2020 1205   UROBILINOGEN 0.2 10/09/2020 1205   NITRITE negative 10/09/2020 1205   LEUKOCYTESUR Negative 10/09/2020 1205   Sepsis Labs: _0 (procalcitonin:4,lacticidven:4)  )No results found for this or any previous  visit (from the past 240 hour(s)).   Radiology Studies: CARDIAC CATHETERIZATION  Result Date: 06/04/2022 1. Normal filling pressures. 2. Moderate to severe pulmonary arterial hypertension. 3. Low but not markedly low cardiac output. 4. No evidence for significant left to right shunt by shunt run.     Scheduled Meds:  apixaban  2.5 mg Oral BID   arformoterol  15 mcg Nebulization BID   budesonide (PULMICORT) nebulizer solution  0.5 mg Nebulization BID   empagliflozin  10 mg Oral Daily   insulin aspart  0-5 Units Subcutaneous QHS   insulin aspart  0-9 Units Subcutaneous TID WC   pantoprazole  40 mg Oral Daily   potassium chloride  20 mEq Oral Daily   pravastatin  10 mg Oral Daily   revefenacin  175 mcg Nebulization Daily   sodium chloride flush  3 mL Intravenous Q12H   sodium chloride flush  3 mL Intravenous Q12H   sodium chloride flush  3 mL Intravenous Q12H   tadalafil  20 mg Oral Daily   torsemide  20 mg Oral Daily   Continuous Infusions:  sodium chloride       LOS: 3 days    Time spent: 66mn  PDomenic Polite MD [facility]   06/05/2022, 9:39 AM

## 2022-06-05 NOTE — Progress Notes (Signed)
Patient ID: Brianna Adams, female   DOB: [birth year], 81 y.o.   MRN: [id number]   Advanced Heart Failure Rounding Note  PCP-Cardiologist: None   Subjective:    Patient reports her breathing is comfortable at rest.    Creatinine 1.2>1.8>1.13>1.12.    Now on HFNC 9L.  Oxygen saturation up and down, currently around 90%.    Echo: EF 55-60%, D-shaped septum, moderate RV enlargement with moderately decreased systolic function, bubble study positive, PASP 64 mmHg.   RHC Procedural Findings: Hemodynamics (mmHg) RA mean 4 RV 69/5 PA 68/23, mean 40 PCWP mean 11 Oxygen saturations: SVC 58% RA 55% RV 58% PA 56% AO 90% Cardiac Output (Fick) 3.35  Cardiac Index (Fick) 2.12 PVR 8.6 WU  Objective:   Weight Range: 60.2 kg Body mass index is 24.27 kg/m.   Vital Signs:   Temp:  [97.4 F (36.3 C)-97.7 F (36.5 C)] 97.4 F (36.3 C) (09/30 0600) Pulse Rate:  [75-83] 75 (09/30 0600) Resp:  [18-24] 21 (09/30 0600) BP: (97-115)/(62-80) 106/73 (09/30 0600) SpO2:  [90 %-97 %] 92 % (09/30 0600) Weight:  [60.2 kg] 60.2 kg (09/30 0020) Last BM Date : 06/04/22  Weight change: Filed Weights   06/03/22 0521 06/04/22 0045 06/05/22 0020  Weight: 56.4 kg 57.8 kg 60.2 kg    Intake/Output:   Intake/Output Summary (Last 24 hours) at 06/05/2022 0925 Last data filed at 06/05/2022 0830 Gross per 24 hour  Intake 1200 ml  Output 1200 ml  Net 0 ml      Physical Exam    General: NAD Neck: JVP 8 cm, no thyromegaly or thyroid nodule.  Lungs: Distant BS CV: Nondisplaced PMI.  Heart regular S1/S2, no S3/S4, no murmur.  No peripheral edema.   Abdomen: Soft, nontender, no hepatosplenomegaly, no distention.  Skin: Intact without lesions or rashes.  Neurologic: Alert and oriented x 3.  Psych: Normal affect. Extremities: No clubbing or cyanosis.  HEENT: Normal.    Telemetry   NSR 70s personally checked.   EKG    N/A  Labs    CBC Recent Labs    06/03/22 0024 06/04/22 0026  06/04/22 0921 06/04/22 0926 06/04/22 0929  WBC 8.4 7.1  --   --   --   HGB 13.0 13.2   < > 14.3 14.3  HCT 39.8 39.3   < > 42.0 42.0  MCV 87.7 87.5  --   --   --   PLT 254 242  --   --   --    < > = values in this interval not displayed.   Basic Metabolic Panel Recent Labs    06/04/22 0026 06/04/22 0921 06/04/22 0929 06/05/22 0110  NA 136   < > 141 139  K 4.1   < > 4.1 4.3  CL 102  --   --  106  CO2 26  --   --  26  GLUCOSE 125*  --   --  127*  BUN 26*  --   --  22  CREATININE 1.13*  --   --  1.12*  CALCIUM 9.1  --   --  9.2  MG 2.2  --   --  2.3   < > = values in this interval not displayed.   Liver Function Tests Recent Labs    06/03/22 0024  AST 26  ALT 51*  ALKPHOS 52  BILITOT 0.6  PROT 6.5  ALBUMIN 3.3*   No results for input(s): "LIPASE", "AMYLASE" in the last 72  hours. Cardiac Enzymes No results for input(s): "CKTOTAL", "CKMB", "CKMBINDEX", "TROPONINI" in the last 72 hours.  BNP: BNP (last 3 results) Recent Labs    04/27/22 1255 05/07/22 1116 06/02/22 0045  BNP 739.2* 528.8* 955.7*    ProBNP (last 3 results) No results for input(s): "PROBNP" in the last 8760 hours.   D-Dimer No results for input(s): "DDIMER" in the last 72 hours. Hemoglobin A1C No results for input(s): "HGBA1C" in the last 72 hours. Fasting Lipid Panel No results for input(s): "CHOL", "HDL", "LDLCALC", "TRIG", "CHOLHDL", "LDLDIRECT" in the last 72 hours. Thyroid Function Tests No results for input(s): "TSH", "T4TOTAL", "T3FREE", "THYROIDAB" in the last 72 hours.  Invalid input(s): "FREET3"  Other results:   Imaging    No results found.   Medications:     Scheduled Medications:  apixaban  2.5 mg Oral BID   arformoterol  15 mcg Nebulization BID   budesonide (PULMICORT) nebulizer solution  0.5 mg Nebulization BID   empagliflozin  10 mg Oral Daily   insulin aspart  0-5 Units Subcutaneous QHS   insulin aspart  0-9 Units Subcutaneous TID WC   pantoprazole  40 mg  Oral Daily   pravastatin  10 mg Oral Daily   revefenacin  175 mcg Nebulization Daily   sodium chloride flush  3 mL Intravenous Q12H   sodium chloride flush  3 mL Intravenous Q12H   sodium chloride flush  3 mL Intravenous Q12H   tadalafil  20 mg Oral Daily   torsemide  20 mg Oral Daily    Infusions:  sodium chloride      PRN Medications: sodium chloride, [DISCONTINUED] acetaminophen **OR** acetaminophen, acetaminophen, albuterol, ondansetron (ZOFRAN) IV, ondansetron **OR** [DISCONTINUED] ondansetron (ZOFRAN) IV, senna-docusate, sodium chloride flush    Patient Profile   81 y.o. female with hx of chronic diastolic CHF with RV failure, pulmonary hypertension, chronic respiratory failure on home O2, COPD, hx PE, hx lung cancer, DM II, CKD III.    Admitted with a/c respiratory failure with hypoxia secondary to a/c Chf.   Assessment/Plan   1. RV failure: Echo in 2/23 showed EF 60-65%, mild LVH, D-shaped septum, moderate RV dysfunction and moderate RV enlargement, PASP 90 mmHg, mild-moderate TR.  RHC in 7/23 showed normal filling pressures but severe PAH.  Echo this admission with  EF 55-60%, D-shaped septum, moderate RV enlargement with moderately decreased systolic function, bubble study positive, PASP 64 mmHg. She has been on Lasix 40 mg daily at home.  She was clearly volume overloaded on admit, suspect primarily RV failure based on history. Renal function worsened with diuresis initially but better today with creatinine 1.1. RHC yesterday showed normal RA and PCWP.  Mild volume overload by exam today.  - Start torsemide 20 mg daily.  - Continue Jardiance 10 mg daily.  2 AECOPD: She no longer smokes.  She has advanced emphysema by CT. She is on home oxygen 4-6L.  CXR does not show PNA. Initially she was on Bipap, now on HFNC 9 L which is above her baseline. Seen by pulmonary, not thought to have AECOPD.   - Continuing inhalers.  - Add incentive spirometry.  3. H/o PE: She is on Eliquis.  No bleeding issues. 4. Pulmonary hypertension: Echo in 2/23 showed EF 60-65%, mild LVH, D-shaped septum, moderate RV dysfunction and moderate RV enlargement, PASP 90 mmHg, mild-moderate TR. RHC 7/23 showed normal right and left filling pressures and severe pulmonary arterial hypertension with PVR 10 WU.  V/Q scan not suggestive of chronic  PEs.  Severe emphysema by prior CT.  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 Referred to Dr. Benjamine Mola with Rheumatology. RHC today showed moderate to severe PAH with PVR 8.6 and CI low but not markedly low at 2.1, RA and PCWP were not elevated.  - She is now on tadalafil 20 mg daily.  Will need to make sure she can get as outpatient.  - Would give her trial of Tyvaso for group 1 component as outpatient (avoid potential V/Q mismatch causing worsening hypoxemia with systemic agent).  5. AKI: Creatinine stable today back to baseline 1.1.    6. Positive bubble study: Suspect PFO.  Shunt run done with RHC, no step up. This does not suggest the presence of a significant ASD.   Stable from cardiac standpoint but oxygen saturation still running low.    Loralie Champagne 06/05/2022 9:25 AM

## 2022-06-05 NOTE — Progress Notes (Signed)
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 Palliative care has been asked to get to get involved in the setting of a worsening respiratory state to further address goals of care.   Today's Discussion 06/05/2022  *Please note that this is a verbal dictation therefore any spelling or grammatical errors are due to the "[place] [facility]" system interpretation.  Chart reviewed inclusive of vital signs, progress notes, laboratory results, and diagnostic images.   I met with Brianna Adams at bedside this morning. She and I reviewed that her O2 needs had gone up overnight and the medical team is trying to wean her back down to her home O2 needs. The patient shares she is aware of this and she was very hopeful to leave today though has been told by Dr. Broadus John that she may need to wait another day. I spoke to patient RN about weaning her O2 down as able.   Brianna Adams and I discussed the goals of her getting OOB today.   Created space and opportunity for patient to explore thoughts feelings and fears regarding current medical situation. She shares that she is feeling better overall. She and I reviewed her illness(s) which she is aware of. She and her daughter are going to continue conversations as the pertain to code status and overall goals once she leaves the hospital setting. I asked if Brianna Adams would be interested in an OP Palliative provider to follow along though she was undecided. I shared that if she feels it may help moving forward her primary provider can go about ordering this.   I called patients daughter, Brianna Adams and updated her as to the above conversation.   Questions and concerns addressed/Palliative Support Provided.   Objective Assessment: Vital Signs Vitals:    06/05/22 0020 06/05/22 0600  BP: 98/66 106/73  Pulse: 76 75  Resp: 20 (!) 21  Temp: (!) 97.4 F (36.3 C) (!) 97.4 F (36.3 C)  SpO2: 95% 92%    Intake/Output Summary (Last 24 hours) at 06/05/2022 1100 Last data filed at 06/05/2022 0830 Gross per 24 hour  Intake 1200 ml  Output 1200 ml  Net 0 ml   Last Weight  Most recent update: 06/05/2022 12:21 AM    Weight  60.2 kg (132 lb 11.5 oz)            Gen: Elderly African-American female in no acute distress HEENT: moist mucous membranes CV: Regular rate and rhythm PULM: On 9L nasal cannula breathing is even and unlabored ABD: soft/nontender EXT: No edema Neuro: Alert and oriented x3  SUMMARY OF RECOMMENDATIONS   FULL CODE/full scope of care --> Patient will discuss more with daughter when she leaves the hospital   Patient daughter, Brianna Adams plans to complete [facility] with her mother  Patient is undecided regarding OP Palliative support at this time  Plan will be home with home health once medically optimized per primary team   Ongoing palliative care support  Billing based on MDM: High ______________________________________________________________________________________ [place] Team Team Cell Phone: [phone] Please utilize secure chat with additional questions, if there is no response within 30 minutes please call the above phone number  Palliative Medicine Team providers are available by phone from 7am to 7pm daily and can be  reached through the team cell phone.  Should this patient require assistance outside of these hours, please call the patient's attending physician.     

## 2022-06-05 NOTE — Progress Notes (Signed)
Patient denied increase SOB, however O2 sats are lower 80's with activity in bed using bedpan.O2 sats increased to 90% with oxygen increase to 15 liters.

## 2022-06-06 DIAGNOSIS — Z515 Encounter for palliative care: Secondary | ICD-10-CM | POA: Diagnosis not present

## 2022-06-06 DIAGNOSIS — J9621 Acute and chronic respiratory failure with hypoxia: Secondary | ICD-10-CM | POA: Diagnosis not present

## 2022-06-06 DIAGNOSIS — Z7189 Other specified counseling: Secondary | ICD-10-CM | POA: Diagnosis not present

## 2022-06-06 LAB — BASIC METABOLIC PANEL
Anion gap: 13 (ref 5–15)
BUN: 22 mg/dL (ref 8–23)
CO2: 25 mmol/L (ref 22–32)
Calcium: 9.3 mg/dL (ref 8.9–10.3)
Chloride: 100 mmol/L (ref 98–111)
Creatinine, Ser: 1.09 mg/dL — ABNORMAL HIGH (ref 0.44–1.00)
GFR, Estimated: 51 mL/min — ABNORMAL LOW (ref >60)
Glucose, Bld: 149 mg/dL — ABNORMAL HIGH (ref 70–99)
Potassium: 3.9 mmol/L (ref 3.5–5.1)
Sodium: 138 mmol/L (ref 135–145)

## 2022-06-06 LAB — MAGNESIUM: Magnesium: 2.3 mg/dL (ref 1.7–2.4)

## 2022-06-06 LAB — LIPOPROTEIN A (LPA): Lipoprotein (a): 104.5 nmol/L — ABNORMAL HIGH (ref <75.0)

## 2022-06-06 MED ORDER — TORSEMIDE 20 MG PO TABS: 20.0000 mg | ORAL_TABLET | Freq: Every day | ORAL | 0 refills | Status: DC

## 2022-06-06 MED ORDER — POTASSIUM CHLORIDE CRYS ER 20 MEQ PO TBCR: 20.0000 meq | EXTENDED_RELEASE_TABLET | Freq: Every day | ORAL | 0 refills | Status: DC

## 2022-06-06 NOTE — Progress Notes (Signed)
   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 Palliative care has been asked to get to get involved in the setting of a worsening respiratory state to further address goals of care.   Today's Discussion 06/06/2022  *Please note that this is a verbal dictation therefore any spelling or grammatical errors are due to the "[place] [facility]" system interpretation.  Chart reviewed inclusive of vital signs, progress notes, laboratory results, and diagnostic images.   I met with Shakendra and her daughter, [name] this morning.   We reviewed that per their conversation with Dr. Broadus John the patient would likely be discharging home today once the cardiology team signed off.  Discussed that at this time Arletta  would prefer not to have outpatient palliative support as they do not necessarily see a need for it.  If this is something which is warranted in the future they would prefer to hear this information from their primary care provider.  We reviewed that once [facility] and her mother complete the 5 wishes documents that they can present those to their primary care physician or APP as they would be able to scanned them into the medical records from their.  Questions and concerns addressed/Palliative Support Provided.   Objective Assessment: Vital Signs Vitals:   06/06/22 0807 06/06/22 0823  BP:  (!) 96/59  Pulse:  82  Resp:  (!) 22  Temp:  97.8 F (36.6 C)  SpO2: 94% 91%    Intake/Output Summary (Last 24 hours) at 06/06/2022 1036 Last data filed at 06/06/2022 8315 Gross per 24 hour  Intake 800 ml  Output 3052 ml  Net -2252 ml    Last Weight  Most recent update: 06/06/2022  3:46 AM    Weight  57.2 kg (126 lb 1.7 oz)            Gen: Elderly African-American  female in no acute distress HEENT: moist mucous membranes CV: Regular rate and rhythm PULM: On 4L nasal cannula breathing is even and unlabored ABD: soft/nontender EXT: No edema Neuro: Alert and oriented x3  SUMMARY OF RECOMMENDATIONS   FULL CODE/full scope of care --> Patient will discuss more with daughter when she leaves the hospital   Patient daughter, [name] plans to complete [facility] with her mother  Patient has declined OP Palliative support at this time  Plan will be to transition home with home health   Billing based on MDM: High ______________________________________________________________________________________ [place] Team Team Cell Phone: [phone] Please utilize secure chat with additional questions, if there is no response within 30 minutes please call the above phone number  Palliative Medicine Team providers are available by phone from 7am to 7pm daily and can be reached through the team cell phone.  Should this patient require assistance outside of these hours, please call the patient's attending physician.

## 2022-06-06 NOTE — Progress Notes (Signed)
Patient ID: Brianna Adams, female   DOB: Jun 06, 1941, 81 y.o.   MRN: [id number]   Advanced Heart Failure Rounding Note  PCP-Cardiologist: None   Subjective:    Patient reports her breathing is comfortable at rest.    Creatinine 1.2>1.8>1.13>1.12.>1.09. I/Os negative with torsemide.   Now on HFNC 4L.  Oxygen saturation up and down, currently around 90%.    Echo: EF 55-60%, D-shaped septum, moderate RV enlargement with moderately decreased systolic function, bubble study positive, PASP 64 mmHg.   RHC Procedural Findings: Hemodynamics (mmHg) RA mean 4 RV 69/5 PA 68/23, mean 40 PCWP mean 11 Oxygen saturations: SVC 58% RA 55% RV 58% PA 56% AO 90% Cardiac Output (Fick) 3.35  Cardiac Index (Fick) 2.12 PVR 8.6 WU  Objective:   Weight Range: 57.2 kg Body mass index is 23.06 kg/m.   Vital Signs:   Temp:  [97.3 F (36.3 C)-98.4 F (36.9 C)] 97.8 F (36.6 C) (10/01 0823) Pulse Rate:  [72-82] 82 (10/01 0823) Resp:  [18-24] 22 (10/01 0823) BP: (90-105)/(57-69) 96/59 (10/01 0823) SpO2:  [91 %-95 %] 91 % (10/01 0823) Weight:  [57.2 kg] 57.2 kg (10/01 0343) Last BM Date : 06/05/22  Weight change: Filed Weights   06/04/22 0045 06/05/22 0020 06/06/22 0343  Weight: 57.8 kg 60.2 kg 57.2 kg    Intake/Output:   Intake/Output Summary (Last 24 hours) at 06/06/2022 0901 Last data filed at 06/06/2022 0823 Gross per 24 hour  Intake 800 ml  Output 3052 ml  Net -2252 ml      Physical Exam    General: NAD Neck: JVP 7-8 cm, no thyromegaly or thyroid nodule.  Lungs: Distant BS CV: Nondisplaced PMI.  Heart regular S1/S2, no S3/S4, no murmur.  No peripheral edema.   Abdomen: Soft, nontender, no hepatosplenomegaly, no distention.  Skin: Intact without lesions or rashes.  Neurologic: Alert and oriented x 3.  Psych: Normal affect. Extremities: No clubbing or cyanosis.  HEENT: Normal.   Telemetry   NSR 70s personally checked.   EKG    N/A  Labs    CBC Recent Labs     06/04/22 0026 06/04/22 0921 06/04/22 0926 06/04/22 0929  WBC 7.1  --   --   --   HGB 13.2   < > 14.3 14.3  HCT 39.3   < > 42.0 42.0  MCV 87.5  --   --   --   PLT 242  --   --   --    < > = values in this interval not displayed.   Basic Metabolic Panel Recent Labs    06/05/22 0110 06/06/22 0325  NA 139 138  K 4.3 3.9  CL 106 100  CO2 26 25  GLUCOSE 127* 149*  BUN 22 22  CREATININE 1.12* 1.09*  CALCIUM 9.2 9.3  MG 2.3 2.3   Liver Function Tests No results for input(s): "AST", "ALT", "ALKPHOS", "BILITOT", "PROT", "ALBUMIN" in the last 72 hours.  No results for input(s): "LIPASE", "AMYLASE" in the last 72 hours. Cardiac Enzymes No results for input(s): "CKTOTAL", "CKMB", "CKMBINDEX", "TROPONINI" in the last 72 hours.  BNP: BNP (last 3 results) Recent Labs    04/27/22 1255 05/07/22 1116 06/02/22 0045  BNP 739.2* 528.8* 955.7*    ProBNP (last 3 results) No results for input(s): "PROBNP" in the last 8760 hours.   D-Dimer No results for input(s): "DDIMER" in the last 72 hours. Hemoglobin A1C No results for input(s): "HGBA1C" in the last 72 hours. Fasting Lipid Panel  No results for input(s): "CHOL", "HDL", "LDLCALC", "TRIG", "CHOLHDL", "LDLDIRECT" in the last 72 hours. Thyroid Function Tests No results for input(s): "TSH", "T4TOTAL", "T3FREE", "THYROIDAB" in the last 72 hours.  Invalid input(s): "FREET3"  Other results:   Imaging    No results found.   Medications:     Scheduled Medications:  apixaban  2.5 mg Oral BID   arformoterol  15 mcg Nebulization BID   budesonide (PULMICORT) nebulizer solution  0.5 mg Nebulization BID   empagliflozin  10 mg Oral Daily   insulin aspart  0-5 Units Subcutaneous QHS   insulin aspart  0-9 Units Subcutaneous TID WC   pantoprazole  40 mg Oral Daily   potassium chloride  20 mEq Oral Daily   pravastatin  10 mg Oral Daily   revefenacin  175 mcg Nebulization Daily   sodium chloride flush  3 mL Intravenous Q12H    sodium chloride flush  3 mL Intravenous Q12H   sodium chloride flush  3 mL Intravenous Q12H   tadalafil  20 mg Oral Daily   torsemide  20 mg Oral Daily    Infusions:  sodium chloride      PRN Medications: sodium chloride, [DISCONTINUED] acetaminophen **OR** acetaminophen, acetaminophen, albuterol, ondansetron (ZOFRAN) IV, ondansetron **OR** [DISCONTINUED] ondansetron (ZOFRAN) IV, senna-docusate, sodium chloride flush    Patient Profile   81 y.o. female with hx of chronic diastolic CHF with RV failure, pulmonary hypertension, chronic respiratory failure on home O2, COPD, hx PE, hx lung cancer, DM II, CKD III.    Admitted with a/c respiratory failure with hypoxia secondary to a/c Chf.   Assessment/Plan   1. RV failure: Echo in 2/23 showed EF 60-65%, mild LVH, D-shaped septum, moderate RV dysfunction and moderate RV enlargement, PASP 90 mmHg, mild-moderate TR.  RHC in 7/23 showed normal filling pressures but severe PAH.  Echo this admission with  EF 55-60%, D-shaped septum, moderate RV enlargement with moderately decreased systolic function, bubble study positive, PASP 64 mmHg. She has been on Lasix 40 mg daily at home.  She was clearly volume overloaded on admit, suspect primarily RV failure based on history. Renal function worsened with diuresis initially but better today with creatinine 1.09. RHC yesterday showed normal RA and PCWP.  Volume status looks stable.  - Continue torsemide 20 mg daily.  - Continue Jardiance 10 mg daily.  2 AECOPD: She no longer smokes.  She has advanced emphysema by CT. She is on home oxygen 4-6L.  CXR does not show PNA. Initially she was on Bipap, now on HFNC 9 L which is above her baseline. Seen by pulmonary, not thought to have AECOPD.   - Continuing inhalers.  - incentive spirometry.  3. H/o PE: She is on Eliquis. No bleeding issues. 4. Pulmonary hypertension: Echo in 2/23 showed EF 60-65%, mild LVH, D-shaped septum, moderate RV dysfunction and moderate RV  enlargement, PASP 90 mmHg, mild-moderate TR. RHC 7/23 showed normal right and left filling pressures and severe pulmonary arterial hypertension with PVR 10 WU.  V/Q scan not suggestive of chronic PEs.  Severe emphysema by prior CT.  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 Referred to Dr. Benjamine Mola with Rheumatology. RHC today showed moderate to severe PAH with PVR 8.6 and CI low but not markedly low at 2.1, RA and PCWP were not elevated.  - She is now on  tadalafil 20 mg daily.  Will need to make sure she can get as outpatient.  - Would give her trial of Tyvaso for group 1 component as outpatient (avoid potential V/Q mismatch causing worsening hypoxemia with systemic agent).  5. AKI: Creatinine stable today back to baseline 1.09.    6. Positive bubble study: Suspect PFO.  Shunt run done with RHC, no step up. This does not suggest the presence of a significant ASD.   OK for home from my standpoint, needs followup in CHF clinic.  Cardiac meds for home: torsemide 20 mg daily, KCl 20 daily, Jardiance 10 daily, tadalafil 20 daily, apixaban 2.5 bid, pravastatin 10 daily.   Loralie Champagne 06/06/2022 9:01 AM

## 2022-06-06 NOTE — Discharge Summary (Signed)
Physician Discharge Summary  Brianna Adams FYB:[id number] DOB: 1941-01-02 DOA: 06/02/2022  PCP: [name], [name]  Admit date: 06/02/2022 Discharge date: 06/06/2022  Time spent: 45 minutes  Recommendations for Outpatient Follow-up:  Heart failure clinic Dr. 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 History of pulmonary embolism   Type 2 diabetes mellitus with stage 3a chronic kidney disease ([place])   Pulmonary hypertension, unspecified ([place])   Pulmonary embolism (HCC)   Acute on chronic heart failure with preserved ejection fraction (HFpEF) ([place])   Acute renal failure superimposed on stage 3a chronic kidney disease ([place])   Discharge Condition: Stable  Diet recommendation: Low-sodium, diabetic  Filed Weights   06/04/22 0045 06/05/22 0020 06/06/22 0343  Weight: 57.8 kg 60.2 kg 57.2 kg    History of present illness:  81/F with history of COPD/emphysema on 4 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  Presented to the ED with worsening dyspnea on exertion, O2 sats in the 70s recently, required BiPAP on admission now weaned down to 11 L high flow, labs noted creatinine of 1.3, BNP 955, chest x-ray with prominent central pulmonary arteries and interstitial coarsening  Hospital Course:   Acute on chronic diastolic CHF, RV failure Severe PAH -Last echo 2/23 with EF 60-65%, moderate RV dysfunction, PASP of 90 mmHg -Volume overloaded on admission, seen by heart failure team, diuresed with IV Lasix, now felt to be euvolemic, underwent right heart cath 9/29 which noted normal wedge pressures -Now felt to be euvolemic, torsemide 20 Mg  daily resumed along with Jardiance -Overall prognosis is poor with extremely limited reserve, advanced COPD as well, seen by palliative care, remains full code, plan for palliative care follow-up after discharge -Oxygen requirement now improved to 5 L which is around her baseline of 4 L at rest and 6 L with activity   Acute hypoxic respiratory failure COPD/advanced emphysema -Has underlying advanced emphysema and postradiation lung scarring -History of PE on Eliquis, with compliance -With a component of CHF exacerbation as well, as above -On 4 to 6 L O2 at baseline recently, improved -Appreciate pulmonary input, inhalers optimized, not felt to have COPD exacerbation -Discharged home in stable condition, palliative care follow-up recommended   History of pulmonary embolism -Continue Eliquis   Type 2 diabetes mellitus -A1c was 6.7 in 5/23, continue Jardiance   AKI on CKD 3a -Now improved  Procedures:RHC 9/29 showed normal RA and PCWP  Consultations: Heart failure team, pulmonary, palliative care  Discharge Exam: Vitals:   06/06/22 0807 06/06/22 0823  BP:  (!) 96/59  Pulse:  82  Resp:  (!) 22  Temp:  97.8 F (36.6 C)  SpO2: 94% 91%   General exam: Sent elderly female chronically ill sitting up in bed, AAOx3, no distress HEENT: No JVD CVS: S1-S2, regular rhythm Lungs: Decreased breath sounds to bases otherwise clear Abdomen: Soft, nontender, bowel sounds present Extremities: No edema  Skin: No rashes Psychiatry:  Mood & affect appropriate.    Discharge Instructions   Discharge Instructions     Diet - low sodium heart healthy   Complete by: As directed    Increase activity slowly   Complete by: As directed  Allergies as of 06/06/2022       Reactions   Ace Inhibitors Swelling   Penicillin G Hives, Shortness Of Breath, Rash   Zestril [lisinopril] Swelling   Cortisone Other (See Comments)   Unknown reaction   Hydrocortisone Other (See Comments)    Unknown reaction   Lipitor [atorvastatin] Other (See Comments)   Myalgias (Muscle Pain) in high dosages    Mobic [meloxicam] Other (See Comments)   Bloody stools   Pravachol [pravastatin] Other (See Comments)   Chest Pain if high dosage         Medication List     STOP taking these medications    furosemide 20 MG tablet Commonly known as: LASIX   losartan 25 MG tablet Commonly known as: COZAAR       TAKE these medications    acetaminophen 500 MG tablet Commonly known as: TYLENOL Take 1,000 mg by mouth every 6 (six) hours as needed for mild pain or fever.   albuterol 108 (90 Base) MCG/ACT inhaler Commonly known as: VENTOLIN HFA TAKE 2 PUFFS BY MOUTH EVERY 6 HOURS AS NEEDED FOR WHEEZE OR SHORTNESS OF BREATH   albuterol (2.5 MG/3ML) 0.083% nebulizer solution Commonly known as: PROVENTIL Take 3 mLs (2.5 mg total) by nebulization every 6 (six) hours as needed for wheezing or shortness of breath.   apixaban 2.5 MG Tabs tablet Commonly known as: Eliquis Take 1 tablet (2.5 mg total) by mouth 2 (two) times daily.   ascorbic acid 500 MG tablet Commonly known as: VITAMIN C Take 500 mg by mouth every Tuesday, Thursday, Saturday, and Sunday. In the morning.   Breztri Aerosphere 160-9-4.8 MCG/ACT Aero Generic drug: Budeson-Glycopyrrol-Formoterol Inhale 2 puffs into the lungs in the morning and at bedtime.   CALCIUM PO Take 1,200 mg by mouth in the morning.   CENTRUM SILVER 50+WOMEN PO Take 1 tablet by mouth in the morning.   beta carotene w/minerals tablet Take 1 tablet by mouth in the morning.   Cinnamon 500 MG Tabs Take 1,000 mg by mouth at bedtime.   ferrous sulfate 325 (65 FE) MG EC tablet Take 1 tablet (325 mg total) by mouth daily.   Jardiance 10 MG Tabs tablet Generic drug: empagliflozin TAKE 1 TABLET BY MOUTH EVERY DAY BEFORE BREAKFAST   metFORMIN 1000 MG tablet Commonly known as: GLUCOPHAGE TAKE 1 TABLET (1,000 MG TOTAL) BY MOUTH 2 (TWO) TIMES DAILY  WITH A MEAL.   MUCINEX D PO Take 5 mLs by mouth at bedtime.   omeprazole 40 MG capsule Commonly known as: PRILOSEC TAKE 1 CAPSULE BY MOUTH EVERY DAY   potassium chloride SA 20 MEQ tablet Commonly known as: KLOR-CON M Take 1 tablet (20 mEq total) by mouth daily.   pravastatin 10 MG tablet Commonly known as: PRAVACHOL TAKE 1 TABLET BY MOUTH EVERY DAY   Spacer/Aero-Holding Dorise Bullion Use with inhaler   tadalafil (PAH) 20 MG tablet Commonly known as: ADCIRCA Take 1 tablet (20 mg total) by mouth daily.   torsemide 20 MG tablet Commonly known as: DEMADEX Take 1 tablet (20 mg total) by mouth daily.   vitamin E 180 MG (400 UNITS) capsule Take 400 Units by mouth every Monday, Wednesday, and Friday. In the morning.       Allergies  Allergen Reactions   Ace Inhibitors Swelling   Penicillin G Hives, Shortness Of Breath and Rash   Zestril [Lisinopril] Swelling   Cortisone Other (See Comments)    Unknown reaction   Hydrocortisone Other (See Comments)  Unknown reaction   Lipitor [Atorvastatin] Other (See Comments)    Myalgias (Muscle Pain) in high dosages     Mobic [Meloxicam] Other (See Comments)    Bloody stools   Pravachol [Pravastatin] Other (See Comments)    Chest Pain if high dosage     Follow-up Information     [facility] Follow up on 06/11/2022.   Specialty: Cardiology Why: at 2:40 with Dr Lavell Islam information: [address] [phone] [place], [place] Follow up.   Why: for home health services Contact information: [address] [address] [address] [phone]                  The results of significant diagnostics from this hospitalization (including imaging, microbiology, ancillary and laboratory) are listed below for reference.    Significant Diagnostic Studies: CARDIAC  CATHETERIZATION  Result Date: 06/04/2022 1. Normal filling pressures. 2. Moderate to severe pulmonary arterial hypertension. 3. Low but not markedly low cardiac output. 4. No evidence for significant left to right shunt by shunt run.   ECHOCARDIOGRAM LIMITED BUBBLE STUDY  Result Date: 06/03/2022    ECHOCARDIOGRAM LIMITED REPORT   Patient Name:   Brianna Adams Date of Exam: 06/03/2022 Medical Rec #:  [id number]     Height:       62.0 in Accession #:    [id number]    Weight:       124.3 lb Date of Birth:  [birth year]      BSA:          1.562 m Patient Age:    41 years      BP:           106/73 mmHg Patient Gender: F             HR:           82 bpm. Exam Location:  Inpatient Procedure: Limited Echo, Limited Color Doppler and Cardiac Doppler Indications:     ASD (atrial septal defect) 745.5 / Q21.1  History:         Patient has prior history of Echocardiogram examinations, most                  recent 10/08/2021. CHF, COPD and Pulmonary HTN,                  Signs/Symptoms:Dyspnea; Risk Factors:Diabetes and GERD.  Sonographer:     Bernadene Person RDCS Referring Phys:  Ina Homes, C Diagnosing Phys: [place] IMPRESSIONS  1. Left ventricular ejection fraction, by estimation, is 55 to 60%. The left ventricle has normal function. There is the interventricular septum is flattened in systole and diastole, consistent with right ventricular pressure and volume overload.  2. Right ventricular systolic function is moderately reduced. The right ventricular size is moderately enlarged. There is severely elevated pulmonary artery systolic pressure.  3. R-L shunt with moderate bubbles~ 3-5 cardiac cycles, c/f interatrial shunt. The septum is not well visualzed.  4. The inferior vena cava is dilated in size with >50% respiratory variability, suggesting right atrial pressure of 8 mmHg. Conclusion(s)/Recommendation(s): Can consider TEE for evaluation for interatrial shunt however with significant PA pressures/RV failure, she is  not a candidate for ASD closure. Consider shunt run with RHC. FINDINGS  Left Ventricle: Left ventricular ejection fraction, by estimation, is 55 to 60%. The left ventricle has normal  function. The interventricular septum is flattened in systole and diastole, consistent with right ventricular pressure and volume overload. Right Ventricle: The right ventricular size is moderately enlarged. Right ventricular systolic function is moderately reduced. There is severely elevated pulmonary artery systolic pressure. The tricuspid regurgitant velocity is 3.74 m/s, and with an assumed right atrial pressure of 8 mmHg, the estimated right ventricular systolic pressure is 75.9 mmHg. Venous: The inferior vena cava is dilated in size with greater than 50% respiratory variability, suggesting right atrial pressure of 8 mmHg.  LV Volumes (MOD) LV vol d, MOD A2C: 52.6 ml LV vol d, MOD A4C: 45.2 ml LV vol s, MOD A2C: 27.7 ml LV vol s, MOD A4C: 18.7 ml LV SV MOD A2C:     24.9 ml LV SV MOD A4C:     45.2 ml LV SV MOD BP:      26.0 ml RIGHT ATRIUM           Index RA Area:     22.30 cm RA Volume:   71.90 ml  46.03 ml/m  TRICUSPID VALVE TR Peak grad:   56.0 mmHg TR Vmax:        374.00 cm/s Brianna Adams Electronically signed by Brianna Adams Signature Date/Time: 06/03/2022/10:17:45 AM    Final (Updated)    VAS [place] LOWER EXTREMITY VENOUS (DVT)  Result Date: 06/02/2022  Lower Venous DVT Study Patient Name:  Brianna Adams  Date of Exam:   06/02/2022 Medical Rec #: [id number]      Accession #:    [id number] Date of Birth: 07-16-1941       Patient Gender: F Patient Age:   47 years Exam Location:  [facility] Procedure:      VAS [place] LOWER EXTREMITY VENOUS (DVT) Referring Phys: Ina Homes --------------------------------------------------------------------------------  Indications: Edema.  Comparison Study: no prior Performing Technologist: Archie Patten RVS  Examination Guidelines: A complete evaluation includes B-mode imaging, spectral  Doppler, color Doppler, and power Doppler as needed of all accessible portions of each vessel. Bilateral testing is considered an integral part of a complete examination. Limited examinations for reoccurring indications may be performed as noted. The reflux portion of the exam is performed with the patient in reverse Trendelenburg.  +---------+---------------+---------+-----------+----------+--------------+ RIGHT    CompressibilityPhasicitySpontaneityPropertiesThrombus Aging +---------+---------------+---------+-----------+----------+--------------+ CFV      Full           Yes      Yes                                 +---------+---------------+---------+-----------+----------+--------------+ SFJ      Full                                                        +---------+---------------+---------+-----------+----------+--------------+ FV Prox  Full                                                        +---------+---------------+---------+-----------+----------+--------------+ FV Mid   Full                                                        +---------+---------------+---------+-----------+----------+--------------+  FV DistalFull                                                        +---------+---------------+---------+-----------+----------+--------------+ PFV      Full                                                        +---------+---------------+---------+-----------+----------+--------------+ POP      Full           Yes      Yes                                 +---------+---------------+---------+-----------+----------+--------------+ PTV      Full                                                        +---------+---------------+---------+-----------+----------+--------------+ PERO     Full                                                        +---------+---------------+---------+-----------+----------+--------------+    +---------+---------------+---------+-----------+----------+--------------+ LEFT     CompressibilityPhasicitySpontaneityPropertiesThrombus Aging +---------+---------------+---------+-----------+----------+--------------+ CFV      Full           Yes      Yes                                 +---------+---------------+---------+-----------+----------+--------------+ SFJ      Full                                                        +---------+---------------+---------+-----------+----------+--------------+ FV Prox  Full                                                        +---------+---------------+---------+-----------+----------+--------------+ FV Mid   Full                                                        +---------+---------------+---------+-----------+----------+--------------+ FV DistalFull                                                        +---------+---------------+---------+-----------+----------+--------------+  PFV      Full                                                        +---------+---------------+---------+-----------+----------+--------------+ POP      Full           Yes      Yes                                 +---------+---------------+---------+-----------+----------+--------------+ PTV      Full                                                        +---------+---------------+---------+-----------+----------+--------------+ PERO     Full                                                        +---------+---------------+---------+-----------+----------+--------------+     Summary: BILATERAL: - No evidence of deep vein thrombosis seen in the lower extremities, bilaterally. -No evidence of popliteal cyst, bilaterally.   *See table(s) above for measurements and observations. Electronically signed by Harold Barban MD on 06/02/2022 at 9:18:02 PM.    Final    DG Chest Port 1 View  Result Date: 06/02/2022 CLINICAL  DATA:  Dyspnea EXAM: PORTABLE CHEST 1 VIEW COMPARISON:  Radiographs 03/22/2022 FINDINGS: Stable linear scarring in the left lateral mid lung. Postoperative changes in the right lung. No pleural effusion or pneumothorax. Stable borderline cardiomegaly. Aortic atherosclerotic calcification. Prominent central pulmonary arteries. Diffuse interstitial coarsening. No acute osseous abnormality. IMPRESSION: 1. No acute cardiopulmonary process. 2. Prominent central pulmonary arteries possibly due to pulmonary hypertension. 3. Chronic interstitial coarsening. Electronically Signed   By: Placido Sou M.D.   On: 06/02/2022 01:03    Microbiology: No results found for this or any previous visit (from the past 240 hour(s)).   Labs: Basic Metabolic Panel: Recent Labs  Lab 06/02/22 0352 06/03/22 0024 06/04/22 0026 06/04/22 3546 06/04/22 5681 06/04/22 0926 06/04/22 0929 06/05/22 0110 06/06/22 0325  NA 136 136 136   < > 141 141 141 139 138  K 4.8 3.9 4.1   < > 4.1 4.1 4.1 4.3 3.9  CL 104 101 102  --   --   --   --  106 100  CO2 21* 26 26  --   --   --   --  26 25  GLUCOSE 170* 159* 125*  --   --   --   --  127* 149*  BUN 25* 30* 26*  --   --   --   --  22 22  CREATININE 1.21* 1.84* 1.13*  --   --   --   --  1.12* 1.09*  CALCIUM 9.5 9.3 9.1  --   --   --   --  9.2 9.3  MG 1.7 2.0 2.2  --   --   --   --  2.3 2.3   < > =  values in this interval not displayed.   Liver Function Tests: Recent Labs  Lab 06/02/22 0045 06/03/22 0024  AST 50* 26  ALT 69* 51*  ALKPHOS 56 52  BILITOT 0.7 0.6  PROT 7.3 6.5  ALBUMIN 3.8 3.3*   No results for input(s): "LIPASE", "AMYLASE" in the last 168 hours. No results for input(s): "AMMONIA" in the last 168 hours. CBC: Recent Labs  Lab 06/02/22 0045 06/02/22 0057 06/02/22 0352 06/03/22 0024 06/04/22 0026 06/04/22 0921 06/04/22 0924 06/04/22 0926 06/04/22 0929  WBC 6.3  --  5.6 8.4 7.1  --   --   --   --   NEUTROABS 3.5  --   --   --   --   --   --   --    --   HGB 13.7   < > 13.0 13.0 13.2 14.3  14.6 14.3 14.3 14.3  HCT 43.7   < > 40.3 39.8 39.3 42.0  43.0 42.0 42.0 42.0  MCV 91.8  --  88.2 87.7 87.5  --   --   --   --   PLT 260  --  251 254 242  --   --   --   --    < > = values in this interval not displayed.   Cardiac Enzymes: No results for input(s): "CKTOTAL", "CKMB", "CKMBINDEX", "TROPONINI" in the last 168 hours. BNP: BNP (last 3 results) Recent Labs    04/27/22 1255 05/07/22 1116 06/02/22 0045  BNP 739.2* 528.8* 955.7*    ProBNP (last 3 results) No results for input(s): "PROBNP" in the last 8760 hours.  CBG: Recent Labs  Lab 06/04/22 1609 06/04/22 2145 06/05/22 0600 06/05/22 1105 06/05/22 1605  GLUCAP 129* 174* 144* 111* 177*       Signed:  Domenic Polite MD.  [facility] 06/06/2022, 9:22 AM

## 2022-06-06 NOTE — Progress Notes (Signed)
Patient given discharge instructions along with daughter at bedside. Both verbalize understanding. All belongings with patient and TOC meds given.

## 2022-06-07 ENCOUNTER — Telehealth: Payer: Self-pay

## 2022-06-07 ENCOUNTER — Encounter: Payer: Self-pay | Admitting: Nurse Practitioner

## 2022-06-07 NOTE — Patient Outreach (Signed)
  Care Coordination TOC Note Transition Care Management Follow-up Telephone Call Date of discharge and from where: 06/06/22-[facility] or chronic resp failures How have you been since you were released from the hospital? Spoke with daughter-Benita. Se reports patient is still resting. Patient was complaining of just being very tired and not getting enough rest/sleep while in the hospital.  Any questions or concerns? No  Items Reviewed: Did the pt receive and understand the discharge instructions provided? Yes  Medications obtained and verified? Yes  Other? Yes -HH services Any new allergies since your discharge? No  Dietary orders reviewed? Yes-low salt/heart healthy. Daughter stets that patient drinks at least 4 cups of caffienated coffee per day. Discussed importance of lowering daily caffeine intake-will discuss with MD as well. Do you have support at home? Yes -Daughter as well as church members very involved  Keys and Equipment/Supplies: Were home health services ordered? yes If so, what is the name of the agency? [facility]  Has the agency set up a time to come to the patient's home? no Were any new equipment or medical supplies ordered?  No What is the name of the medical supply agency? N/a Were you able to get the supplies/equipment? not applicable Do you have any questions related to the use of the equipment or supplies? No  Functional Questionnaire: (I = Independent and D = Dependent) ADLs: Assist  Bathing/Dressing- Assist  Meal Prep- Assist  Eating- I  Maintaining continence- I  Transferring/Ambulation- I  Managing Meds- I  Follow up appointments reviewed:  PCP Hospital f/u appt confirmed?  Daughter will call office today to make an appt-pt just discharged yesterday  . [facility] f/u appt confirmed?  Scheduled to see Dr. [name] 06/11/22 -2:40pm  Dr. Lacinda Axon 06/09/22- 11am. Are transportation arrangements needed? No  If their condition worsens,  is the pt aware to call PCP or go to the Emergency Dept.? Yes Was the patient provided with contact information for the PCP's office or ED? Yes Was to pt encouraged to call back with questions or concerns? Yes  SDOH assessments and interventions completed:   Yes  Care Coordination Interventions Activated:  yes   Care Coordination Interventions:  Education provided    Encounter Outcome:  Pt. Visit Completed    Enzo Montgomery, RN,BSN,CCM [place] Telephonic Care Management Coordinator Direct Phone: [phone] Toll Free: [phone] Fax: [phone]

## 2022-06-08 ENCOUNTER — Telehealth: Payer: Self-pay

## 2022-06-09 ENCOUNTER — Ambulatory Visit (INDEPENDENT_AMBULATORY_CARE_PROVIDER_SITE_OTHER): Payer: Medicare Other | Admitting: Nurse Practitioner

## 2022-06-09 ENCOUNTER — Encounter: Payer: Self-pay | Admitting: Nurse Practitioner

## 2022-06-09 DIAGNOSIS — C3490 Malignant neoplasm of unspecified part of unspecified bronchus or lung: Secondary | ICD-10-CM

## 2022-06-09 DIAGNOSIS — I2609 Other pulmonary embolism with acute cor pulmonale: Secondary | ICD-10-CM

## 2022-06-09 DIAGNOSIS — I272 Pulmonary hypertension, unspecified: Secondary | ICD-10-CM

## 2022-06-09 DIAGNOSIS — J9611 Chronic respiratory failure with hypoxia: Secondary | ICD-10-CM

## 2022-06-09 DIAGNOSIS — J4489 Other specified chronic obstructive pulmonary disease: Secondary | ICD-10-CM | POA: Diagnosis not present

## 2022-06-09 DIAGNOSIS — J439 Emphysema, unspecified: Secondary | ICD-10-CM

## 2022-06-09 DIAGNOSIS — I5032 Chronic diastolic (congestive) heart failure: Secondary | ICD-10-CM

## 2022-06-09 NOTE — Assessment & Plan Note (Signed)
History of pulmonary embolism on lifelong anticoagulation with Eliquis.  No excessive bleeding/bruising.

## 2022-06-09 NOTE — Assessment & Plan Note (Signed)
Euvolemic on exam.  See above plan.

## 2022-06-09 NOTE — Assessment & Plan Note (Signed)
No evidence of recurrence on recent CT.  She last saw oncology in August.   

## 2022-06-09 NOTE — Patient Instructions (Addendum)
-  Continue Breztri 2 puffs Twice daily with spacer. Brush tongue and rinse mouth afterwards. -Continue Albuterol inhaler 2 puffs or 3 mL neb every 6 hours as needed for shortness of breath or wheezing. Notify if symptoms persist despite rescue inhaler/neb use. -Continue Eliquis 1 tab Twice daily. Monitor for bleeding or excessive bruising  -Continue torsemide 1 tab daily. Monitor your weight at the same time daily; notify if you have 2-3 lb weight gain in 1 day or 5 lb in a week  -Continue tadalafil 1 tab daily  -Continue supplemental oxygen 4 lpm for goal oxygen level >88-90%. Monitor O2 levels during activity; ok to increase to 6 lpm if needed but you were able to maintain on 4 lpm today   Follow up with cardiology as scheduled    Follow up in 6 weeks with Dr. [name] or Katie Ahren Pettinger,NP. If symptoms do not improve or worsen, please contact office for sooner follow up or seek emergency care.

## 2022-06-09 NOTE — Assessment & Plan Note (Signed)
She is currently maintained on triple therapy with Breztri.  Previously had good response to this when we changed her from Trelegy in May.  Clinically stable today.  No changes to inhaler regimen.

## 2022-06-09 NOTE — Assessment & Plan Note (Signed)
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 She is waiting on rheumatology referral. Recently hospitalized for acute on chronic respiratory failure related to volume overload from CHF exacerbation/severe PAH/RV failure.  She is clinically improved.  Looks good today in office.  Currently on tadalafil and torsemide.  She will see Dr. [name] on Friday with the advanced heart failure clinic.  Patient Instructions  -Continue Breztri 2 puffs Twice daily with spacer. Brush tongue and rinse mouth afterwards. -Continue Albuterol inhaler 2 puffs or 3 mL neb every 6 hours as needed for shortness of breath or wheezing. Notify if symptoms persist despite rescue inhaler/neb use. -Continue Eliquis 1 tab Twice daily. Monitor for bleeding or excessive bruising  -Continue torsemide 1 tab daily. Monitor your weight at the same time daily; notify if you have 2-3 lb weight gain in 1 day or 5 lb in a week  -Continue tadalafil 1 tab daily  -Continue supplemental oxygen 4 lpm for goal oxygen level >88-90%. Monitor O2 levels during activity; ok to increase to 6 lpm if needed but you were able to maintain on 4 lpm today   Follow up with cardiology as scheduled    Follow up in 6 weeks with Dr. [name] or Katie Markian Glockner,NP. If symptoms do not improve or worsen, please contact office for sooner follow up or seek emergency care.

## 2022-06-09 NOTE — Assessment & Plan Note (Signed)
During her hospital stay, she required up to 10 L/min but was able to be titrated down to 4 L/min rest and 6 L minute with activity at discharge.  We trialed her back on her baseline of 4 L/min today given her clinical improvement.  She was able to maintain oxygen saturations greater than 96% at rest and with activity.  Instructed her that she could decrease back to this and to monitor O2 levels at home for goal greater than 88 to 90%.  Advised that she could return to her exercise programs but to use caution and monitor her oxygen levels closely.

## 2022-06-09 NOTE — Progress Notes (Unsigned)
_0  ID: Brianna Adams, female    DOB: [birth year], 81 y.o.   MRN: [id number]  Chief Complaint  Patient presents with   Follow-up    Pt HFU she feels better she is on 6L O2 continuous. Unsure if Brianna Adams is helping. She reports having SOB (no cough/wheeze)    Referring provider: [name], [name]  HPI: 81 year old female, former smoker (41 pack years) followed for COPD with chronic bronchitis and emphysema, chronic respiratory failure. She is a patient of Dr. Patsey Adams and last seen in office on 05/19/2022. She has a past history of lung cancer s/p RLL resection May 2018, and 2019 radiation to new upper lobe nodule. Past medical history significant for PE on Eliquis, HTN, chronic diastolic CHF, GERD, DM II, CKD stage II, HLD, IDA, multinodular goiter. 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  TEST/EVENTS:  01/16/2021 CT chest wo contrast: overall the CT chest is stable without evidence of local recurrence or metastatic disease. There is stable radiation fibrosis in the lingula with adjacent chronic fx of the left 5th rib. Stable postsurgical changes in the RLL. There are stable borderline enlarged mediastinal and hilar lymph nodes. Atherosclerosis. Heterogenous enlargement of the thyroid gland, consistent with multinodular goiter. Thyroid US advised.   01/20/2022 CT chest without contrast: The right and left pulmonary arteries are enlarged.  There is prominent mediastinal and hilar lymph nodes which are stable when compared to prior CT.  There is a stable left upper lobe, lingula irregular discoid opacity, consistent with postradiation scarring.  There are postsurgical changes in the right lower lobe with adjacent nodular/discoid scarring which is also stable.  There is advanced emphysema present.  There is an unchanged left lateral rib fracture. 03/22/2022 perfusion scan: chronic linear scarring on the  left 06/02/2022 CXR: there is chronic interstitial coarsening, otherwise liungs are clear. Prominent central pulmonary arteries and stable borderline cardiomegaly.  12/24/2021: OV with Brianna Spanbauer NP for worsening DOE over the past month. Feels as though her activity tolerance has declined and she is unable to walk as far as she used to. Also has trouble completing daily tasks around the house. She has not been monitoring her oxygen levels at home but they did increase her home use to 5 lpm due to her shortness of breath. She continues on 4 lpm today with oxygen saturation 100%. AECOPD vs progression -  treated with taper. Advised trial change from Trelegy to [facility].   02/16/2022: OV with [name] NP for follow up with her daughter. She did not try switching to the [place] until sometime in May, due to her daughter being out of town, but she has felt a noticeable difference when compared to the Trelegy. Reports feeling better after our last visit with improved activity tolerance. She still does have some DOE at baseline and gets winded with longer distances and climbing. She denies recent cough, wheeze, hemoptysis, weight loss, anorexia.  She is on 4 L/min continuous supplemental O2, which is her baseline.  She is followed by cardiology who referred her to the Advanced HF clinic for her [place]. They report that they have not heard anything to get this scheduled.   05/19/2022: OV with Dr. Patsey Adams. Worsening DOE and increased O2 requirement with volume overload. Instructed to increase lasix. Titrate O2 to 6 lpm with activity and hold off on attending Silver Sneakers while O2 requirements increased. Close follow up.  06/09/2022: Today -  hospital follow up Patient presents today with her daughter for hospital follow-up.  She was admitted from 06/02/2022 to 06/06/2022 due to acute on chronic respiratory failure related to acute CHF exacerbation and severe pulmonary hypertension.  She had significant increase in O2 requirements,  up to 10 L/min, still with desaturations to the 80s.  She was treated with IV diuretics and had significant improvement in her symptoms.  She underwent right heart cath prior to discharge which showed improvement in her pulmonary artery pressures.  She was transitioned to tadalafil and torsemide by cardiology.  She was discharged on 5 L minute supplemental O2. Today, she reports feeling significantly better.  She feels like her breathing is better than it has been.  She has not had any further swelling since she left the hospital.  She denies any cough, wheeze, orthopnea, palpitations, weight gain, hemoptysis.  She is still using her Breztri inhaler.  Feels like this works well for her.  She is now on tadalafil and torsemide for PAH.  She is scheduled to see Dr. Aundra Adams with the advanced heart failure clinic on Friday.  She has been titrating her oxygen to 6 L/min with activity and then down to 4 L/min at rest, when she remembers, which is what she was instructed to do prior to her hospitalization.  This is increased from her baseline which is usually 4 L/min with activity and rest. She wants to know if she can go back to silver sneakers.   Allergies  Allergen Reactions   Ace Inhibitors Swelling   Penicillin G Hives, Shortness Of Breath and Rash   Zestril [Lisinopril] Swelling   Cortisone Other (See Comments)    Unknown reaction   Hydrocortisone Other (See Comments)    Unknown reaction   Lipitor [Atorvastatin] Other (See Comments)    Myalgias (Muscle Pain) in high dosages     Mobic [Meloxicam] Other (See Comments)    Bloody stools   Pravachol [Pravastatin] Other (See Comments)    Chest Pain if high dosage     Immunization History  Administered Date(s) Administered   Fluad Quad(high Dose 65+) 05/24/2019, 05/13/2020   Influenza, High Dose Seasonal PF 06/01/2021   Influenza-Unspecified 06/06/2018   PFIZER(Purple Top)SARS-COV-2 Vaccination 10/20/2019, 11/19/2019, 06/11/2020   Pfizer Covid-19  Vaccine Bivalent Booster 45yr & up 06/01/2021   Pneumococcal Conjugate-13 10/31/2013   Pneumococcal Polysaccharide-23 12/05/1997, 11/05/2007, 04/07/2015   Td 09/07/2003   Tdap 08/07/2015, 08/17/2017   Zoster Recombinat (Shingrix) 08/17/2017, 06/28/2019, 09/05/2019   Zoster, Live 11/05/2010    Past Medical History:  Diagnosis Date   CHF (congestive heart failure) (HCC)    COPD (chronic obstructive pulmonary disease) (HCC)    Diabetes ([facility]    type 2   Diverticulitis    Dyspnea    On Oxygen 3L via [place]    GERD (gastroesophageal reflux disease)    High cholesterol    Lung cancer ([facility]    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   PE (pulmonary thromboembolism) ([facility] 02/29/2020   Pulmonary hypertension ([place]    Uterine cancer ([facility]     Tobacco History: Social History   Tobacco Use  Smoking Status Former   Packs/day: 1.00   Years: 58.00   Total pack years: 58.00   Types: Cigarettes   Quit date: 10/22/2015   Years since quitting: 6.6   Passive exposure: Past  Smokeless Tobacco Never  Tobacco Comments   congratulated   Counseling given: Not Answered Tobacco comments: congratulated   Outpatient Medications Prior  to Visit  Medication Sig Dispense Refill   acetaminophen (TYLENOL) 500 MG tablet Take 1,000 mg by mouth every 6 (six) hours as needed for mild pain or fever.     albuterol (PROVENTIL) (2.5 MG/3ML) 0.083% nebulizer solution Take 3 mLs (2.5 mg total) by nebulization every 6 (six) hours as needed for wheezing or shortness of breath. 75 mL 12   albuterol (VENTOLIN HFA) 108 (90 Base) MCG/ACT inhaler TAKE 2 PUFFS BY MOUTH EVERY 6 HOURS AS NEEDED FOR WHEEZE OR SHORTNESS OF BREATH 8.5 each 2   apixaban (ELIQUIS) 2.5 MG TABS tablet Take 1 tablet (2.5 mg total) by mouth 2 (two) times daily. 60 tablet 5   beta carotene w/minerals (OCUVITE) tablet Take 1 tablet by mouth in the morning.     Budeson-Glycopyrrol-Formoterol (BREZTRI AEROSPHERE) 160-9-4.8  MCG/ACT AERO Inhale 2 puffs into the lungs in the morning and at bedtime. 10.7 g 12   CALCIUM PO Take 1,200 mg by mouth in the morning.     Cinnamon 500 MG TABS Take 1,000 mg by mouth at bedtime.     ferrous sulfate 325 (65 FE) MG EC tablet Take 1 tablet (325 mg total) by mouth daily. 90 tablet 1   JARDIANCE 10 MG TABS tablet TAKE 1 TABLET BY MOUTH EVERY DAY BEFORE BREAKFAST 30 tablet 3   metFORMIN (GLUCOPHAGE) 1000 MG tablet TAKE 1 TABLET (1,000 MG TOTAL) BY MOUTH 2 (TWO) TIMES DAILY WITH A MEAL. 180 tablet 1   Multiple Vitamins-Minerals (CENTRUM SILVER 50+WOMEN PO) Take 1 tablet by mouth in the morning.     omeprazole (PRILOSEC) 40 MG capsule TAKE 1 CAPSULE BY MOUTH EVERY DAY 90 capsule 1   potassium chloride SA (KLOR-CON M) 20 MEQ tablet Take 1 tablet (20 mEq total) by mouth daily. 30 tablet 0   pravastatin (PRAVACHOL) 10 MG tablet TAKE 1 TABLET BY MOUTH EVERY DAY 90 tablet 1   Pseudoephedrine-guaiFENesin (MUCINEX D PO) Take 5 mLs by mouth at bedtime.     Spacer/Aero-Holding Dorise Bullion Use with inhaler 1 each 2   tadalafil, PAH, (ADCIRCA) 20 MG tablet Take 1 tablet (20 mg total) by mouth daily. 30 tablet 11   torsemide (DEMADEX) 20 MG tablet Take 1 tablet (20 mg total) by mouth daily. 30 tablet 0   vitamin C (ASCORBIC ACID) 500 MG tablet Take 500 mg by mouth every Tuesday, Thursday, Saturday, and Sunday. In the morning.     vitamin E 180 MG (400 UNITS) capsule Take 400 Units by mouth every Monday, Wednesday, and Friday. In the morning.     No facility-administered medications prior to visit.     Review of Systems:   Constitutional: No weight loss or gain, night sweats, fevers, chills, fatigue, or lassitude. HEENT: No headaches, difficulty swallowing, tooth/dental problems, or sore throat. No sneezing, itching, ear ache, nasal congestion, or post nasal drip CV:  No chest pain, orthopnea, PND, swelling in lower extremities, anasarca, dizziness, palpitations, syncope Resp: +shortness of  breath with exertion (baseline). No excess mucus or change in color of mucus. No productive or non-productive. No hemoptysis. No wheezing.  No chest wall deformity GI:  No heartburn, indigestion, abdominal pain, nausea, vomiting, diarrhea, change in bowel habits, loss of appetite, bloody stools.  MSK:  No joint pain or swelling.  No decreased range of motion.  No back pain. Neuro: No dizziness or lightheadedness.  Psych: No depression or anxiety. Mood stable.     Physical Exam:  BP 94/60   Pulse 77  Ht _0  (1.575 m)   Wt 120 lb 12.8 oz (54.8 kg)   SpO2 98%   BMI 22.09 kg/m   GEN: Pleasant, interactive, elderly, frail; in no acute distress. HEENT:  Normocephalic and atraumatic. PERRLA. Sclera white. Nasal turbinates pink, moist and patent bilaterally. No rhinorrhea present. Oropharynx pink and moist, without exudate or edema. No lesions, ulcerations, or postnasal drip.  NECK:  Supple w/ fair ROM. No JVD present. Normal carotid impulses w/o bruits. Thyroid symmetrical with goiter. No lymphadenopathy.   CV: RRR, no m/r/g, no peripheral edema. Pulses intact, +2 bilaterally. No cyanosis, pallor or clubbing. PULMONARY:  Unlabored, regular breathing. Diminished bases bilaterally; otherwise clear A&P w/o wheezes/rales/rhonchi. No accessory muscle use. No dullness to percussion. GI: BS present and normoactive. Soft, non-tender to palpation. No organomegaly or masses detected. MSK: No erythema, warmth or tenderness. No deformities or joint swelling noted.  Neuro: A/Ox3. No focal deficits noted.   Skin: Warm, no lesions or rashe Psych: Normal affect and behavior. Judgement and thought content appropriate.     Lab Results:  CBC    Component Value Date/Time   WBC 7.1 06/04/2022 0026   RBC 4.49 06/04/2022 0026   HGB 14.3 06/04/2022 0929   HGB 9.9 (L) 06/04/2021 1137   HCT 42.0 06/04/2022 0929   HCT 31.2 (L) 06/04/2021 1137   PLT 242 06/04/2022 0026   PLT 313 06/04/2021 1137   MCV  87.5 06/04/2022 0026   MCV 86 06/04/2021 1137   MCH 29.4 06/04/2022 0026   MCHC 33.6 06/04/2022 0026   RDW 17.9 (H) 06/04/2022 0026   RDW 15.8 (H) 06/04/2021 1137   LYMPHSABS 2.0 06/02/2022 0045   MONOABS 0.6 06/02/2022 0045   EOSABS 0.1 06/02/2022 0045   BASOSABS 0.1 06/02/2022 0045    BMET    Component Value Date/Time   NA 138 06/06/2022 0325   NA 139 01/07/2022 1255   K 3.9 06/06/2022 0325   CL 100 06/06/2022 0325   CO2 25 06/06/2022 0325   GLUCOSE 149 (H) 06/06/2022 0325   BUN 22 06/06/2022 0325   BUN 18 01/07/2022 1255   CREATININE 1.09 (H) 06/06/2022 0325   CALCIUM 9.3 06/06/2022 0325   GFRNONAA 51 (L) 06/06/2022 0325   GFRAA 81 05/13/2020 1043    BNP    Component Value Date/Time   BNP 955.7 (H) 06/02/2022 0045     Imaging:  CARDIAC CATHETERIZATION  Result Date: 06/04/2022 1. Normal filling pressures. 2. Moderate to severe pulmonary arterial hypertension. 3. Low but not markedly low cardiac output. 4. No evidence for significant left to right shunt by shunt run.   ECHOCARDIOGRAM LIMITED BUBBLE STUDY  Result Date: 06/03/2022    ECHOCARDIOGRAM LIMITED REPORT   Patient Name:   Sura Canul Date of Exam: 06/03/2022 Medical Rec #:  [id number]     Height:       62.0 in Accession #:    [id number]    Weight:       124.3 lb Date of Birth:  Jul 24, 1941      BSA:          1.562 m Patient Age:    32 years      BP:           106/73 mmHg Patient Gender: F             HR:           82 bpm. Exam Location:  Inpatient Procedure: Limited Echo, Limited Color Doppler and Cardiac  Doppler Indications:     ASD (atrial septal defect) 745.5 / Q21.1  History:         Patient has prior history of Echocardiogram examinations, most                  recent 10/08/2021. CHF, COPD and Pulmonary HTN,                  Signs/Symptoms:Dyspnea; Risk Factors:Diabetes and GERD.  Sonographer:     Bernadene Person RDCS Referring Phys:  Ina Homes, C Diagnosing Phys: [place] IMPRESSIONS  1. Left ventricular  ejection fraction, by estimation, is 55 to 60%. The left ventricle has normal function. There is the interventricular septum is flattened in systole and diastole, consistent with right ventricular pressure and volume overload.  2. Right ventricular systolic function is moderately reduced. The right ventricular size is moderately enlarged. There is severely elevated pulmonary artery systolic pressure.  3. R-L shunt with moderate bubbles~ 3-5 cardiac cycles, c/f interatrial shunt. The septum is not well visualzed.  4. The inferior vena cava is dilated in size with >50% respiratory variability, suggesting right atrial pressure of 8 mmHg. Conclusion(s)/Recommendation(s): Can consider TEE for evaluation for interatrial shunt however with significant PA pressures/RV failure, she is not a candidate for ASD closure. Consider shunt run with RHC. FINDINGS  Left Ventricle: Left ventricular ejection fraction, by estimation, is 55 to 60%. The left ventricle has normal function. The interventricular septum is flattened in systole and diastole, consistent with right ventricular pressure and volume overload. Right Ventricle: The right ventricular size is moderately enlarged. Right ventricular systolic function is moderately reduced. There is severely elevated pulmonary artery systolic pressure. The tricuspid regurgitant velocity is 3.74 m/s, and with an assumed right atrial pressure of 8 mmHg, the estimated right ventricular systolic pressure is 40.9 mmHg. Venous: The inferior vena cava is dilated in size with greater than 50% respiratory variability, suggesting right atrial pressure of 8 mmHg.  LV Volumes (MOD) LV vol d, MOD A2C: 52.6 ml LV vol d, MOD A4C: 45.2 ml LV vol s, MOD A2C: 27.7 ml LV vol s, MOD A4C: 18.7 ml LV SV MOD A2C:     24.9 ml LV SV MOD A4C:     45.2 ml LV SV MOD BP:      26.0 ml RIGHT ATRIUM           Index RA Area:     22.30 cm RA Volume:   71.90 ml  46.03 ml/m  TRICUSPID VALVE TR Peak grad:   56.0 mmHg TR  Vmax:        374.00 cm/s Phineas Inches Electronically signed by Phineas Inches Signature Date/Time: 06/03/2022/10:17:45 AM    Final (Updated)    VAS [place] LOWER EXTREMITY VENOUS (DVT)  Result Date: 06/02/2022  Lower Venous DVT Study Patient Name:  MELAYNA ROBARTS  Date of Exam:   06/02/2022 Medical Rec #: [id number]      Accession #:    [id number] Date of Birth: [birth year]       Patient Gender: F Patient Age:   59 years Exam Location:  [facility] Procedure:      VAS [place] LOWER EXTREMITY VENOUS (DVT) Referring Phys: Ina Homes --------------------------------------------------------------------------------  Indications: Edema.  Comparison Study: no prior Performing Technologist: Archie Patten RVS  Examination Guidelines: A complete evaluation includes B-mode imaging, spectral Doppler, color Doppler, and power Doppler as needed of all accessible portions of each vessel. Bilateral testing is considered an integral part of  a complete examination. Limited examinations for reoccurring indications may be performed as noted. The reflux portion of the exam is performed with the patient in reverse Trendelenburg.  +---------+---------------+---------+-----------+----------+--------------+ RIGHT    CompressibilityPhasicitySpontaneityPropertiesThrombus Aging +---------+---------------+---------+-----------+----------+--------------+ CFV      Full           Yes      Yes                                 +---------+---------------+---------+-----------+----------+--------------+ SFJ      Full                                                        +---------+---------------+---------+-----------+----------+--------------+ FV Prox  Full                                                        +---------+---------------+---------+-----------+----------+--------------+ FV Mid   Full                                                         +---------+---------------+---------+-----------+----------+--------------+ FV DistalFull                                                        +---------+---------------+---------+-----------+----------+--------------+ PFV      Full                                                        +---------+---------------+---------+-----------+----------+--------------+ POP      Full           Yes      Yes                                 +---------+---------------+---------+-----------+----------+--------------+ PTV      Full                                                        +---------+---------------+---------+-----------+----------+--------------+ PERO     Full                                                        +---------+---------------+---------+-----------+----------+--------------+   +---------+---------------+---------+-----------+----------+--------------+ LEFT     CompressibilityPhasicitySpontaneityPropertiesThrombus Aging +---------+---------------+---------+-----------+----------+--------------+ CFV      Full  Yes      Yes                                 +---------+---------------+---------+-----------+----------+--------------+ SFJ      Full                                                        +---------+---------------+---------+-----------+----------+--------------+ FV Prox  Full                                                        +---------+---------------+---------+-----------+----------+--------------+ FV Mid   Full                                                        +---------+---------------+---------+-----------+----------+--------------+ FV DistalFull                                                        +---------+---------------+---------+-----------+----------+--------------+ PFV      Full                                                         +---------+---------------+---------+-----------+----------+--------------+ POP      Full           Yes      Yes                                 +---------+---------------+---------+-----------+----------+--------------+ PTV      Full                                                        +---------+---------------+---------+-----------+----------+--------------+ PERO     Full                                                        +---------+---------------+---------+-----------+----------+--------------+     Summary: BILATERAL: - No evidence of deep vein thrombosis seen in the lower extremities, bilaterally. -No evidence of popliteal cyst, bilaterally.   *See table(s) above for measurements and observations. Electronically signed by Harold Barban MD on 06/02/2022 at 9:18:02 PM.    Final    DG Chest Port 1 View  Result Date: 06/02/2022 CLINICAL DATA:  Dyspnea EXAM: PORTABLE CHEST 1 VIEW COMPARISON:  Radiographs 03/22/2022 FINDINGS: Stable linear scarring in the left lateral mid lung. Postoperative changes in the right lung. No pleural effusion or pneumothorax. Stable borderline cardiomegaly. Aortic atherosclerotic calcification. Prominent central pulmonary arteries. Diffuse interstitial coarsening. No acute osseous abnormality. IMPRESSION: 1. No acute cardiopulmonary process. 2. Prominent central pulmonary arteries possibly due to pulmonary hypertension. 3. Chronic interstitial coarsening. Electronically Signed   By: Placido Sou M.D.   On: 06/02/2022 01:03          No data to display          No results found for: "NITRICOXIDE"      Assessment & Plan:   Pulmonary hypertension, unspecified ([place]) Group 2/3 with possible group 1 given positive autoimmune work-up and potential underlying mixed connective tissue disease. She is waiting on rheumatology referral. Recently hospitalized for acute on chronic respiratory failure related to volume overload from CHF  exacerbation/severe PAH/RV failure.  She is clinically improved.  Looks good today in office.  Currently on tadalafil and torsemide.  She will see Dr. Aundra Adams on Friday with the advanced heart failure clinic.  Patient Instructions  -Continue Breztri 2 puffs Twice daily with spacer. Brush tongue and rinse mouth afterwards. -Continue Albuterol inhaler 2 puffs or 3 mL neb every 6 hours as needed for shortness of breath or wheezing. Notify if symptoms persist despite rescue inhaler/neb use. -Continue Eliquis 1 tab Twice daily. Monitor for bleeding or excessive bruising  -Continue torsemide 1 tab daily. Monitor your weight at the same time daily; notify if you have 2-3 lb weight gain in 1 day or 5 lb in a week  -Continue tadalafil 1 tab daily  -Continue supplemental oxygen 4 lpm for goal oxygen level >88-90%. Monitor O2 levels during activity; ok to increase to 6 lpm if needed but you were able to maintain on 4 lpm today   Follow up with cardiology as scheduled    Follow up in 6 weeks with Dr. Patsey Adams or Katie Cimberly Stoffel,NP. If symptoms do not improve or worsen, please contact office for sooner follow up or seek emergency care.    Chronic diastolic CHF (congestive heart failure) (HCC) Euvolemic on exam.  See above plan.  COPD with chronic bronchitis and emphysema ([place]) She is currently maintained on triple therapy with Breztri.  Previously had good response to this when we changed her from Trelegy in May.  Clinically stable today.  No changes to inhaler regimen.  Chronic respiratory failure with hypoxia (HCC) During her hospital stay, she required up to 10 L/min but was able to be titrated down to 4 L/min rest and 6 L minute with activity at discharge.  We trialed her back on her baseline of 4 L/min today given her clinical improvement.  She was able to maintain oxygen saturations greater than 96% at rest and with activity.  Instructed her that she could decrease back to this and to monitor O2 levels at  home for goal greater than 88 to 90%.  Advised that she could return to her exercise programs but to use caution and monitor her oxygen levels closely.  Squamous cell carcinoma lung, unspecified laterality (HCC) No evidence of recurrence on recent CT.  She last saw oncology in August.   Pulmonary embolism [facility]) History of pulmonary embolism on lifelong anticoagulation with Eliquis.  No excessive bleeding/bruising.     I spent 35 minutes of dedicated to the care of this patient on the date of this encounter to include pre-visit review of records, face-to-face time with the  patient discussing conditions above, post visit ordering of testing, clinical documentation with the electronic health record, making appropriate referrals as documented, and communicating necessary findings to members of the patients care team.  Clayton Bibles, NP 06/10/2022  Pt aware and understands NP's role.

## 2022-06-10 ENCOUNTER — Encounter: Payer: Self-pay | Admitting: Nurse Practitioner

## 2022-06-10 ENCOUNTER — Telehealth: Payer: Medicare Other | Admitting: Nurse Practitioner

## 2022-06-10 ENCOUNTER — Telehealth (INDEPENDENT_AMBULATORY_CARE_PROVIDER_SITE_OTHER): Payer: Medicare Other | Admitting: Nurse Practitioner

## 2022-06-10 DIAGNOSIS — I509 Heart failure, unspecified: Secondary | ICD-10-CM

## 2022-06-10 DIAGNOSIS — I272 Pulmonary hypertension, unspecified: Secondary | ICD-10-CM

## 2022-06-10 DIAGNOSIS — J9621 Acute and chronic respiratory failure with hypoxia: Secondary | ICD-10-CM

## 2022-06-10 NOTE — Progress Notes (Signed)
Agree with assessment and plan as per Marland Kitchen, NP.  Renold Don, MD Advanced Bronchoscopy PCCM [facility]

## 2022-06-10 NOTE — Progress Notes (Signed)
Virtual Visit via MyChart   This visit type was conducted due to national recommendations for restrictions regarding the COVID-19 Pandemic (e.g. social distancing) in an effort to limit this patient's exposure and mitigate transmission in our community.  Due to her co-morbid illnesses, this patient is at least at moderate risk for complications without adequate follow up.  This format is felt to be most appropriate for this patient at this time.  All issues noted in this document were discussed and addressed.  A limited physical exam was performed with this format.    This visit type was conducted due to national recommendations for restrictions regarding the COVID-19 Pandemic (e.g. social distancing) in an effort to limit this patient's exposure and mitigate transmission in our community.  Patients identity confirmed using two different identifiers.  This format is felt to be most appropriate for this patient at this time.  All issues noted in this document were discussed and addressed.  No physical exam was performed (except for noted visual exam findings with Video Visits).    Date:  06/20/2022   ID:  Brianna Adams, DOB 23-Dec-1940, MRN [id number]  Patient Location:  Home - spoke with Brianna Adams with her daughter present  Provider location:   Office    Chief Complaint:  Hospital F/u  History of Present Illness:    Brianna Adams is a 81 y.o. female who presents via video conferencing for a telehealth visit today.    The patient does not have symptoms concerning for COVID-19 infection (fever, chills, cough, or new shortness of breath).   Virtual visit for hospital admission from 9/27 - 10/1 after having worsening dyspnea. Diagnosed with acute on chronic respiratory failure and acute on chronic congestive heart failure with severe pulmonary hypertension. During her hospitalization she was started on Bipap at 11 l and is now down to 4 l/m at rest and 6 l/m with exertion. She also had her  lasix changed to torsemide and she is doing better.  When she seen Dr. [name] on 9/13 she was to be on 4l at rest and 6 l with exertion. Her daughter reports she can return to [employer].   She did have palliative care consult. Her daughter was concerned about the provider not communicating they were ordering a palliative care. Recommended to have PT. She has an appt with Cardiology tomorrow.      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   PE (pulmonary thromboembolism) ([place]) 02/29/2020   Pulmonary hypertension ([place])    Uterine cancer [facility])    Past Surgical History:  Procedure Laterality Date   ABDOMINAL HYSTERECTOMY     cataract surgery  2012 and 2017   COLONOSCOPY     FIBEROPTIC BRONCHOSCOPY     FLEXIBLE SIGMOIDOSCOPY N/A 08/13/2021   Procedure: FLEXIBLE SIGMOIDOSCOPY;  Surgeon: Sharyn Creamer, MD;  Location: [facility];  Service: Gastroenterology;  Laterality: N/A;   GANGLION CYST EXCISION Left 09/11/2020   Procedure: EXCISION VOLAR RADIAL GANGLION OF LEFT WRIST;  Surgeon: Daryll Brod, MD;  Location: [facility];  Service: Orthopedics;  Laterality: Left;  AXILLARY BLOCK  resection of lung cancer  2018   RIGHT HEART CATH N/A 10/20/2021   Procedure: RIGHT HEART CATH;  Surgeon: Leonie Man, MD;  Location: [facility];  Service: Cardiovascular;  Laterality: N/A;   RIGHT HEART CATH N/A 03/18/2022   Procedure: RIGHT HEART CATH;  Surgeon: Larey Dresser, MD;  Location: [facility];  Service: Cardiovascular;  Laterality: N/A;   RIGHT HEART CATH N/A 06/04/2022   Procedure: RIGHT HEART CATH;  Surgeon: Larey Dresser, MD;  Location: [facility];   Service: Cardiovascular;  Laterality: N/A;   right lower lobe non-anatomocal lung resection wedge     right thorascoscopy       No outpatient medications have been marked as taking for the 06/10/22 encounter (Video Visit) with [name], [place].     Allergies:   Ace inhibitors, Penicillin g, Zestril [lisinopril], Cortisone, Hydrocortisone, Lipitor [atorvastatin], Mobic [meloxicam], and Pravachol [pravastatin]   Social History   Tobacco Use   Smoking status: Former    Packs/day: 1.00    Years: 58.00    Total pack years: 58.00    Types: Cigarettes    Quit date: 10/22/2015    Years since quitting: 6.6    Passive exposure: Past   Smokeless tobacco: Never   Tobacco comments:    congratulated  Scientific laboratory technician Use: Never used  Substance Use Topics   Alcohol use: Never   Drug use: Never     Family Hx: The patient's family history includes Cancer in her maternal aunt; Heart attack in her father; Hypertension in her mother. There is no history of Stomach cancer, Colon cancer, Esophageal cancer, or Pancreatic cancer.  ROS:   Please see the history of present illness.    Review of Systems  Constitutional: Negative.   Respiratory:  Negative for shortness of breath (Improved - has has her Oxygen cut down as well).   Cardiovascular: Negative.   Skin: Negative.   Neurological: Negative.   Psychiatric/Behavioral: Negative.      All other systems reviewed and are negative.   Labs/Other Tests and Data Reviewed:    Recent Labs: 06/03/2022: ALT 51 06/04/2022: Hemoglobin 14.3; Platelets 242 06/06/2022: Magnesium 2.3 06/11/2022: B Natriuretic Peptide 169.8; BUN 38; Creatinine, Ser 1.24; Potassium 4.8; Sodium 136   Recent Lipid Panel Lab Results  Component Value Date/Time   CHOL 151 03/10/2021 06:20 AM   CHOL 189 03/03/2021 11:50 AM   TRIG 80 03/10/2021 06:20 AM   HDL 51 03/10/2021 06:20 AM   HDL 74 03/03/2021 11:50 AM   CHOLHDL 3.0 03/10/2021 06:20 AM   LDLCALC 84 03/10/2021  06:20 AM   LDLCALC 102 (H) 03/03/2021 11:50 AM    Wt Readings from Last 3 Encounters:  06/11/22 120 lb (54.4 kg)  06/09/22 120 lb 12.8 oz (54.8 kg)  06/06/22 126 lb 1.7 oz (57.2 kg)     Exam:    Vital Signs:  There were no vitals taken for this visit.    Physical Exam Vitals reviewed.  Constitutional:      General: She is not in acute distress.    Appearance: Normal appearance.  Cardiovascular:     Pulses: Normal pulses.     Heart sounds: No murmur heard. Pulmonary:     Effort: Pulmonary effort is normal. No respiratory distress.     Comments: She is wearing her Oxygen during this visit.  Neurological:     General: No focal deficit present.     Mental  Status: She is alert and oriented to person, place, and time. Mental status is at baseline.     Cranial Nerves: No cranial nerve deficit.  Psychiatric:        Mood and Affect: Mood and affect normal.        Behavior: Behavior normal.        Thought Content: Thought content normal.        Cognition and Memory: Memory normal.        Judgment: Judgment normal.     ASSESSMENT & PLAN:    1. Acute on chronic respiratory failure with hypoxia (HCC) TCM Performed. A member of the clinical team spoke with the patient upon dischare. Discharge summary was reviewed in full detail during the visit. Meds reconciled and compared to discharge meds. Medication list is updated and reviewed with the patient.  Greater than 50% face to face time was spent in counseling an coordination of care.  All questions were answered to the satisfaction of the patient.  She was increased on her oxygen and had Bipap up to 11 l high flow.     2. Acute on chronic congestive heart failure, unspecified heart failure type ([place]) Thoughts during hospitalization her overall prognosis is extremely limited reserve with advanced COPD, palliative care was called in for goals of care however per patient not discussed with them prior. I did attempt to explain level of  COPD and the purpose for palliative. There was an order placed for goals of care at discharge with Palliative care. Daughter verbalizes understanding of process. She is now on 4 l oxygen at rest. She is on on Torsemide and is to follow up with Cardiology tomorrow for f/u.  3. Pulmonary hypertension, unspecified ([place])    COVID-19 Education: The signs and symptoms of COVID-19 were discussed with the patient and how to seek care for testing (follow up with PCP or arrange E-visit).  The importance of social distancing was discussed today.  Patient Risk:   After full review of this patients clinical status, I feel that they are at least moderate risk at this time.  Time:   Today, I have spent 11 minutes/ seconds with the patient with telehealth technology discussing above diagnoses.     Medication Adjustments/Labs and Tests Ordered: Current medicines are reviewed at length with the patient today.  Concerns regarding medicines are outlined above.   Tests Ordered: No orders of the defined types were placed in this encounter.   Medication Changes: No orders of the defined types were placed in this encounter.   Disposition:  Follow up prn  Signed, [name], [name]

## 2022-06-11 ENCOUNTER — Encounter (HOSPITAL_COMMUNITY): Payer: Self-pay | Admitting: Cardiology

## 2022-06-11 ENCOUNTER — Other Ambulatory Visit (HOSPITAL_COMMUNITY): Payer: Self-pay

## 2022-06-11 ENCOUNTER — Ambulatory Visit (HOSPITAL_COMMUNITY)
Admission: RE | Admit: 2022-06-11 | Discharge: 2022-06-11 | Disposition: A | Discharge status: Home / Self Care | Payer: Medicare Other | Source: Ambulatory Visit | Attending: Cardiology | Admitting: Cardiology

## 2022-06-11 VITALS — BP 110/62 | HR 86 | Wt 120.0 lb

## 2022-06-11 DIAGNOSIS — Z87891 Personal history of nicotine dependence: Secondary | ICD-10-CM | POA: Diagnosis not present

## 2022-06-11 DIAGNOSIS — I2721 Secondary pulmonary arterial hypertension: Secondary | ICD-10-CM | POA: Insufficient documentation

## 2022-06-11 DIAGNOSIS — Z9981 Dependence on supplemental oxygen: Secondary | ICD-10-CM | POA: Diagnosis not present

## 2022-06-11 DIAGNOSIS — Z7901 Long term (current) use of anticoagulants: Secondary | ICD-10-CM | POA: Insufficient documentation

## 2022-06-11 DIAGNOSIS — Z7984 Long term (current) use of oral hypoglycemic drugs: Secondary | ICD-10-CM | POA: Diagnosis not present

## 2022-06-11 DIAGNOSIS — I493 Ventricular premature depolarization: Secondary | ICD-10-CM | POA: Insufficient documentation

## 2022-06-11 DIAGNOSIS — Z79899 Other long term (current) drug therapy: Secondary | ICD-10-CM | POA: Insufficient documentation

## 2022-06-11 DIAGNOSIS — E119 Type 2 diabetes mellitus without complications: Secondary | ICD-10-CM | POA: Insufficient documentation

## 2022-06-11 DIAGNOSIS — I5032 Chronic diastolic (congestive) heart failure: Secondary | ICD-10-CM | POA: Diagnosis not present

## 2022-06-11 DIAGNOSIS — Z86711 Personal history of pulmonary embolism: Secondary | ICD-10-CM | POA: Diagnosis not present

## 2022-06-11 DIAGNOSIS — I272 Pulmonary hypertension, unspecified: Secondary | ICD-10-CM

## 2022-06-11 DIAGNOSIS — J439 Emphysema, unspecified: Secondary | ICD-10-CM | POA: Diagnosis present

## 2022-06-11 DIAGNOSIS — Z923 Personal history of irradiation: Secondary | ICD-10-CM | POA: Insufficient documentation

## 2022-06-11 DIAGNOSIS — Z85118 Personal history of other malignant neoplasm of bronchus and lung: Secondary | ICD-10-CM | POA: Insufficient documentation

## 2022-06-11 LAB — BASIC METABOLIC PANEL
Anion gap: 13 (ref 5–15)
BUN: 38 mg/dL — ABNORMAL HIGH (ref 8–23)
CO2: 23 mmol/L (ref 22–32)
Calcium: 9.9 mg/dL (ref 8.9–10.3)
Chloride: 100 mmol/L (ref 98–111)
Creatinine, Ser: 1.24 mg/dL — ABNORMAL HIGH (ref 0.44–1.00)
GFR, Estimated: 43 mL/min — ABNORMAL LOW (ref >60)
Glucose, Bld: 109 mg/dL — ABNORMAL HIGH (ref 70–99)
Potassium: 4.8 mmol/L (ref 3.5–5.1)
Sodium: 136 mmol/L (ref 135–145)

## 2022-06-11 LAB — BRAIN NATRIURETIC PEPTIDE: B Natriuretic Peptide: 169.8 pg/mL — ABNORMAL HIGH (ref 0.0–100.0)

## 2022-06-11 MED ORDER — TADALAFIL (PAH) 20 MG PO TABS: 40.0000 mg | ORAL_TABLET | Freq: Every day | ORAL | 11 refills | Status: DC

## 2022-06-11 NOTE — Patient Instructions (Signed)
Increase Tadalafil to 40 mg daily.  Please look out for a phone call from a speciality pharmacy so they may arrange delivery of your Tyvaso   Labs done today, your results will be available in MyChart, we will contact you for abnormal readings.  Your physician recommends that you schedule a follow-up appointment in: 2 months  If you have any questions or concerns before your next appointment please send [place] a message through [place] or call our office at [phone].    TO LEAVE A MESSAGE FOR THE NURSE SELECT OPTION 2, PLEASE LEAVE A MESSAGE INCLUDING: YOUR NAME DATE OF BIRTH CALL BACK NUMBER REASON FOR CALL**this is important as we prioritize the call backs  YOU WILL RECEIVE A CALL BACK THE SAME DAY AS LONG AS YOU CALL BEFORE 4:00 PM At the [facility], you and your health needs are our priority. As part of our continuing mission to provide you with exceptional heart care, we have created designated Provider Care Teams. These Care Teams include your primary Cardiologist (physician) and Advanced Practice Providers (APPs- Physician Assistants and Nurse Practitioners) who all work together to provide you with the care you need, when you need it.   You may see any of the following providers on your designated Care Team at your next follow up: Dr Glori Bickers Dr Loralie Champagne Dr. Roxana Hires, NP Lyda Jester, [place] [facility], [place] Forestine Na, NP Audry Riles, PharmD   Please be sure to bring in all your medications bottles to every appointment.

## 2022-06-13 NOTE — Progress Notes (Signed)
PCP: [name], [name] Cardiology: Dr. Gardiner Rhyme HF Cardiology: Dr. [name]  81 y.o. with history of COPD/emphysema, prior PE in 6/21, lung cancer, and pulmonary hypertension was referred by Dr. Gardiner Rhyme for evaluation of pulmonary hypertension. Patient had right lower lobe lung resection for cancer in 2018 then radiation on the left side in 2019.  CT chest in 5/23 showed advanced emphysema with radiation scarring left upper lobe. She is on home oxygen 4L [place].  Patient was admitted in 4/22 with CHF, echo at that time showed EF 55-60%, moderate RV enlargement with PASP 85 mmHg.  Echo repeated in 2/23 showed EF 60-65%, mild LVH, D-shaped septum, moderate RV dysfunction and moderate RV enlargement, PASP 90 mmHg, mild-moderate TR.  Subsequently RHC was done in 2/23 showing normal RA pressure but significantly elevated PCWP and severe pulmonary hypertension.  This appeared to be mixed pulmonary venous/pulmonary arterial hypertension.   [place] 7/23 showed normal filling pressures and severe pulmonary arterial hypertension. V/Q scan was negative for chronic PEs.   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  Echo bubble study was positive.  Repeat RHC showed moderate-severe PAH with no step up in oxygen saturation to suggest significant left=>right shunting.    She returns for followup of pulmonary hypertension.  She is using 4L home oxygen.  She walks around her house without dyspnea.  No chest pain or lightheadedness.  She has poor energy/fatigue.   Labs (5/23): K 5.1, creatinine 0.93 Labs (8/23): K 4.0, creatinine 1.02 Labs (10/23): K 3.9, creatinine 1.09  ECG (personally reviewed): NSR, right axis deviation, anterior TWIs  PMH: 1. COPD: Emphysema.  Prior smoker.  - on home oxygen - CT chest (5/23): Advanced emphysema, radiation scarring left upper lobe.  2. H/o PE in 6/21 3. Type 2 diabetes  4. Lung cancer s/p RLL resection in 2018 and radiation in 2019.  5. Pulmonary hypertension: Echo (4/22)  with EF 55-60%, moderate RV enlargement with PASP 85 mmHg.  - Echo (2/23) with EF 60-65%, mild LVH, D-shaped septum, moderate RV dysfunction and moderate RV enlargement, PASP 90 mmHg, mild-moderate TR.  - RHC (2/23): mean RA 2, PA 75/21 mean 42, mean PCWP 24, CI 3.37, PVR 3.3 WU - RHC (7/23): mean RA 6, PA 77/26, mean 44, mean PCWP 7, CI 2.37, PVR 10 WU - V/Q scan (7/23): No chronic PE - ANA+, anti-RNP+ - RHC (9/23): mean RA 4, PA 68/23 mean 40, mean PCWP 11, CI 2.12, PVR 8.6 WU.  No step-up in oxygen saturation on shunt run.  - Echo (9/23): EF 55-60%, positive bubble study, D-shaped septum with moderate RV enlargement and moderately decreased RV systolic function, PASP 64 mmHg.   ROS: All systems reviewed and negative except as per HPI.   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r-sr Pharmacist, hospital  Tobacco Use   Smoking status: Former    Packs/day: 1.00    Years: 58.00    Total pack years: 58.00    Types: Cigarettes    Quit date: 10/22/2015    Years since quitting: 6.6    Passive exposure: Past   Smokeless tobacco: Never   Tobacco comments:    congratulated  Scientific laboratory technician  Use: Never used  Substance and Sexual Activity   Alcohol use: Never   Drug use: Never   Sexual activity: Not Currently    Birth control/protection: Surgical    Comment: HYSTERECTOMY  Other Topics Concern   Not on file  Social History Narrative   Not on file   Social Determinants of Health   Financial Resource Strain: Low Risk  (10/21/2021)   Overall Financial Resource Strain (CARDIA)    Difficulty of Paying Living Expenses: Not hard at all  Food Insecurity: No Food Insecurity (06/07/2022)   Hunger Vital Sign    Worried About Running Out of Food in  the Last Year: Never true    Ran Out of Food in the Last Year: Never true  Transportation Needs: No Transportation Needs (06/07/2022)   PRAPARE - Hydrologist (Medical): No    Lack of Transportation (Non-Medical): No  Physical Activity: Insufficiently Active (10/21/2021)   Exercise Vital Sign    Days of Exercise per Week: 3 days    Minutes of Exercise per Session: 40 min  Stress: No Stress Concern Present (10/21/2021)   [place]    Feeling of Stress : Not at all  Social Connections: Not on file  Intimate Partner Violence: Not At Risk (06/02/2022)   Humiliation, Afraid, Rape, and Kick questionnaire    Fear of Current or Ex-Partner: No    Emotionally Abused: No    Physically Abused: No    Sexually Abused: No   Family History  Problem Relation Age of Onset   Hypertension Mother    Heart attack Father    Cancer Maternal Aunt        unknown type   Stomach cancer Neg Hx    Colon cancer Neg Hx    Esophageal cancer Neg Hx    Pancreatic cancer Neg Hx    Current Outpatient Medications  Medication Sig Dispense Refill   acetaminophen (TYLENOL) 500 MG tablet Take 1,000 mg by mouth every 6 (six) hours as needed for mild pain or fever.     albuterol (PROVENTIL) (2.5 MG/3ML) 0.083% nebulizer solution Take 3 mLs (2.5 mg total) by nebulization every 6 (six) hours as needed for wheezing or shortness of breath. 75 mL 12   albuterol (VENTOLIN HFA) 108 (90 Base) MCG/ACT inhaler TAKE 2 PUFFS BY MOUTH EVERY 6 HOURS AS NEEDED FOR WHEEZE OR SHORTNESS OF BREATH 8.5 each 2   apixaban (ELIQUIS) 2.5 MG TABS tablet Take 1 tablet (2.5 mg total) by mouth 2 (two) times daily. 60 tablet 5   beta carotene w/minerals (OCUVITE) tablet Take 1 tablet by mouth in the morning.     Budeson-Glycopyrrol-Formoterol (BREZTRI AEROSPHERE) 160-9-4.8 MCG/ACT AERO Inhale 2 puffs into the lungs in the morning and at bedtime. 10.7 g 12    CALCIUM PO Take 1,200 mg by mouth in the morning.     Cinnamon 500 MG TABS Take 1,000 mg by mouth at bedtime.     ferrous sulfate 325 (65 FE) MG EC tablet Take 1 tablet (325 mg total) by mouth daily. 90 tablet 1   JARDIANCE 10 MG TABS tablet TAKE 1 TABLET BY MOUTH EVERY DAY BEFORE BREAKFAST 30 tablet 3   metFORMIN (GLUCOPHAGE) 1000 MG tablet TAKE 1 TABLET (1,000 MG TOTAL) BY MOUTH 2 (TWO) TIMES DAILY WITH A MEAL. 180 tablet 1   Multiple Vitamins-Minerals (CENTRUM SILVER 50+WOMEN PO) Take 1 tablet by mouth in the morning.  omeprazole (PRILOSEC) 40 MG capsule TAKE 1 CAPSULE BY MOUTH EVERY DAY 90 capsule 1   potassium chloride SA (KLOR-CON M) 20 MEQ tablet Take 1 tablet (20 mEq total) by mouth daily. 30 tablet 0   pravastatin (PRAVACHOL) 10 MG tablet TAKE 1 TABLET BY MOUTH EVERY DAY 90 tablet 1   Pseudoephedrine-guaiFENesin (MUCINEX D PO) Take 5 mLs by mouth at bedtime.     Spacer/Aero-Holding Dorise Bullion Use with inhaler 1 each 2   torsemide (DEMADEX) 20 MG tablet Take 1 tablet (20 mg total) by mouth daily. 30 tablet 0   vitamin C (ASCORBIC ACID) 500 MG tablet Take 500 mg by mouth every Tuesday, Thursday, Saturday, and Sunday. In the morning.     vitamin E 180 MG (400 UNITS) capsule Take 400 Units by mouth every Monday, Wednesday, and Friday. In the morning.     tadalafil, PAH, (ADCIRCA) 20 MG tablet Take 2 tablets (40 mg total) by mouth daily. 30 tablet 11   No current facility-administered medications for this encounter.   Wt Readings from Last 3 Encounters:  06/11/22 54.4 kg (120 lb)  06/09/22 54.8 kg (120 lb 12.8 oz)  06/06/22 57.2 kg (126 lb 1.7 oz)   BP 110/62   Pulse 86   Wt 54.4 kg (120 lb)   SpO2 98% Comment: 4l n/c  BMI 21.95 kg/m  General: NAD, frail Neck: No JVD, no thyromegaly or thyroid nodule.  Lungs: Distant BS CV: Nondisplaced PMI.  Heart regular S1/S2, no S3/S4, no murmur.  No peripheral edema.  No carotid bruit.  Normal pedal pulses.  Abdomen: Soft, nontender,  no hepatosplenomegaly, no distention.  Skin: Intact without lesions or rashes.  Neurologic: Alert and oriented x 3.  Psych: Normal affect. Extremities: No clubbing or cyanosis.  HEENT: Normal.   Assessment/Plan:  1. RV failure: Echo in 2/23 showed EF 60-65%, mild LVH, D-shaped septum, moderate RV dysfunction and moderate RV enlargement, PASP 90 mmHg, mild-moderate TR.  RHC in 7/23 showed normal filling pressures but severe PAH. Echo in 9/23 with  EF 55-60%, D-shaped septum, moderate RV enlargement with moderately decreased systolic function, bubble study positive, PASP 64 mmHg. She does not look volume overloaded on exam today, chronic NYHA class III symptoms.  - Continue torsemide 20 mg daily. BMET/BNP today.  - Continue Jardiance 10 mg daily.  2 COPD: She no longer smokes.  She has advanced emphysema by CT. She is on home oxygen 4-6L.   3. H/o PE: She is on Eliquis. No bleeding issues. 4. Pulmonary hypertension: Echo in 2/23 showed EF 60-65%, mild LVH, D-shaped septum, moderate RV dysfunction and moderate RV enlargement, PASP 90 mmHg, mild-moderate TR. RHC 7/23 showed normal right and left filling pressures and severe pulmonary arterial hypertension with PVR 10 WU.  V/Q scan not suggestive of chronic PEs.  Severe emphysema by chest CT.  Suspect primarily group 3 PH (due to COPD and prior lung resection) but suspect also component of group 1 PH with serologic workup showing ANA + and anti-RNP +, concerning for MCTD/SLE. Referred to Dr. Benjamine Mola with Rheumatology. RHC in 9/23 showed moderate to severe PAH with PVR 8.6 and CI low but not markedly low at 2.1, RA and PCWP were not elevated.  - She has tolerated tadalafil 20 mg daily well, increase to 40 mg daily.  - Would give her a trial of Tyvaso for group 1 component (avoid potential V/Q mismatch causing worsening hypoxemia with systemic agent).  5. Positive bubble study: Suspect PFO.  Shunt run done with RHC in 9/23, no step up. This does not suggest  the presence of a significant ASD.   Followup 2 months with APP.   Loralie Champagne 06/13/2022

## 2022-06-14 ENCOUNTER — Telehealth (HOSPITAL_COMMUNITY): Payer: Self-pay | Admitting: Pharmacy Technician

## 2022-06-14 ENCOUNTER — Telehealth: Payer: Self-pay | Admitting: *Deleted

## 2022-06-14 ENCOUNTER — Other Ambulatory Visit (HOSPITAL_COMMUNITY): Payer: Self-pay

## 2022-06-14 NOTE — Telephone Encounter (Signed)
ATC Ash[place] x1.  LVM to return call.

## 2022-06-14 NOTE — Telephone Encounter (Signed)
Unfortunately, who ordered this an IV was from the hospital when she was hospitalized at.  I did not recommended at the time that I saw her last.  I can therefore not amend the note as it was not a recommendation that came from me.  I have reviewed her arterial blood gases from the hospital and her PaCO2 was in the 30s.  This is not hypercapnic respiratory failure.  Her respiratory failure is due to severe pulmonary hypertension.  Her COPD is not significantly advanced.  The issue is mostly the pulmonary hypertension.  I cannot amend the note that did not result in ordering the equipment.  Again this was ordered when she was at [facility]

## 2022-06-14 NOTE — Telephone Encounter (Signed)
Received a call from [facility] from [place] regarding the amendment of [place] regarding NIV.  The amendment needs to state that the patient has chronic respiratory failure secondary to COPD.  She needs the NIV to sustain life and reduce hospitalizations by reducing CO2 retention.  Dr. [name], please advise.  Thank you.

## 2022-06-14 NOTE — Telephone Encounter (Signed)
Advanced Heart Failure Patient Advocate Encounter  Tyvaso referral started, will fax in once all signatures are obtained.

## 2022-06-15 ENCOUNTER — Other Ambulatory Visit (HOSPITAL_COMMUNITY): Payer: Self-pay

## 2022-06-15 ENCOUNTER — Telehealth (HOSPITAL_COMMUNITY): Payer: Self-pay | Admitting: Pharmacist

## 2022-06-15 NOTE — Telephone Encounter (Signed)
Patient Advocate Encounter   Received notification that prior authorization for Tyvaso DPI is required.   PA submitted via fax Status is pending   Will continue to follow.   Audry Riles, PharmD, BCPS, BCCP, CPP Heart Failure Clinic Pharmacist [phone]

## 2022-06-16 ENCOUNTER — Other Ambulatory Visit (HOSPITAL_COMMUNITY): Payer: Self-pay

## 2022-06-16 NOTE — Telephone Encounter (Signed)
Advanced Heart Failure Patient Advocate Encounter  Sent in Tyvaso referral via fax.  Will follow up.

## 2022-06-21 ENCOUNTER — Other Ambulatory Visit (HOSPITAL_COMMUNITY): Payer: Self-pay

## 2022-06-22 NOTE — Telephone Encounter (Signed)
Received message from [place]:  Patient Brianna Adams 10/22/1940 has been cleared for the Tyvaso DPI referral. Pharmacy and nursing will be working with the patient to set up start of care.  Will add to medication list.    Audry Riles, PharmD, BCPS, BCCP, CPP Heart Failure Clinic Pharmacist [phone]

## 2022-06-24 ENCOUNTER — Other Ambulatory Visit (HOSPITAL_COMMUNITY): Payer: Self-pay | Admitting: *Deleted

## 2022-06-24 MED ORDER — TADALAFIL (PAH) 20 MG PO TABS: 40.0000 mg | ORAL_TABLET | Freq: Every day | ORAL | 11 refills | Status: DC

## 2022-07-06 ENCOUNTER — Encounter: Payer: Self-pay | Admitting: Nurse Practitioner

## 2022-07-06 ENCOUNTER — Ambulatory Visit (INDEPENDENT_AMBULATORY_CARE_PROVIDER_SITE_OTHER): Payer: Medicare Other | Admitting: Nurse Practitioner

## 2022-07-06 ENCOUNTER — Other Ambulatory Visit (HOSPITAL_COMMUNITY): Payer: Self-pay | Admitting: Cardiology

## 2022-07-06 VITALS — BP 126/80 | HR 104 | Temp 98.1°F | Ht 62.0 in | Wt 121.6 lb

## 2022-07-06 DIAGNOSIS — E113293 Type 2 diabetes mellitus with mild nonproliferative diabetic retinopathy without macular edema, bilateral: Secondary | ICD-10-CM

## 2022-07-06 DIAGNOSIS — I272 Pulmonary hypertension, unspecified: Secondary | ICD-10-CM | POA: Diagnosis not present

## 2022-07-06 DIAGNOSIS — I5032 Chronic diastolic (congestive) heart failure: Secondary | ICD-10-CM | POA: Diagnosis not present

## 2022-07-06 DIAGNOSIS — J449 Chronic obstructive pulmonary disease, unspecified: Secondary | ICD-10-CM

## 2022-07-06 NOTE — Patient Instructions (Signed)

## 2022-07-06 NOTE — Progress Notes (Signed)
I,[name],acting as a Education administrator for [name], [name].,have documented all relevant documentation on the behalf of [name], [name],as directed by  [name], [name] while in the presence of [name], [place].    Subjective:     Patient ID: Brianna Adams , female    DOB: [birth year] , 81 y.o.   MRN: [id number]   Chief Complaint  Patient presents with   Diabetes    HPI  Pt presents today for DM f/u.   Denies chest pain, dizziness, blurred vision.   Her cardiologist would like to start her on a Tyvoso inhaler. She called last week to cardiology to schedule appt and has to have a 20 minutes consultation.  Inquiring about holding her torsemide when she goes on a trip in March to take once arrive in [place]  On 4 l/m oxygen during visit. Now exercising.   Diabetes She presents for her follow-up diabetic visit. She has type 2 diabetes mellitus. There are no hypoglycemic associated symptoms. Pertinent negatives for hypoglycemia include no dizziness or headaches. There are no diabetic associated symptoms. Pertinent negatives for diabetes include no chest pain, no fatigue, no polydipsia, no polyphagia and no polyuria. There are no hypoglycemic complications. There are no diabetic complications. Risk factors for coronary artery disease include sedentary lifestyle. Current diabetic treatment includes oral agent (monotherapy). She has not had a previous visit with a dietitian. (Does not check her blood sugars regularly ) Eye exam is not current.     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   PE (pulmonary thromboembolism) ([place]) 02/29/2020   Pulmonary hypertension  ([place])    Uterine cancer ([place])      Family History  Problem Relation Age of Onset   Hypertension Mother    Heart attack Father    Cancer Maternal Aunt        unknown type   Stomach cancer Neg Hx    Colon cancer Neg Hx    Esophageal cancer Neg Hx    Pancreatic cancer Neg Hx      Current Outpatient Medications:    acetaminophen (TYLENOL) 500 MG tablet, Take 1,000 mg by mouth every 6 (six) hours as needed for mild pain or fever., Disp: , Rfl:    apixaban (ELIQUIS) 2.5 MG TABS tablet, Take 1 tablet (2.5 mg total) by mouth 2 (two) times daily., Disp: 60 tablet, Rfl: 5   beta carotene w/minerals (OCUVITE) tablet, Take 1 tablet by mouth in the morning., Disp: , Rfl:    Budeson-Glycopyrrol-Formoterol (BREZTRI AEROSPHERE) 160-9-4.8 MCG/ACT AERO, Inhale 2 puffs into the lungs in the morning and at bedtime., Disp: 10.7 g, Rfl: 12   CALCIUM PO, Take 1,200 mg by mouth in the morning., Disp: , Rfl:    Cinnamon 500 MG TABS, Take 1,000 mg by mouth at bedtime., Disp: , Rfl:    ferrous sulfate 325 (65 FE) MG EC tablet, Take 1 tablet (325 mg total) by mouth daily., Disp: 90 tablet, Rfl: 1   JARDIANCE 10 MG TABS tablet, TAKE 1 TABLET BY MOUTH EVERY DAY BEFORE BREAKFAST, Disp: 30 tablet, Rfl: 3  KLOR-CON M20 20 MEQ tablet, TAKE 1 TABLET BY MOUTH EVERY DAY, Disp: 30 tablet, Rfl: 0   metFORMIN (GLUCOPHAGE) 1000 MG tablet, TAKE 1 TABLET (1,000 MG TOTAL) BY MOUTH 2 (TWO) TIMES DAILY WITH A MEAL., Disp: 180 tablet, Rfl: 1   omeprazole (PRILOSEC) 40 MG capsule, TAKE 1 CAPSULE BY MOUTH EVERY DAY, Disp: 90 capsule, Rfl: 1   pravastatin (PRAVACHOL) 10 MG tablet, TAKE 1 TABLET BY MOUTH EVERY DAY, Disp: 90 tablet, Rfl: 1   Pseudoephedrine-guaiFENesin (MUCINEX D PO), Take 5 mLs by mouth at bedtime., Disp: , Rfl:    Spacer/Aero-Holding Chambers DEVI, Use with inhaler, Disp: 1 each, Rfl: 2   tadalafil, PAH, (ADCIRCA) 20 MG tablet, Take 2 tablets (40 mg total) by mouth daily., Disp: 30 tablet, Rfl: 11   torsemide  (DEMADEX) 20 MG tablet, TAKE 1 TABLET BY MOUTH EVERY DAY, Disp: 30 tablet, Rfl: 0   vitamin C (ASCORBIC ACID) 500 MG tablet, Take 500 mg by mouth every Tuesday, Thursday, Saturday, and Sunday. In the morning., Disp: , Rfl:    vitamin E 180 MG (400 UNITS) capsule, Take 400 Units by mouth every Monday, Wednesday, and Friday. In the morning., Disp: , Rfl:    albuterol (PROVENTIL) (2.5 MG/3ML) 0.083% nebulizer solution, Take 3 mLs (2.5 mg total) by nebulization every 6 (six) hours as needed for wheezing or shortness of breath. (Patient not taking: Reported on 07/06/2022), Disp: 75 mL, Rfl: 12   albuterol (VENTOLIN HFA) 108 (90 Base) MCG/ACT inhaler, INHALE 2 PUFFS BY MOUTH EVERY 6 HOURS AS NEEDED FOR WHEEZE OR SHORTNESS OF BREATH, Disp: 8.5 each, Rfl: 2   Multiple Vitamins-Minerals (CENTRUM SILVER 50+WOMEN PO), Take 1 tablet by mouth in the morning. (Patient not taking: Reported on 07/06/2022), Disp: , Rfl:    Treprostinil (TYVASO DPI TITRATION KIT) 16 & 32 & 48 MCG POWD, Inhale into the lungs. Inhale 1 breath 4 times daily. Uptitrate to target dose. (Patient not taking: Reported on 07/06/2022), Disp: , Rfl:    Allergies  Allergen Reactions   Ace Inhibitors Swelling   Penicillin G Hives, Shortness Of Breath and Rash   Zestril [Lisinopril] Swelling   Cortisone Other (See Comments)    Unknown reaction   Hydrocortisone Other (See Comments)    Unknown reaction   Lipitor [Atorvastatin] Other (See Comments)    Myalgias (Muscle Pain) in high dosages     Mobic [Meloxicam] Other (See Comments)    Bloody stools   Pravachol [Pravastatin] Other (See Comments)    Chest Pain if high dosage      Review of Systems  Constitutional: Negative.  Negative for fatigue.  Respiratory: Negative.    Cardiovascular: Negative.  Negative for chest pain.  Endocrine: Negative for polydipsia, polyphagia and polyuria.  Neurological: Negative.  Negative for dizziness and headaches.  Psychiatric/Behavioral: Negative.        Today's Vitals   07/06/22 1154  BP: 126/80  Pulse: (!) 104  Temp: 98.1 F (36.7 C)  SpO2: 90%  Weight: 121 lb 9.6 oz (55.2 kg)  Height: _0  (1.575 m)  PainSc: 0-No pain   Body mass index is 22.24 kg/m.  Wt Readings from Last 3 Encounters:  07/06/22 121 lb 9.6 oz (55.2 kg)  06/11/22 120 lb (54.4 kg)  06/09/22 120 lb 12.8 oz (54.8 kg)    Objective:  Physical Exam Vitals reviewed.  Constitutional:      General: She is not in acute distress.    Appearance: Normal appearance.  Cardiovascular:  Pulses: Normal pulses.     Heart sounds: Normal heart sounds. No murmur heard. Pulmonary:     Effort: Pulmonary effort is normal. No respiratory distress.     Breath sounds: Normal breath sounds. No wheezing.     Comments: Wearing oxygen Neurological:     General: No focal deficit present.     Mental Status: She is alert and oriented to person, place, and time.     Cranial Nerves: No cranial nerve deficit.     Motor: No weakness.  Psychiatric:        Mood and Affect: Mood normal.        Behavior: Behavior normal.        Thought Content: Thought content normal.        Judgment: Judgment normal.         Assessment And Plan:     1. Type 2 diabetes mellitus with both eyes affected by mild nonproliferative retinopathy without macular edema, without long-term current use of insulin (HCC) Comments: HgbA1c is stable, continue current medications. - Hemoglobin A1c - BMP8+eGFR  2. Pulmonary hypertension, unspecified ([place]) Comments: Continue f/u with Pulmonary and Cardiology  3. Chronic obstructive pulmonary disease, unspecified COPD type ([place]) Comments: Continue f/u with Pulmonology. she is doing well with her Oxygen  4. Chronic diastolic CHF (congestive heart failure) (HCC) Comments: Stable, encouraged to continue f/u with Cardiology and to confirm how much fluids she should be drinking regularly     Patient was given opportunity to ask questions. Patient verbalized  understanding of the plan and was able to repeat key elements of the plan. All questions were answered to their satisfaction.  [name], [name]   I, [name], [name], have reviewed all documentation for this visit. The documentation on 07/06/22 for the exam, diagnosis, procedures, and orders are all accurate and complete.   IF YOU HAVE BEEN REFERRED TO A SPECIALIST, IT MAY TAKE 1-2 WEEKS TO SCHEDULE/PROCESS THE REFERRAL. IF YOU HAVE NOT HEARD FROM US/SPECIALIST IN TWO WEEKS, PLEASE GIVE [place] A CALL AT [phone].   THE PATIENT IS ENCOURAGED TO PRACTICE SOCIAL DISTANCING DUE TO THE COVID-19 PANDEMIC.

## 2022-07-07 LAB — BMP8+EGFR
BUN/Creatinine Ratio: 24 (ref 12–28)
BUN: 26 mg/dL (ref 8–27)
CO2: 19 mmol/L — ABNORMAL LOW (ref 20–29)
Calcium: 10.6 mg/dL — ABNORMAL HIGH (ref 8.7–10.3)
Chloride: 98 mmol/L (ref 96–106)
Creatinine, Ser: 1.08 mg/dL — ABNORMAL HIGH (ref 0.57–1.00)
Glucose: 109 mg/dL — ABNORMAL HIGH (ref 70–99)
Potassium: 4.5 mmol/L (ref 3.5–5.2)
Sodium: 140 mmol/L (ref 134–144)
eGFR: 51 mL/min/{1.73_m2} — ABNORMAL LOW (ref >59)

## 2022-07-07 LAB — HEMOGLOBIN A1C
Est. average glucose Bld gHb Est-mCnc: 171 mg/dL
Hgb A1c MFr Bld: 7.6 % — ABNORMAL HIGH (ref 4.8–5.6)

## 2022-07-12 ENCOUNTER — Other Ambulatory Visit: Payer: Self-pay | Admitting: Nurse Practitioner

## 2022-07-12 DIAGNOSIS — R051 Acute cough: Secondary | ICD-10-CM

## 2022-07-18 MED ORDER — EMPAGLIFLOZIN 25 MG PO TABS: 25.0000 mg | ORAL_TABLET | Freq: Every day | ORAL | 1 refills | Status: DC

## 2022-07-19 ENCOUNTER — Other Ambulatory Visit (HOSPITAL_COMMUNITY): Payer: Self-pay | Admitting: Cardiology

## 2022-07-22 ENCOUNTER — Ambulatory Visit (INDEPENDENT_AMBULATORY_CARE_PROVIDER_SITE_OTHER): Payer: Medicare Other | Admitting: Nurse Practitioner

## 2022-07-22 ENCOUNTER — Encounter: Payer: Self-pay | Admitting: Nurse Practitioner

## 2022-07-22 VITALS — BP 98/62 | HR 79 | Ht 62.0 in | Wt 121.6 lb

## 2022-07-22 DIAGNOSIS — I2609 Other pulmonary embolism with acute cor pulmonale: Secondary | ICD-10-CM

## 2022-07-22 DIAGNOSIS — J4489 Other specified chronic obstructive pulmonary disease: Secondary | ICD-10-CM

## 2022-07-22 DIAGNOSIS — C3490 Malignant neoplasm of unspecified part of unspecified bronchus or lung: Secondary | ICD-10-CM | POA: Diagnosis not present

## 2022-07-22 DIAGNOSIS — I272 Pulmonary hypertension, unspecified: Secondary | ICD-10-CM

## 2022-07-22 DIAGNOSIS — J439 Emphysema, unspecified: Secondary | ICD-10-CM

## 2022-07-22 DIAGNOSIS — J9611 Chronic respiratory failure with hypoxia: Secondary | ICD-10-CM | POA: Diagnosis not present

## 2022-07-22 NOTE — Assessment & Plan Note (Signed)
No evidence of recurrence on recent CT.  She last saw oncology in August.

## 2022-07-22 NOTE — Assessment & Plan Note (Signed)
Stable without increased O2 requirement. Titrates between 4-6 lpm. Able to maintain on 4 lpm during walk at previous OV. Goal >88-90%.

## 2022-07-22 NOTE — Assessment & Plan Note (Signed)
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 She is waiting on rheumatology referral. Hospitalized for acute on chronic respiratory failure related to volume overload from CHF exacerbation/severe PAH/RV failure in September/October 2023.  She has recovered well since. Euvolemic on exam today. Currently managed on tyvaso, tadalafil and torsemide.  Follow up with Dr. [name] as scheduled.

## 2022-07-22 NOTE — Assessment & Plan Note (Addendum)
She is currently maintained on triple therapy with Breztri. Clinically stable today.  No changes to inhaler regimen.

## 2022-07-22 NOTE — Patient Instructions (Addendum)
-  Continue Breztri 2 puffs Twice daily with spacer. Brush tongue and rinse mouth afterwards. -Continue Albuterol inhaler 2 puffs or 3 mL neb every 6 hours as needed for shortness of breath or wheezing. Notify if symptoms persist despite rescue inhaler/neb use. -Continue Eliquis 1 tab Twice daily. Monitor for bleeding or excessive bruising  -Continue torsemide 1 tab daily. Monitor your weight at the same time daily; notify if you have 2-3 lb weight gain in 1 day or 5 lb in a week  -Continue tadalafil 1 tab daily  -Continue Tyvaso as directed  -Continue supplemental oxygen 4 lpm for goal oxygen level >88-90%. Monitor O2 levels during activity; ok to increase to 6 lpm if needed but you were able to maintain on 4 lpm previously   Follow up with cardiology as scheduled    Follow up in 3 months with Dr. [name]. If symptoms do not improve or worsen, please contact office for sooner follow up or seek emergency care.

## 2022-07-22 NOTE — Progress Notes (Signed)
I agree with the details of the visit as outlined by Clayton Bibles, NP.  Renold Don, MD Advanced [facility]ch[facility] P[facility]  [facility]

## 2022-07-22 NOTE — Progress Notes (Signed)
_0  ID: Brianna Adams, female    DOB: 12-17-1940, 81 y.o.   MRN: [id number]  Chief Complaint  Patient presents with   Follow-up    Pt f/u pulmonary hypertension and COPD, tyvaso is helping with her breathing.     Referring provider: [name], [name]  HPI: 81 year old female, former smoker (25 pack years) followed for COPD with chronic bronchitis and emphysema, chronic respiratory failure. She is a patient of Dr. [name] and last seen in office on 06/09/2022 by [facility] NP. She has a past history of lung cancer s/p RLL resection May 2018, and 2019 radiation to new upper lobe nodule. Past medical history significant for PE on Eliquis, HTN, chronic diastolic CHF, GERD, DM II, CKD stage II, HLD, IDA, multinodular goiter. 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  TEST/EVENTS:  01/16/2021 CT chest wo contrast: overall the CT chest is stable without evidence of local recurrence or metastatic disease. There is stable radiation fibrosis in the lingula with adjacent chronic fx of the left 5th rib. Stable postsurgical changes in the RLL. There are stable borderline enlarged mediastinal and hilar lymph nodes. Atherosclerosis. Heterogenous enlargement of the thyroid gland, consistent with multinodular goiter. Thyroid US advised.   01/20/2022 CT chest without contrast: The right and left pulmonary arteries are enlarged.  There is prominent mediastinal and hilar lymph nodes which are stable when compared to prior CT.  There is a stable left upper lobe, lingula irregular discoid opacity, consistent with postradiation scarring.  There are postsurgical changes in the right lower lobe with adjacent nodular/discoid scarring which is also stable.  There is advanced emphysema present.  There is an unchanged left lateral rib fracture. 03/22/2022 perfusion scan: chronic linear scarring on the left 06/02/2022 CXR: there is chronic  interstitial coarsening, otherwise liungs are clear. Prominent central pulmonary arteries and stable borderline cardiomegaly.  12/24/2021: OV with Adaria Hole NP for worsening DOE over the past month. Feels as though her activity tolerance has declined and she is unable to walk as far as she used to. Also has trouble completing daily tasks around the house. She has not been monitoring her oxygen levels at home but they did increase her home use to 5 lpm due to her shortness of breath. She continues on 4 lpm today with oxygen saturation 100%. AECOPD vs progression -  treated with taper. Advised trial change from Trelegy to [facility].   02/16/2022: OV with Axell Trigueros NP for follow up with her daughter. She did not try switching to the [place] until sometime in May, due to her daughter being out of town, but she has felt a noticeable difference when compared to the Trelegy. Reports feeling better after our last visit with improved activity tolerance. She still does have some DOE at baseline and gets winded with longer distances and climbing. She denies recent cough, wheeze, hemoptysis, weight loss, anorexia.  She is on 4 L/min continuous supplemental O2, which is her baseline.  She is followed by cardiology who referred her to the Advanced HF clinic for her [place]. They report that they have not heard anything to get this scheduled.   05/19/2022: OV with Dr. [name]. Worsening DOE and increased O2 requirement with volume overload. Instructed to increase lasix. Titrate O2 to 6 lpm with activity and hold off on attending Silver Sneakers while O2 requirements increased. Close follow up.  06/09/2022: [place] with Robin Petrakis NP for hospital follow-up.  She was admitted from 06/02/2022 to 06/06/2022 due to acute on chronic respiratory failure related to acute CHF exacerbation and severe pulmonary hypertension.  She had significant increase in O2 requirements, up to 10 L/min, still with desaturations to the 80s.  She was treated with IV diuretics and  had significant improvement in her symptoms.  She underwent right heart cath prior to discharge which showed improvement in her pulmonary artery pressures.  She was transitioned to tadalafil and torsemide by cardiology.  She was discharged on 5 L minute supplemental O2. Today, she reports feeling significantly better.  She feels like her breathing is better than it has been.  She has not had any further swelling since she left the hospital.  She denies any cough, wheeze, orthopnea, palpitations, weight gain, hemoptysis.  She is still using her Breztri inhaler.  Feels like this works well for her.  She is now on tadalafil and torsemide for PAH.  She is scheduled to see Dr. [name] with the advanced heart failure clinic on Friday.  She has been titrating her oxygen to 6 L/min with activity and then down to 4 L/min at rest, when she remembers, which is what she was instructed to do prior to her hospitalization.  This is increased from her baseline which is usually 4 L/min with activity and rest. She wants to know if she can go back to silver sneakers.   07/22/2022: Today - follow up Patient presents today for follow-up with her daughter.  She has been doing well since we saw her last.  She actually feels better than she did at her last appointment.  Feels like her activity tolerance has improved.  She is back to exercising on a regular basis.  No increased oxygen requirements.  Weights have been stable at home.  She has been started on Tyvaso; started the 32 mcg dose yesterday.  So far tolerating well.  Denies any increased cough, chest congestion, wheezing, lower extremity swelling, orthopnea, PND.  Using her [name] as ordered.  Has not had to use albuterol recently.  Still on tadalafil and torsemide.   Allergies  Allergen Reactions   Ace Inhibitors Swelling   Penicillin G Hives, Shortness Of Breath and Rash   Zestril [Lisinopril] Swelling   Cortisone Other (See Comments)    Unknown reaction    Hydrocortisone Other (See Comments)    Unknown reaction   Lipitor [Atorvastatin] Other (See Comments)    Myalgias (Muscle Pain) in high dosages     Mobic [Meloxicam] Other (See Comments)    Bloody stools   Pravachol [Pravastatin] Other (See Comments)    Chest Pain if high dosage     Immunization History  Administered Date(s) Administered   Fluad Quad(high Dose 65+) 05/24/2019, 05/13/2020, 06/18/2022   Influenza, High Dose Seasonal PF 06/01/2021   Influenza-Unspecified 06/06/2018, 06/18/2022   Moderna Covid-19 Vaccine Bivalent Booster 53yr & up 04/24/2022   PFIZER(Purple Top)SARS-COV-2 Vaccination 10/20/2019, 11/19/2019, 06/11/2020   Pfizer Covid-19 Vaccine Bivalent Booster 142yr& up 06/01/2021   Pneumococcal Conjugate-13 10/31/2013   Pneumococcal Polysaccharide-23 12/05/1997, 11/05/2007, 04/07/2015   Td 09/07/2003   Tdap 08/07/2015, 08/17/2017   Zoster Recombinat (Shingrix) 08/17/2017, 06/28/2019, 09/05/2019   Zoster, Live 11/05/2010    Past Medical History:  Diagnosis Date   CHF (congestive heart failure) (HCC)    COPD ([place] obstructive pulmonary disease) ([place]   Diabetes ([place]   type 2   Diverticulitis    Dyspnea    On Oxygen 3L via [place]  GERD (gastroesophageal reflux disease)    High cholesterol    Lung cancer (HCC)    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   PE (pulmonary thromboembolism) ([place]) 02/29/2020   Pulmonary hypertension ([place])    Uterine cancer ([place])     Tobacco History: Social History   Tobacco Use  Smoking Status Former   Packs/day: 1.00   Years: 58.00   Total pack years: 58.00   Types: Cigarettes   Quit date: 10/22/2015   Years since quitting: 6.7   Passive exposure: Past  Smokeless Tobacco Never  Tobacco Comments   congratulated   Counseling given: Not Answered Tobacco comments: congratulated   Outpatient Medications Prior to Visit  Medication Sig Dispense Refill   acetaminophen (TYLENOL) 500 MG tablet  Take 1,000 mg by mouth every 6 (six) hours as needed for mild pain or fever.     albuterol (PROVENTIL) (2.5 MG/3ML) 0.083% nebulizer solution Take 3 mLs (2.5 mg total) by nebulization every 6 (six) hours as needed for wheezing or shortness of breath. 75 mL 12   albuterol (VENTOLIN HFA) 108 (90 Base) MCG/ACT inhaler INHALE 2 PUFFS BY MOUTH EVERY 6 HOURS AS NEEDED FOR WHEEZE OR SHORTNESS OF BREATH 8.5 each 2   apixaban (ELIQUIS) 2.5 MG TABS tablet Take 1 tablet (2.5 mg total) by mouth 2 (two) times daily. 60 tablet 5   beta carotene w/minerals (OCUVITE) tablet Take 1 tablet by mouth in the morning.     Budeson-Glycopyrrol-Formoterol (BREZTRI AEROSPHERE) 160-9-4.8 MCG/ACT AERO Inhale 2 puffs into the lungs in the morning and at bedtime. 10.7 g 12   CALCIUM PO Take 1,200 mg by mouth in the morning.     Cinnamon 500 MG TABS Take 1,000 mg by mouth at bedtime.     empagliflozin (JARDIANCE) 25 MG TABS tablet Take 1 tablet (25 mg total) by mouth daily. (Patient taking differently: Take 10 mg by mouth daily.) 90 tablet 1   ferrous sulfate 325 (65 FE) MG EC tablet Take 1 tablet (325 mg total) by mouth daily. 90 tablet 1   KLOR-CON M20 20 MEQ tablet TAKE 1 TABLET BY MOUTH EVERY DAY 30 tablet 0   metFORMIN (GLUCOPHAGE) 1000 MG tablet TAKE 1 TABLET (1,000 MG TOTAL) BY MOUTH 2 (TWO) TIMES DAILY WITH A MEAL. 180 tablet 1   Multiple Vitamins-Minerals (CENTRUM SILVER 50+WOMEN PO) Take 1 tablet by mouth in the morning.     omeprazole (PRILOSEC) 40 MG capsule TAKE 1 CAPSULE BY MOUTH EVERY DAY 90 capsule 1   pravastatin (PRAVACHOL) 10 MG tablet TAKE 1 TABLET BY MOUTH EVERY DAY 90 tablet 1   Pseudoephedrine-guaiFENesin (MUCINEX D PO) Take 5 mLs by mouth at bedtime.     Spacer/Aero-Holding Dorise Bullion Use with inhaler 1 each 2   tadalafil, PAH, (ADCIRCA) 20 MG tablet Take 2 tablets (40 mg total) by mouth daily. 30 tablet 11   torsemide (DEMADEX) 20 MG tablet TAKE 1 TABLET BY MOUTH EVERY DAY 30 tablet 0   Treprostinil  (TYVASO DPI TITRATION KIT) 16 & 32 & 48 MCG POWD Inhale into the lungs. Inhale 1 breath 4 times daily. Uptitrate to target dose.     vitamin C (ASCORBIC ACID) 500 MG tablet Take 500 mg by mouth every Tuesday, Thursday, Saturday, and Sunday. In the morning.     vitamin E 180 MG (400 UNITS) capsule Take 400 Units by mouth every Monday, Wednesday, and Friday. In the morning.     No facility-administered medications prior to visit.  Review of Systems:   Constitutional: No weight loss or gain, night sweats, fevers, chills, fatigue, or lassitude. HEENT: No headaches, difficulty swallowing, tooth/dental problems, or sore throat. No sneezing, itching, ear ache, nasal congestion, or post nasal drip CV:  No chest pain, orthopnea, PND, swelling in lower extremities, anasarca, dizziness, palpitations, syncope Resp: +shortness of breath with exertion (baseline). No excess mucus or change in color of mucus. No productive or non-productive. No hemoptysis. No wheezing.  No chest wall deformity GI:  No heartburn, indigestion, abdominal pain, nausea, vomiting, diarrhea, change in bowel habits, loss of appetite, bloody stools.  MSK:  No joint pain or swelling.  No decreased range of motion.  No back pain. Neuro: No dizziness or lightheadedness.  Psych: No depression or anxiety. Mood stable.     Physical Exam:  BP 98/62   Pulse 79   Ht _0  (1.575 m)   Wt 121 lb 9.6 oz (55.2 kg)   SpO2 97%   BMI 22.24 kg/m   GEN: Pleasant, interactive, elderly, frail; in no acute distress. HEENT:  Normocephalic and atraumatic. PERRLA. Sclera white. Nasal turbinates pink, moist and patent bilaterally. No rhinorrhea present. Oropharynx pink and moist, without exudate or edema. No lesions, ulcerations, or postnasal drip.  NECK:  Supple w/ fair ROM. No JVD present. Normal carotid impulses w/o bruits. Thyroid symmetrical with goiter. No lymphadenopathy.   CV: RRR, no m/r/g, no peripheral edema. Pulses intact, +2  bilaterally. No cyanosis, pallor or clubbing. PULMONARY:  Unlabored, regular breathing. Diminished bases bilaterally; otherwise clear A&P w/o wheezes/rales/rhonchi. No accessory muscle use. No dullness to percussion. GI: BS present and normoactive. Soft, non-tender to palpation. No organomegaly or masses detected. MSK: No erythema, warmth or tenderness. No deformities or joint swelling noted.  Neuro: A/Ox3. No focal deficits noted.   Skin: Warm, no lesions or rashe Psych: Normal affect and behavior. Judgement and thought content appropriate.     Lab Results:  CBC    Component Value Date/Time   WBC 7.1 06/04/2022 0026   RBC 4.49 06/04/2022 0026   HGB 14.3 06/04/2022 0929   HGB 9.9 (L) 06/04/2021 1137   HCT 42.0 06/04/2022 0929   HCT 31.2 (L) 06/04/2021 1137   PLT 242 06/04/2022 0026   PLT 313 06/04/2021 1137   MCV 87.5 06/04/2022 0026   MCV 86 06/04/2021 1137   MCH 29.4 06/04/2022 0026   MCHC 33.6 06/04/2022 0026   RDW 17.9 (H) 06/04/2022 0026   RDW 15.8 (H) 06/04/2021 1137   LYMPHSABS 2.0 06/02/2022 0045   MONOABS 0.6 06/02/2022 0045   EOSABS 0.1 06/02/2022 0045   BASOSABS 0.1 06/02/2022 0045    BMET    Component Value Date/Time   NA 140 07/06/2022 1307   K 4.5 07/06/2022 1307   CL 98 07/06/2022 1307   CO2 19 (L) 07/06/2022 1307   GLUCOSE 109 (H) 07/06/2022 1307   GLUCOSE 109 (H) 06/11/2022 1440   BUN 26 07/06/2022 1307   CREATININE 1.08 (H) 07/06/2022 1307   CALCIUM 10.6 (H) 07/06/2022 1307   GFRNONAA 43 (L) 06/11/2022 1440   GFRAA 81 05/13/2020 1043    BNP    Component Value Date/Time   BNP 169.8 (H) 06/11/2022 1440     Imaging:  No results found.        No data to display          No results found for: "NITRICOXIDE"      Assessment & Plan:   Pulmonary hypertension, unspecified ([place]) Group  2/3 with possible group 1 given positive autoimmune work-up and potential underlying mixed connective tissue disease. She is waiting on rheumatology  referral. Hospitalized for acute on chronic respiratory failure related to volume overload from CHF exacerbation/severe PAH/RV failure in September/October 2023.  She has recovered well since. Euvolemic on exam today. Currently managed on tyvaso, tadalafil and torsemide.  Follow up with Dr. [name] as scheduled.  COPD with chronic bronchitis and emphysema ([place]) She is currently maintained on triple therapy with Breztri. Clinically stable today.  No changes to inhaler regimen.   Chronic respiratory failure with hypoxia (HCC) Stable without increased O2 requirement. Titrates between 4-6 lpm. Able to maintain on 4 lpm during walk at previous OV. Goal >88-90%.   Squamous cell carcinoma lung, unspecified laterality (HCC) No evidence of recurrence on recent CT.  She last saw oncology in August.    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  No excessive bleeding/bruising.     I spent 28 minutes of dedicated to the care of this patient on the date of this encounter to include pre-visit review of records, face-to-face time with the patient discussing conditions above, post visit ordering of testing, clinical documentation with the electronic health record, making appropriate referrals as documented, and communicating necessary findings to members of the patients care team.  Clayton Bibles, NP 07/22/2022  Pt aware and understands NP's role.

## 2022-07-22 NOTE — Assessment & Plan Note (Signed)
History of pulmonary embolism on lifelong anticoagulation with Eliquis.  No excessive bleeding/bruising. 

## 2022-07-26 ENCOUNTER — Other Ambulatory Visit (HOSPITAL_COMMUNITY): Payer: Self-pay | Admitting: Cardiology

## 2022-07-26 ENCOUNTER — Encounter: Payer: Self-pay | Admitting: Pulmonary Disease

## 2022-07-26 ENCOUNTER — Encounter: Payer: Self-pay | Admitting: Nurse Practitioner

## 2022-08-11 ENCOUNTER — Encounter (HOSPITAL_COMMUNITY): Payer: Self-pay

## 2022-08-12 ENCOUNTER — Encounter (HOSPITAL_COMMUNITY): Payer: Self-pay

## 2022-08-12 ENCOUNTER — Ambulatory Visit (HOSPITAL_COMMUNITY)
Admission: RE | Admit: 2022-08-12 | Discharge: 2022-08-12 | Disposition: A | Discharge status: Home / Self Care | Payer: Medicare Other | Source: Ambulatory Visit | Attending: Cardiology | Admitting: Cardiology

## 2022-08-12 VITALS — BP 113/67 | HR 83 | Wt 118.6 lb

## 2022-08-12 DIAGNOSIS — I272 Pulmonary hypertension, unspecified: Secondary | ICD-10-CM | POA: Diagnosis not present

## 2022-08-12 DIAGNOSIS — Z7984 Long term (current) use of oral hypoglycemic drugs: Secondary | ICD-10-CM | POA: Diagnosis not present

## 2022-08-12 DIAGNOSIS — J439 Emphysema, unspecified: Secondary | ICD-10-CM | POA: Insufficient documentation

## 2022-08-12 DIAGNOSIS — I2721 Secondary pulmonary arterial hypertension: Secondary | ICD-10-CM | POA: Insufficient documentation

## 2022-08-12 DIAGNOSIS — Z79899 Other long term (current) drug therapy: Secondary | ICD-10-CM | POA: Diagnosis not present

## 2022-08-12 DIAGNOSIS — Z800 Family history of malignant neoplasm of digestive organs: Secondary | ICD-10-CM | POA: Diagnosis not present

## 2022-08-12 DIAGNOSIS — Z85118 Personal history of other malignant neoplasm of bronchus and lung: Secondary | ICD-10-CM | POA: Diagnosis not present

## 2022-08-12 DIAGNOSIS — Z86711 Personal history of pulmonary embolism: Secondary | ICD-10-CM | POA: Diagnosis not present

## 2022-08-12 DIAGNOSIS — Z7901 Long term (current) use of anticoagulants: Secondary | ICD-10-CM | POA: Diagnosis not present

## 2022-08-12 DIAGNOSIS — I50810 Right heart failure, unspecified: Secondary | ICD-10-CM | POA: Diagnosis not present

## 2022-08-12 NOTE — Progress Notes (Signed)
[facility] Virtual visit Progress Note    PCP: [name], [place] Cardiology: Dr. Gardiner Rhyme HF Cardiology: Dr. [name]  81 y.o. with history of COPD/emphysema, prior PE in 6/21, lung cancer, and pulmonary hypertension was referred by Dr. Gardiner Rhyme for evaluation of pulmonary hypertension. Patient had right lower lobe lung resection for cancer in 2018 then radiation on the left side in 2019.  CT chest in 5/23 showed advanced emphysema with radiation scarring left upper lobe. She is on home oxygen 4L [place].  Patient was admitted in 4/22 with CHF, echo at that time showed EF 55-60%, moderate RV enlargement with PASP 85 mmHg.  Echo repeated in 2/23 showed EF 60-65%, mild LVH, D-shaped septum, moderate RV dysfunction and moderate RV enlargement, PASP 90 mmHg, mild-moderate TR.  Subsequently RHC was done in 2/23 showing normal RA pressure but significantly elevated PCWP and severe pulmonary hypertension.  This appeared to be mixed pulmonary venous/pulmonary arterial hypertension.   [place] 7/23 showed normal filling pressures and severe pulmonary arterial hypertension. V/Q scan was negative for chronic PEs.   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  Echo bubble study was positive.  Repeat RHC showed moderate-severe PAH with no step up in oxygen saturation to suggest significant left=>right shunting.    Last OV 10/23, tadalafil was increased to 40 and she was started on trial of Tyvaso for group 1 component.  Seen today for 2 month f/u for pulmonary hypertension. Virtual visit. Reports doing well. Feels breathing has improved w/ med changes.  She remains on 4L home oxygen. No increased requirements. Works out at gym 2 days a week through [employer] Denies LEE. No wt gain, stable at home. Compliant w/ diuretics. BP 113/67. She reports she saw Dr. Amil Amen w/ rheumatology. Daughter also present during video call. Per their report, it sounds as though there was not high concern given  initial serologic workup and no further studies/ test recommended.    Labs (5/23): K 5.1, creatinine 0.93 Labs (8/23): K 4.0, creatinine 1.02 Labs (10/23): K 3.9, creatinine 1.09  ECG : not performed (virtual visit)   PMH: 1. COPD: Emphysema.  Prior smoker.  - on home oxygen - CT chest (5/23): Advanced emphysema, radiation scarring left upper lobe.  2. H/o PE in 6/21 3. Type 2 diabetes  4. Lung cancer s/p RLL resection in 2018 and radiation in 2019.  5. Pulmonary hypertension: Echo (4/22) with EF 55-60%, moderate RV enlargement with PASP 85 mmHg.  - Echo (2/23) with EF 60-65%, mild LVH, D-shaped septum, moderate RV dysfunction and moderate RV enlargement, PASP 90 mmHg, mild-moderate TR.  - RHC (2/23): mean RA 2, PA 75/21 mean 42, mean PCWP 24, CI 3.37, PVR 3.3 WU - RHC (7/23): mean RA 6, PA 77/26, mean 44, mean PCWP 7, CI 2.37, PVR 10 WU - V/Q scan (7/23): No chronic PE - ANA+, anti-RNP+ - RHC (9/23): mean RA 4, PA 68/23 mean 40, mean PCWP 11, CI 2.12, PVR 8.6 WU.  No step-up in oxygen saturation on shunt run.  - Echo (9/23): EF 55-60%, positive bubble study, D-shaped septum with moderate RV enlargement and moderately decreased RV systolic function, PASP 64 mmHg.   ROS: All systems reviewed and negative except as per HPI.   Social History   Socioeconomic History   Marital status: Widowed    Spouse name: Not on file   Number of children: 1   Years of education: Not on file   Highest education level: Master's  degree (e.g., MA, MS, MEng, MEd, MSW, MBA)  Occupational History   Occupation: retired    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r-sr Pharmacist, hospital  Tobacco Use   Smoking status: Former    Packs/day: 1.00    Years: 58.00    Total pack years: 58.00    Types: Cigarettes    Quit date: 10/22/2015    Years since quitting: 6.8    Passive exposure: Past   Smokeless tobacco: Never   Tobacco comments:    congratulated  Vaping Use   Vaping Use: Never used  Substance and Sexual Activity   Alcohol  use: Never   Drug use: Never   Sexual activity: Not Currently    Birth control/protection: Surgical    Comment: HYSTERECTOMY  Other Topics Concern   Not on file  Social History Narrative   Not on file   Social Determinants of Health   Financial Resource Strain: Low Risk  (10/21/2021)   Overall Financial Resource Strain (CARDIA)    Difficulty of Paying Living Expenses: Not hard at all  Food Insecurity: No Food Insecurity (06/07/2022)   Hunger Vital Sign    Worried About Running Out of Food in the Last Year: Never true    Ran Out of Food in the Last Year: Never true  Transportation Needs: No Transportation Needs (06/07/2022)   PRAPARE - Hydrologist (Medical): No    Lack of Transportation (Non-Medical): No  Physical Activity: Insufficiently Active (10/21/2021)   Exercise Vital Sign    Days of Exercise per Week: 3 days    Minutes of Exercise per Session: 40 min  Stress: No Stress Concern Present (10/21/2021)   [place]    Feeling of Stress : Not at all  Social Connections: Not on file  Intimate Partner Violence: Not At Risk (06/02/2022)   Humiliation, Afraid, Rape, and Kick questionnaire    Fear of Current or Ex-Partner: No    Emotionally Abused: No    Physically Abused: No    Sexually Abused: No   Family History  Problem Relation Age of Onset   Hypertension Mother    Heart attack Father    Cancer Maternal Aunt        unknown type   Stomach cancer Neg Hx    Colon cancer Neg Hx    Esophageal cancer Neg Hx    Pancreatic cancer Neg Hx    Current Outpatient Medications  Medication Sig Dispense Refill   acetaminophen (TYLENOL) 500 MG tablet Take 1,000 mg by mouth every 6 (six) hours as needed for mild pain or fever.     albuterol (PROVENTIL) (2.5 MG/3ML) 0.083% nebulizer solution Take 3 mLs (2.5 mg total) by nebulization every 6 (six) hours as needed for wheezing or shortness of  breath. 75 mL 12   albuterol (VENTOLIN HFA) 108 (90 Base) MCG/ACT inhaler INHALE 2 PUFFS BY MOUTH EVERY 6 HOURS AS NEEDED FOR WHEEZE OR SHORTNESS OF BREATH 8.5 each 2   apixaban (ELIQUIS) 2.5 MG TABS tablet Take 1 tablet (2.5 mg total) by mouth 2 (two) times daily. 60 tablet 5   beta carotene w/minerals (OCUVITE) tablet Take 1 tablet by mouth in the morning.     Budeson-Glycopyrrol-Formoterol (BREZTRI AEROSPHERE) 160-9-4.8 MCG/ACT AERO Inhale 2 puffs into the lungs in the morning and at bedtime. 10.7 g 12   CALCIUM PO Take 1,200 mg by mouth in the morning.     Cinnamon 500 MG TABS Take 1,000 mg  by mouth at bedtime.     empagliflozin (JARDIANCE) 10 MG TABS tablet Take 10 mg by mouth daily.     ferrous sulfate 325 (65 FE) MG EC tablet Take 1 tablet (325 mg total) by mouth daily. 90 tablet 1   KLOR-CON M20 20 MEQ tablet TAKE 1 TABLET BY MOUTH EVERY DAY 30 tablet 0   metFORMIN (GLUCOPHAGE) 1000 MG tablet TAKE 1 TABLET (1,000 MG TOTAL) BY MOUTH 2 (TWO) TIMES DAILY WITH A MEAL. 180 tablet 1   Multiple Vitamins-Minerals (CENTRUM SILVER 50+WOMEN PO) Take 1 tablet by mouth in the morning.     omeprazole (PRILOSEC) 40 MG capsule TAKE 1 CAPSULE BY MOUTH EVERY DAY 90 capsule 1   pravastatin (PRAVACHOL) 10 MG tablet TAKE 1 TABLET BY MOUTH EVERY DAY 90 tablet 1   Pseudoephedrine-guaiFENesin (MUCINEX D PO) Take 5 mLs by mouth at bedtime.     Spacer/Aero-Holding Dorise Bullion Use with inhaler 1 each 2   tadalafil, PAH, (ADCIRCA) 20 MG tablet Take 20 mg by mouth daily.     torsemide (DEMADEX) 20 MG tablet TAKE 1 TABLET BY MOUTH EVERY DAY 30 tablet 0   Treprostinil (TYVASO DPI TITRATION KIT) 16 & 32 & 48 MCG POWD Inhale into the lungs. Inhale 1 breath 4 times daily. Uptitrate to target dose.     vitamin C (ASCORBIC ACID) 500 MG tablet Take 500 mg by mouth every Tuesday, Thursday, Saturday, and Sunday. In the morning.     vitamin E 180 MG (400 UNITS) capsule Take 400 Units by mouth every Monday, Wednesday, and  Friday. In the morning.     No current facility-administered medications for this encounter.   Wt Readings from Last 3 Encounters:  08/12/22 53.8 kg (118 lb 9.6 oz)  07/22/22 55.2 kg (121 lb 9.6 oz)  07/06/22 55.2 kg (121 lb 9.6 oz)   BP 113/67   Pulse 83   Wt 53.8 kg (118 lb 9.6 oz)   BMI 21.69 kg/m  Exam:  (Video/Tele Health Call; Exam is subjective and or/visual.) General:  Speaks in full sentences. No resp difficulty. Lungs: Normal respiratory effort with conversation.  Abdomen: Non-distended per patient report Extremities: Pt denies edema. Neuro: Alert & oriented x 3.    Assessment/Plan:  1. RV failure: Echo in 2/23 showed EF 60-65%, mild LVH, D-shaped septum, moderate RV dysfunction and moderate RV enlargement, PASP 90 mmHg, mild-moderate TR.  RHC in 7/23 showed normal filling pressures but severe PAH. Echo in 9/23 with  EF 55-60%, D-shaped septum, moderate RV enlargement with moderately decreased systolic function, bubble study positive, PASP 64 mmHg. She reports stable chronic NYHA class III symptoms. Denies wt gain. No LEE  - Continue torsemide 20 mg daily.  - Continue Jardiance 10 mg daily.  - will need labs, BNP and BMP  2 COPD: She no longer smokes.  She has advanced emphysema by CT. She is on home oxygen 4L (stable). Followed by [facility]  3. H/o PE: She is on Eliquis. Denies bleeding issues. Check CBC  4. Pulmonary hypertension: Echo in 2/23 showed EF 60-65%, mild LVH, D-shaped septum, moderate RV dysfunction and moderate RV enlargement, PASP 90 mmHg, mild-moderate TR. RHC 7/23 showed normal right and left filling pressures and severe pulmonary arterial hypertension with PVR 10 WU.  V/Q scan not suggestive of chronic PEs.  Severe emphysema by chest CT.  Suspect primarily group 3 PH (due to COPD and prior lung resection) but suspect also component of group 1 PH with serologic  workup showing ANA + and anti-RNP +, concerning for MCTD/SLE. She was referred to  Rheumatology (Dr. Amil Amen), no further w/u warranted per her report (will request copy of clinic note for review). RHC in 9/23 showed moderate to severe PAH with PVR 8.6 and CI low but not markedly low at 2.1, RA and PCWP were not elevated. She has tolerated increase in tadalafil and recent addition of Tyvaso. Feels breathing has improved. Exercises, participating in [employer] program 2 days a wk.  - Continue tadalafil 40 mg daily  - Continue Tyvaso 48 mcg  5. Positive bubble study: Suspect PFO.  Shunt run done with RHC in 9/23, no step up. This does not suggest the presence of a significant ASD.   F/u in 2-3 months w/ Dr. [name]   Today, I have spent 15 minutes with the patient with telehealth technology discussing the above issues .    Lyda Jester, PA-C  08/12/2022

## 2022-08-12 NOTE — Addendum Note (Signed)
Encounter addended by: Payton Mccallum, RN on: 08/12/2022 2:23 PM  Actions taken: Order list changed, Diagnosis association updated, Flowsheet accepted, Clinical Note Signed

## 2022-08-12 NOTE — Patient Instructions (Addendum)
Your physician recommends that you schedule a follow-up appointment in: 2-3 months with Dr. [name]  Your physician recommends that you return for lab work in: lab work CBC BMP and BNP

## 2022-08-12 NOTE — Progress Notes (Signed)
Spoke with pt daughter and she stated she was recently diagnosed with Covid, but pt was not symptomatic and was not diagnosed with covid, pt scheduled for labs. And daughter stated that she would call back and get pt scheduled for 2-3 months with Dr. [name].

## 2022-08-12 NOTE — Progress Notes (Signed)
  Patient Consent for Virtual Visit        Brianna Adams has provided verbal consent on 08/12/2022 for a virtual visit (video or telephone).   CONSENT FOR VIRTUAL VISIT FOR:  Brianna Adams  By participating in this virtual visit I agree to the following:  I hereby voluntarily request, consent and authorize [facility] and its employed or contracted physicians, physician assistants, nurse practitioners or other licensed health care professionals (the Practitioner), to provide me with telemedicine health care services (the "Services") as deemed necessary by the treating Practitioner. I acknowledge and consent to receive the Services by the Practitioner via telemedicine. I understand that the telemedicine visit will involve communicating with the Practitioner through live audiovisual communication technology and the disclosure of certain medical information by electronic transmission. I acknowledge that I have been given the opportunity to request an in-person assessment or other available alternative prior to the telemedicine visit and am voluntarily participating in the telemedicine visit.  I understand that I have the right to withhold or withdraw my consent to the use of telemedicine in the course of my care at any time, without affecting my right to future care or treatment, and that the Practitioner or I may terminate the telemedicine visit at any time. I understand that I have the right to inspect all information obtained and/or recorded in the course of the telemedicine visit and may receive copies of available information for a reasonable fee.  I understand that some of the potential risks of receiving the Services via telemedicine include:  Delay or interruption in medical evaluation due to technological equipment failure or disruption; Information transmitted may not be sufficient (e.g. poor resolution of images) to allow for appropriate medical decision making by the Practitioner;  and/or  In rare instances, security protocols could fail, causing a breach of personal health information.  Furthermore, I acknowledge that it is my responsibility to provide information about my medical history, conditions and care that is complete and accurate to the best of my ability. I acknowledge that Practitioner's advice, recommendations, and/or decision may be based on factors not within their control, such as incomplete or inaccurate data provided by me or distortions of diagnostic images or specimens that may result from electronic transmissions. I understand that the practice of medicine is not an exact science and that Practitioner makes no warranties or guarantees regarding treatment outcomes. I acknowledge that a copy of this consent can be made available to me via my patient portal ([facility]), or I can request a printed copy by calling the office of [facility].    I understand that my insurance will be billed for this visit.   I have read or had this consent read to me. I understand the contents of this consent, which adequately explains the benefits and risks of the Services being provided via telemedicine.  I have been provided ample opportunity to ask questions regarding this consent and the Services and have had my questions answered to my satisfaction. I give my informed consent for the services to be provided through the use of telemedicine in my medical care

## 2022-08-24 ENCOUNTER — Other Ambulatory Visit (HOSPITAL_COMMUNITY): Payer: Self-pay | Admitting: Cardiology

## 2022-08-26 ENCOUNTER — Ambulatory Visit (HOSPITAL_COMMUNITY)
Admission: RE | Admit: 2022-08-26 | Discharge: 2022-08-26 | Disposition: A | Discharge status: Home / Self Care | Payer: Medicare Other | Source: Ambulatory Visit | Attending: Internal Medicine | Admitting: Internal Medicine

## 2022-08-26 DIAGNOSIS — I5032 Chronic diastolic (congestive) heart failure: Secondary | ICD-10-CM | POA: Diagnosis present

## 2022-08-26 LAB — BASIC METABOLIC PANEL
Anion gap: 10 (ref 5–15)
BUN: 33 mg/dL — ABNORMAL HIGH (ref 8–23)
CO2: 23 mmol/L (ref 22–32)
Calcium: 9.6 mg/dL (ref 8.9–10.3)
Chloride: 103 mmol/L (ref 98–111)
Creatinine, Ser: 1.32 mg/dL — ABNORMAL HIGH (ref 0.44–1.00)
GFR, Estimated: 40 mL/min — ABNORMAL LOW (ref >60)
Glucose, Bld: 153 mg/dL — ABNORMAL HIGH (ref 70–99)
Potassium: 4.2 mmol/L (ref 3.5–5.1)
Sodium: 136 mmol/L (ref 135–145)

## 2022-08-26 LAB — CBC
HCT: 39.1 % (ref 36.0–46.0)
Hemoglobin: 12.1 g/dL (ref 12.0–15.0)
MCH: 29 pg (ref 26.0–34.0)
MCHC: 30.9 g/dL (ref 30.0–36.0)
MCV: 93.8 fL (ref 80.0–100.0)
Platelets: 289 10*3/uL (ref 150–400)
RBC: 4.17 MIL/uL (ref 3.87–5.11)
RDW: 16.4 % — ABNORMAL HIGH (ref 11.5–15.5)
WBC: 4.9 10*3/uL (ref 4.0–10.5)
nRBC: 0 % (ref 0.0–0.2)

## 2022-08-26 LAB — BRAIN NATRIURETIC PEPTIDE: B Natriuretic Peptide: 186.3 pg/mL — ABNORMAL HIGH (ref 0.0–100.0)

## 2022-09-10 ENCOUNTER — Encounter (HOSPITAL_COMMUNITY): Payer: Self-pay | Admitting: *Deleted

## 2022-09-10 ENCOUNTER — Telehealth (HOSPITAL_COMMUNITY): Payer: Self-pay | Admitting: *Deleted

## 2022-09-10 NOTE — Telephone Encounter (Signed)
Called daughter no answer. Mychart message sent.

## 2022-09-10 NOTE — Telephone Encounter (Signed)
Has she gained weight or does she seem to be retaining fluid? Continue current Tyvaso for now to see if she gets used to it. If not, can decrease to 48 mcg.

## 2022-09-10 NOTE — Telephone Encounter (Signed)
Pts daughter called stating she is on tyvaso 40mg and is more sluggish and fatigued than she was on the lower dose (331m). Daughter asked if she can decrease Tyvaso to 3260m

## 2022-09-14 NOTE — Telephone Encounter (Signed)
Chmg-error.

## 2022-09-14 NOTE — Telephone Encounter (Signed)
Chmg-error.  

## 2022-09-16 ENCOUNTER — Other Ambulatory Visit: Payer: Self-pay | Admitting: Nurse Practitioner

## 2022-09-17 ENCOUNTER — Other Ambulatory Visit: Payer: Self-pay | Admitting: Nurse Practitioner

## 2022-09-19 ENCOUNTER — Emergency Department (HOSPITAL_COMMUNITY): Payer: Medicare Other

## 2022-09-19 ENCOUNTER — Emergency Department (HOSPITAL_COMMUNITY)
Admission: EM | Admit: 2022-09-19 | Discharge: 2022-09-19 | Disposition: A | Discharge status: Home / Self Care | Payer: Medicare Other | Attending: Emergency Medicine | Admitting: Emergency Medicine

## 2022-09-19 ENCOUNTER — Encounter (HOSPITAL_COMMUNITY): Payer: Self-pay

## 2022-09-19 ENCOUNTER — Other Ambulatory Visit: Payer: Self-pay

## 2022-09-19 DIAGNOSIS — J449 Chronic obstructive pulmonary disease, unspecified: Secondary | ICD-10-CM | POA: Insufficient documentation

## 2022-09-19 DIAGNOSIS — E119 Type 2 diabetes mellitus without complications: Secondary | ICD-10-CM | POA: Diagnosis not present

## 2022-09-19 DIAGNOSIS — Z7951 Long term (current) use of inhaled steroids: Secondary | ICD-10-CM | POA: Insufficient documentation

## 2022-09-19 DIAGNOSIS — Z7901 Long term (current) use of anticoagulants: Secondary | ICD-10-CM | POA: Insufficient documentation

## 2022-09-19 DIAGNOSIS — R0902 Hypoxemia: Secondary | ICD-10-CM

## 2022-09-19 DIAGNOSIS — I509 Heart failure, unspecified: Secondary | ICD-10-CM | POA: Insufficient documentation

## 2022-09-19 DIAGNOSIS — I493 Ventricular premature depolarization: Secondary | ICD-10-CM | POA: Insufficient documentation

## 2022-09-19 DIAGNOSIS — Z1152 Encounter for screening for COVID-19: Secondary | ICD-10-CM | POA: Diagnosis not present

## 2022-09-19 DIAGNOSIS — Z86711 Personal history of pulmonary embolism: Secondary | ICD-10-CM | POA: Diagnosis not present

## 2022-09-19 DIAGNOSIS — J984 Other disorders of lung: Secondary | ICD-10-CM | POA: Diagnosis not present

## 2022-09-19 DIAGNOSIS — Z85118 Personal history of other malignant neoplasm of bronchus and lung: Secondary | ICD-10-CM | POA: Insufficient documentation

## 2022-09-19 DIAGNOSIS — Z7984 Long term (current) use of oral hypoglycemic drugs: Secondary | ICD-10-CM | POA: Insufficient documentation

## 2022-09-19 LAB — CBC
HCT: 42.6 % (ref 36.0–46.0)
Hemoglobin: 13.6 g/dL (ref 12.0–15.0)
MCH: 29.7 pg (ref 26.0–34.0)
MCHC: 31.9 g/dL (ref 30.0–36.0)
MCV: 93 fL (ref 80.0–100.0)
Platelets: 319 10*3/uL (ref 150–400)
RBC: 4.58 MIL/uL (ref 3.87–5.11)
RDW: 15.7 % — ABNORMAL HIGH (ref 11.5–15.5)
WBC: 5.2 10*3/uL (ref 4.0–10.5)
nRBC: 0 % (ref 0.0–0.2)

## 2022-09-19 LAB — BASIC METABOLIC PANEL
Anion gap: 13 (ref 5–15)
BUN: 28 mg/dL — ABNORMAL HIGH (ref 8–23)
CO2: 23 mmol/L (ref 22–32)
Calcium: 9.5 mg/dL (ref 8.9–10.3)
Chloride: 98 mmol/L (ref 98–111)
Creatinine, Ser: 1.46 mg/dL — ABNORMAL HIGH (ref 0.44–1.00)
GFR, Estimated: 36 mL/min — ABNORMAL LOW (ref >60)
Glucose, Bld: 204 mg/dL — ABNORMAL HIGH (ref 70–99)
Potassium: 4.6 mmol/L (ref 3.5–5.1)
Sodium: 134 mmol/L — ABNORMAL LOW (ref 135–145)

## 2022-09-19 LAB — RESP PANEL BY RT-PCR (RSV, FLU A&B, COVID)  RVPGX2
Influenza A by PCR: NEGATIVE
Influenza B by PCR: NEGATIVE
Resp Syncytial Virus by PCR: NEGATIVE
SARS Coronavirus 2 by RT PCR: NEGATIVE

## 2022-09-19 LAB — PROTIME-INR
INR: 1.1 (ref 0.8–1.2)
Prothrombin Time: 14.3 seconds (ref 11.4–15.2)

## 2022-09-19 LAB — HEPATIC FUNCTION PANEL
ALT: 29 U/L (ref 0–44)
AST: 24 U/L (ref 15–41)
Albumin: 3.8 g/dL (ref 3.5–5.0)
Alkaline Phosphatase: 50 U/L (ref 38–126)
Bilirubin, Direct: <0.1 mg/dL (ref 0.0–0.2)
Total Bilirubin: 0.6 mg/dL (ref 0.3–1.2)
Total Protein: 7.4 g/dL (ref 6.5–8.1)

## 2022-09-19 LAB — TROPONIN I (HIGH SENSITIVITY)
Troponin I (High Sensitivity): 31 ng/L — ABNORMAL HIGH (ref <18)
Troponin I (High Sensitivity): 32 ng/L — ABNORMAL HIGH (ref <18)

## 2022-09-19 LAB — BRAIN NATRIURETIC PEPTIDE: B Natriuretic Peptide: 396.5 pg/mL — ABNORMAL HIGH (ref 0.0–100.0)

## 2022-09-19 MED ORDER — FUROSEMIDE 10 MG/ML IJ SOLN
20.0000 mg | Freq: Once | INTRAMUSCULAR | Status: AC
  Administered 2022-09-19: 20 mg via INTRAVENOUS
  Filled 2022-09-19: qty 2

## 2022-09-19 NOTE — Discharge Instructions (Signed)
You have been seen and discharged from the emergency department.  Your workup today showed a mild exacerbation of heart failure.  You are given IV diuretics with improvement of your symptoms.  Follow-up with your primary provider for further evaluation and further care. Take home medications as prescribed. If you have any worsening symptoms, difficulty breathing, hypoxia, chest pain or further concerns for your health please return to an emergency department for further evaluation.

## 2022-09-19 NOTE — ED Triage Notes (Addendum)
Pt c/o low SA02 at home 66%. Pt on 4L 02 per [place] at baseline. Pt's family member states pt always has SOB. Pt is a little tachypneic, but does not appear to be in resp distress. Pt's family member states pt has been lethargic for the past week.

## 2022-09-19 NOTE — ED Notes (Signed)
Pt 02 turned up to 6L per [place] and Sa02 90%

## 2022-09-19 NOTE — ED Provider Notes (Signed)
[facility] EMERGENCY DEPARTMENT Provider Note   CSN: [id number] Arrival date & time: 09/19/22  1118     History  Chief Complaint  Patient presents with   hypoxia    Brianna Adams is a 82 y.o. female.  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  Patient is from home, accompanied by her daughter.  Patient states that her breathing has seemed "harder" over the past 2 days.  At home she was found to be hypoxic with oxygenation in the 60s on her baseline supplemental requirement.  Patient states she feels generally weak but denies any acute symptoms including fever, productive cough, chest/back pain, swelling of her lower extremities.  States she is been compliant with her medications, specifically Eliquis and diuretic.  Home Medications Prior to Admission medications   Medication Sig Start Date End Date Taking? Authorizing Provider  acetaminophen (TYLENOL) 500 MG tablet Take 1,000 mg by mouth every 6 (six) hours as needed for mild pain or fever. 01/30/17   [provider]  albuterol (PROVENTIL) (2.5 MG/3ML) 0.083% nebulizer solution Take 3 mLs (2.5 mg total) by nebulization every 6 (six) hours as needed for wheezing or shortness of breath. 12/24/21   Cobb, Karie Schwalbe, NP  albuterol (VENTOLIN HFA) 108 (90 Base) MCG/ACT inhaler INHALE 2 PUFFS BY MOUTH EVERY 6 HOURS AS NEEDED FOR WHEEZE OR SHORTNESS OF BREATH 07/12/22   [name], [name]  apixaban (ELIQUIS) 2.5 MG TABS tablet Take 1 tablet (2.5 mg total) by mouth 2 (two) times daily. 04/20/22   Earlie Server, MD  beta carotene w/minerals (OCUVITE) tablet Take 1 tablet by mouth in the morning.    [provider]  Budeson-Glycopyrrol-Formoterol (BREZTRI AEROSPHERE) 160-9-4.8 MCG/ACT AERO Inhale 2 puffs into the lungs in the morning and at bedtime. 02/16/22    Cobb, Karie Schwalbe, NP  CALCIUM PO Take 1,200 mg by mouth in the morning.    [provider]  Cinnamon 500 MG TABS Take 1,000 mg by mouth at bedtime.    [provider]  empagliflozin (JARDIANCE) 10 MG TABS tablet Take 10 mg by mouth daily.    [provider]  ferrous sulfate 325 (65 FE) MG EC tablet Take 1 tablet (325 mg total) by mouth daily. 04/22/22   Earlie Server, MD  KLOR-CON M20 20 MEQ tablet TAKE 1 TABLET BY MOUTH EVERY DAY 08/24/22   Larey Dresser, MD  metFORMIN (GLUCOPHAGE) 1000 MG tablet TAKE 1 TABLET (1,000 MG TOTAL) BY MOUTH 2 (TWO) TIMES DAILY WITH A MEAL. 01/31/22 01/31/23  [name], [name]  Multiple Vitamins-Minerals (CENTRUM SILVER 50+WOMEN PO) Take 1 tablet by mouth in the morning.    [provider]  omeprazole (PRILOSEC) 40 MG capsule TAKE 1 CAPSULE BY MOUTH EVERY DAY 02/27/22   [name], [name]  pravastatin (PRAVACHOL) 10 MG tablet TAKE 1 TABLET BY MOUTH EVERY DAY 02/27/22   [name], [name]  Pseudoephedrine-guaiFENesin [facility] D PO) Take 5 mLs by mouth at bedtime.    [provider]  Spacer/Aero-Holding Dorise Bullion Use with inhaler 12/24/21   Cobb, Karie Schwalbe, NP  tadalafil, PAH, (ADCIRCA) 20 MG tablet Take 20 mg by mouth daily.    [provider]  torsemide (DEMADEX) 20 MG tablet TAKE 1 TABLET BY MOUTH EVERY DAY 08/24/22   Larey Dresser, MD  Treprostinil (TYVASO DPI TITRATION  KIT) 16 & 32 & 48 MCG POWD Inhale into the lungs. Inhale 1 breath 4 times daily. Uptitrate to target dose.    [provider]  vitamin C (ASCORBIC ACID) 500 MG tablet Take 500 mg by mouth every Tuesday, Thursday, Saturday, and Sunday. In the morning.    [provider]  vitamin E 180 MG (400 UNITS) capsule Take 400 Units by mouth every Monday, Wednesday, and Friday. In the morning.    [provider]      Allergies    Ace inhibitors, Penicillin g, Zestril [lisinopril], Cortisone, Hydrocortisone, Lipitor  [atorvastatin], Mobic [meloxicam], and Pravachol [pravastatin]    Review of Systems   Review of Systems  Constitutional:  Positive for fatigue. Negative for fever.  Respiratory:  Positive for shortness of breath. Negative for cough.   Cardiovascular:  Negative for chest pain, palpitations and leg swelling.  Gastrointestinal:  Negative for abdominal pain, diarrhea and vomiting.  Genitourinary:  Negative for flank pain.  Musculoskeletal:  Negative for back pain.  Skin:  Negative for rash.  Neurological:  Negative for headaches.    Physical Exam Updated Vital Signs BP (!) 104/58 (BP Location: Right Arm)   Pulse 90   Temp 97.9 F (36.6 C) (Oral)   Resp (!) 22   SpO2 (!) 88%  Physical Exam Vitals and nursing note reviewed.  Constitutional:      Comments: Mild increased work of breathing  HENT:     Head: Normocephalic.     Mouth/Throat:     Mouth: Mucous membranes are moist.  Cardiovascular:     Rate and Rhythm: Normal rate.  Pulmonary:     Effort: Pulmonary effort is normal. No respiratory distress.     Comments: Tachypneic, mild increased work of breathing, equal and clear breath sounds bilaterally Abdominal:     Palpations: Abdomen is soft.     Tenderness: There is no abdominal tenderness.  Musculoskeletal:        General: No swelling.  Skin:    General: Skin is warm.  Neurological:     Mental Status: She is alert and oriented to person, place, and time. Mental status is at baseline.  Psychiatric:        Mood and Affect: Mood normal.     ED Results / Procedures / Treatments   Labs (all labs ordered are listed, but only abnormal results are displayed) Labs Reviewed  CBC - Abnormal; Notable for the following components:      Result Value   RDW 15.7 (*)    All other components within normal limits  RESP PANEL BY RT-PCR (RSV, FLU A&B, COVID)  RVPGX2  BASIC METABOLIC PANEL  BRAIN NATRIURETIC PEPTIDE  HEPATIC FUNCTION PANEL  PROTIME-INR  TROPONIN I (HIGH  SENSITIVITY)    EKG EKG Interpretation  Date/Time:  Sunday September 19 2022 11:27:26 EST Ventricular Rate:  91 PR Interval:  126 QRS Duration: 70 QT Interval:  342 QTC Calculation: 420 R Axis:   107 Text Interpretation: Sinus rhythm with occasional Premature ventricular complexes Rightward axis Nonspecific ST and T wave abnormality Abnormal ECG When compared with ECG of 11-Jun-2022 15:47, PREVIOUS ECG IS PRESENT Similar to previous Confirmed by Lavenia Atlas [id number]) on 09/19/2022 12:53:59 PM  Radiology DG Chest Port 1 View  Result Date: 09/19/2022 CLINICAL DATA:  Shortness of breath. EXAM: PORTABLE CHEST 1 VIEW COMPARISON:  06/02/2022 FINDINGS: The heart size and mediastinal contours are within normal limits. Scarring in left upper lobe remains stable. Diffuse pulmonary interstitial prominence  is unchanged. No evidence of acute infiltrate or pleural effusion. IMPRESSION: Stable left upper lobe scarring and chronic interstitial prominence. No acute findings. Electronically Signed   By: Marlaine Hind M.D.   On: 09/19/2022 12:22    Procedures Procedures    Medications Ordered in ED Medications - No data to display  ED Course/ Medical Decision Making/ A&P                             Medical Decision Making Amount and/or Complexity of Data Reviewed Labs: ordered.  Risk Prescription drug management.   82 year old female presents emergency department with hypoxia on her home 4 L oxygen requirement.  Daughter at bedside states that her typical oxygenation is 88% and above.  When she checked her this morning she was in the 60s with increased work of breathing.  On arrival patient is on 6 L, mild increased work of breathing.  Scattered rales at lung bases.  She is afebrile, no active chest pain.  EKG has no acute changes with the patient.  Chest x-ray shows chronic interstitial prominence.  Blood work shows baseline CKD.  Respiratory panel is negative.  Troponin is slightly elevated,  baseline and flat.  BNP is slightly elevated from baseline.  Clinical picture appears to be mild CHF exacerbation.  Patient was given a dose of IV diuretics with good diuresis.  Nasal cannula was titrated down to her home requirement of 4 L.  She has been oxygenating greater than 92% at rest.  She is ambulated with the daughter without any difficulty/symptoms.  Patient feels improved and is requesting to be discharged.  Lower suspicion for PE at this time, especially with her being on Eliquis and compliant.  No indication for emergent CT at this time however I discussed with the patient and daughter that if her symptoms worsen or change this would be an indication to come back to the emergency department for further evaluation.  Patient at this time appears safe and stable for discharge and close outpatient follow up. Discharge plan and strict return to ED precautions discussed, patient verbalizes understanding and agreement.        Final Clinical Impression(s) / ED Diagnoses Final diagnoses:  None    Rx / DC Orders ED Discharge Orders     None         Lorelle Gibbs, DO 09/19/22 1741

## 2022-09-20 ENCOUNTER — Telehealth: Payer: Self-pay

## 2022-09-20 ENCOUNTER — Other Ambulatory Visit (HOSPITAL_COMMUNITY): Payer: Self-pay | Admitting: *Deleted

## 2022-09-20 NOTE — Telephone Encounter (Signed)
Transition Care Management Follow-up Telephone Call Date of discharge and from where: 09/19/2022   How have you been since you were released from the hospital? Pt daughter states she is doing okay, she does not need an apt at that moment.  Any questions or concerns? No  Items Reviewed: Did the pt receive and understand the discharge instructions provided? Yes  Medications obtained and verified? Yes  Other? No  Any new allergies since your discharge? No  Dietary orders reviewed? Yes Do you have support at home? Yes   Home Care and Equipment/Supplies: Were home health services ordered? no If so, what is the name of the agency? N/a  Has the agency set up a time to come to the patient's home? no Were any new equipment or medical supplies ordered?  No What is the name of the medical supply agency? N/a Were you able to get the supplies/equipment? no Do you have any questions related to the use of the equipment or supplies? No  Functional Questionnaire: (I = Independent and D = Dependent) ADLs: I/d  Bathing/Dressing- I/d  Meal Prep- I/d  Eating- I/d  Maintaining continence- I/d  Transferring/Ambulation- I/d  Managing Meds- I/d  Follow up appointments reviewed:  PCP Hospital f/u appt confirmed? No  Scheduled to see n/a on n/a @ n/a. [place] [facility] f/u appt confirmed? No  Scheduled to see n/a on n/a @ n/a. Are transportation arrangements needed? No  If their condition worsens, is the pt aware to call PCP or go to the Emergency Dept.? Yes Was the patient provided with contact information for the PCP's office or ED? Yes Was to pt encouraged to call back with questions or concerns? Yes

## 2022-09-22 MED ORDER — TYVASO DPI TITRATION KIT 16 & 32 & 48 MCG IN POWD: RESPIRATORY_TRACT | 11 refills | Status: DC

## 2022-09-23 ENCOUNTER — Other Ambulatory Visit (HOSPITAL_COMMUNITY): Payer: Self-pay | Admitting: Pharmacist

## 2022-09-23 MED ORDER — TYVASO DPI MAINTENANCE KIT 48 MCG IN POWD: 48.0000 ug | Freq: Four times a day (QID) | RESPIRATORY_TRACT | 11 refills | Status: DC

## 2022-09-24 ENCOUNTER — Other Ambulatory Visit (HOSPITAL_COMMUNITY): Payer: Self-pay | Admitting: *Deleted

## 2022-09-24 MED ORDER — TYVASO DPI MAINTENANCE KIT 48 MCG IN POWD: 48.0000 ug | Freq: Four times a day (QID) | RESPIRATORY_TRACT | 11 refills | Status: DC

## 2022-09-25 ENCOUNTER — Other Ambulatory Visit (HOSPITAL_COMMUNITY): Payer: Self-pay | Admitting: Cardiology

## 2022-10-10 ENCOUNTER — Encounter: Payer: Self-pay | Admitting: Nurse Practitioner

## 2022-10-10 ENCOUNTER — Encounter (HOSPITAL_COMMUNITY): Payer: Self-pay | Admitting: Cardiology

## 2022-10-13 ENCOUNTER — Other Ambulatory Visit: Payer: Self-pay | Admitting: Nurse Practitioner

## 2022-10-15 ENCOUNTER — Ambulatory Visit (HOSPITAL_COMMUNITY)
Admission: RE | Admit: 2022-10-15 | Discharge: 2022-10-15 | Disposition: A | Discharge status: Home / Self Care | Payer: Medicare Other | Source: Ambulatory Visit | Attending: Cardiology | Admitting: Cardiology

## 2022-10-15 VITALS — BP 110/68 | HR 83 | Wt 115.0 lb

## 2022-10-15 DIAGNOSIS — Z7984 Long term (current) use of oral hypoglycemic drugs: Secondary | ICD-10-CM | POA: Diagnosis not present

## 2022-10-15 DIAGNOSIS — Z7901 Long term (current) use of anticoagulants: Secondary | ICD-10-CM | POA: Diagnosis not present

## 2022-10-15 DIAGNOSIS — Z87891 Personal history of nicotine dependence: Secondary | ICD-10-CM | POA: Insufficient documentation

## 2022-10-15 DIAGNOSIS — I272 Pulmonary hypertension, unspecified: Secondary | ICD-10-CM

## 2022-10-15 DIAGNOSIS — J439 Emphysema, unspecified: Secondary | ICD-10-CM | POA: Insufficient documentation

## 2022-10-15 DIAGNOSIS — Z85118 Personal history of other malignant neoplasm of bronchus and lung: Secondary | ICD-10-CM | POA: Diagnosis not present

## 2022-10-15 DIAGNOSIS — Z86711 Personal history of pulmonary embolism: Secondary | ICD-10-CM | POA: Diagnosis not present

## 2022-10-15 DIAGNOSIS — Z9981 Dependence on supplemental oxygen: Secondary | ICD-10-CM | POA: Diagnosis not present

## 2022-10-15 DIAGNOSIS — I5032 Chronic diastolic (congestive) heart failure: Secondary | ICD-10-CM | POA: Insufficient documentation

## 2022-10-15 DIAGNOSIS — I2721 Secondary pulmonary arterial hypertension: Secondary | ICD-10-CM | POA: Insufficient documentation

## 2022-10-15 DIAGNOSIS — I50810 Right heart failure, unspecified: Secondary | ICD-10-CM

## 2022-10-15 DIAGNOSIS — Z79899 Other long term (current) drug therapy: Secondary | ICD-10-CM | POA: Insufficient documentation

## 2022-10-15 LAB — BASIC METABOLIC PANEL
Anion gap: 11 (ref 5–15)
BUN: 27 mg/dL — ABNORMAL HIGH (ref 8–23)
CO2: 25 mmol/L (ref 22–32)
Calcium: 9.4 mg/dL (ref 8.9–10.3)
Chloride: 99 mmol/L (ref 98–111)
Creatinine, Ser: 1.2 mg/dL — ABNORMAL HIGH (ref 0.44–1.00)
GFR, Estimated: 45 mL/min — ABNORMAL LOW (ref >60)
Glucose, Bld: 147 mg/dL — ABNORMAL HIGH (ref 70–99)
Potassium: 4.6 mmol/L (ref 3.5–5.1)
Sodium: 135 mmol/L (ref 135–145)

## 2022-10-15 LAB — BRAIN NATRIURETIC PEPTIDE: B Natriuretic Peptide: 360.4 pg/mL — ABNORMAL HIGH (ref 0.0–100.0)

## 2022-10-15 MED ORDER — TYVASO DPI MAINTENANCE KIT 48 MCG IN POWD: 32.0000 ug | Freq: Four times a day (QID) | RESPIRATORY_TRACT | 11 refills | Status: DC

## 2022-10-15 MED ORDER — TADALAFIL (PAH) 20 MG PO TABS: 40.0000 mg | ORAL_TABLET | Freq: Every day | ORAL | 3 refills | Status: DC

## 2022-10-15 MED ORDER — TORSEMIDE 20 MG PO TABS: 30.0000 mg | ORAL_TABLET | Freq: Every day | ORAL | 1 refills | Status: DC

## 2022-10-15 NOTE — Patient Instructions (Signed)
INCREASE Tadalafil to 40 mg daily.  INCREASE Torsemide to 30 mg (1 1/2 tab) daily  DECREASE Tyvaso to 63mcg   Labs done today, your results will be available in MyChart, we will contact you for abnormal readings.  Repeat blood work in 10 days.  Your physician recommends that you schedule a follow-up appointment in: 1 month  If you have any questions or concerns before your next appointment please send [place] a message through [place] or call our office at [phone].    TO LEAVE A MESSAGE FOR THE NURSE SELECT OPTION 2, PLEASE LEAVE A MESSAGE INCLUDING: YOUR NAME DATE OF BIRTH CALL BACK NUMBER REASON FOR CALL**this is important as we prioritize the call backs  YOU WILL RECEIVE A CALL BACK THE SAME DAY AS LONG AS YOU CALL BEFORE 4:00 PM  At the [facility], you and your health needs are our priority. As part of our continuing mission to provide you with exceptional heart care, we have created designated Provider Care Teams. These Care Teams include your primary Cardiologist (physician) and Advanced Practice Providers (APPs- Physician Assistants and Nurse Practitioners) who all work together to provide you with the care you need, when you need it.   You may see any of the following providers on your designated Care Team at your next follow up: Dr Glori Bickers Dr Loralie Champagne Dr. Roxana Hires, NP Lyda Jester, [place] [facility] [place], [place] Forestine Na, NP Audry Riles, PharmD   Please be sure to bring in all your medications bottles to every appointment.    Thank you for choosing [facility]

## 2022-10-15 NOTE — Progress Notes (Signed)
PCP: [name], [name] Cardiology: Dr. Gardiner Rhyme HF Cardiology: Dr. [name]  82 y.o. with history of COPD/emphysema, prior PE in 6/21, lung cancer, and pulmonary hypertension was referred by Dr. Gardiner Rhyme for evaluation of pulmonary hypertension. Patient had right lower lobe lung resection for cancer in 2018 then radiation on the left side in 2019.  CT chest in 5/23 showed advanced emphysema with radiation scarring left upper lobe. She is on home oxygen 4L [place].  Patient was admitted in 4/22 with CHF, echo at that time showed EF 55-60%, moderate RV enlargement with PASP 85 mmHg.  Echo repeated in 2/23 showed EF 60-65%, mild LVH, D-shaped septum, moderate RV dysfunction and moderate RV enlargement, PASP 90 mmHg, mild-moderate TR.  Subsequently RHC was done in 2/23 showing normal RA pressure but significantly elevated PCWP and severe pulmonary hypertension.  This appeared to be mixed pulmonary venous/pulmonary arterial hypertension.   [place] 7/23 showed normal filling pressures and severe pulmonary arterial hypertension. V/Q scan was negative for chronic PEs.   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  Echo bubble study was positive.  Repeat RHC showed moderate-severe PAH with no step up in oxygen saturation to suggest significant left=>right shunting.    She returns for followup of pulmonary hypertension.  She is using 4L home oxygen.  She has felt worse since titrating up Tyvaso to 64 mcg and recently decreased back to 48 mcg, still feels worse than baseline.  Weight is down 5 lbs.  She took extra Lasix on Sunday due to peripheral edema. She is short of breath walking short distances around the house, breathing overall worse.  No chest pain, no lightheadedness/syncope.  She was seen by rheumatology (I do not have a copy of the notes); daughter says she was not thought to have a rheumatologic diagnosis.   Labs (5/23): K 5.1, creatinine 0.93 Labs (8/23): K 4.0, creatinine 1.02 Labs (10/23): K 3.9,  creatinine 1.09 Labs (1/24): BNP 396, K 4.6, creatinine 1.46  ECG (personally reviewed): NSR, right axis deviation, nonspecific T wave flattening  PMH: 1. COPD: Emphysema.  Prior smoker.  - on home oxygen - CT chest (5/23): Advanced emphysema, radiation scarring left upper lobe.  2. H/o PE in 6/21 3. Type 2 diabetes  4. Lung cancer s/p RLL resection in 2018 and radiation in 2019.  5. Pulmonary hypertension: Echo (4/22) with EF 55-60%, moderate RV enlargement with PASP 85 mmHg.  - Echo (2/23) with EF 60-65%, mild LVH, D-shaped septum, moderate RV dysfunction and moderate RV enlargement, PASP 90 mmHg, mild-moderate TR.  - RHC (2/23): mean RA 2, PA 75/21 mean 42, mean PCWP 24, CI 3.37, PVR 3.3 WU - RHC (7/23): mean RA 6, PA 77/26, mean 44, mean PCWP 7, CI 2.37, PVR 10 WU - V/Q scan (7/23): No chronic PE - ANA+, anti-RNP+ - RHC (9/23): mean RA 4, PA 68/23 mean 40, mean PCWP 11, CI 2.12, PVR 8.6 WU.  No step-up in oxygen saturation on shunt run.  - Echo (9/23): EF 55-60%, positive bubble study, D-shaped septum with moderate RV enlargement and moderately decreased RV systolic function, PASP 64 mmHg.   ROS: All systems reviewed and negative except as per HPI.   Social History   Socioeconomic History   Marital status: Widowed    Spouse name: Not on file   Number of children: 1   Years of education: Not on file   Highest education level: Master's degree (e.g., MA, MS, MEng, MEd, MSW, MBA)  Occupational History  Occupation: retired    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r-sr Pharmacist, hospital  Tobacco Use   Smoking status: Former    Packs/day: 1.00    Years: 58.00    Total pack years: 58.00    Types: Cigarettes    Quit date: 10/22/2015    Years since quitting: 6.9    Passive exposure: Past   Smokeless tobacco: Never   Tobacco comments:    congratulated  Vaping Use   Vaping Use: Never used  Substance and Sexual Activity   Alcohol use: Never   Drug use: Never   Sexual activity: Not Currently    Birth  control/protection: Surgical    Comment: HYSTERECTOMY  Other Topics Concern   Not on file  Social History Narrative   Not on file   Social Determinants of Health   Financial Resource Strain: Low Risk  (10/21/2021)   Overall Financial Resource Strain (CARDIA)    Difficulty of Paying Living Expenses: Not hard at all  Food Insecurity: No Food Insecurity (06/07/2022)   Hunger Vital Sign    Worried About Running Out of Food in the Last Year: Never true    [place] in the Last Year: Never true  Transportation Needs: No Transportation Needs (06/07/2022)   PRAPARE - Hydrologist (Medical): No    Lack of Transportation (Non-Medical): No  Physical Activity: Insufficiently Active (10/21/2021)   Exercise Vital Sign    Days of Exercise per Week: 3 days    Minutes of Exercise per Session: 40 min  Stress: No Stress Concern Present (10/21/2021)   [place]    Feeling of Stress : Not at all  Social Connections: Not on file  Intimate Partner Violence: Not At Risk (06/02/2022)   Humiliation, Afraid, Rape, and Kick questionnaire    Fear of Current or Ex-Partner: No    Emotionally Abused: No    Physically Abused: No    Sexually Abused: No   Family History  Problem Relation Age of Onset   Hypertension Mother    Heart attack Father    Cancer Maternal Aunt        unknown type   Stomach cancer Neg Hx    Colon cancer Neg Hx    Esophageal cancer Neg Hx    Pancreatic cancer Neg Hx    Current Outpatient Medications  Medication Sig Dispense Refill   acetaminophen (TYLENOL) 500 MG tablet Take 1,000 mg by mouth every 6 (six) hours as needed for mild pain or fever.     albuterol (VENTOLIN HFA) 108 (90 Base) MCG/ACT inhaler INHALE 2 PUFFS BY MOUTH EVERY 6 HOURS AS NEEDED FOR WHEEZE OR SHORTNESS OF BREATH 8.5 each 2   apixaban (ELIQUIS) 2.5 MG TABS tablet Take 1 tablet (2.5 mg total) by mouth 2 (two) times  daily. 60 tablet 5   beta carotene w/minerals (OCUVITE) tablet Take 1 tablet by mouth in the morning.     Budeson-Glycopyrrol-Formoterol (BREZTRI AEROSPHERE) 160-9-4.8 MCG/ACT AERO Inhale 2 puffs into the lungs in the morning and at bedtime. 10.7 g 12   CALCIUM PO Take 1,200 mg by mouth in the morning.     Cinnamon 500 MG TABS Take 1,000 mg by mouth at bedtime.     ferrous sulfate 325 (65 FE) MG EC tablet Take 1 tablet (325 mg total) by mouth daily. 90 tablet 1   JARDIANCE 10 MG TABS tablet TAKE 1 TABLET BY MOUTH EVERY DAY BEFORE BREAKFAST 30  tablet 3   KLOR-CON M20 20 MEQ tablet TAKE 1 TABLET BY MOUTH EVERY DAY 90 tablet 1   metFORMIN (GLUCOPHAGE) 1000 MG tablet TAKE 1 TABLET (1,000 MG TOTAL) BY MOUTH TWICE A DAY WITH FOOD 180 tablet 1   Multiple Vitamins-Minerals (CENTRUM SILVER 50+WOMEN PO) Take 1 tablet by mouth in the morning.     omeprazole (PRILOSEC) 40 MG capsule TAKE 1 CAPSULE BY MOUTH EVERY DAY 90 capsule 1   pravastatin (PRAVACHOL) 10 MG tablet TAKE 1 TABLET BY MOUTH EVERY DAY 90 tablet 1   Pseudoephedrine-guaiFENesin (MUCINEX D PO) Take 5 mLs by mouth at bedtime.     Spacer/Aero-Holding Dorise Bullion Use with inhaler 1 each 2   vitamin C (ASCORBIC ACID) 500 MG tablet Take 500 mg by mouth every Tuesday, Thursday, Saturday, and Sunday. In the morning.     vitamin E 180 MG (400 UNITS) capsule Take 400 Units by mouth every Monday, Wednesday, and Friday. In the morning.     tadalafil, PAH, (ADCIRCA) 20 MG tablet Take 2 tablets (40 mg total) by mouth daily. 180 tablet 3   torsemide (DEMADEX) 20 MG tablet Take 1.5 tablets (30 mg total) by mouth daily. 90 tablet 1   Treprostinil (TYVASO DPI MAINTENANCE KIT) 48 MCG POWD Inhale 32 mcg into the lungs in the morning, at noon, in the evening, and at bedtime. 112 each 11   No current facility-administered medications for this encounter.   Wt Readings from Last 3 Encounters:  10/15/22 52.2 kg (115 lb)  08/12/22 53.8 kg (118 lb 9.6 oz)  07/22/22  55.2 kg (121 lb 9.6 oz)   BP 110/68   Pulse 83   Wt 52.2 kg (115 lb)   SpO2 94%   BMI 21.03 kg/m  General: NAD, thin.  Neck: JVP 8 cm with HJR, no thyromegaly or thyroid nodule.  Lungs: Distant BS CV: Nondisplaced PMI.  Heart regular S1/S2, no S3/S4, no murmur.  No peripheral edema.  No carotid bruit.  Normal pedal pulses.  Abdomen: Soft, nontender, no hepatosplenomegaly, no distention.  Skin: Intact without lesions or rashes.  Neurologic: Alert and oriented x 3.  Psych: Normal affect. Extremities: No clubbing or cyanosis.  HEENT: Normal.   Assessment/Plan:  1. RV failure: Echo in 2/23 showed EF 60-65%, mild LVH, D-shaped septum, moderate RV dysfunction and moderate RV enlargement, PASP 90 mmHg, mild-moderate TR.  RHC in 7/23 showed normal filling pressures but severe PAH. Echo in 9/23 with  EF 55-60%, D-shaped septum, moderate RV enlargement with moderately decreased systolic function, bubble study positive, PASP 64 mmHg. NYHA class III symptoms, worse than in the past.  Possible mild volume overload, has to take extra torsemide at times.  - Increase torsemide to 30 mg daily. BMET/BNP today and BMET in 10 days.   - Continue Jardiance 10 mg daily.  2 COPD: She no longer smokes.  She has advanced emphysema by CT. She is on home oxygen 4-6L.   3. H/o PE: She is on Eliquis. No bleeding issues. 4. Pulmonary hypertension: Echo in 2/23 showed EF 60-65%, mild LVH, D-shaped septum, moderate RV dysfunction and moderate RV enlargement, PASP 90 mmHg, mild-moderate TR. RHC 7/23 showed normal right and left filling pressures and severe pulmonary arterial hypertension with PVR 10 WU.  V/Q scan not suggestive of chronic PEs.  Severe emphysema by chest CT.  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 Referred to rheumatology, saw Dr. Amil Amen and says that she was not  given a specific diagnosis. RHC in 9/23 showed moderate to severe PAH with PVR 8.6 and CI low but not markedly low at 2.1, RA and PCWP were not elevated. Given concern for possible group 1 component, tadalafil and Tyvaso were started.  She felt better on tadalafil but worse since starting Tyvaso.  My suspicion now is that [place] is predominantly group 3 and will not respond to pulmonary vasodilators.  - Would keep tadalafil at 40 mg daily.   - Would continue to wean down Tyvaso, decrease to 32 mcg today, and as long as she does not feel worse with this change, continue to titrate down to off over time.  5. Positive bubble study: Suspect PFO.  Shunt run done with RHC in 9/23, no step up. This does not suggest the presence of a significant ASD.   Followup 1 month with APP.   Loralie Champagne 10/15/2022

## 2022-10-20 ENCOUNTER — Inpatient Hospital Stay: Payer: Medicare Other | Attending: Oncology

## 2022-10-20 ENCOUNTER — Encounter: Payer: Self-pay | Admitting: Oncology

## 2022-10-20 ENCOUNTER — Inpatient Hospital Stay (HOSPITAL_BASED_OUTPATIENT_CLINIC_OR_DEPARTMENT_OTHER): Payer: Medicare Other | Admitting: Oncology

## 2022-10-20 VITALS — BP 107/58 | HR 72 | Temp 95.7°F | Resp 18 | Wt 112.6 lb

## 2022-10-20 DIAGNOSIS — Z86711 Personal history of pulmonary embolism: Secondary | ICD-10-CM

## 2022-10-20 DIAGNOSIS — E119 Type 2 diabetes mellitus without complications: Secondary | ICD-10-CM | POA: Insufficient documentation

## 2022-10-20 DIAGNOSIS — D500 Iron deficiency anemia secondary to blood loss (chronic): Secondary | ICD-10-CM

## 2022-10-20 DIAGNOSIS — Z8542 Personal history of malignant neoplasm of other parts of uterus: Secondary | ICD-10-CM | POA: Insufficient documentation

## 2022-10-20 DIAGNOSIS — Z85118 Personal history of other malignant neoplasm of bronchus and lung: Secondary | ICD-10-CM | POA: Diagnosis not present

## 2022-10-20 DIAGNOSIS — D509 Iron deficiency anemia, unspecified: Secondary | ICD-10-CM | POA: Insufficient documentation

## 2022-10-20 DIAGNOSIS — C3490 Malignant neoplasm of unspecified part of unspecified bronchus or lung: Secondary | ICD-10-CM

## 2022-10-20 DIAGNOSIS — I2609 Other pulmonary embolism with acute cor pulmonale: Secondary | ICD-10-CM

## 2022-10-20 DIAGNOSIS — Z87891 Personal history of nicotine dependence: Secondary | ICD-10-CM | POA: Insufficient documentation

## 2022-10-20 DIAGNOSIS — Z90710 Acquired absence of both cervix and uterus: Secondary | ICD-10-CM | POA: Insufficient documentation

## 2022-10-20 DIAGNOSIS — Z7901 Long term (current) use of anticoagulants: Secondary | ICD-10-CM | POA: Diagnosis not present

## 2022-10-20 LAB — COMPREHENSIVE METABOLIC PANEL
ALT: 20 U/L (ref 0–44)
AST: 22 U/L (ref 15–41)
Albumin: 4.2 g/dL (ref 3.5–5.0)
Alkaline Phosphatase: 58 U/L (ref 38–126)
Anion gap: 12 (ref 5–15)
BUN: 34 mg/dL — ABNORMAL HIGH (ref 8–23)
CO2: 26 mmol/L (ref 22–32)
Calcium: 9.5 mg/dL (ref 8.9–10.3)
Chloride: 96 mmol/L — ABNORMAL LOW (ref 98–111)
Creatinine, Ser: 1.17 mg/dL — ABNORMAL HIGH (ref 0.44–1.00)
GFR, Estimated: 47 mL/min — ABNORMAL LOW (ref >60)
Glucose, Bld: 147 mg/dL — ABNORMAL HIGH (ref 70–99)
Potassium: 4.1 mmol/L (ref 3.5–5.1)
Sodium: 134 mmol/L — ABNORMAL LOW (ref 135–145)
Total Bilirubin: 0.4 mg/dL (ref 0.3–1.2)
Total Protein: 7.7 g/dL (ref 6.5–8.1)

## 2022-10-20 LAB — CBC WITH DIFFERENTIAL/PLATELET
Abs Immature Granulocytes: 0.01 10*3/uL (ref 0.00–0.07)
Basophils Absolute: 0.1 10*3/uL (ref 0.0–0.1)
Basophils Relative: 1 %
Eosinophils Absolute: 0.1 10*3/uL (ref 0.0–0.5)
Eosinophils Relative: 3 %
HCT: 42.1 % (ref 36.0–46.0)
Hemoglobin: 13.2 g/dL (ref 12.0–15.0)
Immature Granulocytes: 0 %
Lymphocytes Relative: 29 %
Lymphs Abs: 1.3 10*3/uL (ref 0.7–4.0)
MCH: 28.3 pg (ref 26.0–34.0)
MCHC: 31.4 g/dL (ref 30.0–36.0)
MCV: 90.1 fL (ref 80.0–100.0)
Monocytes Absolute: 0.3 10*3/uL (ref 0.1–1.0)
Monocytes Relative: 7 %
Neutro Abs: 2.6 10*3/uL (ref 1.7–7.7)
Neutrophils Relative %: 60 %
Platelets: 190 10*3/uL (ref 150–400)
RBC: 4.67 MIL/uL (ref 3.87–5.11)
RDW: 14.9 % (ref 11.5–15.5)
WBC: 4.4 10*3/uL (ref 4.0–10.5)
nRBC: 0 % (ref 0.0–0.2)

## 2022-10-20 LAB — FERRITIN: Ferritin: 9 ng/mL — ABNORMAL LOW (ref 11–307)

## 2022-10-20 LAB — IRON AND TIBC
Iron: 38 ug/dL (ref 28–170)
Saturation Ratios: 8 % — ABNORMAL LOW (ref 10.4–31.8)
TIBC: 455 ug/dL — ABNORMAL HIGH (ref 250–450)
UIBC: 417 ug/dL

## 2022-10-20 MED ORDER — APIXABAN 2.5 MG PO TABS: 2.5000 mg | ORAL_TABLET | Freq: Two times a day (BID) | ORAL | 1 refills | Status: DC

## 2022-10-20 MED ORDER — FERROUS SULFATE 325 (65 FE) MG PO TBEC: 1.0000 | DELAYED_RELEASE_TABLET | Freq: Two times a day (BID) | ORAL | 1 refills | Status: DC

## 2022-10-20 NOTE — Progress Notes (Signed)
Hematology/Oncology Progress note [phone] [phone]     CHIEF COMPLAINTS/REASON FOR VISIT:   history of pulmonary embolism.  Chronic anticoagulation, iron deficiency.  ASSESSMENT & PLAN:   IDA (iron deficiency anemia) Ferritin is 9, iron saturation 8, consistent with iron deficiency. Hb is normal.  She is on oral iron supplementation daily.  Recommend patient to increase to twice daily. continue vitamin C supplementation.    Pulmonary embolism (HCC) #History of pulmonary embolism, possible provoked event, however patient has multiple other contributing factors including history of cancer, sedentary lifestyle, limited mobility due to chronic respiratory failure. Continue Eliquis 2.5mg  BID I asked patient and family to contact office if patient starts to have balancing issue/increased falls.  Squamous cell carcinoma lung, unspecified laterality ([place]) History of lung cancer Patient follows up with pulmonology for annual CT scan.- due in May 2024  Orders Placed This Encounter  Procedures   CBC with Differential ([facility] Only)    Standing Status:   Future    Standing Expiration Date:   10/21/2023   CMP ([facility] only)    Standing Status:   Future    Standing Expiration Date:   10/21/2023   Iron and TIBC    Standing Status:   Future    Standing Expiration Date:   10/21/2023   Ferritin    Standing Status:   Future    Standing Expiration Date:   10/21/2023   Follow up in 6 months.  All questions were answered. The patient knows to call the clinic with any problems, questions or concerns.  Brianna Server, MD, PhD [facility] Hematology Oncology 10/20/2022   HISTORY OF PRESENTING ILLNESS:   Brianna Adams is a  82 y.o.  female with PMH listed below was seen in consultation at the request of  Brianna Brine, [name]  for evaluation of history of pulmonary embolism.  Patient has been on oxygen via nasal cannula at home due to pulmonary hypertension.  She  also has an early stage lung cancer status post a right lower lobe resection. 02/29/2020 patient presented emergency room for evaluation of shortness of breath.  Patient was found to have segmental PE of the right lower lobe with evidence of right heart strain.  And new area of the consolidation in the left lower lobe concerning for pneumonia.  Lower extremity ultrasound was negative for DVT.   Patient also was treated for pneumonia, acute decompensated CHF.Marland Kitchen  Patient was placed on heparin and discharged on Eliquis.  Patient takes Eliquis 5 mg twice daily since then on 05/08/2021, when she developed rectal bleeding.  Eliquis was decreased to 2.5 mg twice daily.  Patient currently is on nasal cannula oxygen 4 L.  Patient was accompanied by her daughter today.  Daughter reports history of long distance car trip-6 hours  prior to patient's PE diagnosis.Daughter is not sure about exactly the timeframe of immobilization event.  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  Patient follows up with Brianna Adams for surveillance, lung emphysema, chronic respiratory failure.  Patient denies any additional episodes of rectal bleeding since decrease to Eliquis 2.5 mg twice daily. Per patient and her daughter, patient has been quite active despite chronic respiratory failure.  Per daughter, patient walks faster than her. Patient has. been referred to establish care with gastroenterology.  08/13/2021, flexible sigmoidoscopy showed diverticulosis.  Nonbleeding internal  hemorrhoids.  No specimen was collected.  INTERVAL HISTORY Brianna Adams is a 82 y.o. female who has above history reviewed by me today presents for follow up visit for  history of pulmonary embolism.  Chronic anticoagulation, iron deficiency. Patient tolerates Eliquis 2.5 mg daily.  She denies any bleeding events. She was  companied by her daughter.  chronic respiratory failure, SOB at baseline. No increase of oxygen requirement.  She reports having good balance and steady gait.  No recent falls.   Review of Systems  Constitutional:  Negative for appetite change, chills, fatigue and fever.  HENT:   Negative for hearing loss and voice change.   Eyes:  Negative for eye problems.  Respiratory:  Positive for shortness of breath. Negative for chest tightness, cough and hemoptysis.   Cardiovascular:  Negative for chest pain.  Gastrointestinal:  Negative for abdominal distention, abdominal pain and blood in stool.  Endocrine: Negative for hot flashes.  Genitourinary:  Negative for difficulty urinating and frequency.   Musculoskeletal:  Negative for arthralgias.  Skin:  Negative for itching and rash.  Neurological:  Negative for extremity weakness.  Hematological:  Negative for adenopathy.  Psychiatric/Behavioral:  Negative for confusion.     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   PE (pulmonary thromboembolism) ([place]) 02/29/2020   Pulmonary hypertension ([place])    Uterine cancer ([place])     SURGICAL HISTORY: Past Surgical History:  Procedure Laterality Date   ABDOMINAL HYSTERECTOMY     cataract surgery  2012 and 2017   COLONOSCOPY     FIBEROPTIC BRONCHOSCOPY     FLEXIBLE SIGMOIDOSCOPY N/A 08/13/2021   Procedure: FLEXIBLE SIGMOIDOSCOPY;  Surgeon: Brianna Creamer, MD;  Location: [facility];  Service: Gastroenterology;  Laterality: N/A;   GANGLION CYST EXCISION Left 09/11/2020   Procedure: EXCISION VOLAR RADIAL GANGLION OF LEFT WRIST;  Surgeon: Brianna Brod, MD;  Location:  [facility];  Service: Orthopedics;  Laterality: Left;  AXILLARY BLOCK   resection of lung cancer  2018   RIGHT HEART CATH N/A 10/20/2021   Procedure: RIGHT HEART CATH;  Surgeon: Leonie Man, MD;  Location: [facility];  Service: Cardiovascular;  Laterality: N/A;   RIGHT HEART CATH N/A 03/18/2022   Procedure: RIGHT HEART CATH;  Surgeon: Larey Dresser, MD;  Location: [facility];  Service: Cardiovascular;  Laterality: N/A;   RIGHT HEART CATH N/A 06/04/2022   Procedure: RIGHT HEART CATH;  Surgeon: Larey Dresser, MD;  Location: [facility];  Service: Cardiovascular;  Laterality: N/A;   right lower lobe non-anatomocal lung resection wedge     right thorascoscopy      SOCIAL HISTORY: 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r-sr Pharmacist, hospital  Tobacco Use   Smoking status: Former  Packs/day: 1.00    Years: 58.00    Total pack years: 58.00    Types: Cigarettes    Quit date: 10/22/2015    Years since quitting: 7.0    Passive exposure: Past   Smokeless tobacco: Never   Tobacco comments:    congratulated  Scientific laboratory technician Use: Never used  Substance and Sexual Activity   Alcohol use: Never   Drug use: Never   Sexual activity: Not Currently    Birth control/protection: Surgical    Comment: HYSTERECTOMY  Other Topics Concern   Not on file  Social History Narrative   Not on file   Social Determinants of Health   Financial Resource Strain: Low Risk  (10/21/2021)   Overall Financial Resource Strain (CARDIA)    Difficulty of Paying Living Expenses: Not hard at all  Food Insecurity: No Food Insecurity (06/07/2022)   Hunger Vital Sign    Worried About Running Out of Food in the Last Year: Never true    Ran Out  of Food in the Last Year: Never true  Transportation Needs: No Transportation Needs (06/07/2022)   PRAPARE - Hydrologist (Medical): No    Lack of Transportation (Non-Medical): No  Physical Activity: Insufficiently Active (10/21/2021)   Exercise Vital Sign    Days of Exercise per Week: 3 days    Minutes of Exercise per Session: 40 min  Stress: No Stress Concern Present (10/21/2021)   [place]    Feeling of Stress : Not at all  Social Connections: Not on file  Intimate Partner Violence: Not At Risk (06/02/2022)   Humiliation, Afraid, Rape, and Kick questionnaire    Fear of Current or Ex-Partner: No    Emotionally Abused: No    Physically Abused: No    Sexually Abused: No    FAMILY HISTORY: Family History  Problem Relation Age of Onset   Hypertension Mother    Heart attack Father    Cancer Maternal Aunt        unknown type   Stomach cancer Neg Hx    Colon cancer Neg Hx    Esophageal cancer Neg Hx    Pancreatic cancer Neg Hx     ALLERGIES:  is allergic to ace inhibitors, penicillin g, zestril [lisinopril], cortisone, hydrocortisone, lipitor [atorvastatin], mobic [meloxicam], and pravachol [pravastatin].  MEDICATIONS:  Current Outpatient Medications  Medication Sig Dispense Refill   acetaminophen (TYLENOL) 500 MG tablet Take 1,000 mg by mouth every 6 (six) hours as needed for mild pain or fever.     albuterol (VENTOLIN HFA) 108 (90 Base) MCG/ACT inhaler INHALE 2 PUFFS BY MOUTH EVERY 6 HOURS AS NEEDED FOR WHEEZE OR SHORTNESS OF BREATH 8.5 each 2   beta carotene w/minerals (OCUVITE) tablet Take 1 tablet by mouth in the morning.     Budeson-Glycopyrrol-Formoterol (BREZTRI AEROSPHERE) 160-9-4.8 MCG/ACT AERO Inhale 2 puffs into the lungs in the morning and at bedtime. 10.7 g 12   CALCIUM PO Take 1,200 mg by mouth in the morning.     Cinnamon 500 MG TABS Take 1,000 mg by mouth at bedtime.      ferrous sulfate 325 (65 FE) MG EC tablet Take 1 tablet (325 mg total) by mouth daily. 90 tablet 1   JARDIANCE 10 MG TABS tablet TAKE 1 TABLET BY MOUTH EVERY DAY BEFORE BREAKFAST 30 tablet 3   KLOR-CON M20 20 MEQ tablet TAKE 1 TABLET BY MOUTH EVERY DAY  90 tablet 1   metFORMIN (GLUCOPHAGE) 1000 MG tablet TAKE 1 TABLET (1,000 MG TOTAL) BY MOUTH TWICE A DAY WITH FOOD 180 tablet 1   Multiple Vitamins-Minerals (CENTRUM SILVER 50+WOMEN PO) Take 1 tablet by mouth in the morning.     omeprazole (PRILOSEC) 40 MG capsule TAKE 1 CAPSULE BY MOUTH EVERY DAY 90 capsule 1   pravastatin (PRAVACHOL) 10 MG tablet TAKE 1 TABLET BY MOUTH EVERY DAY 90 tablet 1   Pseudoephedrine-guaiFENesin (MUCINEX D PO) Take 5 mLs by mouth at bedtime.     Spacer/Aero-Holding Dorise Bullion Use with inhaler 1 each 2   tadalafil, PAH, (ADCIRCA) 20 MG tablet Take 2 tablets (40 mg total) by mouth daily. 180 tablet 3   torsemide (DEMADEX) 20 MG tablet Take 1.5 tablets (30 mg total) by mouth daily. 90 tablet 1   Treprostinil (TYVASO DPI MAINTENANCE KIT) 48 MCG POWD Inhale 32 mcg into the lungs in the morning, at noon, in the evening, and at bedtime. 112 each 11   vitamin C (ASCORBIC ACID) 500 MG tablet Take 500 mg by mouth every Tuesday, Thursday, Saturday, and Sunday. In the morning.     vitamin E 180 MG (400 UNITS) capsule Take 400 Units by mouth every Monday, Wednesday, and Friday. In the morning.     apixaban (ELIQUIS) 2.5 MG TABS tablet Take 1 tablet (2.5 mg total) by mouth 2 (two) times daily. 180 tablet 1   No current facility-administered medications for this visit.     PHYSICAL EXAMINATION: ECOG PERFORMANCE STATUS: 1 - Symptomatic but completely ambulatory Vitals:   10/20/22 0957  BP: (!) 107/58  Pulse: 72  Resp: 18  Temp: (!) 95.7 F (35.4 C)  SpO2: 95%   Filed Weights   10/20/22 0957  Weight: 112 lb 9.6 oz (51.1 kg)    Physical Exam Constitutional:      General: She is not in acute distress.    Comments:  Patient sits in the wheelchair  HENT:     Head: Normocephalic and atraumatic.  Eyes:     General: No scleral icterus. Cardiovascular:     Rate and Rhythm: Normal rate and regular rhythm.     Heart sounds: Normal heart sounds.  Pulmonary:     Effort: Pulmonary effort is normal. No respiratory distress.     Breath sounds: No wheezing.     Comments: Decreased breath sound bilaterally.  Patient breathes comfortably via nasal cannula oxygen Abdominal:     General: Bowel sounds are normal. There is no distension.     Palpations: Abdomen is soft.  Musculoskeletal:        General: No deformity. Normal range of motion.     Cervical back: Normal range of motion and neck supple.  Skin:    General: Skin is warm and dry.     Findings: No erythema or rash.  Neurological:     Mental Status: She is alert and oriented to person, place, and time. Mental status is at baseline.     Cranial Nerves: No cranial nerve deficit.     Coordination: Coordination normal.  Psychiatric:        Mood and Affect: Mood normal.     LABORATORY DATA:  I have reviewed the data as listed    Latest Ref Rng & Units 10/20/2022    9:38 AM 09/19/2022   11:32 AM 08/26/2022   10:40 AM  CBC  WBC 4.0 - 10.5 K/uL 4.4  5.2  4.9   Hemoglobin 12.0 -  15.0 g/dL 13.2  13.6  12.1   Hematocrit 36.0 - 46.0 % 42.1  42.6  39.1   Platelets 150 - 400 K/uL 190  319  289       Latest Ref Rng & Units 10/20/2022    9:38 AM 10/15/2022    9:14 AM 09/19/2022   11:32 AM  CMP  Glucose 70 - 99 mg/dL 147  147  204   BUN 8 - 23 mg/dL 34  27  28   Creatinine 0.44 - 1.00 mg/dL 1.17  1.20  1.46   Sodium 135 - 145 mmol/L 134  135  134   Potassium 3.5 - 5.1 mmol/L 4.1  4.6  4.6   Chloride 98 - 111 mmol/L 96  99  98   CO2 22 - 32 mmol/L 26  25  23    Calcium 8.9 - 10.3 mg/dL 9.5  9.4  9.5   Total Protein 6.5 - 8.1 g/dL 7.7   7.4   Total Bilirubin 0.3 - 1.2 mg/dL 0.4   0.6   Alkaline Phos 38 - 126 U/L 58   50   AST 15 - 41 U/L 22   24   ALT 0 -  44 U/L 20   29      Iron/TIBC/Ferritin/ %Sat    Component Value Date/Time   IRON 38 10/20/2022 0938   TIBC 455 (H) 10/20/2022 0938   FERRITIN 9 (L) 10/20/2022 0938   IRONPCTSAT 8 (L) 10/20/2022 3299       RADIOGRAPHIC STUDIES: I have personally reviewed the radiological images as listed and agreed with the findings in the report. DG Chest Port 1 View  Result Date: 09/19/2022 CLINICAL DATA:  Shortness of breath. EXAM: PORTABLE CHEST 1 VIEW COMPARISON:  06/02/2022 FINDINGS: The heart size and mediastinal contours are within normal limits. Scarring in left upper lobe remains stable. Diffuse pulmonary interstitial prominence is unchanged. No evidence of acute infiltrate or pleural effusion. IMPRESSION: Stable left upper lobe scarring and chronic interstitial prominence. No acute findings. Electronically Signed   By: Marlaine Hind M.D.   On: 09/19/2022 12:22

## 2022-10-20 NOTE — Assessment & Plan Note (Addendum)
#  History of pulmonary embolism, possible provoked event, however patient has multiple other contributing factors including history of cancer, sedentary lifestyle, limited mobility due to chronic respiratory failure. Continue Eliquis 2.5mg  BID I asked patient and family to contact office if patient starts to have balancing issue/increased falls.

## 2022-10-20 NOTE — Assessment & Plan Note (Signed)
History of lung cancer Patient follows up with pulmonology for annual CT scan.- due in May 2024

## 2022-10-20 NOTE — Assessment & Plan Note (Addendum)
Ferritin is 9, iron saturation 8, consistent with iron deficiency. Hb is normal.  She is on oral iron supplementation daily.  Recommend patient to increase to twice daily. continue vitamin C supplementation.

## 2022-10-21 ENCOUNTER — Other Ambulatory Visit: Payer: Medicare Other

## 2022-10-21 ENCOUNTER — Ambulatory Visit: Payer: Medicare Other | Admitting: Oncology

## 2022-10-25 ENCOUNTER — Ambulatory Visit (HOSPITAL_COMMUNITY)
Admission: RE | Admit: 2022-10-25 | Discharge: 2022-10-25 | Disposition: A | Discharge status: Home / Self Care | Payer: Medicare Other | Source: Ambulatory Visit | Attending: Cardiology | Admitting: Cardiology

## 2022-10-25 DIAGNOSIS — I5032 Chronic diastolic (congestive) heart failure: Secondary | ICD-10-CM | POA: Insufficient documentation

## 2022-10-25 LAB — BASIC METABOLIC PANEL
Anion gap: 14 (ref 5–15)
BUN: 35 mg/dL — ABNORMAL HIGH (ref 8–23)
CO2: 24 mmol/L (ref 22–32)
Calcium: 9.6 mg/dL (ref 8.9–10.3)
Chloride: 97 mmol/L — ABNORMAL LOW (ref 98–111)
Creatinine, Ser: 1.32 mg/dL — ABNORMAL HIGH (ref 0.44–1.00)
GFR, Estimated: 40 mL/min — ABNORMAL LOW (ref >60)
Glucose, Bld: 139 mg/dL — ABNORMAL HIGH (ref 70–99)
Potassium: 4 mmol/L (ref 3.5–5.1)
Sodium: 135 mmol/L (ref 135–145)

## 2022-10-26 ENCOUNTER — Other Ambulatory Visit: Payer: Self-pay | Admitting: Nurse Practitioner

## 2022-10-26 DIAGNOSIS — J449 Chronic obstructive pulmonary disease, unspecified: Secondary | ICD-10-CM

## 2022-10-26 DIAGNOSIS — I272 Pulmonary hypertension, unspecified: Secondary | ICD-10-CM

## 2022-10-26 DIAGNOSIS — E113293 Type 2 diabetes mellitus with mild nonproliferative diabetic retinopathy without macular edema, bilateral: Secondary | ICD-10-CM

## 2022-10-26 DIAGNOSIS — I5032 Chronic diastolic (congestive) heart failure: Secondary | ICD-10-CM

## 2022-10-27 ENCOUNTER — Other Ambulatory Visit: Payer: Self-pay | Admitting: Nurse Practitioner

## 2022-10-27 DIAGNOSIS — R051 Acute cough: Secondary | ICD-10-CM

## 2022-10-29 ENCOUNTER — Other Ambulatory Visit: Payer: Self-pay | Admitting: Nurse Practitioner

## 2022-10-29 DIAGNOSIS — E119 Type 2 diabetes mellitus without complications: Secondary | ICD-10-CM

## 2022-11-02 ENCOUNTER — Encounter (HOSPITAL_COMMUNITY): Payer: Medicare Other | Admitting: Cardiology

## 2022-11-08 ENCOUNTER — Telehealth: Payer: Self-pay

## 2022-11-08 NOTE — Progress Notes (Signed)
  Chronic Care Management   Note  11/08/2022 Name: Brianna Adams MRN: [id number] DOB: 1941-09-05  Brianna Adams is a 82 y.o. year old female who is a primary care patient of Brianna Adams, Brianna Adams. I reached out to Brianna Adams by phone today in response to a referral sent by Brianna Adams PCP.  Brianna Adams was given information about Chronic Care Management services today including:  CCM service includes personalized support from designated clinical staff supervised by the physician, including individualized plan of care and coordination with other care providers 24/7 contact phone numbers for assistance for urgent and routine care needs. Service will only be billed when office clinical staff spend 20 minutes or more in a month to coordinate care. Only one practitioner may furnish and bill the service in a calendar month. The patient may stop CCM services at amy time (effective at the end of the month) by phone call to the office staff. The patient will be responsible for cost sharing (co-pay) or up to 20% of the service fee (after annual deductible is met)  Brianna Adams  declinedto scheduling an appointment with the CCM RN Case Manager and Pharm d   Follow up plan: Patient did not agree to scheduling an appointment with the RN Case Manager and Pharm d . The ordering provider has been notified.   Brianna Adams, [place], [place] [address] Direct Dial: [phone] [email or web address]

## 2022-11-09 NOTE — Progress Notes (Signed)
PCP: Arnette Felts, [name] Cardiology: Dr. Bjorn Pippin HF Cardiology: Dr. Shirlee Latch  82 y.o. with history of COPD/emphysema, prior PE in 6/21, lung cancer, and pulmonary hypertension was referred by Dr. Bjorn Pippin for evaluation of pulmonary hypertension. Patient had right lower lobe lung resection for cancer in 2018 then radiation on the left side in 2019.  CT chest in 5/23 showed advanced emphysema with radiation scarring left upper lobe. She is on home oxygen 4L [place].  Patient was admitted in 4/22 with CHF, echo at that time showed EF 55-60%, moderate RV enlargement with PASP 85 mmHg.  Echo repeated in 2/23 showed EF 60-65%, mild LVH, D-shaped septum, moderate RV dysfunction and moderate RV enlargement, PASP 90 mmHg, mild-moderate TR.  Subsequently RHC was done in 2/23 showing normal RA pressure but significantly elevated PCWP and severe pulmonary hypertension.  This appeared to be mixed pulmonary venous/pulmonary arterial hypertension.   RHC 7/23 showed normal filling pressures and severe pulmonary arterial hypertension. V/Q scan was negative for chronic PEs.   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  Echo bubble study was positive.  Repeat RHC showed moderate-severe PAH with no step up in oxygen saturation to suggest significant left=>right shunting.    She was seen by rheumatology (I do not have a copy of the notes); daughter says she was not thought to have a rheumatologic diagnosis.   She returns for followup of pulmonary hypertension.  She is using 4L home oxygen.  She has felt worse since titrating up Tyvaso to 64 mcg and we have been gradually trying to wean to off. Last OV, Dr. Shirlee Latch further reduced to 32 mcg. She was also felt to be mildly fluid overloaded last visit and torsemide was increased to 30 mg daily.   Here w/ her 2 daughters today. Reports feeling better. Breathing improved and stable on 4L [place]. Wt down 4 lb w/ torsemide increase. BP soft 94/52. She reports occasional dizziness if she  stands too quickly but no syncope/ near syncope. No LEE or orthopnea. Remains active despite age. Enrolled in [employer] program doing aerobic exercise. Traveling to [facility] tomorrow.    Labs (5/23): K 5.1, creatinine 0.93 Labs (8/23): K 4.0, creatinine 1.02 Labs (10/23): K 3.9, creatinine 1.09 Labs (1/24): BNP 396, K 4.6, creatinine 1.46 Labs (2/24): K 4.0, creatinine 1.32   ECG: not performed   PMH: 1. COPD: Emphysema.  Prior smoker.  - on home oxygen - CT chest (5/23): Advanced emphysema, radiation scarring left upper lobe.  2. H/o PE in 6/21 3. Type 2 diabetes  4. Lung cancer s/p RLL resection in 2018 and radiation in 2019.  5. Pulmonary hypertension: Echo (4/22) with EF 55-60%, moderate RV enlargement with PASP 85 mmHg.  - Echo (2/23) with EF 60-65%, mild LVH, D-shaped septum, moderate RV dysfunction and moderate RV enlargement, PASP 90 mmHg, mild-moderate TR.  - RHC (2/23): mean RA 2, PA 75/21 mean 42, mean PCWP 24, CI 3.37, PVR 3.3 WU - RHC (7/23): mean RA 6, PA 77/26, mean 44, mean PCWP 7, CI 2.37, PVR 10 WU - V/Q scan (7/23): No chronic PE - ANA+, anti-RNP+ - RHC (9/23): mean RA 4, PA 68/23 mean 40, mean PCWP 11, CI 2.12, PVR 8.6 WU.  No step-up in oxygen saturation on shunt run.  - Echo (9/23): EF 55-60%, positive bubble study, D-shaped septum with moderate RV enlargement and moderately decreased RV systolic function, PASP 64 mmHg.   ROS: All systems reviewed and negative except as per  HPI.   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, broadcasting/film/video  Tobacco Use   Smoking status: Former    Packs/day: 1.00    Years: 58.00    Total pack years: 58.00    Types: Cigarettes    Quit date: 10/22/2015    Years  since quitting: 7.0    Passive exposure: Past   Smokeless tobacco: Never   Tobacco comments:    congratulated  Vaping Use   Vaping Use: Never used  Substance and Sexual Activity   Alcohol use: Never   Drug use: Never   Sexual activity: Not Currently    Birth control/protection: Surgical    Comment: HYSTERECTOMY  Other Topics Concern   Not on file  Social History Narrative   Not on file   Social Determinants of Health   Financial Resource Strain: Low Risk  (10/21/2021)   Overall Financial Resource Strain (CARDIA)    Difficulty of Paying Living Expenses: Not hard at all  Food Insecurity: No Food Insecurity (06/07/2022)   Hunger Vital Sign    Worried About Running Out of Food in the Last Year: Never true    Ran Out of Food in the Last Year: Never true  Transportation Needs: No Transportation Needs (06/07/2022)   PRAPARE - Administrator, Civil Service (Medical): No    Lack of Transportation (Non-Medical): No  Physical Activity: Insufficiently Active (10/21/2021)   Exercise Vital Sign    Days of Exercise per Week: 3 days    Minutes of Exercise per Session: 40 min  Stress: No Stress Concern Present (10/21/2021)   [employer] of Occupational Health - Occupational Stress Questionnaire    Feeling of Stress : Not at all  Social Connections: Not on file  Intimate Partner Violence: Not At Risk (06/02/2022)   Humiliation, Afraid, Rape, and Kick questionnaire    Fear of Current or Ex-Partner: No    Emotionally Abused: No    Physically Abused: No    Sexually Abused: No   Family History  Problem Relation Age of Onset   Hypertension Mother    Heart attack Father    Cancer Maternal Aunt        unknown type   Stomach cancer Neg Hx    Colon cancer Neg Hx    Esophageal cancer Neg Hx    Pancreatic cancer Neg Hx    Current Outpatient Medications  Medication Sig Dispense Refill   acetaminophen (TYLENOL) 500 MG tablet Take 1,000 mg by mouth every 6 (six) hours as needed  for mild pain or fever.     albuterol (VENTOLIN HFA) 108 (90 Base) MCG/ACT inhaler INHALE 2 PUFFS BY MOUTH EVERY 6 HOURS AS NEEDED FOR WHEEZE OR SHORTNESS OF BREATH 8.5 each 2   apixaban (ELIQUIS) 2.5 MG TABS tablet Take 1 tablet (2.5 mg total) by mouth 2 (two) times daily. 180 tablet 1   beta carotene w/minerals (OCUVITE) tablet Take 1 tablet by mouth in the morning.     Budeson-Glycopyrrol-Formoterol (BREZTRI AEROSPHERE) 160-9-4.8 MCG/ACT AERO Inhale 2 puffs into the lungs in the morning and at bedtime. (Patient taking differently: Inhale 2 puffs into the lungs once.) 10.7 g 12   CALCIUM  PO Take 1,200 mg by mouth in the morning.     Cinnamon 500 MG TABS Take 1,000 mg by mouth at bedtime.     ferrous sulfate 325 (65 FE) MG EC tablet Take 1 tablet (325 mg total) by mouth 2 (two) times daily with a meal. 180 tablet 1   JARDIANCE 10 MG TABS tablet TAKE 1 TABLET BY MOUTH EVERY DAY BEFORE BREAKFAST 30 tablet 3   KLOR-CON M20 20 MEQ tablet TAKE 1 TABLET BY MOUTH EVERY DAY 90 tablet 1   metFORMIN (GLUCOPHAGE) 1000 MG tablet TAKE 1 TABLET (1,000 MG TOTAL) BY MOUTH TWICE A DAY WITH FOOD 180 tablet 1   Multiple Vitamins-Minerals (CENTRUM SILVER 50+WOMEN PO) Take 1 tablet by mouth in the morning.     omeprazole (PRILOSEC) 40 MG capsule TAKE 1 CAPSULE BY MOUTH EVERY DAY 90 capsule 1   pravastatin (PRAVACHOL) 10 MG tablet TAKE 1 TABLET BY MOUTH EVERY DAY 90 tablet 1   Pseudoephedrine-guaiFENesin (MUCINEX D PO) Take 5 mLs by mouth at bedtime.     Spacer/Aero-Holding Rudean Curt Use with inhaler 1 each 2   tadalafil, PAH, (ADCIRCA) 20 MG tablet Take 2 tablets (40 mg total) by mouth daily. 180 tablet 3   torsemide (DEMADEX) 20 MG tablet Take 1.5 tablets (30 mg total) by mouth daily. 90 tablet 1   Treprostinil (TYVASO DPI MAINTENANCE KIT) 48 MCG POWD Inhale 32 mcg into the lungs in the morning, at noon, in the evening, and at bedtime. 112 each 11   vitamin C (ASCORBIC ACID) 500 MG tablet Take 500 mg by mouth  every Tuesday, Thursday, Saturday, and Sunday. In the morning.     vitamin E 180 MG (400 UNITS) capsule Take 400 Units by mouth every Monday, Wednesday, and Friday. In the morning.     No current facility-administered medications for this encounter.   Wt Readings from Last 3 Encounters:  11/10/22 50.5 kg (111 lb 6.4 oz)  10/20/22 51.1 kg (112 lb 9.6 oz)  10/15/22 52.2 kg (115 lb)   BP (!) 94/52   Pulse 99   Wt 50.5 kg (111 lb 6.4 oz)   SpO2 (!) 82%   BMI 20.38 kg/m  PHYSICAL EXAM: General:  Well appearing, elderly in WC. No respiratory difficulty HEENT: normal Neck: supple. JVD 7-8 cm. Carotids 2+ bilat; no bruits. No lymphadenopathy or thyromegaly appreciated. Cor: PMI nondisplaced. Regular rate & rhythm. No rubs, gallops or murmurs. Lungs: clear Abdomen: soft, nontender, nondistended. No hepatosplenomegaly. No bruits or masses. Good bowel sounds. Extremities: no cyanosis, clubbing, rash, edema Neuro: alert & oriented x 3, cranial nerves grossly intact. moves all 4 extremities w/o difficulty. Affect pleasant.   Assessment/Plan:  1. RV failure: Echo in 2/23 showed EF 60-65%, mild LVH, D-shaped septum, moderate RV dysfunction and moderate RV enlargement, PASP 90 mmHg, mild-moderate TR.  RHC in 7/23 showed normal filling pressures but severe PAH. Echo in 9/23 with  EF 55-60%, D-shaped septum, moderate RV enlargement with moderately decreased systolic function, bubble study positive, PASP 64 mmHg. Stable NYHA class II-early III symptoms, overall improved w/ recent torsemide increase - Continue Torsemide to 30 mg daily.  - Continue Jardiance 10 mg daily.  - Check BMP and BNP today  2 COPD: She no longer smokes.  She has advanced emphysema by CT. She is on home oxygen 4 L (stable)  3. H/o PE: She is on Eliquis. denies bleeding issues. - check CBC today  4. Pulmonary hypertension: Echo in 2/23 showed EF  60-65%, mild LVH, D-shaped septum, moderate RV dysfunction and moderate RV  enlargement, PASP 90 mmHg, mild-moderate TR. RHC 7/23 showed normal right and left filling pressures and severe pulmonary arterial hypertension with PVR 10 WU.  V/Q scan not suggestive of chronic PEs.  Severe emphysema by chest CT.  Suspect primarily group 3 PH (due to COPD and prior lung resection) but suspect also component of group 1 PH with serologic workup showing ANA + and anti-RNP +, concerning for MCTD/SLE. Referred to rheumatology, saw Dr. Dierdre Forth and says that she was not given a specific diagnosis. RHC in 9/23 showed moderate to severe PAH with PVR 8.6 and CI low but not markedly low at 2.1, RA and PCWP were not elevated. Given concern for possible group 1 component, tadalafil and Tyvaso were started.  She did not tolerate the 64 mcg dose of Tyvaso but tolerating 32 mcg dose.  - Would keep tadalafil at 40 mg daily.   - Continue Tyvaso 32 mcg   5. Positive bubble study: Suspect PFO.  Shunt run done with RHC in 9/23, no step up. This does not suggest the presence of a significant ASD.   F/u w/ Dr. Shirlee Latch in 6-8 wks   Robbie Lis, PA-C  11/10/2022

## 2022-11-10 ENCOUNTER — Encounter (HOSPITAL_COMMUNITY): Payer: Self-pay

## 2022-11-10 ENCOUNTER — Ambulatory Visit (HOSPITAL_COMMUNITY)
Admission: RE | Admit: 2022-11-10 | Discharge: 2022-11-10 | Disposition: A | Discharge status: Home / Self Care | Payer: Medicare Other | Source: Ambulatory Visit | Attending: Cardiology | Admitting: Cardiology

## 2022-11-10 VITALS — BP 94/52 | HR 99 | Wt 111.4 lb

## 2022-11-10 DIAGNOSIS — J439 Emphysema, unspecified: Secondary | ICD-10-CM | POA: Insufficient documentation

## 2022-11-10 DIAGNOSIS — I272 Pulmonary hypertension, unspecified: Secondary | ICD-10-CM | POA: Diagnosis not present

## 2022-11-10 DIAGNOSIS — Z87891 Personal history of nicotine dependence: Secondary | ICD-10-CM | POA: Insufficient documentation

## 2022-11-10 DIAGNOSIS — Z86711 Personal history of pulmonary embolism: Secondary | ICD-10-CM | POA: Diagnosis not present

## 2022-11-10 DIAGNOSIS — Z7901 Long term (current) use of anticoagulants: Secondary | ICD-10-CM | POA: Diagnosis not present

## 2022-11-10 DIAGNOSIS — I5032 Chronic diastolic (congestive) heart failure: Secondary | ICD-10-CM | POA: Diagnosis present

## 2022-11-10 DIAGNOSIS — Z79899 Other long term (current) drug therapy: Secondary | ICD-10-CM | POA: Diagnosis not present

## 2022-11-10 DIAGNOSIS — Z7984 Long term (current) use of oral hypoglycemic drugs: Secondary | ICD-10-CM | POA: Insufficient documentation

## 2022-11-10 DIAGNOSIS — E119 Type 2 diabetes mellitus without complications: Secondary | ICD-10-CM | POA: Insufficient documentation

## 2022-11-10 DIAGNOSIS — Z85118 Personal history of other malignant neoplasm of bronchus and lung: Secondary | ICD-10-CM | POA: Insufficient documentation

## 2022-11-10 DIAGNOSIS — I50812 Chronic right heart failure: Secondary | ICD-10-CM

## 2022-11-10 DIAGNOSIS — I2721 Secondary pulmonary arterial hypertension: Secondary | ICD-10-CM | POA: Insufficient documentation

## 2022-11-10 DIAGNOSIS — Z9981 Dependence on supplemental oxygen: Secondary | ICD-10-CM | POA: Diagnosis not present

## 2022-11-10 LAB — CBC
HCT: 33.6 % — ABNORMAL LOW (ref 36.0–46.0)
Hemoglobin: 11 g/dL — ABNORMAL LOW (ref 12.0–15.0)
MCH: 29.3 pg (ref 26.0–34.0)
MCHC: 32.7 g/dL (ref 30.0–36.0)
MCV: 89.4 fL (ref 80.0–100.0)
Platelets: 303 10*3/uL (ref 150–400)
RBC: 3.76 MIL/uL — ABNORMAL LOW (ref 3.87–5.11)
RDW: 15.8 % — ABNORMAL HIGH (ref 11.5–15.5)
WBC: 7.4 10*3/uL (ref 4.0–10.5)
nRBC: 0 % (ref 0.0–0.2)

## 2022-11-10 LAB — BASIC METABOLIC PANEL
Anion gap: 14 (ref 5–15)
BUN: 32 mg/dL — ABNORMAL HIGH (ref 8–23)
CO2: 25 mmol/L (ref 22–32)
Calcium: 9.6 mg/dL (ref 8.9–10.3)
Chloride: 97 mmol/L — ABNORMAL LOW (ref 98–111)
Creatinine, Ser: 1.4 mg/dL — ABNORMAL HIGH (ref 0.44–1.00)
GFR, Estimated: 38 mL/min — ABNORMAL LOW (ref >60)
Glucose, Bld: 120 mg/dL — ABNORMAL HIGH (ref 70–99)
Potassium: 3.7 mmol/L (ref 3.5–5.1)
Sodium: 136 mmol/L (ref 135–145)

## 2022-11-10 LAB — BRAIN NATRIURETIC PEPTIDE: B Natriuretic Peptide: 233.5 pg/mL — ABNORMAL HIGH (ref 0.0–100.0)

## 2022-11-10 NOTE — Patient Instructions (Signed)
Thank you for coming in today  Labs were done today, if any labs are abnormal the clinic will call you No news is good news  Medications: No changes  Follow up appointments:  Your physician recommends that you schedule a follow-up appointment in:  6-8 weeks with Dr. [name]    Do the following things EVERYDAY: Weigh yourself in the morning before breakfast. Write it down and keep it in a log. Take your medicines as prescribed Eat low salt foods--Limit salt (sodium) to 2000 mg per day.  Stay as active as you can everyday Limit all fluids for the day to less than 2 liters   At the [facility], you and your health needs are our priority. As part of our continuing mission to provide you with exceptional heart care, we have created designated Provider Care Teams. These Care Teams include your primary Cardiologist (physician) and Advanced Practice Providers (APPs- Physician Assistants and Nurse Practitioners) who all work together to provide you with the care you need, when you need it.   You may see any of the following providers on your designated Care Team at your next follow up: Dr Glori Bickers Dr Loralie Champagne Dr. Roxana Hires, NP Lyda Jester, [facility] [place], [place] Forestine Na, NP Audry Riles, PharmD   Please be sure to bring in all your medications bottles to every appointment.    Thank you for choosing [facility]  If you have any questions or concerns before your next appointment please send [place] a message through [place] or call our office at [phone].    TO LEAVE A MESSAGE FOR THE NURSE SELECT OPTION 2, PLEASE LEAVE A MESSAGE INCLUDING: YOUR NAME DATE OF BIRTH CALL BACK NUMBER REASON FOR CALL**this is important as we prioritize the call backs  YOU WILL RECEIVE A CALL BACK THE SAME DAY AS LONG AS YOU CALL BEFORE 4:00 PM

## 2022-11-11 ENCOUNTER — Other Ambulatory Visit: Payer: Self-pay | Admitting: Obstetrics and Gynecology

## 2022-11-11 ENCOUNTER — Ambulatory Visit: Payer: Medicare Other | Admitting: Nurse Practitioner

## 2022-11-11 ENCOUNTER — Telehealth (HOSPITAL_COMMUNITY): Payer: Self-pay

## 2022-11-11 ENCOUNTER — Ambulatory Visit: Payer: Medicare Other

## 2022-11-11 DIAGNOSIS — N6310 Unspecified lump in the right breast, unspecified quadrant: Secondary | ICD-10-CM

## 2022-11-11 NOTE — Telephone Encounter (Signed)
Patient's daughter informed of labs. She will call us on Monday to set up lab appointment.

## 2022-11-16 ENCOUNTER — Other Ambulatory Visit: Payer: Self-pay | Admitting: Nurse Practitioner

## 2022-11-16 DIAGNOSIS — E119 Type 2 diabetes mellitus without complications: Secondary | ICD-10-CM

## 2022-11-19 ENCOUNTER — Encounter (HOSPITAL_COMMUNITY): Payer: Medicare Other

## 2022-11-22 ENCOUNTER — Encounter: Payer: Self-pay | Admitting: Nurse Practitioner

## 2022-11-23 NOTE — Telephone Encounter (Signed)
Patient has upcoming appt on 12/02/22

## 2022-11-30 ENCOUNTER — Encounter: Payer: Self-pay | Admitting: Nurse Practitioner

## 2022-12-01 DIAGNOSIS — M5417 Radiculopathy, lumbosacral region: Secondary | ICD-10-CM | POA: Insufficient documentation

## 2022-12-01 DIAGNOSIS — K5790 Diverticulosis of intestine, part unspecified, without perforation or abscess without bleeding: Secondary | ICD-10-CM | POA: Insufficient documentation

## 2022-12-01 DIAGNOSIS — K635 Polyp of colon: Secondary | ICD-10-CM | POA: Insufficient documentation

## 2022-12-01 DIAGNOSIS — G5600 Carpal tunnel syndrome, unspecified upper limb: Secondary | ICD-10-CM | POA: Insufficient documentation

## 2022-12-01 DIAGNOSIS — D259 Leiomyoma of uterus, unspecified: Secondary | ICD-10-CM | POA: Insufficient documentation

## 2022-12-01 DIAGNOSIS — G43909 Migraine, unspecified, not intractable, without status migrainosus: Secondary | ICD-10-CM | POA: Insufficient documentation

## 2022-12-01 DIAGNOSIS — K649 Unspecified hemorrhoids: Secondary | ICD-10-CM | POA: Insufficient documentation

## 2022-12-02 ENCOUNTER — Ambulatory Visit: Payer: Medicare Other | Admitting: Nurse Practitioner

## 2022-12-02 ENCOUNTER — Ambulatory Visit: Payer: Medicare Other

## 2022-12-06 ENCOUNTER — Ambulatory Visit: Payer: Medicare Other | Admitting: Pulmonary Disease

## 2022-12-08 ENCOUNTER — Ambulatory Visit (INDEPENDENT_AMBULATORY_CARE_PROVIDER_SITE_OTHER): Payer: Medicare Other | Admitting: Pulmonary Disease

## 2022-12-08 ENCOUNTER — Encounter: Payer: Self-pay | Admitting: Pulmonary Disease

## 2022-12-08 VITALS — BP 100/58 | HR 85 | Temp 97.1°F | Ht 62.0 in | Wt 109.2 lb

## 2022-12-08 DIAGNOSIS — C3490 Malignant neoplasm of unspecified part of unspecified bronchus or lung: Secondary | ICD-10-CM

## 2022-12-08 DIAGNOSIS — J4489 Other specified chronic obstructive pulmonary disease: Secondary | ICD-10-CM | POA: Diagnosis not present

## 2022-12-08 DIAGNOSIS — J9611 Chronic respiratory failure with hypoxia: Secondary | ICD-10-CM

## 2022-12-08 DIAGNOSIS — J439 Emphysema, unspecified: Secondary | ICD-10-CM

## 2022-12-08 DIAGNOSIS — I272 Pulmonary hypertension, unspecified: Secondary | ICD-10-CM | POA: Diagnosis not present

## 2022-12-08 NOTE — Progress Notes (Signed)
Subjective:    Patient ID: Brianna Adams, female    DOB: 1940/12/04, 82 y.o.   MRN: [id number] Patient Care Team: [name], [place] as PCP - General (General Practice) Earlie Server, MD as Consulting Physician (Oncology)  Chief Complaint  Patient presents with   Follow-up    SOB with exertion. No Cough. Wheezing.   HPI Brianna Adams is an 82 year old former smoker (46 PY) followed for the issue of COPD with bronchitis/emphysema and chronic respiratory failure.  She has a prior history of right lower lobe resection for lung cancer and empiric SBRT to a nodule in 2019.  She has a prior history of PE on Eliquis, chronic diastolic heart failure and multiple other issues as noted below.  She has severe pulmonary hypertension and is followed by the advanced heart failure clinic.  She has had negative connective tissue disease workup.  She is currently on Tyvaso and tadalafil for pulmonary hypertension.  She was last seen by Roxan Diesel, NP on 22 July 2022.  This is a scheduled visit for follow-up today.  At her prior visit on 16 November she was fairly well compensated and her oxygen requirements were down to 4 L/min.  This is her baseline.  At her November visit to it was noted that she had just started Tyvaso.  At visit they had tried to increase Tyvaso dose from 32 to 64 mcg per dose however, she did not tolerate this increase and is back to 32 mcg per dose.  She is using Breztri however does admit that sometimes she forgets to take the second dose of the day.  She does note relief of shortness of breath when she takes it regularly.  Daughter accompanies her today.  We discussed Brianna Adams's weight loss.  I suspect that this is a combination of pulmonary and cardiac cachexia.  We discussed having smaller more frequent meals and to refer initially eat higher protein foods and vegetables and avoid simple carbohydrates.  Patient's daughter would like to have an evaluation of Brianna Adams by Palliative Care.  We  discussed that this would be reasonable.  Overall the patient feels that she is stable.  She would like to procure primary care in this area as she is currently seeing primary care in [place].   Review of Systems A 10 point review of systems was performed and it is as noted above otherwise negative.  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   Hemorrhoids 12/01/2022   Hyperplastic polyp of intestine 12/01/2022   Lumbosacral radiculopathy 12/01/2022   Migraine 12/01/2022   Uterine leiomyoma 12/01/2022   Acute on chronic respiratory failure with hypoxia 06/02/2022   Acute renal failure superimposed on stage 3a chronic kidney disease 06/02/2022   Rectal bleeding    Mild non proliferative diabetic retinopathy 08/05/2021   Encounter for preoperative pulmonary examination 07/21/2021   IDA (iron deficiency anemia) 06/24/2021   Squamous cell carcinoma lung, unspecified laterality 06/04/2021   Pneumonia of right upper lobe due to infectious organism 03/09/2021   CKD (chronic kidney disease), stage II 03/09/2021   Chronic diastolic CHF (congestive heart failure) 03/09/2021   HTN (hypertension) 03/09/2021   HLD (hyperlipidemia) 03/09/2021   COPD with acute exacerbation 03/09/2021   Elevated troponin 03/09/2021   Type 2 diabetes mellitus with hyperlipidemia 03/09/2021   Radiation fibrosis of lung 12/31/2020   Acute on chronic heart failure with preserved ejection fraction (HFpEF) 12/09/2020  Lymphadenopathy    Pulmonary embolism 02/29/2020   Chronic respiratory failure with hypoxia 02/18/2020   Pulmonary nodule 02/18/2020   GERD (gastroesophageal reflux disease) 02/18/2020   Pulmonary hypertension, unspecified 10/04/2019   COPD with chronic bronchitis and emphysema 03/14/2019   Type 2 diabetes mellitus with stage 3a chronic kidney disease 03/14/2019   Mass of thyroid gland 12/07/2016   Adrenal adenoma  11/18/2016   Non-toxic multinodular goiter 11/18/2016   Mass of left adrenal gland 11/02/2016   Cataract 10/21/2016   Paresthesia of lower extremity 08/05/2016   Trochanteric bursitis of right hip 06/06/2016   Osteopenia 05/24/2016   Malignant neoplasm of body of uterus 08/06/2014   Ventricular bigeminy 02/05/2011   Seizure 09/06/1949   Social History   Tobacco Use   Smoking status: Former    Packs/day: 1.00    Years: 58.00    Additional pack years: 0.00    Total pack years: 58.00    Types: Cigarettes    Quit date: 10/22/2015    Years since quitting: 7.1    Passive exposure: Past   Smokeless tobacco: Never   Tobacco comments:    congratulated  Substance Use Topics   Alcohol use: Never   Allergies  Allergen Reactions   Ace Inhibitors Swelling   Penicillin G Hives, Shortness Of Breath and Rash   Zestril [Lisinopril] Swelling   Cortisone Other (See Comments)    Unknown reaction   Hydrocortisone Other (See Comments)    Unknown reaction   Lipitor [Atorvastatin] Other (See Comments)    Myalgias (Muscle Pain) in high dosages     Mobic [Meloxicam] Other (See Comments)    Bloody stools   Pravachol [Pravastatin] Other (See Comments)    Chest Pain if high dosage    Current Meds  Medication Sig   acetaminophen (TYLENOL) 500 MG tablet Take 1,000 mg by mouth every 6 (six) hours as needed for mild pain or fever.   albuterol (VENTOLIN HFA) 108 (90 Base) MCG/ACT inhaler INHALE 2 PUFFS BY MOUTH EVERY 6 HOURS AS NEEDED FOR WHEEZE OR SHORTNESS OF BREATH   apixaban (ELIQUIS) 2.5 MG TABS tablet Take 1 tablet (2.5 mg total) by mouth 2 (two) times daily.   beta carotene w/minerals (OCUVITE) tablet Take 1 tablet by mouth in the morning.   Budeson-Glycopyrrol-Formoterol (BREZTRI AEROSPHERE) 160-9-4.8 MCG/ACT AERO Inhale 2 puffs into the lungs in the morning and at bedtime. (Patient taking differently: Inhale 2 puffs into the lungs once.)   CALCIUM PO Take 1,200 mg by mouth in the morning.    Cinnamon 500 MG TABS Take 1,000 mg by mouth at bedtime.   ferrous sulfate 325 (65 FE) MG EC tablet Take 1 tablet (325 mg total) by mouth 2 (two) times daily with a meal.   JARDIANCE 10 MG TABS tablet TAKE 1 TABLET BY MOUTH EVERY DAY BEFORE BREAKFAST   KLOR-CON M20 20 MEQ tablet TAKE 1 TABLET BY MOUTH EVERY DAY   metFORMIN (GLUCOPHAGE) 1000 MG tablet TAKE 1 TABLET (1,000 MG TOTAL) BY MOUTH TWICE A DAY WITH FOOD   Multiple Vitamins-Minerals (CENTRUM SILVER 50+WOMEN PO) Take 1 tablet by mouth in the morning.   omeprazole (PRILOSEC) 40 MG capsule TAKE 1 CAPSULE BY MOUTH EVERY DAY   pravastatin (PRAVACHOL) 10 MG tablet TAKE 1 TABLET BY MOUTH EVERY DAY   Pseudoephedrine-guaiFENesin (MUCINEX D PO) Take 5 mLs by mouth at bedtime.   Spacer/Aero-Holding Dorise Bullion Use with inhaler   tadalafil, PAH, (ADCIRCA) 20 MG tablet Take 2 tablets (40 mg  total) by mouth daily.   torsemide (DEMADEX) 20 MG tablet Take 1.5 tablets (30 mg total) by mouth daily.   TYVASO DPI MAINTENANCE KIT 32 MCG POWD Inhale into the lungs. Inhale 32 mcg into the lungs in the morning, at noon, in the evening, and at bedtime.   vitamin C (ASCORBIC ACID) 500 MG tablet Take 500 mg by mouth every Tuesday, Thursday, Saturday, and Sunday. In the morning.   vitamin E 180 MG (400 UNITS) capsule Take 400 Units by mouth every Monday, Wednesday, and Friday. In the morning.   Immunization History  Administered Date(s) Administered   Fluad Quad(high Dose 65+) 05/24/2019, 05/13/2020, 06/18/2022   Influenza, High Dose Seasonal PF 06/01/2021   Influenza, Quadrivalent, Recombinant, Inj, Pf 07/15/2017   Influenza,inj,quad, With Preservative 05/29/2015, 06/06/2016, 06/06/2018   Influenza-Unspecified 06/06/2018, 06/18/2022   Moderna Covid-19 Vaccine Bivalent Booster 79yrs & up 04/24/2022   PFIZER(Purple Top)SARS-COV-2 Vaccination 10/20/2019, 11/19/2019, 06/11/2020   Pfizer Covid-19 Vaccine Bivalent Booster 22yrs & up 06/01/2021   Pneumococcal  Conjugate-13 10/31/2013   Pneumococcal Polysaccharide-23 12/05/1997, 11/05/2007, 04/07/2015   Respiratory Syncytial Virus Vaccine,Recomb Aduvanted(Arexvy) 09/20/2022   Rsv, Bivalent, Protein Subunit Rsvpref,pf (Abrysvo) 09/20/2022   Td 09/07/2003   Td (Adult),unspecified 09/07/2003   Tdap 08/07/2015, 08/17/2017   Zoster Recombinat (Shingrix) 08/17/2017, 06/28/2019, 09/05/2019   Zoster, Live 11/05/2010       Objective:   Physical Exam BP (!) 100/58 (BP Location: Left Arm, Cuff Size: Normal)   Pulse 85   Temp (!) 97.1 F (36.2 C)   Ht 5\' 2"  (1.575 m)   Wt 108 lb (49 kg) Comment: per patient. in a wheelchair today  SpO2 98%   BMI 19.75 kg/m   SpO2: 98 % O2 Device: Nasal cannula O2 Flow Rate (L/min): 4 L/min O2 Type: Continuous O2  GENERAL: Well-developed well-nourished elderly woman no acute distress, mild tachypnea, she presents in transport chair. HEAD: Normocephalic, atraumatic.  EYES: Pupils equal, round, reactive to light.  No scleral icterus.  MOUTH: Oral mucosa moist.  No thrush NECK: Supple. No thyromegaly. Trachea midline.  Significant JVD.  No adenopathy. PULMONARY: Distant breath sounds, coarse, no adventitious sounds. CARDIOVASCULAR: S1 and S2. Regular rate and rhythm.  Previously noted murmur not appreciated today.  GASTROINTESTINAL: Benign. MUSCULOSKELETAL: No joint deformity, no clubbing, no edema LE's.  Significant sarcopenia. NEUROLOGIC: No focal deficit.  Speech is fluent.  SKIN: Intact,warm,dry.  Limited exam no rashes PSYCH: Mood and behavior appropriate.      Assessment & Plan:      ICD-10-CM   1. COPD with chronic bronchitis and emphysema  J44.89 Amb Referral to Palliative Care   J43.9    Continue Breztri 2 puffs twice a day Continue as needed albuterol    2. Chronic respiratory failure with hypoxia  J96.11 Amb Referral to Palliative Care   Continue oxygen at 4 L/min Patient is compliant and notes benefit from therapy    3. Severe pulmonary  hypertension  I27.20 Amb Referral to Palliative Care   Continue oxygen supplementation Continue Tyvaso and Adcirca per advanced heart failure clinic Continue diuretics as tolerated    4. Squamous cell carcinoma lung, unspecified laterality  C34.90 CT CHEST WO CONTRAST    Amb Referral to Palliative Care   Follow-up with CT chest May 2024     Orders Placed This Encounter  Procedures   CT CHEST WO CONTRAST    To be done in May    Standing Status:   Future    Standing Expiration Date:  12/08/2023    Order Specific Question:   Preferred imaging location?    Answer:   [facility]   Amb Referral to Palliative Care    Referral Priority:   Routine    Referral Type:   Consultation    Number of Visits Requested:   1   Will place referral to Palliative Care as requested by patient's daughter.  Schedule CT chest May 2024.  Continue therapies as above.  Renold Don, MD Advanced Bronchoscopy PCCM [facility]    *This note was dictated using voice recognition software/Dragon.  Despite best efforts to proofread, errors can occur which can change the meaning. Any transcriptional errors that result from this process are unintentional and may not be fully corrected at the time of dictation.

## 2022-12-08 NOTE — Patient Instructions (Signed)
Continue oxygen at 4 L/min as you are doing.  Continue your medications.  We will schedule a CT scan of the chest for May.  We will see you in follow-up towards the end of May.  We have placed a consult to Palliative Care.  Call sooner should you have any new issues with shortness of breath or any new concerns.

## 2022-12-09 ENCOUNTER — Other Ambulatory Visit: Payer: Medicare Other

## 2022-12-09 DIAGNOSIS — Z515 Encounter for palliative care: Secondary | ICD-10-CM

## 2022-12-09 NOTE — Progress Notes (Signed)
TELEPHONE ENCOUNTER  Palliative care sw connected with patients daughter Brianna Adams, in reference to new PC referral place by patients PCP. SW discussed new PC criteria.  Patients daughter shared that patient has become more withdrawn and less active. Patient goes [facility] every Tue and Thu usually however patient has not had the urge to go lately and daughter does not force her to do os. Daughter shared that she was interested in services that could assist patient with bathing and ADL's a couple of days a week. SW advised daughter that PC does not offer these types of services, due to [facility] being more of a consultative service where a Therapist, sports or social worker does consultative visits once or twice a month. However, SW could provide community resources to address the need. Daughter stated understanding and is still open to an in person PC assessment to determine if patient meets new PC criteria of having a life expectancy of 12 months or less due to a critical illness.   Plan: PC SW to see patient Thu 4/11 @11am .

## 2022-12-16 ENCOUNTER — Other Ambulatory Visit: Payer: Medicare Other

## 2022-12-16 DIAGNOSIS — Z515 Encounter for palliative care: Secondary | ICD-10-CM

## 2022-12-16 NOTE — Progress Notes (Signed)
COMMUNITY PALLIATIVE CARE SW NOTE  PATIENT NAME: Brianna Adams DOB: [birth year] MRN: [id number]  PRIMARY CARE PROVIDER: Arnette Felts, [name]  RESPONSIBLE PARTY:  Acct ID - Guarantor Home Phone Work Phone Relationship Acct Type  [id number] Trudie Reed* [phone]  Self P/F     [address], [place], [place] [address]     PLAN OF CARE and INTERVENTIONS:                GOALS OF CARE/ ADVANCE CARE PLANNING:    Goals include to maximize quality of life and assist with pain management. Our advance care planning conversation included a discussion about:    The value and importance of advance care planning  Review and updating or creation of an advance directive document.                          Code Status: FULL CODE. Will address at next visit.                        ACD: will address at next visit                        GOC: ongoing discussion.  2.        SOCIAL/EMOTIONAL/SPIRITUAL ASSESSMENT/ INTERVENTIONS:         Palliative care encounter: SW completed initial in home visit with patient and daughter. PC criteria reviewed and discussed.   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   COPD: patient on 4LPM continuously. Patient express SOB during exertion. No swelling in bilateral legs. Patient scheduled to have CT of chest done in may.   Mobility: patient is ambulatory w/o AD. Patient is able to complete ADL's, bathing and dressing (I) but takes breaks. Patient is able to cook small meals such as oatmeal and warm up pre-made meals. [facility] During visit patient went to make her some coffee.  Sleeping: patient is sleeping more, states she is just tired.  Primary care: daughter inquired about a new provider that specializes in geriatrics in the [place] area. SW provided daughter with Dr. Trenton Gammon in [place].   Appetite: Patient is eating fine. Per daughter patient has lost a  significant amount of weight since 2018. Patient most recent weight is 109. Patient advised to weigh herself more often to monitor weights   Psychosocial assessment: completed.   In home support: Patients daughter is primary caregiver. SW provided daughter with caregiver agencies: [facility] and [employer]. SW also provided daughter with contact with private caregiver. SW also advised for patient/daughter to outreach patients insurance about custodial care benefits.  Transportation: no needs.  Food: no food insecurities witnessed.   Safety and long term planning: patient feels safe in her home and desires to remain in her home with daughter. Discussed life alert.    SW discussed goals, reviewed care plan, provided emotional support, used active and reflective listening in the form of reciprocity emotional response. Questions and concerns were addressed. The patient/family was encouraged to call with any additional questions and/or concerns. PC Provided general support and encouragement, no other unmet needs identified. Will continue to follow.   3.         PATIENT/CAREGIVER EDUCATION/ COPING:   Appearance: well groomed, appropriate given situation  Mental Status: Alert and oriented. Eye Contact: good  Thought Process: rational  Thought Content: not assessed  Speech: good/clear  Mood: Normal and calm Affect: Congruent to endorsed mood, full ranging Insight: good Judgement: good Interaction Style: Cooperative   Patient A&O and able to make needs known. Patient able to engage in conversation and answer all questions appropriately. PHQ9: 5 indicating mild depression. Patient share that although she does not feel sad and denies S/S of anxiety. However states she used to enjoy dancing, shopping and driving but now she does not have the energy to do any of these activities.   4.         PERSONAL EMERGENCY PLAN:  Patient/daughter will call 9-1-1 for emergencies.    5.         COMMUNITY  RESOURCES COORDINATION/ HEALTH CARE NAVIGATION:  patients daughter manages her care.    6.      FINANCIAL CONCERNS/NEEDS: none                         Primary Health Insurance: Medicare Secondary Health Insurance: Cigna Prescription Coverage: Yes, no history of difficulty obtaining or affording prescriptions reported.     SOCIAL HX:  Social History   Tobacco Use   Smoking status: Former    Packs/day: 1.00    Years: 58.00    Additional pack years: 0.00    Total pack years: 58.00    Types: Cigarettes    Quit date: 10/22/2015    Years since quitting: 7.1    Passive exposure: Past   Smokeless tobacco: Never   Tobacco comments:    congratulated  Substance Use Topics   Alcohol use: Never    CODE STATUS: FULL CODE ADVANCED DIRECTIVES: N MOST FORM COMPLETE:  N HOSPICE EDUCATION PROVIDED: N  PPS: 50%  Time spent: 50 min      [place], [place]

## 2022-12-17 ENCOUNTER — Telehealth: Payer: Self-pay | Admitting: Nurse Practitioner

## 2022-12-17 NOTE — Telephone Encounter (Signed)
Contacted Brianna Adams to schedule their annual wellness visit. Call back at later date: 01/05/23  [name] AWV direct phone # [phone]   Spoke with patients daughter to schedule AWV.  She stated she was getting ready to go to work and will call back to schedule   Please schedule AWV with [facility]

## 2022-12-24 ENCOUNTER — Encounter: Payer: Self-pay | Admitting: Pulmonary Disease

## 2022-12-24 ENCOUNTER — Encounter: Payer: Self-pay | Admitting: Oncology

## 2022-12-24 ENCOUNTER — Encounter (HOSPITAL_BASED_OUTPATIENT_CLINIC_OR_DEPARTMENT_OTHER): Payer: Self-pay | Admitting: Cardiology

## 2022-12-24 ENCOUNTER — Other Ambulatory Visit: Payer: Self-pay

## 2022-12-24 ENCOUNTER — Telehealth: Payer: Self-pay

## 2022-12-24 ENCOUNTER — Encounter (HOSPITAL_COMMUNITY): Payer: Self-pay | Admitting: Cardiology

## 2022-12-24 ENCOUNTER — Encounter: Payer: Self-pay | Admitting: Nurse Practitioner

## 2022-12-24 DIAGNOSIS — R051 Acute cough: Secondary | ICD-10-CM

## 2022-12-24 DIAGNOSIS — J449 Chronic obstructive pulmonary disease, unspecified: Secondary | ICD-10-CM

## 2022-12-24 MED ORDER — BREZTRI AEROSPHERE 160-9-4.8 MCG/ACT IN AERO: 2.0000 | INHALATION_SPRAY | Freq: Two times a day (BID) | RESPIRATORY_TRACT | 12 refills | Status: DC

## 2022-12-24 MED ORDER — ALBUTEROL SULFATE HFA 108 (90 BASE) MCG/ACT IN AERS: INHALATION_SPRAY | RESPIRATORY_TRACT | 5 refills | Status: DC

## 2022-12-24 NOTE — Telephone Encounter (Signed)
Pharmacy changed to [facility] per request by daughter

## 2022-12-31 ENCOUNTER — Other Ambulatory Visit: Payer: Self-pay | Admitting: Obstetrics and Gynecology

## 2022-12-31 DIAGNOSIS — Z13820 Encounter for screening for osteoporosis: Secondary | ICD-10-CM

## 2023-01-03 ENCOUNTER — Other Ambulatory Visit: Payer: Medicare Other

## 2023-01-03 VITALS — BP 100/52 | Temp 97.4°F | Wt 108.0 lb

## 2023-01-03 DIAGNOSIS — Z515 Encounter for palliative care: Secondary | ICD-10-CM

## 2023-01-03 NOTE — Progress Notes (Signed)
PATIENT NAME: Brianna Adams DOB: 12-02-1940 MRN: [id number]  PRIMARY CARE PROVIDER: Arnette Felts, [name]  RESPONSIBLE PARTY:  Acct ID - Guarantor Home Phone Work Phone Relationship Acct Type  [id number] HALEEMAH, BUCKALEW* [phone]  Self P/F     [address], [place], [place] [address]   Home visit completed with patient, daughter [name] and [name], [place].   ACP:  Remains a full code.  I have provided "Hard Choices" booklet for patient and daughter to review before they meet with the attorney regarding Advance Directives.  Will follow back up with patient on next visit regarding code status.   Appetite:  Patient endorses eating well but less since she is older.  Discussion was had regarding eating smaller meals more frequently during the day and a supplement drink with her pulmonologist.  Daughter advised they have not started this yet.  Weights have been trending downward see below.  Weights: 12/08/22-109 lbs 11/10/22-111 lbs 10/20/22- 112 lbs 10/15/22-115 lbs 07/22/22-121 lbs 07/06/22- 121 lbs  COPD:  Continues on oxygen at 4 L via [place].  Using inhalers as directed.  Reporting shortness of breath with exertion-see below.   Heart Failure:  Denies chest pain, palpitations, dizziness or headaches.  Endorses fatigue and shortness of breath with exertion. Only weighing herself at doctor appointments and endorses weight loss.   Patient feels her fatigue has been getting worse, takes her longer to complete task as she must rest between them.  We discussed disease progression.  Resource booklet for Advance Cardiac Disease provided to patient and daughter.  Patient to follow up with cardiology this week.   Medication Management:  Now having medications bubble packed and delivered through [facility].  Patient has been compliant with medications.     CODE STATUS: Full ADVANCED DIRECTIVES: In process-will meet with attorney in the next week.  MOST FORM: No PPS: 50%   PHYSICAL EXAM:    VITALS: Today's Vitals   01/03/23 1226  BP: (!) 100/52  Temp: (!) 97.4 F (36.3 C)  Weight: 108 lb (49 kg)    LUNGS: clear to auscultation  CARDIAC: Cor Tachy}  EXTREMITIES: - for edema SKIN: Skin color, texture, turgor normal. No rashes or lesions or mobility and turgor normal  NEURO: positive for weakness       Truitt Merle, RN

## 2023-01-03 NOTE — Progress Notes (Signed)
COMMUNITY PALLIATIVE CARE SW NOTE  PATIENT NAME: Brianna Adams DOB: 1941-07-03 MRN: [id number]  PRIMARY CARE PROVIDER: Arnette Felts, [name]  RESPONSIBLE PARTY:  Acct ID - Guarantor Home Phone Work Phone Relationship Acct Type  [id number] Trudie Reed* [phone]  Self P/F     [address], [place], [place] [address]     PLAN OF CARE and INTERVENTIONS:              Palliative care encounter: follow up visit completed with [facility] RN Bradd Canary, patient and patients daughter [name].   CHF and COPD: education provided. CHF book left. Patient is experiencing disease progression with both dx. Patient share that she is feeling more fatigue and having to take more breaks while doing normal daily activities. Patient continues on 4LPM.  Appetite: patient eats well, but is still experiencing unintentional weight loss. Patient is currently 108lbs as of 4/24.  ACD: patient and daughter to see lawyer this week to complete documents. Code status discussed, patiently currently full code. Hard choices book left with patient and daughter.    Palliative care will continue to follow.  SOCIAL HX:  Social History   Tobacco Use   Smoking status: Former    Packs/day: 1.00    Years: 58.00    Additional pack years: 0.00    Total pack years: 58.00    Types: Cigarettes    Quit date: 10/22/2015    Years since quitting: 7.2    Passive exposure: Past   Smokeless tobacco: Never   Tobacco comments:    congratulated  Substance Use Topics   Alcohol use: Never    CODE STATUS: FULL CODE  ADVANCED DIRECTIVES: Y MOST FORM COMPLETE:  discussed. Patient to complete with lawyer HOSPICE EDUCATION PROVIDED: N  PPS: 40%      [place], [place]

## 2023-01-04 ENCOUNTER — Other Ambulatory Visit (HOSPITAL_COMMUNITY): Payer: Self-pay | Admitting: Pharmacist

## 2023-01-04 ENCOUNTER — Ambulatory Visit (HOSPITAL_COMMUNITY)
Admission: RE | Admit: 2023-01-04 | Discharge: 2023-01-04 | Disposition: A | Discharge status: Home / Self Care | Payer: Medicare Other | Source: Ambulatory Visit | Attending: Cardiology | Admitting: Cardiology

## 2023-01-04 ENCOUNTER — Encounter (HOSPITAL_COMMUNITY): Payer: Self-pay | Admitting: Cardiology

## 2023-01-04 VITALS — BP 92/50 | HR 87 | Wt 109.4 lb

## 2023-01-04 DIAGNOSIS — Z9981 Dependence on supplemental oxygen: Secondary | ICD-10-CM | POA: Diagnosis not present

## 2023-01-04 DIAGNOSIS — R54 Age-related physical debility: Secondary | ICD-10-CM | POA: Diagnosis not present

## 2023-01-04 DIAGNOSIS — Z87891 Personal history of nicotine dependence: Secondary | ICD-10-CM | POA: Diagnosis not present

## 2023-01-04 DIAGNOSIS — Z85118 Personal history of other malignant neoplasm of bronchus and lung: Secondary | ICD-10-CM | POA: Diagnosis not present

## 2023-01-04 DIAGNOSIS — I5032 Chronic diastolic (congestive) heart failure: Secondary | ICD-10-CM | POA: Diagnosis not present

## 2023-01-04 DIAGNOSIS — J439 Emphysema, unspecified: Secondary | ICD-10-CM | POA: Insufficient documentation

## 2023-01-04 DIAGNOSIS — Z7984 Long term (current) use of oral hypoglycemic drugs: Secondary | ICD-10-CM | POA: Insufficient documentation

## 2023-01-04 DIAGNOSIS — R0602 Shortness of breath: Secondary | ICD-10-CM | POA: Diagnosis not present

## 2023-01-04 DIAGNOSIS — E119 Type 2 diabetes mellitus without complications: Secondary | ICD-10-CM | POA: Insufficient documentation

## 2023-01-04 DIAGNOSIS — Z79899 Other long term (current) drug therapy: Secondary | ICD-10-CM | POA: Diagnosis not present

## 2023-01-04 DIAGNOSIS — I272 Pulmonary hypertension, unspecified: Secondary | ICD-10-CM

## 2023-01-04 DIAGNOSIS — Z86711 Personal history of pulmonary embolism: Secondary | ICD-10-CM | POA: Insufficient documentation

## 2023-01-04 DIAGNOSIS — I2721 Secondary pulmonary arterial hypertension: Secondary | ICD-10-CM | POA: Insufficient documentation

## 2023-01-04 DIAGNOSIS — Z7901 Long term (current) use of anticoagulants: Secondary | ICD-10-CM | POA: Diagnosis not present

## 2023-01-04 LAB — BASIC METABOLIC PANEL
Anion gap: 13 (ref 5–15)
BUN: 39 mg/dL — ABNORMAL HIGH (ref 8–23)
CO2: 23 mmol/L (ref 22–32)
Calcium: 9.8 mg/dL (ref 8.9–10.3)
Chloride: 96 mmol/L — ABNORMAL LOW (ref 98–111)
Creatinine, Ser: 1.53 mg/dL — ABNORMAL HIGH (ref 0.44–1.00)
GFR, Estimated: 34 mL/min — ABNORMAL LOW (ref >60)
Glucose, Bld: 130 mg/dL — ABNORMAL HIGH (ref 70–99)
Potassium: 4.2 mmol/L (ref 3.5–5.1)
Sodium: 132 mmol/L — ABNORMAL LOW (ref 135–145)

## 2023-01-04 LAB — BRAIN NATRIURETIC PEPTIDE: B Natriuretic Peptide: 419.5 pg/mL — ABNORMAL HIGH (ref 0.0–100.0)

## 2023-01-04 MED ORDER — TYVASO DPI MAINTENANCE KIT 16 MCG IN POWD: 16.0000 ug | Freq: Four times a day (QID) | RESPIRATORY_TRACT | 11 refills | Status: DC

## 2023-01-04 NOTE — Progress Notes (Signed)
PCP: Arnette Felts, [name] Cardiology: Dr. Bjorn Pippin HF Cardiology: Dr. Shirlee Latch  82 y.o. with history of COPD/emphysema, prior PE in 6/21, lung cancer, and pulmonary hypertension was referred by Dr. Bjorn Pippin for evaluation of pulmonary hypertension. Patient had right lower lobe lung resection for cancer in 2018 then radiation on the left side in 2019.  CT chest in 5/23 showed advanced emphysema with radiation scarring left upper lobe. She is on home oxygen 4L [place].  Patient was admitted in 4/22 with CHF, echo at that time showed EF 55-60%, moderate RV enlargement with PASP 85 mmHg.  Echo repeated in 2/23 showed EF 60-65%, mild LVH, D-shaped septum, moderate RV dysfunction and moderate RV enlargement, PASP 90 mmHg, mild-moderate TR.  Subsequently RHC was done in 2/23 showing normal RA pressure but significantly elevated PCWP and severe pulmonary hypertension.  This appeared to be mixed pulmonary venous/pulmonary arterial hypertension.   RHC 7/23 showed normal filling pressures and severe pulmonary arterial hypertension. V/Q scan was negative for chronic PEs.   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  Echo bubble study was positive.  Repeat RHC showed moderate-severe PAH with no step up in oxygen saturation to suggest significant left=>right shunting.    She was seen by rheumatology (I do not have a copy of the notes); daughter says she was not thought to have a rheumatologic diagnosis.   We tried her on Tyvaso, but she felt worse when this was titrated up to 64 mcg.  Now down to 32 mcg.   She returns for followup of RV failure/pulmonary hypertension with her daughter.  She remains on 4L [place].  Palliative care is following.  She is short of breath walking through her house though she continues to go to [facility] with her daughter. No lightheadedness or syncope, no chest pain.  In general, very limited and needs help with ADLs.  She does not think that Tyvaso has helped.  Weight down 2 lbs.   Labs (5/23): K  5.1, creatinine 0.93 Labs (8/23): K 4.0, creatinine 1.02 Labs (10/23): K 3.9, creatinine 1.09 Labs (1/24): BNP 396, K 4.6, creatinine 1.46 Labs (2/24): K 4.0, creatinine 1.32 Labs (3/24): BNP 233, K 3.7, creatinine 1.4  ECG (personally reviewed): NSR, RVH  PMH: 1. COPD: Emphysema.  Prior smoker.  - on home oxygen - CT chest (5/23): Advanced emphysema, radiation scarring left upper lobe.  2. H/o PE in 6/21 3. Type 2 diabetes  4. Lung cancer s/p RLL resection in 2018 and radiation in 2019.  5. Pulmonary hypertension: Echo (4/22) with EF 55-60%, moderate RV enlargement with PASP 85 mmHg.  - Echo (2/23) with EF 60-65%, mild LVH, D-shaped septum, moderate RV dysfunction and moderate RV enlargement, PASP 90 mmHg, mild-moderate TR.  - RHC (2/23): mean RA 2, PA 75/21 mean 42, mean PCWP 24, CI 3.37, PVR 3.3 WU - RHC (7/23): mean RA 6, PA 77/26, mean 44, mean PCWP 7, CI 2.37, PVR 10 WU - V/Q scan (7/23): No chronic PE - ANA+, anti-RNP+ - RHC (9/23): mean RA 4, PA 68/23 mean 40, mean PCWP 11, CI 2.12, PVR 8.6 WU.  No step-up in oxygen saturation on shunt run.  - Echo (9/23): EF 55-60%, positive bubble study, D-shaped septum with moderate RV enlargement and moderately decreased RV systolic function, PASP 64 mmHg.   ROS: All systems reviewed and negative except as per HPI.   Social History   Socioeconomic History   Marital status: Widowed    Spouse name: Not on  file   Number of children: 1   Years of education: Not on file   Highest education level: Master's degree (e.g., MA, MS, MEng, MEd, MSW, MBA)  Occupational History   Occupation: retired    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r-sr Runner, broadcasting/film/video  Tobacco Use   Smoking status: Former    Packs/day: 1.00    Years: 58.00    Additional pack years: 0.00    Total pack years: 58.00    Types: Cigarettes    Quit date: 10/22/2015    Years since quitting: 7.2    Passive exposure: Past   Smokeless tobacco: Never   Tobacco comments:    Chief Technology Officer Use    Vaping Use: Never used  Substance and Sexual Activity   Alcohol use: Never   Drug use: Never   Sexual activity: Not Currently    Birth control/protection: Surgical    Comment: HYSTERECTOMY  Other Topics Concern   Not on file  Social History Narrative   Not on file   Social Determinants of Health   Financial Resource Strain: Low Risk  (10/21/2021)   Overall Financial Resource Strain (CARDIA)    Difficulty of Paying Living Expenses: Not hard at all  Food Insecurity: No Food Insecurity (06/07/2022)   Hunger Vital Sign    Worried About Running Out of Food in the Last Year: Never true    Ran Out of Food in the Last Year: Never true  Transportation Needs: No Transportation Needs (06/07/2022)   PRAPARE - Administrator, Civil Service (Medical): No    Lack of Transportation (Non-Medical): No  Physical Activity: Insufficiently Active (10/21/2021)   Exercise Vital Sign    Days of Exercise per Week: 3 days    Minutes of Exercise per Session: 40 min  Stress: No Stress Concern Present (10/21/2021)   [employer] of Occupational Health - Occupational Stress Questionnaire    Feeling of Stress : Not at all  Social Connections: Not on file  Intimate Partner Violence: Not At Risk (06/02/2022)   Humiliation, Afraid, Rape, and Kick questionnaire    Fear of Current or Ex-Partner: No    Emotionally Abused: No    Physically Abused: No    Sexually Abused: No   Family History  Problem Relation Age of Onset   Hypertension Mother    Heart attack Father    Cancer Maternal Aunt        unknown type   Stomach cancer Neg Hx    Colon cancer Neg Hx    Esophageal cancer Neg Hx    Pancreatic cancer Neg Hx    Current Outpatient Medications  Medication Sig Dispense Refill   acetaminophen (TYLENOL) 500 MG tablet Take 1,000 mg by mouth every 6 (six) hours as needed for mild pain or fever.     albuterol (VENTOLIN HFA) 108 (90 Base) MCG/ACT inhaler INHALE 2 PUFFS BY MOUTH EVERY 6 HOURS AS  NEEDED FOR WHEEZE OR SHORTNESS OF BREATH 8.5 each 5   apixaban (ELIQUIS) 2.5 MG TABS tablet Take 1 tablet (2.5 mg total) by mouth 2 (two) times daily. 180 tablet 1   beta carotene w/minerals (OCUVITE) tablet Take 1 tablet by mouth in the morning.     Budeson-Glycopyrrol-Formoterol (BREZTRI AEROSPHERE) 160-9-4.8 MCG/ACT AERO Inhale 2 puffs into the lungs in the morning and at bedtime. 10.7 g 12   CALCIUM PO Take 1,200 mg by mouth in the morning.     Cinnamon 500 MG TABS Take 1,000 mg by mouth at bedtime.  ferrous sulfate 325 (65 FE) MG EC tablet Take 1 tablet (325 mg total) by mouth 2 (two) times daily with a meal. 180 tablet 1   JARDIANCE 10 MG TABS tablet TAKE 1 TABLET BY MOUTH EVERY DAY BEFORE BREAKFAST 30 tablet 3   KLOR-CON M20 20 MEQ tablet TAKE 1 TABLET BY MOUTH EVERY DAY 90 tablet 1   metFORMIN (GLUCOPHAGE) 1000 MG tablet TAKE 1 TABLET (1,000 MG TOTAL) BY MOUTH TWICE A DAY WITH FOOD 180 tablet 1   Multiple Vitamins-Minerals (CENTRUM SILVER 50+WOMEN PO) Take 1 tablet by mouth in the morning.     omeprazole (PRILOSEC) 40 MG capsule TAKE 1 CAPSULE BY MOUTH EVERY DAY 90 capsule 1   pravastatin (PRAVACHOL) 10 MG tablet TAKE 1 TABLET BY MOUTH EVERY DAY 90 tablet 1   Pseudoephedrine-guaiFENesin (MUCINEX D PO) Take 5 mLs by mouth at bedtime.     Spacer/Aero-Holding Rudean Curt Use with inhaler 1 each 2   tadalafil, PAH, (ADCIRCA) 20 MG tablet Take 2 tablets (40 mg total) by mouth daily. 180 tablet 3   torsemide (DEMADEX) 20 MG tablet Take 1.5 tablets (30 mg total) by mouth daily. 90 tablet 1   vitamin C (ASCORBIC ACID) 500 MG tablet Take 500 mg by mouth every Tuesday, Thursday, Saturday, and Sunday. In the morning.     vitamin E 180 MG (400 UNITS) capsule Take 400 Units by mouth every Monday, Wednesday, and Friday. In the morning.     Treprostinil (TYVASO DPI MAINTENANCE KIT) 16 MCG POWD Inhale 16 mcg into the lungs in the morning, at noon, in the evening, and at bedtime. 112 each 11   No  current facility-administered medications for this encounter.   Wt Readings from Last 3 Encounters:  01/04/23 49.6 kg (109 lb 6.4 oz)  01/03/23 49 kg (108 lb)  12/08/22 49.5 kg (109 lb 3.2 oz)   BP (!) 92/50   Pulse 87   Wt 49.6 kg (109 lb 6.4 oz)   SpO2 96%   BMI 20.01 kg/m  PHYSICAL EXAM: General: NAD Neck: No JVD, no thyromegaly or thyroid nodule.  Lungs: Occasional rhonchi CV: Nondisplaced PMI.  Heart regular S1/S2, no S3/S4, no murmur.  No peripheral edema.  No carotid bruit.  Normal pedal pulses.  Abdomen: Soft, nontender, no hepatosplenomegaly, no distention.  Skin: Intact without lesions or rashes.  Neurologic: Alert and oriented x 3.  Psych: Normal affect. Extremities: No clubbing or cyanosis.  HEENT: Normal.   Assessment/Plan:  1. RV failure: Echo in 2/23 showed EF 60-65%, mild LVH, D-shaped septum, moderate RV dysfunction and moderate RV enlargement, PASP 90 mmHg, mild-moderate TR.  RHC in 7/23 showed normal filling pressures but severe PAH. Echo in 9/23 with  EF 55-60%, D-shaped septum, moderate RV enlargement with moderately decreased systolic function, bubble study positive, PASP 64 mmHg. NYHA class III, very frail. She does not look volume overloaded on exam.  - Continue Torsemide to 30 mg daily. BMET/BNP today.  - Continue Jardiance 10 mg daily.  2. COPD: She no longer smokes.  She has advanced emphysema by CT. She is on home oxygen 4 L (stable)  3. H/o PE: She is on Eliquis at 2.5 mg bid (dosed lower b/c of age, weight).  4. Pulmonary hypertension: Echo in 2/23 showed EF 60-65%, mild LVH, D-shaped septum, moderate RV dysfunction and moderate RV enlargement, PASP 90 mmHg, mild-moderate TR. RHC 7/23 showed normal right and left filling pressures and severe pulmonary arterial hypertension with PVR 10 WU.  V/Q scan not suggestive of chronic PEs.  Severe emphysema by chest CT.  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 Referred to rheumatology, saw Dr. Dierdre Forth and says that she was not given a specific diagnosis. RHC in 9/23 showed moderate to severe PAH with PVR 8.6 and CI low but not markedly low at 2.1, RA and PCWP were not elevated. Given concern for possible group 1 component, tadalafil and Tyvaso were started.  She does not feel like Tyvaso has helped, felt much worse on 64 mcg and now on 32 mcg.  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  - Would keep tadalafil at 40 mg daily.   - Decrease Tyvaso to 16 mcg today.  If she feels better on the lower dose, would go ahead and titrate to off.  If she feels worse, go back to 32 mcg.  5. Positive bubble study: Suspect PFO.  Shunt run done with RHC in 9/23, no step up. This does not suggest the presence of a significant ASD.   Followup in 3 months.   Marca Ancona,  01/04/2023

## 2023-01-04 NOTE — Patient Instructions (Addendum)
Medication Changes:  DECREASE Tyvaso to 16 mcg   If you don't hear from the company within 24 hours, call the company for new dose.   Lab Work:  Labs done today, your results will be available in MyChart, we will contact you for abnormal readings.   Follow-Up in:   Your physician recommends that you schedule a follow-up appointment in: 3 months (August)   ** please call here or in [place] to schedule a follow up appointment in June**  [phone]   Do the following things EVERYDAY: Weigh yourself in the morning before breakfast. Write it down and keep it in a log. Take your medicines as prescribed Eat low salt foods--Limit salt (sodium) to 2000 mg per day.  Stay as active as you can everyday Limit all fluids for the day to less than 2 liters    Need to Contact us:  If you have any questions or concerns before your next appointment please send [place] a message through [place] or call our office at [phone].    TO LEAVE A MESSAGE FOR THE NURSE SELECT OPTION 2, PLEASE LEAVE A MESSAGE INCLUDING: YOUR NAME DATE OF BIRTH CALL BACK NUMBER REASON FOR CALL**this is important as we prioritize the call backs  YOU WILL RECEIVE A CALL BACK THE SAME DAY AS LONG AS YOU CALL BEFORE 4:00 PM   At the [facility], you and your health needs are our priority. As part of our continuing mission to provide you with exceptional heart care, we have created designated Provider Care Teams. These Care Teams include your primary Cardiologist (physician) and Advanced Practice Providers (APPs- Physician Assistants and Nurse Practitioners) who all work together to provide you with the care you need, when you need it.   You may see any of the following providers on your designated Care Team at your next follow up: Dr Arvilla Meres Dr Marca Ancona Dr. Marcos Eke, NP Robbie Lis, [place] [facility] [place], [place] Brynda Peon, NP Karle Plumber,  PharmD   Please be sure to bring in all your medications bottles to every appointment.    Thank you for choosing [facility]

## 2023-01-12 ENCOUNTER — Ambulatory Visit
Admission: RE | Admit: 2023-01-12 | Discharge: 2023-01-12 | Disposition: A | Discharge status: Home / Self Care | Payer: Medicare Other | Source: Ambulatory Visit | Attending: Pulmonary Disease | Admitting: Pulmonary Disease

## 2023-01-12 DIAGNOSIS — C3490 Malignant neoplasm of unspecified part of unspecified bronchus or lung: Secondary | ICD-10-CM | POA: Diagnosis not present

## 2023-01-17 ENCOUNTER — Other Ambulatory Visit: Payer: Self-pay

## 2023-01-17 ENCOUNTER — Telehealth: Payer: Self-pay | Admitting: Pulmonary Disease

## 2023-01-17 ENCOUNTER — Telehealth: Payer: Self-pay | Admitting: Nurse Practitioner

## 2023-01-17 DIAGNOSIS — R911 Solitary pulmonary nodule: Secondary | ICD-10-CM

## 2023-01-17 NOTE — Telephone Encounter (Signed)
Received call report from Tracey with [facility] on patient's CT Chest done on 01/12/23. Dr. Reece Agar, please review the result/impression copied below:  IMPRESSION: 1. Central right upper lobe solid 3.0 cm lung mass, compatible with malignancy, favoring a metachronous primary bronchogenic malignancy. 2. New right paratracheal and subcarinal lymphadenopathy, suspicious for nodal metastases. 3. Several (at least 5) new hypodense liver masses scattered throughout the liver, suspicious for liver metastases. 4. Suggest PET-CT for further characterization. 5. Stable postsurgical changes in the right lower lobe and stable postradiation change in the left upper lobe, with no evidence of local tumor recurrence in these locations. 6. Borderline mild cardiomegaly. Two-vessel coronary atherosclerosis. 7. Small hiatal hernia. 8. Left colonic diverticulosis. 9. Aortic Atherosclerosis (ICD10-I70.0) and Emphysema (ICD10-J43.9).  Please advise, thank you.

## 2023-01-17 NOTE — Telephone Encounter (Signed)
Contacted Ezra Sites to schedule their annual wellness visit. Appointment made for 01/27/23.  [name] AWV direct phone # [phone]

## 2023-01-21 ENCOUNTER — Other Ambulatory Visit: Payer: Medicare Other

## 2023-01-21 DIAGNOSIS — Z515 Encounter for palliative care: Secondary | ICD-10-CM

## 2023-01-21 NOTE — Progress Notes (Signed)
COMMUNITY PALLIATIVE CARE SW NOTE  PATIENT NAME: Brianna Adams DOB: [birth year] MRN: [id number]  PRIMARY CARE PROVIDER: Arnette Felts, [name]  RESPONSIBLE PARTY:  Acct ID - Guarantor Home Phone Work Phone Relationship Acct Type  [id number] Trudie Reed* [phone]  Self P/F     [address], [place], [place] [address]     PLAN OF CARE and INTERVENTIONS:              Palliative care encounter: SW met with patient and daughter, [name] for f/u home visit.  ACP: PPatient completed ACD with lawyer and indicated DNR status in paperwork and listing her daughter, [name], as her POA. confirmation conversation had today of code status. patient confirms desire to be DNR. DNR form left in the home.     Appetite: fair-poor  Discussion around consuming supplement drink with her pulmonologist.  Daughter advised they have not started this yet, but will.  Weights have been trending downward see below.     COPD (emphysema):  Continues on oxygen at 4 L via [place].  Using inhalers as directed.  Reporting shortness of breath with exertion. Feels fatigue is persistently declining.    Heart Failure: patient had CT of chest done 5/8 and schedueld to haved PET scan done 5/20. will f/u with pulmonology 5/21.  patient share does not weigh herself daily.   medication management: patient does not think that Tyvaso has helped, cardiology has decreased daige to .   Social: continues to go to the [employer] with her daughter. No lightheadedness or syncope, no chest pain., dizziness or headaches reported. daughter has not looked into recruiting a caregiver once a week yet nor have they looked into a PCP as of yet. daughter has resoruces. During visit patient shared that she was a retired Retail buyer and moved from [facility] to [place] where she lived for 30 yrs before transitioning to [place] with her daughter.   SOCIAL HX:  Social History   Tobacco Use   Smoking status: Former    Packs/day: 1.00    Years: 58.00    Additional pack  years: 0.00    Total pack years: 58.00    Types: Cigarettes    Quit date: 10/22/2015    Years since quitting: 7.2    Passive exposure: Past   Smokeless tobacco: Never   Tobacco comments:    congratulated  Substance Use Topics   Alcohol use: Never    CODE STATUS: DNR ADVANCED DIRECTIVES: Y MOST FORM COMPLETE:  N HOSPICE EDUCATION PROVIDED: N  PPS: 40%  Time spent: 40 min       [place], [place]

## 2023-01-24 ENCOUNTER — Ambulatory Visit
Admission: RE | Admit: 2023-01-24 | Discharge: 2023-01-24 | Disposition: A | Discharge status: Home / Self Care | Payer: Medicare Other | Source: Ambulatory Visit | Attending: Pulmonary Disease | Admitting: Pulmonary Disease

## 2023-01-24 DIAGNOSIS — R918 Other nonspecific abnormal finding of lung field: Secondary | ICD-10-CM | POA: Insufficient documentation

## 2023-01-24 DIAGNOSIS — R911 Solitary pulmonary nodule: Secondary | ICD-10-CM

## 2023-01-24 DIAGNOSIS — C787 Secondary malignant neoplasm of liver and intrahepatic bile duct: Secondary | ICD-10-CM | POA: Insufficient documentation

## 2023-01-24 LAB — GLUCOSE, CAPILLARY: Glucose-Capillary: 150 mg/dL — ABNORMAL HIGH (ref 70–99)

## 2023-01-24 MED ORDER — FLUDEOXYGLUCOSE F - 18 (FDG) INJECTION
5.6000 | Freq: Once | INTRAVENOUS | Status: AC | PRN
  Administered 2023-01-24: 6.2 via INTRAVENOUS

## 2023-01-25 ENCOUNTER — Encounter: Payer: Self-pay | Admitting: Pulmonary Disease

## 2023-01-25 ENCOUNTER — Ambulatory Visit (INDEPENDENT_AMBULATORY_CARE_PROVIDER_SITE_OTHER): Payer: Medicare Other | Admitting: Pulmonary Disease

## 2023-01-25 ENCOUNTER — Other Ambulatory Visit: Payer: Self-pay | Admitting: Nurse Practitioner

## 2023-01-25 VITALS — BP 118/58 | HR 89 | Temp 97.5°F | Ht 62.0 in | Wt 108.0 lb

## 2023-01-25 DIAGNOSIS — C3491 Malignant neoplasm of unspecified part of right bronchus or lung: Secondary | ICD-10-CM | POA: Diagnosis not present

## 2023-01-25 DIAGNOSIS — Z23 Encounter for immunization: Secondary | ICD-10-CM | POA: Diagnosis not present

## 2023-01-25 DIAGNOSIS — C787 Secondary malignant neoplasm of liver and intrahepatic bile duct: Secondary | ICD-10-CM

## 2023-01-25 DIAGNOSIS — J9611 Chronic respiratory failure with hypoxia: Secondary | ICD-10-CM

## 2023-01-25 DIAGNOSIS — R918 Other nonspecific abnormal finding of lung field: Secondary | ICD-10-CM | POA: Diagnosis not present

## 2023-01-25 DIAGNOSIS — I272 Pulmonary hypertension, unspecified: Secondary | ICD-10-CM | POA: Diagnosis not present

## 2023-01-25 DIAGNOSIS — J439 Emphysema, unspecified: Secondary | ICD-10-CM

## 2023-01-25 DIAGNOSIS — J4489 Other specified chronic obstructive pulmonary disease: Secondary | ICD-10-CM

## 2023-01-25 NOTE — Patient Instructions (Addendum)
You received your pneumonia shot today.  We are scheduling a biopsy by interventional radiology, of one of the liver spots noted.  Dr. Cathie Hoops will see you 1 week after your biopsy.  They will call you with the appointment.  We will see you in follow-up here in 4 to 6 weeks time but we will be in contact with you.

## 2023-01-25 NOTE — Progress Notes (Signed)
Subjective:    Patient ID: Brianna Adams, female    DOB: 1941-07-31, 82 y.o.   MRN: [id number] Patient Care Team: Arnette Felts, [name] as PCP - General (General Practice) Rickard Patience, MD as Consulting Physician (Oncology)  Chief Complaint  Patient presents with   Follow-up    SOB- all the time. No wheezing or cough.    HPI Brianna Adams is an 82 year old former smoker (58 PY) followed for the issue of COPD with bronchitis/emphysema and chronic respiratory failure.  She has a prior history of right lower lobe resection for lung cancer and empiric SBRT to a nodule in 2019.  She has a prior history of PE on Eliquis, chronic diastolic heart failure and multiple other issues as noted below.  She has severe pulmonary hypertension and is followed by the advanced heart failure clinic.  She has had negative connective tissue disease workup.  She is currently on Tyvaso and tadalafil for pulmonary hypertension.  She was last seen by me on 08 December 2022.This is a scheduled visit for follow-up today.  At her prior visit in April she was fairly well compensated and her oxygen requirements were stable at 4 L/min.  Also during her prior visit she had a referral to palliative care at the request of her daughter.  She continues to take Tyvaso and Adcirca for her pulmonary hypertension.  She notes that she is very short of breath despite these medications.  She is using Breztri however does admit that sometimes she forgets to take the second dose of the day.  This is a chronic issue for her.  She does note better relief of shortness of breath when she takes it regularly.   Daughter accompanies her today.  The patient had surveillance CT for lung cancer performed on 12 Jan 2023, this shows a right upper lobe solid 3.0 x 2.4 cm lung mass and associated paratracheal lymph node.  Patient also has hypodense liver masses.  She had a PET/CT performed yesterday which is still to be read by radiology however on my review the mass is  hypermetabolic along with the paratracheal adenopathy.  There are multiple FDG avid lesions in the liver.  Compatible with malignancy suspect this is a new process from prior.  Patient is a very high risk for pulmonary invasive procedures due to chronic respiratory failure and pulmonary hypertension.  She is definitely not a surgical candidate.  The patient and her daughter that the safest area to biopsy would be the liver.  Brianna Adams seem to understand findings as discussed with her.  She would like to proceed with evaluation by IR for biopsy.   Review of Systems A 10 point review of systems was performed and it is as noted above otherwise negative.  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   Hemorrhoids 12/01/2022   Hyperplastic polyp of intestine 12/01/2022   Lumbosacral radiculopathy 12/01/2022   Migraine 12/01/2022   Uterine leiomyoma 12/01/2022   Acute on chronic respiratory failure with hypoxia (HCC) 06/02/2022   Acute renal failure superimposed on stage 3a chronic kidney disease (HCC) 06/02/2022   Rectal bleeding    Mild non proliferative diabetic retinopathy (HCC) 08/05/2021   Encounter for preoperative pulmonary examination 07/21/2021   IDA (iron deficiency anemia) 06/24/2021   Squamous cell carcinoma lung, unspecified laterality (HCC) 06/04/2021   Pneumonia of right upper lobe due to infectious organism 03/09/2021   CKD (chronic kidney  disease), stage II 03/09/2021   Chronic diastolic CHF (congestive heart failure) (HCC) 03/09/2021   HTN (hypertension) 03/09/2021   HLD (hyperlipidemia) 03/09/2021   COPD with acute exacerbation (HCC) 03/09/2021   Elevated troponin 03/09/2021   Type 2 diabetes mellitus with hyperlipidemia (HCC) 03/09/2021   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/31/2020   Acute on chronic heart failure with preserved ejection fraction (HFpEF) (HCC) 12/09/2020   Lymphadenopathy     Pulmonary embolism (HCC) 02/29/2020   Chronic respiratory failure with hypoxia (HCC) 02/18/2020   Pulmonary nodule 02/18/2020   GERD (gastroesophageal reflux disease) 02/18/2020   Pulmonary hypertension, unspecified (HCC) 10/04/2019   COPD with chronic bronchitis and emphysema (HCC) 03/14/2019   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) 03/14/2019   Mass of thyroid gland 12/07/2016   Adrenal adenoma 11/18/2016   Non-toxic multinodular goiter 11/18/2016   Mass of left adrenal gland (HCC) 11/02/2016   Cataract 10/21/2016   Paresthesia of lower extremity 08/05/2016   Trochanteric bursitis of right hip 06/06/2016   Osteopenia 05/24/2016   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) 08/06/2014   Ventricular bigeminy 02/05/2011   Seizure (HCC) 09/06/1949   Social History   Tobacco Use   Smoking status: Former    Packs/day: 1.00    Years: 58.00    Additional pack years: 0.00    Total pack years: 58.00    Types: Cigarettes    Quit date: 10/22/2015    Years since quitting: 7.2    Passive exposure: Past   Smokeless tobacco: Never   Tobacco comments:    congratulated  Substance Use Topics   Alcohol use: Never   Allergies  Allergen Reactions   Ace Inhibitors Swelling   Penicillin G Hives, Shortness Of Breath and Rash   Zestril [Lisinopril] Swelling   Cortisone Other (See Comments)    Unknown reaction   Hydrocortisone Other (See Comments)    Unknown reaction   Lipitor [Atorvastatin] Other (See Comments)    Myalgias (Muscle Pain) in high dosages     Mobic [Meloxicam] Other (See Comments)    Bloody stools   Pravachol [Pravastatin] Other (See Comments)    Chest Pain if high dosage    Current Meds  Medication Sig   acetaminophen (TYLENOL) 500 MG tablet Take 1,000 mg by mouth every 6 (six) hours as needed for mild pain or fever.   albuterol (VENTOLIN HFA) 108 (90 Base) MCG/ACT inhaler INHALE 2 PUFFS BY MOUTH EVERY 6 HOURS AS NEEDED FOR WHEEZE OR  SHORTNESS OF BREATH   apixaban (ELIQUIS) 2.5 MG TABS tablet Take 1 tablet (2.5 mg total) by mouth 2 (two) times daily.   beta carotene w/minerals (OCUVITE) tablet Take 1 tablet by mouth in the morning.   Budeson-Glycopyrrol-Formoterol (BREZTRI AEROSPHERE) 160-9-4.8 MCG/ACT AERO Inhale 2 puffs into the lungs in the morning and at bedtime.   CALCIUM PO Take 1,200 mg by mouth in the morning.   Cinnamon 500 MG TABS Take 1,000 mg by mouth at bedtime.   ferrous sulfate 325 (65 FE) MG EC tablet Take 1 tablet (325 mg total) by mouth 2 (two) times daily with a meal.   JARDIANCE 10 MG TABS tablet TAKE 1 TABLET BY MOUTH EVERY DAY BEFORE BREAKFAST   KLOR-CON M20 20 MEQ tablet TAKE 1 TABLET BY MOUTH EVERY DAY   metFORMIN (GLUCOPHAGE) 1000 MG tablet TAKE 1 TABLET (1,000 MG TOTAL) BY MOUTH TWICE A DAY WITH FOOD   Multiple Vitamins-Minerals (CENTRUM SILVER 50+WOMEN PO) Take  1 tablet by mouth in the morning.   omeprazole (PRILOSEC) 40 MG capsule TAKE 1 CAPSULE BY MOUTH EVERY DAY   pravastatin (PRAVACHOL) 10 MG tablet TAKE 1 TABLET BY MOUTH EVERY DAY   Pseudoephedrine-guaiFENesin (MUCINEX D PO) Take 5 mLs by mouth at bedtime.   Spacer/Aero-Holding Rudean Curt Use with inhaler   tadalafil, PAH, (ADCIRCA) 20 MG tablet Take 2 tablets (40 mg total) by mouth daily.   torsemide (DEMADEX) 20 MG tablet Take 1.5 tablets (30 mg total) by mouth daily.   Treprostinil (TYVASO DPI MAINTENANCE KIT) 16 MCG POWD Inhale 16 mcg into the lungs in the morning, at noon, in the evening, and at bedtime.   vitamin C (ASCORBIC ACID) 500 MG tablet Take 500 mg by mouth every Tuesday, Thursday, Saturday, and Sunday. In the morning.   vitamin E 180 MG (400 UNITS) capsule Take 400 Units by mouth every Monday, Wednesday, and Friday. In the morning.   Immunization History  Administered Date(s) Administered   Fluad Quad(high Dose 65+) 05/24/2019, 05/13/2020, 06/18/2022   Influenza, High Dose Seasonal PF 06/01/2021   Influenza, Quadrivalent,  Recombinant, Inj, Pf 07/15/2017   Influenza,inj,quad, With Preservative 05/29/2015, 06/06/2016, 06/06/2018   Influenza-Unspecified 06/06/2018, 06/18/2022   Moderna Covid-19 Vaccine Bivalent Booster 32yrs & up 04/24/2022   PFIZER(Purple Top)SARS-COV-2 Vaccination 10/20/2019, 11/19/2019, 06/11/2020   Pfizer Covid-19 Vaccine Bivalent Booster 75yrs & up 06/01/2021   Pneumococcal Conjugate-13 10/31/2013   Pneumococcal Polysaccharide-23 12/05/1997, 11/05/2007, 04/07/2015   Respiratory Syncytial Virus Vaccine,Recomb Aduvanted(Arexvy) 09/20/2022   Rsv, Bivalent, Protein Subunit Rsvpref,pf (Abrysvo) 09/20/2022   Td 09/07/2003   Td (Adult),unspecified 09/07/2003   Tdap 08/07/2015, 08/17/2017   Zoster Recombinat (Shingrix) 08/17/2017, 06/28/2019, 09/05/2019   Zoster, Live 11/05/2010       Objective:   Physical Exam BP (!) 118/58 (BP Location: Left Arm, Cuff Size: Normal)   Pulse 89   Temp (!) 97.5 F (36.4 C)   Ht 5\' 2"  (1.575 m)   Wt 108 lb (49 kg) Comment: per patient. in a wheelchair today  SpO2 92%   BMI 19.75 kg/m   SpO2: 92 % O2 Device: Nasal cannula O2 Flow Rate (L/min): 4 L/min O2 Type: Continuous O2  GENERAL: Well-developed, thin, elderly woman in no acute distress, mild tachypnea, she presents in transport chair.  Wearing oxygen at 4 L/min via nasal cannula. HEAD: Normocephalic, atraumatic.  EYES: Pupils equal, round, reactive to light.  No scleral icterus.  MOUTH: Oral mucosa moist.  No thrush NECK: Supple. No thyromegaly. Trachea midline.  Significant JVD.  No adenopathy. PULMONARY: Distant breath sounds, coarse, no adventitious sounds. CARDIOVASCULAR: S1 and S2. Regular rate and rhythm.  Previously noted murmur not appreciated today.  GASTROINTESTINAL: Benign. MUSCULOSKELETAL: No joint deformity, no clubbing, no edema LE's.  Significant sarcopenia. NEUROLOGIC: No focal deficit.  Speech is fluent.  SKIN: Intact,warm,dry.  Limited exam no rashes PSYCH: Mood and behavior  appropriate.   Representative images of the CT chest performed 12 Jan 2023 showing the 3 cm mass on the right upper lobe and paratracheal lymphadenopathy:     Representative images from the PET/CT performed 24 Jan 2023 showing FDG avid right upper lobe mass, paratracheal adenopathy and liver mets:         Assessment & Plan:     ICD-10-CM   1. Lung mass  R91.8 CT BIOPSY   Not a candidate for invasive procedures in the lung This is carcinoma until proven otherwise This was not evident on CT performed May 2023  2. 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 Squamous cell carcinoma of lung, right (HCC)  C34.91    Right lower lobectomy in the past Status post SBRT for lung nodule on LEFT in 2019 Suspect this may represent a different histology vs metachronous    4. Severe pulmonary hypertension (HCC)  I27.20    Being managed by cardiology On Tyvaso and Adcirca On oxygen at 4 L/min, continue    5. COPD with chronic bronchitis and emphysema (HCC)  J44.89    J43.9    Continue Breztri Try to remember to use twice a day    6. Chronic respiratory failure with hypoxia (HCC)  J96.11    Continue oxygen at 4 L/min Patient compliant with therapy    7. Encounter for immunization  Z23 Pneumococcal conjugate vaccine 20-valent   Received Prevnar 20 today     Orders Placed This Encounter  Procedures   CT BIOPSY    Patient on Eliquis.    Standing Status:   Future    Standing Expiration Date:   01/25/2024    Order Specific Question:   Lab orders requested (DO NOT place separate lab orders, these will be automatically ordered during procedure specimen collection):    Answer:   Surgical Pathology    Order Specific Question:   Reason for Exam (SYMPTOM  OR DIAGNOSIS REQUIRED)    Answer:   Liver Mets    Order Specific Question:   Preferred location?    Answer:   [facility]   Pneumococcal conjugate  vaccine 20-valent   Patient will be scheduled for a biopsy of one of her liver lesions by IR.  This is the safest means for biopsy for this patient.  I also notified Dr. Cathie Hoops the patient's primary oncologist, about the above findings and she will follow the patient in 1 week after her biopsy.  Will see the patient in follow-up in 4 to 6 weeks time, she is to contact us prior to that time should any new difficulties arise.  Brianna Shelter, MD Advanced Bronchoscopy PCCM [facility]    *This note was dictated using voice recognition software/Dragon.  Despite best efforts to proofread, errors can occur which can change the meaning. Any transcriptional errors that result from this process are unintentional and may not be fully corrected at the time of dictation.

## 2023-01-26 ENCOUNTER — Telehealth: Payer: Self-pay

## 2023-01-26 NOTE — Telephone Encounter (Signed)
I added the comment to my note from yesterday.  They do have to go to the end of the note.

## 2023-01-26 NOTE — Progress Notes (Signed)
Pernell Dupre, MD sent to Brianna Adams; P Ir Procedure Requests Approved for US liver lesion core biopsy.  Thanks, [employer]

## 2023-01-26 NOTE — Telephone Encounter (Signed)
-----   Message from Lilian Kapur sent at 01/26/2023 11:24 AM EDT ----- Regarding: FW: CT BIOPSY This is Janet's response below about eliquis  Synetta Fail ----- Message ----- From: Markus Daft Sent: 01/25/2023  11:30 AM EDT To: Lilian Kapur Subject: RE: CT BIOPSY                                   Certainly. Does she want an US Liver biopsy or a CT Lung mass biopsy? Either one, you will need a med clearance from the prescribing MD for the Eliquis stating that she stop that medication for 48 hours prior to the procedure and then resume 24 hours after. It can be entered into Epic.  Just let me know when you have gotten that and I can get her scheduled. I am sending this for IR Approval right now.      Thanks so much! Marylu Lund    ----- Message ----- From: Lilian Kapur Sent: 01/25/2023  11:07 AM EDT To: Markus Daft Subject: CT BIOPSY                                      Can you work on getting this scheduled for the patient  Thanks Synetta Fail

## 2023-01-27 ENCOUNTER — Ambulatory Visit (INDEPENDENT_AMBULATORY_CARE_PROVIDER_SITE_OTHER): Payer: Medicare Other

## 2023-01-27 VITALS — BP 110/60 | HR 92 | Temp 98.6°F | Ht 62.0 in | Wt 108.6 lb

## 2023-01-27 DIAGNOSIS — Z0000 Encounter for general adult medical examination without abnormal findings: Secondary | ICD-10-CM

## 2023-01-27 NOTE — Telephone Encounter (Addendum)
Patient's daughter, Benita(DPR) is aware of below appt and voiced her understanding.  Nothing further needed.

## 2023-01-27 NOTE — Telephone Encounter (Signed)
Brianna Adams has scheduled the US liver biopsy on 02/15/23 @ 10:00am for the patient

## 2023-01-27 NOTE — Progress Notes (Signed)
Subjective:   Brianna Adams is a 82 y.o. female who presents for Medicare Annual (Subsequent) preventive examination.  Review of Systems     Cardiac Risk Factors include: advanced age (>58men, >73 women);diabetes mellitus;dyslipidemia     Objective:    Today's Vitals   01/27/23 1120  BP: 110/60  Pulse: 92  Temp: 98.6 F (37 C)  TempSrc: Oral  SpO2: 91%  Weight: 108 lb 9.6 oz (49.3 kg)  Height: 5\' 2"  (1.575 m)   Body mass index is 19.86 kg/m.     01/27/2023   11:38 AM 10/20/2022   10:03 AM 06/02/2022    9:00 PM 04/20/2022    1:33 PM 10/22/2021    1:37 PM 10/21/2021    9:04 AM 10/20/2021   10:49 AM  Advanced Directives  Does Patient Have a Medical Advance Directive? Yes No;Yes Yes No Yes Yes Yes  Type of Advance Directive Out of facility DNR (pink MOST or yellow form) Healthcare Power of [place];Living will Living will   Living will Living will;Healthcare Power of Attorney  Does patient want to make changes to medical advance directive?   No - Patient declined      Copy of Healthcare Power of Attorney in Chart?  No - copy requested       Would patient like information on creating a medical advance directive?    No - Patient declined   No - Patient declined    Current Medications (verified) Outpatient Encounter Medications as of 01/27/2023  Medication Sig   acetaminophen (TYLENOL) 500 MG tablet Take 1,000 mg by mouth every 6 (six) hours as needed for mild pain or fever.   albuterol (VENTOLIN HFA) 108 (90 Base) MCG/ACT inhaler INHALE 2 PUFFS BY MOUTH EVERY 6 HOURS AS NEEDED FOR WHEEZE OR SHORTNESS OF BREATH   apixaban (ELIQUIS) 2.5 MG TABS tablet Take 1 tablet (2.5 mg total) by mouth 2 (two) times daily.   beta carotene w/minerals (OCUVITE) tablet Take 1 tablet by mouth in the morning.   Budeson-Glycopyrrol-Formoterol (BREZTRI AEROSPHERE) 160-9-4.8 MCG/ACT AERO Inhale 2 puffs into the lungs in the morning and at bedtime.   CALCIUM PO Take 1,200 mg by mouth in the morning.    Cinnamon 500 MG TABS Take 1,000 mg by mouth at bedtime.   ferrous sulfate 325 (65 FE) MG EC tablet Take 1 tablet (325 mg total) by mouth 2 (two) times daily with a meal.   JARDIANCE 10 MG TABS tablet TAKE ONE TABLET BY MOUTH EVERY DAY FOR BREAKFAST   KLOR-CON M20 20 MEQ tablet TAKE 1 TABLET BY MOUTH EVERY DAY   metFORMIN (GLUCOPHAGE) 1000 MG tablet TAKE 1 TABLET (1,000 MG TOTAL) BY MOUTH TWICE A DAY WITH FOOD   Multiple Vitamins-Minerals (CENTRUM SILVER 50+WOMEN PO) Take 1 tablet by mouth in the morning.   omeprazole (PRILOSEC) 40 MG capsule TAKE 1 CAPSULE BY MOUTH EVERY DAY   pravastatin (PRAVACHOL) 10 MG tablet TAKE 1 TABLET BY MOUTH EVERY DAY   Pseudoephedrine-guaiFENesin (MUCINEX D PO) Take 5 mLs by mouth at bedtime.   Spacer/Aero-Holding Rudean Curt Use with inhaler   tadalafil, PAH, (ADCIRCA) 20 MG tablet Take 2 tablets (40 mg total) by mouth daily.   torsemide (DEMADEX) 20 MG tablet Take 1.5 tablets (30 mg total) by mouth daily.   Treprostinil (TYVASO DPI MAINTENANCE KIT) 16 MCG POWD Inhale 16 mcg into the lungs in the morning, at noon, in the evening, and at bedtime.   vitamin C (ASCORBIC ACID) 500 MG tablet  Take 500 mg by mouth every Tuesday, Thursday, Saturday, and Sunday. In the morning.   vitamin E 180 MG (400 UNITS) capsule Take 400 Units by mouth every Monday, Wednesday, and Friday. In the morning.   No facility-administered encounter medications on file as of 01/27/2023.    Allergies (verified) Ace inhibitors, Penicillin g, Zestril [lisinopril], Cortisone, Hydrocortisone, Lipitor [atorvastatin], Mobic [meloxicam], and Pravachol [pravastatin]   History: Past Medical History:  Diagnosis Date   CHF (congestive heart failure) (HCC)    COPD (chronic obstructive pulmonary disease) (HCC)    Diabetes (HCC)    type 2   Diverticulitis    Dyspnea    On Oxygen 3L via [place]    GERD (gastroesophageal reflux disease)    High cholesterol    Lung cancer (HCC)    Mild non proliferative  diabetic retinopathy (HCC) 08/05/2021   PE (pulmonary thromboembolism) (HCC) 02/29/2020   Pulmonary hypertension (HCC)    Uterine cancer [facility])    Past Surgical History:  Procedure Laterality Date   ABDOMINAL HYSTERECTOMY     cataract surgery  2012 and 2017   COLONOSCOPY     FIBEROPTIC BRONCHOSCOPY     FLEXIBLE SIGMOIDOSCOPY N/A 08/13/2021   Procedure: FLEXIBLE SIGMOIDOSCOPY;  Surgeon: Imogene Burn, MD;  Location: [facility];  Service: Gastroenterology;  Laterality: N/A;   GANGLION CYST EXCISION Left 09/11/2020   Procedure: EXCISION VOLAR RADIAL GANGLION OF LEFT WRIST;  Surgeon: Cindee Salt, MD;  Location: [facility] OR;  Service: Orthopedics;  Laterality: Left;  AXILLARY BLOCK   resection of lung cancer  2018   RIGHT HEART CATH N/A 10/20/2021   Procedure: RIGHT HEART CATH;  Surgeon: Marykay Lex, MD;  Location: [facility] INVASIVE CV LAB;  Service: Cardiovascular;  Laterality: N/A;   RIGHT HEART CATH N/A 03/18/2022   Procedure: RIGHT HEART CATH;  Surgeon: Laurey Morale, MD;  Location: [facility] INVASIVE CV LAB;  Service: Cardiovascular;  Laterality: N/A;   RIGHT HEART CATH N/A 06/04/2022   Procedure: RIGHT HEART CATH;  Surgeon: Laurey Morale, MD;  Location: [facility] INVASIVE CV LAB;  Service: Cardiovascular;  Laterality: N/A;   right lower lobe non-anatomocal lung resection wedge     right thorascoscopy     Family History  Problem Relation Age of Onset   Hypertension Mother    Heart attack Father    Cancer Maternal Aunt        unknown type   Stomach cancer Neg Hx    Colon cancer Neg Hx    Esophageal cancer Neg Hx    Pancreatic cancer Neg Hx    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, broadcasting/film/video  Tobacco Use   Smoking status: Former     Packs/day: 1.00    Years: 58.00    Additional pack years: 0.00    Total pack years: 58.00    Types: Cigarettes    Quit date: 10/22/2015    Years since quitting: 7.2    Passive exposure: Past   Smokeless tobacco: Never   Tobacco comments:    congratulated  Vaping Use   Vaping Use: Never used  Substance and Sexual Activity   Alcohol use: Never   Drug use: Never  Sexual activity: Not Currently    Birth control/protection: Surgical    Comment: HYSTERECTOMY  Other Topics Concern   Not on file  Social History Narrative   Not on file   Social Determinants of Health   Financial Resource Strain: Low Risk  (01/27/2023)   Overall Financial Resource Strain (CARDIA)    Difficulty of Paying Living Expenses: Not hard at all  Food Insecurity: No Food Insecurity (01/27/2023)   Hunger Vital Sign    Worried About Running Out of Food in the Last Year: Never true    Ran Out of Food in the Last Year: Never true  Transportation Needs: No Transportation Needs (01/27/2023)   PRAPARE - Administrator, Civil Service (Medical): No    Lack of Transportation (Non-Medical): No  Physical Activity: Insufficiently Active (01/27/2023)   Exercise Vital Sign    Days of Exercise per Week: 2 days    Minutes of Exercise per Session: 60 min  Stress: No Stress Concern Present (01/27/2023)   [employer] of Occupational Health - Occupational Stress Questionnaire    Feeling of Stress : Not at all  Social Connections: Not on file    Tobacco Counseling Counseling given: Not Answered Tobacco comments: congratulated   Clinical Intake:  Pre-visit preparation completed: Yes  Pain : No/denies pain     Nutritional Status: BMI of 19-24  Normal Nutritional Risks: None Diabetes: Yes  How often do you need to have someone help you when you read instructions, pamphlets, or other written materials from your doctor or pharmacy?: 1 - Never  Diabetic? Yes Nutrition Risk Assessment:  Has the  patient had any N/V/D within the last 2 months?  No  Does the patient have any non-healing wounds?  No  Has the patient had any unintentional weight loss or weight gain?  No   Diabetes:  Is the patient diabetic?  Yes  If diabetic, was a CBG obtained today?  No  Did the patient bring in their glucometer from home?  No  How often do you monitor your CBG's? Does not.   Financial Strains and Diabetes Management:  Are you having any financial strains with the device, your supplies or your medication? No .  Does the patient want to be seen by Chronic Care Management for management of their diabetes?  No  Would the patient like to be referred to a Nutritionist or for Diabetic Management?  No   Diabetic Exams:  Diabetic Eye Exam: Overdue for diabetic eye exam. Pt has been advised about the importance in completing this exam. Patient advised to call and schedule an eye exam. Diabetic Foot Exam: Completed 01/07/2022   Interpreter Needed?: No  Information entered by :: NAllen LPN   Activities of Daily Living    01/27/2023   11:40 AM 06/02/2022    9:00 PM  In your present state of health, do you have any difficulty performing the following activities:  Hearing? 0 0  Vision? 0 0  Difficulty concentrating or making decisions? 0 1  Walking or climbing stairs? 1 1  Dressing or bathing? 0 1  Doing errands, shopping? 1 1  Preparing Food and eating ? N   Using the Toilet? N   In the past six months, have you accidently leaked urine? N   Do you have problems with loss of bowel control? N   Managing your Medications? N   Comment prepackaged meds   Managing your Finances? N   Housekeeping or managing your Housekeeping?  N     Patient Care Team: Arnette Felts, [name] as PCP - General (General Practice) Rickard Patience, MD as Consulting Physician (Oncology) Salena Saner, MD as Consulting Physician (Pulmonary Disease)  Indicate any recent Medical Services you may have received from other than  [facility] providers in the past year (date may be approximate).     Assessment:   This is a routine wellness examination for Brianna Adams.  Hearing/Vision screen Vision Screening - Comments:: No regular eye exams  Dietary issues and exercise activities discussed: Current Exercise Habits: Home exercise routine, Type of exercise: strength training/weights;stretching, Time (Minutes): 60, Frequency (Times/Week): 2, Weekly Exercise (Minutes/Week): 120   Goals Addressed             This Visit's Progress    Patient Stated       01/27/2023, wants to breathe better       Depression Screen    01/27/2023   11:39 AM 10/21/2021    9:05 AM 10/21/2020   11:00 AM 10/09/2020   10:26 AM 04/21/2020    4:28 PM 10/04/2019    3:27 PM 10/04/2019    2:27 PM  PHQ 2/9 Scores  PHQ - 2 Score 0 0 0 0 0 0 0  PHQ- 9 Score 3  0  0 0     Fall Risk    01/27/2023   11:39 AM 10/21/2021    9:04 AM 10/09/2020   10:26 AM 04/03/2020    2:21 PM 02/28/2020   11:04 AM  Fall Risk   Falls in the past year? 0 0 0 0 0  Number falls in past yr: 0   0   Injury with Fall? 0   0   Risk for fall due to : Medication side effect;Impaired balance/gait;Impaired mobility Medication side effect Medication side effect Impaired balance/gait No Fall Risks  Follow up Falls prevention discussed;Education provided;Falls evaluation completed Falls evaluation completed;Education provided;Falls prevention discussed Falls evaluation completed;Education provided;Falls prevention discussed Falls evaluation completed Falls prevention discussed    FALL RISK PREVENTION PERTAINING TO THE HOME:  Any stairs in or around the home? Yes  If so, are there any without handrails? No  Home free of loose throw rugs in walkways, pet beds, electrical cords, etc? Yes  Adequate lighting in your home to reduce risk of falls? Yes   ASSISTIVE DEVICES UTILIZED TO PREVENT FALLS:  Life alert? Yes  Use of a cane, walker or w/c? Yes  Grab bars in the bathroom? Yes   Shower chair or bench in shower? Yes  Elevated toilet seat or a handicapped toilet? Yes   TIMED UP AND GO:  Was the test performed? No .       Cognitive Function:        01/27/2023   11:42 AM 10/21/2021    9:07 AM 10/09/2020   10:30 AM 10/04/2019    3:29 PM  6CIT Screen  What Year? 0 points 0 points 0 points 0 points  What month? 0 points 0 points 0 points 0 points  What time? 0 points 0 points 0 points 3 points  Count back from 20 0 points 0 points 0 points 0 points  Months in reverse 0 points 0 points 0 points 0 points  Repeat phrase 6 points 8 points 6 points 2 points  Total Score 6 points 8 points 6 points 5 points    Immunizations Immunization History  Administered Date(s) Administered   Fluad Quad(high Dose 65+) 05/24/2019, 05/13/2020, 06/18/2022   Influenza, High  Dose Seasonal PF 06/01/2021   Influenza, Quadrivalent, Recombinant, Inj, Pf 07/15/2017   Influenza,inj,quad, With Preservative 05/29/2015, 06/06/2016, 06/06/2018   Influenza-Unspecified 06/06/2018, 06/18/2022   Moderna Covid-19 Vaccine Bivalent Booster 70yrs & up 04/24/2022   PFIZER(Purple Top)SARS-COV-2 Vaccination 10/20/2019, 11/19/2019, 06/11/2020   PNEUMOCOCCAL CONJUGATE-20 01/25/2023   Pfizer Covid-19 Vaccine Bivalent Booster 43yrs & up 06/01/2021   Pneumococcal Conjugate-13 10/31/2013   Pneumococcal Polysaccharide-23 12/05/1997, 11/05/2007, 04/07/2015   Respiratory Syncytial Virus Vaccine,Recomb Aduvanted(Arexvy) 09/20/2022   Rsv, Bivalent, Protein Subunit Rsvpref,pf Verdis Frederickson) 09/20/2022   Td 09/07/2003   Td (Adult),unspecified 09/07/2003   Tdap 08/07/2015, 08/17/2017   Zoster Recombinat (Shingrix) 08/17/2017, 06/28/2019, 09/05/2019   Zoster, Live 11/05/2010    TDAP status: Up to date  Flu Vaccine status: Up to date  Pneumococcal vaccine status: Up to date  Covid-19 vaccine status: Completed vaccines  Qualifies for Shingles Vaccine? Yes   Zostavax completed Yes   Shingrix Completed?:  Yes  Screening Tests Health Maintenance  Topic Date Due   COVID-19 Vaccine (6 - 2023-24 season) 06/19/2022   OPHTHALMOLOGY EXAM  08/04/2022   Diabetic kidney evaluation - Urine ACR  10/06/2022   HEMOGLOBIN A1C  01/04/2023   FOOT EXAM  01/08/2023   MAMMOGRAM  02/19/2023   INFLUENZA VACCINE  04/07/2023   Diabetic kidney evaluation - eGFR measurement  01/04/2024   DTaP/Tdap/Td (5 - Td or Tdap) 08/18/2027   Pneumonia Vaccine 58+ Years old  Completed   DEXA SCAN  Completed   Zoster Vaccines- Shingrix  Completed   HPV VACCINES  Aged Out    Health Maintenance  Health Maintenance Due  Topic Date Due   COVID-19 Vaccine (6 - 2023-24 season) 06/19/2022   OPHTHALMOLOGY EXAM  08/04/2022   Diabetic kidney evaluation - Urine ACR  10/06/2022   HEMOGLOBIN A1C  01/04/2023   FOOT EXAM  01/08/2023    Colorectal cancer screening: No longer required.   Mammogram status: Completed 02/18/2022. Repeat every year  Bone Density status: scheduled for 08/09/2023  Lung Cancer Screening: (Low Dose CT Chest recommended if Age 58-80 years, 30 pack-year currently smoking OR have quit w/in 15years.) does not qualify.   Lung Cancer Screening Referral: no  Additional Screening:  Hepatitis C Screening: does not qualify;   Vision Screening: Recommended annual ophthalmology exams for early detection of glaucoma and other disorders of the eye. Is the patient up to date with their annual eye exam?  No  Who is the provider or what is the name of the office in which the patient attends annual eye exams? none If pt is not established with a provider, would they like to be referred to a provider to establish care? No .   Dental Screening: Recommended annual dental exams for proper oral hygiene  Community Resource Referral / Chronic Care Management: CRR required this visit?  No   CCM required this visit?  No      Plan:     I have personally reviewed and noted the following in the patient's chart:    Medical and social history Use of alcohol, tobacco or illicit drugs  Current medications and supplements including opioid prescriptions. Patient is not currently taking opioid prescriptions. Functional ability and status Nutritional status Physical activity Advanced directives List of other physicians Hospitalizations, surgeries, and ER visits in previous 12 months Vitals Screenings to include cognitive, depression, and falls Referrals and appointments  In addition, I have reviewed and discussed with patient certain preventive protocols, quality metrics, and best practice recommendations. A written personalized  care plan for preventive services as well as general preventive health recommendations were provided to patient.     Barb Merino, LPN   12/13/8117   Nurse Notes: none

## 2023-01-27 NOTE — Patient Instructions (Signed)
Brianna Adams , Thank you for taking time to come for your Medicare Wellness Visit. I appreciate your ongoing commitment to your health goals. Please review the following plan we discussed and let me know if I can assist you in the future.   These are the goals we discussed:  Goals      Patient Stated     10/04/2019, wants to have raised area on left wrist taken care of and increase breathing capacity     Patient Stated     10/09/2020, wants breathing to improve     Patient Stated     10/21/2021, wants breathing to improve     Patient Stated     01/27/2023, wants to breathe better        This is a list of the screening recommended for you and due dates:  Health Maintenance  Topic Date Due   COVID-19 Vaccine (6 - 2023-24 season) 06/19/2022   Eye exam for diabetics  08/04/2022   Yearly kidney health urinalysis for diabetes  10/06/2022   Hemoglobin A1C  01/04/2023   Complete foot exam   01/08/2023   Mammogram  02/19/2023   Flu Shot  04/07/2023   Yearly kidney function blood test for diabetes  01/04/2024   DTaP/Tdap/Td vaccine (5 - Td or Tdap) 08/18/2027   Pneumonia Vaccine  Completed   DEXA scan (bone density measurement)  Completed   Zoster (Shingles) Vaccine  Completed   HPV Vaccine  Aged Out    Advanced directives: copy in chart  Conditions/risks identified: none  Next appointment: Follow up in one year for your annual wellness visit    Preventive Care 65 Years and Older, Female Preventive care refers to lifestyle choices and visits with your health care provider that can promote health and wellness. What does preventive care include? A yearly physical exam. This is also called an annual well check. Dental exams once or twice a year. Routine eye exams. Ask your health care provider how often you should have your eyes checked. Personal lifestyle choices, including: Daily care of your teeth and gums. Regular physical activity. Eating a healthy diet. Avoiding tobacco and  drug use. Limiting alcohol use. Practicing safe sex. Taking low-dose aspirin every day. Taking vitamin and mineral supplements as recommended by your health care provider. What happens during an annual well check? The services and screenings done by your health care provider during your annual well check will depend on your age, overall health, lifestyle risk factors, and family history of disease. Counseling  Your health care provider may ask you questions about your: Alcohol use. Tobacco use. Drug use. Emotional well-being. Home and relationship well-being. Sexual activity. Eating habits. History of falls. Memory and ability to understand (cognition). Work and work Astronomer. Reproductive health. Screening  You may have the following tests or measurements: Height, weight, and BMI. Blood pressure. Lipid and cholesterol levels. These may be checked every 5 years, or more frequently if you are over 30 years old. Skin check. Lung cancer screening. You may have this screening every year starting at age 66 if you have a 30-pack-year history of smoking and currently smoke or have quit within the past 15 years. Fecal occult blood test (FOBT) of the stool. You may have this test every year starting at age 64. Flexible sigmoidoscopy or colonoscopy. You may have a sigmoidoscopy every 5 years or a colonoscopy every 10 years starting at age 16. Hepatitis C blood test. Hepatitis B blood test. Sexually transmitted disease (STD)  testing. Diabetes screening. This is done by checking your blood sugar (glucose) after you have not eaten for a while (fasting). You may have this done every 1-3 years. Bone density scan. This is done to screen for osteoporosis. You may have this done starting at age 85. Mammogram. This may be done every 1-2 years. Talk to your health care provider about how often you should have regular mammograms. Talk with your health care provider about your test results, treatment  options, and if necessary, the need for more tests. Vaccines  Your health care provider may recommend certain vaccines, such as: Influenza vaccine. This is recommended every year. Tetanus, diphtheria, and acellular pertussis (Tdap, Td) vaccine. You may need a Td booster every 10 years. Zoster vaccine. You may need this after age 20. Pneumococcal 13-valent conjugate (PCV13) vaccine. One dose is recommended after age 50. Pneumococcal polysaccharide (PPSV23) vaccine. One dose is recommended after age 48. Talk to your health care provider about which screenings and vaccines you need and how often you need them. This information is not intended to replace advice given to you by your health care provider. Make sure you discuss any questions you have with your health care provider. Document Released: 09/19/2015 Document Revised: 05/12/2016 Document Reviewed: 06/24/2015 Elsevier Interactive Patient Education  2017 [employer].  Fall Prevention in the Home Falls can cause injuries. They can happen to people of all ages. There are many things you can do to make your home safe and to help prevent falls. What can I do on the outside of my home? Regularly fix the edges of walkways and driveways and fix any cracks. Remove anything that might make you trip as you walk through a door, such as a raised step or threshold. Trim any bushes or trees on the path to your home. Use bright outdoor lighting. Clear any walking paths of anything that might make someone trip, such as rocks or tools. Regularly check to see if handrails are loose or broken. Make sure that both sides of any steps have handrails. Any raised decks and porches should have guardrails on the edges. Have any leaves, snow, or ice cleared regularly. Use sand or salt on walking paths during winter. Clean up any spills in your garage right away. This includes oil or grease spills. What can I do in the bathroom? Use night lights. Install grab  bars by the toilet and in the tub and shower. Do not use towel bars as grab bars. Use non-skid mats or decals in the tub or shower. If you need to sit down in the shower, use a plastic, non-slip stool. Keep the floor dry. Clean up any water that spills on the floor as soon as it happens. Remove soap buildup in the tub or shower regularly. Attach bath mats securely with double-sided non-slip rug tape. Do not have throw rugs and other things on the floor that can make you trip. What can I do in the bedroom? Use night lights. Make sure that you have a light by your bed that is easy to reach. Do not use any sheets or blankets that are too big for your bed. They should not hang down onto the floor. Have a firm chair that has side arms. You can use this for support while you get dressed. Do not have throw rugs and other things on the floor that can make you trip. What can I do in the kitchen? Clean up any spills right away. Avoid walking on wet  floors. Keep items that you use a lot in easy-to-reach places. If you need to reach something above you, use a strong step stool that has a grab bar. Keep electrical cords out of the way. Do not use floor polish or wax that makes floors slippery. If you must use wax, use non-skid floor wax. Do not have throw rugs and other things on the floor that can make you trip. What can I do with my stairs? Do not leave any items on the stairs. Make sure that there are handrails on both sides of the stairs and use them. Fix handrails that are broken or loose. Make sure that handrails are as long as the stairways. Check any carpeting to make sure that it is firmly attached to the stairs. Fix any carpet that is loose or worn. Avoid having throw rugs at the top or bottom of the stairs. If you do have throw rugs, attach them to the floor with carpet tape. Make sure that you have a light switch at the top of the stairs and the bottom of the stairs. If you do not have them,  ask someone to add them for you. What else can I do to help prevent falls? Wear shoes that: Do not have high heels. Have rubber bottoms. Are comfortable and fit you well. Are closed at the toe. Do not wear sandals. If you use a stepladder: Make sure that it is fully opened. Do not climb a closed stepladder. Make sure that both sides of the stepladder are locked into place. Ask someone to hold it for you, if possible. Clearly mark and make sure that you can see: Any grab bars or handrails. First and last steps. Where the edge of each step is. Use tools that help you move around (mobility aids) if they are needed. These include: Canes. Walkers. Scooters. Crutches. Turn on the lights when you go into a dark area. Replace any light bulbs as soon as they burn out. Set up your furniture so you have a clear path. Avoid moving your furniture around. If any of your floors are uneven, fix them. If there are any pets around you, be aware of where they are. Review your medicines with your doctor. Some medicines can make you feel dizzy. This can increase your chance of falling. Ask your doctor what other things that you can do to help prevent falls. This information is not intended to replace advice given to you by your health care provider. Make sure you discuss any questions you have with your health care provider. Document Released: 06/19/2009 Document Revised: 01/29/2016 Document Reviewed: 09/27/2014 Elsevier Interactive Patient Education  2017 [employer].

## 2023-02-02 ENCOUNTER — Other Ambulatory Visit (HOSPITAL_COMMUNITY): Payer: Self-pay | Admitting: Cardiology

## 2023-02-02 ENCOUNTER — Other Ambulatory Visit: Payer: Self-pay | Admitting: Student

## 2023-02-02 DIAGNOSIS — R160 Hepatomegaly, not elsewhere classified: Secondary | ICD-10-CM

## 2023-02-02 DIAGNOSIS — Z01812 Encounter for preprocedural laboratory examination: Secondary | ICD-10-CM

## 2023-02-03 ENCOUNTER — Other Ambulatory Visit: Payer: Self-pay

## 2023-02-03 ENCOUNTER — Ambulatory Visit
Admission: RE | Admit: 2023-02-03 | Discharge: 2023-02-03 | Disposition: A | Discharge status: Home / Self Care | Payer: Medicare Other | Source: Ambulatory Visit | Attending: Pulmonary Disease | Admitting: Pulmonary Disease

## 2023-02-03 DIAGNOSIS — R160 Hepatomegaly, not elsewhere classified: Secondary | ICD-10-CM

## 2023-02-03 DIAGNOSIS — Z01812 Encounter for preprocedural laboratory examination: Secondary | ICD-10-CM

## 2023-02-03 DIAGNOSIS — C787 Secondary malignant neoplasm of liver and intrahepatic bile duct: Secondary | ICD-10-CM | POA: Insufficient documentation

## 2023-02-03 DIAGNOSIS — R0902 Hypoxemia: Secondary | ICD-10-CM | POA: Insufficient documentation

## 2023-02-03 DIAGNOSIS — I272 Pulmonary hypertension, unspecified: Secondary | ICD-10-CM | POA: Diagnosis not present

## 2023-02-03 DIAGNOSIS — R918 Other nonspecific abnormal finding of lung field: Secondary | ICD-10-CM

## 2023-02-03 DIAGNOSIS — K769 Liver disease, unspecified: Secondary | ICD-10-CM | POA: Diagnosis present

## 2023-02-03 LAB — COMPREHENSIVE METABOLIC PANEL
ALT: 22 U/L (ref 0–44)
AST: 33 U/L (ref 15–41)
Albumin: 3.8 g/dL (ref 3.5–5.0)
Alkaline Phosphatase: 55 U/L (ref 38–126)
Anion gap: 13 (ref 5–15)
BUN: 48 mg/dL — ABNORMAL HIGH (ref 8–23)
CO2: 23 mmol/L (ref 22–32)
Calcium: 9.6 mg/dL (ref 8.9–10.3)
Chloride: 98 mmol/L (ref 98–111)
Creatinine, Ser: 1.54 mg/dL — ABNORMAL HIGH (ref 0.44–1.00)
GFR, Estimated: 33 mL/min — ABNORMAL LOW (ref >60)
Glucose, Bld: 168 mg/dL — ABNORMAL HIGH (ref 70–99)
Potassium: 5 mmol/L (ref 3.5–5.1)
Sodium: 134 mmol/L — ABNORMAL LOW (ref 135–145)
Total Bilirubin: 0.4 mg/dL (ref 0.3–1.2)
Total Protein: 7.3 g/dL (ref 6.5–8.1)

## 2023-02-03 LAB — CBC
HCT: 35.3 % — ABNORMAL LOW (ref 36.0–46.0)
Hemoglobin: 10.4 g/dL — ABNORMAL LOW (ref 12.0–15.0)
MCH: 24.4 pg — ABNORMAL LOW (ref 26.0–34.0)
MCHC: 29.5 g/dL — ABNORMAL LOW (ref 30.0–36.0)
MCV: 82.9 fL (ref 80.0–100.0)
Platelets: 364 10*3/uL (ref 150–400)
RBC: 4.26 MIL/uL (ref 3.87–5.11)
RDW: 18.1 % — ABNORMAL HIGH (ref 11.5–15.5)
WBC: 5.7 10*3/uL (ref 4.0–10.5)
nRBC: 0 % (ref 0.0–0.2)

## 2023-02-03 LAB — PROTIME-INR
INR: 1.1 (ref 0.8–1.2)
Prothrombin Time: 13.9 seconds (ref 11.4–15.2)

## 2023-02-03 LAB — GLUCOSE, CAPILLARY: Glucose-Capillary: 168 mg/dL — ABNORMAL HIGH (ref 70–99)

## 2023-02-03 MED ORDER — LIDOCAINE HCL (PF) 1 % IJ SOLN
10.0000 mL | Freq: Once | INTRAMUSCULAR | Status: AC
  Administered 2023-02-03: 10 mL via INTRADERMAL
  Filled 2023-02-03: qty 10

## 2023-02-03 MED ORDER — SODIUM CHLORIDE 0.9 % IV SOLN: INTRAVENOUS | Status: DC

## 2023-02-03 NOTE — Progress Notes (Signed)
PT transported to procedure. Dr. Aundria Rud directed to [place] to see pt .

## 2023-02-03 NOTE — H&P (Signed)
Chief Complaint: Patient was seen in consultation today for Liver lesion biopsy at the request of Gonzalez,Carmen L  Referring Physician(s): Gonzalez,Carmen L  Supervising Physician: Pernell Dupre  Patient Status: [facility] - Out-pt  History of Present Illness: Brianna Adams is a 82 y.o. female    FULL Code status per pt and family  Known Lung Cancer Hx COPD: chronic respiratory failure Hx PE-- on Eliquis-- LD 4 days ago Follow up with Oncology- Lung Cancer surveillance revealing new liver lesions PET 01/24/23:  IMPRESSION: 1. 3 cm right upper lobe lung mass is hypermetabolic and consistent with primary lung neoplasm. 2. Adjacent right paratracheal nodal disease. 3. Multiple hypermetabolic hepatic metastatic lesions. 4. Hypermetabolic right sixth posterior rib lesion suspicious for metastatic disease. 5. 10 mm right lower lateral breast lesion is hypermetabolic and likely a small focus of breast cancer.  Scheduled now for liver lesion biopsy per Dr Jayme Cloud    Past Medical History:  Diagnosis Date   CHF (congestive heart failure) (HCC)    COPD (chronic obstructive pulmonary disease) (HCC)    Diabetes (HCC)    type 2   Diverticulitis    Dyspnea    On Oxygen 3L via [place]    GERD (gastroesophageal reflux disease)    High cholesterol    Lung cancer (HCC)    Mild non proliferative diabetic retinopathy (HCC) 08/05/2021   PE (pulmonary thromboembolism) (HCC) 02/29/2020   Pulmonary hypertension (HCC)    Uterine cancer [facility])     Past Surgical History:  Procedure Laterality Date   ABDOMINAL HYSTERECTOMY     cataract surgery  2012 and 2017   COLONOSCOPY     FIBEROPTIC BRONCHOSCOPY     FLEXIBLE SIGMOIDOSCOPY N/A 08/13/2021   Procedure: FLEXIBLE SIGMOIDOSCOPY;  Surgeon: Imogene Burn, MD;  Location: [facility];  Service: Gastroenterology;  Laterality: N/A;   GANGLION CYST EXCISION Left 09/11/2020   Procedure: EXCISION VOLAR RADIAL GANGLION OF LEFT WRIST;  Surgeon:  Cindee Salt, MD;  Location: [facility] OR;  Service: Orthopedics;  Laterality: Left;  AXILLARY BLOCK   resection of lung cancer  2018   RIGHT HEART CATH N/A 10/20/2021   Procedure: RIGHT HEART CATH;  Surgeon: Marykay Lex, MD;  Location: [facility] INVASIVE CV LAB;  Service: Cardiovascular;  Laterality: N/A;   RIGHT HEART CATH N/A 03/18/2022   Procedure: RIGHT HEART CATH;  Surgeon: Laurey Morale, MD;  Location: [facility] INVASIVE CV LAB;  Service: Cardiovascular;  Laterality: N/A;   RIGHT HEART CATH N/A 06/04/2022   Procedure: RIGHT HEART CATH;  Surgeon: Laurey Morale, MD;  Location: [facility] INVASIVE CV LAB;  Service: Cardiovascular;  Laterality: N/A;   right lower lobe non-anatomocal lung resection wedge     right thorascoscopy      Allergies: Ace inhibitors, Penicillin g, Zestril [lisinopril], Cortisone, Hydrocortisone, Lipitor [atorvastatin], Mobic [meloxicam], and Pravachol [pravastatin]  Medications: Prior to Admission medications   Medication Sig Start Date End Date Taking? Authorizing Provider  albuterol (VENTOLIN HFA) 108 (90 Base) MCG/ACT inhaler INHALE 2 PUFFS BY MOUTH EVERY 6 HOURS AS NEEDED FOR WHEEZE OR SHORTNESS OF BREATH 12/24/22  Yes Salena Saner, MD  beta carotene w/minerals (OCUVITE) tablet Take 1 tablet by mouth in the morning.   Yes [provider]  Budeson-Glycopyrrol-Formoterol (BREZTRI AEROSPHERE) 160-9-4.8 MCG/ACT AERO Inhale 2 puffs into the lungs in the morning and at bedtime. 12/24/22  Yes Salena Saner, MD  CALCIUM PO Take 1,200 mg by mouth in the morning.   Yes [provider]  Cinnamon 500 MG TABS Take 1,000 mg by mouth at bedtime.   Yes [provider]  ferrous sulfate 325 (65 FE) MG EC tablet Take 1 tablet (325 mg total) by mouth 2 (two) times daily with a meal. 10/20/22  Yes Rickard Patience, MD  JARDIANCE 10 MG TABS tablet TAKE ONE TABLET BY MOUTH EVERY DAY FOR BREAKFAST 01/25/23  Yes Arnette Felts, [name]  KLOR-CON M20 20 MEQ tablet TAKE 1 TABLET BY MOUTH  EVERY DAY 09/27/22  Yes Laurey Morale, MD  metFORMIN (GLUCOPHAGE) 1000 MG tablet TAKE 1 TABLET (1,000 MG TOTAL) BY MOUTH TWICE A DAY WITH FOOD 10/13/22  Yes Arnette Felts, [name]  Multiple Vitamins-Minerals (CENTRUM SILVER 50+WOMEN PO) Take 1 tablet by mouth in the morning.   Yes [provider]  omeprazole (PRILOSEC) 40 MG capsule TAKE 1 CAPSULE BY MOUTH EVERY DAY 11/16/22  Yes Arnette Felts, [name]  pravastatin (PRAVACHOL) 10 MG tablet TAKE 1 TABLET BY MOUTH EVERY DAY 10/29/22  Yes Arnette Felts, [name]  Pseudoephedrine-guaiFENesin [facility] D PO) Take 5 mLs by mouth at bedtime.   Yes [provider]  tadalafil, PAH, (ADCIRCA) 20 MG tablet Take 2 tablets (40 mg total) by mouth daily. 10/15/22  Yes Laurey Morale, MD  torsemide (DEMADEX) 20 MG tablet TAKE ONE AND ONE HALF (1/2) TABLET BY MOUTH DAILY 02/02/23  Yes Laurey Morale, MD  Treprostinil (TYVASO DPI MAINTENANCE KIT) 16 MCG POWD Inhale 16 mcg into the lungs in the morning, at noon, in the evening, and at bedtime. 01/04/23  Yes Laurey Morale, MD  vitamin C (ASCORBIC ACID) 500 MG tablet Take 500 mg by mouth every Tuesday, Thursday, Saturday, and Sunday. In the morning.   Yes [provider]  vitamin E 180 MG (400 UNITS) capsule Take 400 Units by mouth every Monday, Wednesday, and Friday. In the morning.   Yes [provider]  acetaminophen (TYLENOL) 500 MG tablet Take 1,000 mg by mouth every 6 (six) hours as needed for mild pain or fever. Patient not taking: Reported on 02/03/2023 01/30/17   [provider]  apixaban (ELIQUIS) 2.5 MG TABS tablet Take 1 tablet (2.5 mg total) by mouth 2 (two) times daily. 10/20/22   Rickard Patience, MD  Spacer/Aero-Holding Deretha Emory DEVI Use with inhaler 12/24/21   Allison Quarry, Ruby Cola, NP     Family History  Problem Relation Age of Onset   Hypertension Mother    Heart attack Father    Cancer Maternal Aunt        unknown type   Stomach cancer Neg Hx    Colon cancer Neg Hx     Esophageal cancer Neg Hx    Pancreatic cancer Neg Hx     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, broadcasting/film/video  Tobacco Use   Smoking status: Former    Packs/day: 1.00    Years: 58.00    Additional pack years: 0.00    Total pack years: 58.00    Types: Cigarettes    Quit date: 10/22/2015    Years since quitting: 7.2    Passive exposure: Past   Smokeless tobacco: Never   Tobacco comments:    congratulated  Vaping Use   Vaping Use: Never used  Substance and Sexual Activity   Alcohol use: Never   Drug use: Never   Sexual activity: Not Currently    Birth control/protection: Surgical    Comment: HYSTERECTOMY  Other Topics Concern   Not on file  Social History Narrative   Not on file   Social Determinants of Health   Financial Resource Strain: Low Risk  (01/27/2023)   Overall Financial Resource Strain (CARDIA)    Difficulty of Paying Living Expenses: Not hard at all  Food Insecurity: No Food Insecurity (01/27/2023)   Hunger Vital Sign    Worried About Running Out of Food in the Last Year: Never true    Ran Out of Food in the Last Year: Never true  Transportation Needs: No Transportation Needs (01/27/2023)   PRAPARE - Administrator, Civil Service (Medical): No    Lack of Transportation (Non-Medical): No  Physical Activity: Insufficiently Active (01/27/2023)   Exercise Vital Sign    Days of Exercise per Week: 2 days    Minutes of Exercise per Session: 60 min  Stress: No Stress Concern Present (01/27/2023)   [employer] of Occupational Health - Occupational Stress Questionnaire    Feeling of Stress : Not at all  Social Connections: Not on file    Review of Systems: A 12 point ROS discussed and pertinent  positives are indicated in the HPI above.  All other systems are negative.  Review of Systems  Constitutional:  Negative for activity change and fatigue.  Respiratory:  Positive for shortness of breath. Negative for wheezing.   Gastrointestinal:  Negative for abdominal pain.  Musculoskeletal:  Positive for gait problem.  Neurological:  Positive for weakness.  Psychiatric/Behavioral:  Negative for behavioral problems and confusion.     Vital Signs: BP (!) 100/59   Temp (!) 96.6 F (35.9 C) (Axillary)   Resp (!) 30   Ht 5\' 2"  (1.575 m)   Wt 107 lb (48.5 kg)   SpO2 (!) 80% Comment: Dr. Payton Mccallum Abd aware of decreased spo2 level and increased need for oxygen from baseline 4L . no new orders, pt denies worsening shortness of breath  BMI 19.57 kg/m   Advance Care Plan: The advanced care plan/surrogate decision maker was discussed at the time of visit and documented in the medical record.    Physical Exam Vitals reviewed.  HENT:     Mouth/Throat:     Mouth: Mucous membranes are moist.  Cardiovascular:     Rate and Rhythm: Normal rate and regular rhythm.  Pulmonary:     Effort: Respiratory distress present.     Comments: O2 sats in 70s-80s Musculoskeletal:        General: Normal range of motion.  Neurological:     Mental Status: She is alert and oriented to person, place, and time.  Psychiatric:        Behavior: Behavior normal.     Imaging: NM PET Image Restage (PS) Skull Base to Thigh (F-18 FDG)  Result Date: 01/26/2023 CLINICAL DATA:  Subsequent treatment strategy for previous lung cancers. New 3 cm lung mass on recent chest CT along with mediastinal adenopathy. EXAM: NUCLEAR MEDICINE PET SKULL BASE TO THIGH TECHNIQUE: 6.2 mCi F-18 FDG was injected intravenously. Full-ring PET imaging was performed from the skull base to thigh after the radiotracer. CT data was obtained and used for attenuation correction and anatomic localization. Fasting blood glucose: 150 mg/dl COMPARISON:   Multiple previous imaging studies. The  most recent chest CT is 01/12/2023. FINDINGS: Mediastinal blood pool activity: SUV max 1.88 Liver activity: SUV max NA NECK: No hypermetabolic lymph nodes in the neck. Incidental CT findings: None. CHEST: Central right upper lobe pulmonary mass measuring 3 x 2.4 cm is hypermetabolic with SUV max of 8.95 and consistent with primary lung neoplasm. Adjacent right paratracheal nodal disease has an SUV max of 9.29. No definite pulmonary nodules to suggest pulmonary metastatic disease. There is a small hypermetabolic nodule in the right lower lateral breast. This measures 10 mm and has an SUV max of 6.04 and is likely a small focus of breast cancer. No axillary or supraclavicular adenopathy. Incidental CT findings: Multinodular thyroid goiter is stable. No hypermetabolic thyroid lesions. Underlying severe chronic lung disease with emphysema and pulmonary scarring. ABDOMEN/PELVIS: Multiple hypermetabolic hepatic metastatic lesions. 19 mm segment 2 lesion has an SUV max of 7.74. Far laterally in segment 2 is a 19 mm lesion which has an SUV max of 7.92. Segment 3 lesion measures 2.7 cm and has an SUV max of 7.17. Segment 5 lesion measures 2.3 cm and has an SUV max of 5.11. Multiple other hypermetabolic hepatic metastasis. No adrenal gland lesions.  No abdominal or pelvic lymphadenopathy. Extensive bowel activity could be due to recently eating or taking insulin. Incidental CT findings: Advanced vascular disease. Diffuse colonic diverticulosis. SKELETON: Hypermetabolic focus in the right sixth posterior rib with SUV max of 3.72. No obvious lesion on the CT scan but suspicious for metastatic focus. Do not see any other definite bone lesions Incidental CT findings: Remote post treatment changes involving the left chest wall with previously destroyed left fifth lateral rib. IMPRESSION: 1. 3 cm right upper lobe lung mass is hypermetabolic and consistent with primary lung neoplasm. 2.  Adjacent right paratracheal nodal disease. 3. Multiple hypermetabolic hepatic metastatic lesions. 4. Hypermetabolic right sixth posterior rib lesion suspicious for metastatic disease. 5. 10 mm right lower lateral breast lesion is hypermetabolic and likely a small focus of breast cancer. Electronically Signed   By: Rudie Meyer M.D.   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 Additional history of SBRT to an upper lobe pulmonary nodule per prior radiology report. COPD. Restaging. * Tracking Code: BO * EXAM: CT CHEST WITHOUT CONTRAST TECHNIQUE: Multidetector CT imaging of the chest was performed following the standard protocol without IV contrast. RADIATION DOSE REDUCTION: This exam was performed according to the departmental dose-optimization program which includes automated exposure control, adjustment of the mA and/or kV according to patient size and/or use of iterative reconstruction technique. COMPARISON:  01/20/2022 chest CT. FINDINGS: Cardiovascular: Borderline mild cardiomegaly. No significant pericardial effusion/thickening. Left anterior descending and left circumflex coronary atherosclerosis. Atherosclerotic nonaneurysmal thoracic aorta. Normal caliber pulmonary arteries. Mediastinum/Nodes: Stable mildly enlarged heterogeneous thyroid gland without discrete thyroid nodules. This has been evaluated on previous imaging. (ref: J Am Coll Radiol. 2015 Feb;12(2): 143-50). Unremarkable esophagus. No axillary adenopathy. Newly enlarged 2.6 cm short axis diameter right paratracheal node (series 2/image 41). Newly enlarged 1.2 cm subcarinal node (series 2/image 70). No discrete hilar adenopathy on these noncontrast images. Lungs/Pleura: No pneumothorax. No pleural effusion. 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 Central right upper lobe solid 3.0 x 2.4 cm lung mass (series  3/image 45), previously 1.3 x 1.1 cm. Stable postsurgical changes from wedge resection in the right lower lobe with curvilinear parenchymal band and distortion along  the suture line. Stable sharply marginated bandlike consolidation in peripheral left upper lobe with associated mild volume loss and distortion, compatible with radiation fibrosis. No additional significant pulmonary nodules. Upper abdomen: Small hiatal hernia. Several (at least 5) new hypodense liver masses scattered throughout the liver, with representative 2.5 cm inferior left liver mass (series 2/image 152), representative superior left liver 2.9 cm mass (series 2/image 130) and representative peripheral right liver 1.9 cm mass (series 2/image 151). Left colonic diverticulosis. Musculoskeletal: No aggressive appearing focal osseous lesions. Chronic incompletely healed lateral left fifth rib fracture. Mild thoracic spondylosis. IMPRESSION: 1. Central right upper lobe solid 3.0 cm lung mass, compatible with malignancy, favoring a metachronous primary bronchogenic malignancy. 2. New right paratracheal and subcarinal lymphadenopathy, suspicious for nodal metastases. 3. Several (at least 5) new hypodense liver masses scattered throughout the liver, suspicious for liver metastases. 4. Suggest PET-CT for further characterization. 5. Stable postsurgical changes in the right lower lobe and stable postradiation change in the left upper lobe, with no evidence of local tumor recurrence in these locations. 6. Borderline mild cardiomegaly. Two-vessel coronary atherosclerosis. 7. Small hiatal hernia. 8. Left colonic diverticulosis. 9. Aortic Atherosclerosis (ICD10-I70.0) and Emphysema (ICD10-J43.9). These results will be called to the ordering clinician or representative by the Radiologist Assistant, and communication documented in the PACS or [employer]. Electronically Signed   By: Delbert Phenix M.D.   On: 01/16/2023 13:43    Labs:  CBC: Recent Labs     09/19/22 1132 10/20/22 0938 11/10/22 1527 02/03/23 0841  WBC 5.2 4.4 7.4 5.7  HGB 13.6 13.2 11.0* 10.4*  HCT 42.6 42.1 33.6* 35.3*  PLT 319 190 303 364    COAGS: Recent Labs    09/19/22 1215  INR 1.1    BMP: Recent Labs    10/25/22 1214 11/10/22 1527 01/04/23 1122 02/03/23 0841  NA 135 136 132* 134*  K 4.0 3.7 4.2 5.0  CL 97* 97* 96* 98  CO2 24 25 23 23   GLUCOSE 139* 120* 130* 168*  BUN 35* 32* 39* 48*  CALCIUM 9.6 9.6 9.8 9.6  CREATININE 1.32* 1.40* 1.53* 1.54*  GFRNONAA 40* 38* 34* 33*    LIVER FUNCTION TESTS: Recent Labs    06/03/22 0024 09/19/22 1132 10/20/22 0938 02/03/23 0841  BILITOT 0.6 0.6 0.4 0.4  AST 26 24 22  33  ALT 51* 29 20 22   ALKPHOS 52 50 58 55  PROT 6.5 7.4 7.7 7.3  ALBUMIN 3.3* 3.8 4.2 3.8    TUMOR MARKERS: No results for input(s): "AFPTM", "CEA", "CA199", "CHROMGRNA" in the last 8760 hours.  Assessment and Plan:  Hx Lung cancer Remote Hx Uterine cancer New liver lesions on imaging per Lung Cancer surveillance Scheduled now for liver lesion biopsy Risks and benefits of liver lesion biopsy was discussed with the patient and/or patient's family including, but not limited to bleeding, infection, damage to adjacent structures or low yield requiring additional tests.  All of the questions were answered and there is agreement to proceed.  Consent signed and in chart.    Thank you for this interesting consult.  I greatly enjoyed meeting Brianna Adams and look forward to participating in their care.  A copy of this report was sent to the requesting provider on this date.  Electronically Signed: Robet Leu, PA-C 02/03/2023, 9:27 AM   I spent a total of  30 Minutes   in face to face in clinical consultation, greater than 50% of which was counseling/coordinating care for liver lesion  biopsy

## 2023-02-03 NOTE — Progress Notes (Signed)
Brief Progress Note  Asked to evaluate Ms. Mcconaghy for hypoxia. Her oxygen saturation was lower than her baseline of 88-92% on 4 liters of nasal cannula. Patient was asymptomatic at the time of my evaluations (prior and after her US biopsy). She has no increased shortness of breath or other respiratory symptoms. 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 Patient reported not having had her medications today. Following administration of inhaled Tyvaso, oxygen saturation improved back to baseline. Patient ok for discharge home at this moment.  Raechel Chute, MD [facility] 02/03/2023 1:00 PM

## 2023-02-03 NOTE — Progress Notes (Addendum)
Dr. Aundria Rud back by to see pt. Verbal orders for O2 at 6L and for pt to take a dose of her home med Tyvaso. Per Dr. Aundria Rud, okay for pt to go home with o2 sats in upper 80's with 5-6L [place]  Tyvaso given to pt by her daughter @ 1225  Update: oxygen sats improved, pt cleared to be d/c by both Dr. Aundria Rud and Dr. Juliette Alcide. PT stable at discharge

## 2023-02-03 NOTE — Progress Notes (Signed)
PA Pam at bedside speaking with pt and her daughter

## 2023-02-03 NOTE — Procedures (Signed)
Interventional Radiology Procedure Note  Date of Procedure: 02/03/2023  Procedure: US liver biopsy   Findings:  1. US liver biopsy focal lesion left hepatic lobe 18ga cores x3   Complications: No immediate complications noted.   Estimated Blood Loss: minimal  Follow-up and Recommendations: 1. Bedrest 2 hours    Olive Bass, MD  Vascular & Interventional Radiology  02/03/2023 10:39 AM

## 2023-02-08 LAB — SURGICAL PATHOLOGY

## 2023-02-12 IMAGING — DX DG CHEST 1V PORT
1 series · 1 of 1 positions shown · non-contrast
Comparison: 12/09/20

CLINICAL DATA: Shortness of breath.  History of lung cancer.

EXAM:
PORTABLE CHEST 1 VIEW

[chest ap]
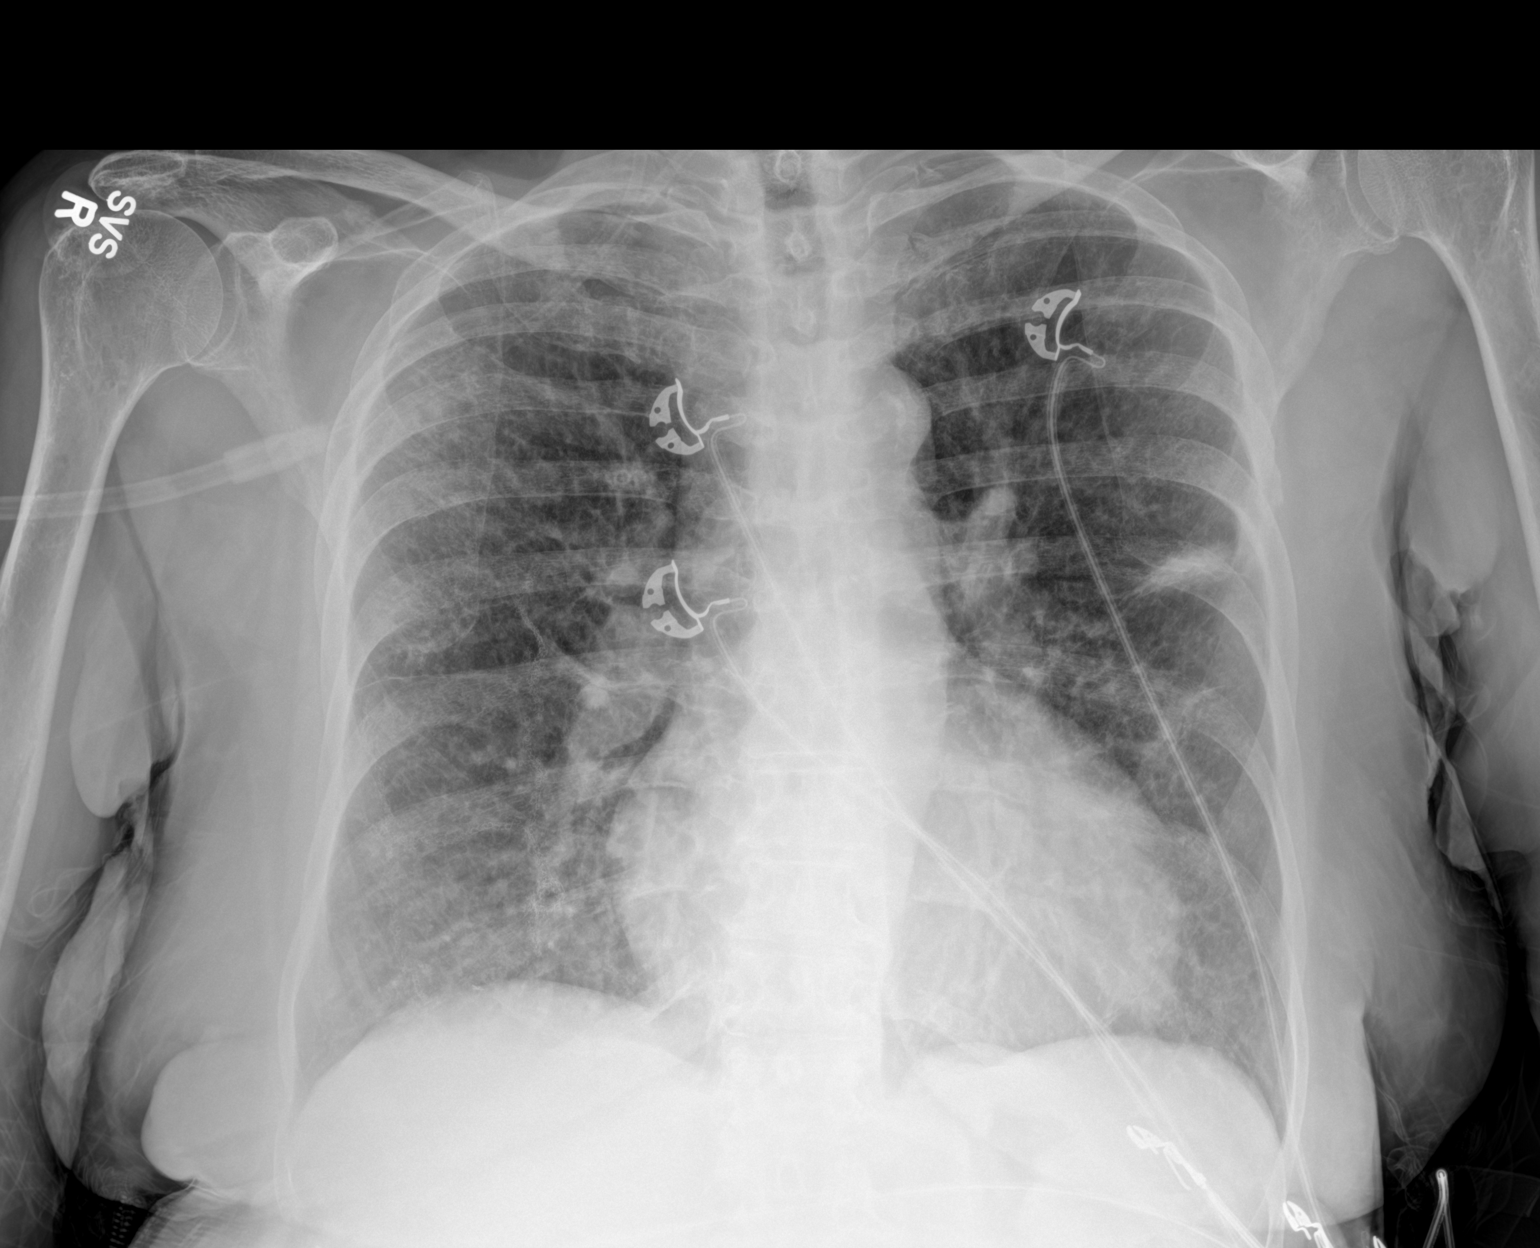

[1 of 1 positions shown; findings below may reference images not displayed]

FINDINGS: Stable cardiomediastinal contours. Diffuse, chronic reticular
interstitial opacities are identified throughout both lungs
compatible with chronic lung disease. No pleural effusion or edema.
Asymmetric increased opacity within the right upper lobe may reflect
superimposed pneumonia. Treated tumor within the left upper lobe
appears unchanged from previous exam.
IMPRESSION: Chronic lung disease with asymmetric increased opacity within the
right upper lobe may reflect superimposed pneumonia.

## 2023-02-14 ENCOUNTER — Other Ambulatory Visit: Payer: Medicare Other

## 2023-02-14 DIAGNOSIS — Z515 Encounter for palliative care: Secondary | ICD-10-CM

## 2023-02-14 NOTE — Progress Notes (Signed)
COMMUNITY PALLIATIVE CARE SW NOTE  PATIENT NAME: Brianna Adams DOB: 30-Apr-1941 MRN: [id number]  PRIMARY CARE PROVIDER: Arnette Felts, [name]  RESPONSIBLE PARTY:  Acct ID - Guarantor Home Phone Work Phone Relationship Acct Type  [id number] Trudie Reed* [phone]  Self P/F     [address], [place], [place] [address]     PLAN OF CARE and INTERVENTIONS:              Palliative care encounter: SW met with patient for f/u home visit.  Liver mass: Prior to visit patients daughter called SW and shared that she and patient meet with pulmonologist recently to review biopsy results and it was shared that patient has liver cancer. Daughter stated that it appears patient receive the news well in the moment but she is unsure how patient is truly processing this new dx. Today, during PC home visit, patient shared that she was not aware that cancer had been confirmed and she was under the impression that they would be going to another f/u appt to receive the official results of the biopsy. Patient share based on the upcoming discussion with oncology she will think about if she would want to aggressively treat the cancer or not. SW provider supportive and active listening and assured patient that PC will support what ever her decision/choice will be. Patient follows up with Dr. Cathie Hoops 6/18.   Appetite: fair-poor  patient state she has not been drinking supplements nor has she weighed herself.     COPD (emphysema):  Continues on oxygen at 4 L via [place].  Using inhalers as directed.  Reporting ongoing shortness of breath with exertion. Feels fatigue is persistently declining.     Social: Patient endorses that she has not gone to the [employer] with her daughter in about 2 weeks. No lightheadedness or syncope, no chest pain., dizziness or headaches reported. Patient share that she just wants to sleep all the time.      SOCIAL HX:  Social History   Tobacco Use   Smoking status: Former    Packs/day: 1.00    Years:  58.00    Additional pack years: 0.00    Total pack years: 58.00    Types: Cigarettes    Quit date: 10/22/2015    Years since quitting: 7.3    Passive exposure: Past   Smokeless tobacco: Never   Tobacco comments:    congratulated  Substance Use Topics   Alcohol use: Never   Time spent: 30 min      [place], [place]

## 2023-02-15 ENCOUNTER — Ambulatory Visit: Payer: Medicare Other

## 2023-02-16 ENCOUNTER — Telehealth: Payer: Self-pay | Admitting: Pulmonary Disease

## 2023-02-16 NOTE — Telephone Encounter (Signed)
Pt was returning call of Dr. Jayme Cloud

## 2023-02-16 NOTE — Telephone Encounter (Signed)
Was able to reach out to daughter (DPOA) and discuss biopsy findings in detail. Specifically, new tumor is different from the tumor in 2018. Patient has Oncology F/U with Dr. Cathie Hoops. Nothing further needed at this point.

## 2023-02-22 ENCOUNTER — Inpatient Hospital Stay: Payer: Medicare Other

## 2023-02-22 ENCOUNTER — Ambulatory Visit: Payer: Medicare Other | Admitting: Oncology

## 2023-02-22 ENCOUNTER — Telehealth: Payer: Self-pay | Admitting: Pulmonary Disease

## 2023-02-22 ENCOUNTER — Encounter: Payer: Self-pay | Admitting: Pulmonary Disease

## 2023-02-22 ENCOUNTER — Encounter: Payer: Self-pay | Admitting: *Deleted

## 2023-02-22 ENCOUNTER — Inpatient Hospital Stay: Payer: Medicare Other | Attending: Oncology | Admitting: Oncology

## 2023-02-22 ENCOUNTER — Encounter: Payer: Self-pay | Admitting: Oncology

## 2023-02-22 VITALS — BP 95/54 | HR 93 | Temp 97.5°F | Resp 18 | Wt 109.2 lb

## 2023-02-22 DIAGNOSIS — Z7901 Long term (current) use of anticoagulants: Secondary | ICD-10-CM | POA: Insufficient documentation

## 2023-02-22 DIAGNOSIS — C787 Secondary malignant neoplasm of liver and intrahepatic bile duct: Secondary | ICD-10-CM | POA: Insufficient documentation

## 2023-02-22 DIAGNOSIS — Z86711 Personal history of pulmonary embolism: Secondary | ICD-10-CM | POA: Insufficient documentation

## 2023-02-22 DIAGNOSIS — C3490 Malignant neoplasm of unspecified part of unspecified bronchus or lung: Secondary | ICD-10-CM

## 2023-02-22 DIAGNOSIS — Z90710 Acquired absence of both cervix and uterus: Secondary | ICD-10-CM | POA: Diagnosis not present

## 2023-02-22 DIAGNOSIS — I2609 Other pulmonary embolism with acute cor pulmonale: Secondary | ICD-10-CM

## 2023-02-22 DIAGNOSIS — Z7189 Other specified counseling: Secondary | ICD-10-CM | POA: Diagnosis not present

## 2023-02-22 DIAGNOSIS — Z87891 Personal history of nicotine dependence: Secondary | ICD-10-CM | POA: Diagnosis not present

## 2023-02-22 DIAGNOSIS — C3491 Malignant neoplasm of unspecified part of right bronchus or lung: Secondary | ICD-10-CM | POA: Insufficient documentation

## 2023-02-22 DIAGNOSIS — J9611 Chronic respiratory failure with hypoxia: Secondary | ICD-10-CM | POA: Diagnosis not present

## 2023-02-22 NOTE — Progress Notes (Signed)
Hematology/Oncology Progress note [phone] [phone]     CHIEF COMPLAINTS/REASON FOR VISIT:   history of pulmonary embolism.  Chronic anticoagulation, iron deficiency.  ASSESSMENT & PLAN:   Cancer Staging  Primary lung adenocarcinoma, right [facility]) Staging form: Lung, AJCC 8th Edition - Clinical stage from 02/22/2023: Stage IV (cT1c, cN2, pM1) - Signed by Rickard Patience, MD on 02/22/2023   Squamous cell carcinoma lung, unspecified laterality (HCC) History of lung cancer status post en bloc wedge resection at [facility] with Dr.Whitney Ronna Polio in May 2018   Primary lung adenocarcinoma, right (HCC) CT, PET scan and liver lesion biopsy results were reviewed and discussed patient. 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 Prognosis is poor. Patient has multiple comorbidities, poor performance status, I do not think she is a candidate for aggressive chemotherapy. Immunotherapy may be a treatment option for her if PD-L1 is above 50%.  If PD-L1 is less than 50%, and if she strongly desires to try some antineoplastic treatment, I think is reasonable to try monotherapy Keytruda.  Rationale and potential side effects were reviewed and discussed with patient. I will also send off molecular testing to see if there is any palpable mutations.  If he is interested in treatment, I will also obtain MRI brain. I also discussed with patient about option of not to proceed with any treatment, consider hospice/comfort care.  Patient is undecided would like to see how her molecular testing result is and then decide.   Pulmonary embolism (HCC) #History of pulmonary embolism, possible provoked event, however patient has multiple other contributing factors including history of cancer, sedentary lifestyle, limited mobility due to chronic respiratory failure. Continue Eliquis 2.5mg  BID \  Chronic respiratory failure with hypoxia  (HCC) Continue follow-up with pulmonology.  No orders of the defined types were placed in this encounter.  Follow up in 6 months.  All questions were answered. The patient knows to call the clinic with any problems, questions or concerns.  Rickard Patience, MD, PhD [facility] Hematology Oncology 02/22/2023   HISTORY OF PRESENTING ILLNESS:   Brianna Adams is a  82 y.o.  female with PMH listed below was seen in consultation at the request of  Arnette Felts, [name]  for evaluation of history of pulmonary embolism.  Patient has been on oxygen via nasal cannula at home due to pulmonary hypertension.  She also has an early stage lung cancer status post a right lower lobe resection. 02/29/2020 patient presented emergency room for evaluation of shortness of breath.  Patient was found to have segmental PE of the right lower lobe with evidence of right heart strain.  And new area of the consolidation in the left lower lobe concerning for pneumonia.  Lower extremity ultrasound was negative for DVT.   Patient also was treated for pneumonia, acute decompensated CHF.Marland Kitchen  Patient was placed on heparin and discharged on Eliquis.  Patient takes Eliquis 5 mg twice daily since then on 05/08/2021, when she developed rectal bleeding.  Eliquis was decreased to 2.5 mg twice daily.  Patient currently is on nasal cannula oxygen 4 L.  Patient was accompanied by her daughter today.  Daughter reports history of long distance car trip-6 hours  prior to patient's PE diagnosis.Daughter is not sure about exactly the timeframe of immobilization event.  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  Patient follows up with Dr. Jayme Cloud for surveillance, lung emphysema, chronic respiratory failure.  Patient denies any additional episodes of rectal bleeding since decrease to Eliquis 2.5 mg twice daily. Per patient  and her daughter, patient has been quite active despite chronic respiratory failure.  Per daughter, patient walks faster than her. Patient has. been referred to establish care with gastroenterology.  08/13/2021, flexible sigmoidoscopy showed diverticulosis.  Nonbleeding internal hemorrhoids.  No specimen was collected.  INTERVAL HISTORY Brianna Adams is a 82 y.o. female who has above history reviewed by me today presents for follow up visit for  history of pulmonary embolism.  Chronic anticoagulation, iron deficiency, and new diagnosis of lung cancer. Patient was accompanied by his daughter. Chronic shortness of breath, patient is not nasal cannula oxygen.   Review of Systems  Constitutional:  Positive for fatigue. Negative for appetite change, chills and fever.  HENT:   Negative for hearing loss and voice change.   Eyes:  Negative for eye problems.  Respiratory:  Positive for shortness of breath. Negative for chest tightness, cough and hemoptysis.   Cardiovascular:  Negative for chest pain.  Gastrointestinal:  Negative for abdominal distention, abdominal pain and blood in stool.  Endocrine: Negative for hot flashes.  Genitourinary:  Negative for difficulty urinating and frequency.   Musculoskeletal:  Negative for arthralgias.  Skin:  Negative for itching and rash.  Neurological:  Negative for extremity weakness.  Hematological:  Negative for adenopathy.  Psychiatric/Behavioral:  Negative for confusion.     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   PE (pulmonary thromboembolism) (HCC) 02/29/2020   Pulmonary hypertension (HCC)    Uterine cancer  (HCC)     SURGICAL HISTORY: Past Surgical History:  Procedure Laterality Date   ABDOMINAL HYSTERECTOMY     cataract surgery  2012 and 2017   COLONOSCOPY     FIBEROPTIC BRONCHOSCOPY     FLEXIBLE SIGMOIDOSCOPY N/A 08/13/2021   Procedure: FLEXIBLE SIGMOIDOSCOPY;  Surgeon: Imogene Burn, MD;  Location: [facility];  Service: Gastroenterology;  Laterality: N/A;   GANGLION CYST EXCISION Left 09/11/2020   Procedure: EXCISION VOLAR RADIAL GANGLION OF LEFT WRIST;  Surgeon: Cindee Salt, MD;  Location: [facility] OR;  Service: Orthopedics;  Laterality: Left;  AXILLARY BLOCK   resection of lung cancer  2018   RIGHT HEART CATH N/A 10/20/2021   Procedure: RIGHT HEART CATH;  Surgeon: Marykay Lex, MD;  Location: [facility] INVASIVE CV LAB;  Service: Cardiovascular;  Laterality: N/A;   RIGHT HEART CATH N/A 03/18/2022   Procedure: RIGHT HEART CATH;  Surgeon: Laurey Morale, MD;  Location: [facility] INVASIVE CV LAB;  Service: Cardiovascular;  Laterality: N/A;   RIGHT HEART CATH N/A 06/04/2022   Procedure: RIGHT HEART CATH;  Surgeon: Laurey Morale, MD;  Location: [facility] INVASIVE CV LAB;  Service: Cardiovascular;  Laterality: N/A;   right lower lobe non-anatomocal lung resection wedge     right thorascoscopy      SOCIAL HISTORY: Social History   Socioeconomic History   Marital status: Widowed    Spouse name:  Not on file   Number of children: 1   Years of education: Not on file   Highest education level: Master's degree (e.g., MA, MS, MEng, MEd, MSW, MBA)  Occupational History   Occupation: retired    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r-sr Runner, broadcasting/film/video  Tobacco Use   Smoking status: Former    Packs/day: 1.00    Years: 58.00    Additional pack years: 0.00    Total pack years: 58.00    Types: Cigarettes    Quit date: 10/22/2015    Years since quitting: 7.3    Passive exposure: Past   Smokeless tobacco: Never   Tobacco comments:    Data processing manager Use: Never used  Substance and Sexual Activity   Alcohol use:  Never   Drug use: Never   Sexual activity: Not Currently    Birth control/protection: Surgical    Comment: HYSTERECTOMY  Other Topics Concern   Not on file  Social History Narrative   Not on file   Social Determinants of Health   Financial Resource Strain: Low Risk  (01/27/2023)   Overall Financial Resource Strain (CARDIA)    Difficulty of Paying Living Expenses: Not hard at all  Food Insecurity: No Food Insecurity (01/27/2023)   Hunger Vital Sign    Worried About Running Out of Food in the Last Year: Never true    Ran Out of Food in the Last Year: Never true  Transportation Needs: No Transportation Needs (01/27/2023)   PRAPARE - Administrator, Civil Service (Medical): No    Lack of Transportation (Non-Medical): No  Physical Activity: Insufficiently Active (01/27/2023)   Exercise Vital Sign    Days of Exercise per Week: 2 days    Minutes of Exercise per Session: 60 min  Stress: No Stress Concern Present (01/27/2023)   [employer] of Occupational Health - Occupational Stress Questionnaire    Feeling of Stress : Not at all  Social Connections: Not on file  Intimate Partner Violence: Not At Risk (06/02/2022)   Humiliation, Afraid, Rape, and Kick questionnaire    Fear of Current or Ex-Partner: No    Emotionally Abused: No    Physically Abused: No    Sexually Abused: No    FAMILY HISTORY: Family History  Problem Relation Age of Onset   Hypertension Mother    Heart attack Father    Cancer Maternal Aunt        unknown type   Stomach cancer Neg Hx    Colon cancer Neg Hx    Esophageal cancer Neg Hx    Pancreatic cancer Neg Hx     ALLERGIES:  is allergic to ace inhibitors, penicillin g, zestril [lisinopril], cortisone, hydrocortisone, lipitor [atorvastatin], mobic [meloxicam], and pravachol [pravastatin].  MEDICATIONS:  Current Outpatient Medications  Medication Sig Dispense Refill   apixaban (ELIQUIS) 2.5 MG TABS tablet Take 1 tablet (2.5 mg total) by mouth  2 (two) times daily. 180 tablet 1   beta carotene w/minerals (OCUVITE) tablet Take 1 tablet by mouth in the morning.     Budeson-Glycopyrrol-Formoterol (BREZTRI AEROSPHERE) 160-9-4.8 MCG/ACT AERO Inhale 2 puffs into the lungs in the morning and at bedtime. 10.7 g 12   CALCIUM PO Take 1,200 mg by mouth in the morning.     Cinnamon 500 MG TABS Take 1,000 mg by mouth at bedtime.     ferrous sulfate 325 (65 FE) MG EC tablet Take 1 tablet (325 mg total) by mouth 2 (two) times daily with  a meal. 180 tablet 1   JARDIANCE 10 MG TABS tablet TAKE ONE TABLET BY MOUTH EVERY DAY FOR BREAKFAST 30 tablet 0   KLOR-CON M20 20 MEQ tablet TAKE 1 TABLET BY MOUTH EVERY DAY 90 tablet 1   metFORMIN (GLUCOPHAGE) 1000 MG tablet TAKE 1 TABLET (1,000 MG TOTAL) BY MOUTH TWICE A DAY WITH FOOD 180 tablet 1   Multiple Vitamins-Minerals (CENTRUM SILVER 50+WOMEN PO) Take 1 tablet by mouth in the morning.     omeprazole (PRILOSEC) 40 MG capsule TAKE 1 CAPSULE BY MOUTH EVERY DAY 90 capsule 1   pravastatin (PRAVACHOL) 10 MG tablet TAKE 1 TABLET BY MOUTH EVERY DAY 90 tablet 1   tadalafil, PAH, (ADCIRCA) 20 MG tablet Take 2 tablets (40 mg total) by mouth daily. 180 tablet 3   torsemide (DEMADEX) 20 MG tablet TAKE ONE AND ONE HALF (1/2) TABLET BY MOUTH DAILY 90 tablet 1   Treprostinil (TYVASO DPI MAINTENANCE KIT) 16 MCG POWD Inhale 16 mcg into the lungs in the morning, at noon, in the evening, and at bedtime. 112 each 11   vitamin C (ASCORBIC ACID) 500 MG tablet Take 500 mg by mouth every Tuesday, Thursday, Saturday, and Sunday. In the morning.     vitamin E 180 MG (400 UNITS) capsule Take 400 Units by mouth every Monday, Wednesday, and Friday. In the morning.     acetaminophen (TYLENOL) 500 MG tablet Take 1,000 mg by mouth every 6 (six) hours as needed for mild pain or fever. (Patient not taking: Reported on 02/03/2023)     albuterol (VENTOLIN HFA) 108 (90 Base) MCG/ACT inhaler INHALE 2 PUFFS BY MOUTH EVERY 6 HOURS AS NEEDED FOR WHEEZE  OR SHORTNESS OF BREATH (Patient not taking: Reported on 02/22/2023) 8.5 each 5   Pseudoephedrine-guaiFENesin (MUCINEX D PO) Take 5 mLs by mouth at bedtime. (Patient not taking: Reported on 02/22/2023)     Spacer/Aero-Holding Rudean Curt Use with inhaler (Patient not taking: Reported on 02/22/2023) 1 each 2   No current facility-administered medications for this visit.     PHYSICAL EXAMINATION: ECOG PERFORMANCE STATUS: 2 - Symptomatic, <50% confined to bed Vitals:   02/22/23 1342  BP: (!) 95/54  Pulse: 93  Resp: 18  Temp: (!) 97.5 F (36.4 C)   Filed Weights   02/22/23 1342  Weight: 109 lb 3.2 oz (49.5 kg)    Physical Exam Constitutional:      General: She is not in acute distress.    Comments: Patient sits in the wheelchair  HENT:     Head: Normocephalic and atraumatic.  Eyes:     General: No scleral icterus. Cardiovascular:     Rate and Rhythm: Normal rate and regular rhythm.     Heart sounds: Normal heart sounds.  Pulmonary:     Effort: Pulmonary effort is normal. No respiratory distress.     Breath sounds: No wheezing.     Comments: Decreased breath sound bilaterally.  Patient breathes comfortably via nasal cannula oxygen Abdominal:     General: Bowel sounds are normal. There is no distension.     Palpations: Abdomen is soft.  Musculoskeletal:        General: No deformity. Normal range of motion.     Cervical back: Normal range of motion and neck supple.  Skin:    General: Skin is warm and dry.     Findings: No erythema or rash.  Neurological:     Mental Status: She is alert and oriented to person, place, and time.  Mental status is at baseline.     Cranial Nerves: No cranial nerve deficit.     Coordination: Coordination normal.  Psychiatric:        Mood and Affect: Mood normal.     LABORATORY DATA:  I have reviewed the data as listed    Latest Ref Rng & Units 02/03/2023    8:41 AM 11/10/2022    3:27 PM 10/20/2022    9:38 AM  CBC  WBC 4.0 - 10.5 K/uL 5.7   7.4  4.4   Hemoglobin 12.0 - 15.0 g/dL 16.1  09.6  04.5   Hematocrit 36.0 - 46.0 % 35.3  33.6  42.1   Platelets 150 - 400 K/uL 364  303  190       Latest Ref Rng & Units 02/03/2023    8:41 AM 01/04/2023   11:22 AM 11/10/2022    3:27 PM  CMP  Glucose 70 - 99 mg/dL 409  811  914   BUN 8 - 23 mg/dL 48  39  32   Creatinine 0.44 - 1.00 mg/dL 7.82  9.56  2.13   Sodium 135 - 145 mmol/L 134  132  136   Potassium 3.5 - 5.1 mmol/L 5.0  4.2  3.7   Chloride 98 - 111 mmol/L 98  96  97   CO2 22 - 32 mmol/L 23  23  25    Calcium 8.9 - 10.3 mg/dL 9.6  9.8  9.6   Total Protein 6.5 - 8.1 g/dL 7.3     Total Bilirubin 0.3 - 1.2 mg/dL 0.4     Alkaline Phos 38 - 126 U/L 55     AST 15 - 41 U/L 33     ALT 0 - 44 U/L 22        Iron/TIBC/Ferritin/ %Sat    Component Value Date/Time   IRON 38 10/20/2022 0938   TIBC 455 (H) 10/20/2022 0938   FERRITIN 9 (L) 10/20/2022 0938   IRONPCTSAT 8 (L) 10/20/2022 0865       RADIOGRAPHIC STUDIES: I have personally reviewed the radiological images as listed and agreed with the findings in the report. US BIOPSY (LIVER)  Result Date: 02/03/2023 INDICATION: Liver Mets EXAM: Ultrasound-guided core needle biopsy of focal liver lesion MEDICATIONS: None. ANESTHESIA/SEDATION: Local analgesia COMPLICATIONS: None immediate. PROCEDURE: Informed written consent was obtained from the patient after a thorough discussion of the procedural risks, benefits and alternatives. All questions were addressed. Maximal Sterile Barrier Technique was utilized including caps, mask, sterile gowns, sterile gloves, sterile drape, hand hygiene and skin antiseptic. A timeout was performed prior to the initiation of the procedure. The patient was placed supine on the exam table. Ultrasound of the abdomen demonstrated numerous hypoechoic lesions scattered throughout the liver. An appropriate lesion in the left hepatic lobe was selected for biopsy. Skin entry site was marked, and the overlying skin was  prepped draped in the standard sterile fashion. Local analgesia was obtained with 1% lidocaine. Using ultrasound guidance, a 17 gauge introducer needle was advanced towards the selected focal lesion in the left hepatic lobe. Subsequently, core needle biopsy was performed of the focal lesion in the left hepatic lobe using an 18 gauge core biopsy device x3 total passes. Specimens were submitted in formalin to pathology for further handling. Limited postprocedure imaging demonstrated no hematoma. A clean dressing was placed after manual hemostasis. The patient tolerated the procedure well without immediate complication. IMPRESSION: Successful ultrasound-guided core needle biopsy of focal lesion in the left hepatic  lobe. Electronically Signed   By: Olive Bass M.D.   On: 02/03/2023 10:50   NM PET Image Restage (PS) Skull Base to Thigh (F-18 FDG)  Result Date: 01/26/2023 CLINICAL DATA:  Subsequent treatment strategy for previous lung cancers. New 3 cm lung mass on recent chest CT along with mediastinal adenopathy. EXAM: NUCLEAR MEDICINE PET SKULL BASE TO THIGH TECHNIQUE: 6.2 mCi F-18 FDG was injected intravenously. Full-ring PET imaging was performed from the skull base to thigh after the radiotracer. CT data was obtained and used for attenuation correction and anatomic localization. Fasting blood glucose: 150 mg/dl COMPARISON:  Multiple previous imaging studies. The most recent chest CT is 01/12/2023. FINDINGS: Mediastinal blood pool activity: SUV max 1.88 Liver activity: SUV max NA NECK: No hypermetabolic lymph nodes in the neck. Incidental CT findings: None. CHEST: Central right upper lobe pulmonary mass measuring 3 x 2.4 cm is hypermetabolic with SUV max of 8.95 and consistent with primary lung neoplasm. Adjacent right paratracheal nodal disease has an SUV max of 9.29. No definite pulmonary nodules to suggest pulmonary metastatic disease. There is a small hypermetabolic nodule in the right lower lateral  breast. This measures 10 mm and has an SUV max of 6.04 and is likely a small focus of breast cancer. No axillary or supraclavicular adenopathy. Incidental CT findings: Multinodular thyroid goiter is stable. No hypermetabolic thyroid lesions. Underlying severe chronic lung disease with emphysema and pulmonary scarring. ABDOMEN/PELVIS: Multiple hypermetabolic hepatic metastatic lesions. 19 mm segment 2 lesion has an SUV max of 7.74. Far laterally in segment 2 is a 19 mm lesion which has an SUV max of 7.92. Segment 3 lesion measures 2.7 cm and has an SUV max of 7.17. Segment 5 lesion measures 2.3 cm and has an SUV max of 5.11. Multiple other hypermetabolic hepatic metastasis. No adrenal gland lesions.  No abdominal or pelvic lymphadenopathy. Extensive bowel activity could be due to recently eating or taking insulin. Incidental CT findings: Advanced vascular disease. Diffuse colonic diverticulosis. SKELETON: Hypermetabolic focus in the right sixth posterior rib with SUV max of 3.72. No obvious lesion on the CT scan but suspicious for metastatic focus. Do not see any other definite bone lesions Incidental CT findings: Remote post treatment changes involving the left chest wall with previously destroyed left fifth lateral rib. IMPRESSION: 1. 3 cm right upper lobe lung mass is hypermetabolic and consistent with primary lung neoplasm. 2. Adjacent right paratracheal nodal disease. 3. Multiple hypermetabolic hepatic metastatic lesions. 4. Hypermetabolic right sixth posterior rib lesion suspicious for metastatic disease. 5. 10 mm right lower lateral breast lesion is hypermetabolic and likely a small focus of breast cancer. Electronically Signed   By: Rudie Meyer M.D.   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 Additional history of SBRT to an upper lobe pulmonary nodule per prior radiology  report. COPD. Restaging. * Tracking Code: BO * EXAM: CT CHEST WITHOUT CONTRAST TECHNIQUE: Multidetector CT imaging of the chest was performed following the standard protocol without IV contrast. RADIATION DOSE REDUCTION: This exam was performed according to the departmental dose-optimization program which includes automated exposure control, adjustment of the mA and/or kV according to patient size and/or use of iterative reconstruction technique. COMPARISON:  01/20/2022 chest CT. FINDINGS: Cardiovascular: Borderline mild cardiomegaly. No significant pericardial effusion/thickening. Left anterior descending and left circumflex coronary atherosclerosis. Atherosclerotic nonaneurysmal thoracic aorta. Normal caliber pulmonary arteries.  Mediastinum/Nodes: Stable mildly enlarged heterogeneous thyroid gland without discrete thyroid nodules. This has been evaluated on previous imaging. (ref: J Am Coll Radiol. 2015 Feb;12(2): 143-50). Unremarkable esophagus. No axillary adenopathy. Newly enlarged 2.6 cm short axis diameter right paratracheal node (series 2/image 41). Newly enlarged 1.2 cm subcarinal node (series 2/image 70). No discrete hilar adenopathy on these noncontrast images. Lungs/Pleura: No pneumothorax. No pleural effusion. 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 Central right upper lobe solid 3.0 x 2.4 cm lung mass (series 3/image 45), previously 1.3 x 1.1 cm. Stable postsurgical changes from wedge resection in the right lower lobe with curvilinear parenchymal band and distortion along the suture line. Stable sharply marginated bandlike consolidation in peripheral left upper lobe with associated mild volume loss and distortion, compatible with radiation fibrosis. No additional significant pulmonary nodules. Upper abdomen: Small hiatal hernia. Several (at least 5) new hypodense liver masses scattered throughout the liver, with representative 2.5 cm inferior left liver mass (series 2/image 152), representative  superior left liver 2.9 cm mass (series 2/image 130) and representative peripheral right liver 1.9 cm mass (series 2/image 151). Left colonic diverticulosis. Musculoskeletal: No aggressive appearing focal osseous lesions. Chronic incompletely healed lateral left fifth rib fracture. Mild thoracic spondylosis. IMPRESSION: 1. Central right upper lobe solid 3.0 cm lung mass, compatible with malignancy, favoring a metachronous primary bronchogenic malignancy. 2. New right paratracheal and subcarinal lymphadenopathy, suspicious for nodal metastases. 3. Several (at least 5) new hypodense liver masses scattered throughout the liver, suspicious for liver metastases. 4. Suggest PET-CT for further characterization. 5. Stable postsurgical changes in the right lower lobe and stable postradiation change in the left upper lobe, with no evidence of local tumor recurrence in these locations. 6. Borderline mild cardiomegaly. Two-vessel coronary atherosclerosis. 7. Small hiatal hernia. 8. Left colonic diverticulosis. 9. Aortic Atherosclerosis (ICD10-I70.0) and Emphysema (ICD10-J43.9). These results will be called to the ordering clinician or representative by the Radiologist Assistant, and communication documented in the PACS or [employer]. Electronically Signed   By: Delbert Phenix M.D.   On: 01/16/2023 13:43

## 2023-02-22 NOTE — Assessment & Plan Note (Signed)
Continue follow up with pulmonology

## 2023-02-22 NOTE — Telephone Encounter (Signed)
Called and spoke to patient's daughter, Benita(DPR). She is requesting call from Dr. Jayme Cloud directly to discuss oncology appointment.  Dr. Jayme Cloud, please advise. Contact number  [phone]

## 2023-02-22 NOTE — Assessment & Plan Note (Addendum)
CT, PET scan and liver lesion biopsy results were reviewed and discussed patient. 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 Prognosis is poor. Patient has multiple comorbidities, poor performance status, I do not think she is a candidate for aggressive chemotherapy. Immunotherapy may be a treatment option for her if PD-L1 is above 50%.  If PD-L1 is less than 50%, and if she strongly desires to try some antineoplastic treatment, I think is reasonable to try monotherapy Keytruda.  Rationale and potential side effects were reviewed and discussed with patient. I will also send off molecular testing to see if there is any palpable mutations.  If he is interested in treatment, I will also obtain MRI brain. I also discussed with patient about option of not to proceed with any treatment, consider hospice/comfort care.  Patient is undecided would like to see how her molecular testing result is and then decide.

## 2023-02-22 NOTE — Telephone Encounter (Signed)
Reached out to [facility] and discussed plans from oncology.

## 2023-02-22 NOTE — Assessment & Plan Note (Signed)
#  History of pulmonary embolism, possible provoked event, however patient has multiple other contributing factors including history of cancer, sedentary lifestyle, limited mobility due to chronic respiratory failure. Continue Eliquis 2.5mg  BID \

## 2023-02-22 NOTE — Telephone Encounter (Signed)
Brianna Adams daughter would like to speak to Dr. Jayme Cloud about oncology appointment. Brianna Adams phone number is [phone].

## 2023-02-22 NOTE — Assessment & Plan Note (Signed)
History of lung cancer status post en bloc wedge resection at [facility] with Dr.Whitney Ronna Polio in May 2018

## 2023-02-22 NOTE — Telephone Encounter (Signed)
Noted.  Will close encounter.  

## 2023-02-22 NOTE — Progress Notes (Signed)
Met with patient after follow up visit with Dr. Cathie Hoops. All questions answered during visit. Reviewed upcoming appts. Contact info given and instructed to call with any questions or needs. Pt verbalized understanding.

## 2023-02-23 ENCOUNTER — Telehealth: Payer: Self-pay

## 2023-02-23 NOTE — Telephone Encounter (Signed)
[facility] NGS and (xT & xR with PD-L1 22C3 IHC requested) on [id number], liver biopsy, collected 02/03/23. Liquid biopsy (xF) also requested. Blood collected on 02/22/23.   Financial application was submitted.

## 2023-03-01 ENCOUNTER — Other Ambulatory Visit: Payer: Self-pay | Admitting: Nurse Practitioner

## 2023-03-01 ENCOUNTER — Other Ambulatory Visit: Payer: Self-pay | Admitting: Oncology

## 2023-03-01 DIAGNOSIS — Z86711 Personal history of pulmonary embolism: Secondary | ICD-10-CM

## 2023-03-07 ENCOUNTER — Other Ambulatory Visit (HOSPITAL_COMMUNITY): Payer: Self-pay | Admitting: Cardiology

## 2023-03-08 ENCOUNTER — Ambulatory Visit (INDEPENDENT_AMBULATORY_CARE_PROVIDER_SITE_OTHER): Payer: Medicare Other | Admitting: Pulmonary Disease

## 2023-03-08 ENCOUNTER — Encounter: Payer: Self-pay | Admitting: Pulmonary Disease

## 2023-03-08 VITALS — BP 110/60 | HR 79 | Temp 97.6°F | Ht 62.0 in | Wt 109.4 lb

## 2023-03-08 DIAGNOSIS — J439 Emphysema, unspecified: Secondary | ICD-10-CM

## 2023-03-08 DIAGNOSIS — J4489 Other specified chronic obstructive pulmonary disease: Secondary | ICD-10-CM | POA: Diagnosis not present

## 2023-03-08 DIAGNOSIS — J9611 Chronic respiratory failure with hypoxia: Secondary | ICD-10-CM | POA: Diagnosis not present

## 2023-03-08 DIAGNOSIS — C3491 Malignant neoplasm of unspecified part of right bronchus or lung: Secondary | ICD-10-CM

## 2023-03-08 DIAGNOSIS — I272 Pulmonary hypertension, unspecified: Secondary | ICD-10-CM | POA: Diagnosis not present

## 2023-03-08 NOTE — Patient Instructions (Signed)
Your lungs sounded clear today.  Remember to take your Breztri 2 puffs twice a day.  Will see you in follow-up in 6 to 8 weeks time call sooner should any new problems arise.

## 2023-03-08 NOTE — Progress Notes (Signed)
Subjective:    Patient ID: Brianna Adams, female    DOB: 05/23/1941, 82 y.o.   MRN: [id number]  Patient Care Team: Arnette Felts, [name] as PCP - General (General Practice) Rickard Patience, MD as Consulting Physician (Oncology) Salena Saner, MD as Consulting Physician (Pulmonary Disease) Glory Buff, RN as Oncology Nurse Navigator  Chief Complaint  Patient presents with   Follow-up    SOB. No wheezing or cough.    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  She has severe pulmonary hypertension with prior negative connective tissue disease workup.  She is currently on Tyvaso and tadalafil for pulmonary hypertension and follows with the advanced heart failure clinic.  Has a prior history of right lower lobe and right middle lobe wedge resections on 25 Jan 2017 for a squamous cell carcinoma of the lung, this was done at the [facility].  He did not receive further therapy as she did clear margins tumor was noted to be pT2 pN0 pMX..  Subsequently after that in 2019 she had a left upper lobe lung nodule that was PET positive and was presumed malignant.  She underwent empiric SBRT to this lesion.  Her care has been complicated by very severe pulmonary hypertension and respiratory failure with hypoxia.  She is dependent and on oxygen at 4 L/min though occasionally with exertion has to go up to 5 L/min.  As noted she is being followed at the advanced failure clinic for her pulmonary hypertension.  We last saw her on 25 Jan 2023.  At that time she had had a CT chest for follow-up on her issues with prior lung cancer.  This was followed by a PET/CT that was performed on 24 Jan 2023.  The findings on imaging was concerning for a right upper lobe solid 3 cm lung mass with mediastinal adenopathy and Badik focus on the right 6 posterior rib.  In addition the patient had  multiple hypermetabolic hepatic metastatic lesions.  There was also a potential small focus of breast cancer on the right lower lateral breast.  The patient underwent needle biopsy of one of the liver lesions and this confirmed adenocarcinoma of lung origin.  He is being followed by medical oncology, Dr. Cathie Hoops.  Today she presents with no new complaint.  She is compliant with oxygen at 4 L/min.  She has not had any recent fevers, chills or sweats.  Dyspnea is at baseline and unchanged.  Does not notice wheezing or cough.  She does not endorse any other symptomatology.   Review of Systems A 10 point review of systems was performed and it is as noted above otherwise negative.   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   Hemorrhoids 12/01/2022   Hyperplastic polyp of intestine 12/01/2022   Lumbosacral radiculopathy 12/01/2022   Migraine 12/01/2022   Uterine leiomyoma 12/01/2022   Acute on chronic respiratory failure with hypoxia (HCC) 06/02/2022   Acute renal failure superimposed on stage 3a chronic kidney disease (HCC) 06/02/2022   Rectal bleeding    Mild non proliferative diabetic retinopathy (HCC) 08/05/2021   Encounter for preoperative pulmonary examination 07/21/2021   IDA (iron deficiency anemia) 06/24/2021   Squamous cell carcinoma lung, unspecified laterality (HCC) 06/04/2021  Pneumonia of right upper lobe due to infectious organism 03/09/2021   CKD (chronic kidney disease), stage II 03/09/2021   Chronic diastolic CHF (congestive heart failure) (HCC) 03/09/2021   HTN (hypertension) 03/09/2021   HLD (hyperlipidemia) 03/09/2021   COPD with acute exacerbation (HCC) 03/09/2021   Elevated troponin 03/09/2021   Type 2 diabetes mellitus with hyperlipidemia (HCC) 03/09/2021   Radiation fibrosis of lung  (HCC) 12/31/2020   Acute on chronic heart failure with preserved ejection fraction (HFpEF) (HCC) 12/09/2020   Lymphadenopathy    Pulmonary embolism (HCC) 02/29/2020   Chronic respiratory failure with hypoxia (HCC) 02/18/2020   Pulmonary nodule 02/18/2020   GERD (gastroesophageal reflux disease) 02/18/2020   Pulmonary hypertension, unspecified (HCC) 10/04/2019   COPD with chronic bronchitis and emphysema (HCC) 03/14/2019   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) 03/14/2019   Mass of thyroid gland 12/07/2016   Adrenal adenoma 11/18/2016   Non-toxic multinodular goiter 11/18/2016   Mass of left adrenal gland (HCC) 11/02/2016   Cataract 10/21/2016   Paresthesia of lower extremity 08/05/2016   Trochanteric bursitis of right hip 06/06/2016   Osteopenia 05/24/2016   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) 08/06/2014   Ventricular bigeminy 02/05/2011   Seizure (HCC) 09/06/1949    Social History   Tobacco Use   Smoking status: Former    Packs/day: 1.00    Years: 58.00    Additional pack years: 0.00    Total pack years: 58.00    Types: Cigarettes    Quit date: 10/22/2015    Years since quitting: 7.3    Passive exposure: Past   Smokeless tobacco: Never   Tobacco comments:    congratulated  Substance Use Topics   Alcohol use: Never    Allergies  Allergen Reactions   Ace Inhibitors Swelling   Penicillin G Hives, Shortness Of Breath and Rash   Zestril [Lisinopril] Swelling   Cortisone Other (See Comments)    Unknown reaction   Hydrocortisone Other (See Comments)    Unknown reaction   Lipitor [Atorvastatin] Other (See Comments)    Myalgias (Muscle Pain) in high dosages     Mobic [Meloxicam] Other (See Comments)    Bloody stools   Pravachol [Pravastatin] Other (See Comments)    Chest Pain if high dosage     Current Meds  Medication Sig   acetaminophen (TYLENOL) 500 MG tablet Take 1,000 mg by mouth every 6 (six) hours as needed for mild pain  or fever.   albuterol (VENTOLIN HFA) 108 (90 Base) MCG/ACT inhaler INHALE 2 PUFFS BY MOUTH EVERY 6 HOURS AS NEEDED FOR WHEEZE OR SHORTNESS OF BREATH   apixaban (ELIQUIS) 2.5 MG TABS tablet Take 1 tablet (2.5 mg total) by mouth 2 (two) times daily.   beta carotene w/minerals (OCUVITE) tablet Take 1 tablet by mouth in the morning.   Budeson-Glycopyrrol-Formoterol (BREZTRI AEROSPHERE) 160-9-4.8 MCG/ACT AERO Inhale 2 puffs into the lungs in the morning and at bedtime.   CALCIUM PO Take 1,200 mg by mouth in the morning.   Cinnamon 500 MG TABS Take 1,000 mg by mouth at bedtime.   ferrous sulfate 325 (65 FE) MG EC tablet Take 1 tablet (325 mg total) by mouth 2 (two) times daily with a meal.   JARDIANCE 10 MG TABS tablet TAKE ONE TABLET BY MOUTH EVERY DAY FOR BREAKFAST   KLOR-CON M20 20 MEQ tablet TAKE 1 TABLET BY MOUTH EVERY DAY   metFORMIN (GLUCOPHAGE) 1000 MG tablet TAKE 1 TABLET (1,000  MG TOTAL) BY MOUTH TWICE A DAY WITH FOOD   Multiple Vitamins-Minerals (CENTRUM SILVER 50+WOMEN PO) Take 1 tablet by mouth in the morning.   omeprazole (PRILOSEC) 40 MG capsule TAKE 1 CAPSULE BY MOUTH EVERY DAY   pravastatin (PRAVACHOL) 10 MG tablet TAKE 1 TABLET BY MOUTH EVERY DAY   Pseudoephedrine-guaiFENesin (MUCINEX D PO) Take 5 mLs by mouth at bedtime.   Spacer/Aero-Holding Rudean Curt Use with inhaler   tadalafil, PAH, (ADCIRCA) 20 MG tablet Take 2 tablets (40 mg total) by mouth daily.   torsemide (DEMADEX) 20 MG tablet TAKE 1 TABLET BY MOUTH EVERY DAY   Treprostinil (TYVASO DPI MAINTENANCE KIT) 16 MCG POWD Inhale 16 mcg into the lungs in the morning, at noon, in the evening, and at bedtime.   vitamin C (ASCORBIC ACID) 500 MG tablet Take 500 mg by mouth every Tuesday, Thursday, Saturday, and Sunday. In the morning.   vitamin E 180 MG (400 UNITS) capsule Take 400 Units by mouth every Monday, Wednesday, and Friday. In the morning.    Immunization History  Administered Date(s) Administered   Fluad Quad(high  Dose 65+) 05/24/2019, 05/13/2020, 06/18/2022   Influenza, High Dose Seasonal PF 06/01/2021   Influenza, Quadrivalent, Recombinant, Inj, Pf 07/15/2017   Influenza,inj,quad, With Preservative 05/29/2015, 06/06/2016, 06/06/2018   Influenza-Unspecified 06/06/2018, 06/18/2022   Moderna Covid-19 Vaccine Bivalent Booster 70yrs & up 04/24/2022   PFIZER(Purple Top)SARS-COV-2 Vaccination 10/20/2019, 11/19/2019, 06/11/2020   PNEUMOCOCCAL CONJUGATE-20 01/25/2023   Pfizer Covid-19 Vaccine Bivalent Booster 54yrs & up 06/01/2021   Pneumococcal Conjugate-13 10/31/2013   Pneumococcal Polysaccharide-23 12/05/1997, 11/05/2007, 04/07/2015   Respiratory Syncytial Virus Vaccine,Recomb Aduvanted(Arexvy) 09/20/2022   Rsv, Bivalent, Protein Subunit Rsvpref,pf (Abrysvo) 09/20/2022   Td 09/07/2003   Td (Adult),unspecified 09/07/2003   Tdap 08/07/2015, 08/17/2017   Zoster Recombinant(Shingrix) 08/17/2017, 06/28/2019, 09/05/2019   Zoster, Live 11/05/2010        Objective:     BP 110/60 (BP Location: Left Arm, Cuff Size: Normal)   Pulse 79   Temp 97.6 F (36.4 C)   Ht 5\' 2"  (1.575 m)   Wt 109 lb 6.4 oz (49.6 kg) Comment: per patient. in a wheelchair today  SpO2 99%   BMI 20.01 kg/m   SpO2: 99 % O2 Device: Nasal cannula O2 Flow Rate (L/min): 4 L/min O2 Type: Continuous O2  GENERAL: Well-developed, thin, elderly woman in no acute distress, mild tachypnea, she presents in transport chair.  Wearing oxygen at 4 L/min via nasal cannula. HEAD: Normocephalic, atraumatic.  EYES: Pupils equal, round, reactive to light.  No scleral icterus.  MOUTH: Oral mucosa moist.  No thrush NECK: Supple. No thyromegaly. Trachea midline.  Significant JVD.  No adenopathy. PULMONARY: Distant breath sounds, coarse, no adventitious sounds. CARDIOVASCULAR: S1 and S2. Regular rate and rhythm.  Previously noted murmur not appreciated.   GASTROINTESTINAL: Benign. MUSCULOSKELETAL: No joint deformity, no clubbing, no edema LE's.   Significant sarcopenia. NEUROLOGIC: No focal deficit.  Speech is fluent.  SKIN: Intact,warm,dry.  Limited exam no rashes PSYCH: Mood and behavior appropriate.       Assessment & Plan:     ICD-10-CM   1. COPD with chronic bronchitis and emphysema (HCC)  J44.89    J43.9    Continue Breztri 2 puffs twice a day Continue as needed albuterol    2. Chronic respiratory failure with hypoxia (HCC)  J96.11    Compliant with oxygen at 4 L/min Patient notes benefit from the therapy    3. Severe pulmonary hypertension (HCC)  I27.20  This issue adds complexity to her management Continue Tyvaso and Adcirca per advanced heart failure clinic    4. Primary lung adenocarcinoma, right (HCC)  C34.91    Stage IV adenocarcinoma of the lung Prognosis guarded at best given multiple other comorbidities Follows with oncology     We will see the patient in follow-up in 3 months time she is to contact us prior to that time should any new difficulties arise.  Gailen Shelter, MD Advanced Bronchoscopy PCCM [facility]    *This note was dictated using voice recognition software/Dragon.  Despite best efforts to proofread, errors can occur which can change the meaning. Any transcriptional errors that result from this process are unintentional and may not be fully corrected at the time of dictation.

## 2023-03-09 ENCOUNTER — Encounter: Payer: Self-pay | Admitting: Oncology

## 2023-03-09 NOTE — Telephone Encounter (Signed)
Results sent to scan. Copy given to MD.

## 2023-03-14 ENCOUNTER — Other Ambulatory Visit: Payer: Medicare Other

## 2023-03-22 ENCOUNTER — Inpatient Hospital Stay: Payer: Medicare Other | Attending: Oncology | Admitting: Oncology

## 2023-03-22 ENCOUNTER — Inpatient Hospital Stay (HOSPITAL_BASED_OUTPATIENT_CLINIC_OR_DEPARTMENT_OTHER): Payer: Medicare Other | Admitting: Hospice and Palliative Medicine

## 2023-03-22 ENCOUNTER — Encounter: Payer: Self-pay | Admitting: Oncology

## 2023-03-22 ENCOUNTER — Encounter: Payer: Self-pay | Admitting: *Deleted

## 2023-03-22 VITALS — BP 93/64 | HR 88 | Temp 96.7°F | Resp 20 | Wt 106.1 lb

## 2023-03-22 DIAGNOSIS — C3490 Malignant neoplasm of unspecified part of unspecified bronchus or lung: Secondary | ICD-10-CM

## 2023-03-22 DIAGNOSIS — I2609 Other pulmonary embolism with acute cor pulmonale: Secondary | ICD-10-CM

## 2023-03-22 DIAGNOSIS — I272 Pulmonary hypertension, unspecified: Secondary | ICD-10-CM | POA: Diagnosis not present

## 2023-03-22 DIAGNOSIS — Z515 Encounter for palliative care: Secondary | ICD-10-CM | POA: Diagnosis not present

## 2023-03-22 DIAGNOSIS — C3431 Malignant neoplasm of lower lobe, right bronchus or lung: Secondary | ICD-10-CM | POA: Insufficient documentation

## 2023-03-22 DIAGNOSIS — Z7901 Long term (current) use of anticoagulants: Secondary | ICD-10-CM | POA: Diagnosis not present

## 2023-03-22 DIAGNOSIS — C787 Secondary malignant neoplasm of liver and intrahepatic bile duct: Secondary | ICD-10-CM | POA: Diagnosis not present

## 2023-03-22 DIAGNOSIS — Z86711 Personal history of pulmonary embolism: Secondary | ICD-10-CM | POA: Insufficient documentation

## 2023-03-22 DIAGNOSIS — C3491 Malignant neoplasm of unspecified part of right bronchus or lung: Secondary | ICD-10-CM

## 2023-03-22 DIAGNOSIS — Z87891 Personal history of nicotine dependence: Secondary | ICD-10-CM | POA: Insufficient documentation

## 2023-03-22 DIAGNOSIS — N6320 Unspecified lump in the left breast, unspecified quadrant: Secondary | ICD-10-CM | POA: Diagnosis not present

## 2023-03-22 DIAGNOSIS — Z90710 Acquired absence of both cervix and uterus: Secondary | ICD-10-CM | POA: Insufficient documentation

## 2023-03-22 DIAGNOSIS — Z7189 Other specified counseling: Secondary | ICD-10-CM

## 2023-03-22 DIAGNOSIS — J9611 Chronic respiratory failure with hypoxia: Secondary | ICD-10-CM | POA: Diagnosis not present

## 2023-03-22 NOTE — Assessment & Plan Note (Signed)
FDG active on PET scan.  Given that patient has elected to proceed with hospice/comfort care for metastatic lung cancer, no need for further evaluation of the breast lesion

## 2023-03-22 NOTE — Assessment & Plan Note (Signed)
Continue follow up with pulmonology

## 2023-03-22 NOTE — Assessment & Plan Note (Signed)
Discussed with patient.  She understands that her condition is not curable.

## 2023-03-22 NOTE — Progress Notes (Signed)
Hematology/Oncology Progress note [phone] [phone]     CHIEF COMPLAINTS/REASON FOR VISIT:   history of pulmonary embolism.  Chronic anticoagulation, iron deficiency.  ASSESSMENT & PLAN:   Cancer Staging  Primary lung adenocarcinoma, right [facility]) Staging form: Lung, AJCC 8th Edition - Clinical stage from 02/22/2023: Stage IV (cT1c, cN2, pM1) - Signed by Rickard Patience, MD on 02/22/2023   Primary lung adenocarcinoma, right (HCC) CT, PET scan and liver lesion biopsy results were reviewed and discussed patient. 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 Prognosis is poor. Patient has multiple comorbidities, poor performance status, I do not think she is a candidate for aggressive chemotherapy. NGS showed no targetable mutation.  PD-L1 less than 1% TMB 13 I discussed with patient about option of immunotherapy.  Rationale potential side effects reviewed discussed with patient.  Patient is concerned about possible risk of immunotherapy induced pneumonitis, which may further exacerbate her current breathing condition.  She is not willing to take on any risk and prefers to focus on life quality.  We discussed about option of hospice/comfort care and she is in agreement. She will need palliative care service provider [facility].    Chronic respiratory failure with hypoxia (HCC) Continue follow-up with pulmonology.  Goals of care, counseling/discussion Discussed with patient.  She understands that her condition is not curable.  Left breast mass FDG active on PET scan.  Given that patient has elected to proceed with hospice/comfort care for metastatic lung cancer, no need for further evaluation of the breast lesion  Pulmonary embolism (HCC) #History of pulmonary embolism, possible provoked event, however patient has multiple other contributing factors including history of cancer, sedentary lifestyle,  limited mobility due to chronic respiratory failure. Continue Eliquis 2.5mg  BID    All questions were answered. The patient knows to call the clinic with any problems, questions or concerns.  Rickard Patience, MD, PhD [facility] Hematology Oncology 03/22/2023   HISTORY OF PRESENTING ILLNESS:   Brianna Adams is a  82 y.o.  female with PMH listed below was seen in consultation at the request of  Arnette Felts, [name]  for evaluation of history of pulmonary embolism.  Patient has been on oxygen via nasal cannula at home due to pulmonary hypertension.  She also has an early stage lung cancer status post a right lower lobe resection. 02/29/2020 patient presented emergency room for evaluation of shortness of breath.  Patient was found to have segmental PE of the right lower lobe with evidence of right heart strain.  And new area of the consolidation in the left lower lobe concerning for pneumonia.  Lower extremity ultrasound was negative for DVT.   Patient also was treated for pneumonia, acute decompensated CHF.Marland Kitchen  Patient was placed on heparin and discharged on Eliquis.  Patient takes Eliquis 5 mg twice daily since then on 05/08/2021, when she developed rectal bleeding.  Eliquis was decreased to 2.5 mg twice daily.  Patient currently is on nasal cannula oxygen 4 L.  Patient was accompanied by her daughter today.  Daughter reports history of long distance car trip-6 hours  prior to patient's PE diagnosis.Daughter is not sure about exactly the timeframe of immobilization event.  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  Patient follows up with Dr. Jayme Cloud for surveillance, lung emphysema, chronic respiratory failure.  Patient denies any additional episodes of rectal bleeding since decrease to Eliquis 2.5 mg twice daily. Per patient and her daughter, patient has been quite active  despite chronic respiratory failure.  Per daughter, patient walks faster than her. Patient has. been referred to establish care with gastroenterology.  08/13/2021, flexible sigmoidoscopy showed diverticulosis.  Nonbleeding internal hemorrhoids.  No specimen was collected.  INTERVAL HISTORY Brianna Adams is a 82 y.o. female who has above history reviewed by me today presents for follow up visit for  history of pulmonary embolism.  Chronic anticoagulation, iron deficiency, and new diagnosis of lung cancer. Patient was accompanied by his daughter. Chronic shortness of breath, patient is not nasal cannula oxygen.   Review of Systems  Constitutional:  Positive for fatigue. Negative for appetite change, chills and fever.  HENT:   Negative for hearing loss and voice change.   Eyes:  Negative for eye problems.  Respiratory:  Positive for shortness of breath. Negative for chest tightness, cough and hemoptysis.   Cardiovascular:  Negative for chest pain.  Gastrointestinal:  Negative for abdominal distention, abdominal pain and blood in stool.  Endocrine: Negative for hot flashes.  Genitourinary:  Negative for difficulty urinating and frequency.   Musculoskeletal:  Negative for arthralgias.  Skin:  Negative for itching and rash.  Neurological:  Negative for extremity weakness.  Hematological:  Negative for adenopathy.  Psychiatric/Behavioral:  Negative for confusion.     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   PE (pulmonary thromboembolism) (HCC) 02/29/2020   Pulmonary hypertension (HCC)    Uterine cancer (HCC)     SURGICAL HISTORY: Past Surgical  History:  Procedure Laterality Date   ABDOMINAL HYSTERECTOMY     cataract surgery  2012 and 2017   COLONOSCOPY     FIBEROPTIC BRONCHOSCOPY     FLEXIBLE SIGMOIDOSCOPY N/A 08/13/2021   Procedure: FLEXIBLE SIGMOIDOSCOPY;  Surgeon: Imogene Burn, MD;  Location: [facility];  Service: Gastroenterology;  Laterality: N/A;   GANGLION CYST EXCISION Left 09/11/2020   Procedure: EXCISION VOLAR RADIAL GANGLION OF LEFT WRIST;  Surgeon: Cindee Salt, MD;  Location: [facility] OR;  Service: Orthopedics;  Laterality: Left;  AXILLARY BLOCK   resection of lung cancer  2018   RIGHT HEART CATH N/A 10/20/2021   Procedure: RIGHT HEART CATH;  Surgeon: Marykay Lex, MD;  Location: [facility] INVASIVE CV LAB;  Service: Cardiovascular;  Laterality: N/A;   RIGHT HEART CATH N/A 03/18/2022   Procedure: RIGHT HEART CATH;  Surgeon: Laurey Morale, MD;  Location: [facility] INVASIVE CV LAB;  Service: Cardiovascular;  Laterality: N/A;   RIGHT HEART CATH N/A 06/04/2022   Procedure: RIGHT HEART CATH;  Surgeon: Laurey Morale, MD;  Location: [facility] INVASIVE CV LAB;  Service: Cardiovascular;  Laterality: N/A;   right lower lobe non-anatomocal lung resection wedge     right thorascoscopy      SOCIAL HISTORY: Social History   Socioeconomic History   Marital status: Widowed    Spouse name: Not on file   Number of children: 1   Years of education: Not on file  Highest education level: Master's degree (e.g., MA, MS, MEng, MEd, MSW, MBA)  Occupational History   Occupation: retired    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[employer], broadcasting/film/video  Tobacco Use   Smoking status: Former    Current packs/day: 0.00    Average packs/day: 1 pack/day for 58.0 years (58.0 ttl pk-yrs)    Types: Cigarettes    Start date: 10/21/1957    Quit date: 10/22/2015    Years since quitting: 7.4    Passive exposure: Past   Smokeless tobacco: Never   Tobacco comments:    congratulated  Vaping Use   Vaping status: Never Used  Substance and Sexual Activity   Alcohol use: Never   Drug use:  Never   Sexual activity: Not Currently    Birth control/protection: Surgical    Comment: HYSTERECTOMY  Other Topics Concern   Not on file  Social History Narrative   Not on file   Social Determinants of Health   Financial Resource Strain: Low Risk  (01/27/2023)   Overall Financial Resource Strain (CARDIA)    Difficulty of Paying Living Expenses: Not hard at all  Food Insecurity: No Food Insecurity (01/27/2023)   Hunger Vital Sign    Worried About Running Out of Food in the Last Year: Never true    Ran Out of Food in the Last Year: Never true  Transportation Needs: No Transportation Needs (01/27/2023)   PRAPARE - Administrator, Civil Service (Medical): No    Lack of Transportation (Non-Medical): No  Physical Activity: Insufficiently Active (01/27/2023)   Exercise Vital Sign    Days of Exercise per Week: 2 days    Minutes of Exercise per Session: 60 min  Stress: No Stress Concern Present (01/27/2023)   [employer] of Occupational Health - Occupational Stress Questionnaire    Feeling of Stress : Not at all  Social Connections: Not on file  Intimate Partner Violence: Not At Risk (06/02/2022)   Humiliation, Afraid, Rape, and Kick questionnaire    Fear of Current or Ex-Partner: No    Emotionally Abused: No    Physically Abused: No    Sexually Abused: No    FAMILY HISTORY: Family History  Problem Relation Age of Onset   Hypertension Mother    Heart attack Father    Cancer Maternal Aunt        unknown type   Stomach cancer Neg Hx    Colon cancer Neg Hx    Esophageal cancer Neg Hx    Pancreatic cancer Neg Hx     ALLERGIES:  is allergic to ace inhibitors, penicillin g, zestril [lisinopril], cortisone, hydrocortisone, lipitor [atorvastatin], mobic [meloxicam], and pravachol [pravastatin].  MEDICATIONS:  Current Outpatient Medications  Medication Sig Dispense Refill   acetaminophen (TYLENOL) 500 MG tablet Take 1,000 mg by mouth every 6 (six) hours as needed for  mild pain or fever.     albuterol (VENTOLIN HFA) 108 (90 Base) MCG/ACT inhaler INHALE 2 PUFFS BY MOUTH EVERY 6 HOURS AS NEEDED FOR WHEEZE OR SHORTNESS OF BREATH 8.5 each 5   apixaban (ELIQUIS) 2.5 MG TABS tablet Take 1 tablet (2.5 mg total) by mouth 2 (two) times daily. 180 tablet 1   beta carotene w/minerals (OCUVITE) tablet Take 1 tablet by mouth in the morning.     Budeson-Glycopyrrol-Formoterol (BREZTRI AEROSPHERE) 160-9-4.8 MCG/ACT AERO Inhale 2 puffs into the lungs in the morning and at bedtime. 10.7 g 12   CALCIUM PO Take 1,200 mg by mouth in the morning.  Cinnamon 500 MG TABS Take 1,000 mg by mouth at bedtime.     ferrous sulfate 325 (65 FE) MG EC tablet Take 1 tablet (325 mg total) by mouth 2 (two) times daily with a meal. 180 tablet 1   JARDIANCE 10 MG TABS tablet TAKE ONE TABLET BY MOUTH EVERY DAY FOR BREAKFAST 30 tablet 0   KLOR-CON M20 20 MEQ tablet TAKE 1 TABLET BY MOUTH EVERY DAY 90 tablet 1   metFORMIN (GLUCOPHAGE) 1000 MG tablet TAKE 1 TABLET (1,000 MG TOTAL) BY MOUTH TWICE A DAY WITH FOOD 180 tablet 1   Multiple Vitamins-Minerals (CENTRUM SILVER 50+WOMEN PO) Take 1 tablet by mouth in the morning.     omeprazole (PRILOSEC) 40 MG capsule TAKE 1 CAPSULE BY MOUTH EVERY DAY 90 capsule 1   pravastatin (PRAVACHOL) 10 MG tablet TAKE 1 TABLET BY MOUTH EVERY DAY 90 tablet 1   Pseudoephedrine-guaiFENesin (MUCINEX D PO) Take 5 mLs by mouth at bedtime.     Spacer/Aero-Holding Rudean Curt Use with inhaler 1 each 2   tadalafil, PAH, (ADCIRCA) 20 MG tablet Take 2 tablets (40 mg total) by mouth daily. 180 tablet 3   torsemide (DEMADEX) 20 MG tablet TAKE 1 TABLET BY MOUTH EVERY DAY 60 tablet 2   Treprostinil (TYVASO DPI MAINTENANCE KIT) 16 MCG POWD Inhale 16 mcg into the lungs in the morning, at noon, in the evening, and at bedtime. 112 each 11   vitamin C (ASCORBIC ACID) 500 MG tablet Take 500 mg by mouth every Tuesday, Thursday, Saturday, and Sunday. In the morning.     vitamin E 180 MG  (400 UNITS) capsule Take 400 Units by mouth every Monday, Wednesday, and Friday. In the morning.     No current facility-administered medications for this visit.     PHYSICAL EXAMINATION: ECOG PERFORMANCE STATUS: 2 - Symptomatic, <50% confined to bed Vitals:   03/22/23 1135  BP: 93/64  Pulse: 88  Resp: 20  Temp: (!) 96.7 F (35.9 C)  SpO2: 92%   Filed Weights   03/22/23 1135  Weight: 106 lb 1.6 oz (48.1 kg)    Physical Exam Constitutional:      General: She is not in acute distress.    Comments: Patient sits in the wheelchair  HENT:     Head: Normocephalic and atraumatic.  Eyes:     General: No scleral icterus. Cardiovascular:     Rate and Rhythm: Normal rate and regular rhythm.     Heart sounds: Normal heart sounds.  Pulmonary:     Effort: Pulmonary effort is normal. No respiratory distress.     Breath sounds: No wheezing.     Comments: Decreased breath sound bilaterally.  Patient breathes comfortably via nasal cannula oxygen Abdominal:     General: Bowel sounds are normal. There is no distension.     Palpations: Abdomen is soft.  Musculoskeletal:        General: No deformity. Normal range of motion.     Cervical back: Normal range of motion and neck supple.  Skin:    General: Skin is warm and dry.     Findings: No erythema or rash.  Neurological:     Mental Status: She is alert and oriented to person, place, and time. Mental status is at baseline.     Cranial Nerves: No cranial nerve deficit.     Coordination: Coordination normal.  Psychiatric:        Mood and Affect: Mood normal.     LABORATORY DATA:  I have reviewed the data as listed    Latest Ref Rng & Units 02/03/2023    8:41 AM 11/10/2022    3:27 PM 10/20/2022    9:38 AM  CBC  WBC 4.0 - 10.5 K/uL 5.7  7.4  4.4   Hemoglobin 12.0 - 15.0 g/dL 08.6  57.8  46.9   Hematocrit 36.0 - 46.0 % 35.3  33.6  42.1   Platelets 150 - 400 K/uL 364  303  190       Latest Ref Rng & Units 02/03/2023    8:41 AM  01/04/2023   11:22 AM 11/10/2022    3:27 PM  CMP  Glucose 70 - 99 mg/dL 629  528  413   BUN 8 - 23 mg/dL 48  39  32   Creatinine 0.44 - 1.00 mg/dL 2.44  0.10  2.72   Sodium 135 - 145 mmol/L 134  132  136   Potassium 3.5 - 5.1 mmol/L 5.0  4.2  3.7   Chloride 98 - 111 mmol/L 98  96  97   CO2 22 - 32 mmol/L 23  23  25    Calcium 8.9 - 10.3 mg/dL 9.6  9.8  9.6   Total Protein 6.5 - 8.1 g/dL 7.3     Total Bilirubin 0.3 - 1.2 mg/dL 0.4     Alkaline Phos 38 - 126 U/L 55     AST 15 - 41 U/L 33     ALT 0 - 44 U/L 22        Iron/TIBC/Ferritin/ %Sat    Component Value Date/Time   IRON 38 10/20/2022 0938   TIBC 455 (H) 10/20/2022 0938   FERRITIN 9 (L) 10/20/2022 0938   IRONPCTSAT 8 (L) 10/20/2022 5366       RADIOGRAPHIC STUDIES: I have personally reviewed the radiological images as listed and agreed with the findings in the report. US BIOPSY (LIVER)  Result Date: 02/03/2023 INDICATION: Liver Mets EXAM: Ultrasound-guided core needle biopsy of focal liver lesion MEDICATIONS: None. ANESTHESIA/SEDATION: Local analgesia COMPLICATIONS: None immediate. PROCEDURE: Informed written consent was obtained from the patient after a thorough discussion of the procedural risks, benefits and alternatives. All questions were addressed. Maximal Sterile Barrier Technique was utilized including caps, mask, sterile gowns, sterile gloves, sterile drape, hand hygiene and skin antiseptic. A timeout was performed prior to the initiation of the procedure. The patient was placed supine on the exam table. Ultrasound of the abdomen demonstrated numerous hypoechoic lesions scattered throughout the liver. An appropriate lesion in the left hepatic lobe was selected for biopsy. Skin entry site was marked, and the overlying skin was prepped draped in the standard sterile fashion. Local analgesia was obtained with 1% lidocaine. Using ultrasound guidance, a 17 gauge introducer needle was advanced towards the selected focal lesion in  the left hepatic lobe. Subsequently, core needle biopsy was performed of the focal lesion in the left hepatic lobe using an 18 gauge core biopsy device x3 total passes. Specimens were submitted in formalin to pathology for further handling. Limited postprocedure imaging demonstrated no hematoma. A clean dressing was placed after manual hemostasis. The patient tolerated the procedure well without immediate complication. IMPRESSION: Successful ultrasound-guided core needle biopsy of focal lesion in the left hepatic lobe. Electronically Signed   By: Olive Bass M.D.   On: 02/03/2023 10:50   NM PET Image Restage (PS) Skull Base to Thigh (F-18 FDG)  Result Date: 01/26/2023 CLINICAL DATA:  Subsequent treatment strategy for previous lung cancers. New  3 cm lung mass on recent chest CT along with mediastinal adenopathy. EXAM: NUCLEAR MEDICINE PET SKULL BASE TO THIGH TECHNIQUE: 6.2 mCi F-18 FDG was injected intravenously. Full-ring PET imaging was performed from the skull base to thigh after the radiotracer. CT data was obtained and used for attenuation correction and anatomic localization. Fasting blood glucose: 150 mg/dl COMPARISON:  Multiple previous imaging studies. The most recent chest CT is 01/12/2023. FINDINGS: Mediastinal blood pool activity: SUV max 1.88 Liver activity: SUV max NA NECK: No hypermetabolic lymph nodes in the neck. Incidental CT findings: None. CHEST: Central right upper lobe pulmonary mass measuring 3 x 2.4 cm is hypermetabolic with SUV max of 8.95 and consistent with primary lung neoplasm. Adjacent right paratracheal nodal disease has an SUV max of 9.29. No definite pulmonary nodules to suggest pulmonary metastatic disease. There is a small hypermetabolic nodule in the right lower lateral breast. This measures 10 mm and has an SUV max of 6.04 and is likely a small focus of breast cancer. No axillary or supraclavicular adenopathy. Incidental CT findings: Multinodular thyroid goiter is stable.  No hypermetabolic thyroid lesions. Underlying severe chronic lung disease with emphysema and pulmonary scarring. ABDOMEN/PELVIS: Multiple hypermetabolic hepatic metastatic lesions. 19 mm segment 2 lesion has an SUV max of 7.74. Far laterally in segment 2 is a 19 mm lesion which has an SUV max of 7.92. Segment 3 lesion measures 2.7 cm and has an SUV max of 7.17. Segment 5 lesion measures 2.3 cm and has an SUV max of 5.11. Multiple other hypermetabolic hepatic metastasis. No adrenal gland lesions.  No abdominal or pelvic lymphadenopathy. Extensive bowel activity could be due to recently eating or taking insulin. Incidental CT findings: Advanced vascular disease. Diffuse colonic diverticulosis. SKELETON: Hypermetabolic focus in the right sixth posterior rib with SUV max of 3.72. No obvious lesion on the CT scan but suspicious for metastatic focus. Do not see any other definite bone lesions Incidental CT findings: Remote post treatment changes involving the left chest wall with previously destroyed left fifth lateral rib. IMPRESSION: 1. 3 cm right upper lobe lung mass is hypermetabolic and consistent with primary lung neoplasm. 2. Adjacent right paratracheal nodal disease. 3. Multiple hypermetabolic hepatic metastatic lesions. 4. Hypermetabolic right sixth posterior rib lesion suspicious for metastatic disease. 5. 10 mm right lower lateral breast lesion is hypermetabolic and likely a small focus of breast cancer. Electronically Signed   By: Rudie Meyer M.D.   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 Additional history of SBRT to an upper lobe pulmonary nodule per prior radiology report. COPD. Restaging. * Tracking Code: BO * EXAM: CT CHEST WITHOUT CONTRAST TECHNIQUE: Multidetector CT imaging of the chest was performed following the standard protocol without IV contrast. RADIATION DOSE  REDUCTION: This exam was performed according to the departmental dose-optimization program which includes automated exposure control, adjustment of the mA and/or kV according to patient size and/or use of iterative reconstruction technique. COMPARISON:  01/20/2022 chest CT. FINDINGS: Cardiovascular: Borderline mild cardiomegaly. No significant pericardial effusion/thickening. Left anterior descending and left circumflex coronary atherosclerosis. Atherosclerotic nonaneurysmal thoracic aorta. Normal caliber pulmonary arteries. Mediastinum/Nodes: Stable mildly enlarged heterogeneous thyroid gland without discrete thyroid nodules. This has been evaluated on previous imaging. (ref: J Am Coll Radiol. 2015 Feb;12(2): 143-50). Unremarkable esophagus. No axillary adenopathy. Newly enlarged 2.6 cm short axis diameter right paratracheal node (series 2/image  41). Newly enlarged 1.2 cm subcarinal node (series 2/image 70). No discrete hilar adenopathy on these noncontrast images. Lungs/Pleura: No pneumothorax. No pleural effusion. 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 Central right upper lobe solid 3.0 x 2.4 cm lung mass (series 3/image 45), previously 1.3 x 1.1 cm. Stable postsurgical changes from wedge resection in the right lower lobe with curvilinear parenchymal band and distortion along the suture line. Stable sharply marginated bandlike consolidation in peripheral left upper lobe with associated mild volume loss and distortion, compatible with radiation fibrosis. No additional significant pulmonary nodules. Upper abdomen: Small hiatal hernia. Several (at least 5) new hypodense liver masses scattered throughout the liver, with representative 2.5 cm inferior left liver mass (series 2/image 152), representative superior left liver 2.9 cm mass (series 2/image 130) and representative peripheral right liver 1.9 cm mass (series 2/image 151). Left colonic diverticulosis. Musculoskeletal: No aggressive appearing focal osseous  lesions. Chronic incompletely healed lateral left fifth rib fracture. Mild thoracic spondylosis. IMPRESSION: 1. Central right upper lobe solid 3.0 cm lung mass, compatible with malignancy, favoring a metachronous primary bronchogenic malignancy. 2. New right paratracheal and subcarinal lymphadenopathy, suspicious for nodal metastases. 3. Several (at least 5) new hypodense liver masses scattered throughout the liver, suspicious for liver metastases. 4. Suggest PET-CT for further characterization. 5. Stable postsurgical changes in the right lower lobe and stable postradiation change in the left upper lobe, with no evidence of local tumor recurrence in these locations. 6. Borderline mild cardiomegaly. Two-vessel coronary atherosclerosis. 7. Small hiatal hernia. 8. Left colonic diverticulosis. 9. Aortic Atherosclerosis (ICD10-I70.0) and Emphysema (ICD10-J43.9). These results will be called to the ordering clinician or representative by the Radiologist Assistant, and communication documented in the PACS or [employer]. Electronically Signed   By: Delbert Phenix M.D.   On: 01/16/2023 13:43

## 2023-03-22 NOTE — Assessment & Plan Note (Addendum)
CT, PET scan and liver lesion biopsy results were reviewed and discussed patient. 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 Prognosis is poor. Patient has multiple comorbidities, poor performance status, I do not think she is a candidate for aggressive chemotherapy. NGS showed no targetable mutation.  PD-L1 less than 1% TMB 13 I discussed with patient about option of immunotherapy.  Rationale potential side effects reviewed discussed with patient.  Patient is concerned about possible risk of immunotherapy induced pneumonitis, which may further exacerbate her current breathing condition.  She is not willing to take on any risk and prefers to focus on life quality.  We discussed about option of hospice/comfort care and she is in agreement. She will need palliative care service provider [facility].

## 2023-03-22 NOTE — Progress Notes (Signed)
Palliative Medicine [facility] at [facility] [phone] [phone]) [phone]   Name: Brianna Adams Date: 03/22/2023 MRN: [id number]  DOB: 1941-07-02  Patient Care Team: Arnette Felts, [name] as PCP - General (General Practice) Rickard Patience, MD as Consulting Physician (Oncology) Salena Saner, MD as Consulting Physician (Pulmonary Disease) Glory Buff, RN as Oncology Nurse Navigator    REASON FOR CONSULTATION: Brianna Adams is a 82 y.o. 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  Palliative care was consulted to address goals.  SOCIAL HISTORY:     reports that she quit smoking about 7 years ago. Her smoking use included cigarettes. She started smoking about 65 years ago. She has a 58 pack-year smoking history. She has been exposed to tobacco smoke. She has never used smokeless tobacco. She reports that she does not drink alcohol and does not use drugs.  Patient lives at home with her daughter  ADVANCE DIRECTIVES:  Not on file  CODE STATUS: DNR  PAST MEDICAL HISTORY: Past Medical History:  Diagnosis Date   CHF (congestive heart failure) (HCC)    COPD (chronic obstructive pulmonary disease) (HCC)    Diabetes (HCC)    type 2   Diverticulitis    Dyspnea    On Oxygen 3L via [place]    GERD (gastroesophageal reflux disease)    High cholesterol    Lung cancer (HCC)    Mild non proliferative diabetic retinopathy (HCC) 08/05/2021   PE (pulmonary thromboembolism) (HCC) 02/29/2020   Pulmonary hypertension (HCC)    Uterine cancer (HCC)     PAST SURGICAL HISTORY:  Past Surgical History:  Procedure Laterality Date   ABDOMINAL HYSTERECTOMY     cataract surgery  2012 and 2017   COLONOSCOPY     FIBEROPTIC BRONCHOSCOPY     FLEXIBLE SIGMOIDOSCOPY N/A 08/13/2021   Procedure: FLEXIBLE SIGMOIDOSCOPY;  Surgeon: Imogene Burn, MD;  Location: [facility]  ENDOSCOPY;  Service: Gastroenterology;  Laterality: N/A;   GANGLION CYST EXCISION Left 09/11/2020   Procedure: EXCISION VOLAR RADIAL GANGLION OF LEFT WRIST;  Surgeon: Cindee Salt, MD;  Location: [facility] OR;  Service: Orthopedics;  Laterality: Left;  AXILLARY BLOCK   resection of lung cancer  2018   RIGHT HEART CATH N/A 10/20/2021   Procedure: RIGHT HEART CATH;  Surgeon: Marykay Lex, MD;  Location: [facility] INVASIVE CV LAB;  Service: Cardiovascular;  Laterality: N/A;   RIGHT HEART CATH N/A 03/18/2022   Procedure: RIGHT HEART CATH;  Surgeon: Laurey Morale, MD;  Location: [facility] INVASIVE CV LAB;  Service: Cardiovascular;  Laterality: N/A;   RIGHT HEART CATH N/A 06/04/2022   Procedure: RIGHT HEART CATH;  Surgeon: Laurey Morale, MD;  Location: [facility] INVASIVE CV LAB;  Service: Cardiovascular;  Laterality: N/A;   right lower lobe non-anatomocal lung resection wedge     right thorascoscopy      HEMATOLOGY/ONCOLOGY HISTORY:  Oncology History  Primary lung adenocarcinoma, right (HCC)  01/16/2023 Imaging   CT chest without contrast showed 1. Central right upper lobe solid 3.0 cm lung mass, compatible with malignancy, favoring a metachronous primary bronchogenic malignancy. 2. New right paratracheal and subcarinal lymphadenopathy, suspicious for nodal metastases. 3. Several (at least 5) new hypodense liver masses scattered throughout the liver, suspicious for liver metastases. 4. Suggest PET-CT for further characterization. 5. Stable postsurgical changes in the right lower lobe and stable postradiation change in the left upper lobe, with no  evidence of local tumor recurrence in these locations. 6. Borderline mild cardiomegaly. Two-vessel coronary atherosclerosis. 7. Small hiatal hernia. 8. Left colonic diverticulosis. 9. Aortic Atherosclerosis (ICD10-I70.0) and Emphysema (ICD10-J43.9).   01/26/2023 Imaging   PET scan showed 1. 3 cm right upper lobe lung mass is hypermetabolic and consistent with primary lung  neoplasm. 2. Adjacent right paratracheal nodal disease. 3. Multiple hypermetabolic hepatic metastatic lesions. 4. Hypermetabolic right sixth posterior rib lesion suspicious for metastatic disease. 5. 10 mm right lower lateral breast lesion is hypermetabolic and likely a small focus of breast cancer.     02/22/2023 Initial Diagnosis   Primary lung adenocarcinoma, right (HCC)  02/03/2023, patient underwent ultrasound-guided biopsy of left hepatic lobe lesion. Pathology showed metastatic adenocarcinoma consistent with lung origin.  Carcinoma is positive for cytokeratin 7 and TTF-1 and negative for cytokeratin 20, GATA3, CDX2, and p40    02/22/2023 Cancer Staging   Staging form: Lung, AJCC 8th Edition - Clinical stage from 02/22/2023: Stage IV (cT1c, cN2, pM1) - Signed by Rickard Patience, MD on 02/22/2023 Stage prefix: Initial diagnosis     ALLERGIES:  is allergic to ace inhibitors, penicillin g, zestril [lisinopril], cortisone, hydrocortisone, lipitor [atorvastatin], mobic [meloxicam], and pravachol [pravastatin].  MEDICATIONS:  Current Outpatient Medications  Medication Sig Dispense Refill   acetaminophen (TYLENOL) 500 MG tablet Take 1,000 mg by mouth every 6 (six) hours as needed for mild pain or fever.     albuterol (VENTOLIN HFA) 108 (90 Base) MCG/ACT inhaler INHALE 2 PUFFS BY MOUTH EVERY 6 HOURS AS NEEDED FOR WHEEZE OR SHORTNESS OF BREATH 8.5 each 5   apixaban (ELIQUIS) 2.5 MG TABS tablet Take 1 tablet (2.5 mg total) by mouth 2 (two) times daily. 180 tablet 1   beta carotene w/minerals (OCUVITE) tablet Take 1 tablet by mouth in the morning.     Budeson-Glycopyrrol-Formoterol (BREZTRI AEROSPHERE) 160-9-4.8 MCG/ACT AERO Inhale 2 puffs into the lungs in the morning and at bedtime. 10.7 g 12   CALCIUM PO Take 1,200 mg by mouth in the morning.     Cinnamon 500 MG TABS Take 1,000 mg by mouth at bedtime.     ferrous sulfate 325 (65 FE) MG EC tablet Take 1 tablet (325 mg total) by mouth 2 (two) times  daily with a meal. 180 tablet 1   JARDIANCE 10 MG TABS tablet TAKE ONE TABLET BY MOUTH EVERY DAY FOR BREAKFAST 30 tablet 0   KLOR-CON M20 20 MEQ tablet TAKE 1 TABLET BY MOUTH EVERY DAY 90 tablet 1   metFORMIN (GLUCOPHAGE) 1000 MG tablet TAKE 1 TABLET (1,000 MG TOTAL) BY MOUTH TWICE A DAY WITH FOOD 180 tablet 1   Multiple Vitamins-Minerals (CENTRUM SILVER 50+WOMEN PO) Take 1 tablet by mouth in the morning.     omeprazole (PRILOSEC) 40 MG capsule TAKE 1 CAPSULE BY MOUTH EVERY DAY 90 capsule 1   pravastatin (PRAVACHOL) 10 MG tablet TAKE 1 TABLET BY MOUTH EVERY DAY 90 tablet 1   Pseudoephedrine-guaiFENesin (MUCINEX D PO) Take 5 mLs by mouth at bedtime.     Spacer/Aero-Holding Rudean Curt Use with inhaler 1 each 2   tadalafil, PAH, (ADCIRCA) 20 MG tablet Take 2 tablets (40 mg total) by mouth daily. 180 tablet 3   torsemide (DEMADEX) 20 MG tablet TAKE 1 TABLET BY MOUTH EVERY DAY 60 tablet 2   Treprostinil (TYVASO DPI MAINTENANCE KIT) 16 MCG POWD Inhale 16 mcg into the lungs in the morning, at noon, in the evening, and at bedtime. 112 each 11  vitamin C (ASCORBIC ACID) 500 MG tablet Take 500 mg by mouth every Tuesday, Thursday, Saturday, and Sunday. In the morning.     vitamin E 180 MG (400 UNITS) capsule Take 400 Units by mouth every Monday, Wednesday, and Friday. In the morning.     No current facility-administered medications for this visit.    VITAL SIGNS: There were no vitals taken for this visit. There were no vitals filed for this visit.  Estimated body mass index is 19.41 kg/m as calculated from the following:   Height as of 03/08/23: 5\' 2"  (1.575 m).   Weight as of an earlier encounter on 03/22/23: 106 lb 1.6 oz (48.1 kg).  LABS: CBC:    Component Value Date/Time   WBC 5.7 02/03/2023 0841   HGB 10.4 (L) 02/03/2023 0841   HGB 9.9 (L) 06/04/2021 1137   HCT 35.3 (L) 02/03/2023 0841   HCT 31.2 (L) 06/04/2021 1137   PLT 364 02/03/2023 0841   PLT 313 06/04/2021 1137   MCV 82.9  02/03/2023 0841   MCV 86 06/04/2021 1137   NEUTROABS 2.6 10/20/2022 0938   LYMPHSABS 1.3 10/20/2022 0938   MONOABS 0.3 10/20/2022 0938   EOSABS 0.1 10/20/2022 0938   BASOSABS 0.1 10/20/2022 0938   Comprehensive Metabolic Panel:    Component Value Date/Time   NA 134 (L) 02/03/2023 0841   NA 140 07/06/2022 1307   K 5.0 02/03/2023 0841   CL 98 02/03/2023 0841   CO2 23 02/03/2023 0841   BUN 48 (H) 02/03/2023 0841   BUN 26 07/06/2022 1307   CREATININE 1.54 (H) 02/03/2023 0841   GLUCOSE 168 (H) 02/03/2023 0841   CALCIUM 9.6 02/03/2023 0841   AST 33 02/03/2023 0841   ALT 22 02/03/2023 0841   ALKPHOS 55 02/03/2023 0841   BILITOT 0.4 02/03/2023 0841   BILITOT 0.3 03/03/2021 1150   PROT 7.3 02/03/2023 0841   PROT 8.0 03/03/2021 1150   ALBUMIN 3.8 02/03/2023 0841   ALBUMIN 4.9 (H) 03/03/2021 1150    RADIOGRAPHIC STUDIES: No results found.  PERFORMANCE STATUS (ECOG) : 2 - Symptomatic, <50% confined to bed  Review of Systems Unless otherwise noted, a complete review of systems is negative.  Physical Exam General: NAD Cardiovascular: regular rate and rhythm Pulmonary: clear ant fields Abdomen: soft, nontender, + bowel sounds GU: no suprapubic tenderness Extremities: no edema, no joint deformities Skin: no rashes Neurological: Weakness but otherwise nonfocal  IMPRESSION: Patient was an add-on to my clinic schedule today at Dr. Bethanne Ginger request.  I met with patient and her daughter.  Patient with recent diagnosis of stage IV adenocarcinoma of the lung.  PET scan revealed 3 cm right upper lobe lung mass with adjacent nodal disease and multiple hepatic metastatic lesions and skeletal metastasis.  Patient has poor performance status at baseline with multiple comorbidities including severe pulmonary hypertension and chronic O2 use.  Patient is not felt to be a candidate for systemic chemotherapy.  PD-L1 negative limiting options for immunotherapy.  Today, patient tells me that she  is not interested in pursuing cancer treatment and instead would like to stay home and focus on comfort measures.  She is interested in pursuing hospice at home and I will coordinate that referral.  Patient confirms DNR/DNI.  PLAN: -Best supportive care -Referral to hospice -DNR/DNI  Case and plan discussed with Dr. Cathie Hoops  Patient expressed understanding and was in agreement with this plan. She also understands that She can call the clinic at any time with any  questions, concerns, or complaints.     Time Total: 25 minutes  Visit consisted of counseling and education dealing with the complex and emotionally intense issues of symptom management and palliative care in the setting of serious and potentially life-threatening illness.Greater than 50%  of this time was spent counseling and coordinating care related to the above assessment and plan.  Signed by: Laurette Schimke, PhD, NP-C

## 2023-03-22 NOTE — Assessment & Plan Note (Signed)
#  History of pulmonary embolism, possible provoked event, however patient has multiple other contributing factors including history of cancer, sedentary lifestyle, limited mobility due to chronic respiratory failure. Continue Eliquis 2.5mg  BID \

## 2023-03-24 ENCOUNTER — Other Ambulatory Visit: Payer: Self-pay

## 2023-03-24 ENCOUNTER — Encounter: Payer: Self-pay | Admitting: *Deleted

## 2023-03-24 DIAGNOSIS — C3491 Malignant neoplasm of unspecified part of right bronchus or lung: Secondary | ICD-10-CM

## 2023-03-24 NOTE — Progress Notes (Signed)
This NN reached out to pt's daughter, [name], in regard to a referral placed for a second opinion with Dr.Mohamed. This NN offered an appt for tomorrow 7/19 at 10am with Dr.Mohamed for the pt. Benita was amenable to this offer. This NN explained to Benita that the pt will need to have labs drawn at 9:30 prior to her appt with Dr.Mohamed at 10am and that the lab is in the cancer center. Benita is aware of the location of the cancer center. Benita verbalized understanding and appreciation of all the information provided. All questions answered.

## 2023-03-24 NOTE — Progress Notes (Signed)
Received call from pt's daughter requesting referral for second opinion at the [facility] at [facility] with Dr. Truett Perna. Referral placed. Dr. Cathie Hoops made aware.

## 2023-03-25 ENCOUNTER — Inpatient Hospital Stay: Payer: Medicare Other

## 2023-03-25 ENCOUNTER — Inpatient Hospital Stay: Payer: Medicare Other | Admitting: Internal Medicine

## 2023-03-28 ENCOUNTER — Telehealth: Payer: Self-pay | Admitting: Internal Medicine

## 2023-03-28 ENCOUNTER — Other Ambulatory Visit: Payer: Self-pay | Admitting: Nurse Practitioner

## 2023-03-28 ENCOUNTER — Other Ambulatory Visit: Payer: Self-pay | Admitting: Oncology

## 2023-03-28 ENCOUNTER — Other Ambulatory Visit: Payer: Self-pay | Admitting: Cardiology

## 2023-03-28 DIAGNOSIS — Z86711 Personal history of pulmonary embolism: Secondary | ICD-10-CM

## 2023-03-28 NOTE — Telephone Encounter (Signed)
Contacted patient to scheduled appointments. Left message with appointment details and a call back number if patient had any questions or could not accommodate the time we provided.   

## 2023-03-30 ENCOUNTER — Inpatient Hospital Stay: Payer: Medicare Other

## 2023-03-30 ENCOUNTER — Other Ambulatory Visit: Payer: Self-pay

## 2023-03-30 ENCOUNTER — Inpatient Hospital Stay (HOSPITAL_BASED_OUTPATIENT_CLINIC_OR_DEPARTMENT_OTHER): Payer: Medicare Other | Admitting: Internal Medicine

## 2023-03-30 VITALS — BP 105/68 | HR 91 | Temp 97.7°F | Resp 16 | Ht 61.0 in | Wt 106.6 lb

## 2023-03-30 DIAGNOSIS — M899 Disorder of bone, unspecified: Secondary | ICD-10-CM | POA: Diagnosis not present

## 2023-03-30 DIAGNOSIS — C3431 Malignant neoplasm of lower lobe, right bronchus or lung: Secondary | ICD-10-CM | POA: Diagnosis not present

## 2023-03-30 DIAGNOSIS — N289 Disorder of kidney and ureter, unspecified: Secondary | ICD-10-CM | POA: Diagnosis not present

## 2023-03-30 DIAGNOSIS — C3411 Malignant neoplasm of upper lobe, right bronchus or lung: Secondary | ICD-10-CM

## 2023-03-30 DIAGNOSIS — C787 Secondary malignant neoplasm of liver and intrahepatic bile duct: Secondary | ICD-10-CM

## 2023-03-30 DIAGNOSIS — C3491 Malignant neoplasm of unspecified part of right bronchus or lung: Secondary | ICD-10-CM

## 2023-03-30 DIAGNOSIS — Z87891 Personal history of nicotine dependence: Secondary | ICD-10-CM

## 2023-03-30 LAB — CMP (CANCER CENTER ONLY)
ALT: 27 U/L (ref 0–44)
AST: 22 U/L (ref 15–41)
Albumin: 3.8 g/dL (ref 3.5–5.0)
Alkaline Phosphatase: 62 U/L (ref 38–126)
Anion gap: 9 (ref 5–15)
BUN: 38 mg/dL — ABNORMAL HIGH (ref 8–23)
CO2: 26 mmol/L (ref 22–32)
Calcium: 9.9 mg/dL (ref 8.9–10.3)
Chloride: 102 mmol/L (ref 98–111)
Creatinine: 1.31 mg/dL — ABNORMAL HIGH (ref 0.44–1.00)
GFR, Estimated: 40 mL/min — ABNORMAL LOW (ref >60)
Glucose, Bld: 118 mg/dL — ABNORMAL HIGH (ref 70–99)
Potassium: 4.8 mmol/L (ref 3.5–5.1)
Sodium: 137 mmol/L (ref 135–145)
Total Bilirubin: 0.4 mg/dL (ref 0.3–1.2)
Total Protein: 7 g/dL (ref 6.5–8.1)

## 2023-03-30 LAB — CBC WITH DIFFERENTIAL (CANCER CENTER ONLY)
Abs Immature Granulocytes: 0.01 10*3/uL (ref 0.00–0.07)
Basophils Absolute: 0.1 10*3/uL (ref 0.0–0.1)
Basophils Relative: 1 %
Eosinophils Absolute: 0.1 10*3/uL (ref 0.0–0.5)
Eosinophils Relative: 1 %
HCT: 38 % (ref 36.0–46.0)
Hemoglobin: 11.7 g/dL — ABNORMAL LOW (ref 12.0–15.0)
Immature Granulocytes: 0 %
Lymphocytes Relative: 22 %
Lymphs Abs: 1 10*3/uL (ref 0.7–4.0)
MCH: 25.3 pg — ABNORMAL LOW (ref 26.0–34.0)
MCHC: 30.8 g/dL (ref 30.0–36.0)
MCV: 82.1 fL (ref 80.0–100.0)
Monocytes Absolute: 0.3 10*3/uL (ref 0.1–1.0)
Monocytes Relative: 7 %
Neutro Abs: 3.1 10*3/uL (ref 1.7–7.7)
Neutrophils Relative %: 69 %
Platelet Count: 342 10*3/uL (ref 150–400)
RBC: 4.63 MIL/uL (ref 3.87–5.11)
RDW: 20.8 % — ABNORMAL HIGH (ref 11.5–15.5)
WBC Count: 4.6 10*3/uL (ref 4.0–10.5)
nRBC: 0 % (ref 0.0–0.2)

## 2023-03-30 NOTE — Progress Notes (Signed)
[facility] [phone]   [phone]  CONSULT NOTE  REFERRING PHYSICIAN: Dr. Rickard Patience  REASON FOR CONSULTATION:  82 years old African-American female recently diagnosed with lung cancer.  HPI Brianna Adams is a 82 y.o. female with past medical history significant for multiple medical problems including congestive heart failure, COPD, diabetes mellitus, diverticulitis, GERD, history of pulmonary embolism, pulmonary hypertension, uterine cancer.  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  She also has history of COPD.  She had restaging scan of the chest on 01/12/2023 by Dr. Jayme Cloud and that showed central right upper lobe solid 3.0 cm mass compatible with malignancy favoring metachronous primary bronchogenic malignancy.  There was also new right paratracheal and subcarinal lymphadenopathy suspicious for nodal metastasis in addition to several at least 5 new hypodense liver metastasis scattered throughout the liver suspicious for liver metastasis..  The patient had a PET scan on 01/24/2023 and that showed 3.0 cm right upper lobe lung mass that is hypermetabolic consistent with primary lung neoplasm.  There was adjacent right paratracheal nodal disease and multiple hypermetabolic hepatic metastatic lesions.  There was also hypermetabolic right sixth posterior rib lesion suspicious for metastatic disease and 1.0 cm right lower lobe breast lesion that is hypermetabolic and likely small focus of breast cancer.  On Feb 03, 2023 the patient underwent ultrasound-guided core biopsy of the liver and the final pathology ([id number]) was consistent with metastatic adenocarcinoma of lung primary. The carcinoma is positive for cytokeratin 7 and TTF-1 and negative for cytokeratin 20, GATA3, CDX2, and p40.  Molecular studies by [facility] showed no  actionable mutations and PD-L1 expression of 0%. The patient was seen by Dr. Cathie Hoops who recommended for her palliative care and hospice but the patient requested a second opinion for discussion of her condition and to see if there is any other recommendation. When seen today she has significant shortness of breath.  Her oxygen saturation was down to 40% at some point because she was running out of oxygen and her oxygen tank but improved to lower 90% with oxygen 4 L/min.  The patient denied having any other significant complaints except for few pounds of weight loss.  She has no chest pain, cough or hemoptysis.  She has no nausea, vomiting, diarrhea or constipation.  She has no headache or visual changes. Family history significant for father with heart disease.  Mother had hypertension. The patient is a widow and has 1 daughter, Brianna Adams who accompanied her to the visit today.  She has a history for smoking for around 60 years and quit in 2017.  She has no history of alcohol or drug abuse.  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   PE (pulmonary thromboembolism) (HCC) 02/29/2020   Pulmonary hypertension (HCC)    Uterine cancer [facility])     Past Surgical History:  Procedure Laterality Date   ABDOMINAL HYSTERECTOMY     cataract surgery  2012  and 2017   COLONOSCOPY     FIBEROPTIC BRONCHOSCOPY     FLEXIBLE SIGMOIDOSCOPY N/A 08/13/2021   Procedure: FLEXIBLE SIGMOIDOSCOPY;  Surgeon: Imogene Burn, MD;  Location: [facility];  Service: Gastroenterology;  Laterality: N/A;   GANGLION CYST EXCISION Left 09/11/2020   Procedure: EXCISION VOLAR RADIAL GANGLION OF LEFT WRIST;  Surgeon:  Cindee Salt, MD;  Location: [facility] OR;  Service: Orthopedics;  Laterality: Left;  AXILLARY BLOCK   resection of lung cancer  2018   RIGHT HEART CATH N/A 10/20/2021   Procedure: RIGHT HEART CATH;  Surgeon: Marykay Lex, MD;  Location: [facility] INVASIVE CV LAB;  Service: Cardiovascular;  Laterality: N/A;   RIGHT HEART CATH N/A 03/18/2022   Procedure: RIGHT HEART CATH;  Surgeon: Laurey Morale, MD;  Location: [facility] INVASIVE CV LAB;  Service: Cardiovascular;  Laterality: N/A;   RIGHT HEART CATH N/A 06/04/2022   Procedure: RIGHT HEART CATH;  Surgeon: Laurey Morale, MD;  Location: [facility] INVASIVE CV LAB;  Service: Cardiovascular;  Laterality: N/A;   right lower lobe non-anatomocal lung resection wedge     right thorascoscopy      Family History  Problem Relation Age of Onset   Hypertension Mother    Heart attack Father    Cancer Maternal Aunt        unknown type   Stomach cancer Neg Hx    Colon cancer Neg Hx    Esophageal cancer Neg Hx    Pancreatic cancer Neg Hx     Social History Social History   Tobacco Use   Smoking status: Former    Current packs/day: 0.00    Average packs/day: 1 pack/day for 58.0 years (58.0 ttl pk-yrs)    Types: Cigarettes    Start date: 10/21/1957    Quit date: 10/22/2015    Years since quitting: 7.4    Passive exposure: Past   Smokeless tobacco: Never   Tobacco comments:    congratulated  Vaping Use   Vaping status: Never Used  Substance Use Topics   Alcohol use: Never   Drug use: Never    Allergies  Allergen Reactions   Ace Inhibitors Swelling   Penicillin G Hives, Shortness Of Breath and Rash   Zestril [Lisinopril] Swelling   Cortisone Other (See Comments)    Unknown reaction   Hydrocortisone Other (See Comments)    Unknown reaction   Lipitor [Atorvastatin] Other (See Comments)    Myalgias (Muscle Pain) in high dosages     Mobic [Meloxicam] Other (See Comments)    Bloody stools   Pravachol [Pravastatin] Other (See Comments)    Chest Pain if high  dosage     Current Outpatient Medications  Medication Sig Dispense Refill   acetaminophen (TYLENOL) 500 MG tablet Take 1,000 mg by mouth every 6 (six) hours as needed for mild pain or fever.     albuterol (VENTOLIN HFA) 108 (90 Base) MCG/ACT inhaler INHALE 2 PUFFS BY MOUTH EVERY 6 HOURS AS NEEDED FOR WHEEZE OR SHORTNESS OF BREATH 8.5 each 5   beta carotene w/minerals (OCUVITE) tablet Take 1 tablet by mouth in the morning.     Budeson-Glycopyrrol-Formoterol (BREZTRI AEROSPHERE) 160-9-4.8 MCG/ACT AERO Inhale 2 puffs into the lungs in the morning and at bedtime. 10.7 g 12   CALCIUM PO Take 1,200 mg by mouth in the morning.     Cinnamon 500 MG TABS Take 1,000 mg by mouth at bedtime.     ELIQUIS 2.5 MG TABS tablet TAKE ONE TABLET BY  MOUTH TWO TIMES A DAY 180 tablet 0   ferrous sulfate 325 (65 FE) MG EC tablet Take 1 tablet (325 mg total) by mouth 2 (two) times daily with a meal. 180 tablet 1   JARDIANCE 10 MG TABS tablet TAKE ONE TABLET BY MOUTH EVERY DAY FOR BREAKFAST 30 tablet 0   metFORMIN (GLUCOPHAGE) 1000 MG tablet TAKE 1 TABLET (1,000 MG TOTAL) BY MOUTH TWICE A DAY WITH FOOD 180 tablet 1   Multiple Vitamins-Minerals (CENTRUM SILVER 50+WOMEN PO) Take 1 tablet by mouth in the morning.     omeprazole (PRILOSEC) 40 MG capsule TAKE 1 CAPSULE BY MOUTH EVERY DAY 90 capsule 1   potassium chloride SA (KLOR-CON M) 20 MEQ tablet TAKE ONE TABLET BY MOUTH EVERYY DAY 90 tablet 0   pravastatin (PRAVACHOL) 10 MG tablet TAKE 1 TABLET BY MOUTH EVERY DAY 90 tablet 1   Pseudoephedrine-guaiFENesin (MUCINEX D PO) Take 5 mLs by mouth at bedtime.     Spacer/Aero-Holding Rudean Curt Use with inhaler 1 each 2   tadalafil, PAH, (ADCIRCA) 20 MG tablet Take 2 tablets (40 mg total) by mouth daily. 180 tablet 3   torsemide (DEMADEX) 20 MG tablet TAKE 1 TABLET BY MOUTH EVERY DAY 60 tablet 2   Treprostinil (TYVASO DPI MAINTENANCE KIT) 16 MCG POWD Inhale 16 mcg into the lungs in the morning, at noon, in the evening, and at  bedtime. 112 each 11   vitamin C (ASCORBIC ACID) 500 MG tablet Take 500 mg by mouth every Tuesday, Thursday, Saturday, and Sunday. In the morning.     vitamin E 180 MG (400 UNITS) capsule Take 400 Units by mouth every Monday, Wednesday, and Friday. In the morning.     No current facility-administered medications for this visit.    Review of Systems  Constitutional: positive for fatigue and weight loss Eyes: negative Ears, nose, mouth, throat, and face: negative Respiratory: positive for dyspnea on exertion Cardiovascular: negative Gastrointestinal: negative Genitourinary:negative Integument/breast: negative Hematologic/lymphatic: negative Musculoskeletal:positive for muscle weakness Neurological: negative Behavioral/Psych: negative Endocrine: negative Allergic/Immunologic: negative  Physical Exam  ZOX:WRUEA, healthy, no distress, well nourished, and well developed SKIN: skin color, texture, turgor are normal, no rashes or significant lesions HEAD: Normocephalic, No masses, lesions, tenderness or abnormalities EYES: normal, PERRLA, Conjunctiva are pink and non-injected EARS: External ears normal, Canals clear OROPHARYNX:no exudate, no erythema, and lips, buccal mucosa, and tongue normal  NECK: supple, no adenopathy, no JVD LYMPH:  no palpable lymphadenopathy, no hepatosplenomegaly BREAST:not examined LUNGS: intercostal retractions, prolonged expiratory phase HEART: regular rate & rhythm, no murmurs, and no gallops ABDOMEN:abdomen soft, non-tender, normal bowel sounds, and no masses or organomegaly BACK: Back symmetric, no curvature., No CVA tenderness EXTREMITIES:no joint deformities, effusion, or inflammation, no edema  NEURO: alert & oriented x 3 with fluent speech, no focal motor/sensory deficits  PERFORMANCE STATUS: ECOG 1  LABORATORY DATA: Lab Results  Component Value Date   WBC 4.6 03/30/2023   HGB 11.7 (L) 03/30/2023   HCT 38.0 03/30/2023   MCV 82.1 03/30/2023    PLT 342 03/30/2023      Chemistry      Component Value Date/Time   NA 134 (L) 02/03/2023 0841   NA 140 07/06/2022 1307   K 5.0 02/03/2023 0841   CL 98 02/03/2023 0841   CO2 23 02/03/2023 0841   BUN 48 (H) 02/03/2023 0841   BUN 26 07/06/2022 1307   CREATININE 1.54 (H) 02/03/2023 0841      Component Value Date/Time   CALCIUM  9.6 02/03/2023 0841   ALKPHOS 55 02/03/2023 0841   AST 33 02/03/2023 0841   ALT 22 02/03/2023 0841   BILITOT 0.4 02/03/2023 0841   BILITOT 0.3 03/03/2021 1150       RADIOGRAPHIC STUDIES: No results found.  ASSESSMENT: This is a very pleasant 81 years old African-American female recently diagnosed with a stage IV (T2a, N2, M1 C) non-small cell lung cancer, adenocarcinoma presented with right upper lobe lung mass in addition to mediastinal lymphadenopathy and multiple liver metastases as well as right sixth rib bone lesion diagnosed in May 2024.  The patient also has a history of squamous cell carcinoma of the right lung status post wedge resection as well as SBRT to left lung lesions when she was in [place]. Her molecular studies were negative for any actionable mutations and has negative PD-L1 expression.  PLAN: I had a lengthy discussion with the patient and her daughter today about her current condition and possible treatment options.  I personally and independently reviewed the imaging studies and discussed the results with the patient and her daughter. Unfortunately the patient has very poor performance status and a lot of comorbidities and renal insufficiency. I discussed with the patient her condition and she understands that she has incurable condition and all the treatment will be of palliative nature. I gave the patient the option of palliative care and hospice referral but other treatment options could include a combination of chemoimmunotherapy.  This will be very harsh on the patient with her current condition and she is not interested in  chemotherapy anyway. I strongly recommend for her to consider proceeding with the hospice care as recommended by Dr. Cathie Hoops. The patient and her daughter are in agreement with the current plan. The patient will continue any future follow-up visit with Dr. Cathie Hoops. She was advised to call if I can help in any way. The patient voices understanding of current disease status and treatment options and is in agreement with the current care plan.  All questions were answered. The patient knows to call the clinic with any problems, questions or concerns. We can certainly see the patient much sooner if necessary.  Thank you so much for allowing me to participate in the care of Brianna Adams. I will continue to follow up the patient with you and assist in her care.  The total time spent in the appointment was 60 minutes.  Disclaimer: This note was dictated with voice recognition software. Similar sounding words can inadvertently be transcribed and may not be corrected upon review.   Lajuana Matte March 30, 2023, 11:57 AM

## 2023-04-01 ENCOUNTER — Encounter: Payer: Self-pay | Admitting: Pulmonary Disease

## 2023-04-01 ENCOUNTER — Encounter: Payer: Self-pay | Admitting: Cardiology

## 2023-04-01 ENCOUNTER — Encounter: Payer: Self-pay | Admitting: Nurse Practitioner

## 2023-04-01 ENCOUNTER — Telehealth (HOSPITAL_COMMUNITY): Payer: Self-pay | Admitting: Cardiology

## 2023-04-01 NOTE — Telephone Encounter (Signed)
That is ok to do if she is entering hospice.

## 2023-04-01 NOTE — Telephone Encounter (Signed)
Brianna Adams with Hospice called to request order to discontinue all pulm HTN meds  -unable to accept into hospice services while on these meds as hospice will not pay. Family unable to afford.    Currently taking: Tyvaso Adcirca

## 2023-04-01 NOTE — Telephone Encounter (Signed)
I think at this point we can hold off on those medications.  We can use the oxygen to help with her pulmonary artery pressure.  Also some of the medicines that she can get through hospice like morphine actually help the heart to relax.  And this may help her.  Lets try to see how she does off of them.

## 2023-04-01 NOTE — Telephone Encounter (Signed)
Lisa aware. 

## 2023-04-04 NOTE — Telephone Encounter (Signed)
Called to get update of patient being admitted to hospice care on 04/01/2023. She was not a candidate for immunotherapy and chemo. Dgt reports patient is in good spirits. Talked with her about not taking jardiance and due to age and HgbA1c was 7.6 the goal is to treat symptom management and comfort. Hospice may check blood sugar and treat as needed if elevated. She is to confirm with hospice provider as they will be primary provider moving forward.

## 2023-04-14 ENCOUNTER — Encounter: Payer: Self-pay | Admitting: Oncology

## 2023-04-14 ENCOUNTER — Encounter: Payer: Self-pay | Admitting: Cardiology

## 2023-04-19 ENCOUNTER — Inpatient Hospital Stay: Payer: Medicare Other | Admitting: Oncology

## 2023-04-19 ENCOUNTER — Inpatient Hospital Stay: Payer: Medicare Other

## 2023-04-20 ENCOUNTER — Other Ambulatory Visit: Payer: Medicare Other

## 2023-04-20 ENCOUNTER — Ambulatory Visit: Payer: Medicare Other | Admitting: Oncology

## 2023-04-25 ENCOUNTER — Ambulatory Visit: Payer: Medicare Other | Admitting: Cardiology

## 2023-04-25 ENCOUNTER — Encounter: Payer: Self-pay | Admitting: Cardiology

## 2023-04-25 VITALS — BP 99/62 | HR 86 | Resp 16 | Wt 100.0 lb

## 2023-04-25 DIAGNOSIS — I5032 Chronic diastolic (congestive) heart failure: Secondary | ICD-10-CM | POA: Diagnosis not present

## 2023-04-25 MED ORDER — TORSEMIDE 20 MG PO TABS: 20.0000 mg | ORAL_TABLET | Freq: Every day | ORAL | 1 refills | Status: DC | PRN

## 2023-04-25 NOTE — Progress Notes (Signed)
PCP: Arnette Felts, [name] Cardiology: Dr. Bjorn Pippin HF Cardiology: Dr. Shirlee Latch  82 y.o. with history of COPD/emphysema, prior PE in 6/21, lung cancer, and pulmonary hypertension was referred by Dr. Bjorn Pippin for evaluation of pulmonary hypertension. Patient had right lower lobe lung resection for cancer in 2018 then radiation on the left side in 2019.  CT chest in 5/23 showed advanced emphysema with radiation scarring left upper lobe. She is on home oxygen 4L [place].  Patient was admitted in 4/22 with CHF, echo at that time showed EF 55-60%, moderate RV enlargement with PASP 85 mmHg.  Echo repeated in 2/23 showed EF 60-65%, mild LVH, D-shaped septum, moderate RV dysfunction and moderate RV enlargement, PASP 90 mmHg, mild-moderate TR.  Subsequently RHC was done in 2/23 showing normal RA pressure but significantly elevated PCWP and severe pulmonary hypertension.  This appeared to be mixed pulmonary venous/pulmonary arterial hypertension.   RHC 7/23 showed normal filling pressures and severe pulmonary arterial hypertension. V/Q scan was negative for chronic PEs.   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  Echo bubble study was positive.  Repeat RHC showed moderate-severe PAH with no step up in oxygen saturation to suggest significant left=>right shunting.    She was seen by rheumatology (I do not have a copy of the notes); daughter says she was not thought to have a rheumatologic diagnosis.   We tried her on Tyvaso, but she felt worse on this and is off.    In 5/24, she was found to have RUL lung mass with mediastinal lymphadenopathy and liver mets.  Liver biopsy showed lung adenocarcinoma.  She is now under hospice care.    She returns for followup of RV failure/pulmonary hypertension with her daughter.  She remains on 4L .  Same shortness of breath, dyspnea walking across house.  No chest pain or lightheadedness.  Still able to get to church. Looking forward to a visit by friends from [place].   Labs  (5/23): K 5.1, creatinine 0.93 Labs (8/23): K 4.0, creatinine 1.02 Labs (10/23): K 3.9, creatinine 1.09 Labs (1/24): BNP 396, K 4.6, creatinine 1.46 Labs (2/24): K 4.0, creatinine 1.32 Labs (3/24): BNP 233, K 3.7, creatinine 1.4 Labs (7/24): K 4.8, creatinine 1.31  PMH: 1. COPD: Emphysema.  Prior smoker.  - on home oxygen - CT chest (5/23): Advanced emphysema, radiation scarring left upper lobe.  2. H/o PE in 6/21 3. Type 2 diabetes  4. Lung cancer s/p RLL resection in 2018 and radiation in 2019.  - Recurrence as metastatic disease in 5/24, she was found to have RUL lung mass with mediastinal lymphadenopathy and liver mets.  Liver biopsy showed lung adenocarcinoma.  5. Pulmonary hypertension: Echo (4/22) with EF 55-60%, moderate RV enlargement with PASP 85 mmHg.  - Echo (2/23) with EF 60-65%, mild LVH, D-shaped septum, moderate RV dysfunction and moderate RV enlargement, PASP 90 mmHg, mild-moderate TR.  - RHC (2/23): mean RA 2, PA 75/21 mean 42, mean PCWP 24, CI 3.37, PVR 3.3 WU - RHC (7/23): mean RA 6, PA 77/26, mean 44, mean PCWP 7, CI 2.37, PVR 10 WU - V/Q scan (7/23): No chronic PE - ANA+, anti-RNP+ - RHC (9/23): mean RA 4, PA 68/23 mean 40, mean PCWP 11, CI 2.12, PVR 8.6 WU.  No step-up in oxygen saturation on shunt run.  - Echo (9/23): EF 55-60%, positive bubble study, D-shaped septum with moderate RV enlargement and moderately decreased RV systolic function, PASP 64 mmHg.   ROS: All systems reviewed  and negative except as per HPI.   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, broadcasting/film/video  Tobacco Use   Smoking status: Former    Current packs/day: 0.00    Average packs/day: 1 pack/day for 58.0 years (58.0 ttl pk-yrs)     Types: Cigarettes    Start date: 10/21/1957    Quit date: 10/22/2015    Years since quitting: 7.5    Passive exposure: Past   Smokeless tobacco: Never   Tobacco comments:    congratulated  Vaping Use   Vaping status: Never Used  Substance and Sexual Activity   Alcohol use: Never   Drug use: Never   Sexual activity: Not Currently    Birth control/protection: Surgical    Comment: HYSTERECTOMY  Other Topics Concern   Not on file  Social History Narrative   Not on file   Social Determinants of Health   Financial Resource Strain: Low Risk  (01/27/2023)   Overall Financial Resource Strain (CARDIA)    Difficulty of Paying Living Expenses: Not hard at all  Food Insecurity: No Food Insecurity (01/27/2023)   Hunger Vital Sign    Worried About Running Out of Food in the Last Year: Never true    Ran Out of Food in the Last Year: Never true  Transportation Needs: No Transportation Needs (01/27/2023)   PRAPARE - Administrator, Civil Service (Medical): No    Lack of Transportation (Non-Medical): No  Physical Activity: Insufficiently Active (01/27/2023)   Exercise Vital Sign    Days of Exercise per Week: 2 days    Minutes of Exercise per Session: 60 min  Stress: No Stress Concern Present (01/27/2023)   [employer] of Occupational Health - Occupational Stress Questionnaire    Feeling of Stress : Not at all  Social Connections: Not on file  Intimate Partner Violence: Not At Risk (06/02/2022)   Humiliation, Afraid, Rape, and Kick questionnaire    Fear of Current or Ex-Partner: No    Emotionally Abused: No    Physically Abused: No    Sexually Abused: No   Family History  Problem Relation Age of Onset   Hypertension Mother    Heart attack Father    Cancer Maternal Aunt        unknown type   Stomach cancer Neg Hx    Colon cancer Neg Hx    Esophageal cancer Neg Hx    Pancreatic cancer Neg Hx    Current Outpatient Medications  Medication Sig Dispense Refill    acetaminophen (TYLENOL) 500 MG tablet Take 1,000 mg by mouth every 6 (six) hours as needed for mild pain or fever.     albuterol (VENTOLIN HFA) 108 (90 Base) MCG/ACT inhaler INHALE 2 PUFFS BY MOUTH EVERY 6 HOURS AS NEEDED FOR WHEEZE OR SHORTNESS OF BREATH 8.5 each 5   beta carotene w/minerals (OCUVITE) tablet Take 1 tablet by mouth in the morning.     Budeson-Glycopyrrol-Formoterol (BREZTRI AEROSPHERE) 160-9-4.8 MCG/ACT AERO Inhale 2 puffs into the lungs in the morning and at bedtime. 10.7 g 12   CALCIUM PO Take 1,200 mg by mouth in the morning.     Cinnamon 500 MG TABS Take 1,000 mg  by mouth at bedtime.     ELIQUIS 2.5 MG TABS tablet TAKE ONE TABLET BY MOUTH TWO TIMES A DAY 180 tablet 0   ferrous sulfate 325 (65 FE) MG EC tablet Take 1 tablet (325 mg total) by mouth 2 (two) times daily with a meal. (Patient taking differently: Take 1 tablet by mouth daily with breakfast.) 180 tablet 1   JARDIANCE 10 MG TABS tablet TAKE ONE TABLET BY MOUTH EVERY DAY FOR BREAKFAST 30 tablet 0   metFORMIN (GLUCOPHAGE) 1000 MG tablet TAKE 1 TABLET (1,000 MG TOTAL) BY MOUTH TWICE A DAY WITH FOOD 180 tablet 1   Multiple Vitamins-Minerals (CENTRUM SILVER 50+WOMEN PO) Take 1 tablet by mouth in the morning.     omeprazole (PRILOSEC) 40 MG capsule TAKE 1 CAPSULE BY MOUTH EVERY DAY 90 capsule 1   potassium chloride SA (KLOR-CON M) 20 MEQ tablet TAKE ONE TABLET BY MOUTH EVERYY DAY 90 tablet 0   pravastatin (PRAVACHOL) 10 MG tablet TAKE 1 TABLET BY MOUTH EVERY DAY 90 tablet 1   Pseudoephedrine-guaiFENesin (MUCINEX D PO) Take 5 mLs by mouth at bedtime.     Spacer/Aero-Holding Rudean Curt Use with inhaler 1 each 2   torsemide (DEMADEX) 20 MG tablet TAKE 1 TABLET BY MOUTH EVERY DAY (Patient taking differently: Take 30 mg by mouth daily.) 60 tablet 2   torsemide (DEMADEX) 20 MG tablet Take 1 tablet (20 mg total) by mouth daily as needed. 90 tablet 1   vitamin C (ASCORBIC ACID) 500 MG tablet Take 500 mg by mouth every Tuesday,  Thursday, Saturday, and Sunday. In the morning.     vitamin E 180 MG (400 UNITS) capsule Take 400 Units by mouth every Monday, Wednesday, and Friday. In the morning.     No current facility-administered medications for this visit.   Wt Readings from Last 3 Encounters:  04/25/23 100 lb (45.4 kg)  03/30/23 106 lb 9.6 oz (48.4 kg)  03/22/23 106 lb 1.6 oz (48.1 kg)   BP 99/62 (BP Location: Right Arm, Patient Position: Sitting, Cuff Size: Normal)   Pulse 86   Resp 16   Wt 100 lb (45.4 kg)   SpO2 92% Comment: 4 liter oxygen, chronic  BMI 18.89 kg/m  PHYSICAL EXAM: General: Thin, frail Neck: No JVD, no thyromegaly or thyroid nodule.  Lungs: Distant breath sounds.  CV: Nondisplaced PMI.  Heart regular S1/S2, no S3/S4, no murmur.  No peripheral edema.  No carotid bruit.  Normal pedal pulses.  Abdomen: Soft, nontender, no hepatosplenomegaly, no distention.  Skin: Intact without lesions or rashes.  Neurologic: Alert and oriented x 3.  Psych: Normal affect. Extremities: No clubbing or cyanosis.  HEENT: Normal.   Assessment/Plan:  1. RV failure: Echo in 2/23 showed EF 60-65%, mild LVH, D-shaped septum, moderate RV dysfunction and moderate RV enlargement, PASP 90 mmHg, mild-moderate TR.  RHC in 7/23 showed normal filling pressures but severe PAH. Echo in 9/23 with  EF 55-60%, D-shaped septum, moderate RV enlargement with moderately decreased systolic function, bubble study positive, PASP 64 mmHg. NYHA class IIIb (stable), very frail. She does not look volume overloaded on exam.  - Continue Torsemide to 30 mg daily. BMET/BNP today.  - Continue Jardiance 10 mg daily.  2. COPD: She no longer smokes.  She has advanced emphysema by CT. She is on home oxygen 4 L (stable)  3. H/o PE: She is on Eliquis at 2.5 mg bid (dosed lower b/c of age, weight).  4. Pulmonary hypertension: Echo in 2/23  showed EF 60-65%, mild LVH, D-shaped septum, moderate RV dysfunction and moderate RV enlargement, PASP 90 mmHg,  mild-moderate TR. RHC 7/23 showed normal right and left filling pressures and severe pulmonary arterial hypertension with PVR 10 WU.  V/Q scan not suggestive of chronic PEs.  Severe emphysema by chest CT.  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 Referred to rheumatology, saw Dr. Dierdre Forth and says that she was not given a specific diagnosis. RHC in 9/23 showed moderate to severe PAH with PVR 8.6 and CI low but not markedly low at 2.1, RA and PCWP were not elevated. Given concern for possible group 1 component, tadalafil and Tyvaso were started.  She does not feel like either helped.  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 She is off Tyvaso.  - Stop tadalafil, cannot continue this with hospice and think it is not helping her.  5. Positive bubble study: Suspect PFO.  Shunt run done with RHC in 9/23, no step up. This does not suggest the presence of a significant ASD.  6. Hospice care. We can see to try to keep volume status stable.   Followup in 3 months.   Marca Ancona,  04/25/2023

## 2023-04-25 NOTE — Patient Instructions (Addendum)
Medication Changes:  PRN (As needed) lasix 20 mg tablets sent to [facility] in [place], per request. Stop tadalafil   Lab Work:  Labs done today, your results will be available in MyChart, we will contact you for abnormal readings.    Special Instructions // Education:  Do the following things EVERYDAY: Weigh yourself in the morning before breakfast. Write it down and keep it in a log. Take your medicines as prescribed Eat low salt foods--Limit salt (sodium) to 2000 mg per day.  Stay as active as you can everyday Limit all fluids for the day to less than 2 liters   Follow-Up in: 3 months. Please call the office to schedule this appointment.    If you have any questions or concerns before your next appointment please send [place] a message through [place] or call our office at [phone] Monday-Friday 8 am-5 pm.   If you have an urgent need after hours on the weekend please call your Primary Cardiologist or the [facility] in [place] at [phone].

## 2023-04-26 ENCOUNTER — Telehealth (HOSPITAL_COMMUNITY): Payer: Self-pay

## 2023-04-26 ENCOUNTER — Encounter: Payer: Self-pay | Admitting: Pulmonary Disease

## 2023-04-26 ENCOUNTER — Ambulatory Visit (INDEPENDENT_AMBULATORY_CARE_PROVIDER_SITE_OTHER): Payer: Medicare Other | Admitting: Pulmonary Disease

## 2023-04-26 VITALS — BP 110/60 | HR 72 | Temp 96.9°F | Ht 61.0 in | Wt 100.0 lb

## 2023-04-26 DIAGNOSIS — J9611 Chronic respiratory failure with hypoxia: Secondary | ICD-10-CM

## 2023-04-26 DIAGNOSIS — J4489 Other specified chronic obstructive pulmonary disease: Secondary | ICD-10-CM | POA: Diagnosis not present

## 2023-04-26 DIAGNOSIS — I5032 Chronic diastolic (congestive) heart failure: Secondary | ICD-10-CM

## 2023-04-26 DIAGNOSIS — C3491 Malignant neoplasm of unspecified part of right bronchus or lung: Secondary | ICD-10-CM

## 2023-04-26 DIAGNOSIS — J439 Emphysema, unspecified: Secondary | ICD-10-CM

## 2023-04-26 DIAGNOSIS — I272 Pulmonary hypertension, unspecified: Secondary | ICD-10-CM | POA: Diagnosis not present

## 2023-04-26 LAB — BASIC METABOLIC PANEL
BUN/Creatinine Ratio: 38 — ABNORMAL HIGH (ref 12–28)
BUN: 50 mg/dL — ABNORMAL HIGH (ref 8–27)
CO2: 17 mmol/L — ABNORMAL LOW (ref 20–29)
Calcium: 10.4 mg/dL — ABNORMAL HIGH (ref 8.7–10.3)
Chloride: 98 mmol/L (ref 96–106)
Creatinine, Ser: 1.31 mg/dL — ABNORMAL HIGH (ref 0.57–1.00)
Glucose: 126 mg/dL — ABNORMAL HIGH (ref 70–99)
Sodium: 134 mmol/L (ref 134–144)
eGFR: 40 mL/min/{1.73_m2} — ABNORMAL LOW (ref >59)

## 2023-04-26 LAB — BRAIN NATRIURETIC PEPTIDE: BNP: 747.1 pg/mL — ABNORMAL HIGH (ref 0.0–100.0)

## 2023-04-26 MED ORDER — ALBUTEROL SULFATE HFA 108 (90 BASE) MCG/ACT IN AERS: INHALATION_SPRAY | RESPIRATORY_TRACT | 5 refills | Status: DC

## 2023-04-26 NOTE — Telephone Encounter (Signed)
Patient advised and verbalized understanding,lab appointment scheduled,lab orders entered.  Orders Placed This Encounter  Procedures   Basic metabolic panel    Standing Status:   Future    Standing Expiration Date:   04/25/2024    Order Specific Question:   Release to patient    Answer:   Immediate    Order Specific Question:   Release to patient    Answer:   Immediate [1]

## 2023-04-26 NOTE — Telephone Encounter (Signed)
-----   Message from Marca Ancona sent at 04/26/2023  2:41 PM EDT ----- K hemolyzed, would get repeat BMET if possible from her.

## 2023-04-26 NOTE — Patient Instructions (Signed)
Continue using your nebulizer medication.  You can use it up to 4 times a day if you have increasing shortness of breath.  Continue using your oxygen.  We refilled your albuterol inhaler (rescue inhaler) which is the inhaler that you can take with you if you are outside of the home and not near the nebulizer.  We will keep your appointment here to as needed.  The hospice nurses are good at contacting us if there is any respiratory issues that we need to address.

## 2023-04-26 NOTE — Progress Notes (Signed)
Subjective:    Patient ID: Brianna Adams, female    DOB: Aug 25, 1941, 82 y.o.   MRN: [id number]  Patient Care Team: Arnette Felts, [name] as PCP - General (General Practice) Rickard Patience, MD as Consulting Physician (Oncology) Salena Saner, MD as Consulting Physician (Pulmonary Disease) Glory Buff, RN as Oncology Nurse Navigator  Chief Complaint  Patient presents with   Follow-up    Increased DOE. No wheezing or cough.   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  She has severe pulmonary hypertension with prior negative connective tissue disease workup.  She had been on Tyvaso and tadalafil for pulmonary hypertension and follows with the advanced heart failure clinic but these medications were discontinued yesterday as the patient entered hospice care.  Recall she has a prior history of right lower lobe and right middle lobe wedge resections on 25 Jan 2017 for a squamous cell carcinoma of the lung, this was done at the [facility].  She did not receive further therapy as she did clear margins tumor was noted to be pT2 pN0 pMX..  Subsequently after that in 2019 she had a left upper lobe lung nodule that was PET positive and was presumed malignant.  She underwent empiric SBRT to this lesion.  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  She is not a candidate for chemotherapy given poor performance status and multiple comorbidities.  Her care has been complicated by very severe pulmonary hypertension and respiratory failure with hypoxia.  She is dependent and on oxygen at 4 L/min though occasionally with exertion has to go up to 5 L/min.  As noted she is being followed at the advanced failure clinic for her pulmonary hypertension.  She saw Dr. Marca Ancona yesterday in this regard.  It was decided to discontinue Tyvaso and  tadalafil as she did not note any significant symptom improvement with these medications and as she is entering hospice care.  Today she presents with no new complaint.  She is compliant with oxygen at 4 L/min.  She has not had any recent fevers, chills or sweats.  Dyspnea is at baseline and unchanged.  Does not notice wheezing or cough.  She does not endorse any other symptomatology.  She presents with her daughter who helps with her care.  Brianna Adams does not note much improvement on her symptoms with Breztri and at this point we can discontinue this medication.  She does get relief from nebulizers and will continue DuoNeb 4 times a day as needed and albuterol as needed.  She is looking forward to visit from her friends in [place], they will be here at the end of the week.  Her goal is to continue to go to church as able.    Review of Systems A 10 point review of systems was performed and it is as noted above otherwise negative.   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   Hemorrhoids 12/01/2022   Hyperplastic polyp of intestine 12/01/2022   Lumbosacral radiculopathy 12/01/2022   Migraine 12/01/2022   Uterine leiomyoma 12/01/2022   Acute on chronic respiratory failure with hypoxia (HCC) 06/02/2022   Acute renal  failure superimposed on stage 3a chronic kidney disease (HCC) 06/02/2022   Rectal bleeding    Mild non proliferative diabetic retinopathy (HCC) 08/05/2021   Encounter for preoperative pulmonary examination 07/21/2021   IDA (iron deficiency anemia) 06/24/2021   Squamous cell carcinoma lung, unspecified laterality (HCC) 06/04/2021   Pneumonia of right upper lobe due to infectious organism 03/09/2021   CKD (chronic kidney disease), stage II 03/09/2021   Chronic  diastolic CHF (congestive heart failure) (HCC) 03/09/2021   HTN (hypertension) 03/09/2021   HLD (hyperlipidemia) 03/09/2021   COPD with acute exacerbation (HCC) 03/09/2021   Elevated troponin 03/09/2021   Type 2 diabetes mellitus with hyperlipidemia (HCC) 03/09/2021   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/31/2020   Acute on chronic heart failure with preserved ejection fraction (HFpEF) (HCC) 12/09/2020   Lymphadenopathy    Pulmonary embolism (HCC) 02/29/2020   Chronic respiratory failure with hypoxia (HCC) 02/18/2020   Pulmonary nodule 02/18/2020   GERD (gastroesophageal reflux disease) 02/18/2020   Pulmonary hypertension, unspecified (HCC) 10/04/2019   COPD with chronic bronchitis and emphysema (HCC) 03/14/2019   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) 03/14/2019   Mass of thyroid gland 12/07/2016   Adrenal adenoma 11/18/2016   Non-toxic multinodular goiter 11/18/2016   Mass of left adrenal gland (HCC) 11/02/2016   Cataract 10/21/2016   Paresthesia of lower extremity 08/05/2016   Trochanteric bursitis of right hip 06/06/2016   Osteopenia 05/24/2016   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) 08/06/2014   Ventricular bigeminy 02/05/2011   Seizure (HCC) 09/06/1949    Social History   Tobacco Use   Smoking status: Former    Current packs/day: 0.00    Average packs/day: 1 pack/day for 58.0 years (58.0 ttl pk-yrs)    Types: Cigarettes    Start date: 10/21/1957    Quit date: 10/22/2015    Years since quitting: 7.5    Passive exposure: Past   Smokeless tobacco: Never   Tobacco comments:    congratulated  Substance Use Topics   Alcohol use: Never    Allergies  Allergen Reactions   Ace Inhibitors Swelling   Penicillin G Hives, Shortness Of Breath and Rash   Zestril [Lisinopril] Swelling   Cortisone Other (See Comments)    Unknown reaction   Hydrocortisone Other (See Comments)    Unknown reaction   Lipitor [Atorvastatin] Other (See Comments)     Myalgias (Muscle Pain) in high dosages     Mobic [Meloxicam] Other (See Comments)    Bloody stools   Pravachol [Pravastatin] Other (See Comments)    Chest Pain if high dosage     Current Meds  Medication Sig   acetaminophen (TYLENOL) 500 MG tablet Take 1,000 mg by mouth every 6 (six) hours as needed for mild pain or fever.   albuterol (VENTOLIN HFA) 108 (90 Base) MCG/ACT inhaler INHALE 2 PUFFS BY MOUTH EVERY 6 HOURS AS NEEDED FOR WHEEZE OR SHORTNESS OF BREATH   beta carotene w/minerals (OCUVITE) tablet Take 1 tablet by mouth in the morning.   Budeson-Glycopyrrol-Formoterol (BREZTRI AEROSPHERE) 160-9-4.8 MCG/ACT AERO Inhale 2 puffs into the lungs in the morning and at bedtime.   CALCIUM PO Take 1,200 mg by mouth in the morning.   Cinnamon 500 MG TABS Take 1,000 mg by mouth at bedtime.   ELIQUIS 2.5 MG TABS tablet TAKE ONE TABLET BY MOUTH TWO TIMES A DAY   ferrous sulfate 325 (65 FE) MG EC tablet Take 1 tablet (325 mg total) by mouth  2 (two) times daily with a meal. (Patient taking differently: Take 1 tablet by mouth daily with breakfast.)   ipratropium-albuterol (DUONEB) 0.5-2.5 (3) MG/3ML SOLN Take 3 mLs by nebulization every 4 (four) hours as needed.   JARDIANCE 10 MG TABS tablet TAKE ONE TABLET BY MOUTH EVERY DAY FOR BREAKFAST   metFORMIN (GLUCOPHAGE) 1000 MG tablet TAKE 1 TABLET (1,000 MG TOTAL) BY MOUTH TWICE A DAY WITH FOOD   Multiple Vitamins-Minerals (CENTRUM SILVER 50+WOMEN PO) Take 1 tablet by mouth in the morning.   omeprazole (PRILOSEC) 40 MG capsule TAKE 1 CAPSULE BY MOUTH EVERY DAY   potassium chloride SA (KLOR-CON M) 20 MEQ tablet TAKE ONE TABLET BY MOUTH EVERYY DAY   pravastatin (PRAVACHOL) 10 MG tablet TAKE 1 TABLET BY MOUTH EVERY DAY   Pseudoephedrine-guaiFENesin (MUCINEX D PO) Take 5 mLs by mouth at bedtime.   Spacer/Aero-Holding Rudean Curt Use with inhaler   torsemide (DEMADEX) 20 MG tablet TAKE 1 TABLET BY MOUTH EVERY DAY (Patient taking differently: Take 30 mg by  mouth daily.)   torsemide (DEMADEX) 20 MG tablet Take 1 tablet (20 mg total) by mouth daily as needed.   vitamin C (ASCORBIC ACID) 500 MG tablet Take 500 mg by mouth every Tuesday, Thursday, Saturday, and Sunday. In the morning.   vitamin E 180 MG (400 UNITS) capsule Take 400 Units by mouth every Monday, Wednesday, and Friday. In the morning.    Immunization History  Administered Date(s) Administered   Fluad Quad(high Dose 65+) 05/24/2019, 05/13/2020, 06/18/2022   Influenza, High Dose Seasonal PF 06/01/2021   Influenza, Quadrivalent, Recombinant, Inj, Pf 07/15/2017   Influenza,inj,quad, With Preservative 05/29/2015, 06/06/2016, 06/06/2018   Influenza-Unspecified 06/06/2018, 06/18/2022   Moderna Covid-19 Vaccine Bivalent Booster 61yrs & up 04/24/2022   PFIZER(Purple Top)SARS-COV-2 Vaccination 10/20/2019, 11/19/2019, 06/11/2020   PNEUMOCOCCAL CONJUGATE-20 01/25/2023   Pfizer Covid-19 Vaccine Bivalent Booster 20yrs & up 06/01/2021   Pneumococcal Conjugate-13 10/31/2013   Pneumococcal Polysaccharide-23 12/05/1997, 11/05/2007, 04/07/2015   Respiratory Syncytial Virus Vaccine,Recomb Aduvanted(Arexvy) 09/20/2022   Rsv, Bivalent, Protein Subunit Rsvpref,pf Verdis Frederickson) 09/20/2022   Td 09/07/2003   Td (Adult),unspecified 09/07/2003   Tdap 08/07/2015, 08/17/2017   Zoster Recombinant(Shingrix) 08/17/2017, 06/28/2019, 09/05/2019   Zoster, Live 11/05/2010        Objective:     BP 110/60 (BP Location: Left Arm, Cuff Size: Normal)   Pulse 72   Temp (!) 96.9 F (36.1 C)   Ht 5\' 1"  (1.549 m)   Wt 100 lb (45.4 kg)   SpO2 94%   BMI 18.89 kg/m   SpO2: 94 % O2 Device: None (Room air) O2 Flow Rate (L/min): 4 L/min O2 Type: Continuous O2  GENERAL: Well-developed, thin, elderly woman in no acute distress, mild tachypnea, she presents in transport chair.  Wearing oxygen at 4 L/min via nasal cannula. HEAD: Normocephalic, atraumatic.  EYES: Pupils equal, round, reactive to light.  No scleral  icterus.  MOUTH: Oral mucosa moist.  No thrush NECK: Supple. No thyromegaly. Trachea midline.  Significant JVD.  No adenopathy. PULMONARY: Distant breath sounds, no adventitious sounds. CARDIOVASCULAR: S1 and S2. Regular rate and rhythm.  Previously noted murmur not appreciated.   GASTROINTESTINAL: Benign. MUSCULOSKELETAL: No joint deformity, no clubbing, no edema LE's.  Significant sarcopenia. NEUROLOGIC: No focal deficit.  Speech is fluent.  SKIN: Intact,warm,dry.  Limited exam no rashes PSYCH: Mood and behavior appropriate.     Assessment & Plan:     ICD-10-CM   1. COPD with chronic bronchitis and emphysema (HCC)  J44.89 albuterol (  VENTOLIN HFA) 108 (90 Base) MCG/ACT inhaler   J43.9    Will switch to DuoNebs 4 times a day as needed As needed albuterol    2. Chronic respiratory failure with hypoxia (HCC)  J96.11    Patient compliant with oxygen Notes benefit of therapy Continue same    3. Severe pulmonary hypertension (HCC)  I27.20    Treating symptomatically Tyvaso and tadalafil discontinued Management will be mostly volume management    4. Adenocarcinoma of right lung, stage 4 [facility])  C34.91    Diagnosed May 2024 Second lung primary Has entered hospice care     Meds ordered this encounter  Medications   albuterol (VENTOLIN HFA) 108 (90 Base) MCG/ACT inhaler    Sig: INHALE 2 PUFFS BY MOUTH EVERY 6 HOURS AS NEEDED FOR WHEEZE OR SHORTNESS OF BREATH    Dispense:  8.5 each    Refill:  5   We will see the patient in follow-up on an as-needed basis.  Will remain available to answer questions from hospice staff as needed.   Gailen Shelter, MD Advanced Bronchoscopy PCCM [facility]   *This note was dictated using voice recognition software/Dragon.  Despite best efforts to proofread, errors can occur which can change the meaning. Any transcriptional errors that result from this process are unintentional and may not be fully corrected at the time of  dictation.

## 2023-05-02 ENCOUNTER — Other Ambulatory Visit: Payer: Self-pay | Admitting: Nurse Practitioner

## 2023-05-02 DIAGNOSIS — E119 Type 2 diabetes mellitus without complications: Secondary | ICD-10-CM

## 2023-05-05 ENCOUNTER — Encounter: Payer: Self-pay | Admitting: Cardiology

## 2023-05-05 ENCOUNTER — Encounter: Payer: Self-pay | Admitting: Nurse Practitioner

## 2023-05-05 ENCOUNTER — Encounter: Payer: Self-pay | Admitting: Pulmonary Disease

## 2023-05-06 ENCOUNTER — Other Ambulatory Visit (HOSPITAL_COMMUNITY): Payer: Medicare Other

## 2023-05-08 DEATH — deceased

## 2023-05-24 ENCOUNTER — Other Ambulatory Visit: Payer: Self-pay | Admitting: Nurse Practitioner

## 2023-08-02 ENCOUNTER — Other Ambulatory Visit: Payer: Medicare Other

## 2023-08-08 ENCOUNTER — Other Ambulatory Visit: Payer: Medicare Other

## 2023-08-09 ENCOUNTER — Other Ambulatory Visit: Payer: Medicare Other

## 2023-12-26 IMAGING — CT CT CHEST W/O CM
2 of 5 series · 14 of 36 positions shown, 17 images · non-contrast
Comparison: Chest radiographs, most recent dated 12/24/2021. Chest
CTs, 01/16/2021, 06/25/2020, 02/29/2020 and 09/27/2019.

CLINICAL DATA: Hx of lung cancer s/p RLL resection and SBRT upper
lobe nodule

Hx Squamous cell carcinoma and uterineHx of COPD, DM Pt on Home
GIHormer smoker
EXAM:
CT CHEST WITHOUT CONTRAST
TECHNIQUE: Multidetector CT imaging of the chest was performed following the
standard protocol without IV contrast.
RADIATION DOSE REDUCTION: This exam was performed according to the
departmental dose-optimization program which includes automated
exposure control, adjustment of the mA and/or kV according to
patient size and/or use of iterative reconstruction technique.

[Series 4: chest 2.00 br40 s3 · coronal · 0.53mm/px · 3 of 137 slices shown]
[im 28/137  lung]
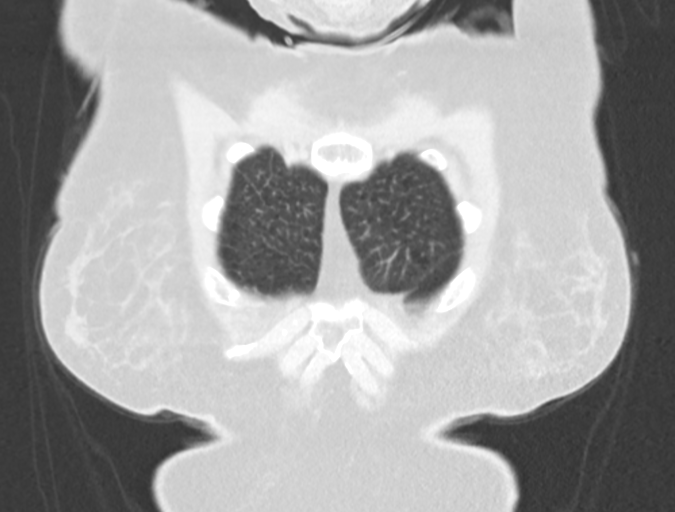
[im 55/137  lung]
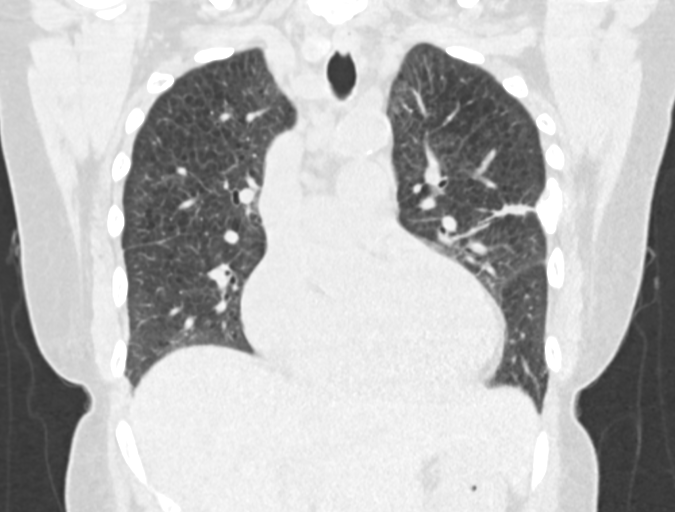
[im 82/137  lung]
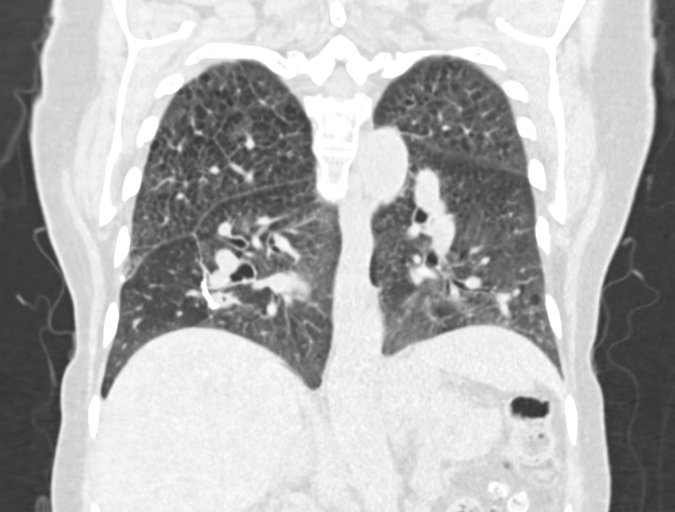

[Series 10: chest 1.00 br40 s3 super d · axial · 0.72mm/px · z∈[+1676,+1912]mm · 11 of 341 slices shown, 14 images]
[im 23/341  mediastinal]
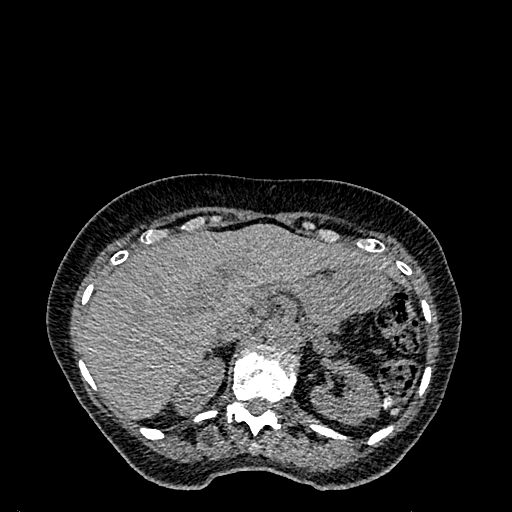
[im 23/341  lung]
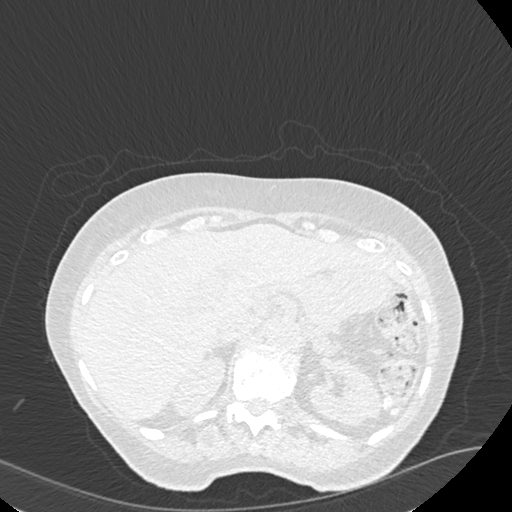
[im 46/341  lung]
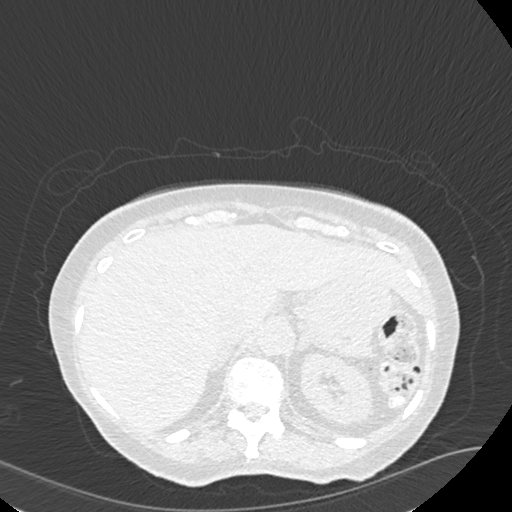
[im 91/341  lung]
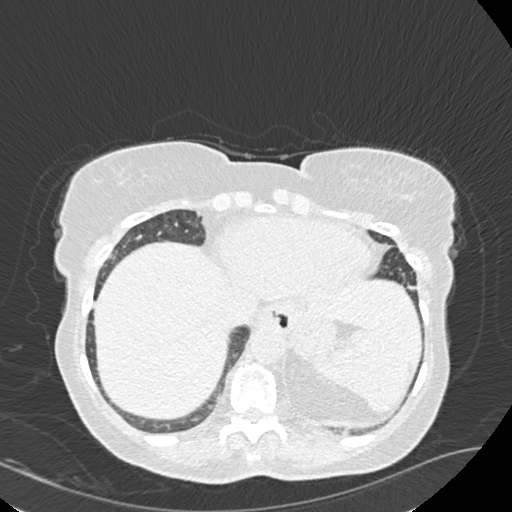
[im 114/341  lung]
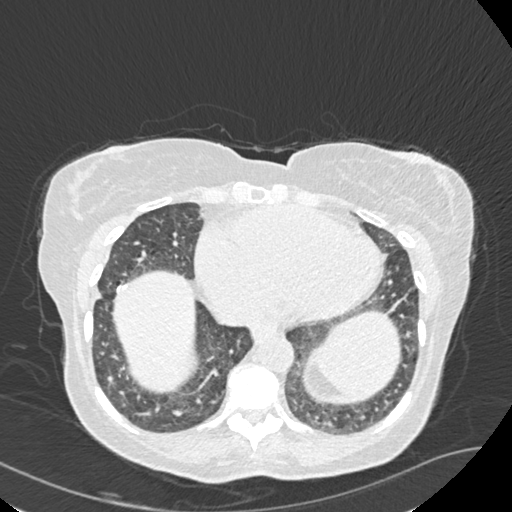
[im 137/341  mediastinal]
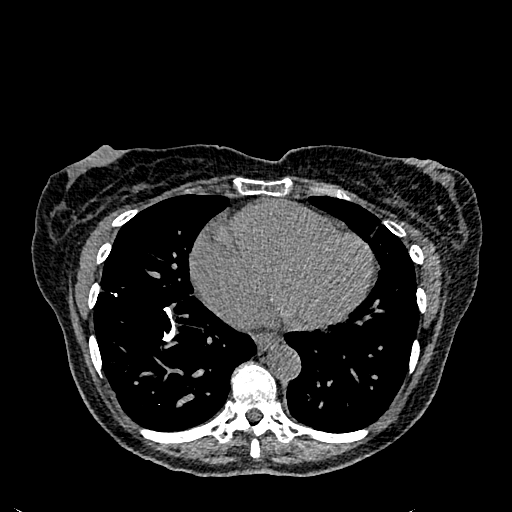
[im 137/341  lung]
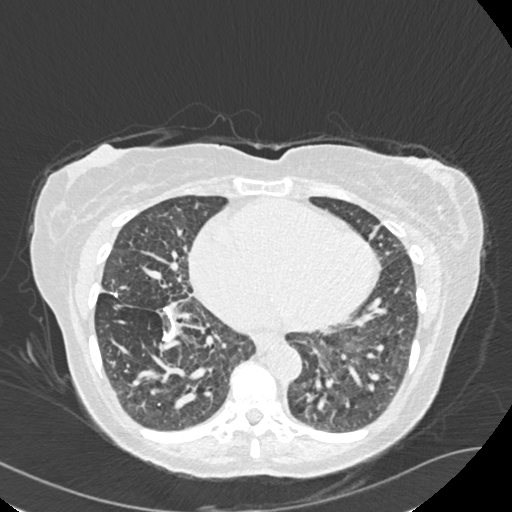
[im 182/341  lung]
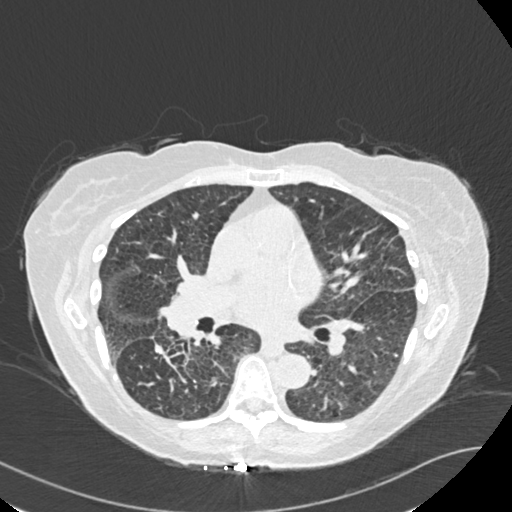
[im 205/341  lung]
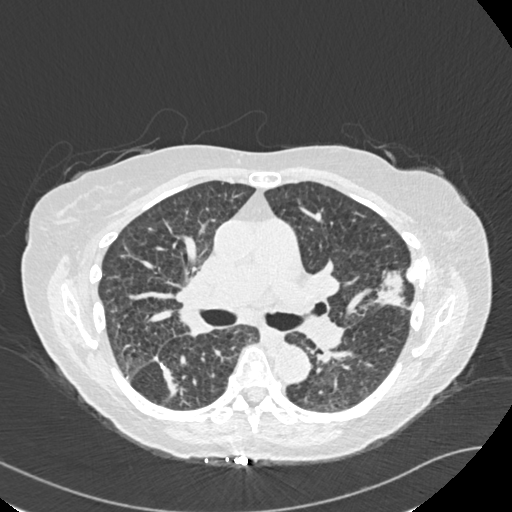
[im 227/341  lung]
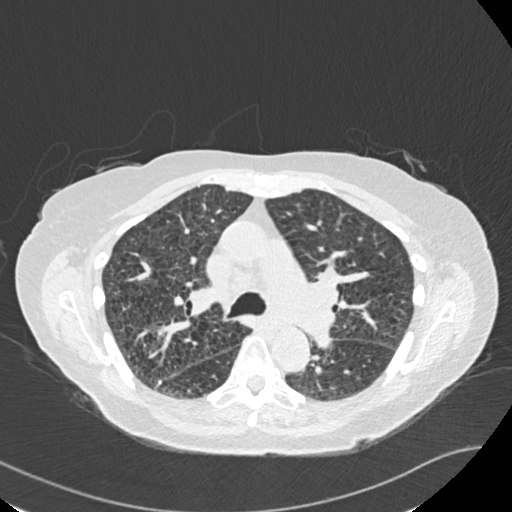
[im 250/341  mediastinal]
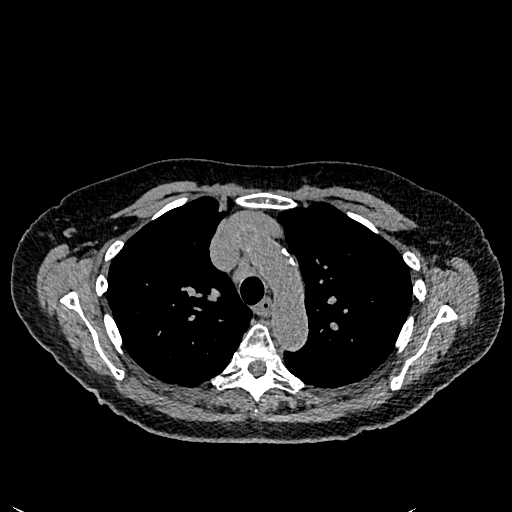
[im 250/341  lung]
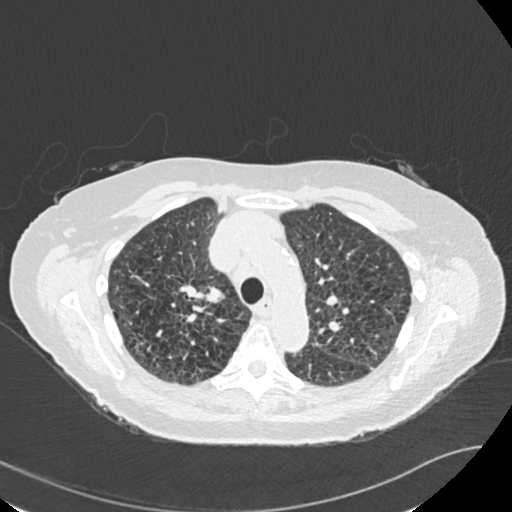
[im 295/341  lung]
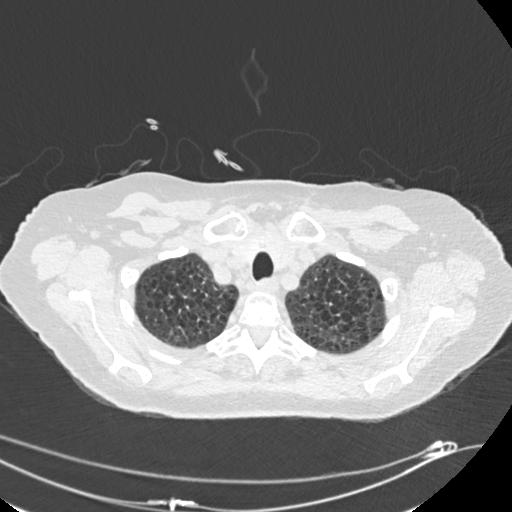
[im 318/341  lung]
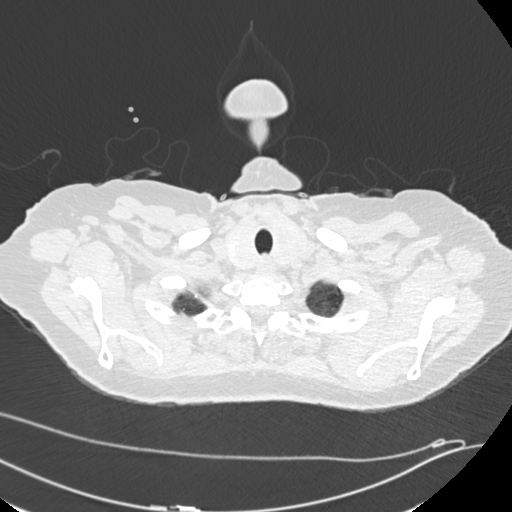

[14 of 36 positions shown; findings below may reference images not displayed]

FINDINGS: Cardiovascular: Heart normal in size. Left and circumflex coronary
artery calcifications. No pericardial effusion. Minor aortic
atherosclerosis. Enlarged right and left pulmonary arteries, right
2.7 cm, left 2.8 cm.

Mediastinum/Nodes: Prominent mediastinal and hilar lymph nodes,
stable from the prior CT. No mediastinal or hilar mass. Stable
appearance of the thyroid, assessed with thyroid ultrasound on
03/27/2021.

Lungs/Pleura: Stable left upper lobe, lingula irregular discoid
opacity, consistent with post radiation scarring, adjacent to a
chronic appearing, probable pathologic, left lateral fifth rib
fracture.

Postsurgical changes in the right lower lobe with adjacent
nodular/discoid scarring, stable.

Advanced emphysema. No evidence of pneumonia or pulmonary edema. No
pleural effusion or pneumothorax.

Upper Abdomen: No acute findings. Nodular thickening of the left
adrenal gland consistent with hyperplasia, stable.

Musculoskeletal: Left lateral fifth rib fracture, with adjacent
callus formation, unchanged. No other fracture. No bone lesion.
IMPRESSION: 1. Stable appearance of the chest CT, without evidence of local
carcinoma recurrence or active metastatic disease.
2. Discoid type opacity in the left upper lobe lingula is consistent
with radiation induced scarring, unchanged. Stable postsurgical
changes in the right lower lobe.
3. Borderline mediastinal hilar lymph nodes also stable.
4. Advanced emphysema.

Aortic Atherosclerosis (VDY8X-8DM.M) and Emphysema (VDY8X-A3K.M).
